# Patient Record
Sex: Male | Born: 1955 | Race: Black or African American | Hispanic: No | Marital: Single | Smoking: Light tobacco smoker
Health system: Southern US, Community
[De-identification: ages and names within clinical notes are randomized; demographics above are authoritative.]

## PROBLEM LIST (undated history)

## (undated) DIAGNOSIS — I10 Essential (primary) hypertension: Secondary | ICD-10-CM

## (undated) DIAGNOSIS — N189 Chronic kidney disease, unspecified: Secondary | ICD-10-CM

## (undated) DIAGNOSIS — E7800 Pure hypercholesterolemia, unspecified: Secondary | ICD-10-CM

## (undated) DIAGNOSIS — G931 Anoxic brain damage, not elsewhere classified: Secondary | ICD-10-CM

## (undated) DIAGNOSIS — N179 Acute kidney failure, unspecified: Secondary | ICD-10-CM

## (undated) DIAGNOSIS — I219 Acute myocardial infarction, unspecified: Secondary | ICD-10-CM

## (undated) DIAGNOSIS — I739 Peripheral vascular disease, unspecified: Secondary | ICD-10-CM

## (undated) DIAGNOSIS — J151 Pneumonia due to Pseudomonas: Secondary | ICD-10-CM

## (undated) DIAGNOSIS — E119 Type 2 diabetes mellitus without complications: Secondary | ICD-10-CM

## (undated) DIAGNOSIS — J9621 Acute and chronic respiratory failure with hypoxia: Secondary | ICD-10-CM

## (undated) DIAGNOSIS — I2510 Atherosclerotic heart disease of native coronary artery without angina pectoris: Secondary | ICD-10-CM

## (undated) HISTORY — PX: CORONARY ANGIOPLASTY: SHX604

## (undated) HISTORY — PX: NO PAST SURGERIES: SHX2092

## (undated) HISTORY — PX: TONSILLECTOMY: SUR1361

## (undated) HISTORY — DX: Peripheral vascular disease, unspecified: I73.9

## (undated) HISTORY — PX: CORONARY ANGIOPLASTY WITH STENT PLACEMENT: SHX49

---

## 2001-12-11 ENCOUNTER — Emergency Department (HOSPITAL_COMMUNITY): Admission: EM | Admit: 2001-12-11 | Discharge: 2001-12-11 | Payer: Self-pay | Admitting: *Deleted

## 2004-05-14 ENCOUNTER — Emergency Department (HOSPITAL_COMMUNITY): Admission: EM | Admit: 2004-05-14 | Discharge: 2004-05-14 | Payer: Self-pay | Admitting: Emergency Medicine

## 2004-05-19 ENCOUNTER — Ambulatory Visit: Payer: Self-pay | Admitting: Internal Medicine

## 2011-11-28 ENCOUNTER — Emergency Department (HOSPITAL_COMMUNITY)
Admission: EM | Admit: 2011-11-28 | Discharge: 2011-11-28 | Disposition: A | Discharge status: Home / Self Care | Payer: Self-pay | Attending: Emergency Medicine | Admitting: Emergency Medicine

## 2011-11-28 ENCOUNTER — Encounter (HOSPITAL_COMMUNITY): Payer: Self-pay | Admitting: *Deleted

## 2011-11-28 ENCOUNTER — Emergency Department (HOSPITAL_COMMUNITY): Payer: Self-pay

## 2011-11-28 DIAGNOSIS — S6290XA Unspecified fracture of unspecified wrist and hand, initial encounter for closed fracture: Secondary | ICD-10-CM

## 2011-11-28 DIAGNOSIS — S62309A Unspecified fracture of unspecified metacarpal bone, initial encounter for closed fracture: Secondary | ICD-10-CM | POA: Insufficient documentation

## 2011-11-28 DIAGNOSIS — Y9289 Other specified places as the place of occurrence of the external cause: Secondary | ICD-10-CM | POA: Insufficient documentation

## 2011-11-28 DIAGNOSIS — S139XXA Sprain of joints and ligaments of unspecified parts of neck, initial encounter: Secondary | ICD-10-CM | POA: Insufficient documentation

## 2011-11-28 DIAGNOSIS — M542 Cervicalgia: Secondary | ICD-10-CM | POA: Insufficient documentation

## 2011-11-28 DIAGNOSIS — X503XXA Overexertion from repetitive movements, initial encounter: Secondary | ICD-10-CM | POA: Insufficient documentation

## 2011-11-28 DIAGNOSIS — S161XXA Strain of muscle, fascia and tendon at neck level, initial encounter: Secondary | ICD-10-CM

## 2011-11-28 MED ORDER — NAPROXEN 500 MG PO TABS: 500.0000 mg | ORAL_TABLET | Freq: Two times a day (BID) | ORAL | Status: DC

## 2011-11-28 MED ORDER — HYDROCODONE-ACETAMINOPHEN 5-325 MG PO TABS: 1.0000 | ORAL_TABLET | Freq: Four times a day (QID) | ORAL | Status: AC | PRN

## 2011-11-28 NOTE — Progress Notes (Signed)
Orthopedic Tech Progress Note Patient Details:  Joel Hebert 08/13/1955 [id number]  Type of Splint: Volar;Other (comment) (foam arm sling) Splint Location: right hand/arm Splint Interventions: Application    Nikki Dom 11/28/2011, 6:29 PM

## 2011-11-28 NOTE — ED Provider Notes (Signed)
History   This chart was scribed for Shelda Jakes, MD by Brooks Sailors. The patient was seen in room STRE5/STRE5. Patient's care was started at 1421.   CSN: [id number]  Arrival date & time 11/28/11  1421   First MD Initiated Contact with Patient 11/28/11 1451      Chief Complaint  Patient presents with  . Joint Swelling    (Consider location/radiation/quality/duration/timing/severity/associated sxs/prior treatment) HPI Joel Hebert is a 56 y.o. male who presents to the Emergency Department complaining of constant Bilateral hand pain onset two months ago and persistent since with some swelling in the right hand. Pt says that he does a lot of heavy lifting at work and recently fell causing some neck pain. Denies history of arthritis. Pt says he was pulling on some metal when he slipped and fell and hurt his neck. Says it has been stiff ever since.     History reviewed. No pertinent past medical history.  History reviewed. No pertinent past surgical history.  No family history on file.  History  Substance Use Topics  . Smoking status: Current Everyday Smoker  . Smokeless tobacco: Not on file  . Alcohol Use: Yes     occ      Review of Systems  Constitutional: Negative for fever and chills.  HENT: Positive for neck pain. Negative for congestion and sore throat.   Respiratory: Negative for shortness of breath.   Cardiovascular: Negative for chest pain.  Gastrointestinal: Negative for nausea, vomiting, abdominal pain and diarrhea.  Genitourinary: Negative for dysuria and hematuria.  Musculoskeletal: Positive for joint swelling. Negative for myalgias and back pain.  Skin: Negative for rash.  Neurological: Negative for headaches.  All other systems reviewed and are negative.    Allergies  Review of patient's allergies indicates no known allergies.  Home Medications   Current Outpatient Rx  Name Route Sig Dispense Refill  . HYDROCODONE-ACETAMINOPHEN  5-325 MG PO TABS Oral Take 1-2 tablets by mouth every 6 (six) hours as needed for pain. 10 tablet 0  . NAPROXEN 500 MG PO TABS Oral Take 1 tablet (500 mg total) by mouth 2 (two) times daily. 14 tablet 0    BP 132/65  Pulse 84  Temp(Src) 98.3 F (36.8 C) (Oral)  Resp 18  SpO2 99%  Physical Exam  Nursing note and vitals reviewed. Constitutional: He is oriented to person, place, and time. He appears well-developed and well-nourished.  HENT:  Head: Normocephalic and atraumatic.  Eyes: Conjunctivae and EOM are normal. Pupils are equal, round, and reactive to light.  Neck: Normal range of motion. Neck supple.  Cardiovascular: Normal rate and regular rhythm.   Pulmonary/Chest: Effort normal and breath sounds normal.  Abdominal: Soft. Bowel sounds are normal.  Musculoskeletal: Normal range of motion.       Tenderness at bilateral MP joints, Cap refil 2 seconds or less both hands, Swelling palm of right hand, Radial pulse left and right is 2+.   Neurological: He is alert and oriented to person, place, and time.  Skin: Skin is warm and dry.  Psychiatric: He has a normal mood and affect.    ED Course  Procedures (including critical care time) Pt seen at 1513   Labs Reviewed - No data to display Dg Cervical Spine With Flex & Extend  11/28/2011  *RADIOLOGY REPORT*  Clinical Data: Neck pain.  CERVICAL SPINE COMPLETE WITH FLEXION AND EXTENSION VIEWS  Comparison: None.  Findings: Anterior spurring at C2-3, C4-5 and C5-6.  Disc space narrowing at C5-6.  Normal alignment.  No instability with flexion or extension.  No neural foraminal narrowing.  No fracture. Prevertebral soft tissues are normal.  IMPRESSION: Spondylosis.  No acute findings.  No instability.  Original Report Authenticated By: Cyndie Chime, M.D.   Dg Hand Complete Left  11/28/2011  *RADIOLOGY REPORT*  Clinical Data: Bilateral hand pain, fell March 2013  LEFT HAND - COMPLETE 3+ VIEW  Comparison: None  Findings: Osseous  mineralization grossly normal. Degenerative changes at IP joint left thumb joint space narrowing and spur formation. Remaining joint spaces preserved. No acute fracture, dislocation or bone destruction.  IMPRESSION: Degenerative changes IP joint left thumb. No definite acute bony abnormalities.  Original Report Authenticated By: Lollie Marrow, M.D.   Dg Hand Complete Right  11/28/2011  *RADIOLOGY REPORT*  Clinical Data: Fall.  Bilateral hand pain.  RIGHT HAND - COMPLETE 3+ VIEW  Comparison: None.  Findings: There are fractures noted at the base of the right fourth proximal phalanx and likely within the distal right third metacarpal.  Radiopaque density within the soft tissues at the tip of the right ring finger, age indeterminate.  Mild degenerative changes in the DIP joints and first carpal metacarpal joint.  IMPRESSION: Nondisplaced fractures within the distal right third metacarpal and base of the right fourth proximal phalanx.  Age indeterminate metallic foreign body in the tip of the right ring finger.  Original Report Authenticated By: Cyndie Chime, M.D.     1. Hand fracture   2. Cervical strain       MDM  Finding of fractures to the right hand without a history of trauma.  They have been splinted up with an ulnar gutter type splint and hand surgery referral provided.  Also workup of the cervical strain.  Her neck pain.  That occurred several weeks ago on the job shows no evidence of any bony injury or flexion and extension.  No obvious ligamental injury, our symptoms persist, patient may require MRI orthopedics consult with this.  Also, patient needs to talk to his workplace about possible workman compensation claim.      I personally performed the services described in this documentation, which was scribed in my presence. The recorded information has been reviewed and considered.     Shelda Jakes, MD 11/28/11 [phone]

## 2011-11-28 NOTE — ED Notes (Signed)
Pt has been having swelling to hands over the last 2 months and reports they throb.  Right hand is worse than left

## 2011-11-28 NOTE — Discharge Instructions (Signed)
Cast or Splint Care Casts and splints support injured limbs and keep bones from moving while they heal.  HOME CARE  Keep the cast or splint uncovered during the drying period.   A plaster cast can take 24 to 48 hours to dry.   A fiberglass cast will dry in less than 1 hour.   Do not rest the cast on anything harder than a pillow for 24 hours.   Do not put weight on your injured limb. Do not put pressure on the cast. Wait for your doctor's approval.   Keep the cast or splint dry.   Cover the cast or splint with a plastic bag during baths or wet weather.   If you have a cast over your chest and belly (trunk), take sponge baths until the cast is taken off.   Keep your cast or splint clean. Wash a dirty cast with a damp cloth.   Do not put any objects under your cast or splint. Do not scratch the skin under the cast with an object.   Do not take out the padding from inside your cast.   Exercise your joints near the cast as told by your doctor.   Raise (elevate) your injured limb on 1 or 2 pillows for the first 1 to 3 days.  GET HELP RIGHT AWAY IF:  Your cast or splint cracks.   Your cast or splint is too tight or too loose.   You itch badly under the cast.   Your cast gets wet or has a soft spot.   You have a bad smell coming from the cast.   You get an object stuck under the cast.   Your skin around the cast becomes red or raw.   You have new or more pain after the cast is put on.   You have fluid leaking through the cast.   You cannot move your fingers or toes.   Your fingers or toes turn colors or are cool, painful, or puffy (swollen).   You have tingling or lose feeling (numbness) around the injured area.   You have pain or pressure under the cast.   You have trouble breathing or have shortness of breath.   You have chest pain.  MAKE SURE YOU:  Understand these instructions.   Will watch your condition.   Will get help right away if you are not doing  well or get worse.  Document Released: 10/26/2010 Document Revised: 06/15/2011 Document Reviewed: 10/26/2010 [facility] Patient Information 2012 [place], [place].Cervical Sprain A cervical sprain is when the ligaments in the neck stretch or tear. The ligaments are the tissues that hold the neck bones in place. HOME CARE   Put ice on the injured area.   Put ice in a plastic bag.   Place a towel between your skin and the bag.   Leave the ice on for 15 to 20 minutes, 3 to 4 times a day.   Only take medicine as told by your doctor.   Keep all doctor visits as told.   Keep all physical therapy visits as told.   If your doctor gives you a neck collar, wear it as told.   Do not drive while wearing a neck collar.   Adjust your work station so that you have good posture while you work.   Avoid positions and activities that make your problems worse.   Warm up and stretch before being active.  GET HELP RIGHT AWAY IF:   You  are bleeding or your stomach is upset.   You have an allergic reaction to your medicine.   Your problems (symptoms) get worse.   You develop new problems.   You lose feeling (numbness) or you cannot move (paralysis) any part of your body.   You have tingling or weakness in any part of your body.   Your pain is not controlled with medicine.   You cannot take less pain medicine over time as planned.   Your activity level does not improve as expected.  MAKE SURE YOU:   Understand these instructions.   Will watch your condition.   Will get help right away if you are not doing well or get worse.  Document Released: 12/13/2007 Document Revised: 06/15/2011 Document Reviewed: 03/30/2011 [facility] Patient Information 2012 [place], [place].Cervical Sprain A cervical sprain is when the ligaments in the neck stretch or tear. The ligaments are the tissues that hold the neck bones in place. HOME CARE  Put ice on the injured area.  Put ice in a plastic bag.  Place a  towel between your skin and the bag.  Leave the ice on for 15 to 20 minutes, 3 to 4 times a day.  Only take medicine as told by your doctor.  Keep all doctor visits as told.  Keep all physical therapy visits as told.  If your doctor gives you a neck collar, wear it as told.  Do not drive while wearing a neck collar.  Adjust your work station so that you have good posture while you work.  Avoid positions and activities that make your problems worse.  Warm up and stretch before being active.  GET HELP RIGHT AWAY IF:  You are bleeding or your stomach is upset.  You have an allergic reaction to your medicine.  Your problems (symptoms) get worse.  You develop new problems.  You lose feeling (numbness) or you cannot move (paralysis) any part of your body.  You have tingling or weakness in any part of your body.  Your pain is not controlled with medicine.  You cannot take less pain medicine over time as planned.  Your activity level does not improve as expected.  MAKE SURE YOU:  Understand these instructions.  Will watch your condition.  Will get help right away if you are not doing well or get worse.  Document Released: 12/13/2007 Document Revised: 06/15/2011 Document Reviewed: 03/30/2011 [facility] Patient Information 2012 [place], [place].   Keep splint in place, and dry.  Use sling as needed.  For comfort.  Call orthopedic hand surgery for followup.  Take Naprosyn as needed and supplement with hydrocodone for more severe pain.  No use of your right hand until seen by hand surgery were given.  Note for that.  For the cervical strain.  Recommend followup with orthopedics Dr. were places that injury occurred at work.  Today's x-rays without any evidence of fracture or obvious ligamental injury

## 2012-03-05 ENCOUNTER — Emergency Department (HOSPITAL_COMMUNITY)
Admission: EM | Admit: 2012-03-05 | Discharge: 2012-03-05 | Disposition: A | Discharge status: Home / Self Care | Payer: Self-pay | Attending: Emergency Medicine | Admitting: Emergency Medicine

## 2012-03-05 ENCOUNTER — Encounter (HOSPITAL_COMMUNITY): Payer: Self-pay | Admitting: Emergency Medicine

## 2012-03-05 DIAGNOSIS — S1096XA Insect bite of unspecified part of neck, initial encounter: Secondary | ICD-10-CM | POA: Insufficient documentation

## 2012-03-05 DIAGNOSIS — L0390 Cellulitis, unspecified: Secondary | ICD-10-CM

## 2012-03-05 DIAGNOSIS — W57XXXA Bitten or stung by nonvenomous insect and other nonvenomous arthropods, initial encounter: Secondary | ICD-10-CM | POA: Insufficient documentation

## 2012-03-05 DIAGNOSIS — F17200 Nicotine dependence, unspecified, uncomplicated: Secondary | ICD-10-CM | POA: Insufficient documentation

## 2012-03-05 MED ORDER — CLINDAMYCIN HCL 300 MG PO CAPS: 300.0000 mg | ORAL_CAPSULE | Freq: Three times a day (TID) | ORAL | Status: AC

## 2012-03-05 MED ORDER — ACETAMINOPHEN 325 MG PO TABS
650.0000 mg | ORAL_TABLET | Freq: Once | ORAL | Status: AC
  Administered 2012-03-05: 650 mg via ORAL
  Filled 2012-03-05: qty 2

## 2012-03-05 MED ORDER — CLINDAMYCIN HCL 150 MG PO CAPS
300.0000 mg | ORAL_CAPSULE | Freq: Once | ORAL | Status: AC
  Administered 2012-03-05: 300 mg via ORAL
  Filled 2012-03-05: qty 2

## 2012-03-05 NOTE — ED Provider Notes (Signed)
History   This chart was scribed for Gerhard Munch, MD by Melba Coon. The patient was seen in room TR05C/TR05C and the patient's care was started at 2:31PM.    CSN: [id number]  Arrival date & time 03/05/12  1333   First MD Initiated Contact with Patient 03/05/12 1350      Chief Complaint  Patient presents with  . Insect Bite  . Nose Problem    The history is provided by the patient. No language interpreter was used.   Joel Hebert is a 56 y.o. male who presents to the Emergency Department complaining of persistent, moderate nose swelling and erythema with an onset 2 days ago. Pt states that the present symptoms may be from a spider bite. However, pt did not see what bit him. No pain meds have been taken. Swallowing, vision and behavior nml compared to baseline. HA present. No fever, neck pain, sore throat, rash, back pain, CP, SOB, abd pain, n/v/d, dysuria, or extremity pain, edema, weakness, numbness, or tingling. No known allergies. No other pertinent medical symptoms.   History reviewed. No pertinent past medical history.  History reviewed. No pertinent past surgical history.  History reviewed. No pertinent family history.  History  Substance Use Topics  . Smoking status: Current Everyday Smoker  . Smokeless tobacco: Not on file  . Alcohol Use: Yes     occ      Review of Systems 10 Systems reviewed and all are negative for acute change except as noted in the HPI.   Allergies  Review of patient's allergies indicates no known allergies.  Home Medications  No current outpatient prescriptions on file.  BP 157/77  Pulse 97  Temp 98.8 F (37.1 C) (Oral)  Resp 18  SpO2 96%  Physical Exam  Nursing note and vitals reviewed. Constitutional: He is oriented to person, place, and time. He appears well-developed and well-nourished.  HENT:  Head: Normocephalic and atraumatic.       Pain starting at the bridge of nose; indurated 2 cm area at distal portion  of nose, scabbed over previously open wound, punctate at the bottom.  Eyes: EOM are normal.  Neck: Normal range of motion. Neck supple.  Cardiovascular: Normal rate, normal heart sounds and intact distal pulses.   Pulmonary/Chest: Effort normal and breath sounds normal.  Abdominal: Bowel sounds are normal. He exhibits no distension. There is no tenderness.  Musculoskeletal: Normal range of motion. He exhibits no edema and no tenderness.  Neurological: He is alert and oriented to person, place, and time. He has normal strength. No cranial nerve deficit or sensory deficit.  Skin: Skin is warm and dry. No rash noted.  Psychiatric: He has a normal mood and affect.    ED Course  Procedures (including critical care time)  DIAGNOSTIC STUDIES: Oxygen Saturation is 96% on room air, adequate by my interpretation.    COORDINATION OF CARE:  2:35PM - tylenol and abx will be Rx for the pt. Pt advised to f/u with ED if symptoms worsen. Pt ready for d/c.   Labs Reviewed - No data to display No results found.   No diagnosis found.    MDM  I personally performed the services described in this documentation, which was scribed in my presence. The recorded information has been reviewed and considered.  The patient presents with 2 days of pain and swelling of the distal nose.  Absent fevers, chills, tachycardia, evidence of distress, and with the patient's description of increasing induration, there  suspicion of early cellulitis.  Patient was treated with oral antibiotics, given return precautions and discharged in stable condition.   Gerhard Munch, MD 03/05/12 1438

## 2012-03-05 NOTE — ED Notes (Signed)
Pt sts redness pain and swelling to nose x 4 days; pt sts thinks may have been bitten by something

## 2012-12-02 ENCOUNTER — Ambulatory Visit (HOSPITAL_COMMUNITY)
Admission: RE | Admit: 2012-12-02 | Discharge: 2012-12-02 | Disposition: A | Discharge status: Home / Self Care | Payer: No Typology Code available for payment source | Source: Ambulatory Visit | Attending: Family Medicine | Admitting: Family Medicine

## 2012-12-02 ENCOUNTER — Other Ambulatory Visit (HOSPITAL_COMMUNITY): Payer: Self-pay | Admitting: Family Medicine

## 2012-12-02 DIAGNOSIS — M25511 Pain in right shoulder: Secondary | ICD-10-CM

## 2014-08-13 DIAGNOSIS — R079 Chest pain, unspecified: Secondary | ICD-10-CM

## 2014-08-13 DIAGNOSIS — F17210 Nicotine dependence, cigarettes, uncomplicated: Secondary | ICD-10-CM

## 2016-01-20 ENCOUNTER — Encounter (INDEPENDENT_AMBULATORY_CARE_PROVIDER_SITE_OTHER): Payer: Self-pay

## 2016-01-20 ENCOUNTER — Encounter (INDEPENDENT_AMBULATORY_CARE_PROVIDER_SITE_OTHER): Payer: Self-pay | Admitting: Cardiovascular Disease

## 2016-01-20 ENCOUNTER — Ambulatory Visit (INDEPENDENT_AMBULATORY_CARE_PROVIDER_SITE_OTHER): Payer: No Typology Code available for payment source | Admitting: Cardiovascular Disease

## 2016-01-20 VITALS — BP 165/96 | HR 89 | Ht 68.0 in | Wt 211.0 lb

## 2016-01-20 DIAGNOSIS — I10 Essential (primary) hypertension: Secondary | ICD-10-CM

## 2016-01-20 DIAGNOSIS — R739 Hyperglycemia, unspecified: Secondary | ICD-10-CM | POA: Insufficient documentation

## 2016-01-20 DIAGNOSIS — Z720 Tobacco use: Secondary | ICD-10-CM

## 2016-01-20 DIAGNOSIS — R079 Chest pain, unspecified: Secondary | ICD-10-CM | POA: Insufficient documentation

## 2016-01-20 HISTORY — DX: Essential (primary) hypertension: I10

## 2016-01-20 HISTORY — DX: Chest pain, unspecified: R07.9

## 2016-01-20 MED ORDER — AMLODIPINE BESYLATE 10 MG PO TABS
5.0000 mg | ORAL_TABLET | Freq: Every day | ORAL | Status: AC
Start: 2016-01-20 — End: 2016-04-19

## 2016-01-20 NOTE — Progress Notes (Signed)
[facility]   Cardiology    Patient Name:  Dustin Leon  Date: 01/20/2016  Patient Number:  [id number]  DOB:  [birth year]  Gender: male    Chief Complaint   Patient presents with   . Hypertension     Started at [facility] 3 days ago.   . Follow-up     Chest pain started 3 days ago,  described as a midsternal pain that lasted for 3 hours, pain level 8/10.       Assessment and Plan  Was in ER with CP and elevated BP ,high BG 223   Was put on Norvasc 5 qd   dced to follow up as outpt   CP was pressure like ,in MS area ,non-radiating ,resolved a few seconds after the 3rd dose of S/L NTG  BP is still high on Norvasc 5 qd  Increase Norvasc 10 qd   Check A1c and Lipids   Ischemic w/u   Complete smoking cessation  Encounter Diagnoses   Name Primary?   . Chest pain, unspecified type Yes   . HTN (hypertension), benign    . Hyperglycemia    . Tobacco abuse         Orders Placed This Encounter   Procedures   . NM Myocardial Perfusion Spect (Stress And Rest)     Standing Status: Future      Number of Occurrences:       Standing Expiration Date: 01/20/2017     Order Specific Question:  Clinical info for radiologist     Answer:  cp   . Hepatic function panel (LFT)     Standing Status: Future      Number of Occurrences:       Standing Expiration Date: 01/20/2017   . Lipid panel     Fasting     Standing Status: Future      Number of Occurrences:       Standing Expiration Date: 01/20/2017     Order Specific Question:  Has the patient fasted?     Answer:  Yes   . Hemoglobin A1C   . Echocardiogram Adult Complete W Clr/ Dopp Waveform     Order Specific Question:  Which group should read the exam?     Answer:  N/A       History of Present Illness   Was in ER with CP and elevated BP ,high BG 223   Was put on Norvasc 5 qd   dced to follow up as outpt   CP was pressure like ,in MS area ,non-radiating ,resolved a few seconds after the 3rd dose of S/L NTG  BP is still high on Norvasc 5 qd  No Known Allergies    Past Medical History   Diagnosis  Date   . Chest pain 01/20/2016   . HTN (hypertension), benign 01/20/2016       Past Surgical History   Procedure Laterality Date   . No past surgeries         Family History   Problem Relation Age of Onset   . Hypertension Mother    . Diabetes Mother    . Kidney disease Mother    . CABG Father 57   . CABG Brother 55     x 3             Medications:   Current Outpatient Prescriptions   Medication Sig Dispense Refill   . amLODIPine (NORVASC) 10 MG tablet Take 0.5 tablets (5 mg total)  by mouth daily. 90 tablet 3     No current facility-administered medications for this visit.       Physical Exam:     Filed Vitals:    01/20/16 1436   BP: 165/96   Pulse: 89   Height: 1.727 m (5\' 8" )   Weight: 95.709 kg (211 lb)   SpO2: 97%   Body surface area is 2.14 meters squared.  Body mass index is 32.09 kg/(m^2).    Constitutional: well developed, nourished, no distress and alert and oriented x 3   HENT: atraumatic, nose normal, normocephalic, left exterior ear normal, right external ear normal and oropharynx clear and moist   Eyes: conjunctiva normal, EOM normal and PERRL   Neck: ROM normal, supple and trachea normal   Vascular WNL, no edema, normal pulses , no cellulitis   Cardiovascular: heart sounds normal, intact distal pulses, normal rate, regular rhythm and no murmur   Pulmonary/Chest Wall: breath sounds normal, effort normal and no rales   Abdominal: appearance normal, bowel sounds normal, soft and non-tender   Musculoskeletal: normal ROM, normal tone and strength   Neurological: awake, alert and oriented x 3 and gait normal, cranial nerves intact, motor and sensory normal   Skin: dry, intact and warm     LABS:  Lipid Panel No results found for: CHOL, TRIG, HDL  CBC No results found for: WBC, HGB, HCT, MCV, PLT   BMPNo results found for: GLUCOSE, SODIUM, POTASSIUM, CHLORIDE, CO2, BUN, CREATININE  INR No results found for: INR, PROTIME    REVIEW OF SYSTEMS: All other systems reviewed and negative except as stated  above.    Electronically signed by: Miachel Roux, MD 01/20/2016 3:11 PM

## 2016-01-27 ENCOUNTER — Encounter (INDEPENDENT_AMBULATORY_CARE_PROVIDER_SITE_OTHER): Payer: Self-pay | Admitting: Cardiovascular Disease

## 2016-01-27 DIAGNOSIS — I214 Non-ST elevation (NSTEMI) myocardial infarction: Secondary | ICD-10-CM

## 2016-01-27 DIAGNOSIS — R079 Chest pain, unspecified: Secondary | ICD-10-CM

## 2016-01-28 DIAGNOSIS — R079 Chest pain, unspecified: Secondary | ICD-10-CM

## 2016-01-28 DIAGNOSIS — R0789 Other chest pain: Secondary | ICD-10-CM

## 2016-01-28 DIAGNOSIS — I214 Non-ST elevation (NSTEMI) myocardial infarction: Secondary | ICD-10-CM

## 2016-01-31 ENCOUNTER — Telehealth (INDEPENDENT_AMBULATORY_CARE_PROVIDER_SITE_OTHER): Payer: Self-pay

## 2016-01-31 NOTE — Telephone Encounter (Signed)
Left VM for patient to call back regarding Nuclear MPI preparation.  ??? Validity of phone number...  [phone] (H)  01/31/2016 @ 1110 mai

## 2016-02-02 ENCOUNTER — Ambulatory Visit (INDEPENDENT_AMBULATORY_CARE_PROVIDER_SITE_OTHER): Payer: No Typology Code available for payment source

## 2016-02-02 ENCOUNTER — Encounter (INDEPENDENT_AMBULATORY_CARE_PROVIDER_SITE_OTHER): Payer: Self-pay

## 2016-02-02 NOTE — Progress Notes (Signed)
Patient arrived for Nuclear MPI test.  He stated to me that he had just had a stent placed.  I verified it was performed at [place], and [place], RN pulled the information.  We showed Dr. Aneta Mins, and explained that since the patient's office visit on 01/20/16, patient went to [facility], and had Cath performed by Dr. Billie Ruddy.  The patient was now here in the office for the previously scheduled Nuclear, and we wanted to verify the medical need at this point. Dr. Aneta Mins confirmed that the exam did not need to be performed.  Patient requested a return to work form, and Dr. Aneta Mins will write one, at the next office visit, this week, overbooked as necessary.

## 2016-02-07 ENCOUNTER — Telehealth (INDEPENDENT_AMBULATORY_CARE_PROVIDER_SITE_OTHER): Payer: Self-pay | Admitting: Cardiovascular Disease

## 2016-02-07 ENCOUNTER — Ambulatory Visit (INDEPENDENT_AMBULATORY_CARE_PROVIDER_SITE_OTHER): Payer: No Typology Code available for payment source | Admitting: Cardiovascular Disease

## 2016-02-07 ENCOUNTER — Encounter (INDEPENDENT_AMBULATORY_CARE_PROVIDER_SITE_OTHER): Payer: Self-pay | Admitting: Cardiovascular Disease

## 2016-02-07 ENCOUNTER — Encounter (INDEPENDENT_AMBULATORY_CARE_PROVIDER_SITE_OTHER): Payer: Self-pay

## 2016-02-07 VITALS — BP 158/81 | HR 76 | Ht 68.0 in | Wt 207.4 lb

## 2016-02-07 DIAGNOSIS — I2510 Atherosclerotic heart disease of native coronary artery without angina pectoris: Secondary | ICD-10-CM

## 2016-02-07 DIAGNOSIS — Z955 Presence of coronary angioplasty implant and graft: Secondary | ICD-10-CM

## 2016-02-07 DIAGNOSIS — E7800 Pure hypercholesterolemia, unspecified: Secondary | ICD-10-CM

## 2016-02-07 DIAGNOSIS — Z720 Tobacco use: Secondary | ICD-10-CM

## 2016-02-07 DIAGNOSIS — I10 Essential (primary) hypertension: Secondary | ICD-10-CM

## 2016-02-07 DIAGNOSIS — I214 Non-ST elevation (NSTEMI) myocardial infarction: Secondary | ICD-10-CM | POA: Insufficient documentation

## 2016-02-07 DIAGNOSIS — E119 Type 2 diabetes mellitus without complications: Secondary | ICD-10-CM | POA: Insufficient documentation

## 2016-02-07 DIAGNOSIS — R739 Hyperglycemia, unspecified: Secondary | ICD-10-CM

## 2016-02-07 DIAGNOSIS — Z95820 Peripheral vascular angioplasty status with implants and grafts: Secondary | ICD-10-CM

## 2016-02-07 DIAGNOSIS — Z959 Presence of cardiac and vascular implant and graft, unspecified: Secondary | ICD-10-CM

## 2016-02-07 HISTORY — DX: Presence of coronary angioplasty implant and graft: Z95.5

## 2016-02-07 HISTORY — DX: Peripheral vascular angioplasty status with implants and grafts: Z95.820

## 2016-02-07 HISTORY — DX: Non-ST elevation (NSTEMI) myocardial infarction: I21.4

## 2016-02-07 HISTORY — DX: Atherosclerotic heart disease of native coronary artery without angina pectoris: I25.10

## 2016-02-07 MED ORDER — LISINOPRIL 10 MG PO TABS
10.0000 mg | ORAL_TABLET | Freq: Every day | ORAL | Status: DC
Start: 2016-02-07 — End: 2016-04-06

## 2016-02-07 NOTE — Progress Notes (Signed)
[facility]   Cardiology    Patient Name:  Dustin Leon  Date: 02/07/2016  Patient Number:  [id number]  DOB:  [birth year]  Gender: male    Chief Complaint   Patient presents with   . Hypertension     increased Norvac at last visit.    . Coronary Artery Disease     Post NSTEMI and stent 1 week ago, DES to the LAD.        Assessment and Plan  Had recurrent CP with ACS ,had DES to OM ,EF 55%  No recurrent CP  Still smokes some cigar  Is compliant with meds  Cardiac rehab  Complete smoking cessation  Increase Lisinoprl 5 to 10 qd   Encounter Diagnoses   Name Primary?   . HTN (hypertension), benign Yes   . Tobacco abuse  Smoking cessation was discussed with patient and provided information    . Coronary artery disease involving native coronary artery of native heart without angina pectoris  CAD Therapeutic goals discussed with patient and provided information      . S/P angioplasty with stent    . NSTEMI (non-ST elevated myocardial infarction)    . Presence of stent in LAD coronary artery    . Presence of drug coated stent in left circumflex coronary artery    . Hyperglycemia         Orders Placed This Encounter   Procedures   . Referral to Cardiac Rehabilitation     Referral Priority:  Routine     Referral Type:  Rehabilitation     Referral Reason:  Specialty Services Required     Requested Specialty:  Cardiology     Number of Visits Requested:  1   . ECG 12 lead (Today)       History of Present Illness   Had recurrent CP with ACS ,had DES to OM ,EF 55%  No recurrent CP  Still smokes some cigar  Is compliant with meds    No Known Allergies    Past Medical History   Diagnosis Date   . Chest pain 01/20/2016   . HTN (hypertension), benign 01/20/2016   . Coronary artery disease involving native coronary artery of native heart without angina pectoris 02/07/2016   . S/P angioplasty with stent 02/07/2016   . NSTEMI (non-ST elevated myocardial infarction) 02/07/2016   . Presence of stent in LAD coronary artery 02/07/2016        Past Surgical History   Procedure Laterality Date   . No past surgeries     . Coronary angioplasty         Family History   Problem Relation Age of Onset   . Hypertension Mother    . Diabetes Mother    . Kidney disease Mother    . CABG Father 37   . CABG Brother 55     x 3       No images are attached to the encounter.    Medications:   Current Outpatient Prescriptions   Medication Sig Dispense Refill   . amLODIPine (NORVASC) 10 MG tablet Take 0.5 tablets (5 mg total) by mouth daily. (Patient taking differently: Take 10 mg by mouth daily.    ) 90 tablet 3   . aspirin (GOODSENSE ASPIRIN) 81 MG chewable tablet Chew 81 mg by mouth daily.         Marland Kitchen atorvastatin (LIPITOR) 40 MG tablet Take 40 mg by mouth daily.         Marland Kitchen  glipiZIDE (GLUCOTROL) 10 MG tablet Take 10 mg by mouth 2 (two) times daily before meals.         Marland Kitchen lisinopril (PRINIVIL,ZESTRIL) 10 MG tablet Take 1 tablet (10 mg total) by mouth daily. 90 tablet 3   . metFORMIN (GLUCOPHAGE) 850 MG tablet Take 850 mg by mouth 2 (two) times daily with meals.         . metoprolol (LOPRESSOR) 25 MG tablet Take 25 mg by mouth 2 (two) times daily.         . nitroglycerin (NITROSTAT) 0.4 MG SL tablet Place 0.4 mg under the tongue every 5 (five) minutes as needed.         . ticagrelor (BRILINTA) 90 MG Tab Take 90 mg by mouth every 12 (twelve) hours.           No current facility-administered medications for this visit.       Physical Exam:     Filed Vitals:    02/07/16 0850   BP: 158/81   Pulse: 76   Height: 1.727 m (5\' 8" )   Weight: 94.076 kg (207 lb 6.4 oz)   Body surface area is 2.12 meters squared.  Body mass index is 31.54 kg/(m^2).    Constitutional: well developed, nourished, no distress and alert and oriented x 3   HENT: atraumatic, nose normal, normocephalic, left exterior ear normal, right external ear normal and oropharynx clear and moist   Eyes: conjunctiva normal, EOM normal and PERRL   Neck: ROM normal, supple and trachea normal   Vascular WNL, no edema,  normal pulses , no cellulitis   Cardiovascular: heart sounds normal, intact distal pulses, normal rate, regular rhythm and no murmur   Pulmonary/Chest Wall: breath sounds normal, effort normal and no rales   Abdominal: appearance normal, bowel sounds normal, soft and non-tender   Musculoskeletal: normal ROM, normal tone and strength   Neurological: awake, alert and oriented x 3 and gait normal, cranial nerves intact, motor and sensory normal   Skin: dry, intact and warm     LABS:  Lipid Panel No results found for: CHOL, TRIG, HDL  CBC No results found for: WBC, HGB, HCT, MCV, PLT   BMPNo results found for: GLUCOSE, SODIUM, POTASSIUM, CHLORIDE, CO2, BUN, CREATININE  INR No results found for: INR, PROTIME    REVIEW OF SYSTEMS: All other systems reviewed and negative except as stated above.    Electronically signed by: Miachel Roux, MD 02/07/2016 9:36 AM

## 2016-02-07 NOTE — Telephone Encounter (Signed)
Dr. Aneta Mins would like patient enrolled in cardiac rehab at [facility]

## 2016-02-09 ENCOUNTER — Telehealth (INDEPENDENT_AMBULATORY_CARE_PROVIDER_SITE_OTHER): Payer: Self-pay | Admitting: Cardiovascular Disease

## 2016-02-09 ENCOUNTER — Encounter (INDEPENDENT_AMBULATORY_CARE_PROVIDER_SITE_OTHER): Payer: Self-pay

## 2016-02-09 NOTE — Telephone Encounter (Signed)
Order for Cardiac Rehab scanned into Media tab listed as Physician Order. Was unable to label as Cardiac Rehab Order so please be aware that this has been submitted

## 2016-02-10 NOTE — Telephone Encounter (Signed)
Order faxed and scanned     [name]

## 2016-02-17 ENCOUNTER — Encounter (INDEPENDENT_AMBULATORY_CARE_PROVIDER_SITE_OTHER): Payer: Self-pay

## 2016-02-23 ENCOUNTER — Ambulatory Visit (INDEPENDENT_AMBULATORY_CARE_PROVIDER_SITE_OTHER): Payer: No Typology Code available for payment source

## 2016-02-23 DIAGNOSIS — R739 Hyperglycemia, unspecified: Secondary | ICD-10-CM

## 2016-02-23 DIAGNOSIS — I10 Essential (primary) hypertension: Secondary | ICD-10-CM

## 2016-02-23 DIAGNOSIS — R079 Chest pain, unspecified: Secondary | ICD-10-CM

## 2016-03-08 ENCOUNTER — Encounter (INDEPENDENT_AMBULATORY_CARE_PROVIDER_SITE_OTHER): Payer: Self-pay | Admitting: Cardiovascular Disease

## 2016-03-08 ENCOUNTER — Ambulatory Visit (INDEPENDENT_AMBULATORY_CARE_PROVIDER_SITE_OTHER): Payer: No Typology Code available for payment source | Admitting: Cardiovascular Disease

## 2016-03-08 VITALS — BP 132/73 | HR 83 | Ht 68.0 in | Wt 208.0 lb

## 2016-03-08 DIAGNOSIS — I200 Unstable angina: Secondary | ICD-10-CM | POA: Insufficient documentation

## 2016-03-08 DIAGNOSIS — Z955 Presence of coronary angioplasty implant and graft: Secondary | ICD-10-CM

## 2016-03-08 DIAGNOSIS — E7800 Pure hypercholesterolemia, unspecified: Secondary | ICD-10-CM

## 2016-03-08 DIAGNOSIS — I2510 Atherosclerotic heart disease of native coronary artery without angina pectoris: Secondary | ICD-10-CM

## 2016-03-08 DIAGNOSIS — I25118 Atherosclerotic heart disease of native coronary artery with other forms of angina pectoris: Secondary | ICD-10-CM

## 2016-03-08 MED ORDER — TICAGRELOR 90 MG PO TABS
90.0000 mg | ORAL_TABLET | Freq: Two times a day (BID) | ORAL | Status: AC
Start: 2016-03-08 — End: ?

## 2016-03-08 NOTE — Progress Notes (Signed)
Maceo Pro MD [facility]  [place] [facility]  [address] [id number]  [place] [facility] - [facility]  [address] [place]  [address]  [place] [address]  Dept: [phone]  Dept Fax: [phone]  Loc Appt: [phone]  Loc: [phone]  Loc Fax: [phone]      Patient Name:  Dustin Leon  Date: 03/08/2016  Patient Number:  [id number]  DOB:  09-05-1955  Gender: male  Chief Complaint   Patient presents with   . Results     echo       Dear Pedro Earls, Lily Lovings, MD  I had the pleasure of seeing Dustin Leon  followup please see my enclosed note for this patient's management.    Thank you for the opportunity to participate in the care of your patients.  If you have any questions please do not hesitate to contact me.      Assessment:  1. Coronary artery disease of native artery of native heart with stable angina pectoris echo revealed normal LV systolic function   2. Pure hypercholesterolemia statin therapy goal LDL < 70    3. Presence of drug coated stent in left circumflex coronary artery continue brilinta and DAPT dual antiplatelet therapy    4. Coronary artery disease involving native coronary artery of native heart without angina pectoris    5.  angina pectoris stress test continue other meds        Plan:    Orders Placed This Encounter   Procedures   . Exercise Nuclear Stress Test       1. Reviewed Current/Prior ECG, Prior Cardiovasuclar workup reviewed with the patient., Educated regarding Vascular, Structural, and Rhythm Heart Disease States and how that relates to primary and secondary prevention of  heart disease or Counseled importance of weight loss to cardiovascular mortality and effective dieting strategies.    History of Present Illness   Chest Pain  Dustin Leon complains of chest pain. Onset was 1 day ago. Symptoms have improved since that time. The patient's pain is associated with activities. The patient describes the pain as dull, pressure and does not radiate. Patient rates pain as  a 1/10 in intensity. Associated symptoms are: chest pain and chest pressure/discomfort. Aggravating factors are: exercise. Alleviating factors are: rest. Patient's cardiac risk factors are: none. Patient's risk factors for DVT/PE: none. Previous cardiac testing: coronary angiography, date [facility], location stent to the lcx.      Allergies, Problem List, Past medical History, Social History  Family History,  Medications,  Reviewed  @DATE @    No Known Allergies    Past Medical History   Diagnosis Date   . Chest pain 01/20/2016   . HTN (hypertension), benign 01/20/2016   . Coronary artery disease involving native coronary artery of native heart without angina pectoris 02/07/2016   . S/P angioplasty with stent 02/07/2016   . NSTEMI (non-ST elevated myocardial infarction) 02/07/2016   . Presence of stent in LAD coronary artery 02/07/2016       Past Surgical History   Procedure Laterality Date   . No past surgeries     . Coronary angioplasty         Family History   Problem Relation Age of Onset   . Hypertension Mother    . Diabetes Mother    . Kidney disease Mother    . CABG Father 50   . CABG Brother 55     x 3       Medications:   Current Outpatient  Prescriptions   Medication Sig   . amLODIPine (NORVASC) 10 MG tablet Take 0.5 tablets (5 mg total) by mouth daily. (Patient taking differently: Take 10 mg by mouth daily.    )   . aspirin (GOODSENSE ASPIRIN) 81 MG chewable tablet Chew 81 mg by mouth daily.       Marland Kitchen atorvastatin (LIPITOR) 40 MG tablet Take 40 mg by mouth daily.       Marland Kitchen glipiZIDE (GLUCOTROL) 10 MG tablet Take 10 mg by mouth 2 (two) times daily before meals.       Marland Kitchen lisinopril (PRINIVIL,ZESTRIL) 10 MG tablet Take 1 tablet (10 mg total) by mouth daily.   . metFORMIN (GLUCOPHAGE) 850 MG tablet Take 850 mg by mouth 2 (two) times daily with meals.       . metoprolol (LOPRESSOR) 25 MG tablet Take 25 mg by mouth 2 (two) times daily.       . nitroglycerin (NITROSTAT) 0.4 MG SL tablet Place 0.4 mg under the tongue every 5  (five) minutes as needed.       . ticagrelor (BRILINTA) 90 MG Tab Take 1 tablet (90 mg total) by mouth every 12 (twelve) hours.       Physical Exam:   General Appearance:  Alert, well-appearing male in no acute distress.    Vital Signs Reviewed  Vital Signs: BP 132/73 mmHg  Pulse 83  Ht 1.727 m (5\' 8" )  Wt 94.348 kg (208 lb)  BMI 31.63 kg/m2  SpO2 98%   HEENT: Sclera anicteric, conjunctiva without pallor, moist mucous membranes, normal dentition. No arcus.   Neck:  Supple without jugular venous distention. Thyroid nonpalpable. Normal carotid upstroke without bruits.   Chest: clear to auscultation bilaterally without wheezes, rales, or rhonchi  Cardiovascular: regular rhythm, soft heart sounds, S1/S2 normal and no murmurs, gallops or rubs are appreciated  Abdomen: Soft, nontender, nondistended, with normoactive bowel sounds. No organomegaly.  No pulsatile masses, or bruits.   Extremities:  peripheral pulses full and equal, warm, no cyanosis or clubbing and no edema    ROS:   General: no acute distress No weight loss   Skin: No bruising   Respiratory: per HPI  Cardiovascular: per HPI  GI: No blood in the stools. No nausea or vomiting.  Neuro: no focal deficits  Musculoskeletal no arthralgias or joint swelling    Results for orders placed in visit on 01/20/16   Echocardiogram Adult Complete W Clr/ Dopp Waveform    Narrative [facility] - [place]  [address] [place]  [address]                     [1]logo  [place] [address]  [phone]    Location:               [facility] - [place] [place]                                       MRN:    [id number] Date of Service: 02/23/2016  Patient Name:           Dustin Leon, Dustin Leon  DOB:    01/17/1956 Height:          68.0in  Interpreting Physician: Miachel Roux, MD                                                   Gender: M        Weight:          207lb  Sonographer:            Bevelyn Ngo, RDCS                                                     Age:    81       BSA:             2.42m2  Indication:             Chest pain, unspecified typeHTN (hypertension), benignHyperglycemia BP:     158/81  Study Quality: Technically fair.    Referring Physician: MAFISHAHRYAR    _______________________________________________________________________________________________  Echocardiogram Report      Conclusions: 1. Normal left and right ventricular size and function.                 2. Estimated ejection fraction 65%.                 3. Mild concentric left ventricular hypertrophy.                 4. Trace aortic regurgitation.                 5. Trace mitral regurgitation.                 6. Trace tricuspid regurgitation.                 7. no significant change compared to the previous echo done in [place] on 01/28/2016                 8. Technically fair study  Findings:  Left Ventricle:  Normal left ventricular size. Normal left ventricular systolic function.65% No regional wall motion abnormalities noted. Mild concentric left ventricular hypertrophy. Normal left ventricular diastolic function.  Right Ventricle: Normal right ventricular size and function.  Left Atrium:     Normal left atrial size.  Right Atrium:    Normal right atrial size.  Mitral Valve:    Trace mitral regurgitation.  Tricuspid Valve: Trace tricuspid regurgitation. Pulmonary arterial systolic pressure is estimated to be with a right atrial pressure of .  Aortic Valve:    The aortic valve is trileaflet. Mild aortic valve calcification. Trace aortic regurgitation. No aortic stenosis.  Pulmonic Valve:  No significant pulmonic regurgitation.  Aorta:           Aortic root and proximal ascending aorta are normal in size.  Pericardium:     No pericardial effusion.  IVC:             The inferior vena cava is normal in size with normal respirophasic variation.    Electronically signed Miachel Roux, MD 02/23/16 5:15  PM    _______________________________________________________________________________________________  STUDY DATA  _______________________________________________________________________________________________    Location:  [facility] [place] [place]                                       MRN:    [id number]  Patient Name:           Dustin Leon, Dustin Leon                                                       DOB:    January 11, 1956 Date of Service: 02/23/2016  Interpreting Physician: Miachel Roux, MD                                                   Gender: M        Height:          68.0in  Sonographer:            Bevelyn Ngo, RDCS                                                    Age:    60       Weight:          207lb  Indication:             Chest pain, unspecified typeHTN (hypertension), benignHyperglycemia BP:     158/81    ------- Left Ventricle ------ ------------ Aorta            ----------- -------- Mitral Valve -------  LVIDd:     4.47(3.7-5.6)cm  Aortic Root:           2.41(2.3-3.7)cm  LVIDs:     2.9(2.0-3.5)cm  IVSDd:     1.2(0.6-1.1)cm   Prox. Ascending Aorta: 2.56(2.3-3.7)cm  LVPW:      1.2(0.6-1.1)cm   ------------ Aortic Valve     -----------  EF:        65(50-75)%       Peak:                  1.54(<=2.5)m/s  FS:        33%               Peak Grad:             9.48(<=16)mmHg  -------- Left Atrium --------                                           PHT:          61.73ms  LA:        3.18(1.9-4.0)cm                                            MVA by PHT:   3.57(4-6)cm2  AVA(vel):              2.47(2-5)cm2  -------- Right Heart --------  RVIDd:     3.14(0.7-4.1)cm  LVOT Diam:             2.11(1.8-2.4)cm                                LVOT PV:               1.09(0.7-1.1)m/s                                LVOT PG:               4.60mmHg          ------ Tricuspid Valve ------  ----- Diastolic Function ----                                            TR Peak Vel:  2.88m/s  Peak E:    0.86(0.6-1.3)m/s ------------ Pulmonic Valve   ----------- TR Peak Grad:  Peak A:    0.94(<=0.7)m/s                                             RAP:          5(<=5)mmHg  E/A Ratio: 0.91(0.75-1.5)                                             RVSP:         25(<=36)mmHg  DT:        212.31(<=200)ms                                           IVC:          1.2(<=2.1)cm    Report for Dustin Leon [id number] on 02/23/16    References    Visible links       No results found for this or any previous visit.    Electronically signed by: Willaim Sheng, MD 03/08/2016 5:00 PM

## 2016-03-16 ENCOUNTER — Telehealth (INDEPENDENT_AMBULATORY_CARE_PROVIDER_SITE_OTHER): Payer: Self-pay

## 2016-03-16 NOTE — Telephone Encounter (Signed)
Left VM for patient to call back regarding Nuclear MPI preparation.  03/16/2016 @ 1110 mai

## 2016-03-20 ENCOUNTER — Ambulatory Visit (INDEPENDENT_AMBULATORY_CARE_PROVIDER_SITE_OTHER): Payer: No Typology Code available for payment source

## 2016-03-20 ENCOUNTER — Encounter (INDEPENDENT_AMBULATORY_CARE_PROVIDER_SITE_OTHER): Payer: Self-pay

## 2016-03-20 DIAGNOSIS — I25118 Atherosclerotic heart disease of native coronary artery with other forms of angina pectoris: Secondary | ICD-10-CM

## 2016-03-20 MED ORDER — TECHNETIUM TC 99M TETROFOSMIN INJECTION
1.0000 | Freq: Once | Status: AC | PRN
Start: 2016-03-20 — End: 2016-03-20
  Administered 2016-03-20: 1 via INTRAVENOUS

## 2016-03-24 ENCOUNTER — Telehealth (INDEPENDENT_AMBULATORY_CARE_PROVIDER_SITE_OTHER): Payer: Self-pay | Admitting: Cardiovascular Disease

## 2016-03-24 ENCOUNTER — Ambulatory Visit (INDEPENDENT_AMBULATORY_CARE_PROVIDER_SITE_OTHER): Payer: No Typology Code available for payment source | Admitting: Cardiovascular Disease

## 2016-03-24 ENCOUNTER — Encounter (INDEPENDENT_AMBULATORY_CARE_PROVIDER_SITE_OTHER): Payer: Self-pay | Admitting: Cardiovascular Disease

## 2016-03-24 ENCOUNTER — Ambulatory Visit: Admit: 2016-03-24 | Payer: Self-pay | Admitting: Cardiovascular Disease

## 2016-03-24 VITALS — BP 170/91 | HR 93 | Ht 68.0 in | Wt 205.0 lb

## 2016-03-24 DIAGNOSIS — I200 Unstable angina: Secondary | ICD-10-CM

## 2016-03-24 DIAGNOSIS — I10 Essential (primary) hypertension: Secondary | ICD-10-CM

## 2016-03-24 DIAGNOSIS — E7800 Pure hypercholesterolemia, unspecified: Secondary | ICD-10-CM

## 2016-03-24 DIAGNOSIS — I2510 Atherosclerotic heart disease of native coronary artery without angina pectoris: Secondary | ICD-10-CM

## 2016-03-24 DIAGNOSIS — Z955 Presence of coronary angioplasty implant and graft: Secondary | ICD-10-CM

## 2016-03-24 SURGERY — LEFT HEART CATH POSS PCI
Anesthesia: Conscious Sedation | Laterality: Left

## 2016-03-24 MED ORDER — ISOSORBIDE MONONITRATE ER 30 MG PO TB24
30.0000 mg | ORAL_TABLET | Freq: Every day | ORAL | 8 refills | Status: AC
Start: 2016-03-24 — End: 2016-06-22

## 2016-03-24 NOTE — Telephone Encounter (Signed)
Patient needs a cath ASAP at [facility] preferably or [facility] which ever is available first with Dr Billie Ruddy    Lab orders given to pt.

## 2016-03-24 NOTE — Progress Notes (Signed)
Maceo Pro MD [facility]  [facility]  [facility] [place] [phone]  [place] [facility] - [facility]  [address] [place]  [address]  [place] [address]  Dept: [phone]  Dept Fax: [phone]  Loc Appt: [phone]  Loc: [phone]  Loc Fax: [phone]      Patient Name:  Despina Pole  Date: 03/24/2016  Patient Number:  [id number]  DOB:  05-12-1956  Gender: male  Chief Complaint   Patient presents with   . Patient Results     nuclear stress test results        Dear Pedro Earls, Lily Lovings, MD  I had the pleasure of seeing Kenji Mapel  followup please see my enclosed note for this patient's management.    Thank you for the opportunity to participate in the care of your patients.  If you have any questions please do not hesitate to contact me.      Assessment:  1. Coronary artery disease involving native coronary artery of native heart without angina pectoris added nitrates to the antianginal regimen   2. 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 Pure hypercholesterolemia continue statin therapy   4. HTN (hypertension), benign    5. Unstable angina pectoris cardiac catheterization       Plan:    Orders Placed This Encounter   Procedures   . Basic Metabolic Panel   . Prothrombin time/INR   . CBC and differential   . Cardiac Cath Case Request       1. Reviewed Current/Prior ECG, Prior Cardiovasuclar workup reviewed with the patient., Educated regarding Vascular, Structural, and Rhythm Heart Disease States and how that relates to primary and secondary prevention of  heart disease or Counseled importance of weight loss to cardiovascular mortality and effective dieting strategies.    History of Present Illness   Chest Wall Pain  Patient presents for evaluation of chest wall pain. Onset was 1 week ago. Symptoms have been worsening since that time. The patient describes the pain as dull in the anterior chest wall: on the left. Associated symptoms are:  chest pain. Aggravating factors are: exercise. Alleviating factors are: rest. Mechanism of injury: none known. Previous visits for this problem: none. Evaluation to date: none. Treatment to date: none.      Allergies, Problem List, Past medical History, Social History  Family History,  Medications,  Reviewed  @DATE @  No Known Allergies    Past Medical History:   Diagnosis Date   . Chest pain 01/20/2016   . Coronary artery disease involving native coronary artery of native heart without angina pectoris 02/07/2016   . HTN (hypertension), benign 01/20/2016   . NSTEMI (non-ST elevated myocardial infarction) 02/07/2016   . Presence of stent in LAD coronary artery 02/07/2016   . S/P angioplasty with stent 02/07/2016       Past Surgical History:   Procedure Laterality Date   . CORONARY ANGIOPLASTY     . NO PAST SURGERIES         Family History   Problem Relation Age of Onset   . Hypertension Mother    . Diabetes Mother    . Kidney disease Mother    . CABG Father 14   . CABG Brother 55     x 3       Medications:   Current Outpatient Prescriptions   Medication Sig   . amLODIPine (NORVASC) 10 MG tablet Take 0.5 tablets (5 mg total)  by mouth daily. (Patient taking differently: Take 10 mg by mouth daily.    )   . aspirin (GOODSENSE ASPIRIN) 81 MG chewable tablet Chew 81 mg by mouth daily.       Marland Kitchen atorvastatin (LIPITOR) 40 MG tablet Take 40 mg by mouth daily.       Marland Kitchen glipiZIDE (GLUCOTROL) 10 MG tablet Take 10 mg by mouth 2 (two) times daily before meals.       Marland Kitchen lisinopril (PRINIVIL,ZESTRIL) 10 MG tablet Take 1 tablet (10 mg total) by mouth daily.   . metFORMIN (GLUCOPHAGE) 850 MG tablet Take 850 mg by mouth 2 (two) times daily with meals.       . metoprolol (LOPRESSOR) 25 MG tablet Take 25 mg by mouth 2 (two) times daily.       . nitroglycerin (NITROSTAT) 0.4 MG SL tablet Place 0.4 mg under the tongue every 5 (five) minutes as needed.       . ticagrelor (BRILINTA) 90 MG Tab Take 1 tablet (90 mg total) by mouth every 12 (twelve)  hours.   . isosorbide mononitrate (IMDUR) 30 MG 24 hr tablet Take 1 tablet (30 mg total) by mouth daily.       Physical Exam:   General Appearance:  Alert, well-appearing male in no acute distress.    Vital Signs Reviewed  Vital Signs: BP (!) 170/91   Pulse 93   Ht 1.727 m (5\' 8" )   Wt 93 kg (205 lb)   BMI 31.17 kg/m    HEENT: Sclera anicteric, conjunctiva without pallor, moist mucous membranes, normal dentition. No arcus.   Neck:  Supple without jugular venous distention. Thyroid nonpalpable. Normal carotid upstroke without bruits.   Chest: clear to auscultation bilaterally without wheezes, rales, or rhonchi  Cardiovascular: regular rhythm, soft heart sounds, S1/S2 normal and no murmurs, gallops or rubs are appreciated  Abdomen: Soft, nontender, nondistended, with normoactive bowel sounds. No organomegaly.  No pulsatile masses, or bruits.   Extremities:  peripheral pulses full and equal, warm, no cyanosis or clubbing and no edema         ROS:   General: no acute distress No weight loss   Skin: No bruising   Respiratory: per HPI  Cardiovascular: per HPI  GI: No blood in the stools. No nausea or vomiting.  Neuro: no focal deficits  Musculoskeletal no arthralgias or joint swelling    Results for orders placed in visit on 01/20/16   Echocardiogram Adult Complete W Clr/ Dopp Waveform    Narrative [facility] [place]  [address] [place]  [address]                     [1]logo  [place] [address]  [phone]    Location:               [facility]                                       MRN:    [id number] Date of Service: 02/23/2016  Patient Name:           Estill, Llerena  DOB:    December 13, 1955 Height:          68.0in  Interpreting Physician: Miachel Roux, MD                                                   Gender: M        Weight:          207lb  Sonographer:            Bevelyn Ngo, RDCS                                                     Age:    46       BSA:             2.9m2  Indication:             Chest pain, unspecified typeHTN (hypertension), benignHyperglycemia BP:     158/81  Study Quality: Technically fair.    Referring Physician: MAFISHAHRYAR    _______________________________________________________________________________________________  Echocardiogram Report      Conclusions: 1. Normal left and right ventricular size and function.                 2. Estimated ejection fraction 65%.                 3. Mild concentric left ventricular hypertrophy.                 4. Trace aortic regurgitation.                 5. Trace mitral regurgitation.                 6. Trace tricuspid regurgitation.                 7. no significant change compared to the previous echo done in [place] on 01/28/2016                 8. Technically fair study  Findings:  Left Ventricle:  Normal left ventricular size. Normal left ventricular systolic function.65% No regional wall motion abnormalities noted. Mild concentric left ventricular hypertrophy. Normal left ventricular diastolic function.  Right Ventricle: Normal right ventricular size and function.  Left Atrium:     Normal left atrial size.  Right Atrium:    Normal right atrial size.  Mitral Valve:    Trace mitral regurgitation.  Tricuspid Valve: Trace tricuspid regurgitation. Pulmonary arterial systolic pressure is estimated to be with a right atrial pressure of .  Aortic Valve:    The aortic valve is trileaflet. Mild aortic valve calcification. Trace aortic regurgitation. No aortic stenosis.  Pulmonic Valve:  No significant pulmonic regurgitation.  Aorta:           Aortic root and proximal ascending aorta are normal in size.  Pericardium:     No pericardial effusion.  IVC:             The inferior vena cava is normal in size with normal respirophasic variation.    Electronically signed Miachel Roux, MD 02/23/16 5:15  PM    _______________________________________________________________________________________________  STUDY DATA  _______________________________________________________________________________________________    Location:  [facility]                                       MRN:    [id number]  Patient Name:           Jeray, Shugart                                                       DOB:    [birth year] Date of Service: 02/23/2016  Interpreting Physician: Miachel Roux, MD                                                   Gender: M        Height:          68.0in  Sonographer:            Bevelyn Ngo, RDCS                                                    Age:    63       Weight:          207lb  Indication:             Chest pain, unspecified typeHTN (hypertension), benignHyperglycemia BP:     158/81    ------- Left Ventricle ------ ------------ Aorta            ----------- -------- Mitral Valve -------  LVIDd:     4.47(3.7-5.6)cm  Aortic Root:           2.41(2.3-3.7)cm  LVIDs:     2.9(2.0-3.5)cm  IVSDd:     1.2(0.6-1.1)cm   Prox. Ascending Aorta: 2.56(2.3-3.7)cm  LVPW:      1.2(0.6-1.1)cm   ------------ Aortic Valve     -----------  EF:        65(50-75)%       Peak:                  1.54(<=2.5)m/s  FS:        33%               Peak Grad:             9.48(<=16)mmHg  -------- Left Atrium --------                                           PHT:          61.9ms  LA:        3.18(1.9-4.0)cm                                            MVA by PHT:   3.57(4-6)cm2  AVA(vel):              2.47(2-5)cm2  -------- Right Heart --------  RVIDd:     3.14(0.7-4.1)cm  LVOT Diam:             2.11(1.8-2.4)cm                                LVOT PV:               1.09(0.7-1.1)m/s                                LVOT PG:               4.35mmHg          ------ Tricuspid Valve ------  ----- Diastolic Function ----                                            TR Peak Vel:  2.46m/s  Peak E:    0.86(0.6-1.3)m/s ------------ Pulmonic Valve   ----------- TR Peak Grad:  Peak A:    0.94(<=0.7)m/s                                             RAP:          5(<=5)mmHg  E/A Ratio: 0.91(0.75-1.5)                                             RVSP:         25(<=36)mmHg  DT:        212.31(<=200)ms                                           IVC:          1.2(<=2.1)cm    Report for Treyvon Blahut [id number] on 02/23/16    References    Visible links       Results for orders placed in visit on 03/20/16   Exercise Nuclear Stress Test    Narrative [facility] [place]  Exercise Tc-50m Myoview One Day Perfusion Study    NAME:  Terion Hedman DOB:  1956/06/10 STUDY DATE: 03/20/2016   MRN:  [id number]   SEX: male  INTERPRETATION DATE: 03/20/2016   HEIGHT: 172.7 cm (5\' 8" ) WEIGHT: 95.3 kg (210 lb)        REFERRING PHYSICIAN:  Willaim Sheng, MD     INDICATION:    1. Coronary artery disease of native artery of native heart with stable   angina pectoris         EXERCISE ONE DAY PERFUSION STUDY  The patient was injected with 10.6 mCi of Tc-34m Myoview (IV Location   (Rest): Lt/AC) and SPECT imaging was begun approximately 30 minutes post   injection. The camera obtained images with a 180-degree orbit around the   patient's heart. After the rest images, the patient was then  prepped for a   Bruce protocol exercise stress test. One minute prior to the termination   of exercise, the patient was injected intravenously with 37.7 mCi of   Tc-55m Myoview (IV Location (Stress): Lt/AC). Gated SPECT imaging was   started at least 30 minutes post injection. The patient was placed in the   supine position and the images were obtained with a 180-degree orbit   around the patient's heart while gating, when feasible, to the patient's   ECG. The study was then processed and displayed for evaluation.    EXERCISE STRESS  Blood  Pressure: Rest = 160/72 Maximum =    Heart Rate:    Rest = 89 Maximum =   ( % PMHR)    Exercise Time:    METS achieved: 7.  Test stopped due to: Target heart rate attained    Patient Symptoms: chest pains  Blood pressure response: Hypertensive  Arrhythmias: None    Resting ECG: Normal sinus rhythm, no significant ST changes.  Stress ECG: Abnormal ECG response with more than 1 mm ST depression in the   inferior leads suggestive of ischemia    INTERPRETATION  1. Study quality was fair.    2. On the myocardial SPECT images there is a small sized, mild intensity,   partially reversible defect, inferior wall .  3. [facility] quantitative analysis confirms this visual   interpretation.   4. Gated wall motion analysis demonstrates normal left ventricular   function with no wall motion abnormalities.  5.The calculated LVEF at rest is 57%.  6.The calculated LVEF post-stress is 56%.    CONCLUSIONS  1. This is a abnormal stress Myoview study demonstrating small sized, mild   intensity, reversible defect, involving the inferior wall suggestive of   ischemia.  2. Left ventricular ejection fraction is calculated to be 57% with normal   left ventricular function with no wall motion abnormalities at rest and   stress.  Comparison of resting and post-stress gated LV images reveals no   significant change, which is normal.  3. There is no previous study for comparison.    INTERPRETED BY: Miachel Roux, MD, FACC    Tc-21m Myoview Activity: 10.6 mCi at 1206 (IV Location (Rest): Lt/AC)   Tc-44m Myoview Activity: 37.7 mCi at 1335 (IV Location (Stress): Lt/AC)       Electronically signed by: Willaim Sheng, MD 03/24/2016 5:27 PM

## 2016-03-31 DIAGNOSIS — I2510 Atherosclerotic heart disease of native coronary artery without angina pectoris: Secondary | ICD-10-CM

## 2016-04-06 ENCOUNTER — Encounter (INDEPENDENT_AMBULATORY_CARE_PROVIDER_SITE_OTHER): Payer: Self-pay

## 2016-04-06 ENCOUNTER — Encounter (INDEPENDENT_AMBULATORY_CARE_PROVIDER_SITE_OTHER): Payer: Self-pay | Admitting: Cardiovascular Disease

## 2016-04-06 ENCOUNTER — Ambulatory Visit (INDEPENDENT_AMBULATORY_CARE_PROVIDER_SITE_OTHER): Payer: No Typology Code available for payment source | Admitting: Cardiovascular Disease

## 2016-04-06 ENCOUNTER — Telehealth (INDEPENDENT_AMBULATORY_CARE_PROVIDER_SITE_OTHER): Payer: Self-pay | Admitting: Cardiovascular Disease

## 2016-04-06 VITALS — BP 165/83 | HR 91 | Ht 68.0 in | Wt 199.0 lb

## 2016-04-06 DIAGNOSIS — Z955 Presence of coronary angioplasty implant and graft: Secondary | ICD-10-CM

## 2016-04-06 DIAGNOSIS — I10 Essential (primary) hypertension: Secondary | ICD-10-CM

## 2016-04-06 DIAGNOSIS — I2510 Atherosclerotic heart disease of native coronary artery without angina pectoris: Secondary | ICD-10-CM

## 2016-04-06 DIAGNOSIS — E7800 Pure hypercholesterolemia, unspecified: Secondary | ICD-10-CM

## 2016-04-06 MED ORDER — VALSARTAN 160 MG PO TABS
160.0000 mg | ORAL_TABLET | Freq: Every day | ORAL | 3 refills | Status: DC
Start: 2016-04-06 — End: 2016-05-09

## 2016-04-06 MED ORDER — METOPROLOL TARTRATE 50 MG PO TABS
50.0000 mg | ORAL_TABLET | Freq: Two times a day (BID) | ORAL | 3 refills | Status: DC
Start: 2016-04-06 — End: 2016-05-09

## 2016-04-06 NOTE — Progress Notes (Signed)
Maceo Pro MD [facility]  [place] [facility]  [place] [phone]  [place] [facility] - [facility]  [address] [place]  [address]  [place] [address]  Dept: [phone]  Dept Fax: [phone]  Loc Appt: [phone]  Loc: [phone]  Loc Fax: [phone]      Patient Name:  Despina Pole  Date: 04/06/2016  Patient Number:  [id number]  DOB:  Nov 05, 1955  Gender: male  Chief Complaint   Patient presents with   . Coronary Artery Disease     s/p cardiac cath with pci/stnet of the OM2       Dear Pedro Earls, Lily Lovings, MD  I had the pleasure of seeing Cloys Vera  followup please see my enclosed note for this patient's management.    Thank you for the opportunity to participate in the care of your patients.  If you have any questions please do not hesitate to contact me.      Assessment:  1. Coronary artery disease involving native coronary artery of native heart without angina pectoris - Increased beta blocker therapy to Metoprolol Tartrate 50 mg BID.    2. Presence of drug coated stent in left circumflex coronary artery - continue DAPT and ordered cardiac rehab.    3. Pure hypercholesterolemia - recent LDL 77, continue current statin therapy.    4. HTN (hypertension) - discontinued Lisinopril and ordered Valsartan 160 mg QD for better blood pressure control. Continue CCB therapy as well.        Plan:    Orders Placed This Encounter   Procedures   . Referral to Cardiac Rehabilitation   . ECG 12 lead (Today)       1. Reviewed Current/Prior ECG, Prior Cardiovasuclar workup reviewed with the patient., Educated regarding Vascular, Structural, and Rhythm Heart Disease States and how that relates to primary and secondary prevention of  heart disease or Counseled importance of weight loss to cardiovascular mortality and effective dieting strategies.    History of Present Illness   Atherosclerotic Disease  Patient is a 60 y.o. male who presents for follow-up of atherosclerotic coronary artery disease. Recent  history: taking medications as instructed, no medication side effects noted, no TIA's, no chest pain on exertion, no dyspnea on exertion and no swelling of ankles. Patient's symptoms have been stable. Medication side effects include: none.    The following portions of the patient's history were reviewed and updated as appropriate: allergies, current medications, past family history, past medical history, past social history, past surgical history and problem list.      Allergies, Problem List, Past medical History, Social History  Family History,  Medications,  Reviewed  @DATE @  No Known Allergies    Past Medical History:   Diagnosis Date   . Chest pain 01/20/2016   . Coronary artery disease involving native coronary artery of native heart without angina pectoris 02/07/2016   . HTN (hypertension), benign 01/20/2016   . NSTEMI (non-ST elevated myocardial infarction) 02/07/2016   . Presence of stent in LAD coronary artery 02/07/2016   . S/P angioplasty with stent 02/07/2016       Past Surgical History:   Procedure Laterality Date   . CORONARY ANGIOPLASTY     . NO PAST SURGERIES         Family History   Problem Relation Age of Onset   . Hypertension Mother    . Diabetes Mother    . Kidney disease Mother    . CABG Father 79   .  CABG Brother 55     x 3       Medications:   Current Outpatient Prescriptions   Medication Sig   . amLODIPine (NORVASC) 10 MG tablet Take 0.5 tablets (5 mg total) by mouth daily. (Patient taking differently: Take 10 mg by mouth daily.    )   . aspirin (GOODSENSE ASPIRIN) 81 MG chewable tablet Chew 81 mg by mouth daily.       Marland Kitchen atorvastatin (LIPITOR) 40 MG tablet Take 40 mg by mouth daily.       Marland Kitchen glipiZIDE (GLUCOTROL) 10 MG tablet Take 10 mg by mouth 2 (two) times daily before meals.       . isosorbide mononitrate (IMDUR) 30 MG 24 hr tablet Take 1 tablet (30 mg total) by mouth daily.   . metFORMIN (GLUCOPHAGE) 850 MG tablet Take 850 mg by mouth 2 (two) times daily with meals.       . metoprolol  (LOPRESSOR) 50 MG tablet Take 1 tablet (50 mg total) by mouth 2 (two) times daily.   . nitroglycerin (NITROSTAT) 0.4 MG SL tablet Place 0.4 mg under the tongue every 5 (five) minutes as needed.       . ticagrelor (BRILINTA) 90 MG Tab Take 1 tablet (90 mg total) by mouth every 12 (twelve) hours.   . valsartan (DIOVAN) 160 MG tablet Take 1 tablet (160 mg total) by mouth daily.       Physical Exam:   General Appearance:  Alert, well-appearing male in no acute distress.    Vital Signs Reviewed  Vital Signs: BP 165/83   Pulse 91   Ht 1.727 m (5\' 8" )   Wt 90.3 kg (199 lb)   BMI 30.26 kg/m    HEENT: Sclera anicteric, conjunctiva without pallor, moist mucous membranes, normal dentition. No arcus.   Neck:  Supple without jugular venous distention. Thyroid nonpalpable. Normal carotid upstroke without bruits.   Chest: clear to auscultation bilaterally without wheezes, rales, or rhonchi  Cardiovascular: regular rhythm, soft heart sounds, S1/S2 normal and no murmurs, gallops or rubs are appreciated  Abdomen: Soft, nontender, nondistended, with normoactive bowel sounds. No organomegaly.  No pulsatile masses, or bruits.   Extremities:  peripheral pulses full and equal, warm, no cyanosis or clubbing and no edema    ROS:   General: no acute distress No weight loss   Skin: No bruising   Respiratory: per HPI  Cardiovascular: per HPI  GI: No blood in the stools. No nausea or vomiting.  Neuro: no focal deficits  Musculoskeletal no arthralgias or joint swelling    Results for orders placed in visit on 01/20/16   Echocardiogram Adult Complete W Clr/ Dopp Waveform    Narrative [facility] [place]  [address] [place]  [address]                     [1]logo  [place] [address]  [phone]    Location:               [facility] - [facility]                                       MRN:    [id number] Date of Service: 02/23/2016  Patient Name:           Simuel, Stebner  DOB:    Apr 19, 1956 Height:          68.0in  Interpreting Physician: Miachel Roux, MD                                                   Gender: M        Weight:          207lb  Sonographer:            Bevelyn Ngo, RDCS                                                    Age:    41       BSA:             2.50m2  Indication:             Chest pain, unspecified typeHTN (hypertension), benignHyperglycemia BP:     158/81  Study Quality: Technically fair.    Referring Physician: MAFISHAHRYAR    _______________________________________________________________________________________________  Echocardiogram Report      Conclusions: 1. Normal left and right ventricular size and function.                 2. Estimated ejection fraction 65%.                 3. Mild concentric left ventricular hypertrophy.                 4. Trace aortic regurgitation.                 5. Trace mitral regurgitation.                 6. Trace tricuspid regurgitation.                 7. no significant change compared to the previous echo done in [place] on 01/28/2016                 8. Technically fair study  Findings:  Left Ventricle:  Normal left ventricular size. Normal left ventricular systolic function.65% No regional wall motion abnormalities noted. Mild concentric left ventricular hypertrophy. Normal left ventricular diastolic function.  Right Ventricle: Normal right ventricular size and function.  Left Atrium:     Normal left atrial size.  Right Atrium:    Normal right atrial size.  Mitral Valve:    Trace mitral regurgitation.  Tricuspid Valve: Trace tricuspid regurgitation. Pulmonary arterial systolic pressure is estimated to be with a right atrial pressure of .  Aortic Valve:    The aortic valve is trileaflet. Mild aortic valve calcification. Trace aortic regurgitation. No aortic stenosis.  Pulmonic Valve:  No significant pulmonic regurgitation.  Aorta:           Aortic root and proximal ascending aorta are normal in  size.  Pericardium:     No pericardial effusion.  IVC:             The inferior vena cava is normal in size with normal respirophasic variation.    Electronically signed Miachel Roux, MD 02/23/16 5:15 PM    _______________________________________________________________________________________________  STUDY DATA  _______________________________________________________________________________________________    Location:  [facility]                                       MRN:    [id number]  Patient Name:           Keivon, Garden                                                       DOB:    04/07/1956 Date of Service: 02/23/2016  Interpreting Physician: Miachel Roux, MD                                                   Gender: M        Height:          68.0in  Sonographer:            Bevelyn Ngo, RDCS                                                    Age:    25       Weight:          207lb  Indication:             Chest pain, unspecified typeHTN (hypertension), benignHyperglycemia BP:     158/81    ------- Left Ventricle ------ ------------ Aorta            ----------- -------- Mitral Valve -------  LVIDd:     4.47(3.7-5.6)cm  Aortic Root:           2.41(2.3-3.7)cm  LVIDs:     2.9(2.0-3.5)cm  IVSDd:     1.2(0.6-1.1)cm   Prox. Ascending Aorta: 2.56(2.3-3.7)cm  LVPW:      1.2(0.6-1.1)cm   ------------ Aortic Valve     -----------  EF:        65(50-75)%       Peak:                  1.54(<=2.5)m/s  FS:        33%               Peak Grad:             9.48(<=16)mmHg  -------- Left Atrium --------                                           PHT:          61.15ms  LA:        3.18(1.9-4.0)cm                                            MVA by PHT:   3.57(4-6)cm2  AVA(vel):              2.47(2-5)cm2  -------- Right Heart --------  RVIDd:     3.14(0.7-4.1)cm  LVOT Diam:             2.11(1.8-2.4)cm                                 LVOT PV:               1.09(0.7-1.1)m/s                                LVOT PG:               4.25mmHg          ------ Tricuspid Valve ------  ----- Diastolic Function ----                                           TR Peak Vel:  2.34m/s  Peak E:    0.86(0.6-1.3)m/s ------------ Pulmonic Valve   ----------- TR Peak Grad:  Peak A:    0.94(<=0.7)m/s                                             RAP:          5(<=5)mmHg  E/A Ratio: 0.91(0.75-1.5)                                             RVSP:         25(<=36)mmHg  DT:        212.31(<=200)ms                                           IVC:          1.2(<=2.1)cm    Report for Zyshonne Malecha [id number] on 02/23/16    References    Visible links       Results for orders placed in visit on 03/20/16   Exercise Nuclear Stress Test    Narrative [place] [facility] [place]  Exercise Tc-82m Myoview One Day Perfusion Study    NAME:  Chevez Sambrano DOB:  23-Feb-1956 STUDY DATE: 03/20/2016   MRN:  [id number]   SEX: male  INTERPRETATION DATE: 03/20/2016   HEIGHT: 172.7 cm (5\' 8" ) WEIGHT: 95.3 kg (210 lb)        REFERRING PHYSICIAN:  Willaim Sheng, MD     INDICATION:    1. Coronary artery disease of native artery of native heart with stable   angina pectoris         EXERCISE ONE DAY PERFUSION STUDY  The patient was injected with 10.6 mCi of Tc-71m Myoview (IV Location   (Rest): Lt/AC) and SPECT imaging was begun approximately 30 minutes post   injection. The camera obtained images with a 180-degree orbit around the   patient's heart. After the rest images, the patient was then  prepped for a   Bruce protocol exercise stress test. One minute prior to the termination   of exercise, the patient was injected intravenously with 37.7 mCi of   Tc-49m Myoview (IV Location (Stress): Lt/AC). Gated SPECT imaging was   started at least 30 minutes post injection. The patient was placed in the   supine position and the images were  obtained with a 180-degree orbit   around the patient's heart while gating, when feasible, to the patient's   ECG. The study was then processed and displayed for evaluation.    EXERCISE STRESS  Blood Pressure: Rest = 160/72 Maximum =    Heart Rate:    Rest = 89 Maximum =   ( % PMHR)    Exercise Time:    METS achieved: 7.  Test stopped due to: Target heart rate attained    Patient Symptoms: chest pains  Blood pressure response: Hypertensive  Arrhythmias: None    Resting ECG: Normal sinus rhythm, no significant ST changes.  Stress ECG: Abnormal ECG response with more than 1 mm ST depression in the   inferior leads suggestive of ischemia    INTERPRETATION  1. Study quality was fair.    2. On the myocardial SPECT images there is a small sized, mild intensity,   partially reversible defect, inferior wall .  3. [facility] quantitative analysis confirms this visual   interpretation.   4. Gated wall motion analysis demonstrates normal left ventricular   function with no wall motion abnormalities.  5.The calculated LVEF at rest is 57%.  6.The calculated LVEF post-stress is 56%.    CONCLUSIONS  1. This is a abnormal stress Myoview study demonstrating small sized, mild   intensity, reversible defect, involving the inferior wall suggestive of   ischemia.  2. Left ventricular ejection fraction is calculated to be 57% with normal   left ventricular function with no wall motion abnormalities at rest and   stress.  Comparison of resting and post-stress gated LV images reveals no   significant change, which is normal.  3. There is no previous study for comparison.    INTERPRETED BY: Miachel Roux, MD, FACC    Tc-33m Myoview Activity: 10.6 mCi at 1206 (IV Location (Rest): Lt/AC)   Tc-8m Myoview Activity: 37.7 mCi at 1335 (IV Location (Stress): Lt/AC)       Electronically signed by: Willaim Sheng, MD 04/06/2016 10:13 AM

## 2016-04-06 NOTE — Telephone Encounter (Signed)
Per Dr. Billie Ruddy, patient needs to be enrolled in cardiac rehab at [facility].

## 2016-04-06 NOTE — Telephone Encounter (Signed)
I refaxed a new order with Dr Aris Everts signature, I received confirmation       Dustin Leon

## 2016-05-05 ENCOUNTER — Encounter (INDEPENDENT_AMBULATORY_CARE_PROVIDER_SITE_OTHER): Payer: Self-pay

## 2016-05-09 ENCOUNTER — Encounter (INDEPENDENT_AMBULATORY_CARE_PROVIDER_SITE_OTHER): Payer: Self-pay

## 2016-05-09 ENCOUNTER — Encounter (INDEPENDENT_AMBULATORY_CARE_PROVIDER_SITE_OTHER): Payer: Self-pay | Admitting: Cardiovascular Disease

## 2016-05-09 ENCOUNTER — Ambulatory Visit (INDEPENDENT_AMBULATORY_CARE_PROVIDER_SITE_OTHER): Payer: No Typology Code available for payment source | Admitting: Cardiovascular Disease

## 2016-05-09 VITALS — BP 145/76 | HR 76 | Ht 68.0 in | Wt 203.0 lb

## 2016-05-09 DIAGNOSIS — R079 Chest pain, unspecified: Secondary | ICD-10-CM

## 2016-05-09 DIAGNOSIS — R002 Palpitations: Secondary | ICD-10-CM

## 2016-05-09 DIAGNOSIS — I2510 Atherosclerotic heart disease of native coronary artery without angina pectoris: Secondary | ICD-10-CM

## 2016-05-09 DIAGNOSIS — I214 Non-ST elevation (NSTEMI) myocardial infarction: Secondary | ICD-10-CM

## 2016-05-09 DIAGNOSIS — Z955 Presence of coronary angioplasty implant and graft: Secondary | ICD-10-CM

## 2016-05-09 DIAGNOSIS — I10 Essential (primary) hypertension: Secondary | ICD-10-CM

## 2016-05-09 DIAGNOSIS — E7800 Pure hypercholesterolemia, unspecified: Secondary | ICD-10-CM

## 2016-05-09 MED ORDER — RANOLAZINE ER 500 MG PO TB12
500.0000 mg | ORAL_TABLET | Freq: Two times a day (BID) | ORAL | 3 refills | Status: AC
Start: 2016-05-09 — End: ?

## 2016-05-09 MED ORDER — VALSARTAN 320 MG PO TABS
320.0000 mg | ORAL_TABLET | Freq: Every day | ORAL | 3 refills | Status: AC
Start: 2016-05-09 — End: 2016-08-07

## 2016-05-09 MED ORDER — METOPROLOL TARTRATE 100 MG PO TABS
100.0000 mg | ORAL_TABLET | Freq: Two times a day (BID) | ORAL | 3 refills | Status: AC
Start: 2016-05-09 — End: 2016-08-07

## 2016-05-09 NOTE — Progress Notes (Signed)
Maceo Pro MD [facility]  [place] [facility]  [place] [phone]  [facility] - [facility]  [address] [place]  [address]  [place] [address]  Dept: [phone]  Dept Fax: [phone]  Loc Appt: [phone]  Loc: [phone]  Loc Fax: [phone]      Patient Name:  Dustin Leon  Date: 05/09/2016  Patient Number:  [id number]  DOB:  [birth year]  Gender: male  Chief Complaint   Patient presents with   . Coronary Artery Disease     1 mo f/u post cardiac cath, increased metoprolol to 50 mg BID   . Hypertension     Palermo'd lisinopril last visit and started on valsartan 160 mg QD       Dear Pedro Earls, Lily Lovings, MD  I had the pleasure of seeing Dustin Leon  followup please see my enclosed note for this patient's management.    Thank you for the opportunity to participate in the care of your patients.  If you have any questions please do not hesitate to contact me.      Assessment:  1. Coronary artery disease involving native coronary artery of native heart without angina pectoris  Continue Brilinta and Aspirin    2. Presence of drug coated stent in left circumflex coronary artery    3. HTN (hypertension), benign blood pressure is uncontrolled increased   Hypertension ROS: taking medications as instructed, no medication side effects noted, no TIA's, no swelling of ankles and palpitations described as thumping.   Assessment:    Hypertension borderline controlled.     Plan:   Orders and follow up as documented in patient record.  Reviewed diet, exercise and weight control.  Copy of written low fat low cholesterol diet provided and reviewed.  Cardiovascular risk and specific lipid/LDL goals reviewed.  Reviewed medications and side effects in detail.  The following changes are to be made: increase valsartan to  320 mg and Metoprolol to 100 mg bid .     4. Pure hypercholesterolemia    5. Chest pain, unspecified type started Ranexa for his anginal sxs   6.  7. NSTEMI (non-ST elevated myocardial  infarction)   Palpitations patient feels occasional pounding in the chest I will order a 24 holter NSVTS/SVTS        Plan:    Orders Placed This Encounter   Procedures   . Holter 24 hr   . ECG 12 lead (Today)       1. Reviewed Current/Prior ECG, Prior Cardiovasuclar workup reviewed with the patient., Educated regarding Vascular, Structural, and Rhythm Heart Disease States and how that relates to primary and secondary prevention of  heart disease or Counseled importance of weight loss to cardiovascular mortality and effective dieting strategies.    History of Present Illness   Coronary Artery Disease  Patient presents for routine coronary artery disease follow-up. Current symptoms: chest pressure/discomfort. Aggravating factors: none. Alleviating factors: none. Cardiac risk factors include advanced age (older than 60 for men, 36 for women), dyslipidemia, hypertension, male gender and obesity (BMI >= 30 kg/m2).        Allergies, Problem List, Past medical History, Social History  Family History,  Medications,  Reviewed  @DATE @  No Known Allergies    Past Medical History:   Diagnosis Date   . Chest pain 01/20/2016   . Coronary artery disease involving native coronary artery of native heart without angina pectoris 02/07/2016   . HTN (hypertension), benign 01/20/2016   . NSTEMI (non-ST elevated  myocardial infarction) 02/07/2016   . Presence of stent in LAD coronary artery 02/07/2016   . S/P angioplasty with stent 02/07/2016       Past Surgical History:   Procedure Laterality Date   . CORONARY ANGIOPLASTY     . NO PAST SURGERIES         Family History   Problem Relation Age of Onset   . Hypertension Mother    . Diabetes Mother    . Kidney disease Mother    . CABG Father 45   . CABG Brother 55     x 3       Medications:   Current Outpatient Prescriptions   Medication Sig   . amLODIPine (NORVASC) 5 MG tablet Take 5 mg by Mouth Once a Day.   Marland Kitchen aspirin (GOODSENSE ASPIRIN) 81 MG chewable tablet Chew 81 mg by mouth daily.       Marland Kitchen  atorvastatin (LIPITOR) 40 MG tablet Take 40 mg by mouth daily.       Marland Kitchen glipiZIDE (GLUCOTROL) 10 MG tablet Take 10 mg by mouth 2 (two) times daily before meals.       . isosorbide mononitrate (IMDUR) 30 MG 24 hr tablet Take 1 tablet (30 mg total) by mouth daily.   . metFORMIN (GLUCOPHAGE) 850 MG tablet Take 850 mg by mouth 2 (two) times daily with meals.       . metoprolol tartrate (LOPRESSOR) 100 MG tablet Take 1 tablet (100 mg total) by mouth 2 (two) times daily.   . nitroglycerin (NITROSTAT) 0.4 MG SL tablet Place 0.4 mg under the tongue every 5 (five) minutes as needed.       . ranolazine (RANEXA) 500 MG 12 hr tablet Take 1 tablet (500 mg total) by mouth 2 (two) times daily.   . ticagrelor (BRILINTA) 90 MG Tab Take 1 tablet (90 mg total) by mouth every 12 (twelve) hours.   . valsartan (DIOVAN) 320 MG tablet Take 1 tablet (320 mg total) by mouth daily.       Physical Exam:   General Appearance:  Alert, well-appearing male in no acute distress.    Vital Signs Reviewed  Vital Signs: BP 145/76   Pulse 76   Ht 1.727 m (5\' 8" )   Wt 92.1 kg (203 lb)   BMI 30.87 kg/m    HEENT: Sclera anicteric, conjunctiva without pallor, moist mucous membranes, normal dentition. No arcus.   Neck:  Supple without jugular venous distention. Thyroid nonpalpable. Normal carotid upstroke without bruits.   Chest: clear to auscultation bilaterally without wheezes, rales, or rhonchi  Cardiovascular: regular rhythm, soft heart sounds, S1/S2 normal and no murmurs, gallops or rubs are appreciated  Abdomen: Soft, nontender, nondistended, with normoactive bowel sounds. No organomegaly.  No pulsatile masses, or bruits.   Extremities:  peripheral pulses full and equal, warm, no cyanosis or clubbing and no edema    ROS:   General: no acute distress No weight loss   Skin: No bruising   Respiratory: per HPI  Cardiovascular: per HPI  GI: No blood in the stools. No nausea or vomiting.  Neuro: no focal deficits  Musculoskeletal no arthralgias or joint  swelling    Results for orders placed in visit on 01/20/16   Echocardiogram Adult Complete W Clr/ Dopp Waveform    Narrative [facility] [place]  [address]  [phone]                     [  1]logo  [place] [address]  [phone]    Location:                Cardiology - [facility]                                       MRN:    [id number] Date of Service: 02/23/2016  Patient Name:           Dustin Leon, Dustin Leon                                                       DOB:    1955/09/13 Height:          68.0in  Interpreting Physician: Miachel Roux, MD                                                   Gender: Judie Petit        Weight:          207lb  Sonographer:            Bevelyn Ngo, RDCS                                                    Age:    32       BSA:             2.13m2  Indication:             Chest pain, unspecified typeHTN (hypertension), benignHyperglycemia BP:     158/81  Study Quality: Technically fair.    Referring Physician: MAFISHAHRYAR    _______________________________________________________________________________________________  Echocardiogram Report      Conclusions: 1. Normal left and right ventricular size and function.                 2. Estimated ejection fraction 65%.                 3. Mild concentric left ventricular hypertrophy.                 4. Trace aortic regurgitation.                 5. Trace mitral regurgitation.                 6. Trace tricuspid regurgitation.                 7. no significant change compared to the previous echo done in [place] on 01/28/2016                 8. Technically fair study  Findings:  Left Ventricle:  Normal left ventricular size. Normal left ventricular systolic function.65% No regional wall motion abnormalities noted. Mild concentric left ventricular hypertrophy. Normal left ventricular diastolic function.  Right Ventricle: Normal right ventricular size and function.  Left Atrium:     Normal left atrial size.  Right Atrium:     Normal  right atrial size.  Mitral Valve:    Trace mitral regurgitation.  Tricuspid Valve: Trace tricuspid regurgitation. Pulmonary arterial systolic pressure is estimated to be with a right atrial pressure of .  Aortic Valve:    The aortic valve is trileaflet. Mild aortic valve calcification. Trace aortic regurgitation. No aortic stenosis.  Pulmonic Valve:  No significant pulmonic regurgitation.  Aorta:           Aortic root and proximal ascending aorta are normal in size.  Pericardium:     No pericardial effusion.  IVC:             The inferior vena cava is normal in size with normal respirophasic variation.    Electronically signed Miachel Roux, MD 02/23/16 5:15 PM    _______________________________________________________________________________________________  STUDY DATA  _______________________________________________________________________________________________    Location:               [facility] [place]                                       MRN:    [id number]  Patient Name:           Dustin Leon                                                       DOB:    Mar 18, 1956 Date of Service: 02/23/2016  Interpreting Physician: Miachel Roux, MD                                                   Gender: M        Height:          68.0in  Sonographer:            Bevelyn Ngo, RDCS                                                    Age:    23       Weight:          207lb  Indication:             Chest pain, unspecified typeHTN (hypertension), benignHyperglycemia BP:     158/81    ------- Left Ventricle ------ ------------ Aorta            ----------- -------- Mitral Valve -------  LVIDd:     4.47(3.7-5.6)cm  Aortic Root:           2.41(2.3-3.7)cm  LVIDs:     2.9(2.0-3.5)cm  IVSDd:     1.2(0.6-1.1)cm   Prox. Ascending Aorta: 2.56(2.3-3.7)cm  LVPW:      1.2(0.6-1.1)cm   ------------ Aortic Valve     -----------  EF:        65(50-75)%       Peak:                   1.54(<=2.5)m/s  FS:  33%               Peak Grad:             9.48(<=16)mmHg  -------- Left Atrium --------                                           PHT:          61.67ms  LA:        3.18(1.9-4.0)cm                                            MVA by PHT:   3.57(4-6)cm2                                AVA(vel):              2.47(2-5)cm2  -------- Right Heart --------  RVIDd:     3.14(0.7-4.1)cm  LVOT Diam:             2.11(1.8-2.4)cm                                LVOT PV:               1.09(0.7-1.1)m/s                                LVOT PG:               4.71mmHg          ------ Tricuspid Valve ------  ----- Diastolic Function ----                                           TR Peak Vel:  2.63m/s  Peak E:    0.86(0.6-1.3)m/s ------------ Pulmonic Valve   ----------- TR Peak Grad:  Peak A:    0.94(<=0.7)m/s                                             RAP:          5(<=5)mmHg  E/A Ratio: 0.91(0.75-1.5)                                             RVSP:         25(<=36)mmHg  DT:        212.31(<=200)ms                                           IVC:          1.2(<=2.1)cm    Report for Dustin Leon [id number] on 02/23/16    References    Visible  links       Results for orders placed in visit on 03/20/16   Exercise Nuclear Stress Test    Narrative [facility] [place]  Exercise Tc-52m Myoview One Day Perfusion Study    NAME:  Dustin Leon DOB:  [birth year] STUDY DATE: 03/20/2016   MRN:  [id number]   SEX: male  INTERPRETATION DATE: 03/20/2016   HEIGHT: 172.7 cm (5\' 8" ) WEIGHT: 95.3 kg (210 lb)        REFERRING PHYSICIAN:  Willaim Sheng, MD     INDICATION:    1. Coronary artery disease of native artery of native heart with stable   angina pectoris         EXERCISE ONE DAY PERFUSION STUDY  The patient was injected with 10.6 mCi of Tc-22m Myoview (IV Location   (Rest): Lt/AC) and SPECT imaging was begun approximately 30 minutes post    injection. The camera obtained images with a 180-degree orbit around the   patient's heart. After the rest images, the patient was then prepped for a   Bruce protocol exercise stress test. One minute prior to the termination   of exercise, the patient was injected intravenously with 37.7 mCi of   Tc-19m Myoview (IV Location (Stress): Lt/AC). Gated SPECT imaging was   started at least 30 minutes post injection. The patient was placed in the   supine position and the images were obtained with a 180-degree orbit   around the patient's heart while gating, when feasible, to the patient's   ECG. The study was then processed and displayed for evaluation.    EXERCISE STRESS  Blood Pressure: Rest = 160/72 Maximum =    Heart Rate:    Rest = 89 Maximum =   ( % PMHR)    Exercise Time:    METS achieved: 7.  Test stopped due to: Target heart rate attained    Patient Symptoms: chest pains  Blood pressure response: Hypertensive  Arrhythmias: None    Resting ECG: Normal sinus rhythm, no significant ST changes.  Stress ECG: Abnormal ECG response with more than 1 mm ST depression in the   inferior leads suggestive of ischemia    INTERPRETATION  1. Study quality was fair.    2. On the myocardial SPECT images there is a small sized, mild intensity,   partially reversible defect, inferior wall .  3. [facility] quantitative analysis confirms this visual   interpretation.   4. Gated wall motion analysis demonstrates normal left ventricular   function with no wall motion abnormalities.  5.The calculated LVEF at rest is 57%.  6.The calculated LVEF post-stress is 56%.    CONCLUSIONS  1. This is a abnormal stress Myoview study demonstrating small sized, mild   intensity, reversible defect, involving the inferior wall suggestive of   ischemia.  2. Left ventricular ejection fraction is calculated to be 57% with normal   left ventricular function with no wall motion abnormalities at rest and   stress.  Comparison of resting and  post-stress gated LV images reveals no   significant change, which is normal.  3. There is no previous study for comparison.    INTERPRETED BY: Miachel Roux, MD, FACC    Tc-34m Myoview Activity: 10.6 mCi at 1206 (IV Location (Rest): Lt/AC)   Tc-18m Myoview Activity: 37.7 mCi at 1335 (IV Location (Stress): Lt/AC)       Electronically signed by: Willaim Sheng, MD 05/09/2016 4:51 PM

## 2016-06-09 ENCOUNTER — Ambulatory Visit (INDEPENDENT_AMBULATORY_CARE_PROVIDER_SITE_OTHER): Payer: No Typology Code available for payment source | Admitting: Cardiovascular Disease

## 2016-06-14 ENCOUNTER — Ambulatory Visit (INDEPENDENT_AMBULATORY_CARE_PROVIDER_SITE_OTHER): Payer: No Typology Code available for payment source | Admitting: Cardiovascular Disease

## 2016-06-14 DIAGNOSIS — R079 Chest pain, unspecified: Secondary | ICD-10-CM

## 2016-06-14 DIAGNOSIS — R002 Palpitations: Secondary | ICD-10-CM

## 2016-06-21 NOTE — Procedures (Signed)
[facility]  24 hour Holter         Dustin Leon       [id number]    male       [birth year]      Ordering Physician: Maceo Pro, MD, [facility]    Primary Cardiologist: Maceo Pro, MD, [facility]    Indication:   1. Palpitations        Data     Test date: 06/14/2016  Recording time: 24 hours 0 mins  Quality: good         Heart Rate data     Total beats: 103591   Min HR: 55 at 16:05 D1   Avg HR: 69 BPM   Max HR: 89 at 08:00 D2     Patient Triggers: 3    Diary Entries     : 7  AF Summary    AF Burden: 0  AF Events: 0   Longest: 0  Fastest: 0   Slowest: 0 BPM Bradycardia    Bradycardia(<50 BPM): 0  Pauses(>2000 ms: 0   Longest pause : 0 s    Ventricular Ectopy     Total VE beats: 0 (<0.1%)   Isolated: 0 events (<0.1%)   couplet: 0 beats (<0.1%)   Triplet: 0 bpm (<0.1%)   IVR: 0 events (<0.1%)   Bigeminy: 0 0 m 0 s (longest)   Trigeminy: 0 m 0 s (longest)   VT :0     Supraventricular Ectopy      Total SVE beats: 2 (<0.1%)    Isolated: 2 (<0.1%)    Pair 0 (<0.1%)    Run(>=3): 0 (<0.1%)      Symptoms reported: chest pain, pressure      Intepretation     Baseline rhythm: Sinus rhythm    Symptom correlation with arrhythmia: good        Impression     Ravi, Tuccillo was in normal sinus.    The average heart rate, excluding ectopy, was 69 BPM with a minimum of 55 BPM at 16:05 D1 and a maximum of 89 BPM at 08:00 D2.  Heart beats, including entropy, totaled 103591 beats.  There were no VENTRICULAR ectopics found.    SUPRAVENTRICULAR ECTOPICS totaled 2, with 2 single and 0 paired beats.    Patient diary was submitted.    Maceo Pro, MD, [facility]

## 2016-06-22 ENCOUNTER — Telehealth (INDEPENDENT_AMBULATORY_CARE_PROVIDER_SITE_OTHER): Payer: Self-pay

## 2016-06-22 NOTE — Telephone Encounter (Signed)
INC Coordinator reached out to the patient to see if we could be of any assistance in managing their health care needs. The patient did not answer and a voicemail message was left to call back.     Hydee Fleece, LPN   [employer]

## 2016-06-27 ENCOUNTER — Telehealth (INDEPENDENT_AMBULATORY_CARE_PROVIDER_SITE_OTHER): Payer: Self-pay

## 2016-06-27 NOTE — Telephone Encounter (Signed)
INC Coordinator reached out to the patient to see if we could be of any assistance in managing their health care needs. The patient did not answer and a voicemail message was left to call back.     Nikalas Bramel, LPN   [facility]

## 2016-06-29 ENCOUNTER — Telehealth (INDEPENDENT_AMBULATORY_CARE_PROVIDER_SITE_OTHER): Payer: Self-pay

## 2016-06-29 NOTE — Telephone Encounter (Signed)
INC Coordinator reached out to the patient to see if we could be of any assistance in managing their health care needs. The patient did not answer and a voicemail message was left to call back.     Ivah Girardot, LPN   [facility]

## 2017-07-08 ENCOUNTER — Emergency Department (HOSPITAL_COMMUNITY): Payer: No Typology Code available for payment source

## 2017-07-08 ENCOUNTER — Encounter (HOSPITAL_COMMUNITY): Payer: Self-pay | Admitting: Emergency Medicine

## 2017-07-08 ENCOUNTER — Other Ambulatory Visit: Payer: Self-pay

## 2017-07-08 ENCOUNTER — Emergency Department (HOSPITAL_COMMUNITY)
Admission: EM | Admit: 2017-07-08 | Discharge: 2017-07-08 | Disposition: A | Discharge status: Home / Self Care | Payer: No Typology Code available for payment source | Attending: Emergency Medicine | Admitting: Emergency Medicine

## 2017-07-08 DIAGNOSIS — I10 Essential (primary) hypertension: Secondary | ICD-10-CM | POA: Insufficient documentation

## 2017-07-08 DIAGNOSIS — F17210 Nicotine dependence, cigarettes, uncomplicated: Secondary | ICD-10-CM | POA: Insufficient documentation

## 2017-07-08 DIAGNOSIS — M25572 Pain in left ankle and joints of left foot: Secondary | ICD-10-CM | POA: Insufficient documentation

## 2017-07-08 HISTORY — DX: Essential (primary) hypertension: I10

## 2017-07-08 HISTORY — DX: Acute myocardial infarction, unspecified: I21.9

## 2017-07-08 MED ORDER — DICLOFENAC SODIUM 50 MG PO TBEC: 50.0000 mg | DELAYED_RELEASE_TABLET | Freq: Two times a day (BID) | ORAL | 0 refills | Status: DC

## 2017-07-08 NOTE — ED Triage Notes (Signed)
Patient c/o left ankle pain that radiates into left shin. Denies any known injury. Per patient started "a little over a month ago." Per patient using ice and heat with no relief. Intermittent swelling per patient.

## 2017-07-08 NOTE — Discharge Instructions (Signed)
X-ray showed no abnormal findings.  Ankle brace, ice, elevate, medication for pain and inflammation.  Follow-up with orthopedic doctor if not getting better.  Phone number given.

## 2017-07-08 NOTE — ED Notes (Signed)
ED Provider at bedside. 

## 2017-07-10 NOTE — ED Provider Notes (Signed)
[facility] Provider Note   CSN: [id number] Arrival date & time: 07/08/17  [id number]     History   Chief Complaint Chief Complaint  Patient presents with  . Ankle Pain    HPI Joel Hebert is a 62 y.o. male.  Complains of pain on anterior aspect of left ankle for several weeks.  No trauma.  No fever, sweats, chills.  He has tried ice and heat with minimal relief.  There is some intermittent swelling in the joint.  Severity of symptoms is mild.  Nothing makes symptoms better or worse.      Past Medical History:  Diagnosis Date  . Hypertension   . MI (myocardial infarction) (HCC)     There are no active problems to display for this patient.   Past Surgical History:  Procedure Laterality Date  . CARDIAC SURGERY         Home Medications    Prior to Admission medications   Medication Sig Start Date End Date Taking? Authorizing Provider  diclofenac (VOLTAREN) 50 MG EC tablet Take 1 tablet (50 mg total) by mouth 2 (two) times daily. 07/08/17   Donnetta Hutchingook, Eryka Dolinger, MD    Family History Family History  Problem Relation Age of Onset  . Heart failure Brother     Social History Social History   Tobacco Use  . Smoking status: Current Every Day Smoker    Packs/day: 0.50    Years: 30.00    Pack years: 15.00    Types: Cigarettes  . Smokeless tobacco: Never Used  Substance Use Topics  . Alcohol use: Yes    Comment: occ  . Drug use: No     Allergies   Patient has no known allergies.   Review of Systems Review of Systems  All other systems reviewed and are negative.    Physical Exam Updated Vital Signs BP (!) 163/77   Pulse 80   Temp 97.6 F (36.4 C) (Oral)   Resp 16   Ht 5\' 7"  (1.702 m)   Wt 90.7 kg (200 lb)   SpO2 98%   BMI 31.32 kg/m   Physical Exam  Constitutional: He is oriented to person, place, and time. He appears well-developed and well-nourished.  HENT:  Head: Normocephalic and atraumatic.  Eyes: Conjunctivae  are normal.  Neck: Neck supple.  Musculoskeletal:  Left ankle: No swelling of the joint.  Minimal tenderness anteriorly.  Minimal pain with range of motion.  Neurological: He is alert and oriented to person, place, and time.  Skin: Skin is warm and dry.  Psychiatric: He has a normal mood and affect. His behavior is normal.  Nursing note and vitals reviewed.    ED Treatments / Results  Labs (all labs ordered are listed, but only abnormal results are displayed) Labs Reviewed - No data to display  EKG  EKG Interpretation None       Radiology No results found.  Procedures Procedures (including critical care time)  Medications Ordered in ED Medications - No data to display   Initial Impression / Assessment and Plan / ED Course  I have reviewed the triage vital signs and the nursing notes.  Pertinent labs & imaging results that were available during my care of the patient were reviewed by me and considered in my medical decision making (see chart for details).     Patient complaints of pain in the left anterior ankle.  No evidence of septic joint.  Plain films were negative.  Will  Rx Voltaren 50 mg bid  Final Clinical Impressions(s) / ED Diagnoses   Final diagnoses:  Left ankle pain, unspecified chronicity    ED Discharge Orders        Ordered    diclofenac (VOLTAREN) 50 MG EC tablet  2 times daily     07/08/17 1147       Donnetta Hutching, MD 07/10/17 1406

## 2017-07-26 ENCOUNTER — Encounter (HOSPITAL_COMMUNITY): Payer: Self-pay | Admitting: Physical Therapy

## 2017-07-26 ENCOUNTER — Ambulatory Visit (HOSPITAL_COMMUNITY): Payer: No Typology Code available for payment source | Attending: *Deleted | Admitting: Physical Therapy

## 2017-07-26 ENCOUNTER — Telehealth (HOSPITAL_COMMUNITY): Payer: Self-pay | Admitting: Physical Therapy

## 2017-07-26 DIAGNOSIS — X58XXXA Exposure to other specified factors, initial encounter: Secondary | ICD-10-CM | POA: Insufficient documentation

## 2017-07-26 DIAGNOSIS — M79671 Pain in right foot: Secondary | ICD-10-CM

## 2017-07-26 DIAGNOSIS — S91201S Unspecified open wound of right great toe with damage to nail, sequela: Secondary | ICD-10-CM

## 2017-07-26 DIAGNOSIS — M79672 Pain in left foot: Secondary | ICD-10-CM

## 2017-07-26 NOTE — Therapy (Signed)
[facility] [address] [place], [place], [address] Phone: [phone]   Fax:  [phone]  Wound Care Evaluation  Patient Details  Name: Joel Hebert MRN: [id number] Date of Birth: December 11, 1955 Referring Provider: Kizzie Furnish    Encounter Date: 07/26/2017  PT End of Session - 07/26/17 1648    Visit Number  1    Number of Visits  6    Date for PT Re-Evaluation  08/25/17    Authorization Type  none    Authorization - Visit Number  1    Authorization - Number of Visits  6    PT Start Time  0945    PT Stop Time  1030    PT Time Calculation (min)  45 min    Activity Tolerance  Patient tolerated treatment well    Behavior During Therapy  [facility] for tasks assessed/performed       Past Medical History:  Diagnosis Date  . Hypertension   . MI (myocardial infarction) [facility])     Past Surgical History:  Procedure Laterality Date  . CARDIAC SURGERY      There were no vitals filed for this visit.    [facility] PT Assessment - 07/26/17 0001      Assessment   Medical Diagnosis  B foot ulcers     Referring Provider  Rochelle Muse     Onset Date/Surgical Date  07/12/17    Next MD Visit  unknown    Prior Therapy  none      Precautions   Precautions  None      Restrictions   Weight Bearing Restrictions  No      Balance Screen   Has the patient fallen in the past 6 months  No    Has the patient had a decrease in activity level because of a fear of falling?   Yes    Is the patient reluctant to leave their home because of a fear of falling?   No      Home Environment   Living Environment  Private residence      Prior Function   Level of Independence  Independent    Vocation  Retired      Wound Therapy - 07/26/17 1012    Subjective  Joel Hebert states that if he walks very long his legs hurt and he has to stop for a while before he goes on.  He states that when he is up standing he is okay but when he lays down to go to bed he  has significant pain in both of his feet.  He states that he had blisters come up on his toes about two weeks ago so he began soaking them in bleach water which just made matters worse.      Patient and Family Stated Goals  decreased pain and wounds to heal.     Date of Onset  07/12/17    Prior Treatments  self care of soaking in bleach water.     Pain Assessment  No/denies pain    Pain Score  5  will go as high as a 10/10     Pain Location  Foot    Pain Orientation  Right;Left    Pain Descriptors / Indicators  Aching;Burning    Evaluation and Treatment Procedures Explained to Patient/Family  Yes    Evaluation and Treatment Procedures  agreed to    Wound Properties Date First Assessed: 07/26/17 Time First Assessed:  0950 Wound Type: Other (Comment) Location: Toe (Comment  which one) Location Orientation: Left Wound Description (Comments): blood blister on great toe and 5th toe on Lt foot no treatment as the area is not open  Present on Admission: Yes   Wound Properties Date First Assessed: 07/26/17 Time First Assessed: 0959 Wound Type: Other (Comment) Location: Toe (Comment  which one) Location Orientation: Right Wound Description (Comments): superior aspect of toe black 1.2 x 1 cm  Present on Admission: Yes   Dressing Type  None    Dressing Changed  New    Dressing Status  None    Site / Wound Assessment  Dry;Black    % Wound base Red or Granulating  0%    Wound Length (cm)  1.2 cm    Wound Width (cm)  1 cm    Wound Surface Area (cm^2)  1.2 cm^2    Drainage Amount  None    Treatment  Cleansed;Debridement (Selective)    Selective Debridement - Location  Rt great toe wound     Selective Debridement - Tools Used  Forceps    Selective Debridement - Tissue Removed  devitalized tissue     Wound Therapy - Clinical Statement  Mr. Tukes has decreased pedal pulse bilaterally, both LE are cold to the touch, he had sx of claudication all symptoms are pointing to decreased arterial flow.  Therapist  explained that the blisters on his left foot are not open and due to the fact that he most likely has decreased arterial flow we do not want to open them.  HIs blister on the RT side is open at this time so we want to make sure that this area stays clean and infection free but we do not want to be aggressive with debridement.  Pt was given medihoney and medipore tape and instructed to cleanse with soap and water daily, place a small amount of medihoney on the wound followed by gauze and medipore tape.  Pt advised to attempt to keep feet lower than the heart at all times.  Department of Health and Human Services was called I was unable to speak to anyone but I did leave a message that I felt that Mr. Galyean needed to be referred to a cardiovascular surgeon for possible B LE stents.  He has two cardiac stents at this time that were placed last year.      Wound Therapy - Functional Problem List  pain with walking     Factors Delaying/Impairing Wound Healing  Altered sensation;Diabetes Mellitus;Vascular compromise    Hydrotherapy Plan  Debridement;Dressing change;Patient/family education    Wound Therapy - Frequency  Other (comment) 1x a week     Wound Therapy - Current Recommendations  Surgery consult    Wound Therapy - Follow Up Recommendations  Other (comment)    Wound Plan  Pt will be seen one time a week to ensure a healing environment is obtained.  Pt states he has a cardiology appointment later this month but he does not know who it is with and this appointment is  a follow up for his cardiac stents.  Therapist researched MD appointment is with Dr. Darl Householder.  Cardiologist offfice was called and therapist vocalized her concern bout blood flow to pt B LE.  Nurse stateed she would make note for MD to check.  Therapist educated pt that she felt that he had blockages in his LE arteries and the chances of the pain or the wounds healing fully until this  is dealt with is minimal.  We will continue to see him one  time a week to debride any loose necrotic tissue off of his right toe and monitor the area for any infection.      Dressing   medihoney 2x2 and medipore tape.              Objective measurements completed on examination: See above findings.            PT Education - 07/26/17 1647    Education provided  Yes    Education Details  keep feet down as much as possible     Person(s) Educated  Patient    Methods  Explanation    Comprehension  Verbalized understanding       PT Short Term Goals - 07/26/17 1650      PT SHORT TERM GOAL #1   Title  Pt to have seen a cardiologist re ABI bilaterally     Time  2    Period  Weeks    Status  New    Target Date  08/09/17      PT SHORT TERM GOAL #2   Title  Wound  on Rt toe to only have 50% black escar    Time  3    Period  Weeks    Status  New    Target Date  08/16/17        PT Long Term Goals - 07/26/17 1652      PT LONG TERM GOAL #1   Title  Pt wound on Rt great toe to be healed     Time  6    Period  Weeks    Status  New    Target Date  09/06/17      PT LONG TERM GOAL #2   Title  Blisters on Lt great toe and little toe to be healed     Time  6    Period  Weeks    Status  New           Plan - 07/26/17 1649    Clinical Impression Statement  see above    Clinical Presentation  Stable    Clinical Decision Making  Low    Rehab Potential  Fair    PT Frequency  1x / week    PT Duration  6 weeks    PT Next Visit Plan  debride only superfical areas do not dig into wound until he has seen cardiovascular surgeon. Pt was given medihoney for self care he should bring it with him.    Consulted and Agree with Plan of Care  Patient       Patient will benefit from skilled therapeutic intervention in order to improve the following deficits and impairments:  Pain, Decreased skin integrity  Visit Diagnosis: Pain in left foot  Pain in right foot  Open wound of right great toe with damage to nail,  sequela    Problem List There are no active problems to display for this patient.   Virgina OrganCynthia Yarieliz Wasser, PT CLT [phone] 07/26/2017, 4:55 PM  [facility] [address] [place], [place], [address] Phone: [phone]   Fax:  [phone]  Name: Bronson CurbMelvin Robert J Rackers MRN: [id number] Date of Birth: 09/12/1955

## 2017-07-26 NOTE — Telephone Encounter (Signed)
Called Dr. Sharene SkeansKoneswaren's office [phone]) spoke to Olegario MessierKathy RN gave informaton with pt's approval as follows: [facility] Dr. Ilsa IhaKahalid Auousy - Phone [phone]. NF 07/26/17

## 2017-07-27 ENCOUNTER — Other Ambulatory Visit: Payer: Self-pay

## 2017-07-27 DIAGNOSIS — I739 Peripheral vascular disease, unspecified: Secondary | ICD-10-CM

## 2017-08-02 ENCOUNTER — Ambulatory Visit (HOSPITAL_COMMUNITY): Payer: No Typology Code available for payment source

## 2017-08-02 ENCOUNTER — Encounter (HOSPITAL_COMMUNITY): Payer: Self-pay

## 2017-08-02 DIAGNOSIS — S91201S Unspecified open wound of right great toe with damage to nail, sequela: Secondary | ICD-10-CM

## 2017-08-02 DIAGNOSIS — M79671 Pain in right foot: Secondary | ICD-10-CM

## 2017-08-02 DIAGNOSIS — M79672 Pain in left foot: Secondary | ICD-10-CM

## 2017-08-02 NOTE — Therapy (Signed)
[facility] [address] [place], [place], [address] Phone: [phone]   Fax:  [phone]  Physical Therapy Treatment  Patient Details  Name: Joel Hebert MRN: [id number] Date of Birth: 07/29/1955 Referring Provider: Kizzie Hebert    Encounter Date: 08/02/2017  PT End of Session - 08/02/17 1344    Visit Number  2    Number of Visits  6    Date for PT Re-Evaluation  08/25/17    Authorization Type  none    Authorization - Visit Number  2    Authorization - Number of Visits  6    PT Start Time  1302    PT Stop Time  1325    PT Time Calculation (min)  23 min    Activity Tolerance  Patient limited by pain    Behavior During Therapy  [facility] for tasks assessed/performed       Past Medical History:  Diagnosis Date  . Hypertension   . MI (myocardial infarction) [facility])     Past Surgical History:  Procedure Laterality Date  . CARDIAC SURGERY      There were no vitals filed for this visit.  Subjective Assessment - 08/02/17 1331    Subjective  Pt arrived with stating pain scale 9/10 Bil feet especially when laying flat on bed at home    Currently in Pain?  Yes    Pain Score  9     Pain Location  Toe (Comment which one) Rt and Lt great toe and Lt pinky toe    Pain Orientation  Right;Left                    Wound Therapy - 08/02/17 1331    Subjective  Pt arrived with stating pain scale 9/10 Bil feet especially when laying flat on bed at home    Patient and Family Stated Goals  decreased pain and wounds to heal.     Date of Onset  07/12/17    Prior Treatments  self care of soaking in bleach water.     Pain Assessment  0-10    Evaluation and Treatment Procedures Explained to Patient/Family  Yes    Evaluation and Treatment Procedures  agreed to    Wound Properties Date First Assessed: 07/26/17 Time First Assessed: 0950 Wound Type: Other (Comment) Location: Toe (Comment  which one) Location Orientation: Left Wound  Description (Comments): blood blister on great toe and 5th toe on Lt foot no treatment as the area is not open  Present on Admission: Yes   Dressing Type  -- medihoney and medipore tape    Dressing Changed  Changed    Dressing Status  Clean;Dry;Intact    Site / Wound Assessment  Black;Dry    % Wound base Red or Granulating  0%    % Wound base Black/Eschar  100%    Drainage Amount  Scant    Drainage Description  -- Noted puss from great toe nail    Treatment  Cleansed    Wound Properties Date First Assessed: 07/26/17 Time First Assessed: 0959 Wound Type: Other (Comment) Location: Toe (Comment  which one) Location Orientation: Right Wound Description (Comments): superior aspect of toe black 1.2 x 1 cm  Present on Admission: Yes   Dressing Type  -- medihoney and medipore tape    Dressing Changed  Changed    Dressing Status  None    Site / Wound Assessment  Dry;Black    %  Wound base Black/Eschar  100%    Drainage Amount  None    Treatment  Cleansed    Selective Debridement - Location  Rt great toe wound     Selective Debridement - Tools Used  Forceps    Selective Debridement - Tissue Removed  devitalized tissue     Wound Therapy - Clinical Statement  Pt arrived with dressings intact, limited by pain, minimal debridement complete this session.  Pt reports MD apt tomorrow to discuss possible need for stents for blood supply.  Noted puss from toe nails.  Continued wiht medihoney with medipore tape to address eschar.      Wound Therapy - Functional Problem List  pain with walking     Factors Delaying/Impairing Wound Healing  Altered sensation;Diabetes Mellitus;Vascular compromise    Hydrotherapy Plan  Debridement;Dressing change;Patient/family education    Wound Therapy - Frequency  -- 1x/week    Wound Therapy - Current Recommendations  Surgery consult    Wound Plan  Pt will be seen one time a week to ensure a healing environment is obtained.  Pt states he has a cardiology appointment later this  month but he does not know who it is with and this appointment is  a follow up for his cardiac stents.  Therapist researched MD appointment is with Dr. Darl Hebert.  Cardiologist offfice was called and therapist vocalized her concern bout blood flow to pt B LE.  Nurse stateed she would make note for MD to check.  Therapist educated pt that she felt that he had blockages in his LE arteries and the chances of the pain or the wounds healing fully until this is dealt with is minimal.  We will continue to see him one time a week to debride any loose necrotic tissue off of his right toe and monitor the area for any infection.      Dressing   medihoney 2x2 and medipore tape.                  PT Short Term Goals - 07/26/17 1650      PT SHORT TERM GOAL #1   Title  Pt to have seen a cardiologist re ABI bilaterally     Time  2    Period  Weeks    Status  New    Target Date  08/09/17      PT SHORT TERM GOAL #2   Title  Wound  on Rt toe to only have 50% black escar    Time  3    Period  Weeks    Status  New    Target Date  08/16/17        PT Long Term Goals - 07/26/17 1652      PT LONG TERM GOAL #1   Title  Pt wound on Rt great toe to be healed     Time  6    Period  Weeks    Status  New    Target Date  09/06/17      PT LONG TERM GOAL #2   Title  Blisters on Lt great toe and little toe to be healed     Time  6    Period  Weeks    Status  New              Patient will benefit from skilled therapeutic intervention in order to improve the following deficits and impairments:     Visit Diagnosis: Pain in left foot  Pain in right foot  Open wound of right great toe with damage to nail, sequela     Problem List There are no active problems to display for this patient.  [address] LPTA; CBIS [phone]  Joel Hebert 08/02/2017, 1:45 PM  [facility] [address] [place], [place], [address] Phone:  [phone]   Fax:  [phone]  Name: Joel Hebert MRN: [id number] Date of Birth: Jun 29, 1956

## 2017-08-03 ENCOUNTER — Encounter: Payer: Self-pay | Admitting: Cardiovascular Disease

## 2017-08-03 ENCOUNTER — Other Ambulatory Visit: Payer: Self-pay

## 2017-08-03 ENCOUNTER — Ambulatory Visit (INDEPENDENT_AMBULATORY_CARE_PROVIDER_SITE_OTHER): Payer: Self-pay | Admitting: Cardiovascular Disease

## 2017-08-03 VITALS — BP 182/84 | HR 84 | Ht 67.0 in | Wt 214.2 lb

## 2017-08-03 DIAGNOSIS — Z716 Tobacco abuse counseling: Secondary | ICD-10-CM

## 2017-08-03 DIAGNOSIS — I252 Old myocardial infarction: Secondary | ICD-10-CM

## 2017-08-03 DIAGNOSIS — I739 Peripheral vascular disease, unspecified: Secondary | ICD-10-CM

## 2017-08-03 DIAGNOSIS — I25118 Atherosclerotic heart disease of native coronary artery with other forms of angina pectoris: Secondary | ICD-10-CM

## 2017-08-03 DIAGNOSIS — Z955 Presence of coronary angioplasty implant and graft: Secondary | ICD-10-CM

## 2017-08-03 DIAGNOSIS — I10 Essential (primary) hypertension: Secondary | ICD-10-CM

## 2017-08-03 DIAGNOSIS — E782 Mixed hyperlipidemia: Secondary | ICD-10-CM

## 2017-08-03 DIAGNOSIS — E118 Type 2 diabetes mellitus with unspecified complications: Secondary | ICD-10-CM

## 2017-08-03 NOTE — Patient Instructions (Signed)
Your physician recommends that you schedule a follow-up appointment in:  3 months with Dr.Koneswaran    STOP Brilinta   STOP Ranexa    Get cholesterol checked in 2 months    No tests ordered today      Thank you for choosing [facility] !

## 2017-08-03 NOTE — Progress Notes (Signed)
CARDIOLOGY CONSULT NOTE  Patient ID: Joel Hebert MRN: [id number] DOB/AGE: 10-20-1955 62 y.o.  Admit date: (Not on file) Primary Physician: Vertis Kelch, NP.  Referring Physician: Vertis Kelch, NP.   Reason for Consultation: Coronary artery disease  HPI: Joel Hebert is a 62 y.o. male who is being seen today for the evaluation of coronary artery disease at the request of Vertis Kelch, NP.   I reviewed extensive documentation, labs, and studies from his PCP.  He was previously receiving cardiac care in [place], [place]. He is from [place], [place] and moved to [place] in May 2018.  He tells me he sustained myocardial infarctions in both 2016 and 2017.  He did not undergo percutaneous coronary intervention in 2016.  He underwent PCI of the ramus on 01/27/16 with a Xience Alpine 2.25 mm x 18 mm stent.  He also underwent stenting of the left circumflex with a Xience Alpine 2.5 mm x 33 mm stent.  I personally reviewed an ECG performed on 11/06/16 which demonstrated sinus rhythm with no ischemic ST segment or T wave abnormalities, nor any arrhythmias.  It appears he has a history of hypertension and type 2 diabetes mellitus.  I reviewed labs performed in December 2018: Total cholesterol 250, HDL 42, triglycerides 211, LDL 170, TSH 1.85.  Echocardiogram performed on 02/23/16 demonstrated normal left ventricular size, systolic function, and regional wall motion, LVEF 65%.  There is mild LVH.  There were no significant changes from her previous echocardiogram performed in July 2017.   He underwent a nuclear stress test on 03/20/16 which demonstrated a small, mildly intense, partially reversible inferior wall defect, LVEF 57%.  He wore a Holter monitor in December 2017 which showed normal sinus rhythm with rare PACs.  He currently denies chest pain, palpitations, orthopnea, exertional dyspnea, lightheadedness, dizziness, and syncope.  His primary  complaint relates to left leg pain.  He has had pain and swelling of both legs and feet, more so on the left.  He has significant pain in his left leg when elevating it and he is unable to sleep.  Symptoms began in the past 2-3 weeks.  He has been smoking 1/2 pack to a pack of cigarettes daily for the past 43 years.  He thinks he began taking Crestor in December 2018 and only recently began taking lisinopril-hydrochlorothiazide in the past 1 or 2 weeks.   No Known Allergies  Current Outpatient Medications  Medication Sig Dispense Refill  . aspirin EC 81 MG tablet Take 81 mg by mouth daily.    . diclofenac (VOLTAREN) 50 MG EC tablet Take 1 tablet (50 mg total) by mouth 2 (two) times daily. 20 tablet 0  . gabapentin (NEURONTIN) 300 MG capsule Take 300 mg by mouth 2 (two) times daily.    Marland Kitchen lisinopril-hydrochlorothiazide (PRINZIDE,ZESTORETIC) 20-25 MG tablet Take 1 tablet by mouth 2 (two) times daily.  2  . metFORMIN (GLUCOPHAGE) 850 MG tablet Take 850 mg by mouth 2 (two) times daily with a meal.    . metoprolol tartrate (LOPRESSOR) 100 MG tablet Take 100 mg by mouth 2 (two) times daily.    . nitroGLYCERIN (NITROSTAT) 0.4 MG SL tablet Place 0.4 mg under the tongue every 5 (five) minutes as needed for chest pain.    . rosuvastatin (CRESTOR) 20 MG tablet Take 20 mg by mouth daily.    . ranolazine (RANEXA) 500 MG 12 hr tablet Take 500 mg by mouth 2 (two)  times daily.    . ticagrelor (BRILINTA) 90 MG TABS tablet Take 90 mg by mouth 2 (two) times daily.     No current facility-administered medications for this visit.     Past Medical History:  Diagnosis Date  . Hypertension   . MI (myocardial infarction) [facility](HCC)     Past Surgical History:  Procedure Laterality Date  . CARDIAC SURGERY      Social History   Socioeconomic History  . Marital status: Widowed    Spouse name: Not on file  . Number of children: Not on file  . Years of education: Not on file  . Highest education level: Not on  file  Social Needs  . Financial resource strain: Not on file  . Food insecurity - worry: Not on file  . Food insecurity - inability: Not on file  . Transportation needs - medical: Not on file  . Transportation needs - non-medical: Not on file  Occupational History  . Not on file  Tobacco Use  . Smoking status: Current Every Day Smoker    Packs/day: 0.50    Years: 30.00    Pack years: 15.00    Types: Cigarettes    Start date: 08/12/1974  . Smokeless tobacco: Never Used  Substance and Sexual Activity  . Alcohol use: Yes    Comment: occ  . Drug use: No  . Sexual activity: Not on file  Other Topics Concern  . Not on file  Social History Narrative  . Not on file     No family history of premature CAD in 1st degree relatives.  Current Meds  Medication Sig  . aspirin EC 81 MG tablet Take 81 mg by mouth daily.  . diclofenac (VOLTAREN) 50 MG EC tablet Take 1 tablet (50 mg total) by mouth 2 (two) times daily.  Marland Kitchen. gabapentin (NEURONTIN) 300 MG capsule Take 300 mg by mouth 2 (two) times daily.  Marland Kitchen. lisinopril-hydrochlorothiazide (PRINZIDE,ZESTORETIC) 20-25 MG tablet Take 1 tablet by mouth 2 (two) times daily.  . metFORMIN (GLUCOPHAGE) 850 MG tablet Take 850 mg by mouth 2 (two) times daily with a meal.  . metoprolol tartrate (LOPRESSOR) 100 MG tablet Take 100 mg by mouth 2 (two) times daily.  . nitroGLYCERIN (NITROSTAT) 0.4 MG SL tablet Place 0.4 mg under the tongue every 5 (five) minutes as needed for chest pain.  . rosuvastatin (CRESTOR) 20 MG tablet Take 20 mg by mouth daily.  . [DISCONTINUED] lisinopril (PRINIVIL,ZESTRIL) 20 MG tablet Take 20 mg by mouth daily.      Review of systems complete and found to be negative unless listed above in HPI    Physical exam Blood pressure (!) 182/84, pulse 84, height 5\' 7"  (1.702 m), weight 214 lb 3.2 oz (97.2 kg), SpO2 99 %. General: NAD Neck: No JVD, no thyromegaly or thyroid nodule.  Lungs: Clear to auscultation bilaterally with normal  respiratory effort. CV: Nondisplaced PMI. Regular rate and rhythm, normal S1/S2, no S3/S4, no murmur. No carotid bruit.    Bilateral 1+ pitting lower extremity edema, left>right. Feet and tibial regions are cool to palpation. I am unable to palpate DP/PT pulses b/l. There is hair loss. There are bandaged wounds on both feet. Abdomen: Soft, nontender, no distention.  Skin: No rashes. Bandaged wounds on feet b/l. Neurologic: Alert and oriented x 3.  Psych: Normal affect. Extremities: No clubbing. HEENT: Normal.   ECG: Most recent ECG reviewed.   Labs: No results found for: K, BUN, CREATININE, ALT, TSH, HGB  Lipids: No results found for: LDLCALC, LDLDIRECT, CHOL, TRIG, HDL      ASSESSMENT AND PLAN:  1.  Coronary artery disease with history of non-STEMI and LAD stent: Symptomatically stable.  I will try to obtain cardiac catheterization repor.  He has stents to the ramus and left circumflex as detailed above.  He is currently on aspirin, lisinopril, metoprolol tartrate, and Crestor.  I will repeat lipids in 2 months.  2.  Hypertension: Blood pressure is markedly elevated.  However, he only recently started lisinopril-hydrochlorothiazide.  I will monitor to see if further antihypertensive titration is indicated.  3.  Hyperlipidemia: LDL markedly elevated as noted above.  He started Crestor in December 2018.  I will repeat lipids in 2 months.  4.  Type 2 diabetes mellitus: Currently on metformin 850 mg twice daily.  5.  Peripheral arterial disease of bilateral lower extremities with rest pain: He is currently on aspirin and statin.  He is scheduled to see vascular surgery with ABIs on February 20.  He needs an earlier evaluation.  I spoke to him at length about immediate tobacco cessation.  I will speak with my colleagues in vascular surgery to see if he can be evaluated sooner.  I told him should symptoms increase in intensity, to immediately go to the ED at [facility].   He will likely require percutaneous intervention if not bypass surgery.  I told him if he continues to smoke, he risks amputation.  6.  Tobacco abuse:  I spoke to him at length about immediate tobacco cessation (5 minutes).   Disposition: Follow up in 3 months   Signed: Prentice Docker, M.D., F.A.C.C.  08/03/2017, 9:35 AM

## 2017-08-05 ENCOUNTER — Encounter (HOSPITAL_COMMUNITY): Payer: Self-pay

## 2017-08-05 ENCOUNTER — Emergency Department (HOSPITAL_COMMUNITY)
Admission: EM | Admit: 2017-08-05 | Discharge: 2017-08-05 | Disposition: A | Discharge status: Home / Self Care | Payer: No Typology Code available for payment source | Attending: Emergency Medicine | Admitting: Emergency Medicine

## 2017-08-05 ENCOUNTER — Ambulatory Visit (HOSPITAL_COMMUNITY)
Admission: RE | Admit: 2017-08-05 | Discharge: 2017-08-05 | Disposition: A | Discharge status: Home / Self Care | Payer: No Typology Code available for payment source | Source: Ambulatory Visit | Attending: Emergency Medicine | Admitting: Emergency Medicine

## 2017-08-05 ENCOUNTER — Ambulatory Visit (HOSPITAL_COMMUNITY)
Admission: RE | Admit: 2017-08-05 | Discharge: 2017-08-05 | Disposition: A | Discharge status: Home / Self Care | Payer: Self-pay | Source: Ambulatory Visit | Attending: Emergency Medicine | Admitting: Emergency Medicine

## 2017-08-05 ENCOUNTER — Emergency Department (HOSPITAL_COMMUNITY): Payer: No Typology Code available for payment source

## 2017-08-05 ENCOUNTER — Other Ambulatory Visit: Payer: Self-pay

## 2017-08-05 ENCOUNTER — Other Ambulatory Visit (HOSPITAL_COMMUNITY): Payer: Self-pay | Admitting: Cardiology

## 2017-08-05 DIAGNOSIS — I252 Old myocardial infarction: Secondary | ICD-10-CM | POA: Insufficient documentation

## 2017-08-05 DIAGNOSIS — Z7982 Long term (current) use of aspirin: Secondary | ICD-10-CM | POA: Insufficient documentation

## 2017-08-05 DIAGNOSIS — M79609 Pain in unspecified limb: Secondary | ICD-10-CM

## 2017-08-05 DIAGNOSIS — M79605 Pain in left leg: Secondary | ICD-10-CM | POA: Insufficient documentation

## 2017-08-05 DIAGNOSIS — M79604 Pain in right leg: Secondary | ICD-10-CM | POA: Insufficient documentation

## 2017-08-05 DIAGNOSIS — Z79899 Other long term (current) drug therapy: Secondary | ICD-10-CM | POA: Insufficient documentation

## 2017-08-05 DIAGNOSIS — F17210 Nicotine dependence, cigarettes, uncomplicated: Secondary | ICD-10-CM | POA: Insufficient documentation

## 2017-08-05 DIAGNOSIS — M79672 Pain in left foot: Secondary | ICD-10-CM | POA: Insufficient documentation

## 2017-08-05 DIAGNOSIS — Z8679 Personal history of other diseases of the circulatory system: Secondary | ICD-10-CM

## 2017-08-05 DIAGNOSIS — Z7984 Long term (current) use of oral hypoglycemic drugs: Secondary | ICD-10-CM | POA: Insufficient documentation

## 2017-08-05 DIAGNOSIS — M7989 Other specified soft tissue disorders: Secondary | ICD-10-CM | POA: Insufficient documentation

## 2017-08-05 DIAGNOSIS — I10 Essential (primary) hypertension: Secondary | ICD-10-CM | POA: Insufficient documentation

## 2017-08-05 LAB — BASIC METABOLIC PANEL
Anion gap: 10 (ref 5–15)
BUN: 16 mg/dL (ref 6–20)
CHLORIDE: 103 mmol/L (ref 101–111)
CO2: 23 mmol/L (ref 22–32)
Calcium: 9.4 mg/dL (ref 8.9–10.3)
Creatinine, Ser: 1.2 mg/dL (ref 0.61–1.24)
GFR calc Af Amer: >60 mL/min (ref >60)
GFR calc non Af Amer: >60 mL/min (ref >60)
GLUCOSE: 145 mg/dL — AB (ref 65–99)
POTASSIUM: 4.2 mmol/L (ref 3.5–5.1)
Sodium: 136 mmol/L (ref 135–145)

## 2017-08-05 LAB — CBC WITH DIFFERENTIAL/PLATELET
Basophils Absolute: 0.1 10*3/uL (ref 0.0–0.1)
Basophils Relative: 1 %
EOS PCT: 6 %
Eosinophils Absolute: 0.4 10*3/uL (ref 0.0–0.7)
HCT: 36.3 % — ABNORMAL LOW (ref 39.0–52.0)
Hemoglobin: 11.8 g/dL — ABNORMAL LOW (ref 13.0–17.0)
LYMPHS ABS: 2.8 10*3/uL (ref 0.7–4.0)
LYMPHS PCT: 38 %
MCH: 27.7 pg (ref 26.0–34.0)
MCHC: 32.5 g/dL (ref 30.0–36.0)
MCV: 85.2 fL (ref 78.0–100.0)
MONO ABS: 0.4 10*3/uL (ref 0.1–1.0)
MONOS PCT: 6 %
Neutro Abs: 3.7 10*3/uL (ref 1.7–7.7)
Neutrophils Relative %: 49 %
PLATELETS: 222 10*3/uL (ref 150–400)
RBC: 4.26 MIL/uL (ref 4.22–5.81)
RDW: 13.3 % (ref 11.5–15.5)
WBC: 7.5 10*3/uL (ref 4.0–10.5)

## 2017-08-05 LAB — CBG MONITORING, ED: Glucose-Capillary: 146 mg/dL — ABNORMAL HIGH (ref 65–99)

## 2017-08-05 MED ORDER — HYDROCODONE-ACETAMINOPHEN 5-325 MG PO TABS: 1.0000 | ORAL_TABLET | Freq: Four times a day (QID) | ORAL | 0 refills | Status: DC | PRN

## 2017-08-05 NOTE — ED Triage Notes (Signed)
Pt states that he is having L foot pain around his ankle up to his shin, pt seen at [facility] and told he may have a blood clot, states he has appt with vascular on Friday but cant wait

## 2017-08-05 NOTE — Discharge Instructions (Signed)
Hydrocodone is prescribed as needed for pain.  Continue your other medications as previously prescribed.  Follow-up with vascular lab as to be scheduled for studies of your legs.

## 2017-08-05 NOTE — Progress Notes (Signed)
VASCULAR LAB PRELIMINARY  PRELIMINARY  PRELIMINARY  PRELIMINARY  Bilateral lower extremity venous duplex completed.    Preliminary report:  There is no DVT or SVT noted in the bilateral lower extremities.   Joel Hebert, RVT 08/05/2017, 9:07 AM

## 2017-08-05 NOTE — Progress Notes (Signed)
VASCULAR LAB PRELIMINARY  ARTERIAL  ABI completed:Right ABI indicates moderate reduction in arterial flow.  Left ABI not ascertained secondary to calcified vessels.     RIGHT    LEFT    PRESSURE WAVEFORM  PRESSURE WAVEFORM  BRACHIAL 194 T BRACHIAL 198 T  DP   DP    AT 66 DM AT 71 M  PT 133 M PT >254 M  PER   PER    GREAT TOE Bandages NA GREAT TOE Bandages NA    RIGHT LEFT  ABI 0.67 Non compressible/0.36     Joel Hebert, RVT 08/05/2017, 9:11 AM

## 2017-08-05 NOTE — ED Provider Notes (Signed)
[facility] Provider Note   CSN: [id number] Arrival date & time: 08/05/17  0000     History   Chief Complaint Chief Complaint  Patient presents with  . Foot Pain    HPI Bronson CurbMelvin Robert J Dickison is a 62 y.o. male.  Patient is a 62 year old male with past medical history of hypertension, coronary artery disease with prior MI and stents.  He presents with complaints of left foot pain.  He reports ongoing bilateral lower extremity discomfort for several months, worse over the past 2 weeks.  He was seen by his cardiologist who is made arrangements for him to undergo arterial studies of his legs.  This is scheduled for this Friday.  He presents tonight complaining of worsening pain in his left foot.  He states he is having difficulty sleeping secondary to the pain.  He was told that if it became worse, [facility].   The history is provided by the patient.  Foot Pain  This is a new problem. The problem occurs constantly. The problem has been gradually worsening. Pertinent negatives include no shortness of breath. Nothing aggravates the symptoms. Nothing relieves the symptoms. He has tried nothing for the symptoms.    Past Medical History:  Diagnosis Date  . Hypertension   . MI (myocardial infarction) (HCC)     There are no active problems to display for this patient.   Past Surgical History:  Procedure Laterality Date  . CARDIAC SURGERY         Home Medications    Prior to Admission medications   Medication Sig Start Date End Date Taking? Authorizing Provider  aspirin EC 81 MG tablet Take 81 mg by mouth daily.    [provider]  diclofenac (VOLTAREN) 50 MG EC tablet Take 1 tablet (50 mg total) by mouth 2 (two) times daily. 07/08/17   Donnetta Hutchingook, Brian, MD  gabapentin (NEURONTIN) 300 MG capsule Take 300 mg by mouth 2 (two) times daily.    [provider]  lisinopril-hydrochlorothiazide  (PRINZIDE,ZESTORETIC) 20-25 MG tablet Take 1 tablet by mouth 2 (two) times daily. 07/24/17   [provider]  metFORMIN (GLUCOPHAGE) 850 MG tablet Take 850 mg by mouth 2 (two) times daily with a meal.    [provider]  metoprolol tartrate (LOPRESSOR) 100 MG tablet Take 100 mg by mouth 2 (two) times daily.    [provider]  nitroGLYCERIN (NITROSTAT) 0.4 MG SL tablet Place 0.4 mg under the tongue every 5 (five) minutes as needed for chest pain.    [provider]  rosuvastatin (CRESTOR) 20 MG tablet Take 20 mg by mouth daily.    [provider]    Family History Family History  Problem Relation Age of Onset  . Heart failure Brother     Social History Social History   Tobacco Use  . Smoking status: Current Every Day Smoker    Packs/day: 0.50    Years: 30.00    Pack years: 15.00    Types: Cigarettes    Start date: 08/12/1974  . Smokeless tobacco: Never Used  Substance Use Topics  . Alcohol use: Yes    Comment: occ  . Drug use: No     Allergies   Patient has no known allergies.   Review of Systems Review of Systems  Respiratory: Negative for shortness of breath.   All other systems reviewed and are negative.    Physical Exam Updated Vital Signs  BP (!) 191/89   Pulse 76   Temp (!) 97.4 F (36.3 C)   Resp 18   SpO2 100%   Physical Exam  Constitutional: He is oriented to person, place, and time. He appears well-developed and well-nourished. No distress.  HENT:  Head: Normocephalic and atraumatic.  Mouth/Throat: Oropharynx is clear and moist.  Neck: Normal range of motion. Neck supple.  Cardiovascular: Normal rate and regular rhythm. Exam reveals no friction rub.  No murmur heard. Pulmonary/Chest: Effort normal and breath sounds normal. No respiratory distress. He has no wheezes. He has no rales.  Abdominal: Soft. Bowel sounds are normal. He exhibits no distension. There is no tenderness.  Musculoskeletal: Normal range  of motion. He exhibits edema.  The bilateral lower extremities are noted to have 1+ edema.  There is discoloration and drainage from the left fifth toe.  He also has areas of swelling, inflammation, and dark tissue adjacent to the nail of both great toes.  The feet do not feel cool to the touch.  I am unable to palpate DP pulses, however these are dopplerable.  Neurological: He is alert and oriented to person, place, and time. Coordination normal.  Skin: Skin is warm and dry. He is not diaphoretic.  Nursing note and vitals reviewed.    ED Treatments / Results  Labs (all labs ordered are listed, but only abnormal results are displayed) Labs Reviewed  CBG MONITORING, ED - Abnormal; Notable for the following components:      Result Value   Glucose-Capillary 146 (*)    All other components within normal limits  BASIC METABOLIC PANEL  CBC WITH DIFFERENTIAL/PLATELET    EKG  EKG Interpretation None       Radiology No results found.  Procedures Procedures (including critical care time)  Medications Ordered in ED Medications - No data to display   Initial Impression / Assessment and Plan / ED Course  I have reviewed the triage vital signs and the nursing notes.  Pertinent labs & imaging results that were available during my care of the patient were reviewed by me and considered in my medical decision making (see chart for details).  Patient presents with bilateral leg pain, left greater than right.  He has a history of peripheral vascular disease and is scheduled for an upcoming vascular ultrasound with TBI this Friday.  He presents tonight with increased pain in his left foot.  Pulses are dopplerable in the ED.  The foot does not feel cool or appear dusky.  His x-rays also do not show osteomyelitis on the various sores noted to his toes.  He will be treated with Augmentin, pain medication, and will have arrangements made for arterial and venous studies of his lower extremities.   As vascular lab is not open this time of day, he will have these studies performed as an outpatient.  Final Clinical Impressions(s) / ED Diagnoses   Final diagnoses:  None    ED Discharge Orders    None       Geoffery Lyons, MD 08/05/17 [phone]

## 2017-08-08 ENCOUNTER — Ambulatory Visit (HOSPITAL_COMMUNITY): Payer: No Typology Code available for payment source | Admitting: Physical Therapy

## 2017-08-08 DIAGNOSIS — M79671 Pain in right foot: Secondary | ICD-10-CM

## 2017-08-08 DIAGNOSIS — M79672 Pain in left foot: Secondary | ICD-10-CM

## 2017-08-08 DIAGNOSIS — S91201S Unspecified open wound of right great toe with damage to nail, sequela: Secondary | ICD-10-CM

## 2017-08-08 NOTE — Therapy (Signed)
[place] [facility] [address] [place], [place], [address] Phone: [phone]   Fax:  [phone]  Wound Care Therapy  Patient Details  Name: Joel Hebert MRN: [id number] Date of Birth: Dec 24, 1955 Referring Provider: Kizzie Furnish    Encounter Date: 08/08/2017  PT End of Session - 08/08/17 1432    Visit Number  3    Number of Visits  6    Date for PT Re-Evaluation  08/25/17    Authorization Type  none    Authorization - Visit Number  3    Authorization - Number of Visits  6    PT Start Time  1345    PT Stop Time  1400    PT Time Calculation (min)  15 min    Activity Tolerance  Patient limited by pain    Behavior During Therapy  [facility] for tasks assessed/performed       Past Medical History:  Diagnosis Date  . Hypertension   . MI (myocardial infarction) [facility])     Past Surgical History:  Procedure Laterality Date  . CARDIAC SURGERY      There were no vitals filed for this visit.              Wound Therapy - 08/08/17 1419    Subjective  PT states he hurt so bad over the weekend, he went to the ED and they completed studies, including ABI's of Bil LE's.  Has appt with vascular surgeon this Friday.    Patient and Family Stated Goals  decreased pain and wounds to heal.     Pain Assessment  0-10    Pain Score  0-No pain    Pain Location  Toe (Comment which one) Both great toes and Lt small toe    Pain Orientation  Right;Left    Evaluation and Treatment Procedures Explained to Patient/Family  Yes    Evaluation and Treatment Procedures  agreed to    Wound Properties Date First Assessed: 07/26/17 Time First Assessed: 0950 Wound Type: Other (Comment) Location: Toe (Comment  which one) Location Orientation: Left Wound Description (Comments): blood blister on great toe and 5th toe on Lt foot no treatment as the area is not open  Present on Admission: Yes   Dressing Type  Gauze (Comment);Tape dressing medihoney gel, 2X2 and  medipore tape    Dressing Changed  Changed    Dressing Status  Clean;Dry;Intact    Site / Wound Assessment  Black;Dry    % Wound base Red or Granulating  0%    % Wound base Black/Eschar  100%    Drainage Amount  None    Treatment  Cleansed    Wound Properties Date First Assessed: 07/26/17 Time First Assessed: 0959 Wound Type: Other (Comment) Location: Toe (Comment  which one) Location Orientation: Right Wound Description (Comments): superior aspect of toe black 1.2 x 1 cm  Present on Admission: Yes   Dressing Type  Gauze (Comment);Tape dressing medihoney gel, 2X2, medipore tape     Dressing Changed  Changed    Dressing Status  None    Site / Wound Assessment  Dry;Black    % Wound base Red or Granulating  0%    % Wound base Black/Eschar  100%    Wound Length (cm)  1.2 cm    Wound Width (cm)  1 cm    Wound Surface Area (cm^2)  1.2 cm^2    Drainage Amount  None    Treatment  Cleansed    Wound Therapy - Clinical Statement  wounds remain 100% necrotic without change due to poor arterial blood flow.  Pt had ABI's completed wtih resultant severe calcifications on Lt (0.36) and mild to moderate on Rt (0.67).  Wounds remain very tender and difficult to debride. Instructed pateint to keep bandaged  with hopes he will receive treatment and we can begin debridement.     Wound Plan  Continue to provide dressings/woundcare as appropriate.  Await further instructions; results from Vascular appt.                  PT Short Term Goals - 07/26/17 1650      PT SHORT TERM GOAL #1   Title  Pt to have seen a cardiologist re ABI bilaterally     Time  2    Period  Weeks    Status  New    Target Date  08/09/17      PT SHORT TERM GOAL #2   Title  Wound  on Rt toe to only have 50% black escar    Time  3    Period  Weeks    Status  New    Target Date  08/16/17        PT Long Term Goals - 07/26/17 1652      PT LONG TERM GOAL #1   Title  Pt wound on Rt great toe to be healed     Time  6     Period  Weeks    Status  New    Target Date  09/06/17      PT LONG TERM GOAL #2   Title  Blisters on Lt great toe and little toe to be healed     Time  6    Period  Weeks    Status  New              Patient will benefit from skilled therapeutic intervention in order to improve the following deficits and impairments:     Visit Diagnosis: Pain in left foot  Pain in right foot  Open wound of right great toe with damage to nail, sequela     Problem List There are no active problems to display for this patient.  Lurena Nidamy B Conlan Miceli, PTA/CLT [phone]  Lurena NidaFrazier, Mickie Badders B 08/08/2017, 2:34 PM  [facility] [address] [place], [place], [address] Phone: [phone]   Fax:  [phone]  Name: Joel Hebert MRN: [id number] Date of Birth: 12/21/1955

## 2017-08-10 ENCOUNTER — Ambulatory Visit (HOSPITAL_COMMUNITY)
Admission: RE | Admit: 2017-08-10 | Discharge: 2017-08-10 | Disposition: A | Discharge status: Home / Self Care | Payer: Self-pay | Source: Ambulatory Visit | Attending: Vascular Surgery | Admitting: Vascular Surgery

## 2017-08-10 ENCOUNTER — Other Ambulatory Visit: Payer: Self-pay | Admitting: *Deleted

## 2017-08-10 ENCOUNTER — Encounter: Payer: Self-pay | Admitting: *Deleted

## 2017-08-10 ENCOUNTER — Encounter: Payer: Self-pay | Admitting: Vascular Surgery

## 2017-08-10 ENCOUNTER — Encounter (HOSPITAL_COMMUNITY): Payer: Self-pay

## 2017-08-10 ENCOUNTER — Ambulatory Visit (INDEPENDENT_AMBULATORY_CARE_PROVIDER_SITE_OTHER): Payer: Medicaid Other | Admitting: Vascular Surgery

## 2017-08-10 VITALS — BP 170/85 | HR 81 | Temp 98.1°F | Resp 18 | Ht 67.0 in | Wt 215.3 lb

## 2017-08-10 DIAGNOSIS — I739 Peripheral vascular disease, unspecified: Secondary | ICD-10-CM

## 2017-08-10 MED ORDER — SULFAMETHOXAZOLE-TRIMETHOPRIM 400-80 MG PO TABS: 1.0000 | ORAL_TABLET | Freq: Two times a day (BID) | ORAL | 0 refills | Status: DC

## 2017-08-10 NOTE — H&P (View-Only) (Signed)
 Patient ID: Joel Hebert, male   DOB: 01/09/1956, 61 y.o.   MRN: [id number]  Reason for Consult: New Patient (Initial Visit) (eval claudication, decreased pedal pulse/blisters on toes)   Referred by Muse, Rochelle D., PA-C  Subjective:     HPI:  Joel Hebert is a 61 y.o. male with a history of diabetes as well as heart disease recently noted a blister on his right great toe after wearing steel toed boots.  He now has similar ulceration on his left great and baby toes.  He has been taking gabapentin and this has not helped.  He also has associated swelling of his legs but denies any fevers or chills.  He does have a history of coronary stenting most recently in November for which she is taking Brilinta.  He has never had issues like this before.  He has not been started on antibiotics.  He does walk without limitation he says was having some leg pain prior to developing these ulcers.  Past Medical History:  Diagnosis Date  . Diabetes mellitus without complication (HCC)   . Hypertension   . MI (myocardial infarction) (HCC)   . Peripheral vascular disease (HCC)    Family History  Problem Relation Age of Onset  . Heart failure Brother   . Heart disease Brother    Past Surgical History:  Procedure Laterality Date  . CARDIAC SURGERY      Short Social History:  Social History   Tobacco Use  . Smoking status: Current Every Day Smoker    Packs/day: 0.50    Years: 30.00    Pack years: 15.00    Types: Cigarettes    Start date: 08/12/1974  . Smokeless tobacco: Never Used  Substance Use Topics  . Alcohol use: Yes    Comment: occ    No Known Allergies  Current Outpatient Medications  Medication Sig Dispense Refill  . aspirin EC 81 MG tablet Take 81 mg by mouth daily.    . diclofenac (VOLTAREN) 50 MG EC tablet Take 1 tablet (50 mg total) by mouth 2 (two) times daily. 20 tablet 0  . gabapentin (NEURONTIN) 300 MG capsule Take 300 mg by mouth 2 (two) times daily.      . HYDROcodone-acetaminophen (NORCO) 5-325 MG tablet Take 1-2 tablets by mouth every 6 (six) hours as needed. 20 tablet 0  . lisinopril-hydrochlorothiazide (PRINZIDE,ZESTORETIC) 20-25 MG tablet Take 1 tablet by mouth 2 (two) times daily.  2  . metFORMIN (GLUCOPHAGE) 850 MG tablet Take 850 mg by mouth 2 (two) times daily with a meal.    . metoprolol tartrate (LOPRESSOR) 100 MG tablet Take 100 mg by mouth 2 (two) times daily.    . nitroGLYCERIN (NITROSTAT) 0.4 MG SL tablet Place 0.4 mg under the tongue every 5 (five) minutes as needed for chest pain.    . rosuvastatin (CRESTOR) 20 MG tablet Take 20 mg by mouth daily.    . sulfamethoxazole-trimethoprim (BACTRIM,SEPTRA) 400-80 MG tablet Take 1 tablet by mouth 2 (two) times daily. 20 tablet 0   No current facility-administered medications for this visit.     Review of Systems  Constitutional:  Constitutional negative. Respiratory: Respiratory negative.  Cardiovascular: Positive for leg swelling.  GI: Gastrointestinal negative.  Musculoskeletal: Positive for leg pain.  Skin: Positive for wound.  Neurological: Positive for numbness.  Hematologic: Hematologic/lymphatic negative.  Psychiatric: Psychiatric negative.        Objective:  Objective   Vitals:   08/10/17 1052 08/10/17   1055  BP: (!) 173/85 (!) 170/85  Pulse: 81   Resp: 18   Temp: 98.1 F (36.7 C)   TempSrc: Oral   SpO2: 99%   Weight: 215 lb 4.8 oz (97.7 kg)   Height: 5' 7" (1.702 m)    Body mass index is 33.72 kg/m.  Physical Exam  Constitutional: He is oriented to person, place, and time. He appears well-developed.  HENT:  Head: Normocephalic.  Eyes: Pupils are equal, round, and reactive to light.  Neck: Normal range of motion.  Cardiovascular: Normal rate.  Pulses:      Femoral pulses are 2+ on the right side, and 2+ on the left side. Musculoskeletal: Normal range of motion. He exhibits no edema.  Neurological: He is alert and oriented to person, place, and  time.  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 His behavior is normal. Judgment and thought content normal.    Data:      Assessment/Plan:     61-year-old male with history of diabetes current smoking heart disease on Brilinta.  He now has bilateral lower extremity wounds does have palpable femoral pulses bilaterally but nothing below the.  His ABIs are significantly depressed as well.  He will need angiogram probably starting from the right side artery to the left side first given that he has 2 toes affected on the left.  I have also sent antibiotics to his pharmacy today.  This procedure is being scheduled for early next week and he may also need to be admitted for antibiotics and possible toe amputations if his feet are worse at that time.  May also need to stay to have the other leg treated later in the week.  He demonstrates good understanding we will get him scheduled today.     Albirda Shiel Christopher Jamilynn Whitacre MD [facility]  

## 2017-08-10 NOTE — Progress Notes (Signed)
 Patient ID: Joel Hebert, male   DOB: 12/31/1955, 62 y.o.   MRN: [id number]  Reason for Consult: New Patient (Initial Visit) (eval claudication, decreased pedal pulse/blisters on toes)   Referred by Muse, Rochelle D., PA-C  Subjective:     HPI:  Joel Hebert is a 62 y.o. male with a history of diabetes as well as heart disease recently noted a blister on his right great toe after wearing steel toed boots.  He now has similar ulceration on his left great and baby toes.  He has been taking gabapentin and this has not helped.  He also has associated swelling of his legs but denies any fevers or chills.  He does have a history of coronary stenting most recently in November for which she is taking Brilinta.  He has never had issues like this before.  He has not been started on antibiotics.  He does walk without limitation he says was having some leg pain prior to developing these ulcers.  Past Medical History:  Diagnosis Date  . Diabetes mellitus without complication (HCC)   . Hypertension   . MI (myocardial infarction) (HCC)   . Peripheral vascular disease (HCC)    Family History  Problem Relation Age of Onset  . Heart failure Brother   . Heart disease Brother    Past Surgical History:  Procedure Laterality Date  . CARDIAC SURGERY      Short Social History:  Social History   Tobacco Use  . Smoking status: Current Every Day Smoker    Packs/day: 0.50    Years: 30.00    Pack years: 15.00    Types: Cigarettes    Start date: 08/12/1974  . Smokeless tobacco: Never Used  Substance Use Topics  . Alcohol use: Yes    Comment: occ    No Known Allergies  Current Outpatient Medications  Medication Sig Dispense Refill  . aspirin EC 81 MG tablet Take 81 mg by mouth daily.    . diclofenac (VOLTAREN) 50 MG EC tablet Take 1 tablet (50 mg total) by mouth 2 (two) times daily. 20 tablet 0  . gabapentin (NEURONTIN) 300 MG capsule Take 300 mg by mouth 2 (two) times daily.      . HYDROcodone-acetaminophen (NORCO) 5-325 MG tablet Take 1-2 tablets by mouth every 6 (six) hours as needed. 20 tablet 0  . lisinopril-hydrochlorothiazide (PRINZIDE,ZESTORETIC) 20-25 MG tablet Take 1 tablet by mouth 2 (two) times daily.  2  . metFORMIN (GLUCOPHAGE) 850 MG tablet Take 850 mg by mouth 2 (two) times daily with a meal.    . metoprolol tartrate (LOPRESSOR) 100 MG tablet Take 100 mg by mouth 2 (two) times daily.    . nitroGLYCERIN (NITROSTAT) 0.4 MG SL tablet Place 0.4 mg under the tongue every 5 (five) minutes as needed for chest pain.    . rosuvastatin (CRESTOR) 20 MG tablet Take 20 mg by mouth daily.    . sulfamethoxazole-trimethoprim (BACTRIM,SEPTRA) 400-80 MG tablet Take 1 tablet by mouth 2 (two) times daily. 20 tablet 0   No current facility-administered medications for this visit.     Review of Systems  Constitutional:  Constitutional negative. Respiratory: Respiratory negative.  Cardiovascular: Positive for leg swelling.  GI: Gastrointestinal negative.  Musculoskeletal: Positive for leg pain.  Skin: Positive for wound.  Neurological: Positive for numbness.  Hematologic: Hematologic/lymphatic negative.  Psychiatric: Psychiatric negative.        Objective:  Objective   Vitals:   08/10/17 1052 08/10/17   1055  BP: (!) 173/85 (!) 170/85  Pulse: 81   Resp: 18   Temp: 98.1 F (36.7 C)   TempSrc: Oral   SpO2: 99%   Weight: 215 lb 4.8 oz (97.7 kg)   Height: 5' 7" (1.702 m)    Body mass index is 33.72 kg/m.  Physical Exam  Constitutional: He is oriented to person, place, and time. He appears well-developed.  HENT:  Head: Normocephalic.  Eyes: Pupils are equal, round, and reactive to light.  Neck: Normal range of motion.  Cardiovascular: Normal rate.  Pulses:      Femoral pulses are 2+ on the right side, and 2+ on the left side. Musculoskeletal: Normal range of motion. He exhibits no edema.  Neurological: He is alert and oriented to person, place, and  time.  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 His behavior is normal. Judgment and thought content normal.    Data:      Assessment/Plan:     62-year-old male with history of diabetes current smoking heart disease on Brilinta.  He now has bilateral lower extremity wounds does have palpable femoral pulses bilaterally but nothing below the.  His ABIs are significantly depressed as well.  He will need angiogram probably starting from the right side artery to the left side first given that he has 2 toes affected on the left.  I have also sent antibiotics to his pharmacy today.  This procedure is being scheduled for early next week and he may also need to be admitted for antibiotics and possible toe amputations if his feet are worse at that time.  May also need to stay to have the other leg treated later in the week.  He demonstrates good understanding we will get him scheduled today.     Atharva Mirsky Christopher Karlina Suares MD [facility]  

## 2017-08-10 NOTE — H&P (View-Only) (Signed)
Patient ID: Joel Hebert, male   DOB: 05/28/1956, 62 y.o.   MRN: [id number]  Reason for Consult: New Patient (Initial Visit) (eval claudication, decreased pedal pulse/blisters on toes)   Referred by Kizzie FurnishMuse, Rochelle D., PA-C  Subjective:     HPI:  Joel Hebert is a 62 y.o. male with a history of diabetes as well as heart disease recently noted a blister on his right great toe after wearing steel toed boots.  He now has similar ulceration on his left great and baby toes.  He has been taking gabapentin and this has not helped.  He also has associated swelling of his legs but denies any fevers or chills.  He does have a history of coronary stenting most recently in November for which she is taking Brilinta.  He has never had issues like this before.  He has not been started on antibiotics.  He does walk without limitation he says was having some leg pain prior to developing these ulcers.  Past Medical History:  Diagnosis Date  . Diabetes mellitus without complication (HCC)   . Hypertension   . MI (myocardial infarction) (HCC)   . Peripheral vascular disease (HCC)    Family History  Problem Relation Age of Onset  . Heart failure Brother   . Heart disease Brother    Past Surgical History:  Procedure Laterality Date  . CARDIAC SURGERY      Short Social History:  Social History   Tobacco Use  . Smoking status: Current Every Day Smoker    Packs/day: 0.50    Years: 30.00    Pack years: 15.00    Types: Cigarettes    Start date: 08/12/1974  . Smokeless tobacco: Never Used  Substance Use Topics  . Alcohol use: Yes    Comment: occ    No Known Allergies  Current Outpatient Medications  Medication Sig Dispense Refill  . aspirin EC 81 MG tablet Take 81 mg by mouth daily.    . diclofenac (VOLTAREN) 50 MG EC tablet Take 1 tablet (50 mg total) by mouth 2 (two) times daily. 20 tablet 0  . gabapentin (NEURONTIN) 300 MG capsule Take 300 mg by mouth 2 (two) times daily.      Marland Kitchen. HYDROcodone-acetaminophen (NORCO) 5-325 MG tablet Take 1-2 tablets by mouth every 6 (six) hours as needed. 20 tablet 0  . lisinopril-hydrochlorothiazide (PRINZIDE,ZESTORETIC) 20-25 MG tablet Take 1 tablet by mouth 2 (two) times daily.  2  . metFORMIN (GLUCOPHAGE) 850 MG tablet Take 850 mg by mouth 2 (two) times daily with a meal.    . metoprolol tartrate (LOPRESSOR) 100 MG tablet Take 100 mg by mouth 2 (two) times daily.    . nitroGLYCERIN (NITROSTAT) 0.4 MG SL tablet Place 0.4 mg under the tongue every 5 (five) minutes as needed for chest pain.    . rosuvastatin (CRESTOR) 20 MG tablet Take 20 mg by mouth daily.    Marland Kitchen. sulfamethoxazole-trimethoprim (BACTRIM,SEPTRA) 400-80 MG tablet Take 1 tablet by mouth 2 (two) times daily. 20 tablet 0   No current facility-administered medications for this visit.     Review of Systems  Constitutional:  Constitutional negative. Respiratory: Respiratory negative.  Cardiovascular: Positive for leg swelling.  GI: Gastrointestinal negative.  Musculoskeletal: Positive for leg pain.  Skin: Positive for wound.  Neurological: Positive for numbness.  Hematologic: Hematologic/lymphatic negative.  Psychiatric: Psychiatric negative.        Objective:  Objective   Vitals:   08/10/17 1052 08/10/17  1055  BP: (!) 173/85 (!) 170/85  Pulse: 81   Resp: 18   Temp: 98.1 F (36.7 C)   TempSrc: Oral   SpO2: 99%   Weight: 215 lb 4.8 oz (97.7 kg)   Height: 5\' 7"  (1.702 m)    Body mass index is 33.72 kg/m.  Physical Exam  Constitutional: He is oriented to person, place, and time. He appears well-developed.  HENT:  Head: Normocephalic.  Eyes: Pupils are equal, round, and reactive to light.  Neck: Normal range of motion.  Cardiovascular: Normal rate.  Pulses:      Femoral pulses are 2+ on the right side, and 2+ on the left side. Musculoskeletal: Normal range of motion. He exhibits no edema.  Neurological: He is alert and oriented to person, place, and  time.  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 His behavior is normal. Judgment and thought content normal.    Data:      Assessment/Plan:     62 year old male with history of diabetes current smoking heart disease on Brilinta.  He now has bilateral lower extremity wounds does have palpable femoral pulses bilaterally but nothing below the.  His ABIs are significantly depressed as well.  He will need angiogram probably starting from the right side artery to the left side first given that he has 2 toes affected on the left.  I have also sent antibiotics to his pharmacy today.  This procedure is being scheduled for early next week and he may also need to be admitted for antibiotics and possible toe amputations if his feet are worse at that time.  May also need to stay to have the other leg treated later in the week.  He demonstrates good understanding we will get him scheduled today.     Maeola Harman MD [facility]

## 2017-08-14 ENCOUNTER — Other Ambulatory Visit: Payer: Self-pay

## 2017-08-14 ENCOUNTER — Ambulatory Visit (HOSPITAL_COMMUNITY)
Admission: RE | Disposition: A | Discharge status: Home / Self Care | Payer: Self-pay | Source: Ambulatory Visit | Attending: Surgery

## 2017-08-14 ENCOUNTER — Encounter (HOSPITAL_COMMUNITY): Payer: Self-pay | Admitting: General Practice

## 2017-08-14 ENCOUNTER — Ambulatory Visit (HOSPITAL_COMMUNITY)
Admission: RE | Admit: 2017-08-14 | Discharge: 2017-08-15 | Disposition: A | Discharge status: Home / Self Care | Payer: Medicaid Other | Source: Ambulatory Visit | Attending: Surgery | Admitting: Surgery

## 2017-08-14 DIAGNOSIS — I739 Peripheral vascular disease, unspecified: Secondary | ICD-10-CM | POA: Diagnosis present

## 2017-08-14 DIAGNOSIS — F17210 Nicotine dependence, cigarettes, uncomplicated: Secondary | ICD-10-CM | POA: Diagnosis not present

## 2017-08-14 DIAGNOSIS — L97519 Non-pressure chronic ulcer of other part of right foot with unspecified severity: Secondary | ICD-10-CM | POA: Insufficient documentation

## 2017-08-14 DIAGNOSIS — I10 Essential (primary) hypertension: Secondary | ICD-10-CM | POA: Insufficient documentation

## 2017-08-14 DIAGNOSIS — E1151 Type 2 diabetes mellitus with diabetic peripheral angiopathy without gangrene: Secondary | ICD-10-CM | POA: Diagnosis not present

## 2017-08-14 DIAGNOSIS — I7092 Chronic total occlusion of artery of the extremities: Secondary | ICD-10-CM | POA: Diagnosis not present

## 2017-08-14 DIAGNOSIS — I252 Old myocardial infarction: Secondary | ICD-10-CM | POA: Insufficient documentation

## 2017-08-14 DIAGNOSIS — E11621 Type 2 diabetes mellitus with foot ulcer: Secondary | ICD-10-CM | POA: Diagnosis not present

## 2017-08-14 DIAGNOSIS — I70235 Atherosclerosis of native arteries of right leg with ulceration of other part of foot: Secondary | ICD-10-CM | POA: Insufficient documentation

## 2017-08-14 DIAGNOSIS — Z7984 Long term (current) use of oral hypoglycemic drugs: Secondary | ICD-10-CM | POA: Insufficient documentation

## 2017-08-14 DIAGNOSIS — I70245 Atherosclerosis of native arteries of left leg with ulceration of other part of foot: Secondary | ICD-10-CM | POA: Insufficient documentation

## 2017-08-14 DIAGNOSIS — Z7902 Long term (current) use of antithrombotics/antiplatelets: Secondary | ICD-10-CM | POA: Insufficient documentation

## 2017-08-14 DIAGNOSIS — Z7982 Long term (current) use of aspirin: Secondary | ICD-10-CM | POA: Diagnosis not present

## 2017-08-14 DIAGNOSIS — L97529 Non-pressure chronic ulcer of other part of left foot with unspecified severity: Secondary | ICD-10-CM | POA: Insufficient documentation

## 2017-08-14 HISTORY — DX: Peripheral vascular disease, unspecified: I73.9

## 2017-08-14 HISTORY — DX: Atherosclerotic heart disease of native coronary artery without angina pectoris: I25.10

## 2017-08-14 HISTORY — PX: PERIPHERAL VASCULAR BALLOON ANGIOPLASTY: CATH118281

## 2017-08-14 HISTORY — PX: PERIPHERAL VASCULAR ATHERECTOMY: CATH118256

## 2017-08-14 HISTORY — PX: PERIPHERAL VASCULAR INTERVENTION: CATH118257

## 2017-08-14 HISTORY — DX: Pure hypercholesterolemia, unspecified: E78.00

## 2017-08-14 HISTORY — DX: Type 2 diabetes mellitus without complications: E11.9

## 2017-08-14 HISTORY — PX: ABDOMINAL AORTOGRAM W/LOWER EXTREMITY: CATH118223

## 2017-08-14 LAB — POCT I-STAT, CHEM 8
BUN: 28 mg/dL — AB (ref 6–20)
CHLORIDE: 104 mmol/L (ref 101–111)
CREATININE: 1.5 mg/dL — AB (ref 0.61–1.24)
Calcium, Ion: 1.25 mmol/L (ref 1.15–1.40)
Glucose, Bld: 133 mg/dL — ABNORMAL HIGH (ref 65–99)
HEMATOCRIT: 36 % — AB (ref 39.0–52.0)
HEMOGLOBIN: 12.2 g/dL — AB (ref 13.0–17.0)
POTASSIUM: 4.7 mmol/L (ref 3.5–5.1)
Sodium: 139 mmol/L (ref 135–145)
TCO2: 25 mmol/L (ref 22–32)

## 2017-08-14 LAB — GLUCOSE, CAPILLARY
GLUCOSE-CAPILLARY: 267 mg/dL — AB (ref 65–99)
Glucose-Capillary: 133 mg/dL — ABNORMAL HIGH (ref 65–99)
Glucose-Capillary: 194 mg/dL — ABNORMAL HIGH (ref 65–99)

## 2017-08-14 LAB — POCT ACTIVATED CLOTTING TIME
ACTIVATED CLOTTING TIME: 263 s
Activated Clotting Time: 246 seconds

## 2017-08-14 SURGERY — ABDOMINAL AORTOGRAM W/LOWER EXTREMITY
Anesthesia: LOCAL

## 2017-08-14 MED ORDER — HYDRALAZINE HCL 20 MG/ML IJ SOLN: 5.0000 mg | INTRAMUSCULAR | Status: DC | PRN

## 2017-08-14 MED ORDER — CLOPIDOGREL BISULFATE 300 MG PO TABS
ORAL_TABLET | ORAL | Status: AC
  Filled 2017-08-14: qty 1

## 2017-08-14 MED ORDER — HEPARIN SODIUM (PORCINE) 1000 UNIT/ML IJ SOLN
INTRAMUSCULAR | Status: AC
  Filled 2017-08-14: qty 1

## 2017-08-14 MED ORDER — ROSUVASTATIN CALCIUM 20 MG PO TABS
20.0000 mg | ORAL_TABLET | Freq: Every day | ORAL | Status: DC
  Administered 2017-08-14: 20 mg via ORAL
  Filled 2017-08-14: qty 1

## 2017-08-14 MED ORDER — FENTANYL CITRATE (PF) 100 MCG/2ML IJ SOLN
INTRAMUSCULAR | Status: AC
  Filled 2017-08-14: qty 2

## 2017-08-14 MED ORDER — HEPARIN (PORCINE) IN NACL 2-0.9 UNIT/ML-% IJ SOLN
INTRAMUSCULAR | Status: AC | PRN
  Administered 2017-08-14: 1000 mL via INTRA_ARTERIAL

## 2017-08-14 MED ORDER — LIDOCAINE HCL (PF) 1 % IJ SOLN
INTRAMUSCULAR | Status: DC | PRN
  Administered 2017-08-14: 15 mL

## 2017-08-14 MED ORDER — FENTANYL CITRATE (PF) 100 MCG/2ML IJ SOLN
INTRAMUSCULAR | Status: DC | PRN
  Administered 2017-08-14 (×2): 25 ug via INTRAVENOUS
  Administered 2017-08-14: 50 ug via INTRAVENOUS
  Administered 2017-08-14: 25 ug via INTRAVENOUS
  Administered 2017-08-14: 50 ug via INTRAVENOUS
  Administered 2017-08-14: 25 ug via INTRAVENOUS

## 2017-08-14 MED ORDER — ASPIRIN EC 81 MG PO TBEC: 81.0000 mg | DELAYED_RELEASE_TABLET | Freq: Every day | ORAL | Status: DC

## 2017-08-14 MED ORDER — LABETALOL HCL 5 MG/ML IV SOLN
10.0000 mg | INTRAVENOUS | Status: DC | PRN
  Administered 2017-08-14 (×3): 10 mg via INTRAVENOUS
  Filled 2017-08-14: qty 4

## 2017-08-14 MED ORDER — OXYCODONE HCL 5 MG PO TABS
5.0000 mg | ORAL_TABLET | ORAL | Status: DC | PRN
  Administered 2017-08-14 – 2017-08-15 (×2): 10 mg via ORAL
  Filled 2017-08-14 (×2): qty 2

## 2017-08-14 MED ORDER — MIDAZOLAM HCL 2 MG/2ML IJ SOLN
INTRAMUSCULAR | Status: AC
  Filled 2017-08-14: qty 2

## 2017-08-14 MED ORDER — CLOPIDOGREL BISULFATE 300 MG PO TABS
300.0000 mg | ORAL_TABLET | Freq: Once | ORAL | Status: AC
  Administered 2017-08-14: 300 mg via ORAL

## 2017-08-14 MED ORDER — ACETAMINOPHEN 325 MG PO TABS: 650.0000 mg | ORAL_TABLET | ORAL | Status: DC | PRN

## 2017-08-14 MED ORDER — SODIUM CHLORIDE 0.9 % IV SOLN: 250.0000 mL | INTRAVENOUS | Status: DC | PRN

## 2017-08-14 MED ORDER — HYDROCHLOROTHIAZIDE 25 MG PO TABS
25.0000 mg | ORAL_TABLET | Freq: Two times a day (BID) | ORAL | Status: DC
  Administered 2017-08-14 – 2017-08-15 (×2): 25 mg via ORAL
  Filled 2017-08-14 (×2): qty 1

## 2017-08-14 MED ORDER — LABETALOL HCL 5 MG/ML IV SOLN
INTRAVENOUS | Status: AC
  Filled 2017-08-14: qty 4

## 2017-08-14 MED ORDER — MORPHINE SULFATE (PF) 4 MG/ML IV SOLN: 2.0000 mg | Freq: Once | INTRAVENOUS | Status: DC

## 2017-08-14 MED ORDER — MORPHINE SULFATE (PF) 4 MG/ML IV SOLN
INTRAVENOUS | Status: AC
  Administered 2017-08-14: 2 mg
  Filled 2017-08-14: qty 1

## 2017-08-14 MED ORDER — SODIUM CHLORIDE 0.9 % IV SOLN
INTRAVENOUS | Status: DC
  Administered 2017-08-14: 09:00:00 via INTRAVENOUS

## 2017-08-14 MED ORDER — MORPHINE SULFATE (PF) 10 MG/ML IV SOLN: 2.0000 mg | INTRAVENOUS | Status: DC | PRN

## 2017-08-14 MED ORDER — LISINOPRIL-HYDROCHLOROTHIAZIDE 20-25 MG PO TABS: 1.0000 | ORAL_TABLET | Freq: Two times a day (BID) | ORAL | Status: DC

## 2017-08-14 MED ORDER — NITROGLYCERIN 0.4 MG SL SUBL: 0.4000 mg | SUBLINGUAL_TABLET | SUBLINGUAL | Status: DC | PRN

## 2017-08-14 MED ORDER — METOPROLOL TARTRATE 100 MG PO TABS
100.0000 mg | ORAL_TABLET | Freq: Two times a day (BID) | ORAL | Status: DC
  Administered 2017-08-14 – 2017-08-15 (×2): 100 mg via ORAL
  Filled 2017-08-14: qty 1
  Filled 2017-08-14: qty 4
  Filled 2017-08-14: qty 1
  Filled 2017-08-14: qty 4

## 2017-08-14 MED ORDER — HEPARIN SODIUM (PORCINE) 1000 UNIT/ML IJ SOLN
INTRAMUSCULAR | Status: DC | PRN
  Administered 2017-08-14: 2000 [IU] via INTRAVENOUS
  Administered 2017-08-14: 10000 [IU] via INTRAVENOUS

## 2017-08-14 MED ORDER — ASPIRIN EC 81 MG PO TBEC
81.0000 mg | DELAYED_RELEASE_TABLET | Freq: Every day | ORAL | Status: DC
  Administered 2017-08-14 – 2017-08-15 (×2): 81 mg via ORAL
  Filled 2017-08-14 (×2): qty 1

## 2017-08-14 MED ORDER — SODIUM CHLORIDE 0.9% FLUSH: 3.0000 mL | INTRAVENOUS | Status: DC | PRN

## 2017-08-14 MED ORDER — SODIUM CHLORIDE 0.9% FLUSH: 3.0000 mL | Freq: Two times a day (BID) | INTRAVENOUS | Status: DC

## 2017-08-14 MED ORDER — SULFAMETHOXAZOLE-TRIMETHOPRIM 400-80 MG PO TABS
1.0000 | ORAL_TABLET | Freq: Two times a day (BID) | ORAL | Status: DC
  Administered 2017-08-14 – 2017-08-15 (×2): 1 via ORAL
  Filled 2017-08-14 (×2): qty 1

## 2017-08-14 MED ORDER — LISINOPRIL 10 MG PO TABS
20.0000 mg | ORAL_TABLET | Freq: Two times a day (BID) | ORAL | Status: DC
  Administered 2017-08-14 – 2017-08-15 (×2): 20 mg via ORAL
  Filled 2017-08-14 (×2): qty 2

## 2017-08-14 MED ORDER — MIDAZOLAM HCL 2 MG/2ML IJ SOLN
INTRAMUSCULAR | Status: DC | PRN
  Administered 2017-08-14: 2 mg via INTRAVENOUS
  Administered 2017-08-14 (×3): 1 mg via INTRAVENOUS
  Administered 2017-08-14: 2 mg via INTRAVENOUS
  Administered 2017-08-14: 1 mg via INTRAVENOUS

## 2017-08-14 MED ORDER — GABAPENTIN 300 MG PO CAPS
300.0000 mg | ORAL_CAPSULE | Freq: Two times a day (BID) | ORAL | Status: DC
  Administered 2017-08-14 – 2017-08-15 (×2): 300 mg via ORAL
  Filled 2017-08-14 (×2): qty 1

## 2017-08-14 MED ORDER — INSULIN ASPART 100 UNIT/ML ~~LOC~~ SOLN
0.0000 [IU] | Freq: Three times a day (TID) | SUBCUTANEOUS | Status: DC
  Administered 2017-08-14: 19:00:00 8 [IU] via SUBCUTANEOUS
  Administered 2017-08-15: 07:00:00 2 [IU] via SUBCUTANEOUS

## 2017-08-14 MED ORDER — IODIXANOL 320 MG/ML IV SOLN
INTRAVENOUS | Status: DC | PRN
  Administered 2017-08-14: 140 mL via INTRA_ARTERIAL

## 2017-08-14 MED ORDER — ONDANSETRON HCL 4 MG/2ML IJ SOLN: 4.0000 mg | Freq: Four times a day (QID) | INTRAMUSCULAR | Status: DC | PRN

## 2017-08-14 MED ORDER — MORPHINE SULFATE (PF) 4 MG/ML IV SOLN
2.0000 mg | INTRAVENOUS | Status: DC | PRN
  Administered 2017-08-14: 2 mg via INTRAVENOUS
  Filled 2017-08-14: qty 1

## 2017-08-14 MED ORDER — SODIUM CHLORIDE 0.9 % IV SOLN: INTRAVENOUS | Status: AC

## 2017-08-14 MED ORDER — HEPARIN (PORCINE) IN NACL 2-0.9 UNIT/ML-% IJ SOLN
INTRAMUSCULAR | Status: AC
  Filled 2017-08-14: qty 500

## 2017-08-14 MED ORDER — CLOPIDOGREL BISULFATE 75 MG PO TABS
75.0000 mg | ORAL_TABLET | Freq: Every day | ORAL | Status: DC
  Administered 2017-08-15: 09:00:00 75 mg via ORAL
  Filled 2017-08-14: qty 1

## 2017-08-14 MED ORDER — LIDOCAINE HCL 1 % IJ SOLN
INTRAMUSCULAR | Status: AC
  Filled 2017-08-14: qty 20

## 2017-08-14 SURGICAL SUPPLY — 43 items
BALLN LUTONIX 5X150X130 (BALLOONS) ×3
BALLN STERLING OTW 3X100X150 (BALLOONS) ×3
BALLN STERLING OTW 5X150X150 (BALLOONS) ×3
BALLN STERLING OTW 6X60X135 (BALLOONS) ×3
BALLOON LUTONIX 5X150X130 (BALLOONS) IMPLANT
BALLOON STERLING OTW 3X100X150 (BALLOONS) IMPLANT
BALLOON STERLING OTW 5X150X150 (BALLOONS) IMPLANT
BALLOON STERLING OTW 6X60X135 (BALLOONS) IMPLANT
BUR JETSTREAM XC 2.1/3.0 (BURR) IMPLANT
BURR JETSTREAM XC 2.1/3.0 (BURR) ×3
CATH ANGIO 5F BER2 100CM (CATHETERS) ×1 IMPLANT
CATH OMNI FLUSH 5F 65CM (CATHETERS) ×1 IMPLANT
CATH QUICKCROSS SUPP .035X90CM (MICROCATHETER) ×1 IMPLANT
CATH SOFT-VU 4F 65 STRAIGHT (CATHETERS) IMPLANT
CATH SOFT-VU STRAIGHT 4F 65CM (CATHETERS) ×3
COVER PRB 48X5XTLSCP FOLD TPE (BAG) IMPLANT
COVER PROBE 5X48 (BAG) ×3
DEVICE CLOSURE MYNXGRIP 6/7F (Vascular Products) ×1 IMPLANT
DEVICE EMBOSHIELD NAV6 4.0-7.0 (WIRE) ×1 IMPLANT
DEVICE TORQUE H2O (MISCELLANEOUS) ×1 IMPLANT
DRAPE ZERO GRAVITY STERILE (DRAPES) ×1 IMPLANT
FILTER CO2 0.2 MICRON (VASCULAR PRODUCTS) ×1 IMPLANT
GUIDEWIRE ANGLED .035X150CM (WIRE) ×1 IMPLANT
KIT ENCORE 26 ADVANTAGE (KITS) ×1 IMPLANT
KIT MICROINTRODUCER STIFF 5F (SHEATH) ×1 IMPLANT
KIT PV (KITS) ×3 IMPLANT
LUBRICANT VIPERSLIDE CORONARY (MISCELLANEOUS) ×1 IMPLANT
RESERVOIR CO2 (VASCULAR PRODUCTS) ×1 IMPLANT
SET FLUSH CO2 (MISCELLANEOUS) ×1 IMPLANT
SHEATH PINNACLE 5F 10CM (SHEATH) ×1 IMPLANT
SHEATH PINNACLE 7F 10CM (SHEATH) ×1 IMPLANT
SHEATH PINNACLE MP 7F 45CM (SHEATH) ×1 IMPLANT
SHIELD RADPAD SCOOP 12X17 (MISCELLANEOUS) ×1 IMPLANT
STENT INNOVA 6X150X130 (Permanent Stent) ×1 IMPLANT
SYR MEDRAD MARK V 150ML (SYRINGE) ×3 IMPLANT
TAPE VIPERTRACK RADIOPAQ (MISCELLANEOUS) IMPLANT
TAPE VIPERTRACK RADIOPAQUE (MISCELLANEOUS) ×3
TRANSDUCER W/STOPCOCK (MISCELLANEOUS) ×3 IMPLANT
TRAY PV CATH (CUSTOM PROCEDURE TRAY) ×3 IMPLANT
WIRE BAREWIRE WORK .014X315CM (WIRE) ×1 IMPLANT
WIRE BENTSON .035X145CM (WIRE) ×1 IMPLANT
WIRE G V18X300CM (WIRE) ×2 IMPLANT
WIRE TORQFLEX AUST .018X40CM (WIRE) ×1 IMPLANT

## 2017-08-14 NOTE — Op Note (Signed)
Patient name: Joel Hebert MRN: [id number] DOB: 02/15/1956 Sex: male  08/14/2017 Pre-operative Diagnosis: bilateral leg ulcers Post-operative diagnosis:  Same Surgeon:  Durene CalWells Quint Chestnut Procedure Performed:  1.  Ultrasound-guided access, right femoral artery  2.  Abdominal aortogram with CO2  3.  Left lower extremity runoff  4.  Atherectomy with drug-coated balloon angioplasty, left superficial femoral artery  5.  Stent, left superficial femoral artery  6.  Angioplasty, left popliteal artery  7.  Conscious sedation (157 minutes)  8 failed angioplasty,.  Left posterior tibial artery  9.  Closure device (Mynx)   Indications: The patient suffers from diabetes and has bilateral lower extremity ulcers.  ABIs were documented to show poor circulation bilaterally.  He comes in today for further evaluation and possible intervention  Procedure:  The patient was identified in the holding area and taken to room 8.  The patient was then placed supine on the table and prepped and draped in the usual sterile fashion.  A time out was called.  Conscious sedation was administered with the use of IV fentanyl and Versed under continuous physician and nurse monitoring.  Heart rate, blood pressure, and oxygen saturations were continuously monitored.  Ultrasound was used to evaluate the right common femoral artery.  It was patent .  A digital ultrasound image was acquired.  A micropuncture needle was used to access the right common femoral artery under ultrasound guidance.  An 018 wire was advanced without resistance and a micropuncture sheath was placed.  The 018 wire was removed and a benson wire was placed.  The micropuncture sheath was exchanged for a 5 french sheath.  An omniflush catheter was advanced over the wire to the level of L-1.  An abdominal angiogram was obtained with CO2.  Next, using the omniflush catheter and a benson wire, the aortic bifurcation was crossed and the catheter was placed into  theleft external iliac artery and left runoff was obtained.    Findings:   Aortogram: No significant renal artery stenosis.  The infrarenal abdominal aorta is widely patent.  Bilateral common and external iliac arteries are widely patent.  Right Lower Extremity: Not evaluated due to contrast utilization for left leg intervention  Left Lower Extremity: The left common femoral and profunda femoral artery are widely patent.  There is a approximately a 10 cm occlusion of the left superficial femoral artery with reconstitution of the above-knee popliteal artery which is diffusely diseased with several areas of 80% stenosis.  There is occlusion of all 3 tibial vessels.  The peroneal artery reconstitutes 3 cm beyond its origin.  The posterior tibial reconstitutes and crosses the ankle.  Intervention: After the above images were acquired the decision was made to proceed with intervention.  A 7 French 45 cm sheath was advanced into the left external iliac artery.  The patient was fully heparinized.  Using a 035 Glidewire and a quick cross catheter of the occlusion was successfully crossed.  Crossing was confirmed by contrast injection through the quick cross catheter in the popliteal artery.  Next, a 014 bear wire was inserted followed by placement of a large NAV 6 filter.  I then used the jetstream device to perform atherectomy of a total occlusion for a length of approximately 14 cm.  The device was passed down and back with blades down.  Next this was treated with a 5 x 150 Sterling balloon taking the balloon to nominal pressure for 2 minutes.  This was followed by a  5 x 150 drug-coated Lutoni balloon which was inflated to nominal pressure forx 2 minutes.  Completion imaging was then performed which showed suboptimal result with continued occlusion.  I therefore elected to stent the treated area.  A 6 x 150 INNOVA stent was deployed and then molded with a 5 x 150 balloon.  There was residual stenosis in the  proximal portion of the stent which was further dilated using a 6 x 60 Sterling balloon.  Follow-up imaging revealed in-line flow through the superficial femoral and popliteal artery.  Next the filter was retrieved.  It was inspected and found to be intact with no debris.  I then used a V-18 wire supported by a 3 x 100 Sterling balloon.  I tried to perform some intimal recanalization of the occluded peroneal artery.  I was able to get a wire into the submental space but could not directed back into the artery.  After multiple failed attempts, I elected to abort this procedure as the patient was beginning to become very agitated.  I used is 3 x 100 balloon to perform balloon angioplasty of the popliteal artery.  The balloon was taken to nominal pressure for 2 minutes.  Completion imaging revealed successful angioplasty of the popliteal artery with residual stenosis less than 15%.  At this point catheters and wires were removed.  A long 7 French sheath was exchanged out for a short 7 [place] sheath and a device was used to occludeMynx the arteriotomy site.  The patient tolerated the procedure well.  There were no immediate complications.  A total of 140 cc of contrast were utilized. 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  This was treated with atherectomy and drug-coated balloon angioplasty with suboptimal result, requiring stenting using a 6 x 150 self-expanding stent  #2 occlusion of all 3 tibial vessels.  An attempt at recanalization was performed but unsuccessful.  #3 successful balloon angioplasty of the left popliteal artery.  Preintervention stenosis was 80%, post was less than 15%.  A 3 mm balloon was utilized  #4 if the patient is unable to heal his wound, a repeat attempt at crossing the tibial occlusion could be performed to the groin or possibly from pedal access.    Juleen China, M.D. [facility]  [place] Office: [phone] Pager:  [phone]

## 2017-08-14 NOTE — Interval H&P Note (Signed)
History and Physical Interval Note:  08/14/2017 11:48 AM  Joel Hebert  has presented today for surgery, with the diagnosis of pvd  The various methods of treatment have been discussed with the patient and family. After consideration of risks, benefits and other options for treatment, the patient has consented to  Procedure(s): ABDOMINAL AORTOGRAM W/LOWER EXTREMITY (N/A) as a surgical intervention .  The patient's history has been reviewed, patient examined, no change in status, stable for surgery.  I have reviewed the patient's chart and labs.  Questions were answered to the patient's satisfaction.     [name]

## 2017-08-14 NOTE — Plan of Care (Signed)
  Education: Understanding of CV disease, CV risk reduction, and recovery process will improve 08/14/2017 2256 - Progressing by Seleta RhymesAmo Kuffour, Alinda MoneyMelvin, RN   Activity: Ability to return to baseline activity level will improve 08/14/2017 2256 - Progressing by Cecile SheererAmo Kuffour, Chevy, RN   Cardiovascular: Vascular access site(s) Level 0-1 will be maintained 08/14/2017 2256 - Progressing by Cecile SheererAmo Kuffour, Bradon, RN   Health Behavior/Discharge Planning: Ability to safely manage health-related needs after discharge will improve 08/14/2017 2256 - Progressing by Cecile SheererAmo Kuffour, Jabarri, RN

## 2017-08-15 ENCOUNTER — Telehealth (HOSPITAL_COMMUNITY): Payer: Self-pay | Admitting: Physical Therapy

## 2017-08-15 ENCOUNTER — Ambulatory Visit (HOSPITAL_COMMUNITY): Payer: Medicaid Other | Attending: *Deleted | Admitting: Physical Therapy

## 2017-08-15 ENCOUNTER — Encounter (HOSPITAL_COMMUNITY): Payer: Self-pay | Admitting: Surgery

## 2017-08-15 DIAGNOSIS — S91201S Unspecified open wound of right great toe with damage to nail, sequela: Secondary | ICD-10-CM | POA: Insufficient documentation

## 2017-08-15 DIAGNOSIS — M79672 Pain in left foot: Secondary | ICD-10-CM | POA: Insufficient documentation

## 2017-08-15 DIAGNOSIS — S91102D Unspecified open wound of left great toe without damage to nail, subsequent encounter: Secondary | ICD-10-CM | POA: Insufficient documentation

## 2017-08-15 DIAGNOSIS — M79671 Pain in right foot: Secondary | ICD-10-CM | POA: Insufficient documentation

## 2017-08-15 DIAGNOSIS — E1151 Type 2 diabetes mellitus with diabetic peripheral angiopathy without gangrene: Secondary | ICD-10-CM | POA: Diagnosis not present

## 2017-08-15 DIAGNOSIS — S81802D Unspecified open wound, left lower leg, subsequent encounter: Secondary | ICD-10-CM | POA: Insufficient documentation

## 2017-08-15 LAB — CBC
HCT: 30.9 % — ABNORMAL LOW (ref 39.0–52.0)
HEMOGLOBIN: 10.1 g/dL — AB (ref 13.0–17.0)
MCH: 27.7 pg (ref 26.0–34.0)
MCHC: 32.7 g/dL (ref 30.0–36.0)
MCV: 84.9 fL (ref 78.0–100.0)
Platelets: 190 10*3/uL (ref 150–400)
RBC: 3.64 MIL/uL — AB (ref 4.22–5.81)
RDW: 13.3 % (ref 11.5–15.5)
WBC: 8.7 10*3/uL (ref 4.0–10.5)

## 2017-08-15 LAB — GLUCOSE, CAPILLARY
GLUCOSE-CAPILLARY: 147 mg/dL — AB (ref 65–99)
GLUCOSE-CAPILLARY: 93 mg/dL (ref 65–99)

## 2017-08-15 LAB — BASIC METABOLIC PANEL
ANION GAP: 9 (ref 5–15)
BUN: 22 mg/dL — ABNORMAL HIGH (ref 6–20)
CHLORIDE: 107 mmol/L (ref 101–111)
CO2: 20 mmol/L — ABNORMAL LOW (ref 22–32)
Calcium: 8.7 mg/dL — ABNORMAL LOW (ref 8.9–10.3)
Creatinine, Ser: 1.31 mg/dL — ABNORMAL HIGH (ref 0.61–1.24)
GFR calc non Af Amer: 56 mL/min — ABNORMAL LOW (ref >60)
Glucose, Bld: 202 mg/dL — ABNORMAL HIGH (ref 65–99)
Potassium: 4.5 mmol/L (ref 3.5–5.1)
SODIUM: 136 mmol/L (ref 135–145)

## 2017-08-15 MED ORDER — CLOPIDOGREL BISULFATE 75 MG PO TABS: 75.0000 mg | ORAL_TABLET | Freq: Every day | ORAL | 11 refills | Status: AC

## 2017-08-15 NOTE — Discharge Instructions (Signed)
° °  [facility]  Discharge Instructions  Lower Extremity Angiogram; Angioplasty/Stenting  Please refer to the following instructions for your post-procedure care. Your surgeon or physician assistant will discuss any changes with you.  Activity  Avoid lifting more than 8 pounds (1 gallons of milk) for 72 hours (3 days) after your procedure. You may walk as much as you can tolerate. It's OK to drive after 72 hours.  Bathing/Showering  You may shower the day after your procedure. If you have a bandage, you may remove it at 24- 48 hours. Clean your incision site with mild soap and water. Pat the area dry with a clean towel.  Diet  Resume your pre-procedure diet. There are no special food restrictions following this procedure. All patients with peripheral vascular disease should follow a low fat/low cholesterol diet. In order to heal from your surgery, it is CRITICAL to get adequate nutrition. Your body requires vitamins, minerals, and protein. Vegetables are the best source of vitamins and minerals. Vegetables also provide the perfect balance of protein. Processed food has little nutritional value, so try to avoid this.  Medications  Resume taking all of your medications unless your doctor tells you not to. If your incision is causing pain, you may take over-the-counter pain relievers such as acetaminophen (Tylenol).  Do not restart your Metformin until Saturday, 08/18/17.   Foot Wounds:   Keep dry and protected.  Inspect feet everyday to look for new or non healing wounds.    Follow Up  Follow up will be arranged at the time of your procedure. You may have an office visit scheduled or may be scheduled for surgery. Ask your surgeon if you have any questions.  Please call us immediately for any of the following conditions: Severe or worsening pain your legs or feet at rest or with walking. Increased pain, redness, drainage at your groin puncture  site. Fever of 101 degrees or higher. If you have any mild or slow bleeding from your puncture site: lie down, apply firm constant pressure over the area with a piece of gauze or a clean wash cloth for 30 minutes- no peeking!, call 911 right away if you are still bleeding after 30 minutes, or if the bleeding is heavy and unmanageable.  Reduce your risk factors of vascular disease:  Stop smoking. If you would like help call [place] at 1-800-QUIT-NOW ((862)265-64261-217-056-8145) or [place] at (432)497-8861463 775 8474. Manage your cholesterol Maintain a desired weight Control your diabetes Keep your blood pressure down  If you have any questions, please call the office at [phone]

## 2017-08-15 NOTE — Progress Notes (Signed)
Orthopedic Tech Progress Note Patient Details:  Bronson CurbMelvin Robert J Fitz 06/20/1956 [id number]  Ortho Devices Type of Ortho Device: Crutches Ortho Device/Splint Interventions: Application   Post Interventions Patient Tolerated: Well Instructions Provided: Care of device   Nikki DomCrawford, Tamela Elsayed 08/15/2017, 10:50 AM Viewed order from doctor's order list

## 2017-08-15 NOTE — Telephone Encounter (Signed)
Pt did not show for appt.  Called and [facility] for surgery completed yesterday.  States MD wants him to continue his woundcare and will resume next week.  Pt received stent in Lt femoral artery and balloon of Lt popliteal artery, however still has complete occlusal of all 3 tibial vessels.  Below is summary of vascular surgeons report, Dr. 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  This was treated with atherectomy and drug-coated balloon angioplasty with suboptimal result, requiring stenting using a 6 x 150 self-expanding stent             #2 occlusion of all 3 tibial vessels.  An attempt at recanalization was performed but unsuccessful.             #3 successful balloon angioplasty of the left popliteal artery.  Preintervention stenosis was 80%, post was less than 15%.  A 3 mm balloon was utilized             #4 if the patient is unable to heal his wound, a repeat attempt at crossing the tibial occlusion could be performed to the groin or possibly from pedal access.  Lurena NidaAmy B Frazier, PTA/CLT [phone]

## 2017-08-15 NOTE — Care Management Note (Addendum)
Case Management Note  Patient Details  Name: Joel Hebert MRN: [id number] Date of Birth: 09/09/1955  Subjective/Objective:  From home pta indep,  S/p pv intervention,will be on plavix,he has no PCP, no insurance.  N[facility]:30 am .  Ortho brought up crutches for patient to go home with. NCM will assist patient with medications thru Match Program.   Gave patient a Match Letter.               Action/Plan: DC home today.   Expected Discharge Date:                  Expected Discharge Plan:  Home/Self Care  In-House Referral:     Discharge planning Services  CM Consult, Medication Assistance, Follow-up appt scheduled[facility]  Post Acute Care Choice:  Durable Medical Equipment Choice offered to:     DME Arranged:  3-N-1, Crutches DME Agency:     HH Arranged:    HH Agency:     Status of Service:  Completed, signed off  If discussed at [employer] Length of Stay Meetings, dates discussed:    Additional Comments:  Joel Havenaylor, Joel Hebert [place], RN 08/15/2017, 10:44 AM

## 2017-08-15 NOTE — Discharge Summary (Signed)
Discharge Summary    Joel Hebert Mar 13, 1956 62 y.o. male  [id number]  Admission Date: 08/14/2017  Discharge Date: 08/15/17  Physician: Nada Libman, MD  Admission Diagnosis: PAD (peripheral artery disease) (HCC) [I73.9]   HPI:   This is a 62 y.o. male with a history of diabetes as well as heart disease recently noted a blister on his right great toe after wearing steel toed boots.  He now has similar ulceration on his left great and baby toes.  He has been taking gabapentin and this has not helped.  He also has associated swelling of his legs but denies any fevers or chills.  He does have a history of coronary stenting most recently in November for which she is taking Brilinta.  He has never had issues like this before.  He has not been started on antibiotics.  He does walk without limitation he says was having some leg pain prior to developing these ulcers.  Hospital Course:  The patient was admitted to the hospital and taken to the operating room on 08/14/2017 and underwent: Procedure Performed:             1.  Ultrasound-guided access, right femoral artery             2.  Abdominal aortogram with CO2             3.  Left lower extremity runoff             4.  Atherectomy with drug-coated balloon angioplasty, left superficial femoral artery             5.  Stent, left superficial femoral artery             6.  Angioplasty, left popliteal artery             7.  Conscious sedation (157 minutes)             8 failed angioplasty,.  Left posterior tibial artery             9.  Closure device (Mynx)    Findings:              Aortogram: No significant renal artery stenosis.  The infrarenal abdominal aorta is widely patent.  Bilateral common and external iliac arteries are widely patent.             Right Lower Extremity: Not evaluated due to contrast utilization for left leg intervention             Left Lower Extremity: The left common femoral and profunda femoral artery  are widely patent.  There is a approximately a 10 cm occlusion of the left superficial femoral artery with reconstitution of the above-knee popliteal artery which is diffusely diseased with several areas of 80% stenosis.  There is occlusion of all 3 tibial vessels.  The peroneal artery reconstitutes 3 cm beyond its origin.  The posterior tibial reconstitutes and crosses the ankle.   The pt tolerated the procedure well and was transported to the PACU in good condition.   By POD 1, he was doing well.  His right groin is soft without hematoma.  +right PT/peroneal doppler signal; brisk left PT and monophasic let AT/peroneal.  His creatinine is 1.3, which is down from yesterday at 1.5.  He is instructed to not restart his Metformin until Saturday, August 18, 2017.  He expressed understanding.   Left great toe 08/15/17  Left 5th toe 08/15/17  Right great toe 08/15/17  He was given an Rx for Plavix.    He will f/u with Dr. Randie Heinzain in one week.  Dr. Myra GianottiBrabham will se this up.  The remainder of the hospital course consisted of increasing mobilization and increasing intake of solids without difficulty.  CBC    Component Value Date/Time   WBC 8.7 08/15/2017 0343   RBC 3.64 (L) 08/15/2017 0343   HGB 10.1 (L) 08/15/2017 0343   HCT 30.9 (L) 08/15/2017 0343   PLT 190 08/15/2017 0343   MCV 84.9 08/15/2017 0343   MCH 27.7 08/15/2017 0343   MCHC 32.7 08/15/2017 0343   RDW 13.3 08/15/2017 0343   LYMPHSABS 2.8 08/05/2017 0308   MONOABS 0.4 08/05/2017 0308   EOSABS 0.4 08/05/2017 0308   BASOSABS 0.1 08/05/2017 0308    BMET    Component Value Date/Time   NA 136 08/15/2017 0343   K 4.5 08/15/2017 0343   CL 107 08/15/2017 0343   CO2 20 (L) 08/15/2017 0343   GLUCOSE 202 (H) 08/15/2017 0343   BUN 22 (H) 08/15/2017 0343   CREATININE 1.31 (H) 08/15/2017 0343   CALCIUM 8.7 (L) 08/15/2017 0343   GFRNONAA 56 (L) 08/15/2017 0343   GFRAA >60 08/15/2017 0343      Discharge Instructions    Nursing  communication   Complete by:  As directed    Please discuss with pt not to take Metformin until Saturday and he has a prescription for Pl[facility] to [facility].  Thanks      Discharge Diagnosis:  PAD (peripheral artery disease) (HCC) [I73.9]  Secondary Diagnosis: Patient Active Problem List   Diagnosis Date Noted  . PAD (peripheral artery disease) (HCC) 08/14/2017   Past Medical History:  Diagnosis Date  . Coronary artery disease   . High cholesterol   . Hypertension   . MI (myocardial infarction) (HCC) 08/13/2014; 01/2016- 03/2016 X 3  . PAD (peripheral artery disease) (HCC)   . Peripheral vascular disease (HCC)   . Type II diabetes mellitus (HCC)      Allergies as of 08/15/2017   No Known Allergies     Medication List    TAKE these medications   aspirin EC 81 MG tablet Take 81 mg by mouth daily.   clopidogrel 75 MG tablet Commonly known as:  PLAVIX Take 1 tablet (75 mg total) by mouth daily with breakfast.   diclofenac 50 MG EC tablet Commonly known as:  VOLTAREN Take 1 tablet (50 mg total) by mouth 2 (two) times daily.   gabapentin 300 MG capsule Commonly known as:  NEURONTIN Take 300 mg by mouth 2 (two) times daily.   HYDROcodone-acetaminophen 5-325 MG tablet Commonly known as:  NORCO Take 1-2 tablets by mouth every 6 (six) hours as needed.   lisinopril-hydrochlorothiazide 20-25 MG tablet Commonly known as:  PRINZIDE,ZESTORETIC Take 1 tablet by mouth 2 (two) times daily.   metFORMIN 850 MG tablet Commonly known as:  GLUCOPHAGE Take 850 mg by mouth 2 (two) times daily with a meal.   metoprolol tartrate 100 MG tablet Commonly known as:  LOPRESSOR Take 100 mg by mouth 2 (two) times daily.   nitroGLYCERIN 0.4 MG SL tablet Commonly known as:  NITROSTAT Place 0.4 mg under the tongue every 5 (five) minutes as needed for chest pain.   rosuvastatin 20 MG tablet Commonly known as:  CRESTOR Take 20 mg by mouth daily.     sulfamethoxazole-trimethoprim 400-80 MG tablet Commonly known as:  BACTRIM,SEPTRA Take 1 tablet by mouth 2 (two) times daily.            Durable Medical Equipment  (From admission, onward)        Start     Ordered   08/15/17 0819  For home use only DME Crutches  Once     08/15/17 0818      Prescriptions given: Plavix #30 [id number] Refills  Instructions: Discharge Instructions  Lower Extremity Angiogram; Angioplasty/Stenting  Please refer to the following instructions for your post-procedure care. Your surgeon or physician assistant will discuss any changes with you.  Activity  Avoid lifting more than 8 pounds (1 gallons of milk) for 72 hours (3 days) after your procedure. You may walk as much as you can tolerate. It's OK to drive after 72 hours.  Bathing/Showering  You may shower the day after your procedure. If you have a bandage, you may remove it at 24- 48 hours. Clean your incision site with mild soap and water. Pat the area dry with a clean towel.  Diet  Resume your pre-procedure diet. There are no special food restrictions following this procedure. All patients with peripheral vascular disease should follow a low fat/low cholesterol diet. In order to heal from your surgery, it is CRITICAL to get adequate nutrition. Your body requires vitamins, minerals, and protein. Vegetables are the best source of vitamins and minerals. Vegetables also provide the perfect balance of protein. Processed food has little nutritional value, so try to avoid this.  Medications  Resume taking all of your medications unless your doctor tells you not to. If your incision is causing pain, you may take over-the-counter pain relievers such as acetaminophen (Tylenol).   Do not restart your Metformin until Saturday, 08/18/17.   Foot Wounds:   Keep dry and protected.  Inspect feet everyday to look for new or non healing wounds.    Follow Up  Follow up will be arranged at the time of your  procedure. You may have an office visit scheduled or may be scheduled for surgery. Ask your surgeon if you have any questions.  Please call us immediately for any of the following conditions: .Severe or worsening pain your legs or feet at rest or with walking. .Increased pain, redness, drainage at your groin puncture site. .Fever of 101 degrees or higher. .If you have any mild or slow bleeding from your puncture site: lie down, apply firm constant pressure over the area with a piece of gauze or a clean wash cloth for 30 minutes- no peeking!, call 911 right away if you are still bleeding after 30 minutes, or if the bleeding is heavy and unmanageable.  Reduce your risk factors of vascular disease:  Stop smoking. If you would like help call [facility] at 1-800-QUIT-NOW (660-182-4117) or [place] at [phone]. Manage your cholesterol Maintain a desired weight Control your diabetes Keep your blood pressure down  If you have any questions, please call the office at [phone]  Disposition: home with crutches  Patient's condition: is Good  Follow up: 1. Dr. Randie Heinz in 1 week   Doreatha Massed, PA-C [facility] [phone] 08/15/2017  8:20 AM

## 2017-08-15 NOTE — Progress Notes (Signed)
Progress Note    08/15/2017 8:03 AM 1 Day Post-Op  Subjective:  Says his foot is feeling a little better today  Afebrile HR 70's-90's NSR 130's-170's systolic 98% RA  Vitals:   08/15/17 0400 08/15/17 0700  BP: (!) 136/56 (!) 150/61  Pulse: 80 78  Resp: 13 20  Temp:  98.7 F (37.1 C)  SpO2: 97% 100%    Physical Exam: Cardiac:  regular Lungs:  Non laboared Incisions:  Right groin is soft without hematoma Extremities:   +right PT/peroneal doppler signal; brisk left PT and monophasic let AT/peroneal  Left great toe 08/15/17  Left 5th toe 08/15/17  Right great toe 08/15/17   CBC    Component Value Date/Time   WBC 8.7 08/15/2017 0343   RBC 3.64 (L) 08/15/2017 0343   HGB 10.1 (L) 08/15/2017 0343   HCT 30.9 (L) 08/15/2017 0343   PLT 190 08/15/2017 0343   MCV 84.9 08/15/2017 0343   MCH 27.7 08/15/2017 0343   MCHC 32.7 08/15/2017 0343   RDW 13.3 08/15/2017 0343   LYMPHSABS 2.8 08/05/2017 0308   MONOABS 0.4 08/05/2017 0308   EOSABS 0.4 08/05/2017 0308   BASOSABS 0.1 08/05/2017 0308    BMET    Component Value Date/Time   NA 136 08/15/2017 0343   K 4.5 08/15/2017 0343   CL 107 08/15/2017 0343   CO2 20 (L) 08/15/2017 0343   GLUCOSE 202 (H) 08/15/2017 0343   BUN 22 (H) 08/15/2017 0343   CREATININE 1.31 (H) 08/15/2017 0343   CALCIUM 8.7 (L) 08/15/2017 0343   GFRNONAA 56 (L) 08/15/2017 0343   GFRAA >60 08/15/2017 0343    INR No results found for: INR   Intake/Output Summary (Last 24 hours) at 08/15/2017 0803 Last data filed at 08/15/2017 0700 Gross per 24 hour  Intake 413.33 ml  Output 725 ml  Net -311.67 ml     Assessment:  62 y.o. male is s/p:  Procedure Performed:             1.  Ultrasound-guided access, right femoral artery             2.  Abdominal aortogram with CO2             3.  Left lower extremity runoff             4.  Atherectomy with drug-coated balloon angioplasty, left superficial femoral artery             5.  Stent, left superficial  femoral artery             6.  Angioplasty, left popliteal artery             7.  Conscious sedation (157 minutes)             8 failed angioplasty,.  Left posterior tibial artery             9.  Closure device (Mynx)  1 Day Post-Op  Plan: -pt right groin is soft without hematoma - +right PT/peroneal doppler signal; brisk left PT and monophasic let AT/peroneal -discussed with pt to not restart his Metformin until Saturday 08/18/17 -discussed with pt about protecting his feet and inspecting them everyday given he is diabetic  -Rx for Plavix sent to [facility] [place] electronically -follow up for pt will set up by our office.  Will f/u with Dr. Randie Heinzain next week     Doreatha MassedSamantha Kyre Jeffries, PA-C [facility] [phone] 08/15/2017 8:03  AM    

## 2017-08-16 ENCOUNTER — Other Ambulatory Visit: Payer: Self-pay | Admitting: *Deleted

## 2017-08-16 MED FILL — Lidocaine HCl Local Inj 1%: INTRAMUSCULAR | Qty: 20 | Status: AC

## 2017-08-20 ENCOUNTER — Other Ambulatory Visit: Payer: Self-pay

## 2017-08-20 ENCOUNTER — Ambulatory Visit (HOSPITAL_COMMUNITY)
Admission: RE | Disposition: A | Discharge status: Home / Self Care | Payer: Self-pay | Source: Ambulatory Visit | Attending: Vascular Surgery

## 2017-08-20 ENCOUNTER — Ambulatory Visit (HOSPITAL_COMMUNITY)
Admission: RE | Admit: 2017-08-20 | Discharge: 2017-08-21 | Disposition: A | Discharge status: Home / Self Care | Payer: Medicaid Other | Source: Ambulatory Visit | Attending: Vascular Surgery | Admitting: Vascular Surgery

## 2017-08-20 DIAGNOSIS — I2510 Atherosclerotic heart disease of native coronary artery without angina pectoris: Secondary | ICD-10-CM | POA: Diagnosis not present

## 2017-08-20 DIAGNOSIS — Z7982 Long term (current) use of aspirin: Secondary | ICD-10-CM | POA: Diagnosis not present

## 2017-08-20 DIAGNOSIS — I96 Gangrene, not elsewhere classified: Secondary | ICD-10-CM

## 2017-08-20 DIAGNOSIS — Z7984 Long term (current) use of oral hypoglycemic drugs: Secondary | ICD-10-CM | POA: Insufficient documentation

## 2017-08-20 DIAGNOSIS — F17210 Nicotine dependence, cigarettes, uncomplicated: Secondary | ICD-10-CM | POA: Insufficient documentation

## 2017-08-20 DIAGNOSIS — I10 Essential (primary) hypertension: Secondary | ICD-10-CM | POA: Insufficient documentation

## 2017-08-20 DIAGNOSIS — I70261 Atherosclerosis of native arteries of extremities with gangrene, right leg: Secondary | ICD-10-CM | POA: Insufficient documentation

## 2017-08-20 DIAGNOSIS — Z955 Presence of coronary angioplasty implant and graft: Secondary | ICD-10-CM | POA: Insufficient documentation

## 2017-08-20 DIAGNOSIS — I252 Old myocardial infarction: Secondary | ICD-10-CM | POA: Insufficient documentation

## 2017-08-20 DIAGNOSIS — Z8249 Family history of ischemic heart disease and other diseases of the circulatory system: Secondary | ICD-10-CM | POA: Diagnosis not present

## 2017-08-20 DIAGNOSIS — E11621 Type 2 diabetes mellitus with foot ulcer: Secondary | ICD-10-CM | POA: Diagnosis not present

## 2017-08-20 DIAGNOSIS — E1152 Type 2 diabetes mellitus with diabetic peripheral angiopathy with gangrene: Secondary | ICD-10-CM | POA: Insufficient documentation

## 2017-08-20 DIAGNOSIS — I739 Peripheral vascular disease, unspecified: Secondary | ICD-10-CM | POA: Diagnosis present

## 2017-08-20 DIAGNOSIS — L97519 Non-pressure chronic ulcer of other part of right foot with unspecified severity: Secondary | ICD-10-CM | POA: Insufficient documentation

## 2017-08-20 HISTORY — PX: PERIPHERAL VASCULAR BALLOON ANGIOPLASTY: CATH118281

## 2017-08-20 HISTORY — PX: LOWER EXTREMITY ANGIOGRAPHY: CATH118251

## 2017-08-20 LAB — POCT I-STAT, CHEM 8
BUN: 19 mg/dL (ref 6–20)
CHLORIDE: 102 mmol/L (ref 101–111)
Calcium, Ion: 1.21 mmol/L (ref 1.15–1.40)
Creatinine, Ser: 1.3 mg/dL — ABNORMAL HIGH (ref 0.61–1.24)
GLUCOSE: 114 mg/dL — AB (ref 65–99)
HCT: 31 % — ABNORMAL LOW (ref 39.0–52.0)
Hemoglobin: 10.5 g/dL — ABNORMAL LOW (ref 13.0–17.0)
POTASSIUM: 4 mmol/L (ref 3.5–5.1)
SODIUM: 137 mmol/L (ref 135–145)
TCO2: 26 mmol/L (ref 22–32)

## 2017-08-20 LAB — POCT ACTIVATED CLOTTING TIME
ACTIVATED CLOTTING TIME: 230 s
ACTIVATED CLOTTING TIME: 252 s
ACTIVATED CLOTTING TIME: 191 s
ACTIVATED CLOTTING TIME: 213 s

## 2017-08-20 LAB — GLUCOSE, CAPILLARY: Glucose-Capillary: 122 mg/dL — ABNORMAL HIGH (ref 65–99)

## 2017-08-20 SURGERY — LOWER EXTREMITY ANGIOGRAPHY
Anesthesia: LOCAL

## 2017-08-20 MED ORDER — SODIUM CHLORIDE 0.9 % IV SOLN: INTRAVENOUS | Status: AC

## 2017-08-20 MED ORDER — ROSUVASTATIN CALCIUM 10 MG PO TABS
20.0000 mg | ORAL_TABLET | Freq: Every evening | ORAL | Status: DC
  Administered 2017-08-20: 20 mg via ORAL
  Filled 2017-08-20: qty 2

## 2017-08-20 MED ORDER — DICLOFENAC SODIUM 50 MG PO TBEC
50.0000 mg | DELAYED_RELEASE_TABLET | Freq: Two times a day (BID) | ORAL | Status: DC
  Administered 2017-08-20 – 2017-08-21 (×2): 50 mg via ORAL
  Filled 2017-08-20 (×2): qty 1

## 2017-08-20 MED ORDER — HYDROCHLOROTHIAZIDE 25 MG PO TABS
25.0000 mg | ORAL_TABLET | Freq: Two times a day (BID) | ORAL | Status: DC
  Administered 2017-08-20 – 2017-08-21 (×2): 25 mg via ORAL
  Filled 2017-08-20 (×2): qty 1

## 2017-08-20 MED ORDER — SULFAMETHOXAZOLE-TRIMETHOPRIM 400-80 MG PO TABS
1.0000 | ORAL_TABLET | Freq: Two times a day (BID) | ORAL | Status: DC
  Administered 2017-08-20 – 2017-08-21 (×2): 1 via ORAL
  Filled 2017-08-20 (×2): qty 1

## 2017-08-20 MED ORDER — OXYCODONE HCL 5 MG PO TABS
ORAL_TABLET | ORAL | Status: AC
  Filled 2017-08-20: qty 2

## 2017-08-20 MED ORDER — LIDOCAINE HCL 1 % IJ SOLN
INTRAMUSCULAR | Status: AC
  Filled 2017-08-20: qty 20

## 2017-08-20 MED ORDER — ACETAMINOPHEN 500 MG PO TABS: 1000.0000 mg | ORAL_TABLET | Freq: Four times a day (QID) | ORAL | Status: DC | PRN

## 2017-08-20 MED ORDER — SODIUM CHLORIDE 0.9 % IV SOLN
INTRAVENOUS | Status: DC
  Administered 2017-08-20: 14:00:00 via INTRAVENOUS

## 2017-08-20 MED ORDER — LISINOPRIL-HYDROCHLOROTHIAZIDE 20-25 MG PO TABS: 1.0000 | ORAL_TABLET | Freq: Two times a day (BID) | ORAL | Status: DC

## 2017-08-20 MED ORDER — NITROGLYCERIN 0.4 MG SL SUBL: 0.4000 mg | SUBLINGUAL_TABLET | SUBLINGUAL | Status: DC | PRN

## 2017-08-20 MED ORDER — HEPARIN SODIUM (PORCINE) 1000 UNIT/ML IJ SOLN
INTRAMUSCULAR | Status: DC | PRN
  Administered 2017-08-20: 10000 [IU] via INTRAVENOUS

## 2017-08-20 MED ORDER — MIDAZOLAM HCL 2 MG/2ML IJ SOLN
INTRAMUSCULAR | Status: AC
  Filled 2017-08-20: qty 2

## 2017-08-20 MED ORDER — MORPHINE SULFATE (PF) 10 MG/ML IV SOLN
2.0000 mg | INTRAVENOUS | Status: DC | PRN
  Administered 2017-08-20 (×2): 2 mg via INTRAVENOUS

## 2017-08-20 MED ORDER — SODIUM CHLORIDE 0.9% FLUSH: 3.0000 mL | INTRAVENOUS | Status: DC | PRN

## 2017-08-20 MED ORDER — LABETALOL HCL 5 MG/ML IV SOLN
INTRAVENOUS | Status: AC
  Filled 2017-08-20: qty 4

## 2017-08-20 MED ORDER — MIDAZOLAM HCL 2 MG/2ML IJ SOLN
INTRAMUSCULAR | Status: DC | PRN
  Administered 2017-08-20 (×2): 2 mg via INTRAVENOUS

## 2017-08-20 MED ORDER — HEPARIN (PORCINE) IN NACL 2-0.9 UNIT/ML-% IJ SOLN
INTRAMUSCULAR | Status: AC
  Filled 2017-08-20: qty 500

## 2017-08-20 MED ORDER — LISINOPRIL 10 MG PO TABS
20.0000 mg | ORAL_TABLET | Freq: Two times a day (BID) | ORAL | Status: DC
  Administered 2017-08-20 – 2017-08-21 (×2): 20 mg via ORAL
  Filled 2017-08-20 (×2): qty 2

## 2017-08-20 MED ORDER — LIDOCAINE HCL (PF) 1 % IJ SOLN
INTRAMUSCULAR | Status: DC | PRN
  Administered 2017-08-20: 6 mL
  Administered 2017-08-20: 15 mL

## 2017-08-20 MED ORDER — MORPHINE SULFATE (PF) 4 MG/ML IV SOLN
INTRAVENOUS | Status: AC
  Filled 2017-08-20: qty 1

## 2017-08-20 MED ORDER — SODIUM CHLORIDE 0.9 % IV SOLN: 250.0000 mL | INTRAVENOUS | Status: DC | PRN

## 2017-08-20 MED ORDER — MORPHINE SULFATE (PF) 2 MG/ML IV SOLN
2.0000 mg | INTRAVENOUS | Status: DC | PRN
  Administered 2017-08-20 – 2017-08-21 (×2): 2 mg via INTRAVENOUS
  Filled 2017-08-20 (×2): qty 1

## 2017-08-20 MED ORDER — METOPROLOL TARTRATE 100 MG PO TABS
100.0000 mg | ORAL_TABLET | Freq: Two times a day (BID) | ORAL | Status: DC
  Administered 2017-08-20 – 2017-08-21 (×2): 100 mg via ORAL
  Filled 2017-08-20 (×2): qty 1

## 2017-08-20 MED ORDER — GABAPENTIN 300 MG PO CAPS
300.0000 mg | ORAL_CAPSULE | Freq: Two times a day (BID) | ORAL | Status: DC
  Administered 2017-08-20 – 2017-08-21 (×2): 300 mg via ORAL
  Filled 2017-08-20 (×2): qty 1

## 2017-08-20 MED ORDER — HYDRALAZINE HCL 20 MG/ML IJ SOLN: 5.0000 mg | INTRAMUSCULAR | Status: DC | PRN

## 2017-08-20 MED ORDER — ASPIRIN EC 81 MG PO TBEC
81.0000 mg | DELAYED_RELEASE_TABLET | Freq: Every day | ORAL | Status: DC
  Administered 2017-08-21: 81 mg via ORAL
  Filled 2017-08-20: qty 1

## 2017-08-20 MED ORDER — FENTANYL CITRATE (PF) 100 MCG/2ML IJ SOLN
INTRAMUSCULAR | Status: AC
  Filled 2017-08-20: qty 2

## 2017-08-20 MED ORDER — CLOPIDOGREL BISULFATE 75 MG PO TABS
75.0000 mg | ORAL_TABLET | Freq: Every day | ORAL | Status: DC
  Administered 2017-08-21: 75 mg via ORAL
  Filled 2017-08-20: qty 1

## 2017-08-20 MED ORDER — OXYCODONE HCL 5 MG PO TABS
5.0000 mg | ORAL_TABLET | ORAL | Status: DC | PRN
  Administered 2017-08-20 – 2017-08-21 (×3): 10 mg via ORAL
  Filled 2017-08-20 (×3): qty 2

## 2017-08-20 MED ORDER — HEPARIN SODIUM (PORCINE) 1000 UNIT/ML IJ SOLN
INTRAMUSCULAR | Status: AC
  Filled 2017-08-20: qty 1

## 2017-08-20 MED ORDER — METFORMIN HCL 850 MG PO TABS
850.0000 mg | ORAL_TABLET | Freq: Two times a day (BID) | ORAL | Status: DC
  Administered 2017-08-21: 850 mg via ORAL
  Filled 2017-08-20 (×2): qty 1

## 2017-08-20 MED ORDER — LABETALOL HCL 5 MG/ML IV SOLN
10.0000 mg | INTRAVENOUS | Status: DC | PRN
  Administered 2017-08-20: 10 mg via INTRAVENOUS

## 2017-08-20 MED ORDER — IODIXANOL 320 MG/ML IV SOLN
INTRAVENOUS | Status: DC | PRN
  Administered 2017-08-20: 90 mL via INTRAVENOUS

## 2017-08-20 MED ORDER — SODIUM CHLORIDE 0.9% FLUSH: 3.0000 mL | Freq: Two times a day (BID) | INTRAVENOUS | Status: DC

## 2017-08-20 MED ORDER — FENTANYL CITRATE (PF) 100 MCG/2ML IJ SOLN
INTRAMUSCULAR | Status: DC | PRN
  Administered 2017-08-20 (×3): 50 ug via INTRAVENOUS

## 2017-08-20 SURGICAL SUPPLY — 25 items
BAG SNAP BAND KOVER 36X36 (MISCELLANEOUS) ×1 IMPLANT
BALLN STERLING OTW 3X60X150 (BALLOONS) ×3
BALLOON STERLING OTW 3X60X150 (BALLOONS) IMPLANT
CATH ANGIO 5F PIGTAIL 65CM (CATHETERS) IMPLANT
CATH CROSS OVER TEMPO 5F (CATHETERS) ×1 IMPLANT
CATH CXI SUPP ANG 2.6FR 150CM (MICROCATHETER) ×1 IMPLANT
CATH SOFT-VU 4F 65 STRAIGHT (CATHETERS) IMPLANT
CATH SOFT-VU STRAIGHT 4F 65CM (CATHETERS) ×3
CATH STRAIGHT 5FR 65CM (CATHETERS) ×1 IMPLANT
COVER DOME SNAP 22 D (MISCELLANEOUS) ×1 IMPLANT
DEVICE CONTINUOUS FLUSH (MISCELLANEOUS) ×1 IMPLANT
DEVICE TORQUE .025-.038 (MISCELLANEOUS) ×1 IMPLANT
GUIDEWIRE ANGLED .035X150CM (WIRE) ×1 IMPLANT
KIT ENCORE 26 ADVANTAGE (KITS) ×1 IMPLANT
KIT PV (KITS) ×3 IMPLANT
SHEATH PINNACLE 5F 10CM (SHEATH) ×1 IMPLANT
SHEATH PINNACLE ST 7F 45CM (SHEATH) ×1 IMPLANT
SHIELD RADPAD SCOOP 12X17 (MISCELLANEOUS) ×1 IMPLANT
SYR MEDRAD MARK V 150ML (SYRINGE) ×3 IMPLANT
TRANSDUCER W/STOPCOCK (MISCELLANEOUS) ×3 IMPLANT
TRAY PV CATH (CUSTOM PROCEDURE TRAY) ×3 IMPLANT
WIRE AMPLATZ SS-J .035X180CM (WIRE) ×1 IMPLANT
WIRE BENTSON .035X145CM (WIRE) ×1 IMPLANT
WIRE G V18X300CM (WIRE) ×1 IMPLANT
WIRE ROSEN-J .035X260CM (WIRE) ×1 IMPLANT

## 2017-08-20 NOTE — Progress Notes (Signed)
Site area: Left groin a 7 french sheath was removed  Site Prior to Removal:  Level 0  Pressure Applied For 35 MINUTES    Bedrest Beginning at 1915p  Manual:   Yes.    Patient Status During Pull:  stable  Post Pull Groin Site:  Level 0  Post Pull Instructions Given:  Yes.    Post Pull Pulses Present:  Yes.    Dressing Applied:  Yes.    Comments:  BP med and pain med effective

## 2017-08-20 NOTE — Op Note (Signed)
Procedure: Right lower extremity arteriogram and fourth order catheterization right tibioperoneal trunk, angioplasty right tibial peroneal trunk (3 x 60 balloon)  Preoperative diagnosis: Gangrene right first toe  Postoperative diagnosis: Same  Anesthesia: Local with IV sedation  Operative findings: Subtotal occlusion right tibioperoneal trunk  Two-vessel runoff peroneal posterior tibial artery  Operative details: After obtaining informed consent, the patient was taken the PV lab.  The patient was placed in supine position Angio table.  Both groins were prepped and draped in usual sterile fashion.  Local anesthesia was then treated of the left common femoral artery.  Ultrasound was used to identify the left common femoral artery and femoral bifurcation.  The left common femoral artery was cannulated under ultrasound guidance and Bentson wire threaded up in the abdominal aorta under fluoroscopic guidance.  Next a 5 French sheath was placed over the guidewire in the left common femoral artery.  This was thoroughly flushed with heparinized saline.  A 5 French crossover catheter was then used to selectively catheterize the right common iliac artery.  An 035 angled Glidewire was then advanced down into the distal right external iliac artery.  I attempted to initially put a 5 French straight catheter over this but the wire kept going up into the aorta and I could not get this to cross.  Therefore the crossover catheter was pulled back up in the operative field and an 035 Glidewire was advanced back down deep into the right superficial femoral artery and a 4 French straight catheter advanced over this into the mid superficial femoral artery and then the Glidewire exchanged for an [employer] wire.  With the [facility] wire in the right superficial femoral artery I was able to pull the 4 [place] straight catheter back the 5 French sheath was removed and a 7 [place] destination sheath advanced up and over the aortic  bifurcation all the way into the origin of the right superficial femoral artery.  Contrast angiogram was then performed of the entire right lower extremity.  The right common femoral profunda femoris and superficial femoral arteries are all widely patent.  The right popliteal artery is patent except for a 70% narrowing just past the knee joint.  The tibioperoneal trunk has a subtotal occlusion.  The posterior tibial artery is occluded over the first 3-4 cm and fills with collaterals from the popliteal artery as the dominant runoff vessel to the right foot.  The peroneal artery does fill but poorly.  At this point it was decided to intervene on the tibial peroneal trunk lesion.  The patient was given 10,000 and some intravenous heparin.  He was given additional 5000 units of heparin when the ACT was low initially.  ACT was confirmed to be above 250.  CXI catheter and a V 18 wire was then advanced down into the distal popliteal artery.  With some manipulation I was able to advance the V 18 wire into the tibial peroneal trunk and across the stenosis into the distal peroneal artery.  At this point a 3 x 60 angioplasty balloon was brought up in the operative field and advanced and centered on the lesion.  This was then inflated to nominal pressure for 1 minute.  Completion angiogram showed still some residual 20% stenosis of the tibioperoneal trunk and also a 50-60% stenosis of the popliteal artery.  However I did not feel that re-angioplasty was going to improve the result and possibly be at risk of dissection.  The popliteal artery stenosis also was in the  general vicinity of a large cluster of collaterals that were filling the posterior tibial artery so I did not angioplasty this in fear of disruption of this collateral system which is the primary filling of his posterior tibial artery which is the dominant vessel runoff vessel to his right foot.  At this point since he has in-line flow to the peroneal artery we will  see if the toe heals up.  If not he may be a candidate for further manipulation of his peroneal artery and tibioperoneal trunk or possibly a superficial femoral or popliteal to posterior tibial artery bypass.  At this point the guidewire was removed and the sheath pulled down in the left hemipelvis to be pulled in the holding area.  The sheath was thoroughly flushed with heparin I saline.  The patient tolerated the procedure well and there were no complications.  Operative management: The patient now has in-line flow through the peroneal artery to the right leg.  Hopefully this will be enough for wound healing of his right first toe.  He will follow-up with Dr. Randie Heinzain in 2-3 weeks to recheck wounds in both feet.  He will be maintained on Plavix and aspirin.  Fabienne Brunsharles Kristan Brummitt, MD [facility] [place] Office: [phone] Pager: [phone]

## 2017-08-20 NOTE — Progress Notes (Signed)
Received pt from cath lab. VSS. No c/o pain. L groin site level 0. Pt updated on plan of care. Call bell within reach. Will continue to monitor.  Margarito LinerStephanie M Raley Novicki, RN

## 2017-08-20 NOTE — Progress Notes (Signed)
Pt c/o 9/10 pain in [name].  Dr Randie Heinzain notified. He is ready for pt and will assess him in cath lab

## 2017-08-21 ENCOUNTER — Telehealth: Payer: Self-pay | Admitting: Vascular Surgery

## 2017-08-21 ENCOUNTER — Encounter (HOSPITAL_COMMUNITY): Payer: Self-pay | Admitting: Vascular Surgery

## 2017-08-21 DIAGNOSIS — E1152 Type 2 diabetes mellitus with diabetic peripheral angiopathy with gangrene: Secondary | ICD-10-CM | POA: Diagnosis not present

## 2017-08-21 LAB — CBC
HCT: 26.5 % — ABNORMAL LOW (ref 39.0–52.0)
Hemoglobin: 9.2 g/dL — ABNORMAL LOW (ref 13.0–17.0)
MCH: 29.4 pg (ref 26.0–34.0)
MCHC: 34.7 g/dL (ref 30.0–36.0)
MCV: 84.7 fL (ref 78.0–100.0)
Platelets: 228 10*3/uL (ref 150–400)
RBC: 3.13 MIL/uL — ABNORMAL LOW (ref 4.22–5.81)
RDW: 13 % (ref 11.5–15.5)
WBC: 7.3 10*3/uL (ref 4.0–10.5)

## 2017-08-21 LAB — BASIC METABOLIC PANEL
ANION GAP: 9 (ref 5–15)
BUN: 17 mg/dL (ref 6–20)
CALCIUM: 8.6 mg/dL — AB (ref 8.9–10.3)
CO2: 23 mmol/L (ref 22–32)
Chloride: 104 mmol/L (ref 101–111)
Creatinine, Ser: 1.35 mg/dL — ABNORMAL HIGH (ref 0.61–1.24)
GFR calc Af Amer: >60 mL/min (ref >60)
GFR calc non Af Amer: 54 mL/min — ABNORMAL LOW (ref >60)
GLUCOSE: 122 mg/dL — AB (ref 65–99)
Potassium: 4.5 mmol/L (ref 3.5–5.1)
Sodium: 136 mmol/L (ref 135–145)

## 2017-08-21 MED ORDER — OXYCODONE HCL 5 MG PO TABS: 5.0000 mg | ORAL_TABLET | Freq: Four times a day (QID) | ORAL | 0 refills | Status: DC | PRN

## 2017-08-21 MED FILL — Morphine Sulfate Inj 10 MG/ML: INTRAMUSCULAR | Qty: 0.5 | Status: AC

## 2017-08-21 MED FILL — Lidocaine HCl Local Inj 1%: INTRAMUSCULAR | Qty: 20 | Status: AC

## 2017-08-21 MED FILL — Heparin Sodium (Porcine) 2 Unit/ML in Sodium Chloride 0.9%: INTRAMUSCULAR | Qty: 1000 | Status: AC

## 2017-08-21 NOTE — Progress Notes (Signed)
BP: 115/59. Lisinopril 20mg  given. Pt states" Dr. Just started me on this med and my BP raises thoughout the day. Will continue to monitor.

## 2017-08-21 NOTE — Interval H&P Note (Signed)
History and Physical Interval Note:  08/21/2017 8:55 AM  Joel Hebert  has presented today for surgery, with the diagnosis of non healing ulcer  The various methods of treatment have been discussed with the patient and family. After consideration of risks, benefits and other options for treatment, the patient has consented to  Procedure(s): LOWER EXTREMITY ANGIOGRAPHY-rt leg (N/A) PERIPHERAL VASCULAR BALLOON ANGIOPLASTY as a surgical intervention .  The patient's history has been reviewed, patient examined, no change in status, stable for surgery.  I have reviewed the patient's chart and labs.  Questions were answered to the patient's satisfaction.     [name]

## 2017-08-21 NOTE — Discharge Summary (Signed)
Physician Discharge Summary   Patient ID: Joel Hebert [id number] 62 y.o. [birth year]  Admit date: 08/20/2017  Discharge date and time: 08/21/17   Admitting Physician: Sherren Kerns, MD   Discharge Physician: same  Admission Diagnoses: PAD (peripheral artery disease) [facility]) [I73.9]  Discharge Diagnoses: same  Admission Condition: fair  Discharged Condition: fair  Indication for Admission: gangrene R first toe  Hospital Course: Joel Hebert is a 62 year old male came in as an outpatient for right lower extremity arteriogram with balloon angioplasty of right tibioperoneal trunk due to gangrene of right first toe.  He is also status post left SFA stent by Dr. Myra Gianotti last week.  He tolerated procedure well and was admitted to the hospital overnight to monitor circulatory status as well as tissue changes of right foot.  [name] states right foot feels much better compared to preoperatively.  Morning drawn labs do not demonstrate any significant changes in renal function after contrast exposure.  He is in relating well however still complaining of aching pain in his left foot.  He will be prescribed #30 Norco 5/325 mg for continued postoperative pain control.  He will follow-up with Dr. Randie Heinz in office in about 2 weeks where we will reevaluate circulation and discuss further intervention and possibly toe amputations.  Discharge instructions were reviewed with the patient and he voices his understanding.  He will be discharged this afternoon in stable condition.  Consults: None and vascular surgery  Treatments: surgery: Right lower extremity arteriogram with balloon angioplasty right tibial peroneal trunk by Dr. Darrick Penna on 08/20/17.    Discharge Exam: See progress note 08/21/17 Vitals:   08/21/17 0259 08/21/17 0835  BP: 124/63 (!) 115/59  Pulse: 76   Resp: 12   Temp: 99.4 F (37.4 C)   SpO2: 98%      Disposition: 01-Home or Self Care  Patient Instructions:  Allergies as  of 08/21/2017   No Known Allergies     Medication List    TAKE these medications   acetaminophen 500 MG tablet Commonly known as:  TYLENOL Take 1,000 mg by mouth every 6 (six) hours as needed for mild pain.   aspirin EC 81 MG tablet Take 81 mg by mouth daily.   clopidogrel 75 MG tablet Commonly known as:  PLAVIX Take 1 tablet (75 mg total) by mouth daily with breakfast.   diclofenac 50 MG EC tablet Commonly known as:  VOLTAREN Take 1 tablet (50 mg total) by mouth 2 (two) times daily.   gabapentin 300 MG capsule Commonly known as:  NEURONTIN Take 300 mg by mouth 2 (two) times daily.   HYDROcodone-acetaminophen 5-325 MG tablet Commonly known as:  NORCO Take 1-2 tablets by mouth every 6 (six) hours as needed.   lisinopril-hydrochlorothiazide 20-25 MG tablet Commonly known as:  PRINZIDE,ZESTORETIC Take 1 tablet by mouth 2 (two) times daily.   metFORMIN 850 MG tablet Commonly known as:  GLUCOPHAGE Take 850 mg by mouth 2 (two) times daily with a meal.   metoprolol tartrate 100 MG tablet Commonly known as:  LOPRESSOR Take 100 mg by mouth 2 (two) times daily.   nitroGLYCERIN 0.4 MG SL tablet Commonly known as:  NITROSTAT Place 0.4 mg under the tongue every 5 (five) minutes as needed for chest pain.   oxyCODONE 5 MG immediate release tablet Commonly known as:  Oxy IR/ROXICODONE Take 1 tablet (5 mg total) by mouth every 6 (six) hours as needed for moderate pain.   rosuvastatin 20 MG tablet  Commonly known as:  CRESTOR Take 20 mg by mouth every evening.   sulfamethoxazole-trimethoprim 400-80 MG tablet Commonly known as:  BACTRIM,SEPTRA Take 1 tablet by mouth 2 (two) times daily.      Activity: activity as tolerated Diet: regular diet and diabetic diet Wound Care: none needed and as directed  Follow-up with Dr. Randie Heinzain in 2 weeks.  SignedEmilie Rutter: Joel Hebert 08/21/2017 12:08 PM

## 2017-08-21 NOTE — Telephone Encounter (Signed)
-----   Message from Sharee PimpleMarilyn K McChesney, RN sent at 08/20/2017  5:59 PM EST ----- Regarding: 2-3 weeks with Dr. Randie Heinzain   ----- Message ----- From: Sherren KernsFields, Charles E, MD Sent: 08/20/2017   3:59 PM To: [facility] Charge Pool  Right leg angio PTA of right tibioperoneal trunk 4 order cath  Needs follow up with Dr Randie Heinzain 2-3 weeks to check feet  Fabienne Brunsharles Fields

## 2017-08-21 NOTE — Progress Notes (Signed)
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  If he continues to have ongoing pain med needs may need to consider toe or left amputation left side  Joel Hebert 08/21/2017 10:37 AM --  Laboratory Lab Results: Recent Labs    08/20/17 1345 08/21/17 0323  WBC  --  7.3  HGB 10.5* 9.2*  HCT 31.0* 26.5*  PLT  --  228   BMET Recent Labs    08/20/17 1345 08/21/17 0323  NA 137 136  K 4.0 4.5  CL 102 104  CO2  --  23  GLUCOSE 114* 122*  BUN 19 17  CREATININE 1.30* 1.35*  CALCIUM  --  8.6*    COAG No results found for: INR, PROTIME No results found for: PTT

## 2017-08-21 NOTE — Telephone Encounter (Signed)
Sched appt 09/14/17 at 12:00. Lm on hm# to inform pt of appt.

## 2017-08-22 ENCOUNTER — Other Ambulatory Visit: Payer: Self-pay

## 2017-08-22 ENCOUNTER — Encounter (HOSPITAL_COMMUNITY): Payer: Self-pay | Admitting: Physical Therapy

## 2017-08-22 ENCOUNTER — Ambulatory Visit (HOSPITAL_COMMUNITY): Payer: Medicaid Other | Admitting: Physical Therapy

## 2017-08-22 DIAGNOSIS — S91201S Unspecified open wound of right great toe with damage to nail, sequela: Secondary | ICD-10-CM | POA: Diagnosis present

## 2017-08-22 DIAGNOSIS — M79671 Pain in right foot: Secondary | ICD-10-CM | POA: Diagnosis present

## 2017-08-22 DIAGNOSIS — M79672 Pain in left foot: Secondary | ICD-10-CM

## 2017-08-22 DIAGNOSIS — S91102D Unspecified open wound of left great toe without damage to nail, subsequent encounter: Secondary | ICD-10-CM

## 2017-08-22 DIAGNOSIS — S81802D Unspecified open wound, left lower leg, subsequent encounter: Secondary | ICD-10-CM | POA: Diagnosis present

## 2017-08-22 NOTE — Therapy (Signed)
[facility] [address] [place], [place], [address] Phone: [phone]   Fax:  [phone]  Wound Care Therapy  Patient Details  Name: Joel CurbMelvin Robert J Jia MRN: [id number] Date of Birth: 07/11/1955 Referring Provider: Kizzie Furnishochelle Muse    Encounter Date: 08/22/2017  PT End of Session - 08/22/17 1644    Visit Number  4    Number of Visits  12    Date for PT Re-Evaluation  09/21/17    Authorization Type  none    Authorization - Visit Number  4    Authorization - Number of Visits  12    PT Start Time  1345    PT Stop Time  1430    PT Time Calculation (min)  45 min    Activity Tolerance  Patient limited by pain    Behavior During Therapy  [facility]       Past Medical History:  Diagnosis Date  . Coronary artery disease   . High cholesterol   . Hypertension   . MI (myocardial infarction) (HCC) 08/13/2014; 01/2016- 03/2016 X 3  . PAD (peripheral artery disease) (HCC)   . Peripheral vascular disease (HCC)   . Type II diabetes mellitus (HCC)     Past Surgical History:  Procedure Laterality Date  . ABDOMINAL AORTOGRAM W/LOWER EXTREMITY  08/14/2017  . ABDOMINAL AORTOGRAM W/LOWER EXTREMITY N/A 08/14/2017   Procedure: ABDOMINAL AORTOGRAM W/LOWER EXTREMITY;  Surgeon: Nada LibmanBrabham, Vance W, MD;  [facility];  Service: Cardiovascular;  Laterality: N/A;  . CORONARY ANGIOPLASTY WITH STENT PLACEMENT  01/2016; 03/2016   "1 + 1" (08/14/2017)  . LOWER EXTREMITY ANGIOGRAPHY N/A 08/20/2017   Procedure: LOWER EXTREMITY ANGIOGRAPHY-rt leg;  Surgeon: Sherren KernsFields, Charles E, MD;  [facility];  Service: Cardiovascular;  Laterality: N/A;  . PERIPHERAL VASCULAR ATHERECTOMY  08/14/2017   Procedure: PERIPHERAL VASCULAR ATHERECTOMY;  Surgeon: Nada LibmanBrabham, Vance W, MD;  [facility]: [facility];  Service: Cardiovascular;;  SFA   . PERIPHERAL VASCULAR BALLOON ANGIOPLASTY  08/14/2017   Procedure: PERIPHERAL VASCULAR BALLOON  ANGIOPLASTY;  Surgeon: Nada LibmanBrabham, Vance W, MD;  [facility];  Service: Cardiovascular;;  peroneal  . PERIPHERAL VASCULAR BALLOON ANGIOPLASTY  08/20/2017   Procedure: PERIPHERAL VASCULAR BALLOON ANGIOPLASTY;  Surgeon: Sherren KernsFields, Charles E, MD;  [facility];  Service: Cardiovascular;;  . PERIPHERAL VASCULAR INTERVENTION  08/14/2017   Procedure: PERIPHERAL VASCULAR INTERVENTION;  Surgeon: Nada LibmanBrabham, Vance W, MD;  [facility];  Service: Cardiovascular;;  SFA stent  . TONSILLECTOMY      There were no vitals filed for this visit.              Wound Therapy - 08/22/17 1638    Subjective  PT states he hurt so bad over the weekend, he went to the ED and they completed studies, including ABI's of Bil LE's.  Has appt with vascular surgeon this Friday.    Patient and Family Stated Goals  decreased pain and wounds to heal.     Date of Onset  07/12/17    Prior Treatments  self care of soaking in bleach water.     Pain Assessment  0-10    Pain Score  9     Pain Location  Foot    Pain Orientation  Right;Left    Pain Descriptors / Indicators  Aching;Burning    Pain Onset  On-going    Patients Stated Pain Goal  0    Pain Intervention(s)  Emotional support    Evaluation and Treatment Procedures Explained to Patient/Family  Yes    Evaluation and Treatment Procedures  agreed to    Wound Properties Date First Assessed: 07/26/17 Time First Assessed: 0959 Wound Type: Other (Comment) Location: Toe (Comment  which one) Location Orientation: Right Wound Description (Comments): superior aspect of toe black 1.2 x 1 cm  Present on Admission: Yes   Dressing Type  Gauze (Comment)    Dressing Status  Clean;Dry;Intact    Dressing Change Frequency  PRN    Site / Wound Assessment  Dry;Black    % Wound base Red or Granulating  0%    % Wound base Black/Eschar  100%    Wound Length (cm)  1.2 cm    Wound Width (cm)  1 cm    Wound Surface Area (cm^2)  1.2 cm^2     Drainage Amount  None    Treatment  Cleansed;Debridement (Selective)    Wound Properties Date First Assessed: 08/22/17 Wound Type: Other (Comment) Location: Leg Location Orientation: Left Wound Description (Comments): two wounds on Lt shin superior 1.5x 1.2 ; inferior .25 x 1  Present on Admission: Yes   Dressing Type  None    % Wound base Red or Granulating  0%    % Wound base Yellow/Fibrinous Exudate  100%    Peri-wound Assessment  Edema    Wound Length (cm)  -- see above     Drainage Amount  Scant    Non-staged Wound Description  Full thickness    Treatment  Cleansed;Debridement (Selective)    Wound Properties Date First Assessed: 08/22/17 Time First Assessed: 1631 Wound Type: Other (Comment) Location: Foot Location Orientation: Left Wound Description (Comments): two dorsal foot wounds  Present on Admission: Yes   Dressing Type  None    Site / Wound Assessment  Brown;Yellow    % Wound base Red or Granulating  0%    % Wound base Yellow/Fibrinous Exudate  100%    Wound Length (cm)  -- .5 cm diameter B     Drainage Amount  None    Treatment  Cleansed;Debridement (Selective)    Wound Properties Date First Assessed: 08/22/17 Time First Assessed: 1454 Wound Type: Other (Comment) Location: Toe (Comment  which one) Location Orientation: Left Wound Description (Comments): Along nail bed 2x 1  Present on Admission: Yes   Dressing Type  None    Dressing Change Frequency  PRN    Site / Wound Assessment  Painful;Black    % Wound base Red or Granulating  0%    % Wound base Black/Eschar  100%    Wound Length (cm)  2 cm    Wound Width (cm)  1 cm    Wound Surface Area (cm^2)  2 cm^2    Drainage Amount  None    Treatment  Cleansed    Wound Properties Date First Assessed: 08/22/17 Wound Type: Other (Comment) Location: Toe (Comment  which one) Location Orientation: Left Wound Description (Comments): little toe    Dressing Type  None    Dressing Changed  New    % Wound base Red or Granulating  0%     % Wound base Black/Eschar  100%    Wound Length (cm)  1 cm    Wound Width (cm)  0.7 cm    Wound Surface Area (cm^2)  0.7 cm^2    Drainage Amount  None    Treatment  Cleansed  Selective Debridement - Location  Rt great toe wound     Selective Debridement - Tools Used  Forceps    Selective Debridement - Tissue Removed  devitalized tissue     Wound Therapy - Clinical Statement  PT has had surgery  to increase blood flow.  Three stents in the Lt and a balloonplasty in the right. Pt continues to have black eschar on both toes.  He also has two wounds located on his left shin and two wounds on his LT forefoot as well as a wound located on is left little toe.  All areas cleansed followed by debridement if needed and dressing with medihoney, 2x2 and tape.     Wound Therapy - Functional Problem List  pain with walking     Factors Delaying/Impairing Wound Healing  Altered sensation;Diabetes Mellitus;Vascular compromise    Hydrotherapy Plan  Debridement;Dressing change;Patient/family education    Wound Therapy - Frequency  -- 1x/week    Wound Therapy - Current Recommendations  Surgery consult    Wound Therapy - Follow Up Recommendations  Other (comment)    Wound Plan  Continue to provide dressings/woundcare as appropriate.  Await further instructions; results from Vascular appt.      Dressing   medihoney 2x2 and medipore tape.                 PT Short Term Goals - 08/22/17 1647      PT SHORT TERM GOAL #1   Title  Pt to have seen a cardiologist re ABI bilaterally     Time  2    Period  Weeks    Status  Achieved      PT SHORT TERM GOAL #2   Title  Wound  on Rt toe to only have 50% black escar    Time  3    Period  Weeks    Status  On-going        PT Long Term Goals - 08/22/17 1647      PT LONG TERM GOAL #1   Title  Pt wound on Rt great toe to be healed     Time  12    Period  Weeks    Status  On-going    Target Date  10/10/17      PT LONG TERM GOAL #2   Title  Blisters  on Lt great toe and little toe to be healed     Time  12    Period  Weeks    Status  On-going      PT LONG TERM GOAL #3   Title  Wounds on Lt shin and foot to be healed     Time  8    Period  Weeks    Status  New      PT LONG TERM GOAL #4   Title  wound on Lt great toe to be healed     Time  8    Period  Weeks    Status  New            Plan - 08/22/17 1645    Clinical Impression Statement  see above     Rehab Potential  Fair    PT Frequency  1x / week    PT Duration  12 weeks 8 more weeks     PT Treatment/Interventions  ADLs/Self Care Home Management debridement as needed dressing change     PT Next Visit Plan  Continue to be careful with debridement  but blood flow to feet should be improved since procedures.  Debride necrotic tissue but if area is not open leave alone.     Consulted and Agree with Plan of Care  Patient       Patient will benefit from skilled therapeutic intervention in order to improve the following deficits and impairments:  Pain, Decreased skin integrity  Visit Diagnosis: Pain in left foot  Pain in right foot  Open wound of right great toe with damage to nail, sequela  Open wound of left great toe, subsequent encounter  Wound of left leg, subsequent encounter     Problem List Patient Active Problem List   Diagnosis Date Noted  . PAD (peripheral artery disease) (HCC) 08/14/2017   Virgina Organ, PT CLT [phone] 08/22/2017, 4:50 PM  [facility] [address] [place], [place], [address] Phone: [phone]   Fax:  [phone]  Name: Syrus Nakama MRN: [id number] Date of Birth: 1955/11/08

## 2017-08-29 ENCOUNTER — Encounter: Payer: No Typology Code available for payment source | Admitting: Vascular Surgery

## 2017-08-29 ENCOUNTER — Ambulatory Visit (HOSPITAL_COMMUNITY): Payer: Medicaid Other | Admitting: Physical Therapy

## 2017-08-29 ENCOUNTER — Encounter (HOSPITAL_COMMUNITY): Payer: No Typology Code available for payment source

## 2017-08-29 DIAGNOSIS — S81802D Unspecified open wound, left lower leg, subsequent encounter: Secondary | ICD-10-CM

## 2017-08-29 DIAGNOSIS — M79671 Pain in right foot: Secondary | ICD-10-CM

## 2017-08-29 DIAGNOSIS — M79672 Pain in left foot: Secondary | ICD-10-CM | POA: Diagnosis not present

## 2017-08-29 DIAGNOSIS — S91102D Unspecified open wound of left great toe without damage to nail, subsequent encounter: Secondary | ICD-10-CM

## 2017-08-29 DIAGNOSIS — S91201S Unspecified open wound of right great toe with damage to nail, sequela: Secondary | ICD-10-CM

## 2017-08-29 NOTE — Therapy (Signed)
[place] [address] [place], [place], [address] Phone: [phone]   Fax:  [phone]  Wound Care Therapy  Patient Details  Name: Joel Hebert MRN: [id number] Date of Birth: 1955-10-28 Referring Provider: Kizzie Furnish    Encounter Date: 08/29/2017  PT End of Session - 08/29/17 1701    Visit Number  5    Number of Visits  12    Date for PT Re-Evaluation  09/21/17    Authorization Type  none    Authorization - Visit Number  5    Authorization - Number of Visits  12    PT Start Time  1435    PT Stop Time  1505    PT Time Calculation (min)  30 min    Activity Tolerance  Patient limited by pain    Behavior During Therapy  [facility] for tasks assessed/performed       Past Medical History:  Diagnosis Date  . Coronary artery disease   . High cholesterol   . Hypertension   . MI (myocardial infarction) (HCC) 08/13/2014; 01/2016- 03/2016 X 3  . PAD (peripheral artery disease) (HCC)   . Peripheral vascular disease (HCC)   . Type II diabetes mellitus (HCC)     Past Surgical History:  Procedure Laterality Date  . ABDOMINAL AORTOGRAM W/LOWER EXTREMITY  08/14/2017  . ABDOMINAL AORTOGRAM W/LOWER EXTREMITY N/A 08/14/2017   Procedure: ABDOMINAL AORTOGRAM W/LOWER EXTREMITY;  Surgeon: Nada Libman, MD;  [facility];  Service: Cardiovascular;  Laterality: N/A;  . CORONARY ANGIOPLASTY WITH STENT PLACEMENT  01/2016; 03/2016   "1 + 1" (08/14/2017)  . LOWER EXTREMITY ANGIOGRAPHY N/A 08/20/2017   Procedure: LOWER EXTREMITY ANGIOGRAPHY-rt leg;  Surgeon: Sherren Kerns, MD;  Location: [facility] INVASIVE CV LAB;  Service: Cardiovascular;  Laterality: N/A;  . PERIPHERAL VASCULAR ATHERECTOMY  08/14/2017   Procedure: PERIPHERAL VASCULAR ATHERECTOMY;  Surgeon: Nada Libman, MD;  [facility];  Service: Cardiovascular;;  SFA   . PERIPHERAL VASCULAR BALLOON ANGIOPLASTY  08/14/2017   Procedure: PERIPHERAL VASCULAR BALLOON  ANGIOPLASTY;  Surgeon: Nada Libman, MD;  [facility];  Service: Cardiovascular;;  peroneal  . PERIPHERAL VASCULAR BALLOON ANGIOPLASTY  08/20/2017   Procedure: PERIPHERAL VASCULAR BALLOON ANGIOPLASTY;  Surgeon: Sherren Kerns, MD;  [facility];  Service: Cardiovascular;;  . PERIPHERAL VASCULAR INTERVENTION  08/14/2017   Procedure: PERIPHERAL VASCULAR INTERVENTION;  Surgeon: Nada Libman, MD;  [facility];  Service: Cardiovascular;;  SFA stent  . TONSILLECTOMY      There were no vitals filed for this visit.              Wound Therapy - 08/29/17 1522    Subjective  Pt states the pain continues to be unbearable. Lt>Rt.  Pain is 9 with elevation, 5/10 in dependent position.  Lt foot is swollen and is concerned it is worsening.  Pt plans to return to MD tomorrow.     Patient and Family Stated Goals  decreased pain and wounds to heal.     Date of Onset  07/12/17    Prior Treatments  self care of soaking in bleach water.     Pain Assessment  0-10    Pain Score  9     Pain Type  Other (Comment)    Pain Location  Foot    Pain Orientation  Left;Right    Pain Descriptors / Indicators  Aching;Pins and needles;Shooting    Pain Onset  On-going    Patients Stated Pain Goal  0    Pain Intervention(s)  Emotional support;Other (Comment) hang foot down    Evaluation and Treatment Procedures Explained to Patient/Family  Yes    Evaluation and Treatment Procedures  agreed to    Wound Properties Date First Assessed: 07/26/17 Time First Assessed: 0959 Wound Type: Other (Comment) Location: Toe (Comment  which one) Location Orientation: Right Wound Description (Comments): superior aspect of toe black 1.2 x 1 cm  Present on Admission: Yes   Dressing Type  Gauze (Comment)    Dressing Changed  Changed    Dressing Status  Clean;Dry;Intact    Dressing Change Frequency  PRN    Site / Wound Assessment  Dry;Black    % Wound base Red or Granulating   0%    % Wound base Black/Eschar  100%    Wound Length (cm)  1.5 cm was 1.2    Wound Width (cm)  2 cm was 1 cm    Wound Depth (cm)  -- unknown, too painful to debride    Wound Surface Area (cm^2)  3 cm^2    Drainage Amount  None    Treatment  Cleansed    Wound Properties Date First Assessed: 08/22/17 Time First Assessed: 1631 Wound Type: Other (Comment) Location: Foot Location Orientation: Left Wound Description (Comments): two dorsal foot wounds  Present on Admission: Yes   Dressing Type  None    Dressing Changed  New    Dressing Status  None    Dressing Change Frequency  PRN    Site / Wound Assessment  Brown;Yellow    % Wound base Red or Granulating  10%    % Wound base Yellow/Fibrinous Exudate  90%    Peri-wound Assessment  Edema    Wound Length (cm)  -- superior wound 0.5cm2 (was 0.5cm2)    Wound Width (cm)  -- inferior wound 0.3cm2    Drainage Amount  None    Treatment  Cleansed;Debridement (Selective)    Wound Properties Date First Assessed: 08/22/17 Time First Assessed: 1454 Wound Type: Other (Comment) Location: Toe (Comment  which one) Location Orientation: Left Wound Description (Comments): Along nail bed 2x 1  Present on Admission: Yes   Dressing Type  Gauze (Comment);Tape dressing    Dressing Changed  Changed    Dressing Status  Clean;Dry;Intact    Dressing Change Frequency  PRN    Site / Wound Assessment  Painful;Black    % Wound base Red or Granulating  0%    % Wound base Black/Eschar  100%    Wound Length (cm)  2.5 cm was 2 cm    Wound Width (cm)  1.5 cm was 1 cm    Wound Depth (cm)  -- unknown, too painful to debride    Wound Surface Area (cm^2)  3.75 cm^2    Drainage Amount  None    Treatment  Cleansed    Wound Properties Date First Assessed: 08/22/17 Wound Type: Other (Comment) Location: Leg Location Orientation: Left Wound Description (Comments): two wounds on Lt shin superior 1.5x 1.2 ; inferior .25 x 1  Present on Admission: Yes   Dressing Type  None     Dressing Changed  New    Dressing Status  None    Dressing Change Frequency  PRN    Site / Wound Assessment  Dry;Brown;Black;Painful    % Wound base Red or Granulating  45%    %  Wound base Yellow/Fibrinous Exudate  55%    Peri-wound Assessment  Edema    Wound Length (cm)  -- superior wound 2 X 1.5 X 0 (was 1.5 X 1.2 X 0)    Wound Width (cm)  -- inferior wound:  0.8 X 1.5 X 0 (was 0.25 X 1 X 0)    Drainage Amount  Scant    Drainage Description  Serous    Non-staged Wound Description  --    Treatment  Cleansed;Debridement (Selective)    Wound Properties Date First Assessed: 08/22/17 Wound Type: Other (Comment) Location: Toe (Comment  which one) Location Orientation: Left Wound Description (Comments): little toe    Dressing Type  None    Dressing Changed  New    Dressing Status  None    Dressing Change Frequency  Every 5 days    Site / Wound Assessment  Dry;Painful;Pale    % Wound base Red or Granulating  0%    % Wound base Black/Eschar  100%    Wound Length (cm)  2 cm was 1 cm    Wound Width (cm)  1.5 cm was 0.7 cm    Wound Depth (cm)  -- unknown, too painful to debride    Wound Surface Area (cm^2)  3 cm^2    Drainage Amount  None    Treatment  Cleansed    Selective Debridement - Location  shin and dorsal aspect of Lt LE only.    Selective Debridement - Tools Used  Forceps    Selective Debridement - Tissue Removed  devitalized tissue     Wound Therapy - Clinical Statement  Pt would not allow debridement on toes as too painful.  All wounds remeasured with noted increase in size of all wounds.  This is concerning.  Encouarged pt to return to MD.  We are unable to provide skilled treatment as limited debridement allowed due to pain.  Pt unalble to elevate LE's long due to increasing pain.  Instructed pt to leave all dressings in place (stated he had been removing some and cleaning and applying neosporin).  pt verbalized understanding.  Dressed all wounds with medihoney gel and gauze.       Wound Therapy - Functional Problem List  pain with walking     Factors Delaying/Impairing Wound Healing  Altered sensation;Diabetes Mellitus;Vascular compromise    Hydrotherapy Plan  Debridement;Dressing change;Patient/family education    Wound Therapy - Frequency  -- 1x/week    Wound Therapy - Current Recommendations  Surgery consult    Wound Therapy - Follow Up Recommendations  Other (comment)    Wound Plan  Encouraged to return to MD.  All wounds are bigger and remain painful.  Tips of toes are blanched and cold.     Dressing   medihoney 2x2 and medipore tape.                 PT Short Term Goals - 08/22/17 1647      PT SHORT TERM GOAL #1   Title  Pt to have seen a cardiologist re ABI bilaterally     Time  2    Period  Weeks    Status  Achieved      PT SHORT TERM GOAL #2   Title  Wound  on Rt toe to only have 50% black escar    Time  3    Period  Weeks    Status  On-going        PT Long Term Goals -  08/22/17 1647      PT LONG TERM GOAL #1   Title  Pt wound on Rt great toe to be healed     Time  12    Period  Weeks    Status  On-going    Target Date  10/10/17      PT LONG TERM GOAL #2   Title  Blisters on Lt great toe and little toe to be healed     Time  12    Period  Weeks    Status  On-going      PT LONG TERM GOAL #3   Title  Wounds on Lt shin and foot to be healed     Time  8    Period  Weeks    Status  New      PT LONG TERM GOAL #4   Title  wound on Lt great toe to be healed     Time  8    Period  Weeks    Status  New              Patient will benefit from skilled therapeutic intervention in order to improve the following deficits and impairments:     Visit Diagnosis: Pain in left foot  Pain in right foot  Open wound of right great toe with damage to nail, sequela  Open wound of left great toe, subsequent encounter  Wound of left leg, subsequent encounter     Problem List Patient Active Problem List   Diagnosis Date  Noted  . PAD (peripheral artery disease) (HCC) 08/14/2017   Lurena NidaAmy B Denisha Hoel, PTA/CLT [phone]  Lurena NidaFrazier, Keziah Avis B 08/29/2017, 5:07 PM  [facility] [address] [place], [place], [address] Phone: [phone]   Fax:  [phone]  Name: Bronson CurbMelvin Robert J Cheeks MRN: [id number] Date of Birth: 10/12/1955

## 2017-09-03 ENCOUNTER — Other Ambulatory Visit: Payer: Self-pay

## 2017-09-03 ENCOUNTER — Encounter: Payer: Self-pay | Admitting: *Deleted

## 2017-09-03 ENCOUNTER — Ambulatory Visit (INDEPENDENT_AMBULATORY_CARE_PROVIDER_SITE_OTHER): Payer: Self-pay | Admitting: Family

## 2017-09-03 ENCOUNTER — Encounter: Payer: Self-pay | Admitting: Family

## 2017-09-03 ENCOUNTER — Other Ambulatory Visit: Payer: Self-pay | Admitting: *Deleted

## 2017-09-03 VITALS — BP 150/76 | HR 94 | Temp 97.6°F | Resp 16 | Wt 208.1 lb

## 2017-09-03 DIAGNOSIS — F17200 Nicotine dependence, unspecified, uncomplicated: Secondary | ICD-10-CM

## 2017-09-03 DIAGNOSIS — I739 Peripheral vascular disease, unspecified: Secondary | ICD-10-CM

## 2017-09-03 DIAGNOSIS — I999 Unspecified disorder of circulatory system: Secondary | ICD-10-CM

## 2017-09-03 DIAGNOSIS — M79672 Pain in left foot: Secondary | ICD-10-CM

## 2017-09-03 MED ORDER — OXYCODONE-ACETAMINOPHEN 5-325 MG PO TABS: 1.0000 | ORAL_TABLET | Freq: Four times a day (QID) | ORAL | 0 refills | Status: DC | PRN

## 2017-09-03 NOTE — Progress Notes (Signed)
Postoperative Visit   History of Present Illness  Joel Hebert is a 62 y.o. male who is s/p angioplasty right tibial peroneal trunk (3 x 60 balloon) on 08-20-17 by Dr. Darrick Penna for  gangrene of right first toe; subtotal occlusion right tibioperoneal trunk was found on angiogram.  He is also s/p atherectomy with drug-coated balloon angioplasty of left superficial femoral artery, stent of left superficial femoral artery, angioplasty of left popliteal artery, and failed angioplasty of left posterior tibial artery on 08-14-17 by Dr. Myra Gianotti.   The patient suffers from diabetes and has bilateral lower extremity ulcers.  ABIs were documented to show poor circulation bilaterally.  He returns today with c/o severe pain at his right 5th toe and lateral aspect left foot; lesser pain in left great toe, lesser pain from left shin to foot. He states this has been going on for about a couple months prior to the left revascularization surgery.  He states he has little or no problem with his right lower extremity, and that his right great toe ulcer is improving.  He has dry and wet gangrene in his left great toe, and dry gangrene in his left 5th toe.  The more proximal ulcers on the dorsum of his left foot and left shin are healing.    He states he used up his prescription for oxycodone 5 mg in 5 days, 30 tablets prescribed on 08-21-17 by M. Eveland PA-C, sig: take 1-2 tabs po q6h prn pain.    He is scheduled to follow up on 09-14-17 with Dr. Randie Heinz for wound check.   He is seen in wound care center at [facility]; last visit was on 08-29-17; at that visit all wounds were bigger and remained painful.  Tips of toes were blanched and cold; medihoney 2x2 and medipore tape dressing used. Pain in right foot, open wound of right great toe with damage to nail, sequela, open wound of left great toe, subsequent encounter. Pt would not allow debridement on toes as too painful.  All wounds remeasured with noted increase in  size of all wounds.  This is concerning.  Encouarged pt to return to MD.  We are unable to provide skilled treatment as limited debridement allowed due to pain.  Pt unalble to elevate LE's long due to increasing pain.  Instructed pt to leave all dressings in place (stated he had been removing some and cleaning and applying neosporin).  pt verbalized understanding.  Dressed all wounds with medihoney gel and gauze.  Three glucose readings from BMP in the last 3 weeks are 114, 122, and 202, no A1C result available.    He currently smokes 6-7 cigarettes/day, decreased from 1 ppd, started at age 56.   He takes Plavix and ASA, no anticoagulant.  He also takes a statin.   Left foot xray on 08-05-17: COMPARISON:  07/23/2017 FINDINGS: Degenerative changes in the first metatarsal-phalangeal joint. No evidence of acute fracture or dislocation. No focal bone lesion or bone destruction. Mild dorsal soft tissue swelling. No radiopaque soft tissue foreign bodies. Vascular calcifications. Probable bone cyst in the calcaneus. IMPRESSION: No acute bony abnormalities. Degenerative changes in the first metatarsal-phalangeal joint. Dorsal soft tissue swelling. Vascular calcifications.     The patient is able to complete their activities of daily living, but the pain in his left foot prevents him from sleeping much.    For VQI Use Only  PRE-ADM LIVING: Home  AMB STATUS: Ambulatory   Past Medical History:  Diagnosis  Date  . Coronary artery disease   . High cholesterol   . Hypertension   . MI (myocardial infarction) (HCC) 08/13/2014; 01/2016- 03/2016 X 3  . PAD (peripheral artery disease) (HCC)   . Peripheral vascular disease (HCC)   . Type II diabetes mellitus (HCC)     Past Surgical History:  Procedure Laterality Date  . ABDOMINAL AORTOGRAM W/LOWER EXTREMITY  08/14/2017  . ABDOMINAL AORTOGRAM W/LOWER EXTREMITY N/A 08/14/2017   Procedure: ABDOMINAL AORTOGRAM W/LOWER EXTREMITY;  Surgeon: Nada LibmanBrabham,  Vance W, MD;  Location: [facility];  Service: Cardiovascular;  Laterality: N/A;  . CORONARY ANGIOPLASTY WITH STENT PLACEMENT  01/2016; 03/2016   "1 + 1" (08/14/2017)  . LOWER EXTREMITY ANGIOGRAPHY N/A 08/20/2017   Procedure: LOWER EXTREMITY ANGIOGRAPHY-rt leg;  Surgeon: Sherren KernsFields, Charles E, MD;  [facility];  Service: Cardiovascular;  Laterality: N/A;  . PERIPHERAL VASCULAR ATHERECTOMY  08/14/2017   Procedure: PERIPHERAL VASCULAR ATHERECTOMY;  Surgeon: Nada LibmanBrabham, Vance W, MD;  Location: [facility];  Service: Cardiovascular;;  SFA   . PERIPHERAL VASCULAR BALLOON ANGIOPLASTY  08/14/2017   Procedure: PERIPHERAL VASCULAR BALLOON ANGIOPLASTY;  Surgeon: Nada LibmanBrabham, Vance W, MD;  Location: [facility];  Service: Cardiovascular;;  peroneal  . PERIPHERAL VASCULAR BALLOON ANGIOPLASTY  08/20/2017   Procedure: PERIPHERAL VASCULAR BALLOON ANGIOPLASTY;  Surgeon: Sherren KernsFields, Charles E, MD;  Location: [facility];  Service: Cardiovascular;;  . PERIPHERAL VASCULAR INTERVENTION  08/14/2017   Procedure: PERIPHERAL VASCULAR INTERVENTION;  Surgeon: Nada LibmanBrabham, Vance W, MD;  Location: [facility];  Service: Cardiovascular;;  SFA stent  . TONSILLECTOMY      Social History   Socioeconomic History  . Marital status: Married    Spouse name: Not on file  . Number of children: Not on file  . Years of education: Not on file  . Highest education level: Not on file  Social Needs  . Financial resource strain: Not on file  . Food insecurity - worry: Not on file  . Food insecurity - inability: Not on file  . Transportation needs - medical: Not on file  . Transportation needs - non-medical: Not on file  Occupational History  . Not on file  Tobacco Use  . Smoking status: Current Some Day Smoker    Packs/day: 0.33    Years: 43.00    Pack years: 14.19    Types: Cigarettes    Start date: 08/12/1974  . Smokeless tobacco: Never Used  Substance and Sexual Activity  . Alcohol use: Yes     Comment: 08/14/2017 "nothing since ~ 2012"  . Drug use: No  . Sexual activity: Not Currently  Other Topics Concern  . Not on file  Social History Narrative  . Not on file    No Known Allergies  Current Outpatient Medications on File Prior to Visit  Medication Sig Dispense Refill  . acetaminophen (TYLENOL) 500 MG tablet Take 1,000 mg by mouth every 6 (six) hours as needed for mild pain.    Marland Kitchen. aspirin EC 81 MG tablet Take 81 mg by mouth daily.    . clopidogrel (PLAVIX) 75 MG tablet Take 1 tablet (75 mg total) by mouth daily with breakfast. 30 tablet 11  . diclofenac (VOLTAREN) 50 MG EC tablet Take 1 tablet (50 mg total) by mouth 2 (two) times daily. 20 tablet 0  . gabapentin (NEURONTIN) 300 MG capsule Take 300 mg by mouth 2 (two) times daily.    Marland Kitchen. lisinopril-hydrochlorothiazide (PRINZIDE,ZESTORETIC) 20-25 MG tablet  Take 1 tablet by mouth 2 (two) times daily.  2  . metFORMIN (GLUCOPHAGE) 850 MG tablet Take 850 mg by mouth 2 (two) times daily with a meal.    . metoprolol tartrate (LOPRESSOR) 100 MG tablet Take 100 mg by mouth 2 (two) times daily.    . nitroGLYCERIN (NITROSTAT) 0.4 MG SL tablet Place 0.4 mg under the tongue every 5 (five) minutes as needed for chest pain.    . rosuvastatin (CRESTOR) 20 MG tablet Take 20 mg by mouth every evening.     . sulfamethoxazole-trimethoprim (BACTRIM,SEPTRA) 400-80 MG tablet Take 1 tablet by mouth 2 (two) times daily. 20 tablet 0  . HYDROcodone-acetaminophen (NORCO) 5-325 MG tablet Take 1-2 tablets by mouth every 6 (six) hours as needed. (Patient not taking: Reported on 09/03/2017) 20 tablet 0  . oxyCODONE (OXY IR/ROXICODONE) 5 MG immediate release tablet Take 1 tablet (5 mg total) by mouth every 6 (six) hours as needed for moderate pain. (Patient not taking: Reported on 09/03/2017) 30 tablet 0   No current facility-administered medications on file prior to visit.      Physical Examination  Vitals:   09/03/17 1129 09/03/17 1132  BP: (!) 167/75 (!)  150/76  Pulse: 94   Resp: 16   Temp: 97.6 F (36.4 C)   TempSrc: Oral   SpO2: 99%   Weight: 208 lb 1.6 oz (94.4 kg)    Body mass index is 31.64 kg/m.  PHYSICAL EXAMINATION: General: The patient appears his stated age.   HEENT:  No gross abnormalities Pulmonary: Respirations are non-labored Abdomen: Soft and non-tender Musculoskeletal: There are no major deformities.   Neurologic: No focal weakness or paresthesias are detected, Skin: Healing dry ulcer right great toe.  Left shin: two healing ulcers. Dorsal aspect left foot: healing ulcer. Left great toe tip and medial aspect with dry gangrene, scant amount of thick white exudate at base of gangrene lesion. Left 5th toe with dry gangrene at the base of toenail, no drainage. See photos below.  Psychiatric: The patient has normal affect. Cardiovascular: There is a regular rate and rhythm without significant murmur appreciated.   Vascular: Vessel Right Left  Radial Palpable Palpable  Aorta Not palpable N/A  Femoral 2+Palpable 2+Palpable  Popliteal Not palpable Not palpable  PT not Palpable, + brisk Doppler signal not Palpable, +brisk Doppler signal  DP not Palpable, + brisk Doppler signal not Palpable, + brisk Doppler signal    Left foot   Left foot    Left foot   Medical Decision Making  Leone Putman is a 62 y.o. male who presents s/p angioplasty right tibial peroneal trunk (3 x 60 balloon) on 08-20-17 by Dr. Darrick Penna for  gangrene of right first toe; subtotal occlusion right tibioperoneal trunk was found on angiogram.  He is also s/p atherectomy with drug-coated balloon angioplasty of left superficial femoral artery, stent of left superficial femoral artery, angioplasty of left popliteal artery, and failed angioplasty of left posterior tibial artery on 08-14-17 by Dr. Myra Gianotti.    He returns with c/o severe ischemic pain in his left foot that keeps him awake at night.  He has gangrene in his left great toe and left  5th toe.  Right great toe ulcer is healing.   Dr. Myra Gianotti spoke with and examined pt.  Will schedule aortogram, possible intervention left posterior tibial artery, on 09-05-17 by Dr. Randie Heinz.  Oxycodone/APAP 5-325 MG, 1 tab, po every 6 hours prn pain, disp #5, 0 refills, for ischemic  pain left foot.   The patient was counseled re smoking cessation and given several free resources re smoking cessation.   Charisse March, RN, MSN, FNP-C [facility] [place] Office: [phone]  09/03/2017, 11:33 AM  Clinic MD: Myra Gianotti

## 2017-09-03 NOTE — Patient Instructions (Signed)
Steps to Quit Smoking Smoking tobacco can be bad for your health. It can also affect almost every organ in your body. Smoking puts you and people around you at risk for many serious long-lasting (chronic) diseases. Quitting smoking is hard, but it is one of the best things that you can do for your health. It is never too late to quit. What are the benefits of quitting smoking? When you quit smoking, you lower your risk for getting serious diseases and conditions. They can include:  Lung cancer or lung disease.  Heart disease.  Stroke.  Heart attack.  Not being able to have children (infertility).  Weak bones (osteoporosis) and broken bones (fractures).  If you have coughing, wheezing, and shortness of breath, those symptoms may get better when you quit. You may also get sick less often. If you are pregnant, quitting smoking can help to lower your chances of having a baby of low birth weight. What can I do to help me quit smoking? Talk with your doctor about what can help you quit smoking. Some things you can do (strategies) include:  Quitting smoking totally, instead of slowly cutting back how much you smoke over a period of time.  Going to in-person counseling. You are more likely to quit if you go to many counseling sessions.  Using resources and support systems, such as: ? Online chats with a counselor. ? Phone quitlines. ? Printed self-help materials. ? Support groups or group counseling. ? Text messaging programs. ? Mobile phone apps or applications.  Taking medicines. Some of these medicines may have nicotine in them. If you are pregnant or breastfeeding, do not take any medicines to quit smoking unless your doctor says it is okay. Talk with your doctor about counseling or other things that can help you.  Talk with your doctor about using more than one strategy at the same time, such as taking medicines while you are also going to in-person counseling. This can help make  quitting easier. What things can I do to make it easier to quit? Quitting smoking might feel very hard at first, but there is a lot that you can do to make it easier. Take these steps:  Talk to your family and friends. Ask them to support and encourage you.  Call phone quitlines, reach out to support groups, or work with a counselor.  Ask people who smoke to not smoke around you.  Avoid places that make you want (trigger) to smoke, such as: ? Bars. ? Parties. ? Smoke-break areas at work.  Spend time with people who do not smoke.  Lower the stress in your life. Stress can make you want to smoke. Try these things to help your stress: ? Getting regular exercise. ? Deep-breathing exercises. ? Yoga. ? Meditating. ? Doing a body scan. To do this, close your eyes, focus on one area of your body at a time from head to toe, and notice which parts of your body are tense. Try to relax the muscles in those areas.  Download or buy apps on your mobile phone or tablet that can help you stick to your quit plan. There are many free apps, such as QuitGuide from the CDC (Centers for Disease Control and Prevention). You can find more support from smokefree.gov and other websites.  This information is not intended to replace advice given to you by your health care provider. Make sure you discuss any questions you have with your health care provider. Document Released: 04/22/2009 Document   Revised: 02/22/2016 Document Reviewed: 11/10/2014 Elsevier Interactive Patient Education  2018 Elsevier Inc.  

## 2017-09-05 ENCOUNTER — Ambulatory Visit (HOSPITAL_COMMUNITY)
Admission: RE | Disposition: A | Discharge status: Home / Self Care | Payer: Self-pay | Source: Ambulatory Visit | Attending: Vascular Surgery

## 2017-09-05 ENCOUNTER — Ambulatory Visit: Payer: No Typology Code available for payment source | Admitting: Family Medicine

## 2017-09-05 ENCOUNTER — Ambulatory Visit (HOSPITAL_COMMUNITY): Payer: Medicaid Other | Admitting: Physical Therapy

## 2017-09-05 ENCOUNTER — Telehealth (HOSPITAL_COMMUNITY): Payer: Self-pay | Admitting: Physical Therapy

## 2017-09-05 ENCOUNTER — Observation Stay (HOSPITAL_COMMUNITY)
Admission: RE | Admit: 2017-09-05 | Discharge: 2017-09-06 | Disposition: A | Discharge status: Home / Self Care | Payer: Medicaid Other | Source: Ambulatory Visit | Attending: Vascular Surgery | Admitting: Vascular Surgery

## 2017-09-05 DIAGNOSIS — I998 Other disorder of circulatory system: Secondary | ICD-10-CM

## 2017-09-05 DIAGNOSIS — I70245 Atherosclerosis of native arteries of left leg with ulceration of other part of foot: Secondary | ICD-10-CM | POA: Diagnosis not present

## 2017-09-05 DIAGNOSIS — Z7902 Long term (current) use of antithrombotics/antiplatelets: Secondary | ICD-10-CM | POA: Diagnosis not present

## 2017-09-05 DIAGNOSIS — I739 Peripheral vascular disease, unspecified: Secondary | ICD-10-CM | POA: Diagnosis present

## 2017-09-05 DIAGNOSIS — E1151 Type 2 diabetes mellitus with diabetic peripheral angiopathy without gangrene: Principal | ICD-10-CM | POA: Insufficient documentation

## 2017-09-05 DIAGNOSIS — E11621 Type 2 diabetes mellitus with foot ulcer: Secondary | ICD-10-CM | POA: Diagnosis not present

## 2017-09-05 DIAGNOSIS — I252 Old myocardial infarction: Secondary | ICD-10-CM | POA: Insufficient documentation

## 2017-09-05 DIAGNOSIS — Z7982 Long term (current) use of aspirin: Secondary | ICD-10-CM | POA: Diagnosis not present

## 2017-09-05 DIAGNOSIS — I10 Essential (primary) hypertension: Secondary | ICD-10-CM | POA: Diagnosis not present

## 2017-09-05 DIAGNOSIS — L97529 Non-pressure chronic ulcer of other part of left foot with unspecified severity: Secondary | ICD-10-CM | POA: Insufficient documentation

## 2017-09-05 DIAGNOSIS — Z7984 Long term (current) use of oral hypoglycemic drugs: Secondary | ICD-10-CM | POA: Diagnosis not present

## 2017-09-05 HISTORY — PX: PERIPHERAL VASCULAR ATHERECTOMY: CATH118256

## 2017-09-05 HISTORY — PX: ABDOMINAL AORTOGRAM W/LOWER EXTREMITY: CATH118223

## 2017-09-05 LAB — POCT I-STAT, CHEM 8
BUN: 27 mg/dL — AB (ref 6–20)
CHLORIDE: 106 mmol/L (ref 101–111)
Calcium, Ion: 1.22 mmol/L (ref 1.15–1.40)
Creatinine, Ser: 1.8 mg/dL — ABNORMAL HIGH (ref 0.61–1.24)
Glucose, Bld: 96 mg/dL (ref 65–99)
HEMATOCRIT: 29 % — AB (ref 39.0–52.0)
Hemoglobin: 9.9 g/dL — ABNORMAL LOW (ref 13.0–17.0)
Potassium: 5.2 mmol/L — ABNORMAL HIGH (ref 3.5–5.1)
SODIUM: 138 mmol/L (ref 135–145)
TCO2: 27 mmol/L (ref 22–32)

## 2017-09-05 LAB — CBC
HCT: 28 % — ABNORMAL LOW (ref 39.0–52.0)
HEMOGLOBIN: 8.9 g/dL — AB (ref 13.0–17.0)
MCH: 27.4 pg (ref 26.0–34.0)
MCHC: 31.8 g/dL (ref 30.0–36.0)
MCV: 86.2 fL (ref 78.0–100.0)
PLATELETS: 291 10*3/uL (ref 150–400)
RBC: 3.25 MIL/uL — AB (ref 4.22–5.81)
RDW: 13.5 % (ref 11.5–15.5)
WBC: 6.7 10*3/uL (ref 4.0–10.5)

## 2017-09-05 LAB — CREATININE, SERUM
CREATININE: 1.5 mg/dL — AB (ref 0.61–1.24)
GFR calc non Af Amer: 48 mL/min — ABNORMAL LOW (ref >60)
GFR, EST AFRICAN AMERICAN: 55 mL/min — AB (ref >60)

## 2017-09-05 LAB — GLUCOSE, CAPILLARY: Glucose-Capillary: 89 mg/dL (ref 65–99)

## 2017-09-05 SURGERY — ABDOMINAL AORTOGRAM W/LOWER EXTREMITY
Anesthesia: LOCAL

## 2017-09-05 MED ORDER — NITROGLYCERIN 0.4 MG SL SUBL: 0.4000 mg | SUBLINGUAL_TABLET | SUBLINGUAL | Status: DC | PRN

## 2017-09-05 MED ORDER — HYDRALAZINE HCL 20 MG/ML IJ SOLN: 5.0000 mg | INTRAMUSCULAR | Status: DC | PRN

## 2017-09-05 MED ORDER — HYDROMORPHONE HCL 1 MG/ML IJ SOLN
INTRAMUSCULAR | Status: AC
  Filled 2017-09-05: qty 0.5

## 2017-09-05 MED ORDER — OXYCODONE HCL 5 MG PO TABS
5.0000 mg | ORAL_TABLET | ORAL | Status: DC | PRN
  Administered 2017-09-05: 10 mg via ORAL
  Administered 2017-09-06: 5 mg via ORAL
  Filled 2017-09-05: qty 1

## 2017-09-05 MED ORDER — SODIUM CHLORIDE 0.9 % IV SOLN
INTRAVENOUS | Status: DC
  Administered 2017-09-05: 12:00:00 via INTRAVENOUS

## 2017-09-05 MED ORDER — MIDAZOLAM HCL 2 MG/2ML IJ SOLN
INTRAMUSCULAR | Status: DC | PRN
  Administered 2017-09-05: 1 mg via INTRAVENOUS

## 2017-09-05 MED ORDER — LIDOCAINE HCL 1 % IJ SOLN
INTRAMUSCULAR | Status: AC
  Filled 2017-09-05: qty 20

## 2017-09-05 MED ORDER — VERAPAMIL HCL 2.5 MG/ML IV SOLN
INTRAVENOUS | Status: AC
  Filled 2017-09-05: qty 2

## 2017-09-05 MED ORDER — LABETALOL HCL 5 MG/ML IV SOLN: 10.0000 mg | INTRAVENOUS | Status: DC | PRN

## 2017-09-05 MED ORDER — FENTANYL CITRATE (PF) 100 MCG/2ML IJ SOLN
INTRAMUSCULAR | Status: AC
  Filled 2017-09-05: qty 2

## 2017-09-05 MED ORDER — LIDOCAINE HCL (PF) 1 % IJ SOLN
INTRAMUSCULAR | Status: DC | PRN
  Administered 2017-09-05: 10 mL
  Administered 2017-09-05: 15 mL

## 2017-09-05 MED ORDER — HEPARIN (PORCINE) IN NACL 2-0.9 UNIT/ML-% IJ SOLN
INTRAMUSCULAR | Status: AC | PRN
  Administered 2017-09-05: 1000 mL via INTRA_ARTERIAL

## 2017-09-05 MED ORDER — ONDANSETRON HCL 4 MG/2ML IJ SOLN: 4.0000 mg | Freq: Four times a day (QID) | INTRAMUSCULAR | Status: DC | PRN

## 2017-09-05 MED ORDER — GABAPENTIN 300 MG PO CAPS
300.0000 mg | ORAL_CAPSULE | Freq: Two times a day (BID) | ORAL | Status: DC
  Administered 2017-09-05 – 2017-09-06 (×2): 300 mg via ORAL
  Filled 2017-09-05 (×2): qty 1

## 2017-09-05 MED ORDER — CLOPIDOGREL BISULFATE 75 MG PO TABS
75.0000 mg | ORAL_TABLET | Freq: Every day | ORAL | Status: DC
  Administered 2017-09-06: 75 mg via ORAL
  Filled 2017-09-05: qty 1

## 2017-09-05 MED ORDER — METOPROLOL TARTRATE 100 MG PO TABS
100.0000 mg | ORAL_TABLET | Freq: Two times a day (BID) | ORAL | Status: DC
  Administered 2017-09-05 – 2017-09-06 (×2): 100 mg via ORAL
  Filled 2017-09-05 (×2): qty 1

## 2017-09-05 MED ORDER — SODIUM CHLORIDE 0.9% FLUSH: 3.0000 mL | INTRAVENOUS | Status: DC | PRN

## 2017-09-05 MED ORDER — VIPERSLIDE LUBRICANT OPTIME
TOPICAL | Status: DC | PRN
  Administered 2017-09-05: 14:00:00 via SURGICAL_CAVITY

## 2017-09-05 MED ORDER — FENTANYL CITRATE (PF) 100 MCG/2ML IJ SOLN
INTRAMUSCULAR | Status: DC | PRN
  Administered 2017-09-05 (×4): 50 ug via INTRAVENOUS

## 2017-09-05 MED ORDER — ACETAMINOPHEN 325 MG PO TABS: 650.0000 mg | ORAL_TABLET | ORAL | Status: DC | PRN

## 2017-09-05 MED ORDER — HEPARIN (PORCINE) IN NACL 2-0.9 UNIT/ML-% IJ SOLN
INTRAMUSCULAR | Status: AC
  Filled 2017-09-05: qty 1000

## 2017-09-05 MED ORDER — HEPARIN SODIUM (PORCINE) 1000 UNIT/ML IJ SOLN
INTRAMUSCULAR | Status: AC
  Filled 2017-09-05: qty 1

## 2017-09-05 MED ORDER — SODIUM CHLORIDE 0.9 % IV SOLN: INTRAVENOUS | Status: AC

## 2017-09-05 MED ORDER — HYDROMORPHONE HCL 1 MG/ML IJ SOLN
0.5000 mg | INTRAMUSCULAR | Status: DC | PRN
  Administered 2017-09-05: 0.5 mg via INTRAVENOUS
  Administered 2017-09-05 – 2017-09-06 (×4): 1 mg via INTRAVENOUS
  Filled 2017-09-05 (×4): qty 1

## 2017-09-05 MED ORDER — HEPARIN SODIUM (PORCINE) 5000 UNIT/ML IJ SOLN
5000.0000 [IU] | Freq: Three times a day (TID) | INTRAMUSCULAR | Status: DC
  Administered 2017-09-05 – 2017-09-06 (×2): 5000 [IU] via SUBCUTANEOUS
  Filled 2017-09-05 (×2): qty 1

## 2017-09-05 MED ORDER — ROSUVASTATIN CALCIUM 20 MG PO TABS
20.0000 mg | ORAL_TABLET | Freq: Every evening | ORAL | Status: DC
  Administered 2017-09-05: 20 mg via ORAL
  Filled 2017-09-05: qty 1

## 2017-09-05 MED ORDER — SODIUM CHLORIDE 0.9 % IV SOLN: 250.0000 mL | INTRAVENOUS | Status: DC | PRN

## 2017-09-05 MED ORDER — IODIXANOL 320 MG/ML IV SOLN
INTRAVENOUS | Status: DC | PRN
  Administered 2017-09-05: 30 mL via INTRAVENOUS

## 2017-09-05 MED ORDER — OXYCODONE HCL 5 MG PO TABS
ORAL_TABLET | ORAL | Status: AC
Start: 2017-09-05 — End: ?
  Filled 2017-09-05: qty 2

## 2017-09-05 MED ORDER — NITROGLYCERIN IN D5W 200-5 MCG/ML-% IV SOLN
INTRAVENOUS | Status: AC
  Filled 2017-09-05: qty 250

## 2017-09-05 MED ORDER — SULFAMETHOXAZOLE-TRIMETHOPRIM 400-80 MG PO TABS
1.0000 | ORAL_TABLET | Freq: Two times a day (BID) | ORAL | Status: DC
  Administered 2017-09-05 – 2017-09-06 (×2): 1 via ORAL
  Filled 2017-09-05 (×2): qty 1

## 2017-09-05 MED ORDER — SODIUM CHLORIDE 0.9% FLUSH
3.0000 mL | Freq: Two times a day (BID) | INTRAVENOUS | Status: DC
  Administered 2017-09-05 – 2017-09-06 (×2): 3 mL via INTRAVENOUS

## 2017-09-05 MED ORDER — ASPIRIN EC 81 MG PO TBEC
81.0000 mg | DELAYED_RELEASE_TABLET | Freq: Every day | ORAL | Status: DC
  Administered 2017-09-05 – 2017-09-06 (×2): 81 mg via ORAL
  Filled 2017-09-05 (×2): qty 1

## 2017-09-05 MED ORDER — MIDAZOLAM HCL 2 MG/2ML IJ SOLN
INTRAMUSCULAR | Status: AC
  Filled 2017-09-05: qty 2

## 2017-09-05 SURGICAL SUPPLY — 28 items
BALLN COYOTE OTW 3X80X150 (BALLOONS) ×3
BALLOON COYOTE OTW 3X80X150 (BALLOONS) IMPLANT
CATH CXI 2.6F 65 ST (CATHETERS) ×3
CATH OMNI FLUSH 5F 65CM (CATHETERS) ×1 IMPLANT
CATH QUICKCROSS .035X135CM (MICROCATHETER) ×1 IMPLANT
CATH SPRT STRG 65X2.6FR BRD (CATHETERS) IMPLANT
COVER PRB 48X5XTLSCP FOLD TPE (BAG) IMPLANT
COVER PROBE 5X48 (BAG) ×3
DEVICE CLOSURE MYNXGRIP 6/7F (Vascular Products) ×1 IMPLANT
DEVICE ONE SNARE 10MM (MISCELLANEOUS) ×1 IMPLANT
DIAMONDBACK CLASSIC OAS 1.5MM (CATHETERS) ×3
GLIDEWIRE ADV .035X260CM (WIRE) ×1 IMPLANT
GUIDEWIRE STR TIP .014X300X8 (WIRE) ×1 IMPLANT
HEMOSTASIS PAD V PLUS (HEMOSTASIS) ×1 IMPLANT
KIT ENCORE 26 ADVANTAGE (KITS) ×1 IMPLANT
KIT MICROPUNCTURE NIT STIFF (SHEATH) ×1 IMPLANT
KIT PV (KITS) ×3 IMPLANT
LUBRICANT VIPERSLIDE CORONARY (MISCELLANEOUS) ×1 IMPLANT
SHEATH HIGHFLEX ANSEL 6FRX55 (SHEATH) ×1 IMPLANT
SHEATH MICROPUNCTURE PEDAL 4FR (SHEATH) ×1 IMPLANT
SHEATH PINNACLE 5F 10CM (SHEATH) ×1 IMPLANT
SHEATH PINNACLE 6F 10CM (SHEATH) ×1 IMPLANT
SYSTEM DIMNDBCK CLSC OAS 1.5MM (CATHETERS) IMPLANT
TRANSDUCER W/STOPCOCK (MISCELLANEOUS) ×3 IMPLANT
TRAY PV CATH (CUSTOM PROCEDURE TRAY) ×3 IMPLANT
WIRE BENTSON .035X145CM (WIRE) ×1 IMPLANT
WIRE G V18X300CM (WIRE) ×1 IMPLANT
WIRE VIPER WIRECTO 0.014 (WIRE) ×1 IMPLANT

## 2017-09-05 NOTE — Telephone Encounter (Signed)
Called pt re missed appointment.   No answer.  Pt reminded of next appointment.  Virgina Organynthia Marica Trentham, PT CLT [phone]

## 2017-09-05 NOTE — Op Note (Signed)
Patient name: Avigdor Dollar MRN: [id number] DOB: 12-03-1955 Sex: male  09/05/2017 Pre-operative Diagnosis: critical left lower extremity ischemia Post-operative diagnosis:  Same Surgeon:  Erlene Quan C. Donzetta Matters, MD Procedure Performed: 1.  US guided cannulation of right common femoral artery and left posterior tibial artery 2.  Left lower extremity angiogram 3.  csi 1.5 classic atherectomy of left tibioperoneal trunk and posterior tibial artery 4.  Balloon angioplasty of left tibioperoneal trunk and posterior tibial artery with 71m balloon 5.  Percutaneous closure of right common femoral artery with mynx 6.  Moderate sedation for 63 minutes with fentanyl and versed   Indications: 62year old male who is undergone left SFA stenting and right tibioperoneal trunk balloon angioplasty.  He has persistent ongoing pain and wounds in his left foot is indicated for intervention on his tibial arteries.  Findings: The popliteal artery is patent and the tibioperoneal trunk occludes on the left.  He reconstitutes posterior tibial artery as well as the distal peroneal artery and also there is evidence of a dorsalis pedis.  After intervention there is 0% residual stenosis of the tibioperoneal trunk and runoff is via the posterior tibial artery to the level of the the arch of the foot.   Procedure:  The patient was identified in the holding area and taken to room 8.  The patient was then placed supine on the table and prepped and draped in the usual sterile fashion.  A time out was called.  Ultrasound was used to evaluate the right common femoral artery.  It was patent .  A digital ultrasound image was acquired.  A micropuncture needle was used to access the right common femoral artery under ultrasound guidance.  An 018 wire was advanced without resistance and a micropuncture sheath was placed.  The 018 wire was removed and a benson wire was placed.  The micropuncture sheath was exchanged for a 5 french  sheath.  I used an Omni catheter to go up and over the bifurcation with a Glidewire and then used a quick cross catheter to get down the SFA.  We then exchanged over the Glidewire advantage for a long 6 FPakistansheath.  We performed left lower extremity angiogram via quick cross catheter from just below the knee demonstrated the above findings.  Patient had been given 10,000 units of heparin.  We attempted to cross from above with a V 18 wire were unable to engage the posterior tibial artery as there appeared to be flush occlusion of the tibioperoneal trunk.  We then used ultrasound guidance to cannulate the posterior tibial artery with a micropuncture needle at the level of the ankle.  A wire passed easily followed by micropuncture sheath.  We then able to cross up the posterior tibial artery and tibioperoneal trunk with a V 18 wire.  We exchanged with CXI catheter for a 014 Viper wire.  We then went from above and were able to snare the viper wire and get through and through access.  We then used the CSI device to perform atherectomy of the occluded segments leaving that we were true luminal.  We then performed a 3 mm balloon angioplasty of the occluded segment.  Completion angiogram demonstrated no residual stenosis or dissection.  We then advanced our balloon distally in the posterior tibial artery and inflated this to 4 atm and withdrew our wire completely out of the body and pulled our posterior tibial micropuncture sheath.  We then advanced a V 14 wire through our balloon  to maintain access.  Completion angiogram demonstrated no residual stenosis and runoff was all the way to the foot via the posterior tibial artery.  With this we removed our balloon and our wire.  We then exchanged for a short 6 French sheath in the right common femoral artery.  We then deployed a mynx device.  The posterior tibial access site was hemostatic.  Pressure was held for a few minutes at the right common femoral artery site and  this was also hemostatic.  Patient tolerated procedure well without immediate complication.  Contrast: 30 cc.   Brandon C. Donzetta Matters, MD [facility] Office: [phone] Pager: [phone]

## 2017-09-05 NOTE — H&P (Signed)
   History and Physical Update  The patient was interviewed and re-examined.  The patient's previous History and Physical has been reviewed and is unchanged from recent office visit. Plan for left lower extremity angiogram with intervention.  Georgeana Oertel C. Randie Heinzain, MD [facility] [place] Office: [phone] Pager: [phone]  09/05/2017, 12:54 PM

## 2017-09-06 ENCOUNTER — Encounter (HOSPITAL_COMMUNITY): Payer: Self-pay

## 2017-09-06 ENCOUNTER — Other Ambulatory Visit: Payer: Self-pay

## 2017-09-06 DIAGNOSIS — E1151 Type 2 diabetes mellitus with diabetic peripheral angiopathy without gangrene: Secondary | ICD-10-CM | POA: Diagnosis not present

## 2017-09-06 LAB — CBC
HCT: 26.7 % — ABNORMAL LOW (ref 39.0–52.0)
Hemoglobin: 8.6 g/dL — ABNORMAL LOW (ref 13.0–17.0)
MCH: 27.8 pg (ref 26.0–34.0)
MCHC: 32.2 g/dL (ref 30.0–36.0)
MCV: 86.4 fL (ref 78.0–100.0)
PLATELETS: 275 10*3/uL (ref 150–400)
RBC: 3.09 MIL/uL — ABNORMAL LOW (ref 4.22–5.81)
RDW: 13.6 % (ref 11.5–15.5)
WBC: 6.8 10*3/uL (ref 4.0–10.5)

## 2017-09-06 LAB — BASIC METABOLIC PANEL
Anion gap: 7 (ref 5–15)
BUN: 19 mg/dL (ref 6–20)
CALCIUM: 8.9 mg/dL (ref 8.9–10.3)
CO2: 22 mmol/L (ref 22–32)
CREATININE: 1.46 mg/dL — AB (ref 0.61–1.24)
Chloride: 107 mmol/L (ref 101–111)
GFR calc Af Amer: 57 mL/min — ABNORMAL LOW (ref >60)
GFR calc non Af Amer: 49 mL/min — ABNORMAL LOW (ref >60)
GLUCOSE: 125 mg/dL — AB (ref 65–99)
Potassium: 5.6 mmol/L — ABNORMAL HIGH (ref 3.5–5.1)
Sodium: 136 mmol/L (ref 135–145)

## 2017-09-06 MED ORDER — NEPRO/CARBSTEADY PO LIQD
237.0000 mL | Freq: Two times a day (BID) | ORAL | Status: DC
  Administered 2017-09-06: 237 mL via ORAL

## 2017-09-06 MED FILL — Lidocaine HCl Local Inj 1%: INTRAMUSCULAR | Qty: 20 | Status: AC

## 2017-09-06 MED FILL — Heparin Sodium (Porcine) 2 Unit/ML in Sodium Chloride 0.9%: INTRAMUSCULAR | Qty: 1000 | Status: AC

## 2017-09-06 MED FILL — Heparin Sodium (Porcine) Inj 1000 Unit/ML: INTRAMUSCULAR | Qty: 10 | Status: AC

## 2017-09-06 NOTE — Progress Notes (Signed)
  Progress Note    09/06/2017 10:51 AM 1 Day Post-Op  Subjective:  Feet are feeling better  Vitals:   09/06/17 0529 09/06/17 0807  BP: 128/64 139/66  Pulse: 81   Resp: 15   Temp: 99.6 F (37.6 C)   SpO2: 95%     Physical Exam: aaox3 Right femoral site is soft PT bilaterally are biphasic       CBC    Component Value Date/Time   WBC 6.8 09/06/2017 0356   RBC 3.09 (L) 09/06/2017 0356   HGB 8.6 (L) 09/06/2017 0356   HCT 26.7 (L) 09/06/2017 0356   PLT 275 09/06/2017 0356   MCV 86.4 09/06/2017 0356   MCH 27.8 09/06/2017 0356   MCHC 32.2 09/06/2017 0356   RDW 13.6 09/06/2017 0356   LYMPHSABS 2.8 08/05/2017 0308   MONOABS 0.4 08/05/2017 0308   EOSABS 0.4 08/05/2017 0308   BASOSABS 0.1 08/05/2017 0308    BMET    Component Value Date/Time   NA 136 09/06/2017 0356   K 5.6 (H) 09/06/2017 0356   CL 107 09/06/2017 0356   CO2 22 09/06/2017 0356   GLUCOSE 125 (H) 09/06/2017 0356   BUN 19 09/06/2017 0356   CREATININE 1.46 (H) 09/06/2017 0356   CALCIUM 8.9 09/06/2017 0356   GFRNONAA 49 (L) 09/06/2017 0356   GFRAA 57 (L) 09/06/2017 0356    INR No results found for: INR   Intake/Output Summary (Last 24 hours) at 09/06/2017 1051 Last data filed at 09/06/2017 0800 Gross per 24 hour  Intake 709 ml  Output 850 ml  Net -141 ml     Assessment:  62 y.o. male is s/p revascularization of left tp trunk and pt  Plan: Ok for dc F/u in 2-3 weeks for wound checks   [place] C. Randie Heinzain, MD [facility] [place] Office: [phone] Pager: [phone]  09/06/2017 10:51 AM

## 2017-09-06 NOTE — Discharge Summary (Signed)
Physician Discharge Summary   Patient ID: Joel Hebert [id number] 62 y.o. [birth year]  Admit date: 09/05/2017  Discharge date and time: 09/06/2017 11:28 AM   Admitting Physician: Maeola Harman, MD   Discharge Physician: same  Admission Diagnoses: PAD (peripheral artery disease) [facility]) [I73.9]  Discharge Diagnoses: same  Admission Condition: fair  Discharged Condition: fair  Indication for Admission: critical left lower extremity ischemia  Hospital Course: Mr. Rubi is a 62 year old male who came in as an outpatient for Atherectomy left tibioperoneal trunk and posterior tibial artery with balloon angioplasty left tibioperoneal trunk and posterior tibial artery by Dr. Randie Heinz on 09/05/17 due to critical left lower extremity ischemia.  He tolerated this procedure well and was admitted to the hospital overnight.  POD #1 right groin catheterization site is unremarkable and soft.  Morning drawn labs do not demonstrate any significant electrolyte abnormalities.  Dry gangrene of bilateral feet is stable.  He will continue his aspirin, statin, and Plavix regimen.  He will follow-up in about 2-3 weeks with Dr. Randie Heinz for wound care.  Discharge instructions were reviewed with the patient and he voices his understanding.  He will be discharged this morning in stable condition.  Consults: None  Treatments: surgery: Atherectomy left tibioperoneal trunk and posterior tibial artery with balloon angioplasty left tibioperoneal trunk and posterior tibial artery by Dr. Randie Heinz on 09/05/17  Discharge Exam: Vitals:   09/06/17 0529 09/06/17 0807  BP: 128/64 139/66  Pulse: 81   Resp: 15   Temp: 99.6 F (37.6 C)   SpO2: 95%    See progress note 09/06/17   Disposition: 01-Home or Self Care  Patient Instructions:  Allergies as of 09/06/2017   No Known Allergies     Medication List    TAKE these medications   acetaminophen 500 MG tablet Commonly known as:  TYLENOL Take 1,000 mg by mouth  every 6 (six) hours as needed for mild pain.   aspirin EC 81 MG tablet Take 81 mg by mouth daily.   clopidogrel 75 MG tablet Commonly known as:  PLAVIX Take 1 tablet (75 mg total) by mouth daily with breakfast.   diclofenac 50 MG EC tablet Commonly known as:  VOLTAREN Take 1 tablet (50 mg total) by mouth 2 (two) times daily.   gabapentin 300 MG capsule Commonly known as:  NEURONTIN Take 300 mg by mouth 2 (two) times daily.   HYDROcodone-acetaminophen 5-325 MG tablet Commonly known as:  NORCO Take 1-2 tablets by mouth every 6 (six) hours as needed.   lisinopril-hydrochlorothiazide 20-25 MG tablet Commonly known as:  PRINZIDE,ZESTORETIC Take 1 tablet by mouth 2 (two) times daily.   metFORMIN 850 MG tablet Commonly known as:  GLUCOPHAGE Take 850 mg by mouth 2 (two) times daily with a meal.   metoprolol tartrate 100 MG tablet Commonly known as:  LOPRESSOR Take 100 mg by mouth 2 (two) times daily.   nitroGLYCERIN 0.4 MG SL tablet Commonly known as:  NITROSTAT Place 0.4 mg under the tongue every 5 (five) minutes as needed for chest pain.   oxyCODONE 5 MG immediate release tablet Commonly known as:  Oxy IR/ROXICODONE Take 1 tablet (5 mg total) by mouth every 6 (six) hours as needed for moderate pain.   oxyCODONE-acetaminophen 5-325 MG tablet Commonly known as:  PERCOCET/ROXICET Take 1 tablet by mouth every 6 (six) hours as needed for severe pain.   rosuvastatin 20 MG tablet Commonly known as:  CRESTOR Take 20 mg by mouth every evening.  sulfamethoxazole-trimethoprim 400-80 MG tablet Commonly known as:  BACTRIM,SEPTRA Take 1 tablet by mouth 2 (two) times daily.      Activity: activity as tolerated Diet: diabetic diet Wound Care: none needed  Follow-up with Dr. Randie Heinzain in 3 weeks.  SignedEmilie Rutter: Aithan Farrelly 09/06/2017 12:40 PM

## 2017-09-12 ENCOUNTER — Ambulatory Visit (HOSPITAL_COMMUNITY): Payer: Medicaid Other | Attending: *Deleted | Admitting: Physical Therapy

## 2017-09-12 ENCOUNTER — Telehealth (HOSPITAL_COMMUNITY): Payer: Self-pay | Admitting: Physical Therapy

## 2017-09-12 DIAGNOSIS — S81802D Unspecified open wound, left lower leg, subsequent encounter: Secondary | ICD-10-CM | POA: Insufficient documentation

## 2017-09-12 DIAGNOSIS — S91201S Unspecified open wound of right great toe with damage to nail, sequela: Secondary | ICD-10-CM | POA: Insufficient documentation

## 2017-09-12 DIAGNOSIS — M79671 Pain in right foot: Secondary | ICD-10-CM | POA: Insufficient documentation

## 2017-09-12 DIAGNOSIS — M79672 Pain in left foot: Secondary | ICD-10-CM | POA: Insufficient documentation

## 2017-09-12 DIAGNOSIS — S91102D Unspecified open wound of left great toe without damage to nail, subsequent encounter: Secondary | ICD-10-CM | POA: Insufficient documentation

## 2017-09-12 NOTE — Telephone Encounter (Signed)
Called patient about his missed appointment.  Pt has forgotten.  He had another balloon angioplasty on his left leg on 09/05/2017.  He requested to keep his appointment next week.   Virgina Organynthia Russell, PT CLT [phone]

## 2017-09-14 ENCOUNTER — Ambulatory Visit (INDEPENDENT_AMBULATORY_CARE_PROVIDER_SITE_OTHER): Payer: Medicaid Other | Admitting: Vascular Surgery

## 2017-09-14 ENCOUNTER — Other Ambulatory Visit: Payer: Self-pay

## 2017-09-14 ENCOUNTER — Encounter: Payer: Self-pay | Admitting: Vascular Surgery

## 2017-09-14 VITALS — BP 179/79 | HR 82 | Temp 97.5°F | Resp 16 | Ht 68.0 in | Wt 204.0 lb

## 2017-09-14 DIAGNOSIS — I739 Peripheral vascular disease, unspecified: Secondary | ICD-10-CM | POA: Diagnosis not present

## 2017-09-14 MED ORDER — OXYCODONE-ACETAMINOPHEN 5-325 MG PO TABS: 1.0000 | ORAL_TABLET | ORAL | 0 refills | Status: DC | PRN

## 2017-09-14 NOTE — Progress Notes (Signed)
Patient ID: Joel Hebert, male   DOB: 11/13/1955, 62 y.o.   MRN: [id number]  Reason for Consult: PAD (2-3 wk f/u to check feet, S/p R LE angio. )   Referred by No ref. provider found  Subjective:     HPI:  Joel Hebert is a 62 y.o. male follows up for wound check after having undergone left sided SFA stenting followed by TP trunk balloon angioplasty and atherectomy.  He also had TP trunk balloon angioplasty and on the right side.  His wounds today are continuing to give him pain particularly on the left small toe.  Otherwise there is been no progression.  He does have swelling states that he cannot elevate his legs given the amount of pain that he is having. He remains on aspirin and Plavix.  Past Medical History:  Diagnosis Date  . Coronary artery disease   . High cholesterol   . Hypertension   . MI (myocardial infarction) (HCC) 08/13/2014; 01/2016- 03/2016 X 3  . PAD (peripheral artery disease) (HCC)   . Peripheral vascular disease (HCC)   . Type II diabetes mellitus (HCC)    Family History  Problem Relation Age of Onset  . Heart failure Brother   . Heart disease Brother    Past Surgical History:  Procedure Laterality Date  . ABDOMINAL AORTOGRAM W/LOWER EXTREMITY  08/14/2017  . ABDOMINAL AORTOGRAM W/LOWER EXTREMITY N/A 08/14/2017   Procedure: ABDOMINAL AORTOGRAM W/LOWER EXTREMITY;  Surgeon: Nada LibmanBrabham, Vance W, MD;  [facility];  Service: Cardiovascular;  Laterality: N/A;  . ABDOMINAL AORTOGRAM W/LOWER EXTREMITY N/A 09/05/2017   Procedure: ABDOMINAL AORTOGRAM W/LOWER EXTREMITY;  Surgeon: Maeola Harmanain, Leighana Neyman Christopher, MD;  [facility];  Service: Cardiovascular;  Laterality: N/A;  . CORONARY ANGIOPLASTY WITH STENT PLACEMENT  01/2016; 03/2016   "1 + 1" (08/14/2017)  . LOWER EXTREMITY ANGIOGRAPHY N/A 08/20/2017   Procedure: LOWER EXTREMITY ANGIOGRAPHY-rt leg;  Surgeon: Sherren KernsFields, Charles E, MD;  [facility];  Service:  Cardiovascular;  Laterality: N/A;  . PERIPHERAL VASCULAR ATHERECTOMY  08/14/2017   Procedure: PERIPHERAL VASCULAR ATHERECTOMY;  Surgeon: Nada LibmanBrabham, Vance W, MD;  Location: [facility];  Service: Cardiovascular;;  SFA   . PERIPHERAL VASCULAR ATHERECTOMY Left 09/05/2017   Procedure: PERIPHERAL VASCULAR ATHERECTOMY;  Surgeon: Maeola Harmanain, Delayne Sanzo Christopher, MD;  [facility];  Service: Cardiovascular;  Laterality: Left;  Tibioperoneal and posterior tibial  . PERIPHERAL VASCULAR BALLOON ANGIOPLASTY  08/14/2017   Procedure: PERIPHERAL VASCULAR BALLOON ANGIOPLASTY;  Surgeon: Nada LibmanBrabham, Vance W, MD;  [facility];  Service: Cardiovascular;;  peroneal  . PERIPHERAL VASCULAR BALLOON ANGIOPLASTY  08/20/2017   Procedure: PERIPHERAL VASCULAR BALLOON ANGIOPLASTY;  Surgeon: Sherren KernsFields, Charles E, MD;  [facility];  Service: Cardiovascular;;  . PERIPHERAL VASCULAR INTERVENTION  08/14/2017   Procedure: PERIPHERAL VASCULAR INTERVENTION;  Surgeon: Nada LibmanBrabham, Vance W, MD;  [facility]: [facility];  Service: Cardiovascular;;  SFA stent  . TONSILLECTOMY      Short Social History:  Social History   Tobacco Use  . Smoking status: Current Some Day Smoker    Packs/day: 0.33    Years: 43.00    Pack years: 14.19    Types: Cigarettes    Start date: 08/12/1974  . Smokeless tobacco: Never Used  . Tobacco comment: 2-3 cigarettes occasionally  Substance Use Topics  . Alcohol use: Yes    Comment: 08/14/2017 "nothing since ~ 2012"    No  Known Allergies  Current Outpatient Medications  Medication Sig Dispense Refill  . acetaminophen (TYLENOL) 500 MG tablet Take 1,000 mg by mouth every 6 (six) hours as needed for mild pain.    Marland Kitchen aspirin EC 81 MG tablet Take 81 mg by mouth daily.    . clopidogrel (PLAVIX) 75 MG tablet Take 1 tablet (75 mg total) by mouth daily with breakfast. 30 tablet 11  . diclofenac (VOLTAREN) 50 MG EC tablet Take 1 tablet (50 mg total) by mouth 2 (two) times  daily. 20 tablet 0  . gabapentin (NEURONTIN) 300 MG capsule Take 300 mg by mouth 2 (two) times daily.    Marland Kitchen lisinopril-hydrochlorothiazide (PRINZIDE,ZESTORETIC) 20-25 MG tablet Take 1 tablet by mouth 2 (two) times daily.  2  . metFORMIN (GLUCOPHAGE) 850 MG tablet Take 850 mg by mouth 2 (two) times daily with a meal.    . metoprolol tartrate (LOPRESSOR) 100 MG tablet Take 100 mg by mouth 2 (two) times daily.    . nitroGLYCERIN (NITROSTAT) 0.4 MG SL tablet Place 0.4 mg under the tongue every 5 (five) minutes as needed for chest pain.    . rosuvastatin (CRESTOR) 20 MG tablet Take 20 mg by mouth every evening.     . sulfamethoxazole-trimethoprim (BACTRIM,SEPTRA) 400-80 MG tablet Take 1 tablet by mouth 2 (two) times daily. 20 tablet 0   No current facility-administered medications for this visit.     Review of Systems  Constitutional:  Constitutional negative. HENT: HENT negative.  Eyes: Eyes negative.  Cardiovascular: Cardiovascular negative.  GI: Gastrointestinal negative.  Skin: Positive for wound.  Neurological: Neurological negative. Hematologic: Hematologic/lymphatic negative.  Psychiatric: Psychiatric negative.        Objective:  Objective   Vitals:   09/14/17 1222  Weight: 204 lb (92.5 kg)  Height: 5\' 8"  (1.727 m)   Body mass index is 31.02 kg/m.  Physical Exam  Constitutional: He is oriented to person, place, and time. He appears well-developed.  HENT:  Head: Normocephalic.  Eyes: Pupils are equal, round, and reactive to light.  Neck: Normal range of motion.  Cardiovascular: Normal rate.  Pulmonary/Chest: Effort normal.  Abdominal: Soft.  Musculoskeletal: He exhibits edema.  Neurological: He is alert and oriented to person, place, and time.  Psychiatric: He has a normal mood and affect. His behavior is normal. Judgment and thought content normal.             Assessment/Plan:     62 year old male status post revascularization of both lower extremities now  on aspirin Plavix.  He is having significant pain particularly over the left small toe.  I have given him 40 Percocet and told him this is the last prescription we will give him short of having other surgery.  We will see him in 2-3 weeks to track wound progression and get ABIs at that time.  Should he have persistent pain he may need amputation of his left small toe but I am concerned about healing.  He demonstrates good understanding in the presence of his family.     Maeola Harman MD [facility]

## 2017-09-18 ENCOUNTER — Other Ambulatory Visit: Payer: Self-pay

## 2017-09-18 DIAGNOSIS — I739 Peripheral vascular disease, unspecified: Secondary | ICD-10-CM

## 2017-09-19 ENCOUNTER — Encounter (HOSPITAL_COMMUNITY): Payer: Self-pay | Admitting: Physical Therapy

## 2017-09-19 ENCOUNTER — Ambulatory Visit (HOSPITAL_COMMUNITY): Payer: Medicaid Other | Admitting: Physical Therapy

## 2017-09-19 ENCOUNTER — Other Ambulatory Visit: Payer: Self-pay

## 2017-09-19 DIAGNOSIS — S91201S Unspecified open wound of right great toe with damage to nail, sequela: Secondary | ICD-10-CM | POA: Diagnosis present

## 2017-09-19 DIAGNOSIS — S91102D Unspecified open wound of left great toe without damage to nail, subsequent encounter: Secondary | ICD-10-CM

## 2017-09-19 DIAGNOSIS — S81802D Unspecified open wound, left lower leg, subsequent encounter: Secondary | ICD-10-CM

## 2017-09-19 DIAGNOSIS — M79671 Pain in right foot: Secondary | ICD-10-CM

## 2017-09-19 DIAGNOSIS — M79672 Pain in left foot: Secondary | ICD-10-CM | POA: Diagnosis present

## 2017-09-19 NOTE — Therapy (Addendum)
[facility] [address] [place], [place], [address] Phone: [phone]   Fax:  [phone]  Wound Care Therapy  Patient Details  Name: Joel Hebert MRN: [id number] Date of Birth: [birth year] Referring Provider: Glorianne Manchester    Encounter Date: 09/19/2017  PT End of Session - 09/19/17 1243    Visit Number  6    Number of Visits  18    Date for PT Re-Evaluation  10/31/17 reassess weekly on Wed.     Authorization Type  none    Authorization - Visit Number  5    Authorization - Number of Visits  18    PT Start Time  1115    PT Stop Time  1215    PT Time Calculation (min)  60 min    Activity Tolerance  Patient limited by pain    Behavior During Therapy  [facility] for tasks assessed/performed       Past Medical History:  Diagnosis Date  . Coronary artery disease   . High cholesterol   . Hypertension   . MI (myocardial infarction) (HCC) 08/13/2014; 01/2016- 03/2016 X 3  . PAD (peripheral artery disease) (HCC)   . Peripheral vascular disease (HCC)   . Type II diabetes mellitus (HCC)     Past Surgical History:  Procedure Laterality Date  . ABDOMINAL AORTOGRAM W/LOWER EXTREMITY  08/14/2017  . ABDOMINAL AORTOGRAM W/LOWER EXTREMITY N/A 08/14/2017   Procedure: ABDOMINAL AORTOGRAM W/LOWER EXTREMITY;  Surgeon: Nada Libman, MD;  [facility];  Service: Cardiovascular;  Laterality: N/A;  . ABDOMINAL AORTOGRAM W/LOWER EXTREMITY N/A 09/05/2017   Procedure: ABDOMINAL AORTOGRAM W/LOWER EXTREMITY;  Surgeon: Maeola Harman, MD;  Location: [facility] INVAS[facility] CV LAB;  Service: Cardiovascular;  Laterality: N/A;  . CORONARY ANGIOPLASTY WITH STENT PLACEMENT  01/2016; 03/2016   "1 + 1" (08/14/2017)  . LOWER EXTREMITY ANGIOGRAPHY N/A 08/20/2017   Procedure: LOWER EXTREMITY ANGIOGRAPHY-rt leg;  Surgeon: Sherren Kerns, MD;  Location: [facility] INVASIVE CV LAB;  Service: Cardiovascular;  Laterality: N/A;  . PERIPHERAL VASCULAR ATHERECTOMY   08/14/2017   Procedure: PERIPHERAL VASCULAR ATHERECTOMY;  Surgeon: Nada Libman, MD;  [facility];  Service: Cardiovascular;;  SFA   . PERIPHERAL VASCULAR ATHERECTOMY Left 09/05/2017   Procedure: PERIPHERAL VASCULAR ATHERECTOMY;  Surgeon: Maeola Harman, MD;  Location: [facility] INVASIVE CV LAB;  Service: Cardiovascular;  Laterality: Left;  Tibioperoneal and posterior tibial  . PERIPHERAL VASCULAR BALLOON ANGIOPLASTY  08/14/2017   Procedure: PERIPHERAL VASCULAR BALLOON ANGIOPLASTY;  Surgeon: Nada Libman, MD;  [facility]: [facility];  Service: Cardiovascular;;  peroneal  . PERIPHERAL VASCULAR BALLOON ANGIOPLASTY  08/20/2017   Procedure: PERIPHERAL VASCULAR BALLOON ANGIOPLASTY;  Surgeon: Sherren Kerns, MD;  [facility];  Service: Cardiovascular;;  . PERIPHERAL VASCULAR INTERVENTION  08/14/2017   Procedure: PERIPHERAL VASCULAR INTERVENTION;  Surgeon: Nada Libman, MD;  [facility]: [facility];  Service: Cardiovascular;;  SFA stent  . TONSILLECTOMY      There were no vitals filed for this visit.     [facility] PT Assessment - 09/19/17 0001      Assessment   Medical Diagnosis  B foot ulcers     Referring Provider  Rocelle Muse     Onset Date/Surgical Date  07/12/17    Next MD Visit  unknown    Prior Therapy  none      Precautions   Precautions  None  Restrictions   Weight Bearing Restrictions  No      Home Environment   Living Environment  Private residence      Prior Function   Level of Independence  Independent    Vocation  Retired              Wound Therapy - 09/19/17 1228    Subjective  Pt states that he has been in three times for surgery on his legs trying to get the blood flow to improve.      Patient and Family Stated Goals  decreased pain and wounds to heal.     Date of Onset  07/12/17    Prior Treatments  self care of soaking in bleach water.     Pain Assessment  0-10    Pain Score  9     Pain Type   Chronic pain    Pain Orientation  Left    Pain Descriptors / Indicators  Burning;Shooting    Pain Onset  On-going    Patients Stated Pain Goal  0    Pain Intervention(s)  Emotional support    Multiple Pain Sites  Yes    Evaluation and Treatment Procedures Explained to Patient/Family  Yes    Evaluation and Treatment Procedures  agreed to    Wound Properties Date First Assessed: 08/22/17 Wound Type: Other (Comment) Location: Leg Location Orientation: Left Wound Description (Comments): two wounds on Lt shin superior 1.5x 1.2 ; inferior .25 x 1  Present on Admission: Yes   Dressing Type  -- OTA    Dressing Changed  New    Dressing Status  None    Dressing Change Frequency  PRN    Site / Wound Assessment  Dry;Pink;Brown    % Wound base Red or Granulating  45%    % Wound base Yellow/Fibrinous Exudate  55%    Peri-wound Assessment  Edema    Wound Length (cm)  -- superior 1.2 x .7x .1; inferior .5x.5x.3    Drainage Amount  None    Drainage Description  Serous    Non-staged Wound Description  Full thickness    Treatment  Cleansed;Debridement (Selective);Other (Comment) dressing     Wound Properties Date First Assessed: 08/22/17 Time First Assessed: 1631 Wound Type: Other (Comment) Location: Foot Location Orientation: Left Wound Description (Comments): two dorsal foot wounds  Present on Admission: Yes   Dressing Type  None    Dressing Changed  New    Dressing Status  None    Dressing Change Frequency  PRN    Site / Wound Assessment  Dry;Brown    % Wound base Red or Granulating  0% was 10    % Wound base Yellow/Fibrinous Exudate  100% was 90    Peri-wound Assessment  Edema    Wound Length (cm)  -- both forefoot wound are .8 cm diameter with unkonwn depth    Drainage Amount  None    Treatment  Cleansed;Debridement (Selective)    Wound Properties Date First Assessed: 08/22/17 Time First Assessed: 1454 Wound Type: Other (Comment) Location: Toe (Comment  which one) Location Orientation: Left  Wound Description (Comments): Along nail bed 2x 1  Present on Admission: Yes   Dressing Type  None    Dressing Status  None OTA    Dressing Change Frequency  PRN    Site / Wound Assessment  Black;Painful;Red    % Wound base Red or Granulating  0%    % Wound base Black/Eschar  100%  Wound Length (cm)  -- from IP jt encompassing entire [place] to ant, lateral,posteri    Drainage Amount  None    Treatment  Cleansed;Debridement (Selective);Other (Comment)    Wound Properties Date First Assessed: 08/22/17 Wound Type: Other (Comment) Location: Toe (Comment  which one) Location Orientation: Left Wound Description (Comments): little toe    Dressing Type  None    Dressing Changed  New    Dressing Status  None    Dressing Change Frequency  PRN    Site / Wound Assessment  Dry;Black;Painful;Pink    % Wound base Red or Granulating  0%    % Wound base Black/Eschar  100%    Wound Length (cm)  3 cm was 2    Wound Width (cm)  2.7 cm was 1.5    Wound Surface Area (cm^2)  8.1 cm^2    Drainage Amount  None    Treatment  Cleansed;Debridement (Selective)    Wound Properties Date First Assessed: 07/26/17 Time First Assessed: [id number] Wound Type: Other (Comment) Location: Toe (Comment  which one) Location Orientation: Right Wound Description (Comments): superior aspect of toe black 1.2 x 1 cm  Present on Admission: Yes   Dressing Type  None    Dressing Status  None    Dressing Change Frequency  PRN    Site / Wound Assessment  Dry;Black    % Wound base Red or Granulating  0%    % Wound base Black/Eschar  100%    Wound Length (cm)  2 cm was1.5    Wound Width (cm)  2.5 cm was 2.0    Wound Surface Area (cm^2)  5 cm^2    Drainage Amount  None    Treatment  Cleansed;Other (Comment)    Selective Debridement - Location  shin, forefoot and toes on LT LE    Selective Debridement - Tools Used  Forceps;Scalpel;Scissors    Selective Debridement - Tissue Removed  devitalized tissue     Wound Therapy - Clinical Statement  Pt has not been seen in this clinic since mid February.  PT had a Lt balloon angioplasty to his Lt superficial femoral artery with stent placement on 08/14/2017; on 2/12 he had a right tibioperoneal angioplasty and on 09/06/2017 he had a Lt LE balloon angioplasty with stenting.   PT Rt toe wound has no opening at this time but tip continues to be black.  His Lt leg, foot and toe wounds all have openings therefore tissue that was able to be debrided has been debrided.  Pt bilateral  LE has increased warmth and noted pink areas indicative of improved circulation. Pt will continue to benefit from skilled physical therapy now that blood flow has been returned to create a healing environment.   Pt treatment to increase to 2x a week now that blood flow is improved via stenting for 6 weeks.     Wound Therapy - Functional Problem List  pain with walking     Factors Delaying/Impairing Wound Healing  Altered sensation;Diabetes Mellitus;Vascular compromise    Hydrotherapy Plan  Debridement;Dressing change;Patient/family education    Wound Therapy - Frequency  2X / week for 6 more weeks.     Wound Therapy - Current Recommendations  Surgery consult    Wound Therapy - Follow Up Recommendations  Other (comment)    Wound Plan  Encouraged to complete gentle retro massaging to decrease edema.      Dressing   medihoney 2x2 and kling  PT Short Term Goals - 08/22/17 1647      PT SHORT TERM GOAL #1   Title  Pt to have seen a cardiologist re ABI bilaterally     Time  2    Period  Weeks    Status  Achieved      PT SHORT TERM GOAL #2   Title  Wound  on Rt toe to only have 50% black escar    Time  3    Period  Weeks    Status  On-going        PT Long Term Goals - 08/22/17 1647      PT LONG TERM GOAL #1   Title  Pt wound on Rt great toe to be healed     Time  12    Period  Weeks    Status  On-going    Target Date  10/10/17      PT LONG TERM GOAL #2   Title  Blisters on Lt great toe  and little toe to be healed     Time  12    Period  Weeks    Status  On-going      PT LONG TERM GOAL #3   Title  Wounds on Lt shin and foot to be healed     Time  8    Period  Weeks    Status  New      PT LONG TERM GOAL #4   Title  wound on Lt great toe to be healed     Time  8    Period  Weeks    Status  New            Plan - 09/19/17 1244    Clinical Impression Statement  see above     Rehab Potential  Good    PT Frequency  2x / week    PT Duration  6 weeks 8 more weeks     PT Treatment/Interventions  ADLs/Self Care Home Management debridement as needed dressing change     PT Next Visit Plan  Pt has now been stented on both LE and will need debridement to remove necrotic tissue to create an environment that is conductive to healing.  Recommend to increase to twice a week for the next six weeks for wound care.      Consulted and Agree with Plan of Care  Patient       Patient will benefit from skilled therapeutic intervention in order to improve the following deficits and impairments:  Pain, Decreased skin integrity  Visit Diagnosis: No diagnosis found.     Problem List Patient Active Problem List   Diagnosis Date Noted  . PAD (peripheral artery disease) [facility]) 08/14/2017    Virgina Organ, PT CLT [phone] 09/19/2017, 12:47 PM  [place] [facility] [address] [place], [place], [address] Phone: [phone]   Fax:  [phone]  Name: Joel Hebert MRN: [id number] Date of Birth: [birth year]

## 2017-09-24 ENCOUNTER — Ambulatory Visit (HOSPITAL_COMMUNITY): Payer: Medicaid Other | Admitting: Physical Therapy

## 2017-09-24 DIAGNOSIS — M79672 Pain in left foot: Secondary | ICD-10-CM | POA: Diagnosis not present

## 2017-09-24 DIAGNOSIS — M79671 Pain in right foot: Secondary | ICD-10-CM

## 2017-09-24 DIAGNOSIS — S91102D Unspecified open wound of left great toe without damage to nail, subsequent encounter: Secondary | ICD-10-CM

## 2017-09-24 DIAGNOSIS — S91201S Unspecified open wound of right great toe with damage to nail, sequela: Secondary | ICD-10-CM

## 2017-09-24 NOTE — Therapy (Addendum)
[facility] [address] [place], [place], [address] Phone: [phone]   Fax:  [phone]  Wound Care Therapy  Patient Details  Name: Joel Hebert MRN: [id number] Date of Birth: 04/28/1956 Referring Provider: Glorianne Manchesterocelle Muse    Encounter Date: 09/24/2017  PT End of Session - 09/24/17 1433    Visit Number  7    Number of Visits  18    Date for PT Re-Evaluation  10/31/17 reassess weekly on Wed.     Authorization Type  none    Authorization - Visit Number  5    Authorization - Number of Visits  18    PT Start Time  1120    PT Stop Time  1210    PT Time Calculation (min)  50 min    Activity Tolerance  Patient limited by pain    Behavior During Therapy  [facility] assessed/performed       Past Medical History:  Diagnosis Date  . Coronary artery disease   . High cholesterol   . Hypertension   . MI (myocardial infarction) (HCC) 08/13/2014; 01/2016- 03/2016 X 3  . PAD (peripheral artery disease) (HCC)   . Peripheral vascular disease (HCC)   . Type II diabetes mellitus (HCC)     Past Surgical History:  Procedure Laterality Date  . ABDOMINAL AORTOGRAM W/LOWER EXTREMITY  08/14/2017  . ABDOMINAL AORTOGRAM W/LOWER EXTREMITY N/A 08/14/2017   Procedure: ABDOMINAL AORTOGRAM W/LOWER EXTREMITY;  Surgeon: Nada LibmanBrabham, Vance W, MD;  [facility];  Service: Cardiovascular;  Laterality: N/A;  . ABDOMINAL AORTOGRAM W/LOWER EXTREMITY N/A 09/05/2017   Procedure: ABDOMINAL AORTOGRAM W/LOWER EXTREMITY;  Surgeon: Maeola Harmanain, Brandon Christopher, MD;  [facility];  Service: Cardiovascular;  Laterality: N/A;  . CORONARY ANGIOPLASTY WITH STENT PLACEMENT  01/2016; 03/2016   "1 + 1" (08/14/2017)  . LOWER EXTREMITY ANGIOGRAPHY N/A 08/20/2017   Procedure: LOWER EXTREMITY ANGIOGRAPHY-rt leg;  Surgeon: Sherren KernsFields, Charles E, MD;  [facility];  Service: Cardiovascular;  Laterality: N/A;  . PERIPHERAL VASCULAR ATHERECTOMY   08/14/2017   Procedure: PERIPHERAL VASCULAR ATHERECTOMY;  Surgeon: Nada LibmanBrabham, Vance W, MD;  [facility]: [facility];  Service: Cardiovascular;;  SFA   . PERIPHERAL VASCULAR ATHERECTOMY Left 09/05/2017   Procedure: PERIPHERAL VASCULAR ATHERECTOMY;  Surgeon: Maeola Harmanain, Brandon Christopher, MD;  [facility];  Service: Cardiovascular;  Laterality: Left;  Tibioperoneal and posterior tibial  . PERIPHERAL VASCULAR BALLOON ANGIOPLASTY  08/14/2017   Procedure: PERIPHERAL VASCULAR BALLOON ANGIOPLASTY;  Surgeon: Nada LibmanBrabham, Vance W, MD;  [facility]: [facility];  Service: Cardiovascular;;  peroneal  . PERIPHERAL VASCULAR BALLOON ANGIOPLASTY  08/20/2017   Procedure: PERIPHERAL VASCULAR BALLOON ANGIOPLASTY;  Surgeon: Sherren KernsFields, Charles E, MD;  [facility];  Service: Cardiovascular;;  . PERIPHERAL VASCULAR INTERVENTION  08/14/2017   Procedure: PERIPHERAL VASCULAR INTERVENTION;  Surgeon: Nada LibmanBrabham, Vance W, MD;  [facility];  Service: Cardiovascular;;  SFA stent  . TONSILLECTOMY      There were no vitals filed for this visit.              Wound Therapy - 09/24/17 1428    Subjective  Pt states he kept the bandages on his big toes but took the one off his little toe.  States it hurts the worst    Patient and Family Stated Goals  decreased pain and wounds to heal.     Date of Onset  07/12/17    Prior Treatments  PT had a Lt balloon angioplasty to his Lt superficial femoral artery with stent placement on 08/14/2017; on 2/12 he had a right tibioperoneal angioplasty and on 09/06/2017 he had a Lt LE balloon angioplasty with stenting.    Pain Assessment  0-10    Pain Score  9     Pain Type  Chronic pain    Pain Orientation  Left small toe    Pain Descriptors / Indicators  Burning    Pain Onset  On-going    Patients Stated Pain Goal  0    Pain Intervention(s)  Other (Comment) lowering extremity    Multiple Pain Sites  Yes    Evaluation and Treatment Procedures  Explained to Patient/Family  Yes    Evaluation and Treatment Procedures  agreed to    Wound Properties Date First Assessed: 08/22/17 Wound Type: Other (Comment) Location: Leg Location Orientation: Left Wound Description (Comments): two wounds on Lt shin superior 1.5x 1.2 ; inferior .25 x 1  Present on Admission: Yes   Dressing Type  None OTA    Dressing Changed  Other (Comment)    Dressing Status  None    Dressing Change Frequency  PRN    Site / Wound Assessment  Dry;Pink;Brown    % Wound base Red or Granulating  75%    % Wound base Yellow/Fibrinous Exudate  25%    Peri-wound Assessment  Edema    Drainage Amount  None    Treatment  Cleansed;Debridement (Selective)    Wound Properties Date First Assessed: 08/22/17 Time First Assessed: 1631 Wound Type: Other (Comment) Location: Foot Location Orientation: Left Wound Description (Comments): two dorsal foot wounds  Present on Admission: Yes   Dressing Type  None    Dressing Status  None    Dressing Change Frequency  PRN    Site / Wound Assessment  Dry;Brown    % Wound base Red or Granulating  0% was 10    % Wound base Yellow/Fibrinous Exudate  100% was 90    Peri-wound Assessment  Edema    Drainage Amount  None    Treatment  Cleansed    Wound Properties Date First Assessed: 08/22/17 Time First Assessed: 1454 Wound Type: Other (Comment) Location: Toe (Comment  which one) Location Orientation: Left Wound Description (Comments): Along nail bed 2x 1  Present on Admission: Yes   Dressing Type  Gauze (Comment)    Dressing Changed  Changed    Dressing Status  None OTA    Dressing Change Frequency  PRN    Site / Wound Assessment  Black;Painful;Red    % Wound base Red or Granulating  5%    % Wound base Black/Eschar  95%    Drainage Amount  None    Treatment  Cleansed;Debridement (Selective)    Wound Properties Date First Assessed: 08/22/17 Wound Type: Other (Comment) Location: Toe (Comment  which one) Location Orientation: Left Wound Description  (Comments): little toe    Dressing Type  None    Dressing Changed  New    Dressing Status  None    Dressing Change Frequency  PRN    Site / Wound Assessment  Dry;Black;Painful;Pink    % Wound base Red or Granulating  0%    % Wound base Black/Eschar  100%    Drainage Amount  None    Treatment  Cleansed;Debridement (Selective)    Wound Properties Date First Assessed: 07/26/17 Time First Assessed: 9604 Wound Type: Other (Comment)  Location: Toe (Comment  which one) Location Orientation: Right Wound Description (Comments): superior aspect of toe black 1.2 x 1 cm  Present on Admission: Yes   Dressing Type  None;Gauze (Comment)    Dressing Changed  Changed    Dressing Status  None    Dressing Change Frequency  PRN    Site / Wound Assessment  Dry;Black    % Wound base Red or Granulating  5%    % Wound base Black/Eschar  95%    Drainage Amount  None    Treatment  Cleansed;Debridement (Selective)    Selective Debridement - Location  toes on LT and Rt LE    Selective Debridement - Tools Used  Forceps;Scissors    Selective Debridement - Tissue Removed  devitalized tissue     Wound Therapy - Clinical Statement  Noted improvement in circulation to bilateral feet.  No dressing on Lt dorsal foot or small toe, however dressings still intact on bilateral great toes.  All wounds dry wtih perimeter dry and calloused skin. Able to debride majority of dry skin awary from borders of bilateral great toes and Lt small toe wounds.  Pt remains hypersensitive to direct debridement of wounds, however able to remove some tissue from the borders.  All wounds remain dry, so added medihoney colloid to wounds to attempt to loosen hardened tissue.      Wound Therapy - Functional Problem List  pain with walking     Factors Delaying/Impairing Wound Healing  Altered sensation;Diabetes Mellitus;Vascular compromise    Hydrotherapy Plan  Debridement;Dressing change;Patient/family education    Wound Therapy - Frequency  2X / week  for 6 more weeks.     Wound Therapy - Current Recommendations  Surgery consult    Wound Therapy - Follow Up Recommendations  Other (comment)    Wound Plan  Encouraged to keep bandages in place and not disturb or change them unless needed.  continue with woundcare.     Dressing   medihoney colloid 2x2 medipore tape                PT Short Term Goals - 08/22/17 1647      PT SHORT TERM GOAL #1   Title  Pt to have seen a cardiologist re ABI bilaterally     Time  2    Period  Weeks    Status  Achieved      PT SHORT TERM GOAL #2   Title  Wound  on Rt toe to only have 50% black escar    Time  3    Period  Weeks    Status  On-going        PT Long Term Goals - 08/22/17 1647      PT LONG TERM GOAL #1   Title  Pt wound on Rt great toe to be healed     Time  12    Period  Weeks    Status  On-going    Target Date  10/10/17      PT LONG TERM GOAL #2   Title  Blisters on Lt great toe and little toe to be healed     Time  12    Period  Weeks    Status  On-going      PT LONG TERM GOAL #3   Title  Wounds on Lt shin and foot to be healed     Time  8    Period  Weeks    Status  New      PT LONG TERM GOAL #4   Title  wound on Lt great toe to be healed     Time  8    Period  Weeks    Status  New              Patient will benefit from skilled therapeutic intervention in order to improve the following deficits and impairments:     Visit Diagnosis: Pain in left foot  Pain in right foot  Open wound of right great toe with damage to nail, sequela  Open wound of left great toe, subsequent encounter     Problem List Patient Active Problem List   Diagnosis Date Noted  . PAD (peripheral artery disease) (HCC) 08/14/2017   Lurena Nida, PTA/CLT [phone]  Lurena Nida 09/24/2017, 4:59 PM  [place] [address] [place], [place], [address] Phone: [phone]   Fax:  [phone]  Name: Joel Hebert MRN: [id number] Date of Birth: 16-Apr-1956

## 2017-09-26 ENCOUNTER — Ambulatory Visit (HOSPITAL_COMMUNITY): Payer: Medicaid Other | Admitting: Physical Therapy

## 2017-09-26 DIAGNOSIS — M79672 Pain in left foot: Secondary | ICD-10-CM

## 2017-09-26 DIAGNOSIS — M79671 Pain in right foot: Secondary | ICD-10-CM

## 2017-09-26 DIAGNOSIS — S91102D Unspecified open wound of left great toe without damage to nail, subsequent encounter: Secondary | ICD-10-CM

## 2017-09-26 DIAGNOSIS — S91201S Unspecified open wound of right great toe with damage to nail, sequela: Secondary | ICD-10-CM

## 2017-09-26 NOTE — Therapy (Signed)
[facility] [address] [place], [place], [address] Phone: [phone]   Fax:  [phone]  Wound Care Therapy  Patient Details  Name: Raman Featherston MRN: [id number] Date of Birth: 08-01-1955 Referring Provider: Glorianne Manchester    Encounter Date: 09/26/2017  PT End of Session - 09/26/17 1223    Visit Number  8    Number of Visits  18    Date for PT Re-Evaluation  10/31/17 reassess weekly on Wed.     Authorization Type  cert 1/32-4/40    Authorization - Visit Number  5    Authorization - Number of Visits  18    PT Start Time  1118    PT Stop Time  1202    PT Time Calculation (min)  44 min    Activity Tolerance  Patient limited by pain    Behavior During Therapy  [facility] for tasks assessed/performed       Past Medical History:  Diagnosis Date  . Coronary artery disease   . High cholesterol   . Hypertension   . MI (myocardial infarction) (HCC) 08/13/2014; 01/2016- 03/2016 X 3  . PAD (peripheral artery disease) (HCC)   . Peripheral vascular disease (HCC)   . Type II diabetes mellitus (HCC)     Past Surgical History:  Procedure Laterality Date  . ABDOMINAL AORTOGRAM W/LOWER EXTREMITY  08/14/2017  . ABDOMINAL AORTOGRAM W/LOWER EXTREMITY N/A 08/14/2017   Procedure: ABDOMINAL AORTOGRAM W/LOWER EXTREMITY;  Surgeon: Nada Libman, MD;  [facility];  Service: Cardiovascular;  Laterality: N/A;  . ABDOMINAL AORTOGRAM W/LOWER EXTREMITY N/A 09/05/2017   Procedure: ABDOMINAL AORTOGRAM W/LOWER EXTREMITY;  Surgeon: Maeola Harman, MD;  Location: [facility] INVASIVE CV LAB;  Service: Cardiovascular;  Laterality: N/A;  . CORONARY ANGIOPLASTY WITH STENT PLACEMENT  01/2016; 03/2016   "1 + 1" (08/14/2017)  . LOWER EXTREMITY ANGIOGRAPHY N/A 08/20/2017   Procedure: LOWER EXTREMITY ANGIOGRAPHY-rt leg;  Surgeon: Sherren Kerns, MD;  Location: [facility] INV[facility] CV LAB;  Service: Cardiovascular;  Laterality: N/A;  . PERIPHERAL VASCULAR  ATHERECTOMY  08/14/2017   Procedure: PERIPHERAL VASCULAR ATHERECTOMY;  Surgeon: Nada Libman, MD;  [facility];  Service: Cardiovascular;;  SFA   . PERIPHERAL VASCULAR ATHERECTOMY Left 09/05/2017   Procedure: PERIPHERAL VASCULAR ATHERECTOMY;  Surgeon: Maeola Harman, MD;  [facility] INV[facility] CV LAB;  Service: Cardiovascular;  Laterality: Left;  Tibioperoneal and posterior tibial  . PERIPHERAL VASCULAR BALLOON ANGIOPLASTY  08/14/2017   Procedure: PERIPHERAL VASCULAR BALLOON ANGIOPLASTY;  Surgeon: Nada Libman, MD;  [facility];  Service: Cardiovascular;;  peroneal  . PERIPHERAL VASCULAR BALLOON ANGIOPLASTY  08/20/2017   Procedure: PERIPHERAL VASCULAR BALLOON ANGIOPLASTY;  Surgeon: Sherren Kerns, MD;  [facility]: [facility];  Service: Cardiovascular;;  . PERIPHERAL VASCULAR INTERVENTION  08/14/2017   Procedure: PERIPHERAL VASCULAR INTERVENTION;  Surgeon: Nada Libman, MD;  [facility]: [facility];  Service: Cardiovascular;;  SFA stent  . TONSILLECTOMY      There were no vitals filed for this visit.              Wound Therapy - 09/26/17 1204    Subjective  Pt states he returns to vascular MD on Friday.  States he's going to ask him about the swelling in his legs and if he can surgically debride the areas.  Debridment remains painful for pateint, however reports overall improvement in pain since last visit  Patient and Family Stated Goals  decreased pain and wounds to heal.     Date of Onset  07/12/17    Prior Treatments  PT had a Lt balloon angioplasty to his Lt superficial femoral artery with stent placement on 08/14/2017; on 2/12 he had a right tibioperoneal angioplasty and on 09/06/2017 he had a Lt LE balloon angioplasty with stenting.    Pain Assessment  0-10    Pain Score  6     Pain Type  Chronic pain    Pain Orientation  Left    Pain Descriptors / Indicators  Burning    Evaluation and Treatment Procedures  Explained to Patient/Family  Yes    Evaluation and Treatment Procedures  agreed to    Wound Properties Date First Assessed: 08/22/17 Wound Type: Other (Comment) Location: Leg Location Orientation: Left Wound Description (Comments): two wounds on Lt shin superior 1.5x 1.2 ; inferior .25 x 1  Present on Admission: Yes   % Wound base Red or Granulating  100%    Wound Properties Date First Assessed: 08/22/17 Time First Assessed: 1631 Wound Type: Other (Comment) Location: Foot Location Orientation: Left Wound Description (Comments): two dorsal foot wounds  Present on Admission: Yes   % Wound base Red or Granulating  100%    Wound Properties Date First Assessed: 08/22/17 Time First Assessed: 1454 Wound Type: Other (Comment) Location: Toe (Comment  which one) Location Orientation: Left Wound Description (Comments): Along nail bed 2x 1  Present on Admission: Yes   Dressing Type  Gauze (Comment) medihoney colloid    Dressing Changed  Changed    Dressing Status  Intact;Old drainage    Dressing Change Frequency  PRN    Site / Wound Assessment  Black;Painful;Red    % Wound base Red or Granulating  5%    % Wound base Black/Eschar  95%    Wound Length (cm)  2.6 cm was 1.5 cm on 2/20    Wound Width (cm)  3.3 cm was 2.5 cm on 2/20    Wound Depth (cm)  -- unknown    Wound Surface Area (cm^2)  8.58 cm^2    Drainage Amount  Scant    Drainage Description  Serosanguineous    Treatment  Cleansed;Debridement (Selective)    Wound Properties Date First Assessed: 08/22/17 Wound Type: Other (Comment) Location: Toe (Comment  which one) Location Orientation: Left Wound Description (Comments): little toe    Dressing Type  Gauze (Comment) medihoney colloid    Dressing Changed  Changed    Dressing Status  Intact    Dressing Change Frequency  PRN    Site / Wound Assessment  Dry;Black;Painful;Pink    % Wound base Red or Granulating  0%    % Wound base Black/Eschar  100%    Wound Length (cm)  2.6 cm was 1.5 cm on 2/20     Wound Width (cm)  3 cm was 2 cm on 2/20    Wound Depth (cm)  -- unknown    Wound Surface Area (cm^2)  7.8 cm^2    Drainage Amount  Scant    Drainage Description  Serosanguineous    Treatment  Cleansed;Debridement (Selective)    Wound Properties Date First Assessed: 07/26/17 Time First Assessed: [id number] Wound Type: Other (Comment) Location: Toe (Comment  which one) Location Orientation: Right Wound Description (Comments): superior aspect of toe black 1.2 x 1 cm  Present on Admission: Yes   Dressing Type  Gauze (Comment)    Dressing Changed  Changed    Dressing Status  Intact    Dressing Change Frequency  PRN    Site / Wound Assessment  Dry;Black    % Wound base Red or Granulating  0%    % Wound base Black/Eschar  100%    Wound Length (cm)  1.4 cm was 1.5 on 2/20    Wound Width (cm)  2.2 cm was 2 cm on 2/20    Wound Depth (cm)  -- unknown    Wound Surface Area (cm^2)  3.08 cm^2    Drainage Amount  None    Treatment  Cleansed;Debridement (Selective)    Selective Debridement - Location  toes on LT and Rt LE    Selective Debridement - Tools Used  Forceps;Scissors    Selective Debridement - Tissue Removed  devitalized tissue     Wound Therapy - Clinical Statement  Overall with decreased pain and noted improvement with gait today.  Lt LE with warmth noted in toes, good perfusion.  Rt foot remains cold to touch.. Both LE's with edema/induration present from knee level down to toes.  Remeasured wounds today with little change in Rt LE and increased  size in Lt LE noted, however due to removal of tissue perimeter of wounds.  Dressings remained intact.  Cleansed areas well and debrided, mostly around wounds as patient could not tolerate much into wounds.  Addition of medihoney colloid helped to further loosen hardened eschar.  Continued with these dressings.  Returns to vascular surgeon on Friday      Wound Therapy - Functional Problem List  pain with walking     Factors Delaying/Impairing Wound Healing   Altered sensation;Diabetes Mellitus;Vascular compromise    Hydrotherapy Plan  Debridement;Dressing change;Patient/family education    Wound Therapy - Frequency  2X / week    Wound Therapy - Current Recommendations  Surgery consult    Wound Therapy - Follow Up Recommendations  Other (comment)    Wound Plan  Encouraged to keep bandages in place and not disturb or change them unless needed.  continue with woundcare.     Dressing   medihoney colloid 2x2 medipore tape                PT Short Term Goals - 08/22/17 1647      PT SHORT TERM GOAL #1   Title  Pt to have seen a cardiologist re ABI bilaterally     Time  2    Period  Weeks    Status  Achieved      PT SHORT TERM GOAL #2   Title  Wound  on Rt toe to only have 50% black escar    Time  3    Period  Weeks    Status  On-going        PT Long Term Goals - 08/22/17 1647      PT LONG TERM GOAL #1   Title  Pt wound on Rt great toe to be healed     Time  12    Period  Weeks    Status  On-going    Target Date  10/10/17      PT LONG TERM GOAL #2   Title  Blisters on Lt great toe and little toe to be healed     Time  12    Period  Weeks    Status  On-going      PT LONG TERM GOAL #3   Title  Wounds on Lt shin and  foot to be healed     Time  8    Period  Weeks    Status  New      PT LONG TERM GOAL #4   Title  wound on Lt great toe to be healed     Time  8    Period  Weeks    Status  New              Patient will benefit from skilled therapeutic intervention in order to improve the following deficits and impairments:     Visit Diagnosis: Pain in left foot  Pain in right foot  Open wound of right great toe with damage to nail, sequela  Open wound of left great toe, subsequent encounter     Problem List Patient Active Problem List   Diagnosis Date Noted  . PAD (peripheral artery disease) (HCC) 08/14/2017   Lurena Nida, PTA/CLT [phone]  Lurena Nida 09/26/2017, 12:23 PM  [facility] [address] [place], [place], [address] Phone: [phone]   Fax:  [phone]  Name: Krystopher Kuenzel MRN: [id number] Date of Birth: 12-20-1955

## 2017-09-28 ENCOUNTER — Encounter: Payer: Self-pay | Admitting: Family

## 2017-09-28 ENCOUNTER — Ambulatory Visit (INDEPENDENT_AMBULATORY_CARE_PROVIDER_SITE_OTHER): Payer: Self-pay | Admitting: Family

## 2017-09-28 ENCOUNTER — Ambulatory Visit (HOSPITAL_COMMUNITY)
Admission: RE | Admit: 2017-09-28 | Discharge: 2017-09-28 | Disposition: A | Discharge status: Home / Self Care | Payer: Medicaid Other | Source: Ambulatory Visit | Attending: Family | Admitting: Family

## 2017-09-28 ENCOUNTER — Other Ambulatory Visit: Payer: Self-pay

## 2017-09-28 VITALS — BP 178/85 | HR 82 | Temp 97.3°F | Resp 18 | Ht 68.0 in | Wt 204.0 lb

## 2017-09-28 DIAGNOSIS — F17200 Nicotine dependence, unspecified, uncomplicated: Secondary | ICD-10-CM

## 2017-09-28 DIAGNOSIS — I739 Peripheral vascular disease, unspecified: Secondary | ICD-10-CM | POA: Diagnosis present

## 2017-09-28 DIAGNOSIS — R609 Edema, unspecified: Secondary | ICD-10-CM

## 2017-09-28 MED ORDER — GABAPENTIN 300 MG PO CAPS: 300.0000 mg | ORAL_CAPSULE | Freq: Two times a day (BID) | ORAL | 0 refills | Status: DC

## 2017-09-28 NOTE — Patient Instructions (Signed)
Steps to Quit Smoking Smoking tobacco can be bad for your health. It can also affect almost every organ in your body. Smoking puts you and people around you at risk for many serious long-lasting (chronic) diseases. Quitting smoking is hard, but it is one of the best things that you can do for your health. It is never too late to quit. What are the benefits of quitting smoking? When you quit smoking, you lower your risk for getting serious diseases and conditions. They can include:  Lung cancer or lung disease.  Heart disease.  Stroke.  Heart attack.  Not being able to have children (infertility).  Weak bones (osteoporosis) and broken bones (fractures).  If you have coughing, wheezing, and shortness of breath, those symptoms may get better when you quit. You may also get sick less often. If you are pregnant, quitting smoking can help to lower your chances of having a baby of low birth weight. What can I do to help me quit smoking? Talk with your doctor about what can help you quit smoking. Some things you can do (strategies) include:  Quitting smoking totally, instead of slowly cutting back how much you smoke over a period of time.  Going to in-person counseling. You are more likely to quit if you go to many counseling sessions.  Using resources and support systems, such as: ? Online chats with a counselor. ? Phone quitlines. ? Printed self-help materials. ? Support groups or group counseling. ? Text messaging programs. ? Mobile phone apps or applications.  Taking medicines. Some of these medicines may have nicotine in them. If you are pregnant or breastfeeding, do not take any medicines to quit smoking unless your doctor says it is okay. Talk with your doctor about counseling or other things that can help you.  Talk with your doctor about using more than one strategy at the same time, such as taking medicines while you are also going to in-person counseling. This can help make  quitting easier. What things can I do to make it easier to quit? Quitting smoking might feel very hard at first, but there is a lot that you can do to make it easier. Take these steps:  Talk to your family and friends. Ask them to support and encourage you.  Call phone quitlines, reach out to support groups, or work with a counselor.  Ask people who smoke to not smoke around you.  Avoid places that make you want (trigger) to smoke, such as: ? Bars. ? Parties. ? Smoke-break areas at work.  Spend time with people who do not smoke.  Lower the stress in your life. Stress can make you want to smoke. Try these things to help your stress: ? Getting regular exercise. ? Deep-breathing exercises. ? Yoga. ? Meditating. ? Doing a body scan. To do this, close your eyes, focus on one area of your body at a time from head to toe, and notice which parts of your body are tense. Try to relax the muscles in those areas.  Download or buy apps on your mobile phone or tablet that can help you stick to your quit plan. There are many free apps, such as QuitGuide from the CDC (Centers for Disease Control and Prevention). You can find more support from smokefree.gov and other websites.  This information is not intended to replace advice given to you by your health care provider. Make sure you discuss any questions you have with your health care provider. Document Released: 04/22/2009 Document   Revised: 02/22/2016 Document Reviewed: 11/10/2014 Elsevier Interactive Patient Education  2018 Elsevier Inc.  

## 2017-09-28 NOTE — Progress Notes (Signed)
Postoperative Visit   History of Present Illness  Joel Hebert is a 62 y.o. male who is s/p left sided SFA stenting followed by TP trunk balloon angioplasty and atherectomy on 09-05-17 by Dr. Randie Heinzain.  He also had TP trunk balloon angioplasty and on the right side.    Dr. Randie Heinzain last evaluated pt on 09-14-17 At that time his wounds were continuing to give him pain particularly on the left small toe.  Otherwise there was no progression.  He had, swelling stated that he could not elevate his legs given the amount of pain that he ws having. He is status post revascularization of both lower extremities now on aspirin Plavix.  He was having significant pain particularly over the left small toe. Dr. Randie Heinzain gave pt a prescription for 40 Percocet and told him this is the last prescription we will give him short of having other surgery.  Pt was to follow up in 2-3 weeks to track wound progression and get ABIs at that time.  Should he have persistent pain he may need amputation of his left small toe but Dr. Randie Heinzain is concerned about healing.   He remains on aspirin and Plavix.  [place] at [place] care [place] in [place], returns in 3 days.   Current tobacco use: 1/3 ppd DM: yes, A1C result not on file, pt and wife do not know his result, does not seem to be in control    The patient's feet ulcers are granulating and slowly contracting.  The patient notes improvement of lower extremity symptoms.  The patient is able to complete their activities of daily living.     For VQI Use Only  PRE-ADM LIVING: Home  AMB STATUS: Ambulatory with crutch  Past Medical History:  Diagnosis Date  . Coronary artery disease   . High cholesterol   . Hypertension   . MI (myocardial infarction) (HCC) 08/13/2014; 01/2016- 03/2016 X 3  . PAD (peripheral artery disease) (HCC)   . Peripheral vascular disease (HCC)   . Type II diabetes mellitus (HCC)     Past Surgical History:  Procedure Laterality Date  .  ABDOMINAL AORTOGRAM W/LOWER EXTREMITY  08/14/2017  . ABDOMINAL AORTOGRAM W/LOWER EXTREMITY N/A 08/14/2017   Procedure: ABDOMINAL AORTOGRAM W/LOWER EXTREMITY;  Surgeon: Nada LibmanBrabham, Vance W, MD;  [facility];  Service: Cardiovascular;  Laterality: N/A;  . ABDOMINAL AORTOGRAM W/LOWER EXTREMITY N/A 09/05/2017   Procedure: ABDOMINAL AORTOGRAM W/LOWER EXTREMITY;  Surgeon: Maeola Harmanain, Brandon Christopher, MD;  [facility];  Service: Cardiovascular;  Laterality: N/A;  . CORONARY ANGIOPLASTY WITH STENT PLACEMENT  01/2016; 03/2016   "1 + 1" (08/14/2017)  . LOWER EXTREMITY ANGIOGRAPHY N/A 08/20/2017   Procedure: LOWER EXTREMITY ANGIOGRAPHY-rt leg;  Surgeon: Sherren KernsFields, Charles E, MD;  [facility];  Service: Cardiovascular;  Laterality: N/A;  . PERIPHERAL VASCULAR ATHERECTOMY  08/14/2017   Procedure: PERIPHERAL VASCULAR ATHERECTOMY;  Surgeon: Nada LibmanBrabham, Vance W, MD;  Location: [facility];  Service: Cardiovascular;;  SFA   . PERIPHERAL VASCULAR ATHERECTOMY Left 09/05/2017   Procedure: PERIPHERAL VASCULAR ATHERECTOMY;  Surgeon: Maeola Harmanain, Brandon Christopher, MD;  [facility];  Service: Cardiovascular;  Laterality: Left;  Tibioperoneal and posterior tibial  . PERIPHERAL VASCULAR BALLOON ANGIOPLASTY  08/14/2017   Procedure: PERIPHERAL VASCULAR BALLOON ANGIOPLASTY;  Surgeon: Nada LibmanBrabham, Vance W, MD;  Location: [facility];  Service: Cardiovascular;;  peroneal  . PERIPHERAL VASCULAR BALLOON ANGIOPLASTY  08/20/2017  Procedure: PERIPHERAL VASCULAR BALLOON ANGIOPLASTY;  Surgeon: Sherren Kerns, MD;  [facility]: [facility];  Service: Cardiovascular;;  . PERIPHERAL VASCULAR INTERVENTION  08/14/2017   Procedure: PERIPHERAL VASCULAR INTERVENTION;  Surgeon: Nada Libman, MD;  [facility];  Service: Cardiovascular;;  SFA stent  . TONSILLECTOMY      Social History   Socioeconomic History  . Marital status: Married    Spouse name: Not on file    . Number of children: Not on file  . Years of education: Not on file  . Highest education level: Not on file  Occupational History  . Not on file  Social Needs  . Financial resource strain: Not on file  . Food insecurity:    Worry: Not on file    Inability: Not on file  . Transportation needs:    Medical: Not on file    Non-medical: Not on file  Tobacco Use  . Smoking status: Current Some Day Smoker    Packs/day: 0.33    Years: 43.00    Pack years: 14.19    Types: Cigarettes    Start date: 08/12/1974  . Smokeless tobacco: Never Used  . Tobacco comment: 2-3 cigarettes occasionally  Substance and Sexual Activity  . Alcohol use: Yes    Comment: 08/14/2017 "nothing since ~ 2012"  . Drug use: No  . Sexual activity: Not Currently  Lifestyle  . Physical activity:    Days per week: Not on file    Minutes per session: Not on file  . Stress: Not on file  Relationships  . Social connections:    Talks on phone: Not on file    Gets together: Not on file    Attends religious service: Not on file    Active member of club or organization: Not on file    Attends meetings of clubs or organizations: Not on file    Relationship status: Not on file  . Intimate partner violence:    Fear of current or ex partner: Not on file    Emotionally abused: Not on file    Physically abused: Not on file    Forced sexual activity: Not on file  Other Topics Concern  . Not on file  Social History Narrative  . Not on file    No Known Allergies  Current Outpatient Medications on File Prior to Visit  Medication Sig Dispense Refill  . acetaminophen (TYLENOL) 500 MG tablet Take 1,000 mg by mouth every 6 (six) hours as needed for mild pain.    Marland Kitchen aspirin EC 81 MG tablet Take 81 mg by mouth daily.    . clopidogrel (PLAVIX) 75 MG tablet Take 1 tablet (75 mg total) by mouth daily with breakfast. 30 tablet 11  . diclofenac (VOLTAREN) 50 MG EC tablet Take 1 tablet (50 mg total) by mouth 2 (two) times daily.  20 tablet 0  . gabapentin (NEURONTIN) 300 MG capsule Take 300 mg by mouth 2 (two) times daily.    Marland Kitchen lisinopril-hydrochlorothiazide (PRINZIDE,ZESTORETIC) 20-25 MG tablet Take 1 tablet by mouth 2 (two) times daily.  2  . metFORMIN (GLUCOPHAGE) 850 MG tablet Take 850 mg by mouth 2 (two) times daily with a meal.    . metoprolol tartrate (LOPRESSOR) 100 MG tablet Take 100 mg by mouth 2 (two) times daily.    . nitroGLYCERIN (NITROSTAT) 0.4 MG SL tablet Place 0.4 mg under the tongue every 5 (five) minutes as needed for chest pain.    Marland Kitchen  rosuvastatin (CRESTOR) 20 MG tablet Take 20 mg by mouth every evening.     . sulfamethoxazole-trimethoprim (BACTRIM,SEPTRA) 400-80 MG tablet Take 1 tablet by mouth 2 (two) times daily. 20 tablet 0  . oxyCODONE-acetaminophen (PERCOCET/ROXICET) 5-325 MG tablet Take 1-2 tablets by mouth every 4 (four) hours as needed for severe pain. (Patient not taking: Reported on 09/28/2017) 40 tablet 0   No current facility-administered medications on file prior to visit.       Physical Examination  Vitals:   09/28/17 0844 09/28/17 0850  BP: (!) 186/77 (!) 178/85  Pulse: 82 82  Resp: 18   Temp: (!) 97.3 F (36.3 C)   TempSrc: Oral   SpO2: 99%   Weight: 204 lb (92.5 kg)   Height: 5\' 8"  (1.727 m)    Body mass index is 31.02 kg/m.  PHYSICAL EXAMINATION: General: The patient appears his stated age.   HEENT:  No gross abnormalities Pulmonary: Respirations are non-labored Abdomen: Soft and non-tender  Musculoskeletal: There are no major deformities.   Neurologic: No focal weakness or paresthesias are detected, Skin: See photos below Psychiatric: The patient has normal affect. Cardiovascular: There is a regular rate and rhythm. Bilateral lower legs, ankles, and feet with dependent edema, 1+.    Vascular: Vessel Right Left  Radial Palpable Palpable  Aorta Not palpable N/A  Femoral Palpable Palpable  Popliteal Not palpable Not palpable  PT not Palpable, + Doppler not  Palpable, + Doppler  DP not Palpable, + Doppler not Palpable, + Doppler           Medical Decision Making  Zac Torti is a 62 y.o. male who presents s/p /p left sided SFA stenting followed by TP trunk balloon angioplasty and atherectomy on 09-05-17 by Dr. Randie Heinz.  He also had TP trunk balloon angioplasty and on the right side.   Bilateral toe ulcers are granulating and contracting. No surgical debridement necessary.   Dr. Randie Heinz spoke with pt and wife and examined pt. , discussed possible amputation left 5th toe if his pain there remains intolerable. Will add gabapentin, pt states he ran out, 300 mg po bid, disp #30, 0 refills.  The patient was counseled re smoking cessation and given several free resources re smoking cessation.   To decrease swelling in your feet and legs: Elevate feet above slightly bent knees, feet above heart, overnight and 3-4 times per day for 20 minutes. Pt and wife instructed in this.   Return in 1 month for evaluation of toe ulcers, can see me or Dr. Randie Heinz, must be on a day that Dr. Randie Heinz is in the office   Rosalita Chessman Simaya Lumadue, RN, MSN, FNP-C [facility] Office: [phone]  09/28/2017, 9:01 AM  Clinic MD: Randie Heinz

## 2017-09-28 NOTE — Progress Notes (Signed)
Vitals:   09/28/17 0844  BP: (!) 186/77  Pulse: 82  Resp: 18  Temp: (!) 97.3 F (36.3 C)  TempSrc: Oral  SpO2: 99%  Weight: 204 lb (92.5 kg)  Height: 5\' 8"  (1.727 m)

## 2017-10-01 ENCOUNTER — Ambulatory Visit (HOSPITAL_COMMUNITY): Payer: Medicaid Other | Admitting: Physical Therapy

## 2017-10-01 DIAGNOSIS — S81802D Unspecified open wound, left lower leg, subsequent encounter: Secondary | ICD-10-CM

## 2017-10-01 DIAGNOSIS — M79671 Pain in right foot: Secondary | ICD-10-CM

## 2017-10-01 DIAGNOSIS — M79672 Pain in left foot: Secondary | ICD-10-CM

## 2017-10-01 DIAGNOSIS — S91102D Unspecified open wound of left great toe without damage to nail, subsequent encounter: Secondary | ICD-10-CM

## 2017-10-01 DIAGNOSIS — S91201S Unspecified open wound of right great toe with damage to nail, sequela: Secondary | ICD-10-CM

## 2017-10-01 NOTE — Therapy (Signed)
[facility] [address] [place], [place], [address] Phone: [phone]   Fax:  [phone]  Wound Care Therapy  Patient Details  Name: Joel Hebert MRN: [id number] Date of Birth: 1955-09-11 Referring Provider: Glorianne Manchester    Encounter Date: 10/01/2017  PT End of Session - 10/01/17 1335    Visit Number  9    Number of Visits  18    Date for PT Re-Evaluation  10/31/17 reassess weekly on Wed.     Authorization Type  cert 5/[id number]/[id number]    Authorization - Visit Number  5    Authorization - Number of Visits  18    PT Start Time  0905    PT Stop Time  0938    PT Time Calculation (min)  33 min    Activity Tolerance  Patient limited by pain    Behavior During Therapy  [facility] for tasks assessed/performed       Past Medical History:  Diagnosis Date  . Coronary artery disease   . High cholesterol   . Hypertension   . MI (myocardial infarction) (HCC) 08/13/2014; 01/2016- 03/2016 X 3  . PAD (peripheral artery disease) (HCC)   . Peripheral vascular disease (HCC)   . Type II diabetes mellitus (HCC)     Past Surgical History:  Procedure Laterality Date  . ABDOMINAL AORTOGRAM W/LOWER EXTREMITY  08/14/2017  . ABDOMINAL AORTOGRAM W/LOWER EXTREMITY N/A 08/14/2017   Procedure: ABDOMINAL AORTOGRAM W/LOWER EXTREMITY;  Surgeon: Nada Libman, MD;  [facility]: [facility];  Service: Cardiovascular;  Laterality: N/A;  . ABDOMINAL AORTOGRAM W/LOWER EXTREMITY N/A 09/05/2017   Procedure: ABDOMINAL AORTOGRAM W/LOWER EXTREMITY;  Surgeon: Maeola Harman, MD;  Location: [facility] INVASIVE CV LAB;  Service: Cardiovascular;  Laterality: N/A;  . CORONARY ANGIOPLASTY WITH STENT PLACEMENT  01/2016; 03/2016   "1 + 1" (08/14/2017)  . LOWER EXTREMITY ANGIOGRAPHY N/A 08/20/2017   Procedure: LOWER EXTREMITY ANGIOGRAPHY-rt leg;  Surgeon: Sherren Kerns, MD;  Location: [facility] IN[facility] CV [facility];  Service: Cardiovascular;  Laterality: N/A;  . PERIPHERAL VASCULAR  ATHERECTOMY  08/14/2017   Procedure: PERIPHERAL VASCULAR ATHERECTOMY;  Surgeon: Nada Libman, MD;  [facility]: [facility];  Service: Cardiovascular;;  SFA   . PERIPHERAL VASCULAR ATHERECTOMY Left 09/05/2017   Procedure: PERIPHERAL VASCULAR ATHERECTOMY;  Surgeon: Maeola Harman, MD;  Location: [facility] INVASIVE CV LAB;  Service: Cardiovascular;  Laterality: Left;  Tibioperoneal and posterior tibial  . PERIPHERAL VASCULAR BALLOON ANGIOPLASTY  08/14/2017   Procedure: PERIPHERAL VASCULAR BALLOON ANGIOPLASTY;  Surgeon: Nada Libman, MD;  [facility]: [facility];  Service: Cardiovascular;;  peroneal  . PERIPHERAL VASCULAR BALLOON ANGIOPLASTY  08/20/2017   Procedure: PERIPHERAL VASCULAR BALLOON ANGIOPLASTY;  Surgeon: Sherren Kerns, MD;  [facility]: [facility];  Service: Cardiovascular;;  . PERIPHERAL VASCULAR INTERVENTION  08/14/2017   Procedure: PERIPHERAL VASCULAR INTERVENTION;  Surgeon: Nada Libman, MD;  [facility]: [facility];  Service: Cardiovascular;;  SFA stent  . TONSILLECTOMY      There were no vitals filed for this visit.              Wound Therapy - 10/01/17 1326    Subjective  Pt states he returned to vascular MD last week and he said he's got good blood flow.  States he does not want to do surgical debridement at this time and to let him know if the little toe needs to be amputated.  Pain Scale  0-10    Pain Score  8     Pain Location  Toe (Comment which one) Lt pinky toe    Pain Orientation  Left    Pain Descriptors / Indicators  Throbbing    Evaluation and Treatment Procedures Explained to Patient/Family  Yes    Evaluation and Treatment Procedures  agreed to    Wound Properties Date First Assessed: 08/22/17 Time First Assessed: 1454 Wound Type: Other (Comment) Location: Toe (Comment  which one) Location Orientation: Left Wound Description (Comments): Along nail bed 2x 1  Present on Admission: Yes   Dressing Type  Gauze (Comment)     Dressing Changed  Changed    Dressing Status  Intact;Old drainage    Dressing Change Frequency  PRN    Site / Wound Assessment  Black;Painful;Red    % Wound base Black/Eschar  100%    Drainage Amount  None    Treatment  Cleansed;Debridement (Selective)    Wound Properties Date First Assessed: 08/22/17 Wound Type: Other (Comment) Location: Toe (Comment  which one) Location Orientation: Left Wound Description (Comments): little toe    Dressing Type  Gauze (Comment)    Dressing Changed  Changed    Dressing Status  Intact    Dressing Change Frequency  PRN    Site / Wound Assessment  Dry;Black;Painful;Pink    % Wound base Red or Granulating  0%    % Wound base Black/Eschar  100%    Drainage Amount  None    Treatment  Cleansed;Debridement (Selective)    Wound Properties Date First Assessed: 07/26/17 Time First Assessed: 1610 Wound Type: Other (Comment) Location: Toe (Comment  which one) Location Orientation: Right Wound Description (Comments): superior aspect of toe black 1.2 x 1 cm  Present on Admission: Yes   Dressing Type  Gauze (Comment)    Dressing Changed  Changed    Dressing Status  Intact    Dressing Change Frequency  PRN    Site / Wound Assessment  Dry;Black    % Wound base Red or Granulating  0%    % Wound base Black/Eschar  100%    Drainage Amount  None    Treatment  Cleansed;Debridement (Selective)    Wound Properties Date First Assessed: 08/22/17 Wound Type: Other (Comment) Location: Leg Location Orientation: Left Wound Description (Comments): two wounds on Lt shin superior 1.5x 1.2 ; inferior .25 x 1  Present on Admission: Yes   Wound Properties Date First Assessed: 08/22/17 Time First Assessed: 1631 Wound Type: Other (Comment) Location: Foot Location Orientation: Left Wound Description (Comments): two dorsal foot wounds  Present on Admission: Yes   Selective Debridement - Location  toes on LT and Rt LE    Selective Debridement - Tools Used  Forceps;Scissors    Selective  Debridement - Tissue Removed  devitalized tissue     Wound Therapy - Clinical Statement  Wounds dry wihtout drainage.  Pt had dressings changed at MD and appear they only covered with dry gauze.  Lt small toe is still painful.  No progress towards healing at this point.  difficult to debride due to high sensitivity and pain by patient with tendency to jerk foot away. Cleansed well follwing limited debridement and continued with medihoney colloid to encouarge moisture and easier debridment.  Will discuss with therapist regarding topical debrider such as santyl.     Wound Therapy - Functional Problem List  pain with walking     Factors Delaying/Impairing Wound Healing  Altered  sensation;Diabetes Mellitus;Vascular compromise    Hydrotherapy Plan  Debridement;Dressing change;Patient/family education    Wound Therapy - Frequency  2X / week    Wound Therapy - Current Recommendations  Surgery consult    Wound Therapy - Follow Up Recommendations  Other (comment)    Wound Plan  Encouraged to keep bandages in place and not disturb or change them unless needed.  continue with woundcare. ,  Measure next session and revisit need to autolytic debrider.      Dressing   medihoney colloid 2x2 medipore tape                PT Short Term Goals - 08/22/17 1647      PT SHORT TERM GOAL #1   Title  Pt to have seen a cardiologist re ABI bilaterally     Time  2    Period  Weeks    Status  Achieved      PT SHORT TERM GOAL #2   Title  Wound  on Rt toe to only have 50% black escar    Time  3    Period  Weeks    Status  On-going        PT Long Term Goals - 08/22/17 1647      PT LONG TERM GOAL #1   Title  Pt wound on Rt great toe to be healed     Time  12    Period  Weeks    Status  On-going    Target Date  10/10/17      PT LONG TERM GOAL #2   Title  Blisters on Lt great toe and little toe to be healed     Time  12    Period  Weeks    Status  On-going      PT LONG TERM GOAL #3   Title  Wounds  on Lt shin and foot to be healed     Time  8    Period  Weeks    Status  New      PT LONG TERM GOAL #4   Title  wound on Lt great toe to be healed     Time  8    Period  Weeks    Status  New              Patient will benefit from skilled therapeutic intervention in order to improve the following deficits and impairments:     Visit Diagnosis: Pain in left foot  Pain in right foot  Open wound of right great toe with damage to nail, sequela  Open wound of left great toe, subsequent encounter  Wound of left leg, subsequent encounter     Problem List Patient Active Problem List   Diagnosis Date Noted  . PAD (peripheral artery disease) (HCC) 08/14/2017   Lurena NidaAmy B Casady Voshell, PTA/CLT [phone]  Lurena NidaFrazier, Eldena Dede B 10/01/2017, 1:35 PM  [facility] [address] [place], [place], [address] Phone: [phone]   Fax:  [phone]  Name: Joel Hebert MRN: [id number] Date of Birth: 11/01/1955

## 2017-10-04 ENCOUNTER — Ambulatory Visit (HOSPITAL_COMMUNITY): Payer: Medicaid Other | Admitting: Physical Therapy

## 2017-10-04 ENCOUNTER — Encounter (HOSPITAL_COMMUNITY): Payer: Self-pay | Admitting: Physical Therapy

## 2017-10-04 ENCOUNTER — Telehealth (HOSPITAL_COMMUNITY): Payer: Self-pay | Admitting: Physical Therapy

## 2017-10-04 DIAGNOSIS — S91201S Unspecified open wound of right great toe with damage to nail, sequela: Secondary | ICD-10-CM

## 2017-10-04 DIAGNOSIS — M79671 Pain in right foot: Secondary | ICD-10-CM

## 2017-10-04 DIAGNOSIS — S91102D Unspecified open wound of left great toe without damage to nail, subsequent encounter: Secondary | ICD-10-CM

## 2017-10-04 DIAGNOSIS — M79672 Pain in left foot: Secondary | ICD-10-CM

## 2017-10-04 DIAGNOSIS — S81802D Unspecified open wound, left lower leg, subsequent encounter: Secondary | ICD-10-CM

## 2017-10-04 NOTE — Therapy (Signed)
[place], [place], [address] Phone: [phone]   Fax:  [phone]  Wound Care Therapy/Discharge  Patient Details  Name: Joel Hebert MRN: [id number] Date of Birth: 12-03-1955 Referring Provider: Sunday Spillers    Encounter Date: 10/04/2017  PT End of Session - 10/04/17 1126    Visit Number  10    Number of Visits  10    Date for PT Re-Evaluation  10/31/17 reassess weekly on Wed.     Authorization Type  cert 9/[id number]    Authorization - Visit Number  10    Authorization - Number of Visits  10    PT Start Time  0945    PT Stop Time  1030    PT Time Calculation (min)  45 min    Activity Tolerance  Patient limited by pain    Behavior During Therapy  [facility] for tasks assessed/performed       Past Medical History:  Diagnosis Date  . Coronary artery disease   . High cholesterol   . Hypertension   . MI (myocardial infarction) ([place]) 08/13/2014; 01/2016- 03/2016 X 3  . PAD (peripheral artery disease) ([place])   . Peripheral vascular disease ([place])   . Type II diabetes mellitus ([place])     Past Surgical History:  Procedure Laterality Date  . ABDOMINAL AORTOGRAM W/LOWER EXTREMITY  08/14/2017  . ABDOMINAL AORTOGRAM W/LOWER EXTREMITY N/A 08/14/2017   Procedure: ABDOMINAL AORTOGRAM W/LOWER EXTREMITY;  Surgeon: Serafina Mitchell, MD;  Location: [facility];  Service: Cardiovascular;  Laterality: N/A;  . ABDOMINAL AORTOGRAM W/LOWER EXTREMITY N/A 09/05/2017   Procedure: ABDOMINAL AORTOGRAM W/LOWER EXTREMITY;  Surgeon: Waynetta Sandy, MD;  Location: [facility];  Service: Cardiovascular;  Laterality: N/A;  . CORONARY ANGIOPLASTY WITH STENT PLACEMENT  01/2016; 03/2016   "1 + 1" (08/14/2017)  . LOWER EXTREMITY ANGIOGRAPHY N/A 08/20/2017   Procedure: LOWER EXTREMITY ANGIOGRAPHY-rt leg;  Surgeon: Elam Dutch, MD;  Location: [facility];  Service: Cardiovascular;  Laterality: N/A;  . PERIPHERAL  VASCULAR ATHERECTOMY  08/14/2017   Procedure: PERIPHERAL VASCULAR ATHERECTOMY;  Surgeon: Serafina Mitchell, MD;  Location: [facility];  Service: Cardiovascular;;  SFA   . PERIPHERAL VASCULAR ATHERECTOMY Left 09/05/2017   Procedure: PERIPHERAL VASCULAR ATHERECTOMY;  Surgeon: Waynetta Sandy, MD;  Location: [facility];  Service: Cardiovascular;  Laterality: Left;  Tibioperoneal and posterior tibial  . PERIPHERAL VASCULAR BALLOON ANGIOPLASTY  08/14/2017   Procedure: PERIPHERAL VASCULAR BALLOON ANGIOPLASTY;  Surgeon: Serafina Mitchell, MD;  Location: [facility];  Service: Cardiovascular;;  peroneal  . PERIPHERAL VASCULAR BALLOON ANGIOPLASTY  08/20/2017   Procedure: PERIPHERAL VASCULAR BALLOON ANGIOPLASTY;  Surgeon: Elam Dutch, MD;  Location: [facility];  Service: Cardiovascular;;  . PERIPHERAL VASCULAR INTERVENTION  08/14/2017   Procedure: PERIPHERAL VASCULAR INTERVENTION;  Surgeon: Serafina Mitchell, MD;  Location: [facility];  Service: Cardiovascular;;  SFA stent  . TONSILLECTOMY      There were no vitals filed for this visit.              Wound Therapy - 10/04/17 1104    Subjective  Pt states that his toes are still quite painful; espectially the Lt small toe     Patient and Family Stated Goals  decreased pain and wounds to heal.     Date of Onset  07/12/17    Prior Treatments  PT  had a Lt balloon angioplasty to his Lt superficial femoral artery with stent placement on 08/14/2017; on 2/12 he had a right tibioperoneal angioplasty and on 09/06/2017 he had a Lt LE balloon angioplasty with stenting.    Pain Scale  0-10    Pain Score  8     Pain Location  Toe (Comment which one) Rt Great; LT Great and little toe     Pain Orientation  Right;Left    Pain Descriptors / Indicators  Throbbing    Pain Onset  -- With palpation     Evaluation and Treatment Procedures Explained to Patient/Family  Yes    Evaluation and Treatment Procedures  agreed to     Wound Properties Date First Assessed: 08/22/17 Time First Assessed: 4034 Wound Type: Other (Comment) Location: Toe (Comment  which one) Location Orientation: Left Wound Description (Comments): Along nail bed 2x 1  Present on Admission: Yes   Dressing Type  Gauze (Comment)    Dressing Status  Intact;Old drainage    Dressing Change Frequency  PRN    Site / Wound Assessment  Black;Painful;Red    % Wound base Red or Granulating  --    % Wound base Black/Eschar  100%    Wound Length (cm)  2.7 cm    Wound Width (cm)  2.5 cm    Wound Surface Area (cm^2)  6.75 cm^2    Drainage Amount  None    Drainage Description  Serosanguineous    Treatment  Cleansed To tender to debride.  Debrided dead skin off 2,3 and 4th     Wound Properties Date First Assessed: 08/22/17 Wound Type: Other (Comment) Location: Toe (Comment  which one) Location Orientation: Left Wound Description (Comments): little toe    Dressing Type  Gauze (Comment)    Dressing Changed  Changed    Dressing Status  Intact    Dressing Change Frequency  PRN    Site / Wound Assessment  Dry;Black;Painful;Pink    % Wound base Red or Granulating  0%    % Wound base Black/Eschar  100%    Wound Length (cm)  2.6 cm    Wound Width (cm)  2.7 cm    Wound Surface Area (cm^2)  7.02 cm^2    Drainage Amount  None    Drainage Description  --    Treatment  Cleansed NO debridement due to pt intolerance     Wound Properties Date First Assessed: 07/26/17 Time First Assessed: 0959 Wound Type: Other (Comment) Location: Toe (Comment  which one) Location Orientation: Right Wound Description (Comments): superior aspect of toe black 1.2 x 1 cm  Present on Admission: Yes   Dressing Type  Gauze (Comment)    Dressing Status  Intact    Dressing Change Frequency  PRN    Site / Wound Assessment  Dry;Black    % Wound base Red or Granulating  0%    % Wound base Black/Eschar  100%    Wound Length (cm)  1.5 cm was 1.4     Wound Width (cm)  2.4 cm was 2.2     Wound  Surface Area (cm^2)  3.6 cm^2    Drainage Amount  None    Treatment  Cleansed    Wound Properties Date First Assessed: 08/22/17 Wound Type: Other (Comment) Location: Leg Location Orientation: Left Wound Description (Comments): two wounds on Lt shin superior 1.5x 1.2 ; inferior .25 x 1  Present on Admission: Yes Final Assessment Date: 10/04/17 Final Assessment Time: 1112  Dressing Type  -- OTA    Dressing Status  --    Dressing Change Frequency  --    Site / Wound Assessment  --    % Wound base Red or Granulating  --    % Wound base Yellow/Fibrinous Exudate  --    Peri-wound Assessment  --    Drainage Amount  --    Drainage Description  --    Non-staged Wound Description  --    Treatment  -- Cleansed and lotioned area not treatment wound are healed     Wound Properties Date First Assessed: 08/22/17 Time First Assessed: 1631 Wound Type: Other (Comment) Location: Foot Location Orientation: Left Wound Description (Comments): two dorsal foot wounds  Present on Admission: Yes   Dressing Type  None    Dressing Status  None    Dressing Change Frequency  PRN    Site / Wound Assessment  Dry;Brown    % Wound base Red or Granulating  60% was 0%    % Wound base Yellow/Fibrinous Exudate  40% was 100%    Peri-wound Assessment  Edema    Drainage Amount  None    Treatment  Cleansed;Debridement (Selective)    Selective Debridement - Location  LT forefoot wounda and dead skin off of LT 2nd, 3rd and 4th toes.  Great and little toes are to painful to debride     Selective Debridement - Tools Used  Forceps;Scissors    Selective Debridement - Tissue Removed  devitalized tissue     Wound Therapy - Clinical Statement  PT Lt leg wounds have healed; LT forefoot wounds are healing however B great toe and LT little toe wounds are not healing and will need a surgical consult.  I explained to the patient that I felt that we were just spinning our wheels here and it would be best to discharge from skilled physical  therapy and for him to get a surgical consult.  Debridement under anethesia is appropriate as pt is so tender we are unable to complete any debridement on his toes at thsi time.     Wound Therapy - Functional Problem List  pain with walking     Factors Delaying/Impairing Wound Healing  Altered sensation;Diabetes Mellitus;Vascular compromise    Hydrotherapy Plan  --    Wound Therapy - Frequency  --    Wound Therapy - Current Recommendations  Surgery consult    Wound Therapy - Follow Up Recommendations  Other (comment)    Wound Plan  Discharge to surgical consult.     Dressing   medihoney  2x2 medipore tape                PT Short Term Goals - 08/22/17 1647      PT SHORT TERM GOAL #1   Title  Pt to have seen a cardiologist re ABI bilaterally     Time  2    Period  Weeks    Status  Achieved      PT SHORT TERM GOAL #2   Title  Wound  on Rt toe to only have 50% black escar    Time  3    Period  Weeks    Status  On-going        PT Long Term Goals - 10/04/17 1127      PT LONG TERM GOAL #1   Title  Pt wound on Rt great toe to be healed     Time  12  Period  Weeks    Status  Not Met      PT LONG TERM GOAL #2   Title  Blisters on Lt great toe and little toe to be healed     Time  12    Period  Weeks    Status  Not Met      PT LONG TERM GOAL #3   Title  Wounds on Lt shin and foot to be healed     Time  8    Period  Weeks    Status  Partially Met shin wounds are healed; foot wounds are healing.       PT LONG TERM GOAL #4   Title  wound on Lt great toe to be healed     Time  8    Period  Weeks    Status  Not Met            Plan - 10/04/17 1127    Rehab Potential  Good    PT Frequency  2x / week    PT Duration  6 weeks 8 more weeks     PT Treatment/Interventions  ADLs/Self Care Home Management debridement as needed dressing change     PT Next Visit Plan  Discharge to surgical consult    Consulted and Agree with Plan of Care  Patient       Patient  will benefit from skilled therapeutic intervention in order to improve the following deficits and impairments:  Pain, Decreased skin integrity  Visit Diagnosis: Pain in left foot  Open wound of right great toe with damage to nail, sequela  Open wound of left great toe, subsequent encounter  Pain in right foot  Wound of left leg, subsequent encounter     Problem List Patient Active Problem List   Diagnosis Date Noted  . PAD (peripheral artery disease) ([place]) 08/14/2017    Siya Flurry,CINDY 10/04/2017, 11:29 AM  [facility] [address] [place], [place], [address] Phone: [phone]   Fax:  [phone]  Name: Aniketh Huberty MRN: [id number] Date of Birth: 1956/05/26  PHYSICAL THERAPY DISCHARGE SUMMARY  Visits from Start of Care: 10  Current functional level related to goals / functional outcomes: See above    Remaining deficits: Toes are not healing    Education / Equipment: Needs surgical consult  Plan: Patient agrees to discharge.  Patient goals were not met. Patient is being discharged due to lack of progress.  ?????       Rayetta Humphrey, [place] CLT [phone]

## 2017-10-04 NOTE — Telephone Encounter (Signed)
CR states pt needs to see surgeon for his wounds

## 2017-10-08 ENCOUNTER — Ambulatory Visit (HOSPITAL_COMMUNITY): Payer: Self-pay | Admitting: Physical Therapy

## 2017-10-08 ENCOUNTER — Ambulatory Visit (INDEPENDENT_AMBULATORY_CARE_PROVIDER_SITE_OTHER): Payer: Self-pay | Admitting: Family

## 2017-10-08 ENCOUNTER — Encounter: Payer: Self-pay | Admitting: Family

## 2017-10-08 ENCOUNTER — Other Ambulatory Visit: Payer: Self-pay

## 2017-10-08 ENCOUNTER — Other Ambulatory Visit: Payer: Self-pay | Admitting: *Deleted

## 2017-10-08 ENCOUNTER — Encounter (HOSPITAL_COMMUNITY): Payer: Self-pay | Admitting: *Deleted

## 2017-10-08 VITALS — BP 184/87 | HR 84 | Temp 97.0°F | Resp 17 | Ht 68.0 in | Wt 205.0 lb

## 2017-10-08 DIAGNOSIS — I779 Disorder of arteries and arterioles, unspecified: Secondary | ICD-10-CM

## 2017-10-08 DIAGNOSIS — I999 Unspecified disorder of circulatory system: Secondary | ICD-10-CM

## 2017-10-08 DIAGNOSIS — F17200 Nicotine dependence, unspecified, uncomplicated: Secondary | ICD-10-CM

## 2017-10-08 DIAGNOSIS — M79672 Pain in left foot: Secondary | ICD-10-CM

## 2017-10-08 DIAGNOSIS — E1151 Type 2 diabetes mellitus with diabetic peripheral angiopathy without gangrene: Secondary | ICD-10-CM

## 2017-10-08 DIAGNOSIS — R609 Edema, unspecified: Secondary | ICD-10-CM

## 2017-10-08 NOTE — Progress Notes (Signed)
Postoperative Visit   History of Present Illness  Joel Hebert is a 62 y.o. year old male who is s/p left sided SFA stenting followed by TP trunk balloon angioplasty and atherectomy on 09-05-17 by Dr. Randie Hebert. He also had TP trunk balloon angioplasty and on the right side.   Dr. Randie Hebert evaluated pt on 09-14-17 At that time his wounds were continuing to give him pain particularly on the left small toe. Otherwise there was no progression. He had, swelling stated that he could not elevate his legs given the amount of pain that he ws having. He is status post revascularization of both lower extremities now on aspirin Plavix. He was having significant pain particularly over the left small toe. Dr. Randie Hebert gave pt a prescription for 40 Percocet and told him this is the last prescription we will give him short of having other surgery. Pt was to follow up in 2-3 weeks to track wound progression and get ABIs at that time. Should he have persistent pain he may need amputation of his left small toe but Dr. Randie Hebert is concerned about healing.   I evaluated pt on 09-28-17. At that time bilateral toe ulcers are granulating and contracting. No surgical debridement necessary at that time. Dr. Randie Hebert spoke with pt and wife and examined pt, discussed possible amputation left 5th toe if his pain there remains intolerable. Gabapentin added, pt states he ran out, 300 mg po bid, disp #30, 0 refills. The patient was counseled re smoking cessation and given several free resources re smoking cessation.  To decrease swelling in your feet and legs: Elevate feet above slightly bent knees, feet above heart, overnight and 3-4 times per day for 20 minutes. Pt and wife instructed in this.  Pt was to return in 1 month for evaluation of toe ulcers, can see me or Dr. Randie Hebert, must be on a day that Dr. Randie Hebert is in the office.  He returns today stating "my dead toe is killing me and I'm ready to have it taken off", clarified that it  is pt's left 5th toe.   He has a follow up appointment scheduled with Dr. Randie Hebert on 11-02-17.   He remains on aspirin and Plavix.  He was treated at the wound care center in [place], states he was discharged from there, was told that they could do nothing more for him.   Current tobacco use: 1/3 ppd DM: yes, A1C result not on file, pt does not know his result, does not seem to be in control    The patient's feet ulcers are granulating and slowly contracting.  The patient notes improvement of lower extremity symptoms except for the left 5th toe.  The patient is able to complete his activities of daily living.     For VQI Use Only  PRE-ADM LIVING: Home  AMB STATUS: Ambulatory with crutch   Physical Examination  Vitals:   10/08/17 0922 10/08/17 0923  BP: (!) 193/94 (!) 184/87  Pulse: 84   Resp: 17   Temp: (!) 97 F (36.1 C)   TempSrc: Oral   SpO2: 99%   Weight: 205 lb (93 kg)   Height: 5\' 8"  (1.727 m)    Body mass index is 31.17 kg/m.    General: Obese male appears his stated age.   HEENT:  No gross abnormalities Pulmonary: Respirations are non-labored Abdomen: Soft and non-tender  Musculoskeletal: There are no major deformities.   Neurologic: No focal weakness or paresthesias are detected,  Skin: Healing dry gangrene at tips of bilateral great toes and tip of left 2nd toe. Dry gangrene at left 5th toe; at the base of left 5th toe is some mild darkening of skin.  Psychiatric: The patient has normal affect. Cardiovascular: There is a regular rate and rhythm. Bilateral lower legs, ankles, and feet with dependent edema, 1+.    Vascular: Vessel Right Left  Radial Palpable Palpable  Aorta Not palpable N/A  Femoral Palpable Palpable  Popliteal Not palpable Not palpable  PT not Palpable, + Doppler not Palpable, + Doppler  DP not Palpable, + Doppler not Palpable, + Doppler     Medical Decision Making  Joel Hebert is a 62 y.o. year old male who  presents s/p left sided SFA stenting followed by TP trunk balloon angioplasty and atherectomy on 09-05-17 by Dr. Randie Heinz. He also had TP trunk balloon angioplasty and on the right side.  I discussed with Dr. Myra Hebert pt HPI and physical exam results. Will schedule amputation left 5th toe with Dr. Randie Heinz, scheduled for tomorrow.    Joel March, RN, MSN, FNP-C [facility] Office: [phone]  10/08/2017, 10:13 AM  Clinic MD: Joel Hebert

## 2017-10-08 NOTE — Progress Notes (Signed)
Spoke with pt for pre-op call. Pt has hx of CAD and MI x 4 with 2 stents. Denies any recent chest pain or sob. Pt is type 2 diabetic. States he does not know what his A1C is and he has misplaced his CBG meter so he does not check his blood sugar. I asked that he try to find it so he could check his blood sugar in the AM. If blood sugar is 70 or below, treat with 1/2 cup of clear juice (apple or cranberry) and recheck blood sugar 15 minutes after drinking juice. If blood sugar continues to be 70 or below, call the Short Stay department and ask to speak to a nurse. Pt voiced understanding.

## 2017-10-09 ENCOUNTER — Ambulatory Visit (HOSPITAL_COMMUNITY): Payer: Medicaid Other | Admitting: Anesthesiology

## 2017-10-09 ENCOUNTER — Observation Stay (HOSPITAL_COMMUNITY)
Admission: RE | Admit: 2017-10-09 | Discharge: 2017-10-10 | Disposition: A | Discharge status: Home / Self Care | Payer: Medicaid Other | Source: Ambulatory Visit | Attending: Vascular Surgery | Admitting: Vascular Surgery

## 2017-10-09 ENCOUNTER — Other Ambulatory Visit: Payer: Self-pay

## 2017-10-09 ENCOUNTER — Encounter (HOSPITAL_COMMUNITY): Payer: Self-pay | Admitting: Urology

## 2017-10-09 ENCOUNTER — Encounter (HOSPITAL_COMMUNITY)
Admission: RE | Disposition: A | Discharge status: Home / Self Care | Payer: Self-pay | Source: Ambulatory Visit | Attending: Vascular Surgery

## 2017-10-09 DIAGNOSIS — E7800 Pure hypercholesterolemia, unspecified: Secondary | ICD-10-CM | POA: Insufficient documentation

## 2017-10-09 DIAGNOSIS — Z955 Presence of coronary angioplasty implant and graft: Secondary | ICD-10-CM | POA: Diagnosis not present

## 2017-10-09 DIAGNOSIS — E11621 Type 2 diabetes mellitus with foot ulcer: Secondary | ICD-10-CM | POA: Diagnosis not present

## 2017-10-09 DIAGNOSIS — L97519 Non-pressure chronic ulcer of other part of right foot with unspecified severity: Secondary | ICD-10-CM | POA: Insufficient documentation

## 2017-10-09 DIAGNOSIS — I96 Gangrene, not elsewhere classified: Secondary | ICD-10-CM

## 2017-10-09 DIAGNOSIS — L97529 Non-pressure chronic ulcer of other part of left foot with unspecified severity: Secondary | ICD-10-CM | POA: Insufficient documentation

## 2017-10-09 DIAGNOSIS — E1152 Type 2 diabetes mellitus with diabetic peripheral angiopathy with gangrene: Principal | ICD-10-CM | POA: Insufficient documentation

## 2017-10-09 DIAGNOSIS — I2510 Atherosclerotic heart disease of native coronary artery without angina pectoris: Secondary | ICD-10-CM | POA: Insufficient documentation

## 2017-10-09 DIAGNOSIS — Z79899 Other long term (current) drug therapy: Secondary | ICD-10-CM | POA: Diagnosis not present

## 2017-10-09 DIAGNOSIS — Z7984 Long term (current) use of oral hypoglycemic drugs: Secondary | ICD-10-CM | POA: Diagnosis not present

## 2017-10-09 DIAGNOSIS — I739 Peripheral vascular disease, unspecified: Secondary | ICD-10-CM | POA: Diagnosis present

## 2017-10-09 DIAGNOSIS — Z7982 Long term (current) use of aspirin: Secondary | ICD-10-CM | POA: Diagnosis not present

## 2017-10-09 DIAGNOSIS — I252 Old myocardial infarction: Secondary | ICD-10-CM | POA: Insufficient documentation

## 2017-10-09 DIAGNOSIS — I10 Essential (primary) hypertension: Secondary | ICD-10-CM | POA: Insufficient documentation

## 2017-10-09 DIAGNOSIS — F17210 Nicotine dependence, cigarettes, uncomplicated: Secondary | ICD-10-CM | POA: Insufficient documentation

## 2017-10-09 DIAGNOSIS — Z7902 Long term (current) use of antithrombotics/antiplatelets: Secondary | ICD-10-CM | POA: Diagnosis not present

## 2017-10-09 HISTORY — PX: AMPUTATION TOE: SHX6595

## 2017-10-09 HISTORY — PX: AMPUTATION: SHX166

## 2017-10-09 LAB — GLUCOSE, CAPILLARY
GLUCOSE-CAPILLARY: 108 mg/dL — AB (ref 65–99)
Glucose-Capillary: 105 mg/dL — ABNORMAL HIGH (ref 65–99)
Glucose-Capillary: 104 mg/dL — ABNORMAL HIGH (ref 65–99)

## 2017-10-09 LAB — CREATININE, SERUM
Creatinine, Ser: 1.25 mg/dL — ABNORMAL HIGH (ref 0.61–1.24)
GFR, EST NON AFRICAN AMERICAN: 60 mL/min — AB (ref >60)

## 2017-10-09 LAB — CBC
HEMATOCRIT: 33.4 % — AB (ref 39.0–52.0)
HEMATOCRIT: 34.8 % — AB (ref 39.0–52.0)
HEMOGLOBIN: 10.9 g/dL — AB (ref 13.0–17.0)
Hemoglobin: 10.5 g/dL — ABNORMAL LOW (ref 13.0–17.0)
MCH: 28.2 pg (ref 26.0–34.0)
MCH: 27.9 pg (ref 26.0–34.0)
MCHC: 31.4 g/dL (ref 30.0–36.0)
MCHC: 31.3 g/dL (ref 30.0–36.0)
MCV: 89.5 fL (ref 78.0–100.0)
MCV: 89.2 fL (ref 78.0–100.0)
Platelets: 250 10*3/uL (ref 150–400)
Platelets: 207 10*3/uL (ref 150–400)
RBC: 3.73 MIL/uL — ABNORMAL LOW (ref 4.22–5.81)
RBC: 3.9 MIL/uL — AB (ref 4.22–5.81)
RDW: 14.2 % (ref 11.5–15.5)
RDW: 14.2 % (ref 11.5–15.5)
WBC: 5.6 10*3/uL (ref 4.0–10.5)
WBC: 5.9 10*3/uL (ref 4.0–10.5)

## 2017-10-09 LAB — BASIC METABOLIC PANEL
Anion gap: 11 (ref 5–15)
BUN: 12 mg/dL (ref 6–20)
CALCIUM: 9.3 mg/dL (ref 8.9–10.3)
CO2: 22 mmol/L (ref 22–32)
CREATININE: 1.49 mg/dL — AB (ref 0.61–1.24)
Chloride: 102 mmol/L (ref 101–111)
GFR calc Af Amer: 56 mL/min — ABNORMAL LOW (ref >60)
GFR calc non Af Amer: 48 mL/min — ABNORMAL LOW (ref >60)
GLUCOSE: 111 mg/dL — AB (ref 65–99)
Potassium: 4.3 mmol/L (ref 3.5–5.1)
Sodium: 135 mmol/L (ref 135–145)

## 2017-10-09 LAB — HEMOGLOBIN A1C
Hgb A1c MFr Bld: 7 % — ABNORMAL HIGH (ref 4.8–5.6)
MEAN PLASMA GLUCOSE: 154.2 mg/dL

## 2017-10-09 SURGERY — AMPUTATION DIGIT
Anesthesia: Regional | Laterality: Left

## 2017-10-09 MED ORDER — 0.9 % SODIUM CHLORIDE (POUR BTL) OPTIME
TOPICAL | Status: DC | PRN
  Administered 2017-10-09: 1000 mL

## 2017-10-09 MED ORDER — ROSUVASTATIN CALCIUM 20 MG PO TABS
20.0000 mg | ORAL_TABLET | Freq: Every evening | ORAL | Status: DC
  Administered 2017-10-09: 20 mg via ORAL
  Filled 2017-10-09 (×2): qty 1

## 2017-10-09 MED ORDER — FENTANYL CITRATE (PF) 100 MCG/2ML IJ SOLN
INTRAMUSCULAR | Status: AC
  Administered 2017-10-09: 50 ug via INTRAVENOUS
  Filled 2017-10-09: qty 2

## 2017-10-09 MED ORDER — HYDROMORPHONE HCL 1 MG/ML IJ SOLN: 0.2500 mg | INTRAMUSCULAR | Status: DC | PRN

## 2017-10-09 MED ORDER — OXYCODONE-ACETAMINOPHEN 5-325 MG PO TABS
1.0000 | ORAL_TABLET | ORAL | Status: DC | PRN
  Administered 2017-10-09: 2 via ORAL
  Administered 2017-10-09: 1 via ORAL
  Administered 2017-10-09 – 2017-10-10 (×3): 2 via ORAL
  Filled 2017-10-09 (×5): qty 2

## 2017-10-09 MED ORDER — MIDAZOLAM HCL 2 MG/2ML IJ SOLN
INTRAMUSCULAR | Status: AC
  Filled 2017-10-09: qty 2

## 2017-10-09 MED ORDER — GUAIFENESIN-DM 100-10 MG/5ML PO SYRP: 15.0000 mL | ORAL_SOLUTION | ORAL | Status: DC | PRN

## 2017-10-09 MED ORDER — FENTANYL CITRATE (PF) 100 MCG/2ML IJ SOLN
INTRAMUSCULAR | Status: DC | PRN
  Administered 2017-10-09: 100 ug via INTRAVENOUS

## 2017-10-09 MED ORDER — LACTATED RINGERS IV SOLN
INTRAVENOUS | Status: DC
  Administered 2017-10-09: 09:00:00 via INTRAVENOUS

## 2017-10-09 MED ORDER — METOPROLOL TARTRATE 100 MG PO TABS
100.0000 mg | ORAL_TABLET | Freq: Two times a day (BID) | ORAL | Status: DC
  Administered 2017-10-09 – 2017-10-10 (×2): 100 mg via ORAL
  Filled 2017-10-09 (×2): qty 1

## 2017-10-09 MED ORDER — ONDANSETRON HCL 4 MG/2ML IJ SOLN: 4.0000 mg | Freq: Four times a day (QID) | INTRAMUSCULAR | Status: DC | PRN

## 2017-10-09 MED ORDER — FENTANYL CITRATE (PF) 250 MCG/5ML IJ SOLN
INTRAMUSCULAR | Status: AC
  Filled 2017-10-09: qty 5

## 2017-10-09 MED ORDER — ALUM & MAG HYDROXIDE-SIMETH 200-200-20 MG/5ML PO SUSP: 15.0000 mL | ORAL | Status: DC | PRN

## 2017-10-09 MED ORDER — PANTOPRAZOLE SODIUM 40 MG PO TBEC
40.0000 mg | DELAYED_RELEASE_TABLET | Freq: Every day | ORAL | Status: DC
  Administered 2017-10-09 – 2017-10-10 (×2): 40 mg via ORAL
  Filled 2017-10-09 (×2): qty 1

## 2017-10-09 MED ORDER — LISINOPRIL 20 MG PO TABS
20.0000 mg | ORAL_TABLET | Freq: Every day | ORAL | Status: DC
  Administered 2017-10-09 – 2017-10-10 (×2): 20 mg via ORAL
  Filled 2017-10-09 (×2): qty 1

## 2017-10-09 MED ORDER — PROMETHAZINE HCL 25 MG/ML IJ SOLN: 6.2500 mg | INTRAMUSCULAR | Status: DC | PRN

## 2017-10-09 MED ORDER — HEPARIN SODIUM (PORCINE) 5000 UNIT/ML IJ SOLN
5000.0000 [IU] | Freq: Three times a day (TID) | INTRAMUSCULAR | Status: DC
  Administered 2017-10-10: 5000 [IU] via SUBCUTANEOUS
  Filled 2017-10-09: qty 1

## 2017-10-09 MED ORDER — ROPIVACAINE HCL 5 MG/ML IJ SOLN
INTRAMUSCULAR | Status: DC | PRN
  Administered 2017-10-09: 40 mL via PERINEURAL

## 2017-10-09 MED ORDER — PHENOL 1.4 % MT LIQD: 1.0000 | OROMUCOSAL | Status: DC | PRN

## 2017-10-09 MED ORDER — LIDOCAINE-EPINEPHRINE (PF) 1 %-1:200000 IJ SOLN
INTRAMUSCULAR | Status: DC | PRN
  Administered 2017-10-09: 7 mL

## 2017-10-09 MED ORDER — MIDAZOLAM HCL 5 MG/5ML IJ SOLN
INTRAMUSCULAR | Status: DC | PRN
  Administered 2017-10-09: 2 mg via INTRAVENOUS

## 2017-10-09 MED ORDER — HYDROCHLOROTHIAZIDE 25 MG PO TABS
25.0000 mg | ORAL_TABLET | Freq: Every day | ORAL | Status: DC
  Administered 2017-10-09 – 2017-10-10 (×2): 25 mg via ORAL
  Filled 2017-10-09 (×2): qty 1

## 2017-10-09 MED ORDER — MIDAZOLAM HCL 2 MG/2ML IJ SOLN
1.0000 mg | Freq: Once | INTRAMUSCULAR | Status: AC
Start: 2017-10-09 — End: 2017-10-09
  Administered 2017-10-09: 1 mg via INTRAVENOUS

## 2017-10-09 MED ORDER — NITROGLYCERIN 0.4 MG SL SUBL: 0.4000 mg | SUBLINGUAL_TABLET | SUBLINGUAL | Status: DC | PRN

## 2017-10-09 MED ORDER — ASPIRIN EC 81 MG PO TBEC
81.0000 mg | DELAYED_RELEASE_TABLET | Freq: Two times a day (BID) | ORAL | Status: DC
  Administered 2017-10-09 – 2017-10-10 (×2): 81 mg via ORAL
  Filled 2017-10-09 (×2): qty 1

## 2017-10-09 MED ORDER — FENTANYL CITRATE (PF) 100 MCG/2ML IJ SOLN
50.0000 ug | Freq: Once | INTRAMUSCULAR | Status: AC
  Administered 2017-10-09: 50 ug via INTRAVENOUS

## 2017-10-09 MED ORDER — CLOPIDOGREL BISULFATE 75 MG PO TABS
75.0000 mg | ORAL_TABLET | Freq: Every day | ORAL | Status: DC
  Administered 2017-10-10: 75 mg via ORAL
  Filled 2017-10-09: qty 1

## 2017-10-09 MED ORDER — HYDRALAZINE HCL 20 MG/ML IJ SOLN: 5.0000 mg | INTRAMUSCULAR | Status: DC | PRN

## 2017-10-09 MED ORDER — LABETALOL HCL 5 MG/ML IV SOLN
10.0000 mg | INTRAVENOUS | Status: DC | PRN
  Filled 2017-10-09: qty 4

## 2017-10-09 MED ORDER — MEPERIDINE HCL 50 MG/ML IJ SOLN: 6.2500 mg | INTRAMUSCULAR | Status: DC | PRN

## 2017-10-09 MED ORDER — HYDROMORPHONE HCL 1 MG/ML IJ SOLN
0.5000 mg | INTRAMUSCULAR | Status: DC | PRN
  Administered 2017-10-10: 1 mg via INTRAVENOUS
  Filled 2017-10-09 (×2): qty 1

## 2017-10-09 MED ORDER — LISINOPRIL-HYDROCHLOROTHIAZIDE 20-25 MG PO TABS: 1.0000 | ORAL_TABLET | Freq: Every day | ORAL | Status: DC

## 2017-10-09 MED ORDER — METOPROLOL TARTRATE 100 MG PO TABS
100.0000 mg | ORAL_TABLET | Freq: Once | ORAL | Status: AC
  Administered 2017-10-09: 100 mg via ORAL
  Filled 2017-10-09: qty 1

## 2017-10-09 MED ORDER — MIDAZOLAM HCL 2 MG/2ML IJ SOLN: 0.5000 mg | Freq: Once | INTRAMUSCULAR | Status: DC | PRN

## 2017-10-09 MED ORDER — LIDOCAINE-EPINEPHRINE (PF) 1 %-1:200000 IJ SOLN
INTRAMUSCULAR | Status: AC
  Filled 2017-10-09: qty 30

## 2017-10-09 MED ORDER — METFORMIN HCL 850 MG PO TABS
850.0000 mg | ORAL_TABLET | Freq: Two times a day (BID) | ORAL | Status: DC
  Administered 2017-10-09 – 2017-10-10 (×2): 850 mg via ORAL
  Filled 2017-10-09 (×3): qty 1

## 2017-10-09 MED ORDER — METOPROLOL TARTRATE 50 MG PO TABS
ORAL_TABLET | ORAL | Status: AC
  Administered 2017-10-09: 100 mg via ORAL
  Filled 2017-10-09: qty 2

## 2017-10-09 MED ORDER — LIDOCAINE HCL (PF) 1 % IJ SOLN
INTRAMUSCULAR | Status: AC
  Filled 2017-10-09: qty 30

## 2017-10-09 MED ORDER — MIDAZOLAM HCL 2 MG/2ML IJ SOLN
INTRAMUSCULAR | Status: AC
  Administered 2017-10-09: 1 mg via INTRAVENOUS
  Filled 2017-10-09: qty 2

## 2017-10-09 MED ORDER — SODIUM CHLORIDE 0.9 % IV SOLN: INTRAVENOUS | Status: DC

## 2017-10-09 MED ORDER — METOPROLOL TARTRATE 5 MG/5ML IV SOLN: 2.0000 mg | INTRAVENOUS | Status: DC | PRN

## 2017-10-09 MED ORDER — SODIUM CHLORIDE 0.9 % IV SOLN
INTRAVENOUS | Status: DC
  Administered 2017-10-09 – 2017-10-10 (×2): via INTRAVENOUS

## 2017-10-09 MED ORDER — GABAPENTIN 300 MG PO CAPS
300.0000 mg | ORAL_CAPSULE | Freq: Two times a day (BID) | ORAL | Status: DC
  Administered 2017-10-09 – 2017-10-10 (×2): 300 mg via ORAL
  Filled 2017-10-09 (×3): qty 1

## 2017-10-09 MED ORDER — CEFAZOLIN SODIUM-DEXTROSE 2-4 GM/100ML-% IV SOLN
2.0000 g | INTRAVENOUS | Status: DC
  Filled 2017-10-09: qty 100

## 2017-10-09 MED ORDER — POTASSIUM CHLORIDE CRYS ER 20 MEQ PO TBCR: 20.0000 meq | EXTENDED_RELEASE_TABLET | Freq: Once | ORAL | Status: DC

## 2017-10-09 MED ORDER — ONDANSETRON HCL 4 MG/2ML IJ SOLN
INTRAMUSCULAR | Status: DC | PRN
  Administered 2017-10-09: 4 mg via INTRAVENOUS

## 2017-10-09 SURGICAL SUPPLY — 31 items
BANDAGE ACE 4X5 VEL STRL LF (GAUZE/BANDAGES/DRESSINGS) ×2 IMPLANT
BANDAGE ELASTIC 4 VELCRO ST LF (GAUZE/BANDAGES/DRESSINGS) ×1 IMPLANT
BLADE AVERAGE 25X9 (BLADE) IMPLANT
BLADE SAW SGTL 81X20 HD (BLADE) IMPLANT
BNDG GAUZE ELAST 4 BULKY (GAUZE/BANDAGES/DRESSINGS) ×2 IMPLANT
CANISTER SUCT 3000ML PPV (MISCELLANEOUS) ×2 IMPLANT
COVER SURGICAL LIGHT HANDLE (MISCELLANEOUS) ×2 IMPLANT
DRAPE EXTREMITY T 121X128X90 (DRAPE) ×2 IMPLANT
DRAPE HALF SHEET 40X57 (DRAPES) ×2 IMPLANT
DRESSING ADAPTIC 1/2  N-ADH (PACKING) ×1 IMPLANT
ELECT REM PT RETURN 9FT ADLT (ELECTROSURGICAL) ×2
ELECTRODE REM PT RTRN 9FT ADLT (ELECTROSURGICAL) ×1 IMPLANT
GAUZE SPONGE 4X4 12PLY STRL (GAUZE/BANDAGES/DRESSINGS) ×2 IMPLANT
GAUZE SPONGE 4X4 12PLY STRL LF (GAUZE/BANDAGES/DRESSINGS) ×1 IMPLANT
GLOVE BIO SURGEON STRL SZ7.5 (GLOVE) ×2 IMPLANT
GOWN STRL REUS W/ TWL LRG LVL3 (GOWN DISPOSABLE) ×2 IMPLANT
GOWN STRL REUS W/ TWL XL LVL3 (GOWN DISPOSABLE) ×1 IMPLANT
GOWN STRL REUS W/TWL LRG LVL3 (GOWN DISPOSABLE) ×4
GOWN STRL REUS W/TWL XL LVL3 (GOWN DISPOSABLE) ×2
KIT BASIN OR (CUSTOM PROCEDURE TRAY) ×2 IMPLANT
KIT TURNOVER KIT B (KITS) ×2 IMPLANT
NDL HYPO 25GX1X1/2 BEV (NEEDLE) IMPLANT
NEEDLE HYPO 25GX1X1/2 BEV (NEEDLE) IMPLANT
NS IRRIG 1000ML POUR BTL (IV SOLUTION) ×2 IMPLANT
PACK GENERAL/GYN (CUSTOM PROCEDURE TRAY) ×2 IMPLANT
PAD ARMBOARD 7.5X6 YLW CONV (MISCELLANEOUS) ×4 IMPLANT
SUT ETHILON 3 0 PS 1 (SUTURE) ×3 IMPLANT
SYR CONTROL 10ML LL (SYRINGE) IMPLANT
TOWEL GREEN STERILE (TOWEL DISPOSABLE) ×4 IMPLANT
UNDERPAD 30X30 (UNDERPADS AND DIAPERS) ×2 IMPLANT
WATER STERILE IRR 1000ML POUR (IV SOLUTION) ×2 IMPLANT

## 2017-10-09 NOTE — Anesthesia Preprocedure Evaluation (Addendum)
Anesthesia Evaluation  Patient identified by MRN, date of birth, ID band Patient awake    Reviewed: Allergy & Precautions, NPO status , Patient's Chart, lab work & pertinent test results, reviewed documented beta blocker date and time   Airway Mallampati: II  TM Distance: >3 FB Neck ROM: Full    Dental  (+) Dental Advisory Given   Pulmonary Current Smoker,    breath sounds clear to auscultation       Cardiovascular hypertension, Pt. on medications and Pt. on home beta blockers + CAD, + Past MI, + Cardiac Stents and + Peripheral Vascular Disease   Rhythm:Regular Rate:Normal  '17 Echo: normal left ventricular size, systolic function, and regional wall motion, LVEF 65%.  There is mild LVH. '17 stress test: small, mildly intense, partially reversible inferior wall defect, LVEF 57%.    Neuro/Psych negative neurological ROS     GI/Hepatic negative GI ROS, Neg liver ROS,   Endo/Other  diabetes, Oral Hypoglycemic Agents  Renal/GU negative Renal ROS     Musculoskeletal   Abdominal (+) + obese,   Peds  Hematology  (+) Blood dyscrasia (Hb 10.5, plavix), anemia ,   Anesthesia Other Findings   Reproductive/Obstetrics                            Anesthesia Physical Anesthesia Plan  ASA: III  Anesthesia Plan: Regional   Post-op Pain Management:    Induction:   PONV Risk Score and Plan: 0 and Ondansetron  Airway Management Planned: Natural Airway and Simple Face Mask  Additional Equipment:   Intra-op Plan:   Post-operative Plan:   Informed Consent: I have reviewed the patients History and Physical, chart, labs and discussed the procedure including the risks, benefits and alternatives for the proposed anesthesia with the patient or authorized representative who has indicated his/her understanding and acceptance.   Dental advisory given  Plan Discussed with: CRNA and Surgeon  Anesthesia  Plan Comments: (Plan routine monitors, Ankle block with sedation)        Anesthesia Quick Evaluation

## 2017-10-09 NOTE — H&P (Signed)
   History and Physical Update  The patient was interviewed and re-examined.  The patient's previous History and Physical has been reviewed and is unchanged from office visit yesterday. Plan for left 5th toe amputation.  Harsh Trulock C. Randie Heinzain, MD [facility] Office: [phone] Pager: [phone]   10/09/2017, 9:25 AM

## 2017-10-09 NOTE — Anesthesia Procedure Notes (Signed)
Anesthesia Regional Block: Ankle block   Pre-Anesthetic Checklist: ,, timeout performed, Correct Patient, Correct Site, Correct Laterality, Correct Procedure, Correct Position, site marked, Risks and benefits discussed, Surgical consent,  Pre-op evaluation,  At surgeon's request  Laterality: Left and Lower  Prep: chloraprep       Needles:  Injection technique: Single-shot  Needle Type: Quincke     Needle Length: 4cm  Needle Gauge: 25     Additional Needles:   (perineural infiltration)  Narrative:  Start time: 10/09/2017 10:35 AM End time: 10/09/2017 10:47 AM Injection made incrementally with aspirations every 5 mL.  Performed by: Personally  Anesthesiologist: Jairo BenJackson, Kevante Lunt, MD  Additional Notes: Pt identified in Holding room.  Monitors applied. Working IV access confirmed. Sterile prep, drape.  #25ga infiltration around deep and sup peroneal, saph, sural, and post tib nerves.  40cc 0.5% Ropivacaine injected incrementally after negative test dose.  Patient asymptomatic, VSS, no heme aspirated, tolerated well.  Sandford Craze Griselda Tosh, MD

## 2017-10-09 NOTE — Anesthesia Procedure Notes (Signed)
Procedure Name: MAC Date/Time: 10/09/2017 11:00 AM Performed by: Izora Gala, CRNA Pre-anesthesia Checklist: Patient identified, Emergency Drugs available, Suction available and Patient being monitored Oxygen Delivery Method: Nasal cannula Placement Confirmation: positive ETCO2

## 2017-10-09 NOTE — Anesthesia Postprocedure Evaluation (Signed)
Anesthesia Post Note  Patient: Bronson CurbMelvin Robert J Lehrke  Procedure(s) Performed: AMPUTATION DIGIT LEFT FIFTH TOE (Left )     Patient location during evaluation: PACU Anesthesia Type: Regional Level of consciousness: awake and alert, oriented and patient cooperative Pain management: pain level controlled Vital Signs Assessment: post-procedure vital signs reviewed and stable Respiratory status: spontaneous breathing, nonlabored ventilation and respiratory function stable Cardiovascular status: blood pressure returned to baseline and stable Postop Assessment: no apparent nausea or vomiting Anesthetic complications: no    Last Vitals:  Vitals:   10/09/17 1225 10/09/17 1315  BP: 130/65 128/65  Pulse: 70 70  Resp: 18 16  Temp: 36.5 C 36.5 C  SpO2: 98% 100%    Last Pain:  Vitals:   10/09/17 1315  TempSrc: Oral  PainSc:                  Jesper Stirewalt,E. Anesia Blackwell

## 2017-10-09 NOTE — Transfer of Care (Signed)
Immediate Anesthesia Transfer of Care Note  Patient: Joel Hebert  Procedure(s) Performed: AMPUTATION DIGIT LEFT FIFTH TOE (Left )  Patient Location: PACU  Anesthesia Type:MAC  Level of Consciousness: awake, alert  and patient cooperative  Airway & Oxygen Therapy: Patient Spontanous Breathing and Patient connected to nasal cannula oxygen  Post-op Assessment: Report given to RN, Post -op Vital signs reviewed and stable, Patient moving all extremities and Patient moving all extremities X 4  Post vital signs: Reviewed and stable  Last Vitals:  Vitals Value Taken Time  BP 130/83 10/09/2017 11:41 AM  Temp 36.6 C 10/09/2017 11:42 AM  Pulse 62 10/09/2017 11:45 AM  Resp 10 10/09/2017 11:45 AM  SpO2 100 % 10/09/2017 11:45 AM  Vitals shown include unvalidated device data.  Last Pain:  Vitals:   10/09/17 1142  TempSrc:   PainSc: 0-No pain      Patients Stated Pain Goal: 4 (76/73/41 9379)  Complications: No apparent anesthesia complications

## 2017-10-09 NOTE — Op Note (Signed)
    Patient name: Bassam Dresch MRN: [id number] DOB: 1955/08/23 Sex: male  10/09/2017 Pre-operative Diagnosis: Gangrene of left fifth toe Post-operative diagnosis:  Same Surgeon:  Eda Paschal. Donzetta Matters, MD Procedure Performed: Amputation left fifth toe  Indications: 62 year old male who is undergone endovascular revascularization of his bilateral lower extremities including a retrograde posterior tibial access on the left.  He now has significant pain and stable dry gangrene of the left fifth toe.  He also has dry ulcers on his bilateral great toes but these are not causing him any discomfort.  He is indicated for left fifth toe amputation.  Findings: There is adequate bleeding in the wound bed and the tissue approximated well completion.   Procedure:  The patient was identified in the holding area and taken to the operating room where he was placed supine on the operative table MAC anesthesia induced.  An ankle block and been placed in preoperative holding.  He was sterilely prepped and draped in the left lower extremity given antibiotics and timeout called.  We began with circumferential incision around the left small toe.  I dissected down to the bone with cautery and then transected the bone at the MTP joint.  I used a rongeur to debride the bone back this was all healthy tissue.  There is adequate bleeding within the tissue that was controlled with cautery.  I irrigated the wound closed the deep layer with an interrupted Vicryl stitch and then placed interrupted nylon stitches to the level of the skin.  All counts were correct at completion he tolerated procedure well without immediate comp occasion.  Next  EBL 10 cc.   Jiah Bari C. Donzetta Matters, MD [facility] Office: [phone] Pager: [phone]

## 2017-10-10 ENCOUNTER — Encounter (HOSPITAL_COMMUNITY): Payer: Self-pay | Admitting: Vascular Surgery

## 2017-10-10 DIAGNOSIS — E1152 Type 2 diabetes mellitus with diabetic peripheral angiopathy with gangrene: Secondary | ICD-10-CM | POA: Diagnosis not present

## 2017-10-10 LAB — BASIC METABOLIC PANEL
ANION GAP: 10 (ref 5–15)
BUN: 13 mg/dL (ref 6–20)
CALCIUM: 8.6 mg/dL — AB (ref 8.9–10.3)
CO2: 26 mmol/L (ref 22–32)
Chloride: 100 mmol/L — ABNORMAL LOW (ref 101–111)
Creatinine, Ser: 1.27 mg/dL — ABNORMAL HIGH (ref 0.61–1.24)
GFR, EST NON AFRICAN AMERICAN: 58 mL/min — AB (ref >60)
Glucose, Bld: 120 mg/dL — ABNORMAL HIGH (ref 65–99)
Potassium: 4.8 mmol/L (ref 3.5–5.1)
SODIUM: 136 mmol/L (ref 135–145)

## 2017-10-10 LAB — CBC
HCT: 29.6 % — ABNORMAL LOW (ref 39.0–52.0)
Hemoglobin: 9.5 g/dL — ABNORMAL LOW (ref 13.0–17.0)
MCH: 28.5 pg (ref 26.0–34.0)
MCHC: 32.1 g/dL (ref 30.0–36.0)
MCV: 88.9 fL (ref 78.0–100.0)
PLATELETS: 243 10*3/uL (ref 150–400)
RBC: 3.33 MIL/uL — ABNORMAL LOW (ref 4.22–5.81)
RDW: 14.4 % (ref 11.5–15.5)
WBC: 5.7 10*3/uL (ref 4.0–10.5)

## 2017-10-10 LAB — HIV ANTIBODY (ROUTINE TESTING W REFLEX): HIV SCREEN 4TH GENERATION: NONREACTIVE

## 2017-10-10 MED ORDER — OXYCODONE-ACETAMINOPHEN 5-325 MG PO TABS: 1.0000 | ORAL_TABLET | Freq: Four times a day (QID) | ORAL | 0 refills | Status: DC | PRN

## 2017-10-10 NOTE — Discharge Summary (Signed)
Discharge Summary    Joel Hebert 1956/06/23 62 y.o. male  [id number]  Admission Date: 10/09/2017  Discharge Date: 10/10/17  Physician: Juventino Slovak*  Admission Diagnosis: left fifth toe gangrene   HPI:   This is a 62 y.o. male who is s/pleft sided SFA stenting followed by TP trunk balloon angioplasty and atherectomyon 09-05-17 by Dr. Randie Heinz. He also had TP trunk balloon angioplasty and on the right side.  Dr. Randie Heinz evaluated pt on 09-14-17 At that timehiswounds werecontinuing to give him pain particularly on the left small toe. Otherwise therewasno progression. Hehad,swelling statedthat he could notelevate his legs given the amount of pain that he ws having.Heisstatus post revascularization of both lower extremities now on aspirin Plavix. Hewas having significant pain particularly over the left small toe.Dr. Gayla Medicus pt a prescription for40 Percocet and told him this is the last prescription we will give him short of having other surgery. Pt was to follow upin 2-3 weeks to track wound progression and get ABIs at that time. Should he have persistent pain he may need amputation of his left small toe butDr. Randie Heinz isconcerned about healing.   I evaluated pt on 09-28-17. At that time bilateral toe ulcers are granulating and contracting. No surgical debridement necessary at that time.Dr. Randie Heinz spoke withpt and wifeand examined pt, discussed possible amputation left 5th toe if his pain there remains intolerable. Gabapentin added, pt states he ran out, 300 mg po bid, disp #30, 0 refills. The patient was counseled re smoking cessation and given several free resources re smoking cessation. To decrease swelling in your feet and legs: Elevate feet above slightly bent knees, feet above heart, overnight and 3-4 times per day for 20 minutes.Pt and wife instructed in this. Pt was to return in 1 monthfor evaluation of toe ulcers, can see me or Dr. Randie Heinz,  must be on a day that Dr. Randie Heinz is in the office.  He returns today stating "my dead toe is killing me and I'm ready to have it taken off", clarified that it is pt's left 5th toe.   He has a follow up appointment scheduled with Dr. Randie Heinz on 11-02-17.   He remains on aspirin and Plavix.  He was treated at the wound care center in [place], states he was discharged from there, was told that they could do nothing more for him.   Hospital Course:  The patient was admitted to the hospital and taken to the operating room on 10/09/2017 and underwent: Amputation of the left 5th toe     Findings: There is adequate bleeding in the wound bed and the tissue approximated well completion.  The pt tolerated the procedure well and was transported to the PACU in good condition.   By POD 1, he was doing well.  He was evaluated by PT and they recommended no PT follow up.  He was discharged home with plans to return in a few weeks for suture removal.   The remainder of the hospital course consisted of increasing mobilization and increasing intake of solids without difficulty.  CBC    Component Value Date/Time   WBC 5.7 10/10/2017 0412   RBC 3.33 (L) 10/10/2017 0412   HGB 9.5 (L) 10/10/2017 0412   HCT 29.6 (L) 10/10/2017 0412   PLT 243 10/10/2017 0412   MCV 88.9 10/10/2017 0412   MCH 28.5 10/10/2017 0412   MCHC 32.1 10/10/2017 0412   RDW 14.4 10/10/2017 0412   LYMPHSABS 2.8 08/05/2017 0308   MONOABS 0.4  08/05/2017 0308   EOSABS 0.4 08/05/2017 0308   BASOSABS 0.1 08/05/2017 0308    BMET    Component Value Date/Time   NA 136 10/10/2017 0412   K 4.8 10/10/2017 0412   CL 100 (L) 10/10/2017 0412   CO2 26 10/10/2017 0412   GLUCOSE 120 (H) 10/10/2017 0412   BUN 13 10/10/2017 0412   CREATININE 1.27 (H) 10/10/2017 0412   CALCIUM 8.6 (L) 10/10/2017 0412   GFRNONAA 58 (L) 10/10/2017 0412   GFRAA >60 10/10/2017 0412      Discharge Instructions    Call MD for:  redness, tenderness, or signs  of infection (pain, swelling, bleeding, redness, odor or green/yellow discharge around incision site)   Complete by:  As directed    Call MD for:  severe or increased pain, loss or decreased feeling  in affected limb(s)   Complete by:  As directed    Call MD for:  temperature >100.5   Complete by:  As directed    Discharge instructions   Complete by:  As directed    Heel weight bearing only on affected foot as to keep pressure off of the amputation site to help it heal.   Discharge wound care:   Complete by:  As directed    Dry dressing to amputation site and then apply ace wrap  Daily.   Driving Restrictions   Complete by:  As directed    No driving for 2 weeks and while taking pain medication.   Lifting restrictions   Complete by:  As directed    No heavy lifting for 3 weeks   Resume previous diet   Complete by:  As directed       Discharge Diagnosis:  left fifth toe gangrene  Secondary Diagnosis: Patient Active Problem List   Diagnosis Date Noted  . PAD (peripheral artery disease) (HCC) 08/14/2017   Past Medical History:  Diagnosis Date  . Coronary artery disease   . High cholesterol   . Hypertension   . MI (myocardial infarction) (HCC) 08/13/2014; 01/2016- 03/2016 X 3  . PAD (peripheral artery disease) (HCC)   . Peripheral vascular disease (HCC)   . Type II diabetes mellitus (HCC)      Allergies as of 10/10/2017   No Known Allergies     Medication List    STOP taking these medications   sulfamethoxazole-trimethoprim 400-80 MG tablet Commonly known as:  BACTRIM,SEPTRA     TAKE these medications   acetaminophen 500 MG tablet Commonly known as:  TYLENOL Take 500 mg by mouth every 6 (six) hours as needed (FOR PAIN).   aspirin EC 81 MG tablet Take 81 mg by mouth 2 (two) times daily.   clopidogrel 75 MG tablet Commonly known as:  PLAVIX Take 1 tablet (75 mg total) by mouth daily with breakfast.   diclofenac 50 MG EC tablet Commonly known as:  VOLTAREN Take  1 tablet (50 mg total) by mouth 2 (two) times daily.   gabapentin 300 MG capsule Commonly known as:  NEURONTIN Take 1 capsule (300 mg total) by mouth 2 (two) times daily.   lisinopril-hydrochlorothiazide 20-25 MG tablet Commonly known as:  PRINZIDE,ZESTORETIC Take 1 tablet by mouth daily.   metFORMIN 850 MG tablet Commonly known as:  GLUCOPHAGE Take 850 mg by mouth 2 (two) times daily with a meal.   metoprolol tartrate 100 MG tablet Commonly known as:  LOPRESSOR Take 100 mg by mouth 2 (two) times daily.   nitroGLYCERIN 0.4 MG SL tablet  Commonly known as:  NITROSTAT Place 0.4 mg under the tongue every 5 (five) minutes as needed for chest pain.   oxyCODONE-acetaminophen 5-325 MG tablet Commonly known as:  PERCOCET/ROXICET Take 1 tablet by mouth every 6 (six) hours as needed for severe pain. What changed:    how much to take  when to take this   rosuvastatin 20 MG tablet Commonly known as:  CRESTOR Take 20 mg by mouth every evening.            Discharge Care Instructions  (From admission, onward)        Start     Ordered   10/10/17 0000  Discharge wound care:    Comments:  Dry dressing to amputation site and then apply ace wrap  Daily.   10/10/17 0843      Prescriptions given: Roxicet #30 No Refill  Instructions: 1.  No driving for 2 weeks and while taking pain medication 2.  No heavy lifting x 3 weeks 3.  Change dressing daily with dry gauze and ace wrap 4.  Heal weight bearing only  Disposition: home  Patient's condition: is Good  Follow up: 1. Dr. Randie Heinz in 3 weeks 2. [facility] - make appt as soon as possible.   Doreatha Massed, PA-C [facility] [phone] 10/10/2017  8:43 AM

## 2017-10-10 NOTE — Evaluation (Signed)
Physical Therapy Evaluation Patient Details Name: Joel Hebert MRN: [id number] DOB: May 16, 1956 Today's Date: 10/10/2017   History of Present Illness  62yo male with ongoing painful wounds on his feet who is s/p revascularization of B LEs. He received amputation of his L 5th toe on 10/09/17. PMH L SFA stenting, TP trunk balloon angioplasty, artherectomy, HTN, MI, DM, PVD, hx multiple peripheral vascular surgical interventions   Clinical Impression   Patient received up in chair, very pleasant and willing to participate with PT this morning, he reports he brought his personal crutches from home as "I figured I'd need them". He is able to perform functional transfers and gait approximately 149f with S and shows good ability to maintain weight on heel today; practiced stair navigation with patient initially requiring Min guard but improving to close S for ascent and descent of stairs with crutches while maintaining correct weight bearing status. He was left up in the chair with all needs met, all questions and concerns addressed this morning and states his priority is for his foot to heal correctly. He will continue to benefit from skilled PT services in the acute setting, but does not appear to be in need of skilled PT services after discharge from this facility.     Follow Up Recommendations No PT follow up    Equipment Recommendations  None recommended by PT(patient has all necessary DME )    Recommendations for Other Services       Precautions / Restrictions Precautions Precautions: None Restrictions Weight Bearing Restrictions: Yes [name] Weight Bearing: Weight bearing as tolerated Other Position/Activity Restrictions: WBAT on L heel only       Mobility  Bed Mobility               General bed mobility comments: DNT, received up in chair   Transfers Overall transfer level: Needs assistance Equipment used: Crutches Transfers: Sit to/from Stand Sit to Stand: Supervision         General transfer comment: S for safety, occasional cues to maintain WBAT L heel   Ambulation/Gait Ambulation/Gait assistance: Supervision Ambulation Distance (Feet): 140 Feet Assistive device: Crutches Gait Pattern/deviations: Step-to pattern;Decreased stance time - left;Decreased step length - right     General Gait Details: able to maintain steady gait speed with WBAT on L heel, only occasional cues required to maintain weight on heel today, no balance or safety deficits noted   Stairs Stairs: Yes Stairs assistance: Min guard Stair Management: No rails;With crutches Number of Stairs: 14 General stair comments: initial cues for safe ascent/descent with crutches and to maintain WBAT on heel, patient able to perform safely with ongoing practice   Wheelchair Mobility    Modified Rankin (Stroke Patients Only)       Balance Overall balance assessment: No apparent balance deficits (not formally assessed)                                           Pertinent Vitals/Pain Pain Assessment: No/denies pain    Home Living Family/patient expects to be discharged to:: Private residence Living Arrangements: Spouse/significant other Available Help at Discharge: Family Type of Home: House Home Access: Stairs to enter Entrance Stairs-Rails: None Entrance Stairs-Number of Steps: 2 Home Layout: One level Home Equipment: Cane - single point;Walker - 2 wheels;Crutches Additional Comments: reports he has assistive devices but does not use them  Prior Function Level of Independence: Independent               Hand Dominance        Extremity/Trunk Assessment        Lower Extremity Assessment Lower Extremity Assessment: Overall WFL for tasks assessed    Cervical / Trunk Assessment Cervical / Trunk Assessment: Normal  Communication   Communication: No difficulties  Cognition Arousal/Alertness: Awake/alert Behavior During Therapy: WFL for tasks  assessed/performed Overall Cognitive Status: Within Functional Limits for tasks assessed                                        General Comments      Exercises     Assessment/Plan    PT Assessment Patient needs continued PT services  PT Problem List Decreased mobility;Pain       PT Treatment Interventions DME instruction;Therapeutic activities;Gait training;Therapeutic exercise;Patient/family education;Stair training;Balance training;Functional mobility training;Neuromuscular re-education    PT Goals (Current goals can be found in the Care Plan section)  Acute Rehab PT Goals Patient Stated Goal: to go home, make sure his foot heals well  PT Goal Formulation: With patient Time For Goal Achievement: 10/24/17 Potential to Achieve Goals: Good    Frequency Min 3X/week   Barriers to discharge        Co-evaluation               AM-PAC PT "6 Clicks" Daily Activity  Outcome Measure Difficulty turning over in bed (including adjusting bedclothes, sheets and blankets)?: None Difficulty moving from lying on back to sitting on the side of the bed? : None Difficulty sitting down on and standing up from a chair with arms (e.g., wheelchair, bedside commode, etc,.)?: None Help needed moving to and from a bed to chair (including a wheelchair)?: None Help needed walking in hospital room?: A Little Help needed climbing 3-5 steps with a railing? : A Little 6 Click Score: 22    End of Session Equipment Utilized During Treatment: Gait belt Activity Tolerance: Patient tolerated treatment well Patient left: in chair;with call bell/phone within reach   PT Visit Diagnosis: Difficulty in walking, not elsewhere classified (R26.2);Pain Pain - Right/Left: Left Pain - part of body: [name]    Time: 2409-7353 PT Time Calculation (min) (ACUTE ONLY): 17 min   Charges:   PT Evaluation $PT Eval Low Complexity: 1 Low     PT G Codes:        Deniece Ree PT, DPT, CBIS  Supplemental Physical Therapist [place]   Pager [phone]

## 2017-10-10 NOTE — Progress Notes (Signed)
Patient discharged home with instructions and prescription

## 2017-10-10 NOTE — Progress Notes (Signed)
  Progress Note    10/10/2017 10:54 AM 1 Day Post-Op  Subjective: Having pain left toe amputation site  Vitals:   10/10/17 0205 10/10/17 0629  BP: 117/60 (!) 153/80  Pulse: 73 74  Resp: 18 19  Temp: 98.5 F (36.9 C) 98.3 F (36.8 C)  SpO2: 99% 100%    Physical Exam: Awake alert and oriented Bilateral feet are warm Stable gangrenous changes bilateral great toes   CBC    Component Value Date/Time   WBC 5.7 10/10/2017 0412   RBC 3.33 (L) 10/10/2017 0412   HGB 9.5 (L) 10/10/2017 0412   HCT 29.6 (L) 10/10/2017 0412   PLT 243 10/10/2017 0412   MCV 88.9 10/10/2017 0412   MCH 28.5 10/10/2017 0412   MCHC 32.1 10/10/2017 0412   RDW 14.4 10/10/2017 0412   LYMPHSABS 2.8 08/05/2017 0308   MONOABS 0.4 08/05/2017 0308   EOSABS 0.4 08/05/2017 0308   BASOSABS 0.1 08/05/2017 0308    BMET    Component Value Date/Time   NA 136 10/10/2017 0412   K 4.8 10/10/2017 0412   CL 100 (L) 10/10/2017 0412   CO2 26 10/10/2017 0412   GLUCOSE 120 (H) 10/10/2017 0412   BUN 13 10/10/2017 0412   CREATININE 1.27 (H) 10/10/2017 0412   CALCIUM 8.6 (L) 10/10/2017 0412   GFRNONAA 58 (L) 10/10/2017 0412   GFRAA >60 10/10/2017 0412    INR No results found for: INR   Intake/Output Summary (Last 24 hours) at 10/10/2017 1054 Last data filed at 10/10/2017 0941 Gross per 24 hour  Intake 2012.5 ml  Output 1710 ml  Net 302.5 ml     Assessment:  62 y.o. male is s/p left fifth toe amputation for persistent pain  Plan: Physical therapy to evaluate, he is heel weightbearing only Okay to discharge today on aspirin Plavix Follow-up in a few weeks for suture removal We will continue to watch his bilateral great toes demarcate   Cassadee Vanzandt C. Randie Heinzain, MD [facility] [place] Office: [phone] Pager: [phone]  10/10/2017 10:54 AM

## 2017-10-11 ENCOUNTER — Ambulatory Visit (HOSPITAL_COMMUNITY): Payer: Self-pay | Admitting: Physical Therapy

## 2017-10-15 ENCOUNTER — Ambulatory Visit (HOSPITAL_COMMUNITY): Payer: Self-pay | Admitting: Physical Therapy

## 2017-10-18 ENCOUNTER — Ambulatory Visit (HOSPITAL_COMMUNITY): Payer: Self-pay | Admitting: Physical Therapy

## 2017-10-22 ENCOUNTER — Ambulatory Visit (HOSPITAL_COMMUNITY): Payer: Self-pay

## 2017-10-25 ENCOUNTER — Ambulatory Visit (HOSPITAL_COMMUNITY): Payer: Self-pay | Admitting: Physical Therapy

## 2017-10-29 ENCOUNTER — Ambulatory Visit (HOSPITAL_COMMUNITY): Payer: Self-pay | Admitting: Physical Therapy

## 2017-11-01 ENCOUNTER — Ambulatory Visit (HOSPITAL_COMMUNITY): Payer: Self-pay | Admitting: Physical Therapy

## 2017-11-02 ENCOUNTER — Other Ambulatory Visit: Payer: Self-pay

## 2017-11-02 ENCOUNTER — Other Ambulatory Visit: Payer: Self-pay | Admitting: *Deleted

## 2017-11-02 ENCOUNTER — Encounter: Payer: Self-pay | Admitting: Vascular Surgery

## 2017-11-02 ENCOUNTER — Ambulatory Visit (INDEPENDENT_AMBULATORY_CARE_PROVIDER_SITE_OTHER): Payer: Self-pay | Admitting: Vascular Surgery

## 2017-11-02 ENCOUNTER — Encounter: Payer: Self-pay | Admitting: *Deleted

## 2017-11-02 VITALS — BP 173/82 | HR 91 | Temp 98.1°F | Resp 16 | Ht 68.0 in | Wt 200.0 lb

## 2017-11-02 DIAGNOSIS — I779 Disorder of arteries and arterioles, unspecified: Secondary | ICD-10-CM

## 2017-11-02 MED ORDER — OXYCODONE-ACETAMINOPHEN 5-325 MG PO TABS: 1.0000 | ORAL_TABLET | ORAL | 0 refills | Status: DC | PRN

## 2017-11-02 NOTE — Progress Notes (Signed)
POST OPERATIVE OFFICE NOTE    CC:  F/u for surgery  HPI:  This is a 62 y.o. 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 Randie Heinz.  He presents today for follow up.  He states that he has been soaking his foot in epsom salts and it has taken a lot of the soreness away.  He has been putting dry dressing on this.  He is on Plavix daily.    No Known Allergies  Current Outpatient Medications  Medication Sig Dispense Refill  . acetaminophen (TYLENOL) 500 MG tablet Take 500 mg by mouth every 6 (six) hours as needed (FOR PAIN).     Marland Kitchen aspirin EC 81 MG tablet Take 81 mg by mouth 2 (two) times daily.     . clopidogrel (PLAVIX) 75 MG tablet Take 1 tablet (75 mg total) by mouth daily with breakfast. 30 tablet 11  . diclofenac (VOLTAREN) 50 MG EC tablet Take 1 tablet (50 mg total) by mouth 2 (two) times daily. 20 tablet 0  . gabapentin (NEURONTIN) 300 MG capsule Take 1 capsule (300 mg total) by mouth 2 (two) times daily. 30 capsule 0  . lisinopril-hydrochlorothiazide (PRINZIDE,ZESTORETIC) 20-25 MG tablet Take 1 tablet by mouth daily.   2  . metFORMIN (GLUCOPHAGE) 850 MG tablet Take 850 mg by mouth 2 (two) times daily with a meal.    . metoprolol tartrate (LOPRESSOR) 100 MG tablet Take 100 mg by mouth 2 (two) times daily.    . nitroGLYCERIN (NITROSTAT) 0.4 MG SL tablet Place 0.4 mg under the tongue every 5 (five) minutes as needed for chest pain.    . rosuvastatin (CRESTOR) 20 MG tablet Take 20 mg by mouth every evening.     Marland Kitchen oxyCODONE-acetaminophen (PERCOCET/ROXICET) 5-325 MG tablet Take 1 tablet by mouth every 6 (six) hours as needed for severe pain. (Patient not taking: Reported on 11/02/2017) 30 tablet 0   No current facility-administered medications for this visit.      ROS:  See HPI  Physical Exam:  Vitals:   11/02/17 1544 11/02/17 1548  BP: (!) 187/87 (!) 173/82  Pulse: 87 91  Resp: 16   Temp: 98.1 F (36.7 C)   SpO2: 100%     Incision:    Left 5th toe amputation site  11/02/17  Extremities:  +doppler signal bilateral PT; dry gangrene of bilateral great toes (improved)   Assessment/Plan:  This is a 62 y.o. male who is s/p:  amputation left 5th toe on 10/09/17 by Dr. Randie Heinz  -pt with toe amp site with fibrinous tissue present but no evidence of infection. He did not tolerate debridement by Dr. Randie Heinz.  He feels that he should be taken to the operating room on Tuesday the 30th for debridement to clean up the wound.  Will order a post op shoe at that time.  Pt is on Plavix and he will continue this.  Pt understands and agrees to proceed.  -gangrene of bilateral great toes looks good and is improved -he has + doppler signals bilateral PT -will place wet to dry dressing today.  He may continue his foot soaks.  -prescription for Percocet sent electronically to pharmacy by Dr. Johny Shears, PA-C [facility] [phone]  Clinic MD:  Pt seen and examined with Dr. Randie Heinz    I have interviewed examined sutures were removed in office today.  I attempted to debride the wound the bedside but unfortunately he cannot tolerate.  Patient  with PA and agree with assessment and plan above.  He will need to go to the operating room to have this further debrided in hopes of salvaging this small toe amputation on the left.  He does have good arterial flow by Doppler today in both feet and the great toes bilaterally have stable dry gangrenous changes that we will continue to watch.  We will get him a postoperative shoe after the operation and he can discharge that day he will need to bring a ride with him.  Brandon C. Randie Heinzain, MD [facility] [place] Office: [phone] Pager: [phone]

## 2017-11-05 ENCOUNTER — Encounter (HOSPITAL_COMMUNITY): Payer: Self-pay | Admitting: *Deleted

## 2017-11-05 ENCOUNTER — Other Ambulatory Visit: Payer: Self-pay

## 2017-11-05 NOTE — Progress Notes (Signed)
Joel Hebert denies chesty pain or shortness of breath. Patient does not check CBGs, "I dont know where my machine is."  I instructed patient to eat a snack prior to bedtime and to not take Metformin in am.

## 2017-11-06 ENCOUNTER — Encounter (HOSPITAL_COMMUNITY): Payer: Self-pay | Admitting: *Deleted

## 2017-11-06 ENCOUNTER — Inpatient Hospital Stay (HOSPITAL_COMMUNITY)
Admission: RE | Admit: 2017-11-06 | Discharge: 2017-11-09 | DRG: 505 | Disposition: A | Discharge status: Home Health | Payer: Medicaid Other | Source: Ambulatory Visit | Attending: Vascular Surgery | Admitting: Vascular Surgery

## 2017-11-06 ENCOUNTER — Encounter (HOSPITAL_COMMUNITY)
Admission: RE | Disposition: A | Discharge status: Home Health | Payer: Self-pay | Source: Ambulatory Visit | Attending: Vascular Surgery

## 2017-11-06 ENCOUNTER — Ambulatory Visit (HOSPITAL_COMMUNITY): Payer: Medicaid Other

## 2017-11-06 DIAGNOSIS — Z7984 Long term (current) use of oral hypoglycemic drugs: Secondary | ICD-10-CM

## 2017-11-06 DIAGNOSIS — F17200 Nicotine dependence, unspecified, uncomplicated: Secondary | ICD-10-CM | POA: Diagnosis not present

## 2017-11-06 DIAGNOSIS — Z7902 Long term (current) use of antithrombotics/antiplatelets: Secondary | ICD-10-CM

## 2017-11-06 DIAGNOSIS — E1151 Type 2 diabetes mellitus with diabetic peripheral angiopathy without gangrene: Secondary | ICD-10-CM | POA: Diagnosis present

## 2017-11-06 DIAGNOSIS — Z791 Long term (current) use of non-steroidal anti-inflammatories (NSAID): Secondary | ICD-10-CM | POA: Diagnosis not present

## 2017-11-06 DIAGNOSIS — I10 Essential (primary) hypertension: Secondary | ICD-10-CM | POA: Diagnosis present

## 2017-11-06 DIAGNOSIS — I2510 Atherosclerotic heart disease of native coronary artery without angina pectoris: Secondary | ICD-10-CM | POA: Diagnosis present

## 2017-11-06 DIAGNOSIS — E7800 Pure hypercholesterolemia, unspecified: Secondary | ICD-10-CM | POA: Diagnosis not present

## 2017-11-06 DIAGNOSIS — Z7982 Long term (current) use of aspirin: Secondary | ICD-10-CM

## 2017-11-06 DIAGNOSIS — T8781 Dehiscence of amputation stump: Principal | ICD-10-CM | POA: Diagnosis present

## 2017-11-06 DIAGNOSIS — Y838 Other surgical procedures as the cause of abnormal reaction of the patient, or of later complication, without mention of misadventure at the time of the procedure: Secondary | ICD-10-CM | POA: Diagnosis not present

## 2017-11-06 DIAGNOSIS — I739 Peripheral vascular disease, unspecified: Secondary | ICD-10-CM | POA: Diagnosis not present

## 2017-11-06 DIAGNOSIS — T8789 Other complications of amputation stump: Secondary | ICD-10-CM

## 2017-11-06 DIAGNOSIS — I252 Old myocardial infarction: Secondary | ICD-10-CM | POA: Diagnosis not present

## 2017-11-06 HISTORY — PX: WOUND DEBRIDEMENT: SHX247

## 2017-11-06 LAB — GLUCOSE, CAPILLARY
GLUCOSE-CAPILLARY: 135 mg/dL — AB (ref 65–99)
Glucose-Capillary: 130 mg/dL — ABNORMAL HIGH (ref 65–99)
Glucose-Capillary: 117 mg/dL — ABNORMAL HIGH (ref 65–99)
Glucose-Capillary: 164 mg/dL — ABNORMAL HIGH (ref 65–99)
Glucose-Capillary: 156 mg/dL — ABNORMAL HIGH (ref 65–99)

## 2017-11-06 LAB — COMPREHENSIVE METABOLIC PANEL
ALK PHOS: 74 U/L (ref 38–126)
ALT: 13 U/L — ABNORMAL LOW (ref 17–63)
ANION GAP: 7 (ref 5–15)
AST: 15 U/L (ref 15–41)
Albumin: 3.8 g/dL (ref 3.5–5.0)
BILIRUBIN TOTAL: 0.3 mg/dL (ref 0.3–1.2)
BUN: 12 mg/dL (ref 6–20)
CALCIUM: 9.4 mg/dL (ref 8.9–10.3)
CO2: 26 mmol/L (ref 22–32)
Chloride: 105 mmol/L (ref 101–111)
Creatinine, Ser: 1.02 mg/dL (ref 0.61–1.24)
Glucose, Bld: 132 mg/dL — ABNORMAL HIGH (ref 65–99)
Potassium: 4.1 mmol/L (ref 3.5–5.1)
SODIUM: 138 mmol/L (ref 135–145)
TOTAL PROTEIN: 7 g/dL (ref 6.5–8.1)

## 2017-11-06 LAB — SURGICAL PCR SCREEN
MRSA, PCR: NEGATIVE
Staphylococcus aureus: NEGATIVE

## 2017-11-06 LAB — CBC
HEMATOCRIT: 33.9 % — AB (ref 39.0–52.0)
HEMOGLOBIN: 10.9 g/dL — AB (ref 13.0–17.0)
MCH: 28.2 pg (ref 26.0–34.0)
MCHC: 32.2 g/dL (ref 30.0–36.0)
MCV: 87.6 fL (ref 78.0–100.0)
Platelets: 292 10*3/uL (ref 150–400)
RBC: 3.87 MIL/uL — ABNORMAL LOW (ref 4.22–5.81)
RDW: 14.6 % (ref 11.5–15.5)
WBC: 5.1 10*3/uL (ref 4.0–10.5)

## 2017-11-06 SURGERY — DEBRIDEMENT, WOUND
Anesthesia: Monitor Anesthesia Care | Site: Foot | Laterality: Left

## 2017-11-06 MED ORDER — 0.9 % SODIUM CHLORIDE (POUR BTL) OPTIME
TOPICAL | Status: DC | PRN
  Administered 2017-11-06: 1000 mL

## 2017-11-06 MED ORDER — CEFAZOLIN SODIUM-DEXTROSE 2-4 GM/100ML-% IV SOLN
2.0000 g | INTRAVENOUS | Status: AC
  Administered 2017-11-06: 2 g via INTRAVENOUS
  Filled 2017-11-06: qty 100

## 2017-11-06 MED ORDER — LABETALOL HCL 5 MG/ML IV SOLN
10.0000 mg | INTRAVENOUS | Status: DC | PRN
  Administered 2017-11-06: 10 mg via INTRAVENOUS
  Filled 2017-11-06 (×2): qty 4

## 2017-11-06 MED ORDER — DICLOFENAC SODIUM 50 MG PO TBEC: 50.0000 mg | DELAYED_RELEASE_TABLET | Freq: Two times a day (BID) | ORAL | Status: DC

## 2017-11-06 MED ORDER — HYDROCHLOROTHIAZIDE 25 MG PO TABS
25.0000 mg | ORAL_TABLET | Freq: Every day | ORAL | Status: DC
  Administered 2017-11-07 – 2017-11-09 (×3): 25 mg via ORAL
  Filled 2017-11-06 (×3): qty 1

## 2017-11-06 MED ORDER — LIDOCAINE HCL (PF) 1 % IJ SOLN
INTRAMUSCULAR | Status: DC | PRN
  Administered 2017-11-06: 14 mL

## 2017-11-06 MED ORDER — HYDROMORPHONE HCL 1 MG/ML IJ SOLN: 0.5000 mg | INTRAMUSCULAR | Status: DC | PRN

## 2017-11-06 MED ORDER — ONDANSETRON HCL 4 MG/2ML IJ SOLN
INTRAMUSCULAR | Status: DC | PRN
  Administered 2017-11-06: 4 mg via INTRAVENOUS

## 2017-11-06 MED ORDER — FENTANYL CITRATE (PF) 250 MCG/5ML IJ SOLN
INTRAMUSCULAR | Status: DC | PRN
  Administered 2017-11-06 (×3): 50 ug via INTRAVENOUS

## 2017-11-06 MED ORDER — MIDAZOLAM HCL 2 MG/2ML IJ SOLN
INTRAMUSCULAR | Status: AC
  Filled 2017-11-06: qty 2

## 2017-11-06 MED ORDER — ASPIRIN EC 81 MG PO TBEC
81.0000 mg | DELAYED_RELEASE_TABLET | Freq: Two times a day (BID) | ORAL | Status: DC
  Administered 2017-11-06 – 2017-11-09 (×6): 81 mg via ORAL
  Filled 2017-11-06 (×6): qty 1

## 2017-11-06 MED ORDER — HEPARIN SODIUM (PORCINE) 5000 UNIT/ML IJ SOLN
5000.0000 [IU] | Freq: Three times a day (TID) | INTRAMUSCULAR | Status: DC
  Administered 2017-11-06 – 2017-11-09 (×10): 5000 [IU] via SUBCUTANEOUS
  Filled 2017-11-06 (×12): qty 1

## 2017-11-06 MED ORDER — METOPROLOL TARTRATE 100 MG PO TABS
100.0000 mg | ORAL_TABLET | Freq: Two times a day (BID) | ORAL | Status: DC
  Administered 2017-11-06 – 2017-11-09 (×6): 100 mg via ORAL
  Filled 2017-11-06 (×6): qty 1

## 2017-11-06 MED ORDER — POTASSIUM CHLORIDE CRYS ER 20 MEQ PO TBCR
20.0000 meq | EXTENDED_RELEASE_TABLET | Freq: Once | ORAL | Status: AC
  Administered 2017-11-06: 20 meq via ORAL
  Filled 2017-11-06: qty 1

## 2017-11-06 MED ORDER — METOPROLOL TARTRATE 5 MG/5ML IV SOLN
2.0000 mg | INTRAVENOUS | Status: DC | PRN
  Filled 2017-11-06: qty 5

## 2017-11-06 MED ORDER — ONDANSETRON HCL 4 MG/2ML IJ SOLN
INTRAMUSCULAR | Status: AC
  Filled 2017-11-06: qty 4

## 2017-11-06 MED ORDER — PHENYLEPHRINE 40 MCG/ML (10ML) SYRINGE FOR IV PUSH (FOR BLOOD PRESSURE SUPPORT)
PREFILLED_SYRINGE | INTRAVENOUS | Status: AC
  Filled 2017-11-06: qty 10

## 2017-11-06 MED ORDER — LISINOPRIL-HYDROCHLOROTHIAZIDE 20-25 MG PO TABS: 1.0000 | ORAL_TABLET | Freq: Every day | ORAL | Status: DC

## 2017-11-06 MED ORDER — PANTOPRAZOLE SODIUM 40 MG PO TBEC
40.0000 mg | DELAYED_RELEASE_TABLET | Freq: Every day | ORAL | Status: DC
  Administered 2017-11-06 – 2017-11-09 (×4): 40 mg via ORAL
  Filled 2017-11-06 (×4): qty 1

## 2017-11-06 MED ORDER — PROPOFOL 500 MG/50ML IV EMUL
INTRAVENOUS | Status: DC | PRN
  Administered 2017-11-06: 200 ug/kg/min via INTRAVENOUS

## 2017-11-06 MED ORDER — LISINOPRIL 20 MG PO TABS
20.0000 mg | ORAL_TABLET | Freq: Every day | ORAL | Status: DC
  Administered 2017-11-07 – 2017-11-09 (×3): 20 mg via ORAL
  Filled 2017-11-06 (×3): qty 1

## 2017-11-06 MED ORDER — DEXAMETHASONE SODIUM PHOSPHATE 10 MG/ML IJ SOLN
INTRAMUSCULAR | Status: AC
  Filled 2017-11-06: qty 2

## 2017-11-06 MED ORDER — ALUM & MAG HYDROXIDE-SIMETH 200-200-20 MG/5ML PO SUSP: 15.0000 mL | ORAL | Status: DC | PRN

## 2017-11-06 MED ORDER — HYDROMORPHONE HCL 2 MG/ML IJ SOLN
0.2500 mg | INTRAMUSCULAR | Status: DC | PRN
  Administered 2017-11-06 (×4): 0.5 mg via INTRAVENOUS

## 2017-11-06 MED ORDER — ROSUVASTATIN CALCIUM 10 MG PO TABS
20.0000 mg | ORAL_TABLET | Freq: Every evening | ORAL | Status: DC
  Administered 2017-11-06 – 2017-11-08 (×3): 20 mg via ORAL
  Filled 2017-11-06 (×3): qty 1

## 2017-11-06 MED ORDER — OXYCODONE-ACETAMINOPHEN 5-325 MG PO TABS
1.0000 | ORAL_TABLET | ORAL | Status: DC | PRN
  Administered 2017-11-06 – 2017-11-07 (×5): 1 via ORAL
  Filled 2017-11-06 (×4): qty 1

## 2017-11-06 MED ORDER — LIDOCAINE 2% (20 MG/ML) 5 ML SYRINGE
INTRAMUSCULAR | Status: AC
  Filled 2017-11-06: qty 5

## 2017-11-06 MED ORDER — OXYCODONE-ACETAMINOPHEN 5-325 MG PO TABS: 1.0000 | ORAL_TABLET | Freq: Four times a day (QID) | ORAL | Status: DC | PRN

## 2017-11-06 MED ORDER — GUAIFENESIN-DM 100-10 MG/5ML PO SYRP
15.0000 mL | ORAL_SOLUTION | ORAL | Status: DC | PRN
  Filled 2017-11-06: qty 15

## 2017-11-06 MED ORDER — ROCURONIUM BROMIDE 10 MG/ML (PF) SYRINGE
PREFILLED_SYRINGE | INTRAVENOUS | Status: AC
  Filled 2017-11-06: qty 5

## 2017-11-06 MED ORDER — METOPROLOL TARTRATE 50 MG PO TABS
ORAL_TABLET | ORAL | Status: AC
  Filled 2017-11-06: qty 2

## 2017-11-06 MED ORDER — LIDOCAINE HCL (PF) 1 % IJ SOLN
INTRAMUSCULAR | Status: AC
  Filled 2017-11-06: qty 30

## 2017-11-06 MED ORDER — SUCCINYLCHOLINE CHLORIDE 200 MG/10ML IV SOSY
PREFILLED_SYRINGE | INTRAVENOUS | Status: AC
  Filled 2017-11-06: qty 10

## 2017-11-06 MED ORDER — OXYCODONE-ACETAMINOPHEN 5-325 MG PO TABS
ORAL_TABLET | ORAL | Status: AC
  Filled 2017-11-06: qty 1

## 2017-11-06 MED ORDER — CHLORHEXIDINE GLUCONATE 4 % EX LIQD: 60.0000 mL | Freq: Once | CUTANEOUS | Status: DC

## 2017-11-06 MED ORDER — PROPOFOL 10 MG/ML IV BOLUS
INTRAVENOUS | Status: DC | PRN
  Administered 2017-11-06: 40 mg via INTRAVENOUS

## 2017-11-06 MED ORDER — MUPIROCIN 2 % EX OINT
1.0000 "application " | TOPICAL_OINTMENT | Freq: Once | CUTANEOUS | Status: AC
  Administered 2017-11-06: 1 via TOPICAL
  Filled 2017-11-06: qty 22

## 2017-11-06 MED ORDER — NITROGLYCERIN 0.4 MG SL SUBL: 0.4000 mg | SUBLINGUAL_TABLET | SUBLINGUAL | Status: DC | PRN

## 2017-11-06 MED ORDER — METFORMIN HCL 850 MG PO TABS
850.0000 mg | ORAL_TABLET | Freq: Two times a day (BID) | ORAL | Status: DC
  Administered 2017-11-06 – 2017-11-09 (×6): 850 mg via ORAL
  Filled 2017-11-06 (×7): qty 1

## 2017-11-06 MED ORDER — MIDAZOLAM HCL 2 MG/2ML IJ SOLN
INTRAMUSCULAR | Status: DC | PRN
  Administered 2017-11-06: 2 mg via INTRAVENOUS

## 2017-11-06 MED ORDER — FENTANYL CITRATE (PF) 250 MCG/5ML IJ SOLN
INTRAMUSCULAR | Status: AC
  Filled 2017-11-06: qty 5

## 2017-11-06 MED ORDER — CLOPIDOGREL BISULFATE 75 MG PO TABS
75.0000 mg | ORAL_TABLET | Freq: Every day | ORAL | Status: DC
  Administered 2017-11-07 – 2017-11-09 (×3): 75 mg via ORAL
  Filled 2017-11-06 (×3): qty 1

## 2017-11-06 MED ORDER — PHENOL 1.4 % MT LIQD: 1.0000 | OROMUCOSAL | Status: DC | PRN

## 2017-11-06 MED ORDER — METOPROLOL TARTRATE 25 MG PO TABS
100.0000 mg | ORAL_TABLET | Freq: Once | ORAL | Status: AC
  Administered 2017-11-06: 100 mg via ORAL

## 2017-11-06 MED ORDER — SODIUM CHLORIDE 0.9 % IV SOLN: INTRAVENOUS | Status: DC

## 2017-11-06 MED ORDER — ONDANSETRON HCL 4 MG/2ML IJ SOLN: 4.0000 mg | Freq: Four times a day (QID) | INTRAMUSCULAR | Status: DC | PRN

## 2017-11-06 MED ORDER — GABAPENTIN 300 MG PO CAPS
300.0000 mg | ORAL_CAPSULE | Freq: Two times a day (BID) | ORAL | Status: DC
  Administered 2017-11-06 – 2017-11-09 (×7): 300 mg via ORAL
  Filled 2017-11-06 (×8): qty 1

## 2017-11-06 MED ORDER — GLYCOPYRROLATE 0.2 MG/ML IV SOSY
PREFILLED_SYRINGE | INTRAVENOUS | Status: DC | PRN
  Administered 2017-11-06: .2 mg via INTRAVENOUS

## 2017-11-06 MED ORDER — HYDROMORPHONE HCL 2 MG/ML IJ SOLN
INTRAMUSCULAR | Status: AC
  Filled 2017-11-06: qty 1

## 2017-11-06 MED ORDER — LACTATED RINGERS IV SOLN
INTRAVENOUS | Status: DC
  Administered 2017-11-06: 10:00:00 via INTRAVENOUS

## 2017-11-06 MED ORDER — HYDRALAZINE HCL 20 MG/ML IJ SOLN
5.0000 mg | INTRAMUSCULAR | Status: DC | PRN
Start: 2017-11-06 — End: 2017-11-09
  Administered 2017-11-06: 5 mg via INTRAVENOUS
  Filled 2017-11-06: qty 1

## 2017-11-06 SURGICAL SUPPLY — 35 items
BANDAGE ACE 4X5 VEL STRL LF (GAUZE/BANDAGES/DRESSINGS) IMPLANT
BANDAGE ACE 6X5 VEL STRL LF (GAUZE/BANDAGES/DRESSINGS) IMPLANT
BANDAGE ELASTIC 3 VELCRO ST LF (GAUZE/BANDAGES/DRESSINGS) ×1 IMPLANT
BNDG GAUZE ELAST 4 BULKY (GAUZE/BANDAGES/DRESSINGS) ×1 IMPLANT
CANISTER SUCT 3000ML PPV (MISCELLANEOUS) ×2 IMPLANT
COVER SURGICAL LIGHT HANDLE (MISCELLANEOUS) ×2 IMPLANT
DRAPE EXTREMITY T 121X128X90 (DRAPE) ×1 IMPLANT
DRAPE HALF SHEET 40X57 (DRAPES) ×1 IMPLANT
DRAPE INCISE IOBAN 66X45 STRL (DRAPES) IMPLANT
DRAPE ORTHO SPLIT 77X108 STRL (DRAPES)
DRAPE SURG ORHT 6 SPLT 77X108 (DRAPES) IMPLANT
DRSG ADAPTIC 3X8 NADH LF (GAUZE/BANDAGES/DRESSINGS) IMPLANT
ELECT REM PT RETURN 9FT ADLT (ELECTROSURGICAL) ×2
ELECTRODE REM PT RTRN 9FT ADLT (ELECTROSURGICAL) ×1 IMPLANT
GAUZE SPONGE 4X4 12PLY STRL (GAUZE/BANDAGES/DRESSINGS) ×1 IMPLANT
GLOVE BIO SURGEON STRL SZ7.5 (GLOVE) ×2 IMPLANT
GOWN STRL REUS W/ TWL LRG LVL3 (GOWN DISPOSABLE) ×2 IMPLANT
GOWN STRL REUS W/ TWL XL LVL3 (GOWN DISPOSABLE) ×1 IMPLANT
GOWN STRL REUS W/TWL LRG LVL3 (GOWN DISPOSABLE) ×4
GOWN STRL REUS W/TWL XL LVL3 (GOWN DISPOSABLE) ×2
KIT BASIN OR (CUSTOM PROCEDURE TRAY) ×2 IMPLANT
KIT TURNOVER KIT B (KITS) ×2 IMPLANT
NS IRRIG 1000ML POUR BTL (IV SOLUTION) ×2 IMPLANT
PACK GENERAL/GYN (CUSTOM PROCEDURE TRAY) ×1 IMPLANT
PAD ARMBOARD 7.5X6 YLW CONV (MISCELLANEOUS) ×4 IMPLANT
SPONGE GAUZE 4X4 12PLY STER LF (GAUZE/BANDAGES/DRESSINGS) ×2 IMPLANT
SUT ETHILON 3 0 PS 1 (SUTURE) ×1 IMPLANT
SUT VIC AB 2-0 CT1 27 (SUTURE)
SUT VIC AB 2-0 CT1 TAPERPNT 27 (SUTURE) IMPLANT
SUT VIC AB 3-0 SH 27 (SUTURE)
SUT VIC AB 3-0 SH 27X BRD (SUTURE) IMPLANT
SUT VICRYL 4-0 PS2 18IN ABS (SUTURE) IMPLANT
TOWEL GREEN STERILE (TOWEL DISPOSABLE) ×4 IMPLANT
TOWEL GREEN STERILE FF (TOWEL DISPOSABLE) ×2 IMPLANT
WATER STERILE IRR 1000ML POUR (IV SOLUTION) ×1 IMPLANT

## 2017-11-06 NOTE — Transfer of Care (Signed)
Immediate Anesthesia Transfer of Care Note  Patient: Joel Hebert  Procedure(s) Performed: DEBRIDEMENT WOUND TOE BED OF FIFTH TOE LEFT FOOT (Left Foot)  Patient Location: PACU  Anesthesia Type:MAC  Level of Consciousness: awake, alert  and oriented  Airway & Oxygen Therapy: Patient Spontanous Breathing  Post-op Assessment: Report given to RN and Post -op Vital signs reviewed and stable  Post vital signs: Reviewed and stable  Last Vitals:  Vitals Value Taken Time  BP 126/70 11/06/2017 10:30 AM  Temp    Pulse 74 11/06/2017 10:30 AM  Resp 12 11/06/2017 10:30 AM  SpO2 100 % 11/06/2017 10:30 AM  Vitals shown include unvalidated device data.  Last Pain:  Vitals:   11/06/17 0927  TempSrc: Oral  PainSc:       Patients Stated Pain Goal: 2 (41/28/78 6767)  Complications: No apparent anesthesia complications

## 2017-11-06 NOTE — Progress Notes (Signed)
Orthopedic Tech Progress Note Patient Details:  Joel Hebert 1956-07-02 [id number] Patient already has shoe. Patient ID: Joel Hebert, male   DOB: 12/21/1955, 62 y.o.   MRN: [id number]   Joel Hebert 11/06/2017, 4:05 PM

## 2017-11-06 NOTE — Anesthesia Preprocedure Evaluation (Signed)
Anesthesia Evaluation  Patient identified by MRN, date of birth, ID band  Reviewed: Allergy & Precautions, NPO status , Patient's Chart, lab work & pertinent test results  Airway Mallampati: II  TM Distance: >3 FB     Dental   Pulmonary Current Smoker,    breath sounds clear to auscultation       Cardiovascular hypertension, + CAD, + Past MI and + Peripheral Vascular Disease   Rhythm:Regular Rate:Normal     Neuro/Psych    GI/Hepatic negative GI ROS, Neg liver ROS,   Endo/Other  diabetes  Renal/GU negative Renal ROS     Musculoskeletal   Abdominal   Peds  Hematology   Anesthesia Other Findings   Reproductive/Obstetrics                             Anesthesia Physical Anesthesia Plan  ASA: III  Anesthesia Plan: MAC   Post-op Pain Management:    Induction: Intravenous  PONV Risk Score and Plan: Treatment may vary due to age or medical condition  Airway Management Planned: Simple Face Mask and Nasal Cannula  Additional Equipment:   Intra-op Plan:   Post-operative Plan:   Informed Consent: I have reviewed the patients History and Physical, chart, labs and discussed the procedure including the risks, benefits and alternatives for the proposed anesthesia with the patient or authorized representative who has indicated his/her understanding and acceptance.   Dental advisory given  Plan Discussed with: CRNA and Anesthesiologist  Anesthesia Plan Comments:         Anesthesia Quick Evaluation

## 2017-11-06 NOTE — Op Note (Signed)
    Patient name: Oziah Vitanza MRN: [id number] DOB: July 30, 1955 Sex: male  11/06/2017 Pre-operative Diagnosis: Left fifth toe amputation site wound Post-operative diagnosis:  Same Surgeon:  Erlene Quan C. Donzetta Matters, MD Procedure Performed: Excisional debridement left fifth toe amputation site to 3 x 2 x 2 cm   Indications:62 year old male has undergone bilateral lower extremity endovascular interventions.  He is ultimately had a left fifth toe amputation for ongoing pain.  This wound has failed to heal primarily and is now indicated for debridement.  Findings: All tissue was debrided sharply and excised back to healthy bleeding tissue.  I completion the wound bed is free of disease and there is bleeding in the wound bed controlled cautery.   Procedure:  The patient was identified in the holding area and taken to the operating room where he was placed supine on the operating table and MAC anesthesia was induced.  He was sterilely prepped and draped in the left foot usual fashion he was given antibiotics and timeout was called.  We began with anesthetizing the left fifth toe area with 1% lidocaine.  We then used a combination of cautery and 15 blade to excise the necrotic tissue to a size of 3 x 2 x 2 cm total.  Bone was removed with rondure.  In total we excised skin soft tissue and bone.  I completion we had all healthy tissue with a wound to large to close.  I elected to pack this wound open.  He tolerated procedure well without immediate comp occasion.    EBL 10 cc.   Joye Wesenberg C. Donzetta Matters, MD [facility] [place] Office: [phone] Pager: [phone]

## 2017-11-06 NOTE — Anesthesia Postprocedure Evaluation (Signed)
Anesthesia Post Note  Patient: Joel Hebert  Procedure(s) Performed: DEBRIDEMENT WOUND TOE BED OF FIFTH TOE LEFT FOOT (Left Foot)     Patient location during evaluation: PACU Anesthesia Type: MAC Level of consciousness: awake Pain management: pain level controlled Vital Signs Assessment: post-procedure vital signs reviewed and stable Respiratory status: spontaneous breathing Cardiovascular status: stable Anesthetic complications: no    Last Vitals:  Vitals:   11/06/17 1200 11/06/17 1229  BP: (!) 176/91 (!) 168/85  Pulse: 67 67  Resp: 16 19  Temp: (!) 36.1 C 36.6 C  SpO2: 99% 96%    Last Pain:  Vitals:   11/06/17 1229  TempSrc: Oral  PainSc:                  [place]

## 2017-11-06 NOTE — H&P (Signed)
   History and Physical Update  The patient was interviewed and re-examined.  The patient's previous History and Physical has been reviewed and is unchanged from recent office visit. Plan to debride left 5ht toe amp site.   Madyson Lukach C. Randie Heinz, MD [facility] [place] Office: [phone] Pager: [phone]  11/06/2017, 9:34 AM

## 2017-11-06 NOTE — Anesthesia Procedure Notes (Signed)
Procedure Name: MAC Date/Time: 11/06/2017 9:57 AM Performed by: Imagene Riches, CRNA Pre-anesthesia Checklist: Patient identified, Emergency Drugs available, Suction available and Patient being monitored Patient Re-evaluated:Patient Re-evaluated prior to induction Oxygen Delivery Method: Simple face mask Preoxygenation: Pre-oxygenation with 100% oxygen

## 2017-11-07 ENCOUNTER — Encounter (HOSPITAL_COMMUNITY): Payer: Self-pay | Admitting: Vascular Surgery

## 2017-11-07 LAB — GLUCOSE, CAPILLARY
GLUCOSE-CAPILLARY: 144 mg/dL — AB (ref 65–99)
GLUCOSE-CAPILLARY: 178 mg/dL — AB (ref 65–99)
Glucose-Capillary: 152 mg/dL — ABNORMAL HIGH (ref 65–99)
Glucose-Capillary: 130 mg/dL — ABNORMAL HIGH (ref 65–99)

## 2017-11-07 LAB — BASIC METABOLIC PANEL
Anion gap: 6 (ref 5–15)
BUN: 17 mg/dL (ref 6–20)
CALCIUM: 8.9 mg/dL (ref 8.9–10.3)
CO2: 26 mmol/L (ref 22–32)
Chloride: 106 mmol/L (ref 101–111)
Creatinine, Ser: 1.11 mg/dL (ref 0.61–1.24)
GFR calc Af Amer: >60 mL/min (ref >60)
GLUCOSE: 145 mg/dL — AB (ref 65–99)
Potassium: 4.9 mmol/L (ref 3.5–5.1)
Sodium: 138 mmol/L (ref 135–145)

## 2017-11-07 LAB — CBC
HEMATOCRIT: 29.4 % — AB (ref 39.0–52.0)
Hemoglobin: 9.3 g/dL — ABNORMAL LOW (ref 13.0–17.0)
MCH: 27.4 pg (ref 26.0–34.0)
MCHC: 31.6 g/dL (ref 30.0–36.0)
MCV: 86.5 fL (ref 78.0–100.0)
PLATELETS: 245 10*3/uL (ref 150–400)
RBC: 3.4 MIL/uL — ABNORMAL LOW (ref 4.22–5.81)
RDW: 14.2 % (ref 11.5–15.5)
WBC: 7.3 10*3/uL (ref 4.0–10.5)

## 2017-11-07 MED ORDER — OXYCODONE-ACETAMINOPHEN 5-325 MG PO TABS: 1.0000 | ORAL_TABLET | ORAL | 0 refills | Status: DC | PRN

## 2017-11-07 MED ORDER — OXYCODONE-ACETAMINOPHEN 5-325 MG PO TABS
1.0000 | ORAL_TABLET | ORAL | Status: DC | PRN
  Administered 2017-11-07 – 2017-11-09 (×10): 2 via ORAL
  Filled 2017-11-07 (×11): qty 2

## 2017-11-07 MED ORDER — MORPHINE SULFATE (PF) 4 MG/ML IV SOLN
2.0000 mg | Freq: Once | INTRAVENOUS | Status: AC
  Administered 2017-11-07: 2 mg via INTRAMUSCULAR
  Filled 2017-11-07: qty 1

## 2017-11-07 NOTE — Discharge Summary (Signed)
Discharge Summary    Joel Hebert December 12, 1955 62 y.o. male  [id number]  Admission Date: 11/06/2017  Discharge Date: 11/09/17  Physician: Juventino Slovak*  Admission Diagnosis: NON HEALING WOUND TOE BED FIFTH TOE LEFT FOOT   HPI:   This is a 62 y.o. 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 Randie Heinz.  He presents today for follow up.  He states that he has been soaking his foot in epsom salts and it has taken a lot of the soreness away.  He has been putting dry dressing on this.  He is on Plavix daily.     Hospital Course:  The patient was admitted to the hospital and taken to the operating room on 11/06/2017 and underwent: Excisional debridement left fifth toe amputation site to 3 x 2 x 2 cm     Findings: All tissue was debrided sharply and excised back to healthy bleeding tissue.  I completion the wound bed is free of disease and there is bleeding in the wound bed controlled cautery.  The pt tolerated the procedure well and was transported to the PACU in good condition.   On POD 1, his dressing was changed and there was was mild bloody ooze from the wound bed and the tissue was clean with no evidence of infection.  WOC was consulted for wound vac placement.  A flat bottom shoe was also ordered for the pt.    Case management was ordered for the pt so that he could go home with a wound vac.  Given the pt does not have insurance and lives in [place], he does not qualify for the charity vac.  Social work was consulted for SNF placement.    Placement was approved on 11/09/17 and he is discharged.   The remainder of the hospital course consisted of increasing mobilization and increasing intake of solids without difficulty.  CBC    Component Value Date/Time   WBC 7.3 11/07/2017 0551   RBC 3.40 (L) 11/07/2017 0551   HGB 9.3 (L) 11/07/2017 0551   HCT 29.4 (L) 11/07/2017 0551   PLT 245 11/07/2017 0551   MCV 86.5 11/07/2017 0551   MCH 27.4 11/07/2017 0551   MCHC 31.6 11/07/2017 0551   RDW 14.2 11/07/2017 0551   LYMPHSABS 2.8 08/05/2017 0308   MONOABS 0.4 08/05/2017 0308   EOSABS 0.4 08/05/2017 0308   BASOSABS 0.1 08/05/2017 0308    BMET    Component Value Date/Time   NA 138 11/07/2017 0551   K 4.9 11/07/2017 0551   CL 106 11/07/2017 0551   CO2 26 11/07/2017 0551   GLUCOSE 145 (H) 11/07/2017 0551   BUN 17 11/07/2017 0551   CREATININE 1.11 11/07/2017 0551   CALCIUM 8.9 11/07/2017 0551   GFRNONAA >60 11/07/2017 0551   GFRAA >60 11/07/2017 0551        Discharge Diagnosis:  NON HEALING WOUND TOE BED FIFTH TOE LEFT FOOT  Secondary Diagnosis: Patient Active Problem List   Diagnosis Date Noted  . PAD (peripheral artery disease) (HCC) 08/14/2017   Past Medical History:  Diagnosis Date  . Coronary artery disease   . High cholesterol   . Hypertension   . MI (myocardial infarction) (HCC) 08/13/2014; 01/2016- 03/2016 X 3  . PAD (peripheral artery disease) (HCC)   . Peripheral vascular disease (HCC)   . Type II diabetes mellitus (HCC)      Allergies as of 11/07/2017   No Known Allergies  Medication List    TAKE these medications   acetaminophen 500 MG tablet Commonly known as:  TYLENOL Take 500 mg by mouth every 6 (six) hours as needed for mild pain or headache.   aspirin EC 81 MG tablet Take 81 mg by mouth 2 (two) times daily.   clopidogrel 75 MG tablet Commonly known as:  PLAVIX Take 1 tablet (75 mg total) by mouth daily with breakfast.   diclofenac 50 MG EC tablet Commonly known as:  VOLTAREN Take 1 tablet (50 mg total) by mouth 2 (two) times daily.   gabapentin 300 MG capsule Commonly known as:  NEURONTIN Take 1 capsule (300 mg total) by mouth 2 (two) times daily.   lisinopril-hydrochlorothiazide 20-25 MG tablet Commonly known as:  PRINZIDE,ZESTORETIC Take 1 tablet by mouth daily.   metFORMIN 850 MG tablet Commonly known as:  GLUCOPHAGE Take 850 mg by mouth 2 (two) times daily with a meal.   metoprolol  tartrate 100 MG tablet Commonly known as:  LOPRESSOR Take 100 mg by mouth 2 (two) times daily.   nitroGLYCERIN 0.4 MG SL tablet Commonly known as:  NITROSTAT Place 0.4 mg under the tongue every 5 (five) minutes as needed for chest pain.   oxyCODONE-acetaminophen 5-325 MG tablet Commonly known as:  PERCOCET/ROXICET Take 1 tablet by mouth every 4 (four) hours as needed for severe pain. What changed:  Another medication with the same name was removed. Continue taking this medication, and follow the directions you see here.   rosuvastatin 20 MG tablet Commonly known as:  CRESTOR Take 20 mg by mouth every evening.       Prescriptions given: Roxicet #30 No Refill  Instructions: [facility]  Discharge instructions   Please refer to the following instruction for your post-procedure care. Your surgeon or physician assistant will discuss any changes with you.  Activity  You can slowly return to normal activities during the month after your surgery. Avoid strenuous activity and heavy lifting until your doctor tells you it's OK.  Do not drive until your doctor give the OK and you are no longer taking prescription pain medications. It is also normal to have difficulty with sleep habits, eating and bowel movement after surgery. These will go away with time. Heel weight bearing with post op shoe.   Bathing/Showering  You may shower after you go home. Do not soak in a bathtub, hot tub, or swim until the incision heals completely.  Incision Care  You will have a wound vac that will be changed on Monday, Wednesday and Friday starting on 11/09/17.    Diet  Resume your normal diet. There are no special food restrictions following this procedure. A low fat/ low cholesterol diet is recommended for all patients with vascular disease. In order to heal from your surgery, it is CRITICAL to get adequate nutrition. Your body requires vitamins, minerals, and protein.  Vegetables are the best source of vitamins and minerals. Vegetables also provide the perfect balance of protein. Processed food has little nutritional value, so try to avoid this.  Medications  Resume taking all your medications unless your doctor or nurse practitioner tells you not to. If your incision is causing pain, you may take over-the-counter pain relievers such as acetaminophen (Tylenol). If you were prescribed a stronger pain medication, please aware these medication can cause nausea and constipation. Prevent nausea by taking the medication with a snack or meal. Avoid constipation by drinking plenty of fluids and eating foods with  high amount of fiber, such as fruits, vegetables, and grains. Take Colace 100 mg (an over-the-counter stool softener) twice a day as needed for constipation. Do not take Tylenol if you are taking prescription pain medications.  Follow Up  Our office will schedule a follow up appointment 3-4 weeks following discharge.  Please call us immediately for any of the following conditions  -Increase pain, redness, warmth, or drainage (pus) from your incision site(s) -Fever of 101 degree or higher  Reduce your risk of vascular disease  Stop smoking. If you would like help call [facility] at 1-800-QUIT-NOW ((906)680-2089) or [place] at [phone].  Manage your cholesterol Maintain a desired weight Control your diabetes weight Control your diabetes Keep your blood pressure down  If you have any questions, please call the office at [phone]  Disposition: SNF  Patient's condition: is Good  Follow up: 1. Dr. Randie Heinz in 3-4 weeks   Doreatha Massed, PA-C [facility] [phone] 11/07/2017  8:26 AM

## 2017-11-07 NOTE — Consult Note (Signed)
WOC Nurse wound consult note Reason for Consult: NPWT to left 5th toe surgical site Area measures 4 cm x 1.5 cm x 2.5 cm, beefy red.  Patient was medicated for pain by primary RN prior to placement of one piece of black foam.  "No leak" seal obtained.  Patient tolerated procedure very well. Home VAC to continue on a MWF schedule. Helmut Muster, RN, MSN, CWOCN, CNS-BC, pager [phone]

## 2017-11-07 NOTE — Progress Notes (Addendum)
  Progress Note    11/07/2017 7:51 AM 1 Day Post-Op  Subjective:  Says he is having pain  Tm 99 VSS 99% RA  Vitals:   11/07/17 0354 11/07/17 0732  BP: (!) 150/75 (!) 152/76  Pulse: 82 78  Resp: 16 19  Temp: 98.5 F (36.9 C) 99 F (37.2 C)  SpO2: 100% 99%    Physical Exam: General:  No distress Lungs:  Non labored Incisions:  Wound is clean with mild bloody ooze from wound bed  CBC    Component Value Date/Time   WBC 7.3 11/07/2017 0551   RBC 3.40 (L) 11/07/2017 0551   HGB 9.3 (L) 11/07/2017 0551   HCT 29.4 (L) 11/07/2017 0551   PLT 245 11/07/2017 0551   MCV 86.5 11/07/2017 0551   MCH 27.4 11/07/2017 0551   MCHC 31.6 11/07/2017 0551   RDW 14.2 11/07/2017 0551   LYMPHSABS 2.8 08/05/2017 0308   MONOABS 0.4 08/05/2017 0308   EOSABS 0.4 08/05/2017 0308   BASOSABS 0.1 08/05/2017 0308    BMET    Component Value Date/Time   NA 138 11/07/2017 0551   K 4.9 11/07/2017 0551   CL 106 11/07/2017 0551   CO2 26 11/07/2017 0551   GLUCOSE 145 (H) 11/07/2017 0551   BUN 17 11/07/2017 0551   CREATININE 1.11 11/07/2017 0551   CALCIUM 8.9 11/07/2017 0551   GFRNONAA >60 11/07/2017 0551   GFRAA >60 11/07/2017 0551    INR No results found for: INR   Intake/Output Summary (Last 24 hours) at 11/07/2017 0751 Last data filed at 11/06/2017 1703 Gross per 24 hour  Intake 760 ml  Output 810 ml  Net -50 ml     Assessment:  62 y.o. male is s/p:  Excisional debridement left fifth toe amputation site to 3 x 2 x 2 cm   1 Day Post-Op  Plan: -pt wound is clean with mild bloody ooze from wound bed.  Pt tolerated wet to dry dressing but painful.  Will order WOC for wound vac placement.  Will need IM morphine given his IV has been removed.  -change percocet from one q4h to 1-2 q4h -case management for home vac.  Hopeful to discharge home today if vac approved.  Face to face and HH orders placed. -pt will wear flat bottom shoe -Dr. Randie Heinz discussed with pt that if this does not heal,  he may need transmetatarsal amputation.     Doreatha Massed, PA-C [facility] [phone] 11/07/2017 7:51 AM    I have interviewed and examined patient with PA and agree with assessment and plan above.   Jajuan Skoog C. Randie Heinz, MD [facility] [place] Office: [phone] Pager: [phone]

## 2017-11-07 NOTE — Care Management Note (Signed)
Case Management Note  Patient Details  Name: Joel Hebert MRN: [id number] Date of Birth: 02-Mar-1956  Subjective/Objective:   Excisional debridement left fifth toe amputation site to 3 x 2 x 2 cm                 Action/Plan:  NCM spoke to pt and states he does not have insurance. He received disability but has not applied for Medicaid. Sent referral to [facility]. Pt states he has crutches at home. Contacted [facility] for [facility] and PT. Faxed orders, WOC note, progress notes, and Medicaid and charity application to [facility] for wound vac. Explained [facility] will deliver wound vac to room prior to dc.     Expected Discharge Date:  11/07/17               Expected Discharge Plan:  Home w Home Health Services  In-House Referral:  NA  Discharge planning Services  CM Consult  Post Acute Care Choice:  Home Health Choice offered to:  Patient  DME Arranged:  Vac DME Agency:  [facility]  HH Arranged:  PT, RN HH Agency:  [facility]  Status of Service:  Completed, signed off  If discussed at Long Length of Stay Meetings, dates discussed:    Additional Comments:  Elliot Cousin, RN 11/07/2017, 4:43 PM

## 2017-11-07 NOTE — Discharge Instructions (Signed)
° °  [facility]  Discharge instructions   Please refer to the following instruction for your post-procedure care. Your surgeon or physician assistant will discuss any changes with you.  Activity  You can slowly return to normal activities during the month after your surgery. Avoid strenuous activity and heavy lifting until your doctor tells you it's OK.  Do not drive until your doctor give the OK and you are no longer taking prescription pain medications. It is also normal to have difficulty with sleep habits, eating and bowel movement after surgery. These will go away with time. Heel weight bearing with post op shoe.   Bathing/Showering  You may shower after you go home. Do not soak in a bathtub, hot tub, or swim until the incision heals completely.  Incision Care  Clean your incision with mild soap and water. Shower every day. Pat the area dry with a clean towel. You will have a wound vac that will be changed on Monday, Wednesday and Friday starting on 11/09/17 by a home health nurse.  Diet  Resume your normal diet. There are no special food restrictions following this procedure. A low fat/ low cholesterol diet is recommended for all patients with vascular disease. In order to heal from your surgery, it is CRITICAL to get adequate nutrition. Your body requires vitamins, minerals, and protein. Vegetables are the best source of vitamins and minerals. Vegetables also provide the perfect balance of protein. Processed food has little nutritional value, so try to avoid this.  Medications  Resume taking all your medications unless your doctor or nurse practitioner tells you not to. If your incision is causing pain, you may take over-the-counter pain relievers such as acetaminophen (Tylenol). If you were prescribed a stronger pain medication, please aware these medication can cause nausea and constipation. Prevent nausea by taking the medication with a snack or meal.  Avoid constipation by drinking plenty of fluids and eating foods with high amount of fiber, such as fruits, vegetables, and grains. Take Colace 100 mg (an over-the-counter stool softener) twice a day as needed for constipation. Do not take Tylenol if you are taking prescription pain medications.  Follow Up  Our office will schedule a follow up appointment 3-4 weeks following discharge.  Please call us immediately for any of the following conditions  -Increase pain, redness, warmth, or drainage (pus) from your incision site(s) -Fever of 101 degree or higher  Reduce your risk of vascular disease  Stop smoking. If you would like help call [facility] at 1-800-QUIT-NOW ((718)735-1739) or [place] at ([phone])[phone].  Manage your cholesterol Maintain a desired weight Control your diabetes weight Control your diabetes Keep your blood pressure down  If you have any questions, please call the office at [phone]

## 2017-11-08 DIAGNOSIS — Z7984 Long term (current) use of oral hypoglycemic drugs: Secondary | ICD-10-CM | POA: Diagnosis not present

## 2017-11-08 DIAGNOSIS — Z791 Long term (current) use of non-steroidal anti-inflammatories (NSAID): Secondary | ICD-10-CM | POA: Diagnosis not present

## 2017-11-08 DIAGNOSIS — I252 Old myocardial infarction: Secondary | ICD-10-CM | POA: Diagnosis not present

## 2017-11-08 DIAGNOSIS — Z7902 Long term (current) use of antithrombotics/antiplatelets: Secondary | ICD-10-CM | POA: Diagnosis not present

## 2017-11-08 DIAGNOSIS — T8781 Dehiscence of amputation stump: Secondary | ICD-10-CM | POA: Diagnosis present

## 2017-11-08 DIAGNOSIS — I739 Peripheral vascular disease, unspecified: Secondary | ICD-10-CM | POA: Diagnosis present

## 2017-11-08 DIAGNOSIS — E7800 Pure hypercholesterolemia, unspecified: Secondary | ICD-10-CM | POA: Diagnosis present

## 2017-11-08 DIAGNOSIS — I10 Essential (primary) hypertension: Secondary | ICD-10-CM | POA: Diagnosis present

## 2017-11-08 DIAGNOSIS — I2510 Atherosclerotic heart disease of native coronary artery without angina pectoris: Secondary | ICD-10-CM | POA: Diagnosis present

## 2017-11-08 DIAGNOSIS — Z7982 Long term (current) use of aspirin: Secondary | ICD-10-CM | POA: Diagnosis not present

## 2017-11-08 DIAGNOSIS — F17200 Nicotine dependence, unspecified, uncomplicated: Secondary | ICD-10-CM | POA: Diagnosis present

## 2017-11-08 DIAGNOSIS — Y838 Other surgical procedures as the cause of abnormal reaction of the patient, or of later complication, without mention of misadventure at the time of the procedure: Secondary | ICD-10-CM | POA: Diagnosis present

## 2017-11-08 DIAGNOSIS — E1151 Type 2 diabetes mellitus with diabetic peripheral angiopathy without gangrene: Secondary | ICD-10-CM | POA: Diagnosis present

## 2017-11-08 LAB — GLUCOSE, CAPILLARY
GLUCOSE-CAPILLARY: 117 mg/dL — AB (ref 65–99)
GLUCOSE-CAPILLARY: 126 mg/dL — AB (ref 65–99)
Glucose-Capillary: 118 mg/dL — ABNORMAL HIGH (ref 65–99)

## 2017-11-08 MED ORDER — HYDROMORPHONE HCL 2 MG/ML IJ SOLN
0.5000 mg | INTRAMUSCULAR | Status: DC | PRN
Start: 2017-11-08 — End: 2017-11-09
  Filled 2017-11-08: qty 1

## 2017-11-08 NOTE — Care Management (Signed)
Case manager has spoken with patient concerning discharge plan. Patient says he will not be able to go to his ex-wife's home in [place] as he thought, wants to go to his place in [place], [place]. Case manager contacted several HH agencies in [place]: [facility], [facility], [facility] , none of which provide charity care. Case Manager notified Samantha Rhyne,PA and Dr. Randie Heinz of difficulty arranging Home Health. Social worker is contacted to pursue SNF placement. Case manager has call out to [facility], should they be able to provide RN for [facility] dressing changes CM will notify SW and MD.

## 2017-11-08 NOTE — Progress Notes (Addendum)
  Progress Note    11/08/2017 7:38 AM 2 Days Post-Op  Subjective:  Says his pain is a little better  Afebrile   Vitals:   11/07/17 2327 11/08/17 0353  BP: 140/70 140/77  Pulse: 77 68  Resp: 18 20  Temp: 98.5 F (36.9 C) 98.5 F (36.9 C)  SpO2: 100% 100%    Physical Exam: General:  No distress Lungs:  Non labored   CBC    Component Value Date/Time   WBC 7.3 11/07/2017 0551   RBC 3.40 (L) 11/07/2017 0551   HGB 9.3 (L) 11/07/2017 0551   HCT 29.4 (L) 11/07/2017 0551   PLT 245 11/07/2017 0551   MCV 86.5 11/07/2017 0551   MCH 27.4 11/07/2017 0551   MCHC 31.6 11/07/2017 0551   RDW 14.2 11/07/2017 0551   LYMPHSABS 2.8 08/05/2017 0308   MONOABS 0.4 08/05/2017 0308   EOSABS 0.4 08/05/2017 0308   BASOSABS 0.1 08/05/2017 0308    BMET    Component Value Date/Time   NA 138 11/07/2017 0551   K 4.9 11/07/2017 0551   CL 106 11/07/2017 0551   CO2 26 11/07/2017 0551   GLUCOSE 145 (H) 11/07/2017 0551   BUN 17 11/07/2017 0551   CREATININE 1.11 11/07/2017 0551   CALCIUM 8.9 11/07/2017 0551   GFRNONAA >60 11/07/2017 0551   GFRAA >60 11/07/2017 0551    INR No results found for: INR   Intake/Output Summary (Last 24 hours) at 11/08/2017 0738 Last data filed at 11/07/2017 4098 Gross per 24 hour  Intake 240 ml  Output -  Net 240 ml     Assessment:  62 y.o. male is s/p:  Excisional debridement left fifth toe amputation site   2 Days Post-Op  Plan: -pt had wound vac placed yesterday and now waiting for supplies to be brought to room -pt's pain is a little better today -continue vac change M/W/F with [facility] -discharge home once HH and vac have been set up and supplied.   Doreatha Massed, PA-C [facility] [phone] 11/08/2017 7:38 AM  I have independently interviewed and examined patient and agree with PA assessment and plan above.   Nur Krasinski C. Randie Heinz, MD [facility] [place] Office: [phone] Pager: [phone]

## 2017-11-08 NOTE — Progress Notes (Signed)
CSW consulted with RNCM and CSW clinical supervisor. Plan for patient to return home with LOG for wound vac (pending cost of vac) and for nursing care for vac. CSW signing off at this time. Will re-consult if plan changes and patient does need SNF. RNCM to follow for home health.  Abigail Butts, LCSWA [phone]

## 2017-11-08 NOTE — Progress Notes (Signed)
Patient is transferred to unit 5N28 at this time. Alert and in stable condition. Transported via wheelchair with wound vac in place and all belongings at side.

## 2017-11-08 NOTE — Clinical Social Work Note (Signed)
Clinical Social Work Assessment  Patient Details  Name: Joel Hebert MRN: [id number] Date of Birth: 12/15/1955  Date of referral:  11/08/17               Reason for consult:  Facility Placement                Permission sought to share information with:  Facility Contact Representative Permission granted to share information::  Yes, Verbal Permission Granted  Name::        Agency::  SNFs  Relationship::     Contact Information:     Housing/Transportation Living arrangements for the past 2 months:  Single Family Home Source of Information:  Patient Patient Interpreter Needed:  None Criminal Activity/Legal Involvement Pertinent to Current Situation/Hospitalization:  No - Comment as needed Significant Relationships:  Parents, Other Family Members Lives with:  Parents(father) Do you feel safe going back to the place where you live?  Yes Need for family participation in patient care:  No (Coment)  Care giving concerns: Patient from home with his father in [place], [place]. Patient had toe amputation this admission and requires wound vac. Patient is uninsured. Charity care unavailable in [place] for patient[place] wound vac. CSW consulted for SNF placement.  Social Worker assessment / plan: CSW discussed patient situation with RN and with RNCM. RNCM has been following and no charity care available in [place] for patient's wound vac. [facility] wound vac not indicated for patient. Patient was previously with his wife in [place], but they split up and patient moved to [place]. Patient cannot return to his ex, so does not have a place in [place] he can stay to utilize [facility] home health charity care.  CSW met with patient at bedside. Patient alert and oriented. CSW discussed disposition planning. CSW inquired if patient has any other family or friends in [place] he can stay with while using the wound vac. Patient stated he has no one else he can stay with. CSW discussed the possibility of placing patient in SNF, though LOG  will have to be approved by CSW clinical supervisor. Lack of insurance will also limit patient's facility options. Patient is agreeable to SNF if it can be arranged.   Patient's Medicaid application has not been started, as patient was in observation status and had planned discharge to home. Patient stated he does not have a bank account and doe not own any property, besides his old car.  CSW consulting with clinical supervisor regarding patient's discharge plan. CSW to follow.  Employment status:  Unemployed Insurance information:  Self Pay (Medicaid Pending) PT Recommendations:  Not assessed at this time Information / Referral to community resources:  Skilled Nursing Facility  Patient/Family's Response to care: Patient appreciative of care.  Patient/Family's Understanding of and Emotional Response to Diagnosis, Current Treatment, and Prognosis: Patient agreeable to SNF if it can be arranged.  Emotional Assessment Appearance:  Appears stated age Attitude/Demeanor/Rapport:  Engaged Affect (typically observed):  Calm Orientation:  Oriented to Self, Oriented to Place, Oriented to  Time, Oriented to Situation Alcohol / Substance use:  Not Applicable Psych involvement (Current and /or in the community):  No (Comment)  Discharge Needs  Concerns to be addressed:  Discharge Planning Concerns, Care Coordination Readmission within the last 30 days:  Yes Current discharge risk:  Physical Impairment Barriers to Discharge:  No SNF bed, Inadequate or no insurance   Susan Porter, LCSW 11/08/2017, 2:31 PM  

## 2017-11-09 LAB — GLUCOSE, CAPILLARY
GLUCOSE-CAPILLARY: 126 mg/dL — AB (ref 65–99)
GLUCOSE-CAPILLARY: 179 mg/dL — AB (ref 65–99)
Glucose-Capillary: 106 mg/dL — ABNORMAL HIGH (ref 65–99)

## 2017-11-09 MED ORDER — MORPHINE SULFATE (PF) 2 MG/ML IV SOLN
2.0000 mg | Freq: Once | INTRAVENOUS | Status: AC
  Administered 2017-11-09: 2 mg via INTRAMUSCULAR
  Filled 2017-11-09: qty 1

## 2017-11-09 NOTE — Progress Notes (Signed)
Pt discharged to SNF via stretcher with transporters. All belongings sent with patient. All paperwork, including scripts, sent with transporters.

## 2017-11-09 NOTE — Social Work (Addendum)
CSW received call back from [facility], [address] of [place] and [address]  [facility] [place] would need to order [facility] wound vac and they do not have on site.  CSW then called [facility] [place] and [place] and they will check if they have wound vac on site. CSW will await to hear back.    11:52am: [facility] [place] and [address] confirmed that they have wound vac.  CSW will f/u with patient on SNF offers.  Keene Breath, LCSW Clinical Social Worker [phone]

## 2017-11-09 NOTE — Care Management (Signed)
Case manager provided Joel Hebert with Rental cost provided by [facility] for [facility]: $1120.67 total cost includes rental per day, canisters and dressings. [employer] is requesting payment upfront. This is not possible due to the process of securing a PO etc. Patient will need shortterm SNF. Wandra Mannan, AD has agreed to [facility] for placement. Social worker will be notified.      11/08/17 - 3:30pm Case manager was not able to secure charity wound vac, patient has few dollars over income limit. Case manager did speak with Kara Mead, Director of [facility]-[phone], she was agreeable to providing Home Health RN with LOG.

## 2017-11-09 NOTE — Progress Notes (Signed)
Reported attempted again at [facility], no one available to take report, will try again as able.

## 2017-11-09 NOTE — Progress Notes (Signed)
Report attempted at [facility], no one available to take report, will try again as able.

## 2017-11-09 NOTE — Social Work (Addendum)
Pt approved for LOG for SNF placement for Wound Vac Care per [facility]. CSW sent referral to SNF's to see if they will take LOG for placement.  CSW sent referral to : [facility], [address], [facility], [place], [place], [address] of [place] and [place]. [facility] declined as they do not have any beds.  CSW will f/u for placement.  Keene Breath, LCSW Clinical Social Worker [phone]

## 2017-11-09 NOTE — Clinical Social Work Placement (Signed)
   CLINICAL SOCIAL WORK PLACEMENT  NOTE  Date:  11/09/2017  Patient Details  Name: Joel Hebert MRN: [id number] Date of Birth: 07-24-1955  Clinical Social Work is seeking post-discharge placement for this patient at the Skilled  Nursing Facility level of care (*CSW will initial, date and re-position this form in  chart as items are completed):  Yes   Patient/family provided with [facility] Clinical Social Work Department's list of facilities offering this level of care within the geographic area requested by the patient (or if unable, by the patient's family).  Yes   Patient/family informed of their freedom to choose among providers that offer the needed level of care, that participate in Medicare, Medicaid or managed care program needed by the patient, have an available bed and are willing to accept the patient.  Yes   Patient/family informed of [place]'s ownership interest in [facility] and [facility], as well as of the fact that they are under no obligation to receive care at these facilities.  PASRR submitted to EDS on       PASRR number received on 11/09/17     Existing PASRR number confirmed on       FL2 transmitted to all facilities in geographic area requested by pt/family on 11/09/17     FL2 transmitted to all facilities within larger geographic area on       Patient informed that his/her managed care company has contracts with or will negotiate with certain facilities, including the following:        Yes   Patient/family informed of bed offers received.  Patient chooses bed at [facility]     Physician recommends and patient chooses bed at      Patient to be transferred to [facility] on 11/09/17.  Patient to be transferred to facility by [facility]     Patient family notified on 11/09/17 of transfer.  Name of family member notified:  pt responsible for self     PHYSICIAN        Additional Comment:    _______________________________________________ Tresa Moore, LCSW 11/09/2017, 12:47 PM

## 2017-11-09 NOTE — NC FL2 (Addendum)
[place] MEDICAID FL2 LEVEL OF CARE SCREENING TOOL     IDENTIFICATION  Patient Name: Joel Hebert Birthdate: 03-17-1956 Sex: male Admission Date (Current Location): 11/06/2017  [facility] and [place] Number:  Producer, television/film/video and Address:  [facility] [place], [address], [place], [place] [address]      Provider Number: [id number]  Attending Physician Name and Address:  Juventino Slovak*  Relative Name and Phone Number:  Thanh Mottern, spouse, [phone]    Current Level of Care: Hospital Recommended Level of Care: Skilled Nursing Facility Prior Approval Number:    Date Approved/Denied:   PASRR Number: [id number]  Discharge Plan: SNF    Current Diagnoses: Patient Active Problem List   Diagnosis Date Noted  . Peripheral artery disease (HCC) 11/08/2017  . PAD (peripheral artery disease) (HCC) 08/14/2017    Orientation RESPIRATION BLADDER Height & Weight     Self, Time, Situation, Place  Normal Continent Weight: 200 lb (90.7 kg) Height:   (172.7 cm)  BEHAVIORAL SYMPTOMS/MOOD NEUROLOGICAL BOWEL NUTRITION STATUS      Continent Diet(please see DC summary)  AMBULATORY STATUS COMMUNICATION OF NEEDS Skin   Supervision Verbally Surgical wounds, Wound Vac(closed incision L foot; negative pressure wound therapy)                       Personal Care Assistance Level of Assistance  Bathing, Feeding, Dressing Bathing Assistance: Limited assistance Feeding assistance: Independent Dressing Assistance: Limited assistance     Functional Limitations Info  Sight, Hearing, Speech Sight Info: Adequate Hearing Info: Adequate Speech Info: Adequate    SPECIAL CARE FACTORS FREQUENCY                       Contractures Contractures Info: Not present    Additional Factors Info  Code Status, Allergies Code Status Info: Full Allergies Info: No Known Allergies           Current Medications (11/09/2017):  This is the  current hospital active medication list Current Facility-Administered Medications  Medication Dose Route Frequency Provider Last Rate Last Dose  . alum & mag hydroxide-simeth (MAALOX/MYLANTA) 200-200-20 MG/5ML suspension 15-30 mL  15-30 mL Oral Q2H PRN Maeola Harman, MD      . aspirin EC tablet 81 mg  81 mg Oral BID Maeola Harman, MD   81 mg at 11/09/17 8469  . clopidogrel (PLAVIX) tablet 75 mg  75 mg Oral Q breakfast Maeola Harman, MD   75 mg at 11/09/17 6295  . gabapentin (NEURONTIN) capsule 300 mg  300 mg Oral BID Maeola Harman, MD   300 mg at 11/09/17 0836  . guaiFENesin-dextromethorphan (ROBITUSSIN DM) 100-10 MG/5ML syrup 15 mL  15 mL Oral Q4H PRN Maeola Harman, MD      . heparin injection 5,000 Units  5,000 Units Subcutaneous Q8H Maeola Harman, MD   5,000 Units at 11/09/17 [phone]  . hydrALAZINE (APRESOLINE) injection 5 mg  5 mg Intravenous Q20 Min PRN Maeola Harman, MD   5 mg at 11/06/17 1403  . lisinopril (PRINIVIL,ZESTRIL) tablet 20 mg  20 mg Oral Daily Maeola Harman, MD   20 mg at 11/09/17 3244   And  . hydrochlorothiazide (HYDRODIURIL) tablet 25 mg  25 mg Oral Daily Maeola Harman, MD   25 mg at 11/09/17 0102  . HYDROmorphone (DILAUDID) injection 0.5-1 mg  0.5-1 mg Intravenous Q2H PRN Hammons, Gerhard Munch,  RPH      . labetalol (NORMODYNE,TRANDATE) injection 10 mg  10 mg Intravenous Q10 min PRN Maeola Harman, MD   10 mg at 11/06/17 2312  . metFORMIN (GLUCOPHAGE) tablet 850 mg  850 mg Oral BID WC Maeola Harman, MD   850 mg at 11/09/17 0837  . metoprolol tartrate (LOPRESSOR) injection 2-5 mg  2-5 mg Intravenous Q2H PRN Maeola Harman, MD      . metoprolol tartrate (LOPRESSOR) tablet 100 mg  100 mg Oral BID Maeola Harman, MD   100 mg at 11/09/17 9604  . nitroGLYCERIN (NITROSTAT) SL tablet 0.4 mg  0.4 mg Sublingual Q5 min PRN Maeola Harman, MD      . ondansetron [facility]) injection 4 mg  4 mg Intravenous Q6H PRN Maeola Harman, MD      . oxyCODONE-acetaminophen (PERCOCET/ROXICET) 5-325 MG per tablet 1-2 tablet  1-2 tablet Oral Q4H PRN Dara Lords, PA-C   2 tablet at 11/09/17 0837  . pantoprazole (PROTONIX) EC tablet 40 mg  40 mg Oral Daily Maeola Harman, MD   40 mg at 11/09/17 5409  . phenol (CHLORASEPTIC) mouth spray 1 spray  1 spray Mouth/Throat PRN Maeola Harman, MD      . rosuvastatin (CRESTOR) tablet 20 mg  20 mg Oral QPM Maeola Harman, MD   20 mg at 11/08/17 1754     Discharge Medications: Please see discharge summary for a list of discharge medications.  Relevant Imaging Results:  Relevant Lab Results:   Additional Information SSN: [id number]  Tresa Moore, LCSW

## 2017-11-09 NOTE — Progress Notes (Signed)
Report called to [place], nurse at [facility].

## 2017-11-09 NOTE — Social Work (Signed)
CSW met with patient at bedside to discuss SNF placement and discuss offers. Pt accepted SNF bed offer form [facility]. CSW confirmed with SNF. SNf will complete admission paperwork at bedside.  CSW completed LOG letter for placement. CSW awaiting for dc summary and will get patient to SNF today.  CSW f/u for disposition.  Elissa Hefty, LCSW Clinical Social Worker [phone]

## 2017-11-09 NOTE — Progress Notes (Signed)
  Progress Note    11/09/2017 8:30 AM 3 Days Post-Op  Subjective: Wants to go home  Vitals:   11/08/17 2100 11/09/17 0444  BP: 127/65 (!) 156/77  Pulse: 79 69  Resp: 20 (!) 22  Temp: 99.5 F (37.5 C) 98.2 F (36.8 C)  SpO2: 96% 99%    Physical Exam: Awake alert and oriented Right foot wound VAC in place and foot appears well perfused  CBC    Component Value Date/Time   WBC 7.3 11/07/2017 0551   RBC 3.40 (L) 11/07/2017 0551   HGB 9.3 (L) 11/07/2017 0551   HCT 29.4 (L) 11/07/2017 0551   PLT 245 11/07/2017 0551   MCV 86.5 11/07/2017 0551   MCH 27.4 11/07/2017 0551   MCHC 31.6 11/07/2017 0551   RDW 14.2 11/07/2017 0551   LYMPHSABS 2.8 08/05/2017 0308   MONOABS 0.4 08/05/2017 0308   EOSABS 0.4 08/05/2017 0308   BASOSABS 0.1 08/05/2017 0308    BMET    Component Value Date/Time   NA 138 11/07/2017 0551   K 4.9 11/07/2017 0551   CL 106 11/07/2017 0551   CO2 26 11/07/2017 0551   GLUCOSE 145 (H) 11/07/2017 0551   BUN 17 11/07/2017 0551   CREATININE 1.11 11/07/2017 0551   CALCIUM 8.9 11/07/2017 0551   GFRNONAA >60 11/07/2017 0551   GFRAA >60 11/07/2017 0551    INR No results found for: INR   Intake/Output Summary (Last 24 hours) at 11/09/2017 0830 Last data filed at 11/09/2017 0500 Gross per 24 hour  Intake 360 ml  Output 1800 ml  Net -1440 ml     Assessment:  62 y.o. male is status post left fifth toe amputation now with wound VAC placement.  Plan: Pending placement with wound VAC Wound VAC to be changed today.  Annaston Upham C. Randie Heinz, MD [facility] Office: [phone] Pager: [phone]  11/09/2017 8:30 AM

## 2017-11-09 NOTE — Consult Note (Signed)
WOC Nurse wound consult note Reason for Consult: Left foot surgical site at 5th toe VAC change Area measures 4.2 cm x 2 cm x 2 cm. Granulation tissue present; no drainage in the VAC cannister.  Approximately 40% with a thin yellow slough coating the wound bed.  To the left lateral area of the wound opening there is a discolored area that is yellow/grey.  A piece of non-adherent gauze was cut and placed over this area for protection.  One piece of black foam was placed in the wound bed.  A section of the black foam was placed over a portion of the foot to pad and protect the tissues from the tubing of the trac pad.  Suction was NOT applied to this segment of foam, only that within the wound bed.  The entire apparatus was secured with a lightly wrapped ACE wrap to the foot.  The non-skid sock was applied to the foot.  The patient tolerated the procedure very well.  He received an IM injection for pain prior to the dressing change.  A "no leak" seal was obtained. Helmut Muster, RN, MSN, CWOCN, CNS-BC, pager [phone]

## 2017-11-09 NOTE — Social Work (Signed)
Clinical Social Worker facilitated patient discharge including contacting patient family and facility to confirm patient discharge plans.  Clinical information faxed to facility and family agreeable with plan.    CSW arranged ambulance transport via [facility] to SNF [facility].    RN to call [phone] to give report prior to discharge.  Clinical Social Worker will sign off for now as social work intervention is no longer needed. Please consult [place] again if new need arises.  Keene Breath, LCSW Clinical Social Worker [phone]

## 2017-11-12 ENCOUNTER — Ambulatory Visit: Payer: No Typology Code available for payment source | Admitting: Cardiovascular Disease

## 2017-11-13 ENCOUNTER — Telehealth: Payer: Self-pay | Admitting: Vascular Surgery

## 2017-11-13 NOTE — Telephone Encounter (Signed)
-----   Message from Sharee Pimple, RN sent at 11/13/2017  1:28 PM EDT ----- Regarding: FW: Post op apt Contact: [phone] Make appt for Dr. Randie Heinz this Friday at 8:30am for postop. I held the slot on his schedule  ----- Message ----- From: Bayard Hugger, LPN Sent: 0/03/8118  11:08 AM To: Sharee Pimple, RN, [facility] Clinical Pool Subject: Post op apt                                    Elease Hashimoto from this pts nursing home called to schedule post op apt.  A staff message has not been sent.  Thanks

## 2017-11-16 ENCOUNTER — Ambulatory Visit (INDEPENDENT_AMBULATORY_CARE_PROVIDER_SITE_OTHER): Payer: Self-pay | Admitting: Vascular Surgery

## 2017-11-16 ENCOUNTER — Encounter: Payer: Self-pay | Admitting: Vascular Surgery

## 2017-11-16 ENCOUNTER — Other Ambulatory Visit: Payer: Self-pay

## 2017-11-16 VITALS — BP 175/86 | HR 72 | Resp 20 | Ht 68.0 in | Wt 203.3 lb

## 2017-11-16 DIAGNOSIS — I779 Disorder of arteries and arterioles, unspecified: Secondary | ICD-10-CM

## 2017-11-16 DIAGNOSIS — T8189XD Other complications of procedures, not elsewhere classified, subsequent encounter: Secondary | ICD-10-CM

## 2017-11-16 NOTE — Progress Notes (Signed)
  Subjective:     Patient ID: Joel Hebert, male   DOB: 11-22-1955, 62 y.o.   MRN: [id number]  HPI 61 year old male follows up after bilateral lower extremity revascularization then had left fifth toe amputation for persistent pain and dry necrosis.  Initial wound had breakdown was subsequently debrided.  He now has a wound VAC in place and is at a nursing facility.  He has persistent pain for which he is taking Percocet and request more pain medicine today.  He is not having any fevers or chills.   Review of Systems Left foot pain    Objective:   Physical Exam Awake alert oriented Nonlabored respirations Strong posterior tibial signal on the left and can follow monophasic signals to the first web interspace and laterally on the foot. Dry gangrene tip of bilateral great toes is stable    Assessment/plan     62 year old male with nonhealing left fifth toe amputation site.  He is not a candidate for the hemostemix trial given chronic anemia.  I discussed with him his options being further debridement with possible placement of acell versus transmetatarsal amputation which appears to be the route he is heading.  Either way he remains at high risk for transtibial amputation in the future.  I have spoken with Joel Hebert regarding plans moving forward for a second opinion and he agrees to see the patient next week.  He is to start Trental 400 mg 3 times daily with meals as well as a nitroglycerin patch placed on the dorsum of the foot daily.  He will be nonweightbearing in the left lower extremity can use heel weightbearing for transfer at this time but if he is to proceed with TMA he will be fully nonweightbearing.  He understands all of this and we will get him into see Joel Hebert next week.  Follow-up here will be based on upcoming surgeries.Joel Junes C. Randie Heinz, MD [facility] [place] Office: [phone] Pager: [phone]

## 2017-11-19 ENCOUNTER — Telehealth: Payer: Self-pay | Admitting: *Deleted

## 2017-11-19 ENCOUNTER — Ambulatory Visit (INDEPENDENT_AMBULATORY_CARE_PROVIDER_SITE_OTHER): Payer: Self-pay | Admitting: Orthopedic Surgery

## 2017-11-19 ENCOUNTER — Encounter (INDEPENDENT_AMBULATORY_CARE_PROVIDER_SITE_OTHER): Payer: Self-pay | Admitting: Orthopedic Surgery

## 2017-11-19 VITALS — Ht 68.0 in | Wt 203.0 lb

## 2017-11-19 DIAGNOSIS — I739 Peripheral vascular disease, unspecified: Secondary | ICD-10-CM

## 2017-11-19 DIAGNOSIS — I70262 Atherosclerosis of native arteries of extremities with gangrene, left leg: Secondary | ICD-10-CM | POA: Insufficient documentation

## 2017-11-19 MED ORDER — PENTOXIFYLLINE ER 400 MG PO TBCR: 400.0000 mg | EXTENDED_RELEASE_TABLET | Freq: Three times a day (TID) | ORAL | 3 refills | Status: DC

## 2017-11-19 MED ORDER — NITROGLYCERIN 0.2 MG/HR TD PT24: 0.2000 mg | MEDICATED_PATCH | Freq: Every day | TRANSDERMAL | 12 refills | Status: DC

## 2017-11-19 NOTE — Progress Notes (Signed)
Office Visit Note   Patient: Joel Hebert           Date of Birth: 1955/11/26           MRN: [id number] Visit Date: 11/19/2017              Requested by: Maeola Harman, MD [address] [place], [place] [address] PCP: Patient, No Pcp Per  Chief Complaint  Patient presents with  . Left Foot - Open Wound    Hx left 5th toe amp 10/09/17      HPI: Patient is a 62 year old gentleman is seen for initial evaluation for ischemic ulcer left foot status post fifth toe amputation gangrenous ulcer great toe status post revascularization with Dr. Randie Heinz. Patient states he underwent stenting for both lower extremities.  Patient states he has not received any of the medications that were ordered from the hospital.  Assessment & Plan: Visit Diagnoses:  1. Peripheral artery disease (HCC)   2. Atherosclerosis of native artery of left lower extremity with gangrene [facility])     Plan: Patient is given a prescription for nitroglycerin patch as well as Trental follow-up in 2 weeks.  Examination patient does have venous swelling in the left lower extremity with pitting edema but no venous ulcers.  Follow-Up Instructions: Return in about 2 weeks (around 12/03/2017).   Ortho Exam  Patient is alert, oriented, no adenopathy, well-dressed, normal affect, normal respiratory effort. Patient does not have a palpable pulse but his Doppler shows a strong dorsalis pedis and posterior tibial biphasic pulse.  He has a dry gangrenous changes to left great toe and a open wound from the fifth toe amputation.  Imaging: No results found. No images are attached to the encounter.  Labs: Lab Results  Component Value Date   HGBA1C 7.0 (H) 10/09/2017     Lab Results  Component Value Date   ALBUMIN 3.8 11/06/2017    Body mass index is 30.87 kg/m.  Orders:  No orders of the defined types were placed in this encounter.  Meds ordered this encounter  Medications  . nitroGLYCERIN (NITRODUR - DOSED  IN MG/24 HR) 0.2 mg/hr patch    Sig: Place 1 patch (0.2 mg total) onto the skin daily.    Dispense:  30 patch    Refill:  12  . pentoxifylline (TRENTAL) 400 MG CR tablet    Sig: Take 1 tablet (400 mg total) by mouth 3 (three) times daily with meals.    Dispense:  90 tablet    Refill:  3     Procedures: No procedures performed  Clinical Data: No additional findings.  ROS:  All other systems negative, except as noted in the HPI. Review of Systems  Objective: Vital Signs: Ht  (1.727 m)   Wt 203 lb (92.1 kg)   BMI 30.87 kg/m   Specialty Comments:  No specialty comments available.  PMFS History: Patient Active Problem List   Diagnosis Date Noted  . Atherosclerosis of native artery of left lower extremity with gangrene (HCC) 11/19/2017  . Peripheral artery disease (HCC) 11/08/2017  . PAD (peripheral artery disease) (HCC) 08/14/2017   Past Medical History:  Diagnosis Date  . Coronary artery disease   . High cholesterol   . Hypertension   . MI (myocardial infarction) (HCC) 08/13/2014; 01/2016- 03/2016 X 3  . PAD (peripheral artery disease) (HCC)   . Peripheral vascular disease (HCC)   . Type II diabetes mellitus (HCC)     Family  History  Problem Relation Age of Onset  . Heart failure Brother   . Heart disease Brother   . Kidney disease Mother   . Heart disease Father     Past Surgical History:  Procedure Laterality Date  . ABDOMINAL AORTOGRAM W/LOWER EXTREMITY  08/14/2017  . ABDOMINAL AORTOGRAM W/LOWER EXTREMITY N/A 08/14/2017   Procedure: ABDOMINAL AORTOGRAM W/LOWER EXTREMITY;  Surgeon: Nada Libman, MD;  Location: [facility] INVASIVE CV LAB;  Service: Cardiovascular;  Laterality: N/A;  . ABDOMINAL AORTOGRAM W/LOWER EXTREMITY N/A 09/05/2017   Procedure: ABDOMINAL AORTOGRAM W/LOWER EXTREMITY;  Surgeon: Maeola Harman, MD;  Location: [facility] INVASIVE CV LAB;  Service: Cardiovascular;  Laterality: N/A;  . AMPUTATION Left 10/09/2017   Procedure: AMPUTATION DIGIT LEFT  FIFTH TOE;  Surgeon: Maeola Harman, MD;  Location: [facility] OR;  Service: Vascular;  Laterality: Left;  . AMPUTATION TOE Left 10/09/2017   left foot 5th toe  . CORONARY ANGIOPLASTY WITH STENT PLACEMENT  01/2016; 03/2016   "1 + 1" (08/14/2017)  . LOWER EXTREMITY ANGIOGRAPHY N/A 08/20/2017   Procedure: LOWER EXTREMITY ANGIOGRAPHY-rt leg;  Surgeon: Sherren Kerns, MD;  Location: [facility] INVASIVE CV LAB;  Service: Cardiovascular;  Laterality: N/A;  . PERIPHERAL VASCULAR ATHERECTOMY  08/14/2017   Procedure: PERIPHERAL VASCULAR ATHERECTOMY;  Surgeon: Nada Libman, MD;  Location: [facility] INVASIVE CV LAB;  Service: Cardiovascular;;  SFA   . PERIPHERAL VASCULAR ATHERECTOMY Left 09/05/2017   Procedure: PERIPHERAL VASCULAR ATHERECTOMY;  Surgeon: Maeola Harman, MD;  Location[facility] INVASIVE CV LAB;  Service: Cardiovascular;  Laterality: Left;  Tibioperoneal and posterior tibial  . PERIPHERAL VASCULAR BALLOON ANGIOPLASTY  08/14/2017   Procedure: PERIPHERAL VASCULAR BALLOON ANGIOPLASTY;  Surgeon: Nada Libman, MD;  Location: [facility] INVASIVE CV LAB;  Service: Cardiovascular;;  peroneal  . PERIPHERAL VASCULAR BALLOON ANGIOPLASTY  08/20/2017   Procedure: PERIPHERAL VASCULAR BALLOON ANGIOPLASTY;  Surgeon: Sherren Kerns, MD;  Location: [facility] INVASIVE CV LAB;  Service: Cardiovascular;;  . PERIPHERAL VASCULAR INTERVENTION  08/14/2017   Procedure: PERIPHERAL VASCULAR INTERVENTION;  Surgeon: Nada Libman, MD;  Location: [facility] INVASIVE CV LAB;  Service: Cardiovascular;;  SFA stent  . TONSILLECTOMY    . WOUND DEBRIDEMENT Left 11/06/2017   Procedure: DEBRIDEMENT WOUND TOE BED OF FIFTH TOE LEFT FOOT;  Surgeon: Maeola Harman, MD;  Location: [facility] OR;  Service: Vascular;  Laterality: Left;   Social History   Occupational History  . Not on file  Tobacco Use  . Smoking status: Current Some Day Smoker    Packs/day: 0.25    Years: 43.00    Pack years: 10.75    Types: Cigarettes    Start date: 08/12/1974  .  Smokeless tobacco: Never Used  . Tobacco comment: 2-3 cigarettes occasionally  Substance and Sexual Activity  . Alcohol use: Not Currently    Comment: 08/14/2017 "nothing since ~ 2012"  . Drug use: No  . Sexual activity: Not Currently

## 2017-11-19 NOTE — Telephone Encounter (Signed)
Called number for facility on Friday evening after msg left by Vance Gather. Was on hold message for over 5 minutes no menu to get any provider there.

## 2017-12-06 ENCOUNTER — Ambulatory Visit (INDEPENDENT_AMBULATORY_CARE_PROVIDER_SITE_OTHER): Payer: Self-pay | Admitting: Orthopedic Surgery

## 2017-12-06 ENCOUNTER — Encounter (INDEPENDENT_AMBULATORY_CARE_PROVIDER_SITE_OTHER): Payer: Self-pay | Admitting: Orthopedic Surgery

## 2017-12-06 VITALS — Ht 68.0 in | Wt 203.0 lb

## 2017-12-06 DIAGNOSIS — I70262 Atherosclerosis of native arteries of extremities with gangrene, left leg: Secondary | ICD-10-CM

## 2017-12-06 MED ORDER — OXYCODONE-ACETAMINOPHEN 10-325 MG PO TABS: 1.0000 | ORAL_TABLET | Freq: Three times a day (TID) | ORAL | 0 refills | Status: DC | PRN

## 2017-12-06 NOTE — Progress Notes (Signed)
Office Visit Note   Patient: Joel Hebert           Date of Birth: Jul 27, 1955           MRN: [id number] Visit Date: 12/06/2017              Requested by: No referring provider defined for this encounter. PCP: Patient, No Pcp Per  Chief Complaint  Patient presents with  . Left Foot - Routine Post Op    11/06/17 left foot  Deb of 5th toe.       HPI: Patient is a 62 year old gentleman who presents in follow-up for his left foot.  He is status post revascularization ankle-brachial indices shows good circulation of the ankle.  Patient has had progressive necrosis to the great toe and the little toe amputation site.  Assessment & Plan: Visit Diagnoses:  1. Atherosclerosis of native artery of left lower extremity with gangrene (HCC)     Plan: Discussed with patient with the progressive gangrenous changes to the forefoot is only option would be to proceed with a transmetatarsal amputation.  Discussed that he is at risk of the wound not healing need for additional surgery.  Discussed that with his diabetes it is critical that he stop smoking.  Discussed that without smoking cessation he has a very small chance of the incision healing and would require a higher level amputation.  Also recommend against using any type of nicotine products or vaping products.  Follow-Up Instructions: Return in about 2 weeks (around 12/20/2017).   Ortho Exam  Patient is alert, oriented, no adenopathy, well-dressed, normal affect, normal respiratory effort. Examination patient has increasing necrosis of the fifth toe amputation site.  There is exposed bone with necrotic tissue patient has dry gangrene of the great toe there is no ascending cellulitis.  Imaging: No results found. No images are attached to the encounter.  Labs: Lab Results  Component Value Date   HGBA1C 7.0 (H) 10/09/2017     Lab Results  Component Value Date   ALBUMIN 3.8 11/06/2017    Body mass index is 30.87  kg/m.  Orders:  No orders of the defined types were placed in this encounter.  Meds ordered this encounter  Medications  . oxyCODONE-acetaminophen (PERCOCET) 10-325 MG tablet    Sig: Take 1 tablet by mouth every 8 (eight) hours as needed for pain.    Dispense:  20 tablet    Refill:  0     Procedures: No procedures performed  Clinical Data: No additional findings.  ROS:  All other systems negative, except as noted in the HPI. Review of Systems  Objective: Vital Signs: Ht  (1.727 m)   Wt 203 lb (92.1 kg)   BMI 30.87 kg/m   Specialty Comments:  No specialty comments available.  PMFS History: Patient Active Problem List   Diagnosis Date Noted  . Atherosclerosis of native artery of left lower extremity with gangrene (HCC) 11/19/2017  . Peripheral artery disease (HCC) 11/08/2017  . PAD (peripheral artery disease) (HCC) 08/14/2017   Past Medical History:  Diagnosis Date  . Coronary artery disease   . High cholesterol   . Hypertension   . MI (myocardial infarction) (HCC) 08/13/2014; 01/2016- 03/2016 X 3  . PAD (peripheral artery disease) (HCC)   . Peripheral vascular disease (HCC)   . Type II diabetes mellitus (HCC)     Family History  Problem Relation Age of Onset  . Heart failure Brother   . Heart  disease Brother   . Kidney disease Mother   . Heart disease Father     Past Surgical History:  Procedure Laterality Date  . ABDOMINAL AORTOGRAM W/LOWER EXTREMITY  08/14/2017  . ABDOMINAL AORTOGRAM W/LOWER EXTREMITY N/A 08/14/2017   Procedure: ABDOMINAL AORTOGRAM W/LOWER EXTREMITY;  Surgeon: Nada Libman, MD;  Location: [facility] INVASIVE [facility] LAB;  Service: Cardiovascular;  Laterality: N/A;  . ABDOMINAL AORTOGRAM W/LOWER EXTREMITY N/A 09/05/2017   Procedure: ABDOMINAL AORTOGRAM W/LOWER EXTREMITY;  Surgeon: Maeola Harman, MD;  Location: [facility] INVASIVE CV LAB;  Service: Cardiovascular;  Laterality: N/A;  . AMPUTATION Left 10/09/2017   Procedure: AMPUTATION DIGIT  LEFT FIFTH TOE;  Surgeon: Maeola Harman, MD;  Location: [facility] OR;  Service: Vascular;  Laterality: Left;  . AMPUTATION TOE Left 10/09/2017   left foot 5th toe  . CORONARY ANGIOPLASTY WITH STENT PLACEMENT  01/2016; 03/2016   "1 + 1" (08/14/2017)  . LOWER EXTREMITY ANGIOGRAPHY N/A 08/20/2017   Procedure: LOWER EXTREMITY ANGIOGRAPHY-rt leg;  Surgeon: Sherren Kerns, MD;  Location: [facility] INVASIVE CV LAB;  Service: Cardiovascular;  Laterality: N/A;  . PERIPHERAL VASCULAR ATHERECTOMY  08/14/2017   Procedure: PERIPHERAL VASCULAR ATHERECTOMY;  Surgeon: Nada Libman, MD;  Location: [facility] INVASIVE CV LAB;  Service: Cardiovascular;;  SFA   . PERIPHERAL VASCULAR ATHERECTOMY Left 09/05/2017   Procedure: PERIPHERAL VASCULAR ATHERECTOMY;  Surgeon: Maeola Harman, MD;  Location: [facility] INVASIVE CV LAB;  Service: Cardiovascular;  Laterality: Left;  Tibioperoneal and posterior tibial  . PERIPHERAL VASCULAR BALLOON ANGIOPLASTY  08/14/2017   Procedure: PERIPHERAL VASCULAR BALLOON ANGIOPLASTY;  Surgeon: Nada Libman, MD;  Location: [facility] INVASIVE CV LAB;  Service: Cardiovascular;;  peroneal  . PERIPHERAL VASCULAR BALLOON ANGIOPLASTY  08/20/2017   Procedure: PERIPHERAL VASCULAR BALLOON ANGIOPLASTY;  Surgeon: Sherren Kerns, MD;  Location: [facility]AS[facility];  Service: Cardiovascular;;  . PERIPHERAL VASCULAR INTERVENTION  08/14/2017   Procedure: PERIPHERAL VASCULAR INTERVENTION;  Surgeon: Nada Libman, MD;  Location: [facility] INVASIVE CV LAB;  Service: Cardiovascular;;  SFA stent  . TONSILLECTOMY    . WOUND DEBRIDEMENT Left 11/06/2017   Procedure: DEBRIDEMENT WOUND TOE BED OF FIFTH TOE LEFT FOOT;  Surgeon: Maeola Harman, MD;  Location: [facility] OR;  Service: Vascular;  Laterality: Left;   Social History   Occupational History  . Not on file  Tobacco Use  . Smoking status: Current Some Day Smoker    Packs/day: 0.25    Years: 43.00    Pack years: 10.75    Types: Cigarettes    Start date: 08/12/1974   . Smokeless tobacco: Never Used  . Tobacco comment: 2-3 cigarettes occasionally  Substance and Sexual Activity  . Alcohol use: Not Currently    Comment: 08/14/2017 "nothing since ~ 2012"  . Drug use: No  . Sexual activity: Not Currently

## 2017-12-12 ENCOUNTER — Encounter (HOSPITAL_COMMUNITY): Payer: Self-pay | Admitting: *Deleted

## 2017-12-12 ENCOUNTER — Other Ambulatory Visit: Payer: Self-pay

## 2017-12-12 NOTE — Progress Notes (Signed)
Spoke with pt for pre-op call. Pt has hx of CAD with 2 stents. Denies any recent chest pain or sob. Pt is a type 2 diabetic. No longer can find his machine to check his blood sugar at home. Instructed pt not to take his Metformin the morning of surgery. He voiced understanding.

## 2017-12-13 ENCOUNTER — Other Ambulatory Visit (INDEPENDENT_AMBULATORY_CARE_PROVIDER_SITE_OTHER): Payer: Self-pay | Admitting: Orthopedic Surgery

## 2017-12-13 DIAGNOSIS — I96 Gangrene, not elsewhere classified: Secondary | ICD-10-CM

## 2017-12-13 NOTE — Anesthesia Preprocedure Evaluation (Addendum)
Anesthesia Evaluation  Patient identified by MRN, date of birth, ID band Patient awake    Reviewed: Allergy & Precautions, NPO status , Patient's Chart, lab work & pertinent test results  Airway Mallampati: II  TM Distance: >3 FB     Dental no notable dental hx. (+) Dental Advisory Given, Edentulous Upper, Poor Dentition   Pulmonary Current Smoker,    breath sounds clear to auscultation       Cardiovascular hypertension, Pt. on medications and Pt. on home beta blockers + CAD, + Past MI and + Peripheral Vascular Disease   Rhythm:Regular Rate:Normal     Neuro/Psych negative neurological ROS  negative psych ROS   GI/Hepatic negative GI ROS, Neg liver ROS,   Endo/Other  diabetes, Type 2, Oral Hypoglycemic Agents  Renal/GU negative Renal ROS     Musculoskeletal negative musculoskeletal ROS (+)   Abdominal   Peds  Hematology   Anesthesia Other Findings   Reproductive/Obstetrics                           Anesthesia Physical Anesthesia Plan  ASA: III  Anesthesia Plan: General   Post-op Pain Management:    Induction: Intravenous  PONV Risk Score and Plan: Treatment may vary due to age or medical condition  Airway Management Planned: LMA  Additional Equipment:   Intra-op Plan:   Post-operative Plan:   Informed Consent: I have reviewed the patients History and Physical, chart, labs and discussed the procedure including the risks, benefits and alternatives for the proposed anesthesia with the patient or authorized representative who has indicated his/her understanding and acceptance.     Plan Discussed with: CRNA, Anesthesiologist and Surgeon  Anesthesia Plan Comments:        Anesthesia Quick Evaluation

## 2017-12-14 ENCOUNTER — Inpatient Hospital Stay (HOSPITAL_COMMUNITY)
Admission: RE | Admit: 2017-12-14 | Discharge: 2017-12-18 | DRG: 241 | Disposition: A | Discharge status: Skilled Nursing Facility | Payer: Medicaid Other | Attending: Orthopedic Surgery | Admitting: Orthopedic Surgery

## 2017-12-14 ENCOUNTER — Telehealth (INDEPENDENT_AMBULATORY_CARE_PROVIDER_SITE_OTHER): Payer: Self-pay | Admitting: Orthopedic Surgery

## 2017-12-14 ENCOUNTER — Other Ambulatory Visit: Payer: Self-pay

## 2017-12-14 ENCOUNTER — Encounter (HOSPITAL_COMMUNITY): Payer: Self-pay | Admitting: Surgery

## 2017-12-14 ENCOUNTER — Inpatient Hospital Stay (HOSPITAL_COMMUNITY): Payer: Medicaid Other | Admitting: Anesthesiology

## 2017-12-14 ENCOUNTER — Encounter (HOSPITAL_COMMUNITY)
Admission: RE | Disposition: A | Discharge status: Skilled Nursing Facility | Payer: Self-pay | Source: Home / Self Care | Attending: Orthopedic Surgery

## 2017-12-14 DIAGNOSIS — Z7984 Long term (current) use of oral hypoglycemic drugs: Secondary | ICD-10-CM

## 2017-12-14 DIAGNOSIS — Z8249 Family history of ischemic heart disease and other diseases of the circulatory system: Secondary | ICD-10-CM

## 2017-12-14 DIAGNOSIS — I2510 Atherosclerotic heart disease of native coronary artery without angina pectoris: Secondary | ICD-10-CM | POA: Diagnosis present

## 2017-12-14 DIAGNOSIS — F17210 Nicotine dependence, cigarettes, uncomplicated: Secondary | ICD-10-CM | POA: Diagnosis present

## 2017-12-14 DIAGNOSIS — I96 Gangrene, not elsewhere classified: Secondary | ICD-10-CM

## 2017-12-14 DIAGNOSIS — Z89432 Acquired absence of left foot: Secondary | ICD-10-CM | POA: Diagnosis present

## 2017-12-14 DIAGNOSIS — I252 Old myocardial infarction: Secondary | ICD-10-CM

## 2017-12-14 DIAGNOSIS — I70262 Atherosclerosis of native arteries of extremities with gangrene, left leg: Secondary | ICD-10-CM | POA: Diagnosis present

## 2017-12-14 DIAGNOSIS — Z955 Presence of coronary angioplasty implant and graft: Secondary | ICD-10-CM

## 2017-12-14 DIAGNOSIS — E7800 Pure hypercholesterolemia, unspecified: Secondary | ICD-10-CM | POA: Diagnosis present

## 2017-12-14 DIAGNOSIS — I10 Essential (primary) hypertension: Secondary | ICD-10-CM | POA: Diagnosis present

## 2017-12-14 DIAGNOSIS — T8754 Necrosis of amputation stump, left lower extremity: Secondary | ICD-10-CM | POA: Diagnosis present

## 2017-12-14 DIAGNOSIS — Z7902 Long term (current) use of antithrombotics/antiplatelets: Secondary | ICD-10-CM

## 2017-12-14 DIAGNOSIS — Z7982 Long term (current) use of aspirin: Secondary | ICD-10-CM | POA: Diagnosis not present

## 2017-12-14 DIAGNOSIS — Z95820 Peripheral vascular angioplasty status with implants and grafts: Secondary | ICD-10-CM

## 2017-12-14 DIAGNOSIS — E1152 Type 2 diabetes mellitus with diabetic peripheral angiopathy with gangrene: Secondary | ICD-10-CM | POA: Diagnosis present

## 2017-12-14 HISTORY — PX: AMPUTATION: SHX166

## 2017-12-14 LAB — HEMOGLOBIN A1C
Hgb A1c MFr Bld: 7.2 % — ABNORMAL HIGH (ref 4.8–5.6)
MEAN PLASMA GLUCOSE: 159.94 mg/dL

## 2017-12-14 LAB — GLUCOSE, CAPILLARY
GLUCOSE-CAPILLARY: 119 mg/dL — AB (ref 65–99)
GLUCOSE-CAPILLARY: 120 mg/dL — AB (ref 65–99)
GLUCOSE-CAPILLARY: 154 mg/dL — AB (ref 65–99)
GLUCOSE-CAPILLARY: 171 mg/dL — AB (ref 65–99)

## 2017-12-14 LAB — BASIC METABOLIC PANEL
ANION GAP: 8 (ref 5–15)
BUN: 9 mg/dL (ref 6–20)
CO2: 27 mmol/L (ref 22–32)
Calcium: 9.5 mg/dL (ref 8.9–10.3)
Chloride: 106 mmol/L (ref 101–111)
Creatinine, Ser: 1.05 mg/dL (ref 0.61–1.24)
Glucose, Bld: 129 mg/dL — ABNORMAL HIGH (ref 65–99)
POTASSIUM: 4.2 mmol/L (ref 3.5–5.1)
SODIUM: 141 mmol/L (ref 135–145)

## 2017-12-14 LAB — CBC
HEMATOCRIT: 28.9 % — AB (ref 39.0–52.0)
HEMOGLOBIN: 9.1 g/dL — AB (ref 13.0–17.0)
MCH: 27.1 pg (ref 26.0–34.0)
MCHC: 31.5 g/dL (ref 30.0–36.0)
MCV: 86 fL (ref 78.0–100.0)
Platelets: 299 10*3/uL (ref 150–400)
RBC: 3.36 MIL/uL — ABNORMAL LOW (ref 4.22–5.81)
RDW: 12.8 % (ref 11.5–15.5)
WBC: 7.4 10*3/uL (ref 4.0–10.5)

## 2017-12-14 SURGERY — AMPUTATION, FOOT, PARTIAL
Anesthesia: General | Site: Foot | Laterality: Left

## 2017-12-14 MED ORDER — HYDROMORPHONE HCL 2 MG/ML IJ SOLN
0.5000 mg | INTRAMUSCULAR | Status: DC | PRN
  Administered 2017-12-14 – 2017-12-16 (×7): 1 mg via INTRAVENOUS
  Filled 2017-12-14 (×7): qty 1

## 2017-12-14 MED ORDER — ACETAMINOPHEN 325 MG PO TABS
325.0000 mg | ORAL_TABLET | Freq: Four times a day (QID) | ORAL | Status: DC | PRN
  Administered 2017-12-15 – 2017-12-18 (×4): 650 mg via ORAL
  Filled 2017-12-14 (×4): qty 2

## 2017-12-14 MED ORDER — FENTANYL CITRATE (PF) 250 MCG/5ML IJ SOLN
INTRAMUSCULAR | Status: AC
  Filled 2017-12-14: qty 5

## 2017-12-14 MED ORDER — METHOCARBAMOL 1000 MG/10ML IJ SOLN
500.0000 mg | Freq: Four times a day (QID) | INTRAVENOUS | Status: DC | PRN
  Filled 2017-12-14: qty 5

## 2017-12-14 MED ORDER — ACETAMINOPHEN 500 MG PO TABS
500.0000 mg | ORAL_TABLET | Freq: Four times a day (QID) | ORAL | Status: DC | PRN
  Administered 2017-12-15 – 2017-12-18 (×2): 500 mg via ORAL
  Filled 2017-12-14 (×2): qty 1

## 2017-12-14 MED ORDER — 0.9 % SODIUM CHLORIDE (POUR BTL) OPTIME
TOPICAL | Status: DC | PRN
  Administered 2017-12-14: 1000 mL

## 2017-12-14 MED ORDER — PROPOFOL 10 MG/ML IV BOLUS
INTRAVENOUS | Status: AC
  Filled 2017-12-14: qty 20

## 2017-12-14 MED ORDER — METOCLOPRAMIDE HCL 5 MG PO TABS: 5.0000 mg | ORAL_TABLET | Freq: Three times a day (TID) | ORAL | Status: DC | PRN

## 2017-12-14 MED ORDER — CEFAZOLIN SODIUM-DEXTROSE 2-4 GM/100ML-% IV SOLN
2.0000 g | INTRAVENOUS | Status: AC
  Administered 2017-12-14: 2 g via INTRAVENOUS
  Filled 2017-12-14: qty 100

## 2017-12-14 MED ORDER — METHOCARBAMOL 500 MG PO TABS
500.0000 mg | ORAL_TABLET | Freq: Four times a day (QID) | ORAL | Status: DC | PRN
  Administered 2017-12-14 – 2017-12-18 (×10): 500 mg via ORAL
  Filled 2017-12-14 (×10): qty 1

## 2017-12-14 MED ORDER — CLONIDINE HCL 0.2 MG PO TABS
0.2000 mg | ORAL_TABLET | Freq: Once | ORAL | Status: AC
  Administered 2017-12-14: 0.2 mg via ORAL
  Filled 2017-12-14: qty 1

## 2017-12-14 MED ORDER — MIDAZOLAM HCL 2 MG/2ML IJ SOLN
1.0000 mg | INTRAMUSCULAR | Status: DC | PRN
  Administered 2017-12-14: 2 mg via INTRAVENOUS

## 2017-12-14 MED ORDER — DOCUSATE SODIUM 100 MG PO CAPS
100.0000 mg | ORAL_CAPSULE | Freq: Two times a day (BID) | ORAL | Status: DC
  Administered 2017-12-14 – 2017-12-18 (×7): 100 mg via ORAL
  Filled 2017-12-14 (×8): qty 1

## 2017-12-14 MED ORDER — HYDROCHLOROTHIAZIDE 25 MG PO TABS
25.0000 mg | ORAL_TABLET | Freq: Every day | ORAL | Status: DC
  Administered 2017-12-14 – 2017-12-18 (×5): 25 mg via ORAL
  Filled 2017-12-14 (×4): qty 1

## 2017-12-14 MED ORDER — FENTANYL CITRATE (PF) 100 MCG/2ML IJ SOLN
50.0000 ug | INTRAMUSCULAR | Status: DC | PRN
  Administered 2017-12-14: 100 ug via INTRAVENOUS

## 2017-12-14 MED ORDER — PENTOXIFYLLINE ER 400 MG PO TBCR
400.0000 mg | EXTENDED_RELEASE_TABLET | Freq: Three times a day (TID) | ORAL | Status: DC
  Administered 2017-12-14 – 2017-12-18 (×12): 400 mg via ORAL
  Filled 2017-12-14 (×13): qty 1

## 2017-12-14 MED ORDER — NITROGLYCERIN 0.4 MG SL SUBL
0.4000 mg | SUBLINGUAL_TABLET | SUBLINGUAL | Status: DC | PRN
  Administered 2017-12-14: 0.4 mg via SUBLINGUAL

## 2017-12-14 MED ORDER — ONDANSETRON HCL 4 MG PO TABS: 4.0000 mg | ORAL_TABLET | Freq: Four times a day (QID) | ORAL | Status: DC | PRN

## 2017-12-14 MED ORDER — PHENYLEPHRINE HCL 10 MG/ML IJ SOLN
INTRAMUSCULAR | Status: DC | PRN
  Administered 2017-12-14: 80 ug via INTRAVENOUS

## 2017-12-14 MED ORDER — OXYCODONE HCL 5 MG PO TABS
10.0000 mg | ORAL_TABLET | ORAL | Status: DC | PRN
  Administered 2017-12-14: 10 mg via ORAL
  Administered 2017-12-15 – 2017-12-18 (×16): 15 mg via ORAL
  Filled 2017-12-14 (×16): qty 3

## 2017-12-14 MED ORDER — CLOPIDOGREL BISULFATE 75 MG PO TABS
75.0000 mg | ORAL_TABLET | Freq: Every day | ORAL | Status: DC
  Administered 2017-12-15 – 2017-12-18 (×4): 75 mg via ORAL
  Filled 2017-12-14 (×4): qty 1

## 2017-12-14 MED ORDER — ROPIVACAINE HCL 5 MG/ML IJ SOLN
INTRAMUSCULAR | Status: DC | PRN
  Administered 2017-12-14: 30 mL via PERINEURAL

## 2017-12-14 MED ORDER — MEPERIDINE HCL 50 MG/ML IJ SOLN: 6.2500 mg | INTRAMUSCULAR | Status: DC | PRN

## 2017-12-14 MED ORDER — METFORMIN HCL 850 MG PO TABS
850.0000 mg | ORAL_TABLET | Freq: Two times a day (BID) | ORAL | Status: DC
  Administered 2017-12-14 – 2017-12-18 (×8): 850 mg via ORAL
  Filled 2017-12-14 (×9): qty 1

## 2017-12-14 MED ORDER — PROPOFOL 10 MG/ML IV BOLUS
INTRAVENOUS | Status: DC | PRN
  Administered 2017-12-14: 150 mg via INTRAVENOUS

## 2017-12-14 MED ORDER — ACETAMINOPHEN 500 MG PO TABS
1000.0000 mg | ORAL_TABLET | Freq: Once | ORAL | Status: AC
  Administered 2017-12-14: 1000 mg via ORAL
  Filled 2017-12-14: qty 2

## 2017-12-14 MED ORDER — METOPROLOL TARTRATE 100 MG PO TABS
100.0000 mg | ORAL_TABLET | Freq: Two times a day (BID) | ORAL | Status: DC
  Administered 2017-12-14 – 2017-12-18 (×8): 100 mg via ORAL
  Filled 2017-12-14 (×8): qty 1

## 2017-12-14 MED ORDER — POLYETHYLENE GLYCOL 3350 17 G PO PACK
17.0000 g | PACK | Freq: Every day | ORAL | Status: DC | PRN
  Administered 2017-12-17: 17 g via ORAL
  Filled 2017-12-14: qty 1

## 2017-12-14 MED ORDER — SODIUM CHLORIDE 0.9 % IV SOLN
INTRAVENOUS | Status: DC
  Administered 2017-12-14: 16:00:00 via INTRAVENOUS

## 2017-12-14 MED ORDER — LIDOCAINE HCL (CARDIAC) PF 100 MG/5ML IV SOSY
PREFILLED_SYRINGE | INTRAVENOUS | Status: DC | PRN
  Administered 2017-12-14: 100 mg via INTRAVENOUS

## 2017-12-14 MED ORDER — LACTATED RINGERS IV SOLN: INTRAVENOUS | Status: DC

## 2017-12-14 MED ORDER — CHLORHEXIDINE GLUCONATE 4 % EX LIQD: 60.0000 mL | Freq: Once | CUTANEOUS | Status: DC

## 2017-12-14 MED ORDER — MIDAZOLAM HCL 2 MG/2ML IJ SOLN
INTRAMUSCULAR | Status: AC
  Administered 2017-12-14: 2 mg via INTRAVENOUS
  Filled 2017-12-14: qty 2

## 2017-12-14 MED ORDER — ONDANSETRON HCL 4 MG/2ML IJ SOLN: 4.0000 mg | Freq: Four times a day (QID) | INTRAMUSCULAR | Status: DC | PRN

## 2017-12-14 MED ORDER — GABAPENTIN 300 MG PO CAPS
300.0000 mg | ORAL_CAPSULE | Freq: Two times a day (BID) | ORAL | Status: DC
  Administered 2017-12-14 – 2017-12-18 (×8): 300 mg via ORAL
  Filled 2017-12-14 (×8): qty 1

## 2017-12-14 MED ORDER — INSULIN ASPART 100 UNIT/ML ~~LOC~~ SOLN
0.0000 [IU] | Freq: Three times a day (TID) | SUBCUTANEOUS | Status: DC
  Administered 2017-12-14: 3 [IU] via SUBCUTANEOUS
  Administered 2017-12-15 – 2017-12-17 (×5): 2 [IU] via SUBCUTANEOUS

## 2017-12-14 MED ORDER — ROSUVASTATIN CALCIUM 10 MG PO TABS
20.0000 mg | ORAL_TABLET | Freq: Every evening | ORAL | Status: DC
  Administered 2017-12-14 – 2017-12-17 (×4): 20 mg via ORAL
  Filled 2017-12-14 (×4): qty 2

## 2017-12-14 MED ORDER — LACTATED RINGERS IV SOLN
INTRAVENOUS | Status: DC | PRN
  Administered 2017-12-14 (×2): via INTRAVENOUS

## 2017-12-14 MED ORDER — HYDROMORPHONE HCL 2 MG/ML IJ SOLN
0.3000 mg | INTRAMUSCULAR | Status: DC | PRN
  Administered 2017-12-14 (×2): 0.5 mg via INTRAVENOUS

## 2017-12-14 MED ORDER — ACETAMINOPHEN 10 MG/ML IV SOLN: 1000.0000 mg | Freq: Once | INTRAVENOUS | Status: DC | PRN

## 2017-12-14 MED ORDER — OXYCODONE HCL 5 MG PO TABS
5.0000 mg | ORAL_TABLET | ORAL | Status: DC | PRN
  Administered 2017-12-14: 5 mg via ORAL
  Filled 2017-12-14 (×2): qty 2

## 2017-12-14 MED ORDER — ONDANSETRON HCL 4 MG/2ML IJ SOLN
INTRAMUSCULAR | Status: AC
  Filled 2017-12-14: qty 2

## 2017-12-14 MED ORDER — GABAPENTIN 300 MG PO CAPS
300.0000 mg | ORAL_CAPSULE | Freq: Once | ORAL | Status: AC
  Administered 2017-12-14: 300 mg via ORAL
  Filled 2017-12-14: qty 1

## 2017-12-14 MED ORDER — LISINOPRIL 20 MG PO TABS
20.0000 mg | ORAL_TABLET | Freq: Every day | ORAL | Status: DC
  Administered 2017-12-14 – 2017-12-18 (×5): 20 mg via ORAL
  Filled 2017-12-14 (×5): qty 1

## 2017-12-14 MED ORDER — PROMETHAZINE HCL 25 MG/ML IJ SOLN: 6.2500 mg | INTRAMUSCULAR | Status: DC | PRN

## 2017-12-14 MED ORDER — ONDANSETRON HCL 4 MG/2ML IJ SOLN
INTRAMUSCULAR | Status: DC | PRN
  Administered 2017-12-14: 4 mg via INTRAVENOUS

## 2017-12-14 MED ORDER — BISACODYL 10 MG RE SUPP
10.0000 mg | Freq: Every day | RECTAL | Status: DC | PRN
  Administered 2017-12-18: 10 mg via RECTAL
  Filled 2017-12-14: qty 1

## 2017-12-14 MED ORDER — FENTANYL CITRATE (PF) 100 MCG/2ML IJ SOLN
INTRAMUSCULAR | Status: DC | PRN
  Administered 2017-12-14 (×2): 25 ug via INTRAVENOUS
  Administered 2017-12-14: 50 ug via INTRAVENOUS

## 2017-12-14 MED ORDER — LISINOPRIL-HYDROCHLOROTHIAZIDE 20-25 MG PO TABS: 1.0000 | ORAL_TABLET | Freq: Every day | ORAL | Status: DC

## 2017-12-14 MED ORDER — HYDROCODONE-ACETAMINOPHEN 7.5-325 MG PO TABS: 1.0000 | ORAL_TABLET | Freq: Once | ORAL | Status: DC | PRN

## 2017-12-14 MED ORDER — CEFAZOLIN SODIUM-DEXTROSE 2-4 GM/100ML-% IV SOLN
2.0000 g | Freq: Four times a day (QID) | INTRAVENOUS | Status: AC
  Administered 2017-12-14 – 2017-12-15 (×3): 2 g via INTRAVENOUS
  Filled 2017-12-14 (×3): qty 100

## 2017-12-14 MED ORDER — FENTANYL CITRATE (PF) 100 MCG/2ML IJ SOLN
INTRAMUSCULAR | Status: AC
  Administered 2017-12-14: 100 ug via INTRAVENOUS
  Filled 2017-12-14: qty 2

## 2017-12-14 MED ORDER — HYDROMORPHONE HCL 2 MG/ML IJ SOLN
INTRAMUSCULAR | Status: AC
  Administered 2017-12-14: 0.5 mg via INTRAVENOUS
  Filled 2017-12-14: qty 1

## 2017-12-14 MED ORDER — MAGNESIUM CITRATE PO SOLN
1.0000 | Freq: Once | ORAL | Status: AC | PRN
  Administered 2017-12-18: 1 via ORAL
  Filled 2017-12-14: qty 296

## 2017-12-14 MED ORDER — METOCLOPRAMIDE HCL 5 MG/ML IJ SOLN: 5.0000 mg | Freq: Three times a day (TID) | INTRAMUSCULAR | Status: DC | PRN

## 2017-12-14 MED ORDER — NITROGLYCERIN 0.2 MG/HR TD PT24
0.2000 mg | MEDICATED_PATCH | Freq: Every day | TRANSDERMAL | Status: DC
  Administered 2017-12-14 – 2017-12-18 (×5): 0.2 mg via TRANSDERMAL
  Filled 2017-12-14 (×5): qty 1

## 2017-12-14 SURGICAL SUPPLY — 37 items
APL SKNCLS STERI-STRIP NONHPOA (GAUZE/BANDAGES/DRESSINGS) ×1
BENZOIN TINCTURE PRP APPL 2/3 (GAUZE/BANDAGES/DRESSINGS) ×2 IMPLANT
BLADE SAW SGTL HD 18.5X60.5X1. (BLADE) ×2 IMPLANT
BLADE SURG 21 STRL SS (BLADE) ×2 IMPLANT
BNDG COHESIVE 4X5 TAN STRL (GAUZE/BANDAGES/DRESSINGS) IMPLANT
BNDG GAUZE ELAST 4 BULKY (GAUZE/BANDAGES/DRESSINGS) IMPLANT
COVER SURGICAL LIGHT HANDLE (MISCELLANEOUS) ×2 IMPLANT
DRAPE INCISE IOBAN 66X45 STRL (DRAPES) ×2 IMPLANT
DRAPE U-SHAPE 47X51 STRL (DRAPES) ×2 IMPLANT
DRESSING PREVENA PLUS CUSTOM (GAUZE/BANDAGES/DRESSINGS) IMPLANT
DRSG ADAPTIC 3X8 NADH LF (GAUZE/BANDAGES/DRESSINGS) IMPLANT
DRSG PAD ABDOMINAL 8X10 ST (GAUZE/BANDAGES/DRESSINGS) IMPLANT
DRSG PREVENA PLUS CUSTOM (GAUZE/BANDAGES/DRESSINGS) ×2
DURAPREP 26ML APPLICATOR (WOUND CARE) ×2 IMPLANT
ELECT REM PT RETURN 9FT ADLT (ELECTROSURGICAL) ×2
ELECTRODE REM PT RTRN 9FT ADLT (ELECTROSURGICAL) ×1 IMPLANT
GAUZE SPONGE 4X4 12PLY STRL (GAUZE/BANDAGES/DRESSINGS) IMPLANT
GLOVE BIOGEL PI IND STRL 9 (GLOVE) ×1 IMPLANT
GLOVE BIOGEL PI INDICATOR 9 (GLOVE) ×2
GLOVE SURG ORTHO 9.0 STRL STRW (GLOVE) ×3 IMPLANT
GOWN STRL REUS W/ TWL XL LVL3 (GOWN DISPOSABLE) ×3 IMPLANT
GOWN STRL REUS W/TWL XL LVL3 (GOWN DISPOSABLE) ×6
KIT BASIN OR (CUSTOM PROCEDURE TRAY) ×2 IMPLANT
KIT DRSG PREVENA PLUS 7DAY 125 (MISCELLANEOUS) ×1 IMPLANT
KIT PREVENA INCISION MGT 13 (CANNISTER) ×1 IMPLANT
KIT TURNOVER KIT B (KITS) ×2 IMPLANT
NS IRRIG 1000ML POUR BTL (IV SOLUTION) ×2 IMPLANT
PACK ORTHO EXTREMITY (CUSTOM PROCEDURE TRAY) ×2 IMPLANT
PAD ARMBOARD 7.5X6 YLW CONV (MISCELLANEOUS) ×2 IMPLANT
SPONGE LAP 18X18 X RAY DECT (DISPOSABLE) IMPLANT
SUT ETHILON 2 0 PSLX (SUTURE) ×4 IMPLANT
SUT VIC AB 2-0 CTB1 (SUTURE) IMPLANT
TOWEL OR 17X24 6PK STRL BLUE (TOWEL DISPOSABLE) ×2 IMPLANT
TOWEL OR 17X26 10 PK STRL BLUE (TOWEL DISPOSABLE) ×2 IMPLANT
TUBE CONNECTING 12X1/4 (SUCTIONS) ×2 IMPLANT
WATER STERILE IRR 1000ML POUR (IV SOLUTION) ×1 IMPLANT
YANKAUER SUCT BULB TIP NO VENT (SUCTIONS) ×2 IMPLANT

## 2017-12-14 NOTE — H&P (Signed)
Joel Hebert is an 62 y.o. male.   Chief Complaint: Necrosis gangrene left forefoot HPI: Patient is a 62 year old gentleman who presents in follow-up for his left foot.  He is status post revascularization ankle-brachial indices shows good circulation of the ankle.  Patient has had progressive necrosis to the great toe and the little toe amputation site.    Past Medical History:  Diagnosis Date  . Coronary artery disease   . High cholesterol   . Hypertension   . MI (myocardial infarction) ([place]) 08/13/2014; 01/2016- 03/2016 X 3  . PAD (peripheral artery disease) ([place])   . Peripheral vascular disease ([place])   . Type II diabetes mellitus ([place])     Past Surgical History:  Procedure Laterality Date  . ABDOMINAL AORTOGRAM W/LOWER EXTREMITY  08/14/2017  . ABDOMINAL AORTOGRAM W/LOWER EXTREMITY N/A 08/14/2017   Procedure: ABDOMINAL AORTOGRAM W/LOWER EXTREMITY;  Surgeon: Serafina Mitchell, MD;  Location: [place] CV LAB;  Service: Cardiovascular;  Laterality: N/A;  . ABDOMINAL AORTOGRAM W/LOWER EXTREMITY N/A 09/05/2017   Procedure: ABDOMINAL AORTOGRAM W/LOWER EXTREMITY;  Surgeon: Waynetta Sandy, MD;  Location: [place] CV LAB;  Service: Cardiovascular;  Laterality: N/A;  . AMPUTATION Left 10/09/2017   Procedure: AMPUTATION DIGIT LEFT FIFTH TOE;  Surgeon: Waynetta Sandy, MD;  Location: [place];  Service: Vascular;  Laterality: Left;  . AMPUTATION TOE Left 10/09/2017   left foot 5th toe  . CORONARY ANGIOPLASTY WITH STENT PLACEMENT  01/2016; 03/2016   "1 + 1" (08/14/2017)  . LOWER EXTREMITY ANGIOGRAPHY N/A 08/20/2017   Procedure: LOWER EXTREMITY ANGIOGRAPHY-rt leg;  Surgeon: Elam Dutch, MD;  Location: [place] CV LAB;  Service: Cardiovascular;  Laterality: N/A;  . PERIPHERAL VASCULAR ATHERECTOMY  08/14/2017   Procedure: PERIPHERAL VASCULAR ATHERECTOMY;  Surgeon: Serafina Mitchell, MD;  Location: [place] CV LAB;  Service: Cardiovascular;;  SFA   . PERIPHERAL VASCULAR  ATHERECTOMY Left 09/05/2017   Procedure: PERIPHERAL VASCULAR ATHERECTOMY;  Surgeon: Waynetta Sandy, MD;  Location: [place] CV LAB;  Service: Cardiovascular;  Laterality: Left;  Tibioperoneal and posterior tibial  . PERIPHERAL VASCULAR BALLOON ANGIOPLASTY  08/14/2017   Procedure: PERIPHERAL VASCULAR BALLOON ANGIOPLASTY;  Surgeon: Serafina Mitchell, MD;  Location: [place] CV LAB;  Service: Cardiovascular;;  peroneal  . PERIPHERAL VASCULAR BALLOON ANGIOPLASTY  08/20/2017   Procedure: PERIPHERAL VASCULAR BALLOON ANGIOPLASTY;  Surgeon: Elam Dutch, MD;  Location: [place] CV LAB;  Service: Cardiovascular;;  . PERIPHERAL VASCULAR INTERVENTION  08/14/2017   Procedure: PERIPHERAL VASCULAR INTERVENTION;  Surgeon: Serafina Mitchell, MD;  Location: [place] CV LAB;  Service: Cardiovascular;;  SFA stent  . TONSILLECTOMY    . WOUND DEBRIDEMENT Left 11/06/2017   Procedure: DEBRIDEMENT WOUND TOE BED OF FIFTH TOE LEFT FOOT;  Surgeon: Waynetta Sandy, MD;  Location: [facility] OR;  Service: Vascular;  Laterality: Left;    Family History  Problem Relation Age of Onset  . Heart failure Brother   . Heart disease Brother   . Kidney disease Mother   . Heart disease Father    Social History:  reports that he has been smoking cigarettes.  He started smoking about 43 years ago. He has a 10.75 pack-year smoking history. He has never used smokeless tobacco. He reports that he drank alcohol. He reports that he does not use drugs.  Allergies: No Known Allergies  Medications Prior to Admission  Medication Sig Dispense Refill  . acetaminophen (TYLENOL) 500 MG tablet Take 500 mg by  mouth every 6 (six) hours as needed for mild pain or headache.     Marland Kitchen aspirin EC 81 MG tablet Take 81 mg by mouth 2 (two) times daily.     . clopidogrel (PLAVIX) 75 MG tablet Take 1 tablet (75 mg total) by mouth daily with breakfast. 30 tablet 11  . diclofenac (VOLTAREN) 50 MG EC tablet Take 1 tablet (50 mg total) by  mouth 2 (two) times daily. 20 tablet 0  . gabapentin (NEURONTIN) 300 MG capsule Take 1 capsule (300 mg total) by mouth 2 (two) times daily. 30 capsule 0  . lisinopril-hydrochlorothiazide (PRINZIDE,ZESTORETIC) 20-25 MG tablet Take 1 tablet by mouth daily.   2  . metFORMIN (GLUCOPHAGE) 850 MG tablet Take 850 mg by mouth 2 (two) times daily with a meal.    . metoprolol tartrate (LOPRESSOR) 100 MG tablet Take 100 mg by mouth 2 (two) times daily.    . nitroGLYCERIN (NITRODUR - DOSED IN MG/24 HR) 0.2 mg/hr patch Place 1 patch (0.2 mg total) onto the skin daily. 30 patch 12  . pentoxifylline (TRENTAL) 400 MG CR tablet Take 1 tablet (400 mg total) by mouth 3 (three) times daily with meals. (Patient taking differently: Take 400 mg by mouth 2 (two) times daily. ) 90 tablet 3  . rosuvastatin (CRESTOR) 20 MG tablet Take 20 mg by mouth every evening.     . nitroGLYCERIN (NITROSTAT) 0.4 MG SL tablet Place 0.4 mg under the tongue every 5 (five) minutes as needed for chest pain.    Marland Kitchen oxyCODONE-acetaminophen (PERCOCET) 10-325 MG tablet Take 1 tablet by mouth every 8 (eight) hours as needed for pain. (Patient not taking: Reported on 12/11/2017) 20 tablet 0  . oxyCODONE-acetaminophen (PERCOCET/ROXICET) 5-325 MG tablet Take 1 tablet by mouth every 4 (four) hours as needed for severe pain. (Patient not taking: Reported on 12/11/2017) 30 tablet 0    Results for orders placed or performed during the hospital encounter of 12/14/17 (from the past 48 hour(s))  Glucose, capillary     Status: Abnormal   Collection Time: 12/14/17  9:28 AM  Result Value Ref Range   Glucose-Capillary 119 (H) 65 - 99 mg/dL  Basic metabolic panel     Status: Abnormal   Collection Time: 12/14/17  9:41 AM  Result Value Ref Range   Sodium 141 135 - 145 mmol/L   Potassium 4.2 3.5 - 5.1 mmol/L   Chloride 106 101 - 111 mmol/L   CO2 27 22 - 32 mmol/L   Glucose, Bld 129 (H) 65 - 99 mg/dL   BUN 9 6 - 20 mg/dL   Creatinine, Ser 1.05 0.61 - 1.24 mg/dL    Calcium 9.5 8.9 - 10.3 mg/dL   GFR calc non Af Amer >60 >60 mL/min   GFR calc Af Amer >60 >60 mL/min    Comment: (NOTE) The eGFR has been calculated using the CKD EPI equation. This calculation has not been validated in all clinical situations. eGFR's persistently <60 mL/min signify possible Chronic Kidney Disease.    Anion gap 8 5 - 15    Comment: Performed at [facility] [address], [place], [place] [address]  CBC     Status: Abnormal   Collection Time: 12/14/17  9:41 AM  Result Value Ref Range   WBC 7.4 4.0 - 10.5 K/uL   RBC 3.36 (L) 4.22 - 5.81 MIL/uL   Hemoglobin 9.1 (L) 13.0 - 17.0 g/dL   HCT 28.9 (L) 39.0 - 52.0 %  MCV 86.0 78.0 - 100.0 fL   MCH 27.1 26.0 - 34.0 pg   MCHC 31.5 30.0 - 36.0 g/dL   RDW 12.8 11.5 - 15.5 %   Platelets 299 150 - 400 K/uL    Comment: Performed at [facility], [place] [address], [place], [place] [address]  Hemoglobin A1c     Status: Abnormal   Collection Time: 12/14/17  9:41 AM  Result Value Ref Range   Hgb A1c MFr Bld 7.2 (H) 4.8 - 5.6 %    Comment: (NOTE) Pre diabetes:          5.7%-6.4% Diabetes:              >6.4% Glycemic control for   <7.0% adults with diabetes    Mean Plasma Glucose 159.94 mg/dL    Comment: Performed at [facility] [address], [place], [place] [address]   No results found.  Review of Systems  All other systems reviewed and are negative.   Blood pressure (!) 186/87, pulse 84, temperature 98.7 F (37.1 C), temperature source Oral, resp. rate 12, height '5\' 8"'$  (1.727 m), weight 200 lb (90.7 kg), SpO2 100 %. Physical Exam  Patient is alert, oriented, no adenopathy, well-dressed, normal affect, normal respiratory effort. Examination patient has increasing necrosis of the fifth toe amputation site.  There is exposed bone with necrotic tissue patient has dry gangrene of the great toe there is no ascending cellulitis.   Assessment/Plan 1. Atherosclerosis of native artery of left lower  extremity with gangrene ([place])     Plan: Discussed with patient with the progressive gangrenous changes to the forefoot is only option would be to proceed with a transmetatarsal amputation.  Discussed that he is at risk of the wound not healing need for additional surgery.  Discussed that with his diabetes it is critical that he stop smoking.  Discussed that without smoking cessation he has a very small chance of the incision healing and would require a higher level amputation.  Also recommend against using any type of nicotine products or vaping products.     Newt Minion, MD 12/14/2017, 11:45 AM

## 2017-12-14 NOTE — Progress Notes (Signed)
Orthopedic Tech Progress Note Patient Details:  Bronson CurbMelvin Robert J Braunschweig 07/13/1955 [id number]  Ortho Devices Type of Ortho Device: Postop shoe/boot Ortho Device/Splint Interventions: Application   Post Interventions Patient Tolerated: Well Instructions Provided: Care of device   Saul FordyceJennifer C Whitt Auletta 12/14/2017, 6:35 PM

## 2017-12-14 NOTE — Anesthesia Procedure Notes (Signed)
Procedure Name: LMA Insertion Date/Time: 12/14/2017 12:08 PM Performed by: Rogelia BogaMueller, Sheila Gervasi P, CRNA Pre-anesthesia Checklist: Patient identified, Emergency Drugs available, Suction available, Patient being monitored and Timeout performed Patient Re-evaluated:Patient Re-evaluated prior to induction Oxygen Delivery Method: Circle system utilized Preoxygenation: Pre-oxygenation with 100% oxygen Induction Type: IV induction LMA: LMA inserted LMA Size: 4.5 Number of attempts: 1 Placement Confirmation: positive ETCO2,  CO2 detector and breath sounds checked- equal and bilateral Tube secured with: Tape Dental Injury: Teeth and Oropharynx as per pre-operative assessment

## 2017-12-14 NOTE — Anesthesia Procedure Notes (Signed)
Anesthesia Regional Block: Popliteal block   Pre-Anesthetic Checklist: ,, timeout performed, Correct Patient, Correct Site, Correct Laterality, Correct Procedure, Correct Position, site marked, Risks and benefits discussed, pre-op evaluation,  At surgeon's request and post-op pain management  Laterality: Left  Prep: Maximum Sterile Barrier Precautions used, chloraprep       Needles:  Injection technique: Single-shot  Needle Type: Echogenic Stimulator Needle     Needle Length: 9cm  Needle Gauge: 21     Additional Needles:   Procedures:,,,, ultrasound used (permanent image in chart),,,,  Narrative:  Start time: 12/14/2017 10:10 AM End time: 12/14/2017 10:22 AM Injection made incrementally with aspirations every 5 mL. Anesthesiologist: Trevor IhaHouser, Stephen A, MD

## 2017-12-14 NOTE — Op Note (Signed)
     Date of Surgery: 12/14/2017  INDICATIONS: [name] is a 62 y.o.-year-old male who is status post revascularization to the left lower extremity.  He has had amputation of the little toe.  With progressive abscess and ulceration with dry gangrene of the big toe.Marland Kitchen.  PREOPERATIVE DIAGNOSIS: Gangrene left forefoot status post revascularization.  POSTOPERATIVE DIAGNOSIS: Same.  PROCEDURE: Transmetatarsal amputation Application of Prevena wound VAC  SURGEON: Lajoyce Cornersuda, M.D.  ANESTHESIA:  general  IV FLUIDS AND URINE: See anesthesia.  ESTIMATED BLOOD LOSS: Minimal mL.  COMPLICATIONS: None.  DESCRIPTION OF PROCEDURE: The patient was brought to the operating room and underwent a general anesthetic. After adequate levels of anesthesia were obtained patient's lower extremity was prepped using DuraPrep draped into a sterile field. A timeout was called.  A fishmouth incision was made just proximal to the ulcerative nonviable tissue. This was carried sharply down to bone. A oscillating saw was used to perform a transmetatarsal amputation with a gentle cascade of the metatarsals and beveled plantarly. Electrocautery was used for hemostasis. The wound was irrigated with normal saline. The incision was closed using 2-0 nylon. A Prevena wound VAC was applied. This had a good suction fit. Patient was taken to the PACU in stable condition.   DISCHARGE PLANNING:  Antibiotic duration: 24 hours  Weightbearing:: Nonweightbearing left lower extremity  Pain medication: high-dose narcotics ordered  Dressing care/ Wound VAC:vac for 1 week  Discharge to: home in 24 hours  Follow-up: In the office 1 week post operative.  Joel BakerMarcus Arbell Wycoff, MD [facility] 12:25 PM

## 2017-12-14 NOTE — Anesthesia Postprocedure Evaluation (Signed)
Anesthesia Post Note  Patient: Joel Hebert  Procedure(s) Performed: LEFT TRANSMETATARSAL AMPUTATION (Left Foot)     Patient location during evaluation: PACU Anesthesia Type: General Level of consciousness: awake and alert Pain management: pain level controlled Vital Signs Assessment: post-procedure vital signs reviewed and stable Respiratory status: spontaneous breathing, nonlabored ventilation, respiratory function stable and patient connected to nasal cannula oxygen Cardiovascular status: blood pressure returned to baseline and stable Postop Assessment: no apparent nausea or vomiting Anesthetic complications: no    Last Vitals:  Vitals:   12/14/17 1250 12/14/17 1305  BP: 129/70 127/70  Pulse: 70 66  Resp: (!) 7 13  Temp: (!) 36.3 C   SpO2: 100% 100%    Last Pain:  Vitals:   12/14/17 1250  TempSrc:   PainSc: 0-No pain                 Trevor IhaStephen A Houser

## 2017-12-14 NOTE — Transfer of Care (Signed)
Immediate Anesthesia Transfer of Care Note  Patient: Joel Hebert  Procedure(s) Performed: LEFT TRANSMETATARSAL AMPUTATION (Left Foot)  Patient Location: PACU  Anesthesia Type:General and GA combined with regional for post-op pain  Level of Consciousness: awake, alert , oriented and patient cooperative  Airway & Oxygen Therapy: Patient Spontanous Breathing and Patient connected to nasal cannula oxygen  Post-op Assessment: Report given to RN, Post -op Vital signs reviewed and stable and Patient moving all extremities X 4  Post vital signs: Reviewed and stable  Last Vitals:  Vitals Value Taken Time  BP 129/70 12/14/2017 12:50 PM  Temp    Pulse 67 12/14/2017 12:52 PM  Resp 10 12/14/2017 12:52 PM  SpO2 100 % 12/14/2017 12:52 PM  Vitals shown include unvalidated device data.  Last Pain:  Vitals:   12/14/17 0955  TempSrc:   PainSc: 10-Worst pain ever      Patients Stated Pain Goal: 4 (12/14/17 0955)  Complications: No apparent anesthesia complications

## 2017-12-14 NOTE — Telephone Encounter (Signed)
Patient had surgery today and wife is wanting a work note since she was with him during surgery. She asked the hospital but they said she would need to get it from here. Please advise Stanton KidneyDebra # [phone]

## 2017-12-15 ENCOUNTER — Encounter (HOSPITAL_COMMUNITY): Payer: Self-pay | Admitting: Orthopedic Surgery

## 2017-12-15 LAB — GLUCOSE, CAPILLARY
GLUCOSE-CAPILLARY: 90 mg/dL (ref 65–99)
GLUCOSE-CAPILLARY: 102 mg/dL — AB (ref 65–99)
Glucose-Capillary: 154 mg/dL — ABNORMAL HIGH (ref 65–99)
Glucose-Capillary: 143 mg/dL — ABNORMAL HIGH (ref 65–99)

## 2017-12-15 NOTE — Progress Notes (Signed)
Patient ID: Joel Hebert, male   DOB: 08/28/1955, 62 y.o.   MRN: [id number] Postoperative day 1 left transmetatarsal amputation.  The wound VAC is functioning well minimal drainage.  Anticipate discharge to skilled nursing.  Nonweightbearing left foot.  Patient was encouraged to keep his foot elevated and not dependent.

## 2017-12-15 NOTE — Progress Notes (Signed)
Pt again educated on elevating L foot but continued to refuse.

## 2017-12-15 NOTE — Progress Notes (Signed)
Pt sitting at the edge of the bed, encouraged multiple times to keep his foot elevated but Pt refused and stated that it is not going to work, it is painful to elevate it. Premedicated prior to having him lay on his back and elevate left foot but Pt said he could not keep the position and went back to sitting, refused SCD on RLE Pt educated.

## 2017-12-15 NOTE — Plan of Care (Signed)

## 2017-12-15 NOTE — Progress Notes (Signed)
PT Cancellation Note  Patient Details Name: Joel Hebert MRN: [id number] DOB: 12/08/1955   Cancelled Treatment:    Reason Eval/Treat Not Completed: Pain limiting ability to participate   Patient sitting at EOB alternating hanging his foot down to the floor and pulling it up on the bed. He reports understanding the need to elevate his foot however he cannot tolerate the pain. Noted pt has had multiple pain medicines and spoke with RN. The only medicine that is due is an IV pain medicine and pt has no IV access at this time. She is requesting IV team. Patient refuses to attempt PT at this time due to severe pain.   Will attempt to see later today if can coordinate with his pain medicine.    Scherrie NovemberLynn P Reighn Kaplan, PT 12/15/2017, 11:19 AM

## 2017-12-16 LAB — GLUCOSE, CAPILLARY
GLUCOSE-CAPILLARY: 135 mg/dL — AB (ref 65–99)
GLUCOSE-CAPILLARY: 115 mg/dL — AB (ref 65–99)
Glucose-Capillary: 113 mg/dL — ABNORMAL HIGH (ref 65–99)
Glucose-Capillary: 124 mg/dL — ABNORMAL HIGH (ref 65–99)

## 2017-12-16 NOTE — Progress Notes (Signed)
At 16:57 temp=98.6, Pt agreed to continue doing IS, SCD on RLE is on, still refused to elevate foot.

## 2017-12-16 NOTE — Evaluation (Signed)
Physical Therapy Evaluation Patient Details Name: Joel Hebert MRN: [id number] DOB: 03/25/1956 Today's Date: 12/16/2017   History of Present Illness  Pt is a 62 y.o.-year-old male who is status post revascularization to the left lower extremity.  He has had amputation of [name] 10/09/17 with progressive abscess and ulceration with dry gangrene of the big toe.  He underwent [name] 12/14/17.     Clinical Impression  Pt admitted with above diagnosis. Pt currently with functional limitations due to the deficits listed below (see PT Problem List). PTA pt lived at home alone, modified independent with mobility using crutches. On eval, he demonstrated modified independence with bed mobility. Min guard assist required for squat pivot transfer bed to recliner. Unable to progress ambulation due to pain. Pt encouraged to keep [name] elevated. He prefers his foot on the floor, reporting elevating the foot increases his pain.  Pt will benefit from skilled PT to increase their independence and safety with mobility to allow discharge to the venue listed below.       Follow Up Recommendations SNF    Equipment Recommendations  Other (comment)(defer to next venue)    Recommendations for Other Services       Precautions / Restrictions Precautions Precautions: Fall;Other (comment) Precaution Comments: wound vac Restrictions [name] Weight Bearing: Non weight bearing      Mobility  Bed Mobility Overal bed mobility: Modified Independent                Transfers Overall transfer level: Needs assistance Equipment used: None Transfers: Squat Pivot Transfers     Squat pivot transfers: Min guard     General transfer comment: pivot transfer bed to recliner, min guard for safety  Ambulation/Gait             General Gait Details: unable due to pain  Stairs            Wheelchair Mobility    Modified Rankin (Stroke Patients Only)       Balance                                              Pertinent Vitals/Pain Pain Assessment: 0-10 Pain Score: 8  Pain Location: L foot Pain Descriptors / Indicators: Grimacing;Guarding;Constant Pain Intervention(s): Limited activity within patient's tolerance;Monitored during session;Premedicated before session    Home Living Family/patient expects to be discharged to:: Private residence Living Arrangements: Alone   Type of Home: House Home Access: Stairs to enter Entrance Stairs-Rails: Doctor, general practice of Steps: 4 Home Layout: Able to live on main level with bedroom/bathroom;Two level Home Equipment: Cane - single point;Walker - 2 wheels;Crutches;Shower seat      Prior Function Level of Independence: Independent with assistive device(s)         Comments: crutches for ambulation     Hand Dominance        Extremity/Trunk Assessment   Upper Extremity Assessment Upper Extremity Assessment: Overall WFL for tasks assessed    Lower Extremity Assessment Lower Extremity Assessment: [name] deficits/detail [name]: Unable to fully assess due to pain    Cervical / Trunk Assessment Cervical / Trunk Assessment: Normal  Communication   Communication: No difficulties  Cognition Arousal/Alertness: Awake/alert Behavior During Therapy: Restless Overall Cognitive Status: Within Functional Limits for tasks assessed  General Comments: Pt perseverating on pain. Refusing elevation of L foot due to complaint that it increases his pain.       General Comments      Exercises     Assessment/Plan    PT Assessment Patient needs continued PT services  PT Problem List Decreased mobility;Decreased safety awareness;Decreased knowledge of precautions;Decreased activity tolerance;Pain;Decreased skin integrity;Decreased balance;Decreased knowledge of use of DME       PT Treatment Interventions DME instruction;Therapeutic  activities;Gait training;Therapeutic exercise;Patient/family education;Balance training;Functional mobility training    PT Goals (Current goals can be found in the Care Plan section)  Acute Rehab PT Goals Patient Stated Goal: decrease pain PT Goal Formulation: With patient Time For Goal Achievement: 12/30/17 Potential to Achieve Goals: Good    Frequency Min 3X/week   Barriers to discharge Decreased caregiver support      Co-evaluation               AM-PAC PT "6 Clicks" Daily Activity  Outcome Measure Difficulty turning over in bed (including adjusting bedclothes, sheets and blankets)?: None Difficulty moving from lying on back to sitting on the side of the bed? : None Difficulty sitting down on and standing up from a chair with arms (e.g., wheelchair, bedside commode, etc,.)?: A Little Help needed moving to and from a bed to chair (including a wheelchair)?: A Little Help needed walking in hospital room?: A Lot Help needed climbing 3-5 steps with a railing? : Total 6 Click Score: 17    End of Session   Activity Tolerance: Patient limited by pain Patient left: in chair;with call bell/phone within reach Nurse Communication: Mobility status PT Visit Diagnosis: Other abnormalities of gait and mobility (R26.89);Pain;Difficulty in walking, not elsewhere classified (R26.2) Pain - Right/Left: Left Pain - part of body: [name]    Time: 0850-0903 PT Time Calculation (min) (ACUTE ONLY): 13 min   Charges:   PT Evaluation $PT Eval Moderate Complexity: 1 Mod     PT G Codes:        Aida RaiderWendy Bartlomiej Jenkinson, PT  Office # [phone] Pager [phone]   Ilda FoilGarrow, Joannie Medine Rene 12/16/2017, 9:20 AM

## 2017-12-16 NOTE — Plan of Care (Signed)
  Problem: Nutrition: Goal: Adequate nutrition will be maintained Outcome: Progressing   Problem: Elimination: Goal: Will not experience complications related to bowel motility Outcome: Progressing   Problem: Pain Managment: Goal: General experience of comfort will improve Outcome: Progressing   Problem: Safety: Goal: Ability to remain free from injury will improve Outcome: Progressing   

## 2017-12-16 NOTE — Progress Notes (Signed)
Pt educated on foot elevation but continued to refuse stated elevating his leg does not help with his pain, offered ice packs to help with pain but he refused. Pt agreed to SCD on RLE, educated and demonstrated understanding in doing IS. Pt's temp at 101.3 administered PRN med, rechecked after an hr temp at 99.3, adjusted room temp, encouraged IS. Will continue to monitor.

## 2017-12-17 ENCOUNTER — Other Ambulatory Visit (INDEPENDENT_AMBULATORY_CARE_PROVIDER_SITE_OTHER): Payer: Self-pay

## 2017-12-17 LAB — GLUCOSE, CAPILLARY
Glucose-Capillary: 121 mg/dL — ABNORMAL HIGH (ref 65–99)
Glucose-Capillary: 102 mg/dL — ABNORMAL HIGH (ref 65–99)
Glucose-Capillary: 150 mg/dL — ABNORMAL HIGH (ref 65–99)
Glucose-Capillary: 120 mg/dL — ABNORMAL HIGH (ref 65–99)

## 2017-12-17 NOTE — Social Work (Addendum)
Pt is uninsured and will need LOG SNF placement.  CSW f/u  for approval for LOG placement and for SNF bed offers.  CSW will continue to follow for placement.  Keene BreathPatricia Christabel Camire, LCSW Clinical Social Worker [phone]

## 2017-12-17 NOTE — NC FL2 (Signed)
[place] MEDICAID FL2 LEVEL OF CARE SCREENING TOOL     IDENTIFICATION  Patient Name: Joel Hebert Birthdate: 12/26/1955 Sex: male Admission Date (Current Location): 12/14/2017  [facility] [place]   Facility and Address:  [facility], [address], [place], [place] [address]      Provider Number: [id number]  Attending Physician Name and Address:  Nadara Mustarduda, Marcus V, MD  Relative Name and Phone Number:  Ayesha RumpfDebra Delahoz, ex spouse, [phone]    Current Level of Care: Hospital Recommended Level of Care: Skilled Nursing Facility Prior Approval Number:    Date Approved/Denied:   PASRR Number: [id number]  Discharge Plan: SNF    Current Diagnoses: Patient Active Problem List   Diagnosis Date Noted  . History of transmetatarsal amputation of left foot (HCC) 12/14/2017  . Gangrene of left foot (HCC)   . Atherosclerosis of native artery of left lower extremity with gangrene (HCC) 11/19/2017  . Peripheral artery disease (HCC) 11/08/2017  . PAD (peripheral artery disease) (HCC) 08/14/2017    Orientation RESPIRATION BLADDER Height & Weight     Self, Time, Situation, Place  Normal Continent Weight: 200 lb (90.7 kg) Height:  5\' 8"  (172.7 cm)  BEHAVIORAL SYMPTOMS/MOOD NEUROLOGICAL BOWEL NUTRITION STATUS      Continent Diet(See Dc Summary)  AMBULATORY STATUS COMMUNICATION OF NEEDS Skin   Limited Assist Verbally Wound Vac, Surgical wounds                       Personal Care Assistance Level of Assistance  Dressing, Feeding, Bathing Bathing Assistance: Limited assistance Feeding assistance: Independent Dressing Assistance: Limited assistance     Functional Limitations Info  Sight, Hearing, Speech Sight Info: Adequate Hearing Info: Adequate Speech Info: Adequate    SPECIAL CARE FACTORS FREQUENCY  PT (By licensed PT), OT (By licensed OT)     PT Frequency: 3x week OT Frequency: 3x week            Contractures       Additional Factors Info  Code Status, Allergies, Insulin Sliding Scale Code Status Info: Full Allergies Info: No Known Allergies   Insulin Sliding Scale Info: Insulin Daily       Current Medications (12/17/2017):  This is the current hospital active medication list Current Facility-Administered Medications  Medication Dose Route Frequency Provider Last Rate Last Dose  . 0.9 %  sodium chloride infusion   Intravenous Continuous Nadara Mustarduda, Marcus V, MD 10 mL/hr at 12/14/17 1628    . acetaminophen (TYLENOL) tablet 325-650 mg  325-650 mg Oral Q6H PRN Nadara Mustarduda, Marcus V, MD   650 mg at 12/16/17 1438  . acetaminophen (TYLENOL) tablet 500 mg  500 mg Oral Q6H PRN Nadara Mustarduda, Marcus V, MD   500 mg at 12/15/17 0934  . bisacodyl (DULCOLAX) suppository 10 mg  10 mg Rectal Daily PRN Nadara Mustarduda, Marcus V, MD      . clopidogrel (PLAVIX) tablet 75 mg  75 mg Oral Q breakfast Nadara Mustarduda, Marcus V, MD   75 mg at 12/17/17 [id number]  . docusate sodium (COLACE) capsule 100 mg  100 mg Oral BID Nadara Mustarduda, Marcus V, MD   100 mg at 12/17/17 [id number]  . gabapentin (NEURONTIN) capsule 300 mg  300 mg Oral BID Nadara Mustarduda, Marcus V, MD   300 mg at 12/17/17 [id number]  . lisinopril (PRINIVIL,ZESTRIL) tablet 20 mg  20 mg Oral Daily Nadara Mustarduda, Marcus V, MD   20 mg at 12/17/17 [id number]   And  .  hydrochlorothiazide (HYDRODIURIL) tablet 25 mg  25 mg Oral Daily Nadara Mustard, MD   25 mg at 12/17/17 2542  . HYDROmorphone (DILAUDID) injection 0.5-1 mg  0.5-1 mg Intravenous Q4H PRN Nadara Mustard, MD   1 mg at 12/16/17 0216  . insulin aspart (novoLOG) injection 0-15 Units  0-15 Units Subcutaneous TID WC Nadara Mustard, MD   2 Units at 12/17/17 [phone]  . magnesium citrate solution 1 Bottle  1 Bottle Oral Once PRN Nadara Mustard, MD      . metFORMIN (GLUCOPHAGE) tablet 850 mg  850 mg Oral BID WC Nadara Mustard, MD   850 mg at 12/17/17 3762  . methocarbamol (ROBAXIN) tablet 500 mg  500 mg Oral Q6H PRN Nadara Mustard, MD   500 mg at 12/17/17 8315   Or  . methocarbamol (ROBAXIN) 500 mg in  dextrose 5 % 50 mL IVPB  500 mg Intravenous Q6H PRN Nadara Mustard, MD      . metoCLOPramide (REGLAN) tablet 5-10 mg  5-10 mg Oral Q8H PRN Nadara Mustard, MD       Or  . metoCLOPramide (REGLAN) injection 5-10 mg  5-10 mg Intravenous Q8H PRN Nadara Mustard, MD      . metoprolol tartrate (LOPRESSOR) tablet 100 mg  100 mg Oral BID Nadara Mustard, MD   100 mg at 12/17/17 0834  . nitroGLYCERIN (NITRODUR - Dosed in mg/24 hr) patch 0.2 mg  0.2 mg Transdermal Daily Nadara Mustard, MD   0.2 mg at 12/17/17 0835  . nitroGLYCERIN (NITROSTAT) SL tablet 0.4 mg  0.4 mg Sublingual Q5 min PRN Nadara Mustard, MD   0.4 mg at 12/14/17 1900  . ondansetron (ZOFRAN) tablet 4 mg  4 mg Oral Q6H PRN Nadara Mustard, MD       Or  . ondansetron [facility]) injection 4 mg  4 mg Intravenous Q6H PRN Nadara Mustard, MD      . oxyCODONE (Oxy IR/ROXICODONE) immediate release tablet 10-15 mg  10-15 mg Oral Q4H PRN Nadara Mustard, MD   15 mg at 12/17/17 1226  . oxyCODONE (Oxy IR/ROXICODONE) immediate release tablet 5-10 mg  5-10 mg Oral Q4H PRN Nadara Mustard, MD   5 mg at 12/14/17 1643  . pentoxifylline (TRENTAL) CR tablet 400 mg  400 mg Oral TID WC Nadara Mustard, MD   400 mg at 12/17/17 1226  . polyethylene glycol (MIRALAX / GLYCOLAX) packet 17 g  17 g Oral Daily PRN Nadara Mustard, MD   17 g at 12/17/17 1761  . rosuvastatin (CRESTOR) tablet 20 mg  20 mg Oral QPM Nadara Mustard, MD   20 mg at 12/16/17 1702     Discharge Medications: Please see discharge summary for a list of discharge medications.  Relevant Imaging Results:  Relevant Lab Results:   Additional Information SS#: [id number] [address]  Tresa Moore, LCSW

## 2017-12-17 NOTE — Clinical Social Work Note (Signed)
Clinical Social Work Assessment  Patient Details  Name: Joel Hebert MRN: [id number] Date of Birth: 1956-04-28  Date of referral:  12/17/17               Reason for consult:  Facility Placement                Permission sought to share information with:  Facility Art therapist granted to share information::     Name::        Agency::  SNF  Relationship::     Contact Information:     Housing/Transportation Living arrangements for the past 2 months:  Single Family Home Source of Information:  Patient Patient Interpreter Needed:  None Criminal Activity/Legal Involvement Pertinent to Current Situation/Hospitalization:  No - Comment as needed Significant Relationships:  Other Family Members, Spouse Lives with:  Self Do you feel safe going back to the place where you live?  No Need for family participation in patient care:  No (Coment)  Care giving concerns:  Pt uninsured and resides alone and PT recommending skilled nursing.  Social Worker assessment / plan:  CSW met with patient at bedside to discuss the plan for disposition. Pt confirmed that he ws modified independent prior to hospitalization. He is agreeable to SNF. Pt has been to SNF recently for initial impairment and was covered via LOG SNF bed. CSW explained SNF process and placement. Pt in agreement. CSW discussed barriers.  CSW will f/u with asst. clinical supervisor for LOG approval.  Employment status:  Disabled (Comment on whether or not currently receiving Disability) Insurance information:  Self Pay (Medicaid Pending) PT Recommendations:  [place] / Referral to community resources:  [place]  Patient/Family's Response to care:  Pt appreciative.  Patient/Family's Understanding of and Emotional Response to Diagnosis, Current Treatment, and Prognosis:  Pt has good understanding of impairment. Pt reports that he is "tired" of having to keep returning  to hospital for his impairment. CSW validated and discussed what he can do to improve his outcomes.  Pt is ready to get back to normalcy as well. CSW provided supportive listening skills.  Emotional Assessment Appearance:  Appears stated age Attitude/Demeanor/Rapport:  (Cooperative) Affect (typically observed):  Accepting, Appropriate Orientation:  Oriented to Self, Oriented to Situation, Oriented to Place, Oriented to  Time Alcohol / Substance use:  Not Applicable Psych involvement (Current and /or in the community):  No (Comment)  Discharge Needs  Concerns to be addressed:  Discharge Planning Concerns Readmission within the last 30 days:  No Current discharge risk:  Dependent with Mobility, Physical Impairment Barriers to Discharge:  No Barriers Identified   Normajean Baxter, LCSW 12/17/2017, 5:10 PM

## 2017-12-17 NOTE — Telephone Encounter (Signed)
I called and advised that letter is ready for pick up at the front desk.

## 2017-12-17 NOTE — Progress Notes (Signed)
Patient ID: Joel Hebert, male   DOB: 06/02/1956, 62 y.o.   MRN: [id number] Patient sitting with his leg dependent.  Again discussed the importance of elevation.  Will reconsult social work for discharge to skilled nursing.

## 2017-12-17 NOTE — Plan of Care (Signed)

## 2017-12-18 ENCOUNTER — Other Ambulatory Visit (INDEPENDENT_AMBULATORY_CARE_PROVIDER_SITE_OTHER): Payer: Self-pay | Admitting: Orthopedic Surgery

## 2017-12-18 LAB — GLUCOSE, CAPILLARY
Glucose-Capillary: 116 mg/dL — ABNORMAL HIGH (ref 65–99)
Glucose-Capillary: 103 mg/dL — ABNORMAL HIGH (ref 65–99)

## 2017-12-18 MED ORDER — INSULIN ASPART 100 UNIT/ML ~~LOC~~ SOLN: 0.0000 [IU] | Freq: Three times a day (TID) | SUBCUTANEOUS | 11 refills | Status: DC

## 2017-12-18 MED ORDER — METHOCARBAMOL 500 MG PO TABS: 500.0000 mg | ORAL_TABLET | Freq: Three times a day (TID) | ORAL | 0 refills | Status: DC | PRN

## 2017-12-18 MED ORDER — METHOCARBAMOL 500 MG PO TABS: 500.0000 mg | ORAL_TABLET | Freq: Three times a day (TID) | ORAL | 0 refills | Status: AC | PRN

## 2017-12-18 MED ORDER — OXYCODONE-ACETAMINOPHEN 10-325 MG PO TABS: 1.0000 | ORAL_TABLET | ORAL | 0 refills | Status: AC | PRN

## 2017-12-18 NOTE — Social Work (Signed)
Clinical Social Worker facilitated patient discharge including contacting patient family and facility to confirm patient discharge plans.  Clinical information faxed to facility and family agreeable with plan.    CSW arranged ambulance transport via [facility] to [facility].    RN to call [phone] to give report prior to discharge.  Clinical Social Worker will sign off for now as social work intervention is no longer needed. Please consult us again if new need arises.  Dayanara Sherrill, LCSW Clinical Social Worker [phone]    

## 2017-12-18 NOTE — Progress Notes (Signed)
Physical Therapy Treatment Patient Details Name: Joel Hebert MRN: [id number] DOB: 11/12/1955 Today's Date: 12/18/2017    History of Present Illness Pt is a 62 y.o.-year-old male who is status post revascularization to the left lower extremity.  He has had amputation of [name] 10/09/17 with progressive abscess and ulceration with dry gangrene of the big toe.  He underwent [name] 12/14/17.     PT Comments    Pt performed gait training this session and able to tolerate mobility progression to gait.  He was limited in distance due to pain.  Pt required cues for safety with the use of the RW.  Educated on LE elevation to improve edema management and he remains to decline elevation due to pain.  Pt remains to require short term SNF placement to improve strength and function before returning to private residence.  Plan for d/c to SNF this pm.     Follow Up Recommendations  SNF     Equipment Recommendations  Other (comment)(defer to next venue)    Recommendations for Other Services       Precautions / Restrictions Precautions Precautions: Fall;Other (comment) Precaution Comments: wound vac Restrictions Weight Bearing Restrictions: Yes [name] Weight Bearing: Non weight bearing    Mobility  Bed Mobility Overal bed mobility: Modified Independent                Transfers Overall transfer level: Needs assistance Equipment used: Rolling walker (2 wheeled) Transfers: Sit to/from Stand Sit to Stand: Min guard         General transfer comment: Cues for hand placement to and from seated surface.    Ambulation/Gait Ambulation/Gait assistance: Min guard Ambulation Distance (Feet): 24 Feet Assistive device: Rolling walker (2 wheeled) Gait Pattern/deviations: Step-to pattern;Trunk flexed(hop to pattern)     General Gait Details: Cues for sequencing and technique.  Pt with good upper body use.  Cues for safety with RW and position with RW.  Pt remains  limited in distance due to pain and endurance.     Stairs             Wheelchair Mobility    Modified Rankin (Stroke Patients Only)       Balance Overall balance assessment: Mild deficits observed, not formally tested                                          Cognition Arousal/Alertness: Awake/alert Behavior During Therapy: Restless Overall Cognitive Status: Within Functional Limits for tasks assessed                                 General Comments: Pt perseverating on pain. Refusing elevation of L foot due to complaint that it increases his pain.       Exercises General Exercises - Lower Extremity Quad Sets: AROM;Both;10 reps;Supine Long Arc Quad: AROM;Both;10 reps;Seated Hip Flexion/Marching: AROM;Both;10 reps;Seated    General Comments        Pertinent Vitals/Pain Pain Assessment: 0-10 Pain Score: 8  Pain Location: L foot Pain Descriptors / Indicators: Grimacing;Guarding;Constant Pain Intervention(s): Monitored during session;Repositioned;Ice applied    Home Living                      Prior Function  PT Goals (current goals can now be found in the care plan section) Acute Rehab PT Goals Patient Stated Goal: decrease pain Potential to Achieve Goals: Good Progress towards PT goals: Progressing toward goals    Frequency    Min 3X/week      PT Plan Current plan remains appropriate    Co-evaluation              AM-PAC PT "6 Clicks" Daily Activity  Outcome Measure  Difficulty turning over in bed (including adjusting bedclothes, sheets and blankets)?: None Difficulty moving from lying on back to sitting on the side of the bed? : None Difficulty sitting down on and standing up from a chair with arms (e.g., wheelchair, bedside commode, etc,.)?: A Little Help needed moving to and from a bed to chair (including a wheelchair)?: A Little Help needed walking in hospital room?: A Little Help  needed climbing 3-5 steps with a railing? : A Lot 6 Click Score: 19    End of Session Equipment Utilized During Treatment: Gait belt Activity Tolerance: Patient limited by pain Patient left: in chair;with call bell/phone within reach Nurse Communication: Mobility status PT Visit Diagnosis: Other abnormalities of gait and mobility (R26.89);Pain;Difficulty in walking, not elsewhere classified (R26.2) Pain - Right/Left: Left Pain - part of body: [name]     Time: 1146-1209 PT Time Calculation (min) (ACUTE ONLY): 23 min  Charges:  $Gait Training: 8-22 mins $Therapeutic Exercise: 8-22 mins                    G Codes:       Joel Hebert, PTA pager [phone]\    Joel Hebert 12/18/2017, 12:18 PM

## 2017-12-18 NOTE — Clinical Social Work Placement (Signed)
   CLINICAL SOCIAL WORK PLACEMENT  NOTE  Date:  12/18/2017  Patient Details  Name: Joel Hebert MRN: [id number] Date of Birth: 06/21/1956  Clinical Social Work is seeking post-discharge placement for this patient at the Skilled  Nursing Facility level of care (*CSW will initial, date and re-position this form in  chart as items are completed):  Yes   Patient/family provided with [facility] Clinical Social Work Department's list of facilities offering this level of care within the geographic area requested by the patient (or if unable, by the patient's family).  Yes   Patient/family informed of their freedom to choose among providers that offer the needed level of care, that participate in Medicare, Medicaid or managed care program needed by the patient, have an available bed and are willing to accept the patient.  Yes   Patient/family informed of [facility]'s ownership interest in [facility] and [facility], as well as of the fact that they are under no obligation to receive care at these facilities.  PASRR submitted to EDS on       PASRR number received on       Existing PASRR number confirmed on 12/17/17     FL2 transmitted to all facilities in geographic area requested by pt/family on       FL2 transmitted to all facilities within larger geographic area on 12/17/17     Patient informed that his/her managed care company has contracts with or will negotiate with certain facilities, including the following:        Yes   Patient/family informed of bed offers received.  Patient chooses bed at ([facility])     Physician recommends and patient chooses bed at      Patient to be transferred to ([facility]) on 12/18/17.  Patient to be transferred to facility by [facility]     Patient family notified on 12/18/17 of transfer.  Name of family member notified:  pt responsible for self     PHYSICIAN       Additional Comment:     _______________________________________________ Tresa MoorePatricia V Luz Burcher, LCSW 12/18/2017, 11:48 AM

## 2017-12-18 NOTE — Progress Notes (Signed)
Patient ID: Joel Hebert, male   DOB: 09/16/1955, 62 y.o.   MRN: [id number] Awaiting placement at skilled nursing facility.  Patient is sitting in a chair with his legs dependent.  Again discussed the importance of elevating his legs for wound healing.  Patient has significant venous stasis swelling in both lower extremities.

## 2017-12-18 NOTE — Social Work (Addendum)
CSW called admission staff at [facility] to see if they can review clinicals and offer SNF bed.  CSW f/u for placement as patient uninsured.  CSW called financial counseling and left message to f/u with insurance.  CSW will continue to follow up.  10:30am: SNF [facility] has offered SNF bed for placement. CSW was approved for [facility] SN[facility] bed by Asst.Clin. Dir. Z.Brooks.  CSW will f/u for disposition.  Joel BreathPatricia Halcyon Heck, LCSW Clinical Social Worker [phone]

## 2017-12-18 NOTE — Progress Notes (Signed)
No BM for 5 days, MgCitrate was given. Pt had large BM x2. Report was given to [place] [facility]. Pt was discharged to SNF via [facility].

## 2017-12-18 NOTE — Discharge Summary (Signed)
Discharge Diagnoses:  Active Problems:   Gangrene of left foot (HCC)   History of transmetatarsal amputation of left foot (HCC)   Surgeries: Procedure(s): LEFT TRANSMETATARSAL AMPUTATION on 12/14/2017    Consultants:   Discharged Condition: Improved  Hospital Course: Joel Hebert is an 62 y.o. male who was admitted 12/14/2017 with a chief complaint of gangrene left foot, with a final diagnosis of Gangrene Left Foot.  Patient was brought to the operating room on 12/14/2017 and underwent Procedure(s): LEFT TRANSMETATARSAL AMPUTATION.    Patient was given perioperative antibiotics:  Anti-infectives (From admission, onward)   Start     Dose/Rate Route Frequency Ordered Stop   12/14/17 1800  ceFAZolin (ANCEF) IVPB 2g/100 mL premix     2 g 200 mL/hr over 30 Minutes Intravenous Every 6 hours 12/14/17 1607 12/15/17 0548   12/14/17 0930  ceFAZolin (ANCEF) IVPB 2g/100 mL premix     2 g 200 mL/hr over 30 Minutes Intravenous On call to O.R. 12/14/17 [id number] 12/14/17 1225    .  Patient was given sequential compression devices, early ambulation, and aspirin for DVT prophylaxis.  Recent vital signs:  Patient Vitals for the past 24 hrs:  BP Temp Temp src Pulse Resp SpO2  12/18/17 0550 135/64 98.5 F (36.9 C) Oral 76 18 99 %  12/17/17 2016 139/70 99.2 F (37.3 C) Oral 90 18 97 %  12/17/17 1420 139/63 (!) 100.4 F (38 C) Oral 88 16 97 %  .  Recent laboratory studies: No results found.  Discharge Medications:   Allergies as of 12/18/2017   No Known Allergies     Medication List    TAKE these medications   acetaminophen 500 MG tablet Commonly known as:  TYLENOL Take 500 mg by mouth every 6 (six) hours as needed for mild pain or headache.   aspirin EC 81 MG tablet Take 81 mg by mouth 2 (two) times daily.   clopidogrel 75 MG tablet Commonly known as:  PLAVIX Take 1 tablet (75 mg total) by mouth daily with breakfast.   diclofenac 50 MG EC tablet Commonly known as:   VOLTAREN Take 1 tablet (50 mg total) by mouth 2 (two) times daily.   gabapentin 300 MG capsule Commonly known as:  NEURONTIN Take 1 capsule (300 mg total) by mouth 2 (two) times daily.   insulin aspart 100 UNIT/ML injection Commonly known as:  novoLOG Inject 0-15 Units into the skin 3 (three) times daily with meals.   lisinopril-hydrochlorothiazide 20-25 MG tablet Commonly known as:  PRINZIDE,ZESTORETIC Take 1 tablet by mouth daily.   metFORMIN 850 MG tablet Commonly known as:  GLUCOPHAGE Take 850 mg by mouth 2 (two) times daily with a meal.   methocarbamol 500 MG tablet Commonly known as:  ROBAXIN Take 1 tablet (500 mg total) by mouth every 8 (eight) hours as needed for muscle spasms.   metoprolol tartrate 100 MG tablet Commonly known as:  LOPRESSOR Take 100 mg by mouth 2 (two) times daily.   nitroGLYCERIN 0.4 MG SL tablet Commonly known as:  NITROSTAT Place 0.4 mg under the tongue every 5 (five) minutes as needed for chest pain.   nitroGLYCERIN 0.2 mg/hr patch Commonly known as:  NITRODUR - Dosed in mg/24 hr Place 1 patch (0.2 mg total) onto the skin daily.   oxyCODONE-acetaminophen 10-325 MG tablet Commonly known as:  PERCOCET Take 1 tablet by mouth every 8 (eight) hours as needed for pain. What changed:  Another medication with the same name was  removed. Continue taking this medication, and follow the directions you see here.   pentoxifylline 400 MG CR tablet Commonly known as:  TRENTAL Take 1 tablet (400 mg total) by mouth 3 (three) times daily with meals. What changed:  when to take this   rosuvastatin 20 MG tablet Commonly known as:  CRESTOR Take 20 mg by mouth every evening.            Discharge Care Instructions  (From admission, onward)        Start     Ordered   12/18/17 0000  Touch down weight bearing    Question Answer Comment  Laterality left   Extremity Lower      12/18/17 1132      Diagnostic Studies: No results found.  Patient  benefited maximally from their hospital stay and there were no complications.     Disposition: Discharge disposition: 03-Skilled Nursing Facility      Discharge Instructions    Call MD / Call 911   Complete by:  As directed    If you experience chest pain or shortness of breath, CALL 911 and be transported to the hospital emergency room.  If you develope a fever above 101 F, pus (white drainage) or increased drainage or redness at the wound, or calf pain, call your surgeon's office.   Constipation Prevention   Complete by:  As directed    Drink plenty of fluids.  Prune juice may be helpful.  You may use a stool softener, such as Colace (over the counter) 100 mg twice a day.  Use MiraLax (over the counter) for constipation as needed.   Diet - low sodium heart healthy   Complete by:  As directed    Elevate operative extremity   Complete by:  As directed    Keep leg elevated at all times   Increase activity slowly as tolerated   Complete by:  As directed    Negative Pressure Wound Therapy - Incisional   Complete by:  As directed    Continue pravena vac for 1 week then remove vac dressing and start dry dressing changes   Touch down weight bearing   Complete by:  As directed    Laterality:  left   Extremity:  Lower     Follow-up Information    Nadara Mustard, MD In 1 week.   Specialty:  Orthopedic Surgery Contact information: [address] [place] [address] [phone]            Signed: Nadara Mustard 12/18/2017, 11:32 AM

## 2017-12-27 ENCOUNTER — Encounter (INDEPENDENT_AMBULATORY_CARE_PROVIDER_SITE_OTHER): Payer: Self-pay | Admitting: Orthopedic Surgery

## 2017-12-27 ENCOUNTER — Ambulatory Visit (INDEPENDENT_AMBULATORY_CARE_PROVIDER_SITE_OTHER): Payer: Self-pay | Admitting: Orthopedic Surgery

## 2017-12-27 VITALS — Ht 68.0 in | Wt 200.0 lb

## 2017-12-27 DIAGNOSIS — T8781 Dehiscence of amputation stump: Secondary | ICD-10-CM

## 2017-12-27 NOTE — Progress Notes (Signed)
Office Visit Note   Patient: Joel Hebert           Date of Birth: [birth year]           MRN: [id number] Visit Date: 12/27/2017              Requested by: No referring provider defined for this encounter. PCP: Patient, No Pcp Per  Chief Complaint  Patient presents with  . Left Foot - Routine Post Op    12/14/17 left foot transmet amputation       HPI: Patient is a 62 year old gentleman who presents 2 weeks status post left transmetatarsal amputation.  Patient has been using a nitroglycerin patch, Trental, with daily wound dressing changes.  Patient has had progressive wound dehiscence and dry gangrenous changes.  Assessment & Plan: Visit Diagnoses:  1. Dehiscence of amputation stump (HCC)     Plan: Discussed with patient with progressive ischemic changes to the transmetatarsal amputation and there are not any further foot salvage interventions optional.  I recommend proceeding with a transtibial amputation we will set this up for a week from Friday.  Anticipate hospitalization for 3 days with return to skilled nursing.  Follow-Up Instructions: Return in about 2 weeks (around 01/10/2018).   Ortho Exam  Patient is alert, oriented, no adenopathy, well-dressed, normal affect, normal respiratory effort. Patient has good hair growth to the mid tibia.  He has venous stasis swelling in both lower extremities.  Patient has had progressive wound dehiscence of the left transmetatarsal amputation with ischemic gangrenous changes of the dorsal flap with wound dehiscence.  There is no ascending cellulitis.  Imaging: No results found. No images are attached to the encounter.  Labs: Lab Results  Component Value Date   HGBA1C 7.2 (H) 12/14/2017   HGBA1C 7.0 (H) 10/09/2017     Lab Results  Component Value Date   ALBUMIN 3.8 11/06/2017    Body mass index is 30.41 kg/m.  Orders:  No orders of the defined types were placed in this encounter.  No orders of the defined types  were placed in this encounter.    Procedures: No procedures performed  Clinical Data: No additional findings.  ROS:  All other systems negative, except as noted in the HPI. Review of Systems  Objective: Vital Signs: Ht 5\' 8"  (1.727 m)   Wt 200 lb (90.7 kg)   BMI 30.41 kg/m   Specialty Comments:  No specialty comments available.  PMFS History: Patient Active Problem List   Diagnosis Date Noted  . Dehiscence of amputation stump (HCC) 12/27/2017  . History of transmetatarsal amputation of left foot (HCC) 12/14/2017  . Gangrene of left foot (HCC)   . Atherosclerosis of native artery of left lower extremity with gangrene (HCC) 11/19/2017  . Peripheral artery disease (HCC) 11/08/2017  . PAD (peripheral artery disease) (HCC) 08/14/2017   Past Medical History:  Diagnosis Date  . Coronary artery disease   . High cholesterol   . Hypertension   . MI (myocardial infarction) (HCC) 08/13/2014; 01/2016- 03/2016 X 3  . PAD (peripheral artery disease) (HCC)   . Peripheral vascular disease (HCC)   . Type II diabetes mellitus (HCC)     Family History  Problem Relation Age of Onset  . Heart failure Brother   . Heart disease Brother   . Kidney disease Mother   . Heart disease Father     Past Surgical History:  Procedure Laterality Date  . ABDOMINAL AORTOGRAM W/LOWER EXTREMITY  08/14/2017  .  ABDOMINAL AORTOGRAM W/LOWER EXTREMITY N/A 08/14/2017   Procedure: ABDOMINAL AORTOGRAM W/LOWER EXTREMITY;  Surgeon: Nada LibmanBrabham, Vance W, MD;  Location: [facility];  Service: Cardiovascular;  Laterality: N/A;  . ABDOMINAL AORTOGRAM W/LOWER EXTREMITY N/A 09/05/2017   Procedure: ABDOMINAL AORTOGRAM W/LOWER EXTREMITY;  Surgeon: Maeola Harmanain, Brandon Christopher, MD;  [facility];  Service: Cardiovascular;  Laterality: N/A;  . AMPUTATION Left 10/09/2017   Procedure: AMPUTATION DIGIT LEFT FIFTH TOE;  Surgeon: Maeola Harmanain, Brandon Christopher, MD;  [facility];  Service: Vascular;  Laterality: Left;   . AMPUTATION Left 12/14/2017   Procedure: LEFT TRANSMETATARSAL AMPUTATION;  Surgeon: Nadara Mustarduda, Zala Degrasse V, MD;  [facility];  Service: Orthopedics;  Laterality: Left;  . AMPUTATION TOE Left 10/09/2017   left foot 5th toe  . CORONARY ANGIOPLASTY WITH STENT PLACEMENT  01/2016; 03/2016   "1 + 1" (08/14/2017)  . LOWER EXTREMITY ANGIOGRAPHY N/A 08/20/2017   Procedure: LOWER EXTREMITY ANGIOGRAPHY-rt leg;  Surgeon: Sherren KernsFields, Charles E, MD;  [facility];  Service: Cardiovascular;  Laterality: N/A;  . PERIPHERAL VASCULAR ATHERECTOMY  08/14/2017   Procedure: PERIPHERAL VASCULAR ATHERECTOMY;  Surgeon: Nada LibmanBrabham, Vance W, MD;  Location: [facility];  Service: Cardiovascular;;  SFA   . PERIPHERAL VASCULAR ATHERECTOMY Left 09/05/2017   Procedure: PERIPHERAL VASCULAR ATHERECTOMY;  Surgeon: Maeola Harmanain, Brandon Christopher, MD;  [facility];  Service: Cardiovascular;  Laterality: Left;  Tibioperoneal and posterior tibial  . PERIPHERAL VASCULAR BALLOON ANGIOPLASTY  08/14/2017   Procedure: PERIPHERAL VASCULAR BALLOON ANGIOPLASTY;  Surgeon: Nada LibmanBrabham, Vance W, MD;  Location: [facility];  Service: Cardiovascular;;  peroneal  . PERIPHERAL VASCULAR BALLOON ANGIOPLASTY  08/20/2017   Procedure: PERIPHERAL VASCULAR BALLOON ANGIOPLASTY;  Surgeon: Sherren KernsFields, Charles E, MD;  Location: [facility];  Service: Cardiovascular;;  . PERIPHERAL VASCULAR INTERVENTION  08/14/2017   Procedure: PERIPHERAL VASCULAR INTERVENTION;  Surgeon: Nada LibmanBrabham, Vance W, MD;  Location: [facility];  Service: Cardiovascular;;  SFA stent  . TONSILLECTOMY    . WOUND DEBRIDEMENT Left 11/06/2017   Procedure: DEBRIDEMENT WOUND TOE BED OF FIFTH TOE LEFT FOOT;  Surgeon: Maeola Harmanain, Brandon Christopher, MD;  [facility];  Service: Vascular;  Laterality: Left;   Social History   Occupational History  . Not on file  Tobacco Use  . Smoking status: Current Some Day Smoker    Packs/day: 0.25    Years: 43.00    Pack years:  10.75    Types: Cigarettes    Start date: 08/12/1974  . Smokeless tobacco: Never Used  . Tobacco comment: 2-3 cigarettes occasionally  Substance and Sexual Activity  . Alcohol use: Not Currently    Comment: 08/14/2017 "nothing since ~ 2012"  . Drug use: No  . Sexual activity: Not Currently

## 2017-12-31 ENCOUNTER — Other Ambulatory Visit (INDEPENDENT_AMBULATORY_CARE_PROVIDER_SITE_OTHER): Payer: Self-pay | Admitting: Orthopedic Surgery

## 2017-12-31 DIAGNOSIS — T8781 Dehiscence of amputation stump: Secondary | ICD-10-CM

## 2018-01-03 ENCOUNTER — Inpatient Hospital Stay (HOSPITAL_COMMUNITY)
Admission: EM | Admit: 2018-01-03 | Discharge: 2018-01-23 | DRG: 853 | Disposition: A | Discharge status: Skilled Nursing Facility | Payer: Medicaid Other | Source: Skilled Nursing Facility | Attending: Internal Medicine | Admitting: Internal Medicine

## 2018-01-03 ENCOUNTER — Inpatient Hospital Stay (HOSPITAL_COMMUNITY): Payer: Medicaid Other

## 2018-01-03 ENCOUNTER — Emergency Department (HOSPITAL_COMMUNITY): Payer: Medicaid Other

## 2018-01-03 ENCOUNTER — Encounter (HOSPITAL_COMMUNITY): Payer: Self-pay | Admitting: Emergency Medicine

## 2018-01-03 ENCOUNTER — Other Ambulatory Visit (HOSPITAL_COMMUNITY): Payer: Self-pay

## 2018-01-03 DIAGNOSIS — A419 Sepsis, unspecified organism: Secondary | ICD-10-CM | POA: Diagnosis present

## 2018-01-03 DIAGNOSIS — Z9889 Other specified postprocedural states: Secondary | ICD-10-CM

## 2018-01-03 DIAGNOSIS — E1152 Type 2 diabetes mellitus with diabetic peripheral angiopathy with gangrene: Secondary | ICD-10-CM | POA: Diagnosis present

## 2018-01-03 DIAGNOSIS — N186 End stage renal disease: Secondary | ICD-10-CM | POA: Diagnosis present

## 2018-01-03 DIAGNOSIS — Z7982 Long term (current) use of aspirin: Secondary | ICD-10-CM

## 2018-01-03 DIAGNOSIS — E669 Obesity, unspecified: Secondary | ICD-10-CM | POA: Diagnosis present

## 2018-01-03 DIAGNOSIS — Z899 Acquired absence of limb, unspecified: Secondary | ICD-10-CM | POA: Diagnosis not present

## 2018-01-03 DIAGNOSIS — E7800 Pure hypercholesterolemia, unspecified: Secondary | ICD-10-CM | POA: Diagnosis present

## 2018-01-03 DIAGNOSIS — I2510 Atherosclerotic heart disease of native coronary artery without angina pectoris: Secondary | ICD-10-CM | POA: Diagnosis present

## 2018-01-03 DIAGNOSIS — D649 Anemia, unspecified: Secondary | ICD-10-CM | POA: Diagnosis not present

## 2018-01-03 DIAGNOSIS — I120 Hypertensive chronic kidney disease with stage 5 chronic kidney disease or end stage renal disease: Secondary | ICD-10-CM | POA: Diagnosis present

## 2018-01-03 DIAGNOSIS — Z794 Long term (current) use of insulin: Secondary | ICD-10-CM

## 2018-01-03 DIAGNOSIS — R278 Other lack of coordination: Secondary | ICD-10-CM | POA: Diagnosis not present

## 2018-01-03 DIAGNOSIS — E785 Hyperlipidemia, unspecified: Secondary | ICD-10-CM | POA: Diagnosis present

## 2018-01-03 DIAGNOSIS — E1122 Type 2 diabetes mellitus with diabetic chronic kidney disease: Secondary | ICD-10-CM | POA: Diagnosis present

## 2018-01-03 DIAGNOSIS — E440 Moderate protein-calorie malnutrition: Secondary | ICD-10-CM | POA: Diagnosis present

## 2018-01-03 DIAGNOSIS — Z95820 Peripheral vascular angioplasty status with implants and grafts: Secondary | ICD-10-CM

## 2018-01-03 DIAGNOSIS — E538 Deficiency of other specified B group vitamins: Secondary | ICD-10-CM | POA: Diagnosis present

## 2018-01-03 DIAGNOSIS — Z841 Family history of disorders of kidney and ureter: Secondary | ICD-10-CM

## 2018-01-03 DIAGNOSIS — F17210 Nicotine dependence, cigarettes, uncomplicated: Secondary | ICD-10-CM | POA: Diagnosis present

## 2018-01-03 DIAGNOSIS — E876 Hypokalemia: Secondary | ICD-10-CM | POA: Diagnosis not present

## 2018-01-03 DIAGNOSIS — N189 Chronic kidney disease, unspecified: Secondary | ICD-10-CM

## 2018-01-03 DIAGNOSIS — N390 Urinary tract infection, site not specified: Secondary | ICD-10-CM | POA: Diagnosis present

## 2018-01-03 DIAGNOSIS — T39395A Adverse effect of other nonsteroidal anti-inflammatory drugs [NSAID], initial encounter: Secondary | ICD-10-CM | POA: Diagnosis present

## 2018-01-03 DIAGNOSIS — T8130XA Disruption of wound, unspecified, initial encounter: Secondary | ICD-10-CM | POA: Diagnosis present

## 2018-01-03 DIAGNOSIS — G9340 Encephalopathy, unspecified: Secondary | ICD-10-CM | POA: Diagnosis not present

## 2018-01-03 DIAGNOSIS — I129 Hypertensive chronic kidney disease with stage 1 through stage 4 chronic kidney disease, or unspecified chronic kidney disease: Secondary | ICD-10-CM | POA: Diagnosis not present

## 2018-01-03 DIAGNOSIS — N170 Acute kidney failure with tubular necrosis: Secondary | ICD-10-CM | POA: Diagnosis present

## 2018-01-03 DIAGNOSIS — M7989 Other specified soft tissue disorders: Secondary | ICD-10-CM | POA: Diagnosis not present

## 2018-01-03 DIAGNOSIS — R011 Cardiac murmur, unspecified: Secondary | ICD-10-CM | POA: Diagnosis not present

## 2018-01-03 DIAGNOSIS — I252 Old myocardial infarction: Secondary | ICD-10-CM

## 2018-01-03 DIAGNOSIS — Z89511 Acquired absence of right leg below knee: Secondary | ICD-10-CM | POA: Diagnosis not present

## 2018-01-03 DIAGNOSIS — T40605A Adverse effect of unspecified narcotics, initial encounter: Secondary | ICD-10-CM | POA: Diagnosis present

## 2018-01-03 DIAGNOSIS — Z978 Presence of other specified devices: Secondary | ICD-10-CM | POA: Diagnosis not present

## 2018-01-03 DIAGNOSIS — G92 Toxic encephalopathy: Secondary | ICD-10-CM | POA: Diagnosis present

## 2018-01-03 DIAGNOSIS — E872 Acidosis: Secondary | ICD-10-CM | POA: Diagnosis present

## 2018-01-03 DIAGNOSIS — L97909 Non-pressure chronic ulcer of unspecified part of unspecified lower leg with unspecified severity: Secondary | ICD-10-CM | POA: Diagnosis present

## 2018-01-03 DIAGNOSIS — M86172 Other acute osteomyelitis, left ankle and foot: Secondary | ICD-10-CM | POA: Diagnosis present

## 2018-01-03 DIAGNOSIS — R6520 Severe sepsis without septic shock: Secondary | ICD-10-CM | POA: Diagnosis present

## 2018-01-03 DIAGNOSIS — Z79899 Other long term (current) drug therapy: Secondary | ICD-10-CM | POA: Diagnosis not present

## 2018-01-03 DIAGNOSIS — Z89512 Acquired absence of left leg below knee: Secondary | ICD-10-CM | POA: Diagnosis not present

## 2018-01-03 DIAGNOSIS — I96 Gangrene, not elsewhere classified: Secondary | ICD-10-CM | POA: Diagnosis not present

## 2018-01-03 DIAGNOSIS — R34 Anuria and oliguria: Secondary | ICD-10-CM | POA: Diagnosis present

## 2018-01-03 DIAGNOSIS — T8781 Dehiscence of amputation stump: Secondary | ICD-10-CM | POA: Diagnosis not present

## 2018-01-03 DIAGNOSIS — Z955 Presence of coronary angioplasty implant and graft: Secondary | ICD-10-CM

## 2018-01-03 DIAGNOSIS — N19 Unspecified kidney failure: Secondary | ICD-10-CM

## 2018-01-03 DIAGNOSIS — I10 Essential (primary) hypertension: Secondary | ICD-10-CM | POA: Diagnosis not present

## 2018-01-03 DIAGNOSIS — E875 Hyperkalemia: Secondary | ICD-10-CM

## 2018-01-03 DIAGNOSIS — N183 Chronic kidney disease, stage 3 (moderate): Secondary | ICD-10-CM | POA: Diagnosis not present

## 2018-01-03 DIAGNOSIS — R509 Fever, unspecified: Secondary | ICD-10-CM | POA: Diagnosis not present

## 2018-01-03 DIAGNOSIS — Z8249 Family history of ischemic heart disease and other diseases of the circulatory system: Secondary | ICD-10-CM

## 2018-01-03 DIAGNOSIS — D62 Acute posthemorrhagic anemia: Secondary | ICD-10-CM | POA: Diagnosis not present

## 2018-01-03 DIAGNOSIS — N179 Acute kidney failure, unspecified: Secondary | ICD-10-CM

## 2018-01-03 DIAGNOSIS — Z89411 Acquired absence of right great toe: Secondary | ICD-10-CM | POA: Diagnosis not present

## 2018-01-03 DIAGNOSIS — R410 Disorientation, unspecified: Secondary | ICD-10-CM | POA: Diagnosis not present

## 2018-01-03 DIAGNOSIS — Z6833 Body mass index (BMI) 33.0-33.9, adult: Secondary | ICD-10-CM

## 2018-01-03 DIAGNOSIS — Z8639 Personal history of other endocrine, nutritional and metabolic disease: Secondary | ICD-10-CM | POA: Diagnosis not present

## 2018-01-03 LAB — COMPREHENSIVE METABOLIC PANEL
ALBUMIN: 2.2 g/dL — AB (ref 3.5–5.0)
ALT: 25 U/L (ref 0–44)
AST: 39 U/L (ref 15–41)
Alkaline Phosphatase: 195 U/L — ABNORMAL HIGH (ref 38–126)
Anion gap: 14 (ref 5–15)
BILIRUBIN TOTAL: 0.3 mg/dL (ref 0.3–1.2)
BUN: 56 mg/dL — AB (ref 8–23)
CO2: 22 mmol/L (ref 22–32)
CREATININE: 6.29 mg/dL — AB (ref 0.61–1.24)
Calcium: 9.3 mg/dL (ref 8.9–10.3)
Chloride: 98 mmol/L (ref 98–111)
GFR calc Af Amer: 10 mL/min — ABNORMAL LOW (ref >60)
GFR calc non Af Amer: 8 mL/min — ABNORMAL LOW (ref >60)
GLUCOSE: 154 mg/dL — AB (ref 70–99)
Potassium: 5.2 mmol/L — ABNORMAL HIGH (ref 3.5–5.1)
Sodium: 134 mmol/L — ABNORMAL LOW (ref 135–145)
TOTAL PROTEIN: 6.6 g/dL (ref 6.5–8.1)

## 2018-01-03 LAB — I-STAT CG4 LACTIC ACID, ED
Lactic Acid, Venous: 3.24 mmol/L (ref 0.5–1.9)
Lactic Acid, Venous: 1.54 mmol/L (ref 0.5–1.9)

## 2018-01-03 LAB — BASIC METABOLIC PANEL
ANION GAP: 14 (ref 5–15)
BUN: 56 mg/dL — AB (ref 8–23)
CHLORIDE: 100 mmol/L (ref 98–111)
CO2: 23 mmol/L (ref 22–32)
Calcium: 8.6 mg/dL — ABNORMAL LOW (ref 8.9–10.3)
Creatinine, Ser: 6.3 mg/dL — ABNORMAL HIGH (ref 0.61–1.24)
GFR calc Af Amer: 10 mL/min — ABNORMAL LOW (ref >60)
GFR, EST NON AFRICAN AMERICAN: 8 mL/min — AB (ref >60)
GLUCOSE: 124 mg/dL — AB (ref 70–99)
POTASSIUM: 5.1 mmol/L (ref 3.5–5.1)
Sodium: 137 mmol/L (ref 135–145)

## 2018-01-03 LAB — CBC WITH DIFFERENTIAL/PLATELET
BASOS ABS: 0 10*3/uL (ref 0.0–0.1)
Basophils Relative: 0 %
EOS ABS: 0.4 10*3/uL (ref 0.0–0.7)
Eosinophils Relative: 1 %
HEMATOCRIT: 21.9 % — AB (ref 39.0–52.0)
HEMOGLOBIN: 6.8 g/dL — AB (ref 13.0–17.0)
Lymphocytes Relative: 5 %
Lymphs Abs: 1.8 10*3/uL (ref 0.7–4.0)
MCH: 26.9 pg (ref 26.0–34.0)
MCHC: 31.1 g/dL (ref 30.0–36.0)
MCV: 86.6 fL (ref 78.0–100.0)
MONOS PCT: 6 %
Monocytes Absolute: 2.2 10*3/uL — ABNORMAL HIGH (ref 0.1–1.0)
NEUTROS ABS: 31.8 10*3/uL — AB (ref 1.7–7.7)
NEUTROS PCT: 88 %
Platelets: 452 10*3/uL — ABNORMAL HIGH (ref 150–400)
RBC: 2.53 MIL/uL — AB (ref 4.22–5.81)
RDW: 14.2 % (ref 11.5–15.5)
WBC: 36.2 10*3/uL — AB (ref 4.0–10.5)

## 2018-01-03 LAB — GLUCOSE, CAPILLARY
GLUCOSE-CAPILLARY: 115 mg/dL — AB (ref 70–99)
Glucose-Capillary: 129 mg/dL — ABNORMAL HIGH (ref 70–99)
Glucose-Capillary: 132 mg/dL — ABNORMAL HIGH (ref 70–99)

## 2018-01-03 LAB — PREPARE RBC (CROSSMATCH)

## 2018-01-03 LAB — IRON AND TIBC
IRON: 20 ug/dL — AB (ref 45–182)
Saturation Ratios: 11 % — ABNORMAL LOW (ref 17.9–39.5)
TIBC: 185 ug/dL — AB (ref 250–450)
UIBC: 165 ug/dL

## 2018-01-03 LAB — FOLATE: FOLATE: 6 ng/mL (ref >5.9)

## 2018-01-03 LAB — VITAMIN B12: Vitamin B-12: 96 pg/mL — ABNORMAL LOW (ref 180–914)

## 2018-01-03 LAB — RETICULOCYTES
RBC.: 2.6 MIL/uL — ABNORMAL LOW (ref 4.22–5.81)
Retic Count, Absolute: 18.2 10*3/uL — ABNORMAL LOW (ref 19.0–186.0)
Retic Ct Pct: 0.7 % (ref 0.4–3.1)

## 2018-01-03 LAB — MRSA PCR SCREENING: MRSA by PCR: NEGATIVE

## 2018-01-03 LAB — FERRITIN: FERRITIN: 695 ng/mL — AB (ref 24–336)

## 2018-01-03 LAB — ABO/RH: ABO/RH(D): O POS

## 2018-01-03 MED ORDER — SODIUM CHLORIDE 0.9 % IV BOLUS
1000.0000 mL | Freq: Once | INTRAVENOUS | Status: AC
  Administered 2018-01-03: 1000 mL via INTRAVENOUS

## 2018-01-03 MED ORDER — ATORVASTATIN CALCIUM 20 MG PO TABS: 20.0000 mg | ORAL_TABLET | Freq: Every day | ORAL | Status: DC

## 2018-01-03 MED ORDER — SODIUM CHLORIDE 0.9% IV SOLUTION
Freq: Once | INTRAVENOUS | Status: AC
  Administered 2018-01-03: 16:00:00 via INTRAVENOUS

## 2018-01-03 MED ORDER — ONDANSETRON HCL 4 MG PO TABS: 4.0000 mg | ORAL_TABLET | Freq: Four times a day (QID) | ORAL | Status: DC | PRN

## 2018-01-03 MED ORDER — ONDANSETRON HCL 4 MG/2ML IJ SOLN
4.0000 mg | Freq: Four times a day (QID) | INTRAMUSCULAR | Status: DC | PRN
  Administered 2018-01-04: 4 mg via INTRAVENOUS

## 2018-01-03 MED ORDER — CLOPIDOGREL BISULFATE 75 MG PO TABS
75.0000 mg | ORAL_TABLET | Freq: Every day | ORAL | Status: DC
  Administered 2018-01-05 – 2018-01-23 (×18): 75 mg via ORAL
  Filled 2018-01-03 (×19): qty 1

## 2018-01-03 MED ORDER — OXYCODONE-ACETAMINOPHEN 5-325 MG PO TABS
1.0000 | ORAL_TABLET | ORAL | Status: DC | PRN
  Administered 2018-01-03: 1 via ORAL
  Filled 2018-01-03: qty 1

## 2018-01-03 MED ORDER — VANCOMYCIN HCL IN DEXTROSE 1-5 GM/200ML-% IV SOLN: 1000.0000 mg | Freq: Two times a day (BID) | INTRAVENOUS | Status: DC

## 2018-01-03 MED ORDER — GABAPENTIN 300 MG PO CAPS
300.0000 mg | ORAL_CAPSULE | Freq: Two times a day (BID) | ORAL | Status: DC
Start: 2018-01-03 — End: 2018-01-04
  Administered 2018-01-03 (×2): 300 mg via ORAL
  Filled 2018-01-03 (×2): qty 1

## 2018-01-03 MED ORDER — SODIUM CHLORIDE 0.9 % IV SOLN
2.0000 g | INTRAVENOUS | Status: DC
  Administered 2018-01-03: 2 g via INTRAVENOUS
  Filled 2018-01-03: qty 2

## 2018-01-03 MED ORDER — OXYCODONE HCL 5 MG PO TABS
5.0000 mg | ORAL_TABLET | ORAL | Status: DC | PRN
  Administered 2018-01-03: 5 mg via ORAL
  Filled 2018-01-03: qty 1

## 2018-01-03 MED ORDER — ASPIRIN EC 81 MG PO TBEC
81.0000 mg | DELAYED_RELEASE_TABLET | Freq: Two times a day (BID) | ORAL | Status: DC
  Administered 2018-01-03 – 2018-01-23 (×36): 81 mg via ORAL
  Filled 2018-01-03 (×37): qty 1

## 2018-01-03 MED ORDER — SODIUM CHLORIDE 0.9 % IV SOLN
INTRAVENOUS | Status: AC
  Administered 2018-01-03: 19:00:00 via INTRAVENOUS

## 2018-01-03 MED ORDER — ACETAMINOPHEN 650 MG RE SUPP: 650.0000 mg | Freq: Four times a day (QID) | RECTAL | Status: DC | PRN

## 2018-01-03 MED ORDER — SODIUM CHLORIDE 0.9 % IV SOLN
1.0000 g | INTRAVENOUS | Status: DC
  Administered 2018-01-04 – 2018-01-05 (×2): 1 g via INTRAVENOUS
  Filled 2018-01-03 (×2): qty 1

## 2018-01-03 MED ORDER — METHOCARBAMOL 500 MG PO TABS: 500.0000 mg | ORAL_TABLET | Freq: Three times a day (TID) | ORAL | Status: DC | PRN

## 2018-01-03 MED ORDER — NICOTINE 21 MG/24HR TD PT24
21.0000 mg | MEDICATED_PATCH | Freq: Once | TRANSDERMAL | Status: AC
  Administered 2018-01-03: 21 mg via TRANSDERMAL
  Filled 2018-01-03: qty 1

## 2018-01-03 MED ORDER — SENNOSIDES-DOCUSATE SODIUM 8.6-50 MG PO TABS
1.0000 | ORAL_TABLET | Freq: Every evening | ORAL | Status: DC | PRN
  Administered 2018-01-19 – 2018-01-22 (×3): 1 via ORAL
  Filled 2018-01-03 (×3): qty 1

## 2018-01-03 MED ORDER — METRONIDAZOLE IN NACL 5-0.79 MG/ML-% IV SOLN
500.0000 mg | Freq: Three times a day (TID) | INTRAVENOUS | Status: AC
  Administered 2018-01-03 – 2018-01-06 (×10): 500 mg via INTRAVENOUS
  Filled 2018-01-03 (×10): qty 100

## 2018-01-03 MED ORDER — ACETAMINOPHEN 325 MG PO TABS: 650.0000 mg | ORAL_TABLET | Freq: Four times a day (QID) | ORAL | Status: DC | PRN

## 2018-01-03 MED ORDER — VANCOMYCIN HCL 10 G IV SOLR
2000.0000 mg | Freq: Once | INTRAVENOUS | Status: DC
  Administered 2018-01-03: 2000 mg via INTRAVENOUS
  Filled 2018-01-03: qty 2000

## 2018-01-03 MED ORDER — ROSUVASTATIN CALCIUM 20 MG PO TABS
20.0000 mg | ORAL_TABLET | Freq: Every evening | ORAL | Status: DC
  Administered 2018-01-03 – 2018-01-23 (×18): 20 mg via ORAL
  Filled 2018-01-03 (×18): qty 1

## 2018-01-03 MED ORDER — OXYCODONE HCL 5 MG PO TABS
5.0000 mg | ORAL_TABLET | Freq: Once | ORAL | Status: AC
  Administered 2018-01-03: 5 mg via ORAL
  Filled 2018-01-03: qty 1

## 2018-01-03 MED ORDER — VITAMIN B-12 1000 MCG PO TABS
1000.0000 ug | ORAL_TABLET | Freq: Every day | ORAL | Status: DC
  Administered 2018-01-03 – 2018-01-23 (×19): 1000 ug via ORAL
  Filled 2018-01-03 (×22): qty 1

## 2018-01-03 MED ORDER — INSULIN ASPART 100 UNIT/ML ~~LOC~~ SOLN
0.0000 [IU] | Freq: Three times a day (TID) | SUBCUTANEOUS | Status: DC
  Administered 2018-01-03 – 2018-01-12 (×9): 1 [IU] via SUBCUTANEOUS
  Administered 2018-01-13: 3 [IU] via SUBCUTANEOUS
  Administered 2018-01-14: 1 [IU] via SUBCUTANEOUS
  Administered 2018-01-14: 3 [IU] via SUBCUTANEOUS
  Administered 2018-01-15 – 2018-01-21 (×5): 1 [IU] via SUBCUTANEOUS
  Administered 2018-01-22: 2 [IU] via SUBCUTANEOUS
  Administered 2018-01-23: 1 [IU] via SUBCUTANEOUS

## 2018-01-03 MED ORDER — INSULIN ASPART 100 UNIT/ML ~~LOC~~ SOLN: 0.0000 [IU] | Freq: Every day | SUBCUTANEOUS | Status: DC

## 2018-01-03 MED ORDER — OXYCODONE-ACETAMINOPHEN 10-325 MG PO TABS
1.0000 | ORAL_TABLET | ORAL | Status: DC | PRN
Start: 2018-01-03 — End: 2018-01-03

## 2018-01-03 MED ORDER — SODIUM CHLORIDE 0.9 % IV SOLN: 2.0000 g | Freq: Three times a day (TID) | INTRAVENOUS | Status: DC

## 2018-01-03 NOTE — ED Notes (Signed)
RN to bedside to obtain consent for blood transfusion. Pt states he "aint signing nothing right now." Patient unwilling to verbalize whether MD discussed need for transfusion with him. States he does not want to sign consent. States he would like some food.

## 2018-01-03 NOTE — Progress Notes (Addendum)
Pharmacy Antibiotic Note  Joel Hebert is a 62 y.o. male admitted on 01/03/2018 with sepsis.  Pharmacy has been consulted for vancomycin and cefepime dosing.  Lactic acid elevated at 3.24. Baseline SC1.05, but currently drastically elevated at 6.29  Plan: Vancomycin 2g IV x1 > will need to check a vancomycin random level in ~48h before redosing to determine clearnace Cefepime 2g IV q24h Follow c/s, clinical progression, renal function, level PRN, LOT      Temp (24hrs), Avg:99.1 F (37.3 C), Min:98.4 F (36.9 C), Max:99.8 F (37.7 C)  Recent Labs  Lab 01/03/18 1056 01/03/18 1101  WBC 36.2*  --   LATICACIDVEN  --  3.24*    Estimated Creatinine Clearance: 78.7 mL/min (by C-G formula based on SCr of 1.05 mg/dL).    No Known Allergies  Antimicrobials this admission: Vancomycin 6/27 >>  Cefepime 6/27 >>   Dose adjustments this admission: n/a  Microbiology results: 6/27 BCx:    Thank you for allowing pharmacy to be a part of this patient's care.  Azyiah Bo D. Lilyanne Mcquown, PharmD, BCPS Clinical Pharmacist [phone] Please check AMION for all [facility] numbers 01/03/2018 11:37 AM

## 2018-01-03 NOTE — ED Provider Notes (Signed)
[facility] EMERGENCY DEPARTMENT Provider Note   CSN: [id number] Arrival date & time: 01/03/18  1914     History   Chief Complaint Chief Complaint  Patient presents with  . Fever    HPI Joel Hebert is a 62 y.o. male with a h/o of PAD, HTN, CAD, HLD, and gangrene of the left foot, left trans-metatarsal amputation who presents to the emergency department by EMS from nursing home chief complaint of fever.  EMS reports that the patient had an oral temp of 100.2 at the nursing home and was given 1000 mg of Tylenol prior to arrival.  EMS also documented an SaO2 of 88% on room air and mild tachycardia in the 100s.  He is scheduled to have a left AKA tomorrow performed by Dr. Lajoyce Corners.  He reports the nursing home is his dressing daily.  He reports constant, gradually worsening pain and swelling to the left lower extremity.  He reports the swelling is now going up his left leg.  His last dose of pain medication was his home medication this morning.   Denies nausea, vomiting, diarrhea, chest pain, chills, rash, dysuria, or headache.   Last appointment with Dr. Lajoyce Corners was 12/27/2017 where he was noted to have progressive ischemic changes to the transmetatarsal amputation and ischemic gangrenous changes of the dorsal flap with wound dehiscence.     Past Medical History:  Diagnosis Date  . Coronary artery disease   . High cholesterol   . Hypertension   . MI (myocardial infarction) (HCC) 08/13/2014; 01/2016- 03/2016 X 3  . PAD (peripheral artery disease) (HCC)   . Peripheral vascular disease (HCC)   . Type II diabetes mellitus [facility])     Patient Active Problem List   Diagnosis Date Noted  . Sepsis (HCC) 01/03/2018  . Dehiscence of amputation stump (HCC) 12/27/2017  . History of transmetatarsal amputation of left foot (HCC) 12/14/2017  . Gangrene of left foot (HCC)   . Atherosclerosis of native artery of left lower extremity with gangrene (HCC) 11/19/2017  . Peripheral  artery disease (HCC) 11/08/2017  . PAD (peripheral artery disease) (HCC) 08/14/2017    Past Surgical History:  Procedure Laterality Date  . ABDOMINAL AORTOGRAM W/LOWER EXTREMITY  08/14/2017  . ABDOMINAL AORTOGRAM W/LOWER EXTREMITY N/A 08/14/2017   Procedure: ABDOMINAL AORTOGRAM W/LOWER EXTREMITY;  Surgeon: Nada Libman, MD;  Location: [facility] INVASIVE CV LAB;  Service: Cardiovascular;  Laterality: N/A;  . ABDOMINAL AORTOGRAM W/LOWER EXTREMITY N/A 09/05/2017   Procedure: ABDOMINAL AORTOGRAM W/LOWER EXTREMITY;  Surgeon: Maeola Harman, MD;  Location: [facility] INVASIVE CV LAB;  Service: Cardiovascular;  Laterality: N/A;  . AMPUTATION Left 10/09/2017   Procedure: AMPUTATION DIGIT LEFT FIFTH TOE;  Surgeon: Maeola Harman, MD;  Location: [facility] OR;  Service: Vascular;  Laterality: Left;  . AMPUTATION Left 12/14/2017   Procedure: LEFT TRANSMETATARSAL AMPUTATION;  Surgeon: Nadara Mustard, MD;  Location: [facility] OR;  Service: Orthopedics;  Laterality: Left;  . AMPUTATION TOE Left 10/09/2017   left foot 5th toe  . CORONARY ANGIOPLASTY WITH STENT PLACEMENT  01/2016; 03/2016   "1 + 1" (08/14/2017)  . LOWER EXTREMITY ANGIOGRAPHY N/A 08/20/2017   Procedure: LOWER EXTREMITY ANGIOGRAPHY-rt leg;  Surgeon: Sherren Kerns, MD;  Location: [facility] INVASIVE CV LAB;  Service: Cardiovascular;  Laterality: N/A;  . PERIPHERAL VASCULAR ATHERECTOMY  08/14/2017   Procedure: PERIPHERAL VASCULAR ATHERECTOMY;  Surgeon: Nada Libman, MD;  Location: [facility] INVASIVE CV LAB;  Service: Cardiovascular;;  SFA   .  PERIPHERAL VASCULAR ATHERECTOMY Left 09/05/2017   Procedure: PERIPHERAL VASCULAR ATHERECTOMY;  Surgeon: Maeola Harmanain, Brandon Christopher, MD;  [facility];  Service: Cardiovascular;  Laterality: Left;  Tibioperoneal and posterior tibial  . PERIPHERAL VASCULAR BALLOON ANGIOPLASTY  08/14/2017   Procedure: PERIPHERAL VASCULAR BALLOON ANGIOPLASTY;  Surgeon: Nada LibmanBrabham, Vance W, MD;  Location: [facility] INVASIVE CV LAB;  Service:  Cardiovascular;;  peroneal  . PERIPHERAL VASCULAR BALLOON ANGIOPLASTY  08/20/2017   Procedure: PERIPHERAL VASCULAR BALLOON ANGIOPLASTY;  Surgeon: Sherren KernsFields, Charles E, MD;  Location: [facility] INVASIVE CV LAB;  Service: Cardiovascular;;  . PERIPHERAL VASCULAR INTERVENTION  08/14/2017   Procedure: PERIPHERAL VASCULAR INTERVENTION;  Surgeon: Nada LibmanBrabham, Vance W, MD;  Location: [facility] INVASIVE CV LAB;  Service: Cardiovascular;;  SFA stent  . TONSILLECTOMY    . WOUND DEBRIDEMENT Left 11/06/2017   Procedure: DEBRIDEMENT WOUND TOE BED OF FIFTH TOE LEFT FOOT;  Surgeon: Maeola Harmanain, Brandon Christopher, MD;  [facility];  Service: Vascular;  Laterality: Left;        Home Medications    Prior to Admission medications   Medication Sig Start Date End Date Taking? Authorizing Provider  acetaminophen (TYLENOL) 500 MG tablet Take 500 mg by mouth every 6 (six) hours as needed for mild pain or headache.    Yes [provider]  aspirin EC 81 MG tablet Take 81 mg by mouth 2 (two) times daily.    Yes [provider]  atorvastatin (LIPITOR) 20 MG tablet Take 20 mg by mouth daily.   Yes [provider]  clopidogrel (PLAVIX) 75 MG tablet Take 1 tablet (75 mg total) by mouth daily with breakfast. 08/15/17  Yes Rhyne, Samantha J, PA-C  diclofenac (VOLTAREN) 50 MG EC tablet Take 1 tablet (50 mg total) by mouth 2 (two) times daily. 07/08/17  Yes Donnetta Hutchingook, Brian, MD  gabapentin (NEURONTIN) 300 MG capsule Take 1 capsule (300 mg total) by mouth 2 (two) times daily. 09/28/17  Yes Nickel, Carma LairSuzanne L, NP  insulin aspart (NOVOLOG) 100 UNIT/ML injection Inject 0-15 Units into the skin 3 (three) times daily with meals. 12/18/17  Yes Nadara Mustarduda, Marcus V, MD  lisinopril-hydrochlorothiazide (PRINZIDE,ZESTORETIC) 20-25 MG tablet Take 1 tablet by mouth daily.  07/24/17  Yes [provider]  metFORMIN (GLUCOPHAGE) 850 MG tablet Take 850 mg by mouth 2 (two) times daily with a meal.   Yes [provider]  methocarbamol  (ROBAXIN) 500 MG tablet Take 1 tablet (500 mg total) by mouth every 8 (eight) hours as needed for muscle spasms. 12/18/17  Yes Nadara Mustarduda, Marcus V, MD  metoprolol tartrate (LOPRESSOR) 100 MG tablet Take 100 mg by mouth 2 (two) times daily.   Yes [provider]  nitroGLYCERIN (NITRODUR - DOSED IN MG/24 HR) 0.2 mg/hr patch Place 1 patch (0.2 mg total) onto the skin daily. 11/19/17  Yes Nadara Mustarduda, Marcus V, MD  nitroGLYCERIN (NITROSTAT) 0.4 MG SL tablet Place 0.4 mg under the tongue every 5 (five) minutes as needed for chest pain.   Yes [provider]  oxyCODONE-acetaminophen (PERCOCET) 10-325 MG tablet Take 1 tablet by mouth every 4 (four) hours as needed for pain. 12/18/17  Yes Nadara Mustarduda, Marcus V, MD  pentoxifylline (TRENTAL) 400 MG CR tablet Take 1 tablet (400 mg total) by mouth 3 (three) times daily with meals. 11/19/17  Yes Nadara Mustarduda, Marcus V, MD  rosuvastatin (CRESTOR) 20 MG tablet Take 20 mg by mouth every evening.    Yes [provider]  methocarbamol (ROBAXIN) 500 MG tablet Take 1 tablet (500 mg  total) by mouth every 8 (eight) hours as needed for muscle spasms. Patient not taking: Reported on 01/03/2018 12/18/17   Nadara Mustard, MD  oxyCODONE-acetaminophen (PERCOCET) 10-325 MG tablet Take 1 tablet by mouth every 8 (eight) hours as needed for pain. Patient not taking: Reported on 01/03/2018 12/06/17   Nadara Mustard, MD    Family History Family History  Problem Relation Age of Onset  . Heart failure Brother   . Heart disease Brother   . Kidney disease Mother   . Heart disease Father     Social History Social History   Tobacco Use  . Smoking status: Current Some Day Smoker    Packs/day: 0.25    Years: 43.00    Pack years: 10.75    Types: Cigarettes    Start date: 08/12/1974  . Smokeless tobacco: Never Used  . Tobacco comment: 2-3 cigarettes occasionally  Substance Use Topics  . Alcohol use: Not Currently    Comment: 08/14/2017 "nothing since ~ 2012"  . Drug use: No      Allergies   Patient has no known allergies.   Review of Systems Review of Systems  Constitutional: Positive for fever. Negative for appetite change.  HENT: Negative for congestion and sore throat.   Eyes: Negative for visual disturbance.  Respiratory: Positive for shortness of breath.   Cardiovascular: Negative for chest pain.  Gastrointestinal: Negative for abdominal pain, diarrhea, nausea and vomiting.  Genitourinary: Negative for dysuria, frequency and urgency.  Musculoskeletal: Negative for back pain.  Skin: Positive for color change and wound. Negative for rash.  Allergic/Immunologic: Negative for immunocompromised state.  Neurological: Negative for weakness, numbness and headaches.  Psychiatric/Behavioral: Negative for confusion.   Physical Exam Updated Vital Signs BP (!) 147/51 (BP Location: Right Wrist) Comment: rn present  Pulse 93   Temp 98.2 F (36.8 C) (Oral)   Resp 16   Ht 5\' 8"  (1.727 m)   Wt 93.7 kg (206 lb 9.1 oz)   SpO2 90%   BMI 31.41 kg/m   Physical Exam  Constitutional: He appears well-developed.  HENT:  Head: Normocephalic.  Eyes: Conjunctivae are normal.  Neck: Neck supple.  Cardiovascular: Normal rate, regular rhythm, normal heart sounds and intact distal pulses. Exam reveals no gallop and no friction rub.  No murmur heard. Pulmonary/Chest: Effort normal and breath sounds normal. No stridor. No respiratory distress. He has no wheezes. He has no rales. He exhibits no tenderness.  Abdominal: Soft. Bowel sounds are normal. He exhibits no distension and no mass. There is no tenderness. There is no rebound and no guarding. No hernia.  Musculoskeletal: He exhibits tenderness. He exhibits no edema or deformity.  Neurological: He is alert.  Skin: Skin is warm and dry.  Gangrenous tissue  and wound dehiscence noted to the incision site of the left transmetatarsal amputation.  Edema and mild warmth extends up the left lower extremity to the knee.   Minimal erythema noted to the left lower leg.  Purulent drainage is also noted which is malodorous.,   Psychiatric: His behavior is normal.  Nursing note and vitals reviewed.  ED Treatments / Results  Labs (all labs ordered are listed, but only abnormal results are displayed) Labs Reviewed  CBC WITH DIFFERENTIAL/PLATELET - Abnormal; Notable for the following components:      Result Value   WBC 36.2 (*)    RBC 2.53 (*)    Hemoglobin 6.8 (*)    HCT 21.9 (*)    Platelets 452 (*)  Neutro Abs 31.8 (*)    Monocytes Absolute 2.2 (*)    All other components within normal limits  COMPREHENSIVE METABOLIC PANEL - Abnormal; Notable for the following components:   Sodium 134 (*)    Potassium 5.2 (*)    Glucose, Bld 154 (*)    BUN 56 (*)    Creatinine, Ser 6.29 (*)    Albumin 2.2 (*)    Alkaline Phosphatase 195 (*)    GFR calc non Af Amer 8 (*)    GFR calc Af Amer 10 (*)    All other components within normal limits  VITAMIN B12 - Abnormal; Notable for the following components:   Vitamin B-12 96 (*)    All other components within normal limits  IRON AND TIBC - Abnormal; Notable for the following components:   Iron 20 (*)    TIBC 185 (*)    Saturation Ratios 11 (*)    All other components within normal limits  FERRITIN - Abnormal; Notable for the following components:   Ferritin 695 (*)    All other components within normal limits  RETICULOCYTES - Abnormal; Notable for the following components:   RBC. 2.60 (*)    Retic Count, Absolute 18.2 (*)    All other components within normal limits  I-STAT CG4 LACTIC ACID, ED - Abnormal; Notable for the following components:   Lactic Acid, Venous 3.24 (*)    All other components within normal limits  CULTURE, BLOOD (ROUTINE X 2)  CULTURE, BLOOD (ROUTINE X 2)  FOLATE  URINALYSIS, ROUTINE W REFLEX MICROSCOPIC  I-STAT CG4 LACTIC ACID, ED  TYPE AND SCREEN  PREPARE RBC (CROSSMATCH)  ABO/RH    EKG None  Radiology Dg Chest 2  View  Result Date: 01/03/2018 CLINICAL DATA:  Shortness of breath. EXAM: CHEST - 2 VIEW COMPARISON:  No prior. FINDINGS: Mediastinum and hilar structures normal. Nodular opacity noted over the left upper lung. Repeat PA and lateral chest x-ray suggested. If nodular density remains chest CT can be obtained for further evaluation. Low lung volumes with mild basilar atelectasis. Cardiomegaly with normal pulmonary vascularity. No acute bony abnormality. IMPRESSION: 1. Questionable small nodular density left upper lung. Repeat PA lateral chest x-ray suggested. Nodular density remains chest CT can be obtained to further evaluate. 2.  Mild bibasilar subsegmental atelectasis. 3.  Cardiomegaly.  No pulmonary venous congestion. Electronically Signed   By: Maisie Fus  Register   On: 01/03/2018 11:41    Procedures .Critical Care Performed by: Barkley Boards, PA-C Authorized by: Barkley Boards, PA-C   Critical care provider statement:    Critical care time (minutes):  45   Critical care was necessary to treat or prevent imminent or life-threatening deterioration of the following conditions:  Sepsis, circulatory failure and renal failure   Critical care was time spent personally by me on the following activities:  Ordering and review of laboratory studies, obtaining history from patient or surrogate, examination of patient, evaluation of patient's response to treatment, re-evaluation of patient's condition, ordering and review of radiographic studies and review of old charts   (including critical care time)  Medications Ordered in ED Medications  nicotine (NICODERM CQ - dosed in mg/24 hours) patch 21 mg (21 mg Transdermal Patch Applied 01/03/18 1228)  ceFEPIme (MAXIPIME) 2 g in sodium chloride 0.9 % 100 mL IVPB (2 g Intravenous New Bag/Given 01/03/18 1416)  metroNIDAZOLE (FLAGYL) IVPB 500 mg (has no administration in time range)  aspirin EC tablet 81 mg (has no administration in  time range)  clopidogrel  (PLAVIX) tablet 75 mg (has no administration in time range)  gabapentin (NEURONTIN) capsule 300 mg (has no administration in time range)  methocarbamol (ROBAXIN) tablet 500 mg (has no administration in time range)  rosuvastatin (CRESTOR) tablet 20 mg (has no administration in time range)  acetaminophen (TYLENOL) tablet 650 mg (has no administration in time range)    Or  acetaminophen (TYLENOL) suppository 650 mg (has no administration in time range)  0.9 %  sodium chloride infusion (has no administration in time range)  senna-docusate (Senokot-S) tablet 1 tablet (has no administration in time range)  ondansetron (ZOFRAN) tablet 4 mg (has no administration in time range)    Or  ondansetron (ZOFRAN) injection 4 mg (has no administration in time range)  insulin aspart (novoLOG) injection 0-9 Units (has no administration in time range)  insulin aspart (novoLOG) injection 0-5 Units (has no administration in time range)  vitamin B-12 (CYANOCOBALAMIN) tablet 1,000 mcg (has no administration in time range)  oxyCODONE-acetaminophen (PERCOCET/ROXICET) 5-325 MG per tablet 1 tablet (has no administration in time range)    And  oxyCODONE (Oxy IR/ROXICODONE) immediate release tablet 5 mg (has no administration in time range)  oxyCODONE (Oxy IR/ROXICODONE) immediate release tablet 5 mg (5 mg Oral Given 01/03/18 1228)  sodium chloride 0.9 % bolus 1,000 mL (0 mLs Intravenous Stopped 01/03/18 1417)  0.9 %  sodium chloride infusion (Manually program via Guardrails IV Fluids) ( Intravenous Given 01/03/18 1544)  sodium chloride 0.9 % bolus 1,000 mL (1,000 mLs Intravenous New Bag/Given 01/03/18 1345)     Initial Impression / Assessment and Plan / ED Course  I have reviewed the triage vital signs and the nursing notes.  Pertinent labs & imaging results that were available during my care of the patient were reviewed by me and considered in my medical decision making (see chart for details).     62 year old male  with a h/o of PAD, HTN, CAD, HLD, and gangrene of the left foot, left trans-metatarsal amputation who presents to the emergency department by EMS from nursing home chief complaint of fever.  Febrile to 100.2 orally prior to arrival.  He was treated with the thousand milligrams of Tylenol at SNF.  Afebrile on arrival.  Labs are notable for leukocytosis of 36, lactate 3.24.  Code sepsis initiated with vancomycin and cefepime dosed by pharmacy.  Oxycodone given for pain control.   Hemoglobin 6.8.  Patient has no history of previous blood transfusions.  Type and screen ordered.  2 units of packed RBCs have been initiated.  Anemia panel is pending.  He denies hematuria, dysuria, melena, hemoptysis, hematemesis.  He does report that there has been some bleeding from the left lower extremity wound over the last few weeks that has been noted on dressing changes.  Suspect most likely source is new acute renal failure.  Creatinine is noted to be 6.29, most recently 1.05 on 12/14/2017.  Patient was started on [place]al on 11/19/17, which could serve as the nidus for the patient's acute renal failure.  EMS noted the patient to be satting at 88% on room air.  On exam, the patient had good waveform on the monitor with SaO2 of 92 to 94%.  Chest x-ray with pulmonary nodule, but no signs of vascular congestion or pneumonia.  Suspect source is the patient's left lower extremity.   The patient was seen and evaluated along with Dr. Clarice Pole, attending physician.  Spoke with Rayfield Citizen, with the family medicine practice who will  accept the patient for admission. The patient appears reasonably stabilized for admission considering the current resources, flow, and capabilities available in the ED at this time, and I doubt any other [facility] requiring further screening and/or treatment in the ED prior to admission.  Final Clinical Impressions(s) / ED Diagnoses   Final diagnoses:  Sepsis, due to unspecified organism (HCC)  Anemia,  unspecified type  Acute renal failure, unspecified acute renal failure type [facility])  Acute renal failure (ARF) [facility])    ED Discharge Orders    None       Barkley Boards, PA-C 01/03/18 1548    Arby Barrette, MD 01/10/18 1513

## 2018-01-03 NOTE — H&P (Addendum)
Date: 01/03/2018               Patient Name:  Joel Hebert MRN: [id number]  DOB: [birth year] Age / Sex: 62 y.o., male   PCP: Patient, No Pcp Per         Medical Service: Internal Medicine Teaching Service         Attending Physician: Dr. Oswaldo Done, Marquita Palms, *    First Contact: Dr. Midge Aver  Pager: [phone]  Second Contact: Dr. Antony Contras  Pager: [phone]       After Hours (After 5p/  First Contact Pager: [phone]  weekends / holidays): Second Contact Pager: [phone]   Chief Complaint: Fever  History of Present Illness: Patient is a 62 yo M with a pmhx significant for HTN, HLD, Type II DM, CAD, PAD, and recent transmetatarsal amputation of the left foot presenting today from his living facitliy with fever of 100.42F. He was recently admitted 12/14/2017 with left food gangrene and underwent transmetatarsal amputation by Dr. Lajoyce Corners the same day. He was discharged on 6/11 to [facility]. He followed up with Dr. Lajoyce Corners on 12/27/2017 two weeks post operatively and was noted to have progressive wound dehiscence and dry gangrenous changes. Dr. Lajoyce Corners was planning for left AKA that was scheduled for tomorrow 6/28. Patient reports he is unsure why his living facility sent him to the ER, says he might have been acting "slow". He is alert and oriented x 3 but slow to response to questions. He says his foot has been "giving me trouble". On ROS, patient denies fevers, chills, decreased UOP, or decreased po intake. He denies abdominal pain, N/V/D, and constipation. He was given 1,000 mg of tylenol prior to transportation.   On arrival to the ED, patient was afebrile T99.84F and hemodynamically stable BP 110/50, HR 95, RR 15, and oxygen 93% on RA. Lab were significant for CBC with leukocytosis wbc 36.2 and hemoglobin 6.8 from his baseline of 9-10. MCV was 86. B12 was checked and low at 96. Folate normal at 6.0, iron low at 20, TIBC low 185, and ferritin elevated 695.BMP was significant for new  acute renal failure, SCr of 6.29 from 1.05 two weeks ago, and potassium 5.2. Lactic acid was elevated at 3.24. Patient was given 1L NS bolus, IV vancomycin, IV cefepime, and transfused 2 units of pRBC.   Meds:  Current Meds  Medication Sig  . acetaminophen (TYLENOL) 500 MG tablet Take 500 mg by mouth every 6 (six) hours as needed for mild pain or headache.   Marland Kitchen aspirin EC 81 MG tablet Take 81 mg by mouth 2 (two) times daily.   Marland Kitchen atorvastatin (LIPITOR) 20 MG tablet Take 20 mg by mouth daily.  . clopidogrel (PLAVIX) 75 MG tablet Take 1 tablet (75 mg total) by mouth daily with breakfast.  . diclofenac (VOLTAREN) 50 MG EC tablet Take 1 tablet (50 mg total) by mouth 2 (two) times daily.  Marland Kitchen gabapentin (NEURONTIN) 300 MG capsule Take 1 capsule (300 mg total) by mouth 2 (two) times daily.  . insulin aspart (NOVOLOG) 100 UNIT/ML injection Inject 0-15 Units into the skin 3 (three) times daily with meals.  Marland Kitchen lisinopril-hydrochlorothiazide (PRINZIDE,ZESTORETIC) 20-25 MG tablet Take 1 tablet by mouth daily.   . metFORMIN (GLUCOPHAGE) 850 MG tablet Take 850 mg by mouth 2 (two) times daily with a meal.  . methocarbamol (ROBAXIN) 500 MG tablet Take 1 tablet (500 mg total) by mouth every 8 (eight) hours as needed for  muscle spasms.  . metoprolol tartrate (LOPRESSOR) 100 MG tablet Take 100 mg by mouth 2 (two) times daily.  . nitroGLYCERIN (NITRODUR - DOSED IN MG/24 HR) 0.2 mg/hr patch Place 1 patch (0.2 mg total) onto the skin daily.  . nitroGLYCERIN (NITROSTAT) 0.4 MG SL tablet Place 0.4 mg under the tongue every 5 (five) minutes as needed for chest pain.  Marland Kitchen oxyCODONE-acetaminophen (PERCOCET) 10-325 MG tablet Take 1 tablet by mouth every 4 (four) hours as needed for pain.  . pentoxifylline (TRENTAL) 400 MG CR tablet Take 1 tablet (400 mg total) by mouth 3 (three) times daily with meals.  . rosuvastatin (CRESTOR) 20 MG tablet Take 20 mg by mouth every evening.      Allergies: Allergies as of 01/03/2018  . (No  Known Allergies)   Past Medical History:  Diagnosis Date  . Coronary artery disease   . High cholesterol   . Hypertension   . MI (myocardial infarction) (HCC) 08/13/2014; 01/2016- 03/2016 X 3  . PAD (peripheral artery disease) (HCC)   . Peripheral vascular disease (HCC)   . Type II diabetes mellitus (HCC)     Family History:  Family History  Problem Relation Age of Onset  . Heart failure Brother   . Heart disease Brother   . Kidney disease Mother   . Heart disease Father    Social History: Smokes 1/2PPD x 40 years. Denies alcohol and illicit drug use.   Review of Systems: A complete ROS was negative except as per HPI.   Physical Exam: Blood pressure (!) 115/53, pulse 86, temperature 99.8 F (37.7 C), temperature source Rectal, resp. rate 18, SpO2 94 %. Constitutional: NAD, appears comfortable HEENT: Atraumatic, normocephalic. PERRL, anicteric sclera.  Neck: Supple, trachea midline.  Cardiovascular: RRR, no murmurs, rubs, or gallops.  Pulmonary/Chest: CTAB, no wheezes, rales, or rhonchi.  Abdominal: Soft, non tender, non distended. +BS.  Extremities: S/p left transmetatarsal amputation, with wound dehiscence and overlying necrotic / gangrenous tissue. Foul smelling odor with some mild purulent discharge Neurological: A&Ox3, CN II - XII grossly intact.  Skin: No rashes or erythema  Psychiatric: Normal mood, blunted affect      EKG: personally reviewed my interpretation is: NSR, peaked T waves in lateral leads  CXR: personally reviewed my interpretation is: Normal chest xray, ? Left upper lobe nodule   Assessment & Plan by Problem:  Patient is a 62 yo M with a pmhx of HTN, HLD, Type II DM, CAD, PAD, and recent transmetatarsal amputation of the left foot who was sent from SNF today with temperature of 100.31F. On exam patient has gangrenous changes with dehiscence of his transmetatarsal wound with purulent discharge. Labs with significant leukocytosis, acute renal failure,  and acute on chronic anemia.   Wet Gangrene of Left Transmetatarsal Stump: In the setting of type II DM and PVD s/p revascularization of both lower extremities (s/p left SFA stenting and right tibioperoneal trunk balloon angioplasty on 08/20/2017 by Dr. Darrick Penna; s/p atherectomy with drug-coated balloon angioplasty of left superficial femoral artery, stent of left superficial femoral artery, angioplasty of left popliteal artery, and failed angioplasty of left posterior tibial artery on 08-14-17 by Dr. Myra Gianotti). Patient originally underwent left toe amputation on 10/09/17 by Dr. Randie Heinz after progression of wet and dry gangrene despite revascularization. Unfortunately this was complicated wound breakdown and necrosis, and underwent transmetatarsal amputation per Dr. Lajoyce Corners on 12/14/17. Again, patient has had difficulty healing with complications due to his severe PVD. He is presenting today with wound dehiscence  and wet gangrene of his transmetatarsal amputation. S/p IV vanc and cefepime in ED.  -- Stop Vancomycin; Consider dapto - defer to ID  -- Continue IV cefepime -- Added IV metronidazole  -- ID consult for abx guidance given severe renal failure  -- Blood cx pending  -- Dr. Lajoyce Cornersuda consulted per EDP; appreciate recommendations  -- Follow fever & wbc curve  -- Trend lactic acid -- IVFs -- Percocet PRN for pain   Acute Renal Failure: Unclear etiology, patient denies recent medication changes. Possibly due to sepsis with underlying infection. Will touch base with SNF regarding medications, recent NSAID use, ect. He denies a decrease in UOP or difficulty urinating.  -- Renal U/S to rule out obstruction  -- UA pending  -- S/p 1L NS bolus in ED -- Continue fluid challenge, NS @ 150 cc x 12 hours -- Strict I&Os -- Monitor volume status, labs  -- Consider nephro consult tomorrow if no improvement with IVFs  -- Avoid nephrotoxins (stopped vancomycin) -- Holding lisinopril-HCTZ  Acute on Chronic Anemia:  Reportedly some bloody drainage from wound dehiscence?. Patient denies melena / hematochezia. Possibly exacerbated by his acute renal failure. Labs are significant for vitamin B12 deficiency. Iron studies are unreliable given his acute infection (consistent with acute phase response).  -- Start Vit B12 supplements -- Transfuse pRBC -- Follow up repeat CBC  HTN: BP is low / normal. Getting IVFs. -- Hold metop for now -- Holding lisinopril-HCTZ given AKI   Type II DM: Last A1c on 12/14/2017 was 7.2 -- Hold metformin given lactic acidosis and infection -- SSI-sensitive TID AC + QHS -- CBG checks QID   PVD: -- Continue ASA & Plavix  -- Holding pentoxifylline in the setting of renal failure   FEN: NS @ 150 cc/hr x 12 hours, replete lytes prn, HH / carb mod diet VTE ppx: SCDs Code Status: FULL    Dispo: Admit patient to Inpatient with expected length of stay greater than 2 midnights.  SignedReymundo Poll: Eddrick Dilone, MD 01/03/2018, 2:21 PM  Pager: [phone]

## 2018-01-03 NOTE — ED Triage Notes (Signed)
Pt here from Nursing home with c/o fever , pt is due to have a  Left  aka tomorrow, pt has had surgery on same leg to remove toes ,  Odor coming from that leg , pt had 1000mg  from EMS

## 2018-01-03 NOTE — ED Notes (Signed)
CRITICAL VALUE ALERT  Critical Value: I-stat Lactic Acid 3.24   Date & Time Notied:  01/03/18 11:05   Provider Notified: Pedro EarlsMia, PA  + [place] G, RN   Orders Received/Actions taken: n/a

## 2018-01-03 NOTE — Progress Notes (Signed)
Joel Hebert [id number] Admission Data: 01/03/2018 5:04 PM Attending Provider: Tyson AliasVincent, [name], *  YNW:[id number]:Patient, No Pcp Per Consults/ Treatment Team: Treatment Team:  Nadara Mustarduda, Marcus V, MD  Joel Hebert is a 62 y.o. male patient admitted from ED awake, alert  & orientated  X 3,  Full Code, VSS - Blood pressure (!) 147/51, pulse 93, temperature 98.2 F (36.8 C), temperature source Oral, resp. rate 16, height 5\' 8"  (1.727 m), weight 93.7 kg (206 lb 9.1 oz), SpO2 90 %., O2    no c/o shortness of breath, no c/o chest pain, no distress noted.  Allergies:  No Known Allergies   Past Medical History:  Diagnosis Date  . Coronary artery disease   . High cholesterol   . Hypertension   . MI (myocardial infarction) (HCC) 08/13/2014; 01/2016- 03/2016 X 3  . PAD (peripheral artery disease) (HCC)   . Peripheral vascular disease (HCC)   . Type II diabetes mellitus (HCC)     History:  obtained from ED rn & patient & brother. Tobacco/alcohol:smoker, patch on shoulder  Pt orientation to unit, room and routine. Information packet given to patient/family and safety video watched.  Admission INP armband ID verified with patient & brother and sister in law . SR up x 3, fall risk assessment complete with Patient and family verbalizing understanding of risks associated with falls. Pt verbalizes an understanding of how to use the call bell and to call for help before getting out of bed.  Skin, clean-dry- intact without evidence of bruising, or skin tears.   No evidence of skin break down noted on exam.     Will cont to monitor and assist as needed.  Eddie DibblesLauren M Brandin Stetzer, RN 01/03/2018 5:04 PM

## 2018-01-03 NOTE — ED Provider Notes (Signed)
Medical screening examination/treatment/procedure(s) were conducted as a shared visit with non-physician practitioner(s) and myself.  I personally evaluated the patient during the encounter.  None  Patient was referred to the emergency department from nursing home with fever.  Reported temperature was 100.2.  Patient has had chronic and ongoing foot from transmetatarsal amputation and poorly healing wound.  He was scheduled for AKA tomorrow.  Patient actually does not have extensive complaints.  He reports that it is painful and stops about it is mid lower leg.  He denies active nausea is requesting something to eat and drink.  On examination the patient is alert and nontoxic.  No respiratory distress at rest.  Lungs grossly clear.  Heart regular.  Patient has transmetatarsal amputation with large black eschar.  Diffuse erythema and swelling of the ankle and lower leg.  Foul odor.  Patient's diagnostic results show severe leukocytosis, acute renal failure and severe anemia.  Sepsis protocol initiated, PRBC ordered.  Etiology of acute renal failure unclear at this time.  Clinically, patient has better appearance and then diagnostic results suggest with clear mental status and no respiratory distress at rest..  Agree with initiating treatment for derangements as identified in the emergency department.  We will continue to monitor patient's vital signs and clinical appearance as is treatment initiated.  Plan for admission.   Arby BarrettePfeiffer, Betha Shadix, MD 01/03/18 1215

## 2018-01-04 ENCOUNTER — Other Ambulatory Visit: Payer: Self-pay

## 2018-01-04 ENCOUNTER — Inpatient Hospital Stay (HOSPITAL_COMMUNITY): Payer: Medicaid Other | Admitting: Anesthesiology

## 2018-01-04 ENCOUNTER — Encounter (HOSPITAL_COMMUNITY)
Admission: EM | Disposition: A | Discharge status: Skilled Nursing Facility | Payer: Self-pay | Source: Skilled Nursing Facility | Attending: Student in an Organized Health Care Education/Training Program

## 2018-01-04 ENCOUNTER — Encounter (HOSPITAL_COMMUNITY): Payer: Self-pay | Admitting: *Deleted

## 2018-01-04 ENCOUNTER — Inpatient Hospital Stay (HOSPITAL_COMMUNITY): Admission: RE | Admit: 2018-01-04 | Payer: Self-pay | Source: Ambulatory Visit | Admitting: Orthopedic Surgery

## 2018-01-04 DIAGNOSIS — R011 Cardiac murmur, unspecified: Secondary | ICD-10-CM

## 2018-01-04 DIAGNOSIS — I10 Essential (primary) hypertension: Secondary | ICD-10-CM

## 2018-01-04 DIAGNOSIS — T8781 Dehiscence of amputation stump: Secondary | ICD-10-CM

## 2018-01-04 DIAGNOSIS — N179 Acute kidney failure, unspecified: Secondary | ICD-10-CM

## 2018-01-04 DIAGNOSIS — Z89411 Acquired absence of right great toe: Secondary | ICD-10-CM

## 2018-01-04 DIAGNOSIS — A419 Sepsis, unspecified organism: Principal | ICD-10-CM

## 2018-01-04 HISTORY — PX: AMPUTATION: SHX166

## 2018-01-04 LAB — SURGICAL PCR SCREEN
MRSA, PCR: NEGATIVE
Staphylococcus aureus: POSITIVE — AB

## 2018-01-04 LAB — URINALYSIS, ROUTINE W REFLEX MICROSCOPIC
Bilirubin Urine: NEGATIVE
Glucose, UA: NEGATIVE mg/dL
Ketones, ur: NEGATIVE mg/dL
NITRITE: NEGATIVE
Protein, ur: 100 mg/dL — AB
SPECIFIC GRAVITY, URINE: 1.018 (ref 1.005–1.030)
pH: 5 (ref 5.0–8.0)

## 2018-01-04 LAB — CBC
HCT: 21 % — ABNORMAL LOW (ref 39.0–52.0)
HCT: 23.1 % — ABNORMAL LOW (ref 39.0–52.0)
HEMATOCRIT: 27.8 % — AB (ref 39.0–52.0)
HEMOGLOBIN: 8.5 g/dL — AB (ref 13.0–17.0)
HEMOGLOBIN: 6.6 g/dL — AB (ref 13.0–17.0)
Hemoglobin: 7.2 g/dL — ABNORMAL LOW (ref 13.0–17.0)
MCH: 26.8 pg (ref 26.0–34.0)
MCH: 27 pg (ref 26.0–34.0)
MCH: 27.3 pg (ref 26.0–34.0)
MCHC: 30.6 g/dL (ref 30.0–36.0)
MCHC: 31.2 g/dL (ref 30.0–36.0)
MCHC: 31.4 g/dL (ref 30.0–36.0)
MCV: 87.7 fL (ref 78.0–100.0)
MCV: 86.5 fL (ref 78.0–100.0)
MCV: 86.8 fL (ref 78.0–100.0)
PLATELETS: 353 10*3/uL (ref 150–400)
Platelets: 417 10*3/uL — ABNORMAL HIGH (ref 150–400)
Platelets: 351 10*3/uL (ref 150–400)
RBC: 3.17 MIL/uL — ABNORMAL LOW (ref 4.22–5.81)
RBC: 2.42 MIL/uL — AB (ref 4.22–5.81)
RBC: 2.67 MIL/uL — AB (ref 4.22–5.81)
RDW: 14.5 % (ref 11.5–15.5)
RDW: 14.6 % (ref 11.5–15.5)
RDW: 14.6 % (ref 11.5–15.5)
WBC: 27.9 10*3/uL — ABNORMAL HIGH (ref 4.0–10.5)
WBC: 32.9 10*3/uL — ABNORMAL HIGH (ref 4.0–10.5)
WBC: 34.9 10*3/uL — ABNORMAL HIGH (ref 4.0–10.5)

## 2018-01-04 LAB — BASIC METABOLIC PANEL
ANION GAP: 8 (ref 5–15)
Anion gap: 10 (ref 5–15)
Anion gap: 14 (ref 5–15)
BUN: 58 mg/dL — AB (ref 8–23)
BUN: 57 mg/dL — AB (ref 8–23)
BUN: 61 mg/dL — ABNORMAL HIGH (ref 8–23)
CALCIUM: 8.4 mg/dL — AB (ref 8.9–10.3)
CHLORIDE: 102 mmol/L (ref 98–111)
CHLORIDE: 103 mmol/L (ref 98–111)
CO2: 18 mmol/L — ABNORMAL LOW (ref 22–32)
CO2: 21 mmol/L — AB (ref 22–32)
CO2: 19 mmol/L — ABNORMAL LOW (ref 22–32)
CREATININE: 6.64 mg/dL — AB (ref 0.61–1.24)
Calcium: 8.6 mg/dL — ABNORMAL LOW (ref 8.9–10.3)
Calcium: 8.1 mg/dL — ABNORMAL LOW (ref 8.9–10.3)
Chloride: 108 mmol/L (ref 98–111)
Creatinine, Ser: 6.54 mg/dL — ABNORMAL HIGH (ref 0.61–1.24)
Creatinine, Ser: 7.23 mg/dL — ABNORMAL HIGH (ref 0.61–1.24)
GFR calc Af Amer: 9 mL/min — ABNORMAL LOW (ref >60)
GFR calc non Af Amer: 8 mL/min — ABNORMAL LOW (ref >60)
GFR calc non Af Amer: 8 mL/min — ABNORMAL LOW (ref >60)
GFR, EST AFRICAN AMERICAN: 8 mL/min — AB (ref >60)
GFR, EST AFRICAN AMERICAN: 9 mL/min — AB (ref >60)
GFR, EST NON AFRICAN AMERICAN: 7 mL/min — AB (ref >60)
GLUCOSE: 123 mg/dL — AB (ref 70–99)
Glucose, Bld: 124 mg/dL — ABNORMAL HIGH (ref 70–99)
Glucose, Bld: 126 mg/dL — ABNORMAL HIGH (ref 70–99)
Potassium: 4.9 mmol/L (ref 3.5–5.1)
Potassium: 5.5 mmol/L — ABNORMAL HIGH (ref 3.5–5.1)
Potassium: 5.9 mmol/L — ABNORMAL HIGH (ref 3.5–5.1)
SODIUM: 135 mmol/L (ref 135–145)
SODIUM: 134 mmol/L — AB (ref 135–145)
Sodium: 134 mmol/L — ABNORMAL LOW (ref 135–145)

## 2018-01-04 LAB — GLUCOSE, CAPILLARY
GLUCOSE-CAPILLARY: 120 mg/dL — AB (ref 70–99)
GLUCOSE-CAPILLARY: 109 mg/dL — AB (ref 70–99)
Glucose-Capillary: 122 mg/dL — ABNORMAL HIGH (ref 70–99)
Glucose-Capillary: 107 mg/dL — ABNORMAL HIGH (ref 70–99)
Glucose-Capillary: 117 mg/dL — ABNORMAL HIGH (ref 70–99)

## 2018-01-04 LAB — VANCOMYCIN, RANDOM: Vancomycin Rm: 22

## 2018-01-04 LAB — PREPARE RBC (CROSSMATCH)

## 2018-01-04 LAB — NA AND K (SODIUM & POTASSIUM), RAND UR
Potassium Urine: 35 mmol/L
SODIUM UR: 44 mmol/L

## 2018-01-04 SURGERY — AMPUTATION BELOW KNEE
Anesthesia: Monitor Anesthesia Care | Site: Leg Lower | Laterality: Left

## 2018-01-04 MED ORDER — MEPERIDINE HCL 50 MG/ML IJ SOLN: 6.2500 mg | INTRAMUSCULAR | Status: DC | PRN

## 2018-01-04 MED ORDER — 0.9 % SODIUM CHLORIDE (POUR BTL) OPTIME
TOPICAL | Status: DC | PRN
  Administered 2018-01-04: 1000 mL

## 2018-01-04 MED ORDER — BISACODYL 10 MG RE SUPP
10.0000 mg | Freq: Every day | RECTAL | Status: DC | PRN
  Administered 2018-01-23: 10 mg via RECTAL
  Filled 2018-01-04 (×2): qty 1

## 2018-01-04 MED ORDER — CEFAZOLIN SODIUM-DEXTROSE 2-4 GM/100ML-% IV SOLN
2.0000 g | INTRAVENOUS | Status: AC
  Administered 2018-01-04: 2 g via INTRAVENOUS
  Filled 2018-01-04: qty 100

## 2018-01-04 MED ORDER — INSULIN ASPART 100 UNIT/ML IV SOLN
10.0000 [IU] | Freq: Once | INTRAVENOUS | Status: AC
  Administered 2018-01-04: 10 [IU] via INTRAVENOUS

## 2018-01-04 MED ORDER — MIDAZOLAM HCL 2 MG/2ML IJ SOLN
INTRAMUSCULAR | Status: AC
  Filled 2018-01-04: qty 2

## 2018-01-04 MED ORDER — ONDANSETRON HCL 4 MG PO TABS: 4.0000 mg | ORAL_TABLET | Freq: Four times a day (QID) | ORAL | Status: DC | PRN

## 2018-01-04 MED ORDER — METHOCARBAMOL 500 MG PO TABS
500.0000 mg | ORAL_TABLET | Freq: Four times a day (QID) | ORAL | Status: DC | PRN
  Administered 2018-01-11 – 2018-01-16 (×3): 500 mg via ORAL
  Filled 2018-01-04 (×3): qty 1

## 2018-01-04 MED ORDER — SODIUM CHLORIDE 0.9% IV SOLUTION
Freq: Once | INTRAVENOUS | Status: AC
  Administered 2018-01-04: 23:00:00 via INTRAVENOUS

## 2018-01-04 MED ORDER — HYDROMORPHONE HCL 1 MG/ML IJ SOLN
0.5000 mg | INTRAMUSCULAR | Status: DC | PRN
  Administered 2018-01-04: 1 mg via INTRAVENOUS
  Filled 2018-01-04: qty 1

## 2018-01-04 MED ORDER — POLYETHYLENE GLYCOL 3350 17 G PO PACK
17.0000 g | PACK | Freq: Every day | ORAL | Status: DC | PRN
  Administered 2018-01-07 – 2018-01-22 (×7): 17 g via ORAL
  Filled 2018-01-04 (×8): qty 1

## 2018-01-04 MED ORDER — METHOCARBAMOL 1000 MG/10ML IJ SOLN
500.0000 mg | Freq: Four times a day (QID) | INTRAVENOUS | Status: DC | PRN
  Filled 2018-01-04: qty 5

## 2018-01-04 MED ORDER — EPHEDRINE SULFATE 50 MG/ML IJ SOLN
INTRAMUSCULAR | Status: DC | PRN
  Administered 2018-01-04: 10 mg via INTRAVENOUS

## 2018-01-04 MED ORDER — PROMETHAZINE HCL 25 MG/ML IJ SOLN: 6.2500 mg | INTRAMUSCULAR | Status: DC | PRN

## 2018-01-04 MED ORDER — LIDOCAINE HCL (CARDIAC) PF 100 MG/5ML IV SOSY
PREFILLED_SYRINGE | INTRAVENOUS | Status: DC | PRN
  Administered 2018-01-04: 30 mg via INTRATRACHEAL

## 2018-01-04 MED ORDER — HYDROMORPHONE HCL 1 MG/ML IJ SOLN: 0.2500 mg | INTRAMUSCULAR | Status: DC | PRN

## 2018-01-04 MED ORDER — FENTANYL CITRATE (PF) 250 MCG/5ML IJ SOLN
INTRAMUSCULAR | Status: DC | PRN
  Administered 2018-01-04: 50 ug via INTRAVENOUS

## 2018-01-04 MED ORDER — LIDOCAINE HCL (PF) 2 % IJ SOLN
INTRAMUSCULAR | Status: DC | PRN
  Administered 2018-01-04: 10 mL via INTRADERMAL

## 2018-01-04 MED ORDER — OXYCODONE HCL 5 MG PO TABS
10.0000 mg | ORAL_TABLET | ORAL | Status: DC | PRN
  Administered 2018-01-10 – 2018-01-11 (×5): 10 mg via ORAL
  Administered 2018-01-12: 15 mg via ORAL
  Administered 2018-01-12 (×2): 10 mg via ORAL
  Administered 2018-01-13 – 2018-01-17 (×14): 15 mg via ORAL
  Administered 2018-01-18 – 2018-01-20 (×4): 10 mg via ORAL
  Administered 2018-01-22: 15 mg via ORAL
  Filled 2018-01-04: qty 3
  Filled 2018-01-04: qty 2
  Filled 2018-01-04: qty 3
  Filled 2018-01-04 (×3): qty 2
  Filled 2018-01-04 (×4): qty 3
  Filled 2018-01-04: qty 2
  Filled 2018-01-04: qty 3
  Filled 2018-01-04 (×4): qty 2
  Filled 2018-01-04 (×6): qty 3
  Filled 2018-01-04: qty 2
  Filled 2018-01-04: qty 3
  Filled 2018-01-04: qty 2
  Filled 2018-01-04 (×2): qty 3

## 2018-01-04 MED ORDER — GLYCOPYRROLATE 0.2 MG/ML IJ SOLN
INTRAMUSCULAR | Status: DC | PRN
  Administered 2018-01-04: 0.2 mg via INTRAVENOUS

## 2018-01-04 MED ORDER — METOCLOPRAMIDE HCL 10 MG PO TABS: 5.0000 mg | ORAL_TABLET | Freq: Three times a day (TID) | ORAL | Status: DC | PRN

## 2018-01-04 MED ORDER — MAGNESIUM CITRATE PO SOLN: 1.0000 | Freq: Once | ORAL | Status: DC | PRN

## 2018-01-04 MED ORDER — MUPIROCIN 2 % EX OINT
1.0000 "application " | TOPICAL_OINTMENT | Freq: Two times a day (BID) | CUTANEOUS | Status: DC
  Administered 2018-01-04 – 2018-01-05 (×4): 1 via NASAL
  Filled 2018-01-04 (×2): qty 22

## 2018-01-04 MED ORDER — SODIUM CHLORIDE 0.9 % IV SOLN
INTRAVENOUS | Status: DC
  Administered 2018-01-04: 12:00:00 via INTRAVENOUS

## 2018-01-04 MED ORDER — ACETAMINOPHEN 325 MG PO TABS: 325.0000 mg | ORAL_TABLET | Freq: Four times a day (QID) | ORAL | Status: DC | PRN

## 2018-01-04 MED ORDER — SODIUM CHLORIDE 0.9 % IV SOLN
INTRAVENOUS | Status: DC
Start: 2018-01-04 — End: 2018-01-04
  Administered 2018-01-04 (×2): via INTRAVENOUS

## 2018-01-04 MED ORDER — FENTANYL CITRATE (PF) 100 MCG/2ML IJ SOLN
50.0000 ug | Freq: Once | INTRAMUSCULAR | Status: AC
  Administered 2018-01-04: 50 ug via INTRAVENOUS

## 2018-01-04 MED ORDER — GABAPENTIN 300 MG PO CAPS: 300.0000 mg | ORAL_CAPSULE | Freq: Three times a day (TID) | ORAL | Status: DC

## 2018-01-04 MED ORDER — ONDANSETRON HCL 4 MG/2ML IJ SOLN: 4.0000 mg | Freq: Four times a day (QID) | INTRAMUSCULAR | Status: DC | PRN

## 2018-01-04 MED ORDER — DOCUSATE SODIUM 100 MG PO CAPS
100.0000 mg | ORAL_CAPSULE | Freq: Two times a day (BID) | ORAL | Status: DC
  Administered 2018-01-04 – 2018-01-23 (×33): 100 mg via ORAL
  Filled 2018-01-04 (×35): qty 1

## 2018-01-04 MED ORDER — CHLORHEXIDINE GLUCONATE CLOTH 2 % EX PADS
6.0000 | MEDICATED_PAD | Freq: Every day | CUTANEOUS | Status: DC
  Administered 2018-01-04 – 2018-01-05 (×2): 6 via TOPICAL

## 2018-01-04 MED ORDER — STERILE WATER FOR INJECTION IV SOLN
INTRAVENOUS | Status: DC
  Administered 2018-01-04 – 2018-01-10 (×10): via INTRAVENOUS
  Filled 2018-01-04 (×16): qty 9.71

## 2018-01-04 MED ORDER — OXYCODONE HCL 5 MG PO TABS
5.0000 mg | ORAL_TABLET | ORAL | Status: DC | PRN
  Administered 2018-01-09 – 2018-01-23 (×7): 10 mg via ORAL
  Filled 2018-01-04 (×7): qty 2

## 2018-01-04 MED ORDER — PROPOFOL 10 MG/ML IV BOLUS
INTRAVENOUS | Status: DC | PRN
  Administered 2018-01-04: 50 mg via INTRAVENOUS

## 2018-01-04 MED ORDER — CHLORHEXIDINE GLUCONATE 4 % EX LIQD
60.0000 mL | Freq: Once | CUTANEOUS | Status: AC
  Administered 2018-01-04: 4 via TOPICAL
  Filled 2018-01-04: qty 60

## 2018-01-04 MED ORDER — FENTANYL CITRATE (PF) 100 MCG/2ML IJ SOLN
INTRAMUSCULAR | Status: AC
  Administered 2018-01-04: 50 ug via INTRAVENOUS
  Filled 2018-01-04: qty 2

## 2018-01-04 MED ORDER — METOCLOPRAMIDE HCL 5 MG/ML IJ SOLN: 5.0000 mg | Freq: Three times a day (TID) | INTRAMUSCULAR | Status: DC | PRN

## 2018-01-04 MED ORDER — METOPROLOL TARTRATE 100 MG PO TABS
100.0000 mg | ORAL_TABLET | Freq: Two times a day (BID) | ORAL | Status: DC
  Administered 2018-01-04 – 2018-01-23 (×33): 100 mg via ORAL
  Filled 2018-01-04 (×36): qty 1

## 2018-01-04 MED ORDER — PHENYLEPHRINE HCL 10 MG/ML IJ SOLN
INTRAMUSCULAR | Status: DC | PRN
  Administered 2018-01-04 (×2): 120 ug via INTRAVENOUS
  Administered 2018-01-04: 160 ug via INTRAVENOUS

## 2018-01-04 MED ORDER — PATIROMER SORBITEX CALCIUM 8.4 G PO PACK
8.4000 g | PACK | ORAL | Status: AC
  Administered 2018-01-04: 8.4 g via ORAL
  Filled 2018-01-04 (×2): qty 1

## 2018-01-04 MED ORDER — SODIUM CHLORIDE 0.9 % IV SOLN
INTRAVENOUS | Status: DC
  Administered 2018-01-06: 12:00:00 via INTRAVENOUS

## 2018-01-04 MED ORDER — GABAPENTIN 300 MG PO CAPS
300.0000 mg | ORAL_CAPSULE | Freq: Every day | ORAL | Status: DC
  Administered 2018-01-06 – 2018-01-23 (×16): 300 mg via ORAL
  Filled 2018-01-04 (×16): qty 1

## 2018-01-04 MED ORDER — ROPIVACAINE HCL 7.5 MG/ML IJ SOLN
INTRAMUSCULAR | Status: DC | PRN
  Administered 2018-01-04: 40 mL via PERINEURAL

## 2018-01-04 MED ORDER — DEXTROSE 50 % IV SOLN
1.0000 | Freq: Once | INTRAVENOUS | Status: AC
  Administered 2018-01-04: 50 mL via INTRAVENOUS
  Filled 2018-01-04: qty 50

## 2018-01-04 MED ORDER — PROPOFOL 500 MG/50ML IV EMUL
INTRAVENOUS | Status: DC | PRN
  Administered 2018-01-04: 25 ug/kg/min via INTRAVENOUS

## 2018-01-04 SURGICAL SUPPLY — 35 items
APL SKNCLS STERI-STRIP NONHPOA (GAUZE/BANDAGES/DRESSINGS) ×1
BENZOIN TINCTURE PRP APPL 2/3 (GAUZE/BANDAGES/DRESSINGS) ×5 IMPLANT
BLADE SAW RECIP 87.9 MT (BLADE) ×2 IMPLANT
BLADE SURG 21 STRL SS (BLADE) ×2 IMPLANT
BNDG COHESIVE 6X5 TAN STRL LF (GAUZE/BANDAGES/DRESSINGS) ×4 IMPLANT
BNDG GAUZE ELAST 4 BULKY (GAUZE/BANDAGES/DRESSINGS) ×4 IMPLANT
COVER SURGICAL LIGHT HANDLE (MISCELLANEOUS) ×2 IMPLANT
DRAPE INCISE IOBAN 66X45 STRL (DRAPES) ×1 IMPLANT
DRAPE U-SHAPE 47X51 STRL (DRAPES) ×2 IMPLANT
DRESSING PREVENA PLUS CUSTOM (GAUZE/BANDAGES/DRESSINGS) ×1 IMPLANT
DRSG PREVENA PLUS CUSTOM (GAUZE/BANDAGES/DRESSINGS) ×2
DURAPREP 26ML APPLICATOR (WOUND CARE) ×2 IMPLANT
ELECT REM PT RETURN 9FT ADLT (ELECTROSURGICAL) ×2
ELECTRODE REM PT RTRN 9FT ADLT (ELECTROSURGICAL) ×1 IMPLANT
GLOVE BIOGEL PI IND STRL 9 (GLOVE) ×1 IMPLANT
GLOVE BIOGEL PI INDICATOR 9 (GLOVE) ×1
GLOVE SURG ORTHO 9.0 STRL STRW (GLOVE) ×2 IMPLANT
GOWN STRL REUS W/ TWL XL LVL3 (GOWN DISPOSABLE) ×2 IMPLANT
GOWN STRL REUS W/TWL XL LVL3 (GOWN DISPOSABLE) ×4
KIT BASIN OR (CUSTOM PROCEDURE TRAY) ×2 IMPLANT
KIT TURNOVER KIT B (KITS) ×2 IMPLANT
MANIFOLD NEPTUNE II (INSTRUMENTS) ×2 IMPLANT
NS IRRIG 1000ML POUR BTL (IV SOLUTION) ×2 IMPLANT
PACK ORTHO EXTREMITY (CUSTOM PROCEDURE TRAY) ×2 IMPLANT
PAD ARMBOARD 7.5X6 YLW CONV (MISCELLANEOUS) ×2 IMPLANT
SPONGE LAP 18X18 X RAY DECT (DISPOSABLE) ×1 IMPLANT
STAPLER VISISTAT 35W (STAPLE) ×1 IMPLANT
STOCKINETTE IMPERVIOUS LG (DRAPES) ×2 IMPLANT
SUT SILK 2 0 (SUTURE) ×2
SUT SILK 2-0 18XBRD TIE 12 (SUTURE) ×1 IMPLANT
SUT VIC AB 1 CTX 27 (SUTURE) IMPLANT
TOWEL OR 17X26 10 PK STRL BLUE (TOWEL DISPOSABLE) ×2 IMPLANT
TUBE CONNECTING 12X1/4 (SUCTIONS) ×2 IMPLANT
WND VAC CANISTER 500ML (MISCELLANEOUS) ×1 IMPLANT
YANKAUER SUCT BULB TIP NO VENT (SUCTIONS) ×2 IMPLANT

## 2018-01-04 NOTE — Care Management Note (Signed)
Case Management Note  Patient Details  Name: Joel Hebert MRN: [id number] Date of Birth: 06/18/1956  Subjective/Objective:        Presents 2 weeks status post left transmetatarsal amputation. Pt with progressive wound dehiscence of the left transmetatarsal amputation with ischemic gangrenous changes.       02/03/2018 s/p LEFT BELOW KNEE AMPUTATION               Action/Plan:  PT evaluation pending... NCM following for disposition needs  Expected Discharge Date:                  Expected Discharge Plan:     In-House Referral:    CSW Discharge planning Services  CM Consult  Post Acute Care Choice:    Choice offered to:  Patient  DME Arranged:    DME Agency:     HH Arranged:    HH Agency:     Status of Service:  In process, will continue to follow  If discussed at Long Length of Stay Meetings, dates discussed:    Additional Comments:  Epifanio LeschesCole, Cayce Quezada Hudson, RN 01/04/2018, 4:35 PM

## 2018-01-04 NOTE — Progress Notes (Signed)
Subjective: The patient was resting in his bed today upon entering. He denied dyspnea, chest pain, fever or muscle pain. He did not recall speaking to any physicians earlier in the day and was not aware of today's potential plan for surgery as per the Encounters tab. We informed him that he may potentially be heading for surgery but we were unable to confirm this definitively for him. He denied additional questions.  Objective:  Vital signs in last 24 hours: Vitals:   01/03/18 2021 01/04/18 0442 01/04/18 0610 01/04/18 0625  BP: (!) 127/52  (!) 178/76 (!) 168/82  Pulse: 90  (!) 113   Resp: 16  17   Temp: 98.7 F (37.1 C) 99 F (37.2 C) 99.4 F (37.4 C)   TempSrc: Oral Oral Oral   SpO2: 95%  94%   Weight:   205 lb 0.4 oz (93 kg)   Height:       Physical Exam  Constitutional: He appears well-nourished. No distress.  Cardiovascular: Normal rate and regular rhythm.  Murmur heard. Pulmonary/Chest: Effort normal and breath sounds normal. No respiratory distress.  Abdominal: Soft. Bowel sounds are normal. He exhibits no distension.  Musculoskeletal: He exhibits edema, tenderness and deformity.  All of the left foot, see image and R. Great toe.  Neurological: He is alert.  Skin: Skin is warm. There is erythema.   Assessment/Plan:  Active Problems:   Sepsis [facility])  Assessment: Joel Hebert is a 62 yo M with a pmhx of HTN, HLD, Type II DM, CAD, PAD, and recent transmetatarsal amputation of the left foot who was sent from SNF today with temperature of 100.18F. On exam patient has gangrenous changes with dehiscence of his transmetatarsal wound with purulent discharge. Labs with significant leukocytosis, acute renal failure, and acute on chronic anemia. He was admitted for surgery by Dr. Lajoyce Corners with orthopedics and treated for his renal failure, anemia and sepsis.   Wet Gangrene of Left Transmetatarsal Stump:  To undergo left transtibial amputation per Dr. Audrie Lia note, likely today to  better gain source control of his sepsis. Patient remained afebrile overnight, but tachycardic and hypertensive. -- Continue IV cefepime -- Added IV metronidazole  -- Blood cx no growth x 24 hours  -- Dr. Lajoyce Corners consulted per EDP; appreciate recommendations  -- Follow fever & wbc curve  -- WBC remain elevated -- Trend lactic acid cleared  -- Percocet PRN for pain   Acute Renal Failure:  Unclear etiology, patient denies recent medication changes. Possibly due to sepsis with underlying infection. Will touch base with SNF regarding medications, recent NSAID use, ect. He denies a decrease in UOP or difficulty urinating.  -- Renal U/S to rule out obstruction was unremarkable -- UA ordered, not collected, ordered Foley placed to assess -- S/p ~4L NS -- Strict I&Os ordered but not recorded -- Monitor volume status, labs  -- nephrology consulted, will gladly see patient, we appreciate their assistance  -- Avoid nephrotoxins (stopped vancomycin) ordered vanc level random -- Holding lisinopril-HCTZ  Acute on Chronic Anemia:  Uncertain etiology. MCV WNL's, appears to be -- Start Vit-B12 supplements given -- Transfuse pRBC one unit -- Follow up repeat CBC with Hgb 8.4  HTN:  BP is markedly hypertensive. Will reassess following surgery -- Continue metop 100mg  BID -- Holding lisinopril-HCTZ given AKI   Type II DM:  Last A1c on 12/14/2017 was 7.2 -- Hold metformin given lactic acidosis and infection -- SSI-sensitive TID AC + QHS -- CBG checks QID   PVD: --  Continue ASA & Plavix  -- Holding pentoxifylline in the setting of renal failure   Diet: NPO for surgery Code: Full Fluids: 13750ml/hr normal saline GI PPX: Senokot VTE PPX: Will need to start heparin but will wait until after surgery Dispo: Anticipated discharge in approximately 3 day(s).   Lanelle BalHarbrecht, Joel Ridings, MD 01/04/2018, 10:08 AM Pager: Pager# [phone]

## 2018-01-04 NOTE — Op Note (Signed)
   Date of Surgery: 01/04/2018  INDICATIONS: [name] is a 62 y.o.-year-old male who has dry gangrene of left transmetatarsal amputation.  PREOPERATIVE DIAGNOSIS: gangrene left foot  POSTOPERATIVE DIAGNOSIS: Same.  PROCEDURE: Transtibial amputation Application of Prevena wound VAC  SURGEON: Lajoyce Cornersuda, M.D.  ANESTHESIA:  general  IV FLUIDS AND URINE: See anesthesia.  ESTIMATED BLOOD LOSS: 250 mL.  COMPLICATIONS: None.  DESCRIPTION OF PROCEDURE: The patient was brought to the operating room and underwent a general anesthetic. After adequate levels of anesthesia were obtained patient's lower extremity was prepped using DuraPrep draped into a sterile field. A timeout was called. The foot was draped out of the sterile field with impervious stockinette. A transverse incision was made 11 cm distal to the tibial tubercle. This curved proximally and a large posterior flap was created. The tibia was transected 1 cm proximal to the skin incision. The fibula was transected just proximal to the tibial incision. The tibia was beveled anteriorly. A large posterior flap was created. The sciatic nerve was pulled cut and allowed to retract. The vascular bundles were suture ligated with 2-0 silk. The deep and superficial fascial layers were closed using #1 Vicryl. The skin was closed using staples and 2-0 nylon. The wound was covered with a Prevena wound VAC. There was a good suction fit. A prosthetic shrinker was applied. Patient was extubated taken to the PACU in stable condition.   DISCHARGE PLANNING:  Antibiotic duration:24 hours post operative  Weightbearing: NWB left  Pain medication: ordered  Dressing care/ Wound JYN:[id number]:continue for 1 week  Discharge to: rehab  Follow-up: In the office 1 week post operative.  Aldean BakerMarcus Duda, MD [facility] 1:11 PM

## 2018-01-04 NOTE — Anesthesia Procedure Notes (Signed)
Anesthesia Regional Block: Popliteal block   Pre-Anesthetic Checklist: ,, timeout performed, Correct Patient, Correct Site, Correct Laterality, Correct Procedure, Correct Position, site marked, Risks and benefits discussed,  Surgical consent,  Pre-op evaluation,  At surgeon's request and post-op pain management  Laterality: Left  Prep: chloraprep       Needles:  Injection technique: Single-shot  Needle Type: Stimiplex     Needle Length: 10cm  Needle Gauge: 21     Additional Needles:   Procedures:,,,, ultrasound used (permanent image in chart),,,,  Motor weakness within 5 minutes.   Nerve Stimulator or Paresthesia:  Response: 0.5 mA,   Additional Responses:   Narrative:  Start time: 01/04/2018 12:12 PM End time: 01/04/2018 12:15 PM Injection made incrementally with aspirations every 5 mL.  Performed by: Personally  Anesthesiologist: Lewie LoronGermeroth, Nickalaus Crooke, MD  Additional Notes: Nerve located and needle positioned with direct ultrasound guidance. Good perineural spread. Patient tolerated well.

## 2018-01-04 NOTE — Consult Note (Addendum)
[facility] Nephrology Consultation Note  Requesting MD: Dr. Evette Doffing Reason for consult: AKI  HPI:  Joel Hebert is a 62 y.o. male. with history of hypertension, diabetes (a1c 7.2), coronary artery disease, peripheral artery disease, CKD stage III, gangrene of left foot, recent transmetatarsal amputation of the left foot who was sent from a skilled nursing facility for the temperature of 100.2.  Patient was found to have sepsis with gangrenous changes and dehiscence of his transmetatarsal wound with purulent discharge.  Patient was taken to the OR for amputation today by Dr. today.  Nephrology was consulted for the management of acute kidney injury.  Patient has baseline creatinine level around 1.3.  On admission his creatinine level was found to have 6.29, potassium 5.2.  Treated with IV fluid.  On cefepime and Flagyl.  Vancomycin was discontinued.  Today, the serum creatinine level increased to 6.6, potassium 5.5.    Patient came from the OR.  Still has some effect of sedation.  He denied headache, dizziness, nausea vomiting chest pain or shortness of breath.  No pain currently.  He has intraurethral Foley catheter.  The ultrasound of kidneys with normal echogenicity with no hydronephrosis.  On reviewing home medication, patient was on diclofenac twice a day, lisinopril and hydrochlorothiazide at skilled nursing facility.  Creatinine, Ser  Date/Time Value Ref Range Status  01/04/2018 06:49 AM 6.64 (H) 0.61 - 1.24 mg/dL Final  01/03/2018 11:48 PM 6.54 (H) 0.61 - 1.24 mg/dL Final  01/03/2018 05:08 PM 6.30 (H) 0.61 - 1.24 mg/dL Final  01/03/2018 10:56 AM 6.29 (H) 0.61 - 1.24 mg/dL Final  12/14/2017 09:41 AM 1.05 0.61 - 1.24 mg/dL Final  11/07/2017 05:51 AM 1.11 0.61 - 1.24 mg/dL Final  11/06/2017 09:14 AM 1.02 0.61 - 1.24 mg/dL Final  10/10/2017 04:12 AM 1.27 (H) 0.61 - 1.24 mg/dL Final  10/09/2017 02:17 PM 1.25 (H) 0.61 - 1.24 mg/dL Final  10/09/2017 08:51 AM 1.49  (H) 0.61 - 1.24 mg/dL Final  09/06/2017 03:56 AM 1.46 (H) 0.61 - 1.24 mg/dL Final  09/05/2017 06:18 PM 1.50 (H) 0.61 - 1.24 mg/dL Final  09/05/2017 11:42 AM 1.80 (H) 0.61 - 1.24 mg/dL Final  08/21/2017 03:23 AM 1.35 (H) 0.61 - 1.24 mg/dL Final  08/20/2017 01:45 PM 1.30 (H) 0.61 - 1.24 mg/dL Final  08/15/2017 03:43 AM 1.31 (H) 0.61 - 1.24 mg/dL Final  08/14/2017 09:37 AM 1.50 (H) 0.61 - 1.24 mg/dL Final  08/05/2017 03:08 AM 1.20 0.61 - 1.24 mg/dL Final     PMHx:   Past Medical History:  Diagnosis Date  . Coronary artery disease   . High cholesterol   . Hypertension   . MI (myocardial infarction) ([place]) 08/13/2014; 01/2016- 03/2016 X 3  . PAD (peripheral artery disease) ([place])   . Peripheral vascular disease ([place])   . Type II diabetes mellitus ([place])     Past Surgical History:  Procedure Laterality Date  . ABDOMINAL AORTOGRAM W/LOWER EXTREMITY  08/14/2017  . ABDOMINAL AORTOGRAM W/LOWER EXTREMITY N/A 08/14/2017   Procedure: ABDOMINAL AORTOGRAM W/LOWER EXTREMITY;  Surgeon: Serafina Mitchell, MD;  Location: [facility];  Service: Cardiovascular;  Laterality: N/A;  . ABDOMINAL AORTOGRAM W/LOWER EXTREMITY N/A 09/05/2017   Procedure: ABDOMINAL AORTOGRAM W/LOWER EXTREMITY;  Surgeon: Waynetta Sandy, MD;  Location: [facility];  Service: Cardiovascular;  Laterality: N/A;  . AMPUTATION Left 10/09/2017   Procedure: AMPUTATION DIGIT LEFT FIFTH TOE;  Surgeon: Waynetta Sandy, MD;  Location: [facility];  Service: Vascular;  Laterality: Left;  . AMPUTATION Left 12/14/2017   Procedure: LEFT TRANSMETATARSAL AMPUTATION;  Surgeon: Newt Minion, MD;  Location: [facility];  Service: Orthopedics;  Laterality: Left;  . AMPUTATION TOE Left 10/09/2017   left foot 5th toe  . CORONARY ANGIOPLASTY WITH STENT PLACEMENT  01/2016; 03/2016   "1 + 1" (08/14/2017)  . LOWER EXTREMITY ANGIOGRAPHY N/A 08/20/2017   Procedure: LOWER EXTREMITY ANGIOGRAPHY-rt leg;  Surgeon: Elam Dutch, MD;  Location: [facility];  Service: Cardiovascular;  Laterality: N/A;  . PERIPHERAL VASCULAR ATHERECTOMY  08/14/2017   Procedure: PERIPHERAL VASCULAR ATHERECTOMY;  Surgeon: Serafina Mitchell, MD;  Location: [facility];  Service: Cardiovascular;;  SFA   . PERIPHERAL VASCULAR ATHERECTOMY Left 09/05/2017   Procedure: PERIPHERAL VASCULAR ATHERECTOMY;  Surgeon: Waynetta Sandy, MD;  Location: [facility];  Service: Cardiovascular;  Laterality: Left;  Tibioperoneal and posterior tibial  . PERIPHERAL VASCULAR BALLOON ANGIOPLASTY  08/14/2017   Procedure: PERIPHERAL VASCULAR BALLOON ANGIOPLASTY;  Surgeon: Serafina Mitchell, MD;  Location: [facility];  Service: Cardiovascular;;  peroneal  . PERIPHERAL VASCULAR BALLOON ANGIOPLASTY  08/20/2017   Procedure: PERIPHERAL VASCULAR BALLOON ANGIOPLASTY;  Surgeon: Elam Dutch, MD;  Location: [facility];  Service: Cardiovascular;;  . PERIPHERAL VASCULAR INTERVENTION  08/14/2017   Procedure: PERIPHERAL VASCULAR INTERVENTION;  Surgeon: Serafina Mitchell, MD;  Location: [facility];  Service: Cardiovascular;;  SFA stent  . TONSILLECTOMY    . WOUND DEBRIDEMENT Left 11/06/2017   Procedure: DEBRIDEMENT WOUND TOE BED OF FIFTH TOE LEFT FOOT;  Surgeon: Waynetta Sandy, MD;  [facility];  Service: Vascular;  Laterality: Left;    Family Hx:  Family History  Problem Relation Age of Onset  . Heart failure Brother   . Heart disease Brother   . Kidney disease Mother   . Heart disease Father     Social History:  reports that he has been smoking cigarettes.  He started smoking about 43 years ago. He has a 10.75 pack-year smoking history. He has never used smokeless tobacco. He reports that he drank alcohol. He reports that he does not use drugs.  Allergies: No Known Allergies  Medications: Prior to Admission medications   Medication Sig Start Date End Date Taking? Authorizing Provider  acetaminophen (TYLENOL) 500 MG tablet  Take 500 mg by mouth every 6 (six) hours as needed for mild pain or headache.    Yes [provider]  aspirin EC 81 MG tablet Take 81 mg by mouth 2 (two) times daily.    Yes [provider]  atorvastatin (LIPITOR) 20 MG tablet Take 20 mg by mouth daily.   Yes [provider]  clopidogrel (PLAVIX) 75 MG tablet Take 1 tablet (75 mg total) by mouth daily with breakfast. 08/15/17  Yes Rhyne, Samantha J, PA-C  diclofenac (VOLTAREN) 50 MG EC tablet Take 1 tablet (50 mg total) by mouth 2 (two) times daily. 07/08/17  Yes Nat Christen, MD  gabapentin (NEURONTIN) 300 MG capsule Take 1 capsule (300 mg total) by mouth 2 (two) times daily. 09/28/17  Yes Nickel, Sharmon Leyden, NP  insulin aspart (NOVOLOG) 100 UNIT/ML injection Inject 0-15 Units into the skin 3 (three) times daily with meals. 12/18/17  Yes Newt Minion, MD  lisinopril-hydrochlorothiazide (PRINZIDE,ZESTORETIC) 20-25 MG tablet Take 1 tablet by mouth daily.  07/24/17  Yes [provider]  metFORMIN (GLUCOPHAGE) 850 MG tablet Take 850 mg by mouth 2 (two) times  daily with a meal.   Yes [provider]  methocarbamol (ROBAXIN) 500 MG tablet Take 1 tablet (500 mg total) by mouth every 8 (eight) hours as needed for muscle spasms. 12/18/17  Yes Newt Minion, MD  metoprolol tartrate (LOPRESSOR) 100 MG tablet Take 100 mg by mouth 2 (two) times daily.   Yes [provider]  nitroGLYCERIN (NITRODUR - DOSED IN MG/24 HR) 0.2 mg/hr patch Place 1 patch (0.2 mg total) onto the skin daily. 11/19/17  Yes Newt Minion, MD  nitroGLYCERIN (NITROSTAT) 0.4 MG SL tablet Place 0.4 mg under the tongue every 5 (five) minutes as needed for chest pain.   Yes [provider]  oxyCODONE-acetaminophen (PERCOCET) 10-325 MG tablet Take 1 tablet by mouth every 4 (four) hours as needed for pain. 12/18/17  Yes Newt Minion, MD  pentoxifylline (TRENTAL) 400 MG CR tablet Take 1 tablet (400 mg total) by mouth 3 (three) times daily  with meals. 11/19/17  Yes Newt Minion, MD  rosuvastatin (CRESTOR) 20 MG tablet Take 20 mg by mouth every evening.    Yes [provider]  methocarbamol (ROBAXIN) 500 MG tablet Take 1 tablet (500 mg total) by mouth every 8 (eight) hours as needed for muscle spasms. Patient not taking: Reported on 01/03/2018 12/18/17   Newt Minion, MD  oxyCODONE-acetaminophen (PERCOCET) 10-325 MG tablet Take 1 tablet by mouth every 8 (eight) hours as needed for pain. Patient not taking: Reported on 01/03/2018 12/06/17   Newt Minion, MD    I have reviewed the patient's current medications.  Labs:  Results for orders placed or performed during the hospital encounter of 01/03/18 (from the past 48 hour(s))  CBC with Differential     Status: Abnormal   Collection Time: 01/03/18 10:56 AM  Result Value Ref Range   WBC 36.2 (H) 4.0 - 10.5 K/uL   RBC 2.53 (L) 4.22 - 5.81 MIL/uL   Hemoglobin 6.8 (LL) 13.0 - 17.0 g/dL    Comment: REPEATED TO VERIFY CRITICAL RESULT CALLED TO, READ BACK BY AND VERIFIED WITH: Mali GROSE,RN AT 1139 01/03/18 BY ZBEECH.    HCT 21.9 (L) 39.0 - 52.0 %   MCV 86.6 78.0 - 100.0 fL   MCH 26.9 26.0 - 34.0 pg   MCHC 31.1 30.0 - 36.0 g/dL   RDW 14.2 11.5 - 15.5 %   Platelets 452 (H) 150 - 400 K/uL   Neutrophils Relative % 88 %   Lymphocytes Relative 5 %   Monocytes Relative 6 %   Eosinophils Relative 1 %   Basophils Relative 0 %   Neutro Abs 31.8 (H) 1.7 - 7.7 K/uL   Lymphs Abs 1.8 0.7 - 4.0 K/uL   Monocytes Absolute 2.2 (H) 0.1 - 1.0 K/uL   Eosinophils Absolute 0.4 0.0 - 0.7 K/uL   Basophils Absolute 0.0 0.0 - 0.1 K/uL    Comment: Performed at [facility] [address], [place], [place] [address]  Comprehensive metabolic panel     Status: Abnormal   Collection Time: 01/03/18 10:56 AM  Result Value Ref Range   Sodium 134 (L) 135 - 145 mmol/L   Potassium 5.2 (H) 3.5 - 5.1 mmol/L   Chloride 98 98 - 111 mmol/L    Comment: Please note change in reference range.    CO2 22 22 - 32 mmol/L   Glucose, Bld 154 (H) 70 - 99 mg/dL    Comment: Please note change in reference range.   BUN  56 (H) 8 - 23 mg/dL    Comment: Please note change in reference range.   Creatinine, Ser 6.29 (H) 0.61 - 1.24 mg/dL   Calcium 9.3 8.9 - 10.3 mg/dL   Total Protein 6.6 6.5 - 8.1 g/dL   Albumin 2.2 (L) 3.5 - 5.0 g/dL   AST 39 15 - 41 U/L   ALT 25 0 - 44 U/L    Comment: Please note change in reference range.   Alkaline Phosphatase 195 (H) 38 - 126 U/L   Total Bilirubin 0.3 0.3 - 1.2 mg/dL   GFR calc non Af Amer 8 (L) >60 mL/min   GFR calc Af Amer 10 (L) >60 mL/min    Comment: (NOTE) The eGFR has been calculated using the CKD EPI equation. This calculation has not been validated in all clinical situations. eGFR's persistently <60 mL/min signify possible Chronic Kidney Disease.    Anion gap 14 5 - 15    Comment: Performed at [facility] [address], [place], [place] [address]  I-Stat CG4 Lactic Acid, ED     Status: Abnormal   Collection Time: 01/03/18 11:01 AM  Result Value Ref Range   Lactic Acid, Venous 3.24 (HH) 0.5 - 1.9 mmol/L   Comment NOTIFIED PHYSICIAN   Blood Culture (routine x 2)     Status: None (Preliminary result)   Collection Time: 01/03/18 12:06 PM  Result Value Ref Range   Specimen Description BLOOD RIGHT ANTECUBITAL    Special Requests      BOTTLES DRAWN AEROBIC AND ANAEROBIC Blood Culture results may not be optimal due to an excessive volume of blood received in culture bottles   Culture      NO GROWTH < 24 HOURS Performed at [facility] [address] [place], [place] [address]    Report Status PENDING   Blood Culture (routine x 2)     Status: None (Preliminary result)   Collection Time: 01/03/18 12:07 PM  Result Value Ref Range   Specimen Description BLOOD RIGHT HAND    Special Requests      BOTTLES DRAWN AEROBIC ONLY Blood Culture results may not be optimal due to an excessive volume of blood received in culture bottles    Culture      NO GROWTH < 24 HOURS Performed at [facility] [address], [place], [place] [address]    Report Status PENDING   Vitamin B12     Status: Abnormal   Collection Time: 01/03/18 12:13 PM  Result Value Ref Range   Vitamin B-12 96 (L) 180 - 914 pg/mL    Comment: (NOTE) This assay is not validated for testing neonatal or myeloproliferative syndrome specimens for Vitamin B12 levels. Performed at [facility], [place] [address], [place], [place] [address]   Folate     Status: None   Collection Time: 01/03/18 12:13 PM  Result Value Ref Range   Folate 6.0 >5.9 ng/mL    Comment: Performed at [facility] [address] [place], [place] [address]  Iron and TIBC     Status: Abnormal   Collection Time: 01/03/18 12:13 PM  Result Value Ref Range   Iron 20 (L) 45 - 182 ug/dL   TIBC 185 (L) 250 - 450 ug/dL   Saturation Ratios 11 (L) 17.9 - 39.5 %   UIBC 165 ug/dL    Comment: Performed at [facility] [address], [place], [place] [address]  Ferritin  Status: Abnormal   Collection Time: 01/03/18 12:13 PM  Result Value Ref Range   Ferritin 695 (H) 24 - 336 ng/mL    Comment: Performed at [facility], [place] [address] [place], [place] [address]  Reticulocytes     Status: Abnormal   Collection Time: 01/03/18 12:13 PM  Result Value Ref Range   Retic Ct Pct 0.7 0.4 - 3.1 %   RBC. 2.60 (L) 4.22 - 5.81 MIL/uL   Retic Count, Absolute 18.2 (L) 19.0 - 186.0 K/uL    Comment: Performed at [facility] [address], [place], [place] [address]  Prepare RBC     Status: None   Collection Time: 01/03/18 12:13 PM  Result Value Ref Range   Order Confirmation      ORDER PROCESSED BY BLOOD BANK Performed at [facility], [place] [address], [place], [place] [address]   Type and screen [place]     Status: None (Preliminary result)   Collection Time: 01/03/18 12:22 PM  Result Value Ref Range   ABO/RH(D) O POS    Antibody  Screen NEG    Sample Expiration 01/06/2018    Unit Number [id number]    Blood Component Type RED CELLS,LR    Unit division 00    Status of Unit ISSUED,FINAL    Transfusion Status OK TO TRANSFUSE    Crossmatch Result      Compatible Performed at [facility], [place] [address], [place], [place] [address]    Unit Number [id number]    Blood Component Type RED CELLS,LR    Unit division 00    Status of Unit ALLOCATED    Transfusion Status OK TO TRANSFUSE    Crossmatch Result Compatible   ABO/Rh     Status: None   Collection Time: 01/03/18 12:22 PM  Result Value Ref Range   ABO/RH(D)      O POS Performed at [facility], [place] [address], [place], [place] [address]   I-Stat CG4 Lactic Acid, ED     Status: None   Collection Time: 01/03/18  2:50 PM  Result Value Ref Range   Lactic Acid, Venous 1.54 0.5 - 1.9 mmol/L  Glucose, capillary     Status: Abnormal   Collection Time: 01/03/18  5:06 PM  Result Value Ref Range   Glucose-Capillary 132 (H) 70 - 99 mg/dL  Basic metabolic panel     Status: Abnormal   Collection Time: 01/03/18  5:08 PM  Result Value Ref Range   Sodium 137 135 - 145 mmol/L   Potassium 5.1 3.5 - 5.1 mmol/L   Chloride 100 98 - 111 mmol/L    Comment: Please note change in reference range.   CO2 23 22 - 32 mmol/L   Glucose, Bld 124 (H) 70 - 99 mg/dL    Comment: Please note change in reference range.   BUN 56 (H) 8 - 23 mg/dL    Comment: Please note change in reference range.   Creatinine, Ser 6.30 (H) 0.61 - 1.24 mg/dL   Calcium 8.6 (L) 8.9 - 10.3 mg/dL   GFR calc non Af Amer 8 (L) >60 mL/min   GFR calc Af Amer 10 (L) >60 mL/min    Comment: (NOTE) The eGFR has been calculated using the CKD EPI equation. This calculation has not been validated in all clinical situations. eGFR's persistently <60 mL/min signify possible Chronic Kidney Disease.    Anion gap 14 5 - 15  Comment: Performed at [facility], [place] [address], [place], [place]  [address]  MRSA PCR Screening     Status: None   Collection Time: 01/03/18  5:19 PM  Result Value Ref Range   MRSA by PCR NEGATIVE NEGATIVE    Comment:        The GeneXpert MRSA Assay (FDA approved for NASAL specimens only), is one component of a comprehensive MRSA colonization surveillance program. It is not intended to diagnose MRSA infection nor to guide or monitor treatment for MRSA infections. Performed at [facility], [place] [address] [place], [place] [address]   Glucose, capillary     Status: Abnormal   Collection Time: 01/03/18  5:28 PM  Result Value Ref Range   Glucose-Capillary 129 (H) 70 - 99 mg/dL  Glucose, capillary     Status: Abnormal   Collection Time: 01/03/18  9:48 PM  Result Value Ref Range   Glucose-Capillary 115 (H) 70 - 99 mg/dL  Surgical pcr screen     Status: Abnormal   Collection Time: 01/03/18 11:11 PM  Result Value Ref Range   MRSA, PCR NEGATIVE NEGATIVE   Staphylococcus aureus POSITIVE (A) NEGATIVE    Comment: (NOTE) The Xpert SA Assay (FDA approved for NASAL specimens in patients 58 years of age and older), is one component of a comprehensive surveillance program. It is not intended to diagnose infection nor to guide or monitor treatment. Performed at [facility], [place] [address] [place], [place] [address]   Basic metabolic panel     Status: Abnormal   Collection Time: 01/03/18 11:48 PM  Result Value Ref Range   Sodium 134 (L) 135 - 145 mmol/L   Potassium 4.9 3.5 - 5.1 mmol/L   Chloride 103 98 - 111 mmol/L    Comment: Please note change in reference range.   CO2 21 (L) 22 - 32 mmol/L   Glucose, Bld 126 (H) 70 - 99 mg/dL    Comment: Please note change in reference range.   BUN 57 (H) 8 - 23 mg/dL    Comment: Please note change in reference range.   Creatinine, Ser 6.54 (H) 0.61 - 1.24 mg/dL   Calcium 8.4 (L) 8.9 - 10.3 mg/dL   GFR calc non Af Amer 8 (L) >60 mL/min   GFR calc Af Amer 9 (L) >60 mL/min    Comment: (NOTE) The  eGFR has been calculated using the CKD EPI equation. This calculation has not been validated in all clinical situations. eGFR's persistently <60 mL/min signify possible Chronic Kidney Disease.    Anion gap 10 5 - 15    Comment: Performed at [facility] [address], [place], [place] [address]  CBC     Status: Abnormal   Collection Time: 01/03/18 11:48 PM  Result Value Ref Range   WBC 27.9 (H) 4.0 - 10.5 K/uL    Comment: REPEATED TO VERIFY   RBC 2.67 (L) 4.22 - 5.81 MIL/uL   Hemoglobin 7.2 (L) 13.0 - 17.0 g/dL   HCT 23.1 (L) 39.0 - 52.0 %   MCV 86.5 78.0 - 100.0 fL   MCH 27.0 26.0 - 34.0 pg   MCHC 31.2 30.0 - 36.0 g/dL   RDW 14.5 11.5 - 15.5 %   Platelets 353 150 - 400 K/uL    Comment: Performed at [facility], [place] [address], [place], [place] [address]  CBC     Status: Abnormal   Collection Time: 01/04/18  6:49 AM  Result Value Ref Range   WBC 32.9 (H) 4.0 - 10.5 K/uL   RBC 3.17 (L) 4.22 - 5.81 MIL/uL   Hemoglobin 8.5 (L) 13.0 - 17.0 g/dL   HCT 27.8 (L) 39.0 - 52.0 %   MCV 87.7 78.0 - 100.0 fL   MCH 26.8 26.0 - 34.0 pg   MCHC 30.6 30.0 - 36.0 g/dL   RDW 14.6 11.5 - 15.5 %   Platelets 417 (H) 150 - 400 K/uL    Comment: Performed at [facility] [address], [place], [place] [address]  Basic metabolic panel     Status: Abnormal   Collection Time: 01/04/18  6:49 AM  Result Value Ref Range   Sodium 134 (L) 135 - 145 mmol/L   Potassium 5.5 (H) 3.5 - 5.1 mmol/L   Chloride 102 98 - 111 mmol/L    Comment: Please note change in reference range.   CO2 18 (L) 22 - 32 mmol/L   Glucose, Bld 123 (H) 70 - 99 mg/dL    Comment: Please note change in reference range.   BUN 58 (H) 8 - 23 mg/dL    Comment: Please note change in reference range.   Creatinine, Ser 6.64 (H) 0.61 - 1.24 mg/dL   Calcium 8.6 (L) 8.9 - 10.3 mg/dL   GFR calc non Af Amer 8 (L) >60 mL/min   GFR calc Af Amer 9 (L) >60 mL/min    Comment: (NOTE) The eGFR has been calculated using the CKD  EPI equation. This calculation has not been validated in all clinical situations. eGFR's persistently <60 mL/min signify possible Chronic Kidney Disease.    Anion gap 14 5 - 15    Comment: Performed at [facility] [address], [place], [place] [address]  Glucose, capillary     Status: Abnormal   Collection Time: 01/04/18  7:54 AM  Result Value Ref Range   Glucose-Capillary 122 (H) 70 - 99 mg/dL  Vancomycin, random     Status: None   Collection Time: 01/04/18  8:54 AM  Result Value Ref Range   Vancomycin Rm 22     Comment:        Random Vancomycin therapeutic range is dependent on dosage and time of specimen collection. A peak range is 20.0-40.0 ug/mL A trough range is 5.0-15.0 ug/mL        Performed at [facility] [address], [place], [place] [address]   Glucose, capillary     Status: Abnormal   Collection Time: 01/04/18 11:49 AM  Result Value Ref Range   Glucose-Capillary 107 (H) 70 - 99 mg/dL  Glucose, capillary     Status: Abnormal   Collection Time: 01/04/18  1:15 PM  Result Value Ref Range   Glucose-Capillary 117 (H) 70 - 99 mg/dL     ROS:  Pertinent items noted in HPI and remainder of comprehensive ROS otherwise negative.  Physical Exam: Vitals:   01/04/18 1500 01/04/18 1600  BP: (!) 109/48 (!) 105/54  Pulse:    Resp: 18 16  Temp: 98.6 F (37 C) 98.7 F (37.1 C)  SpO2: 93% 97%     General exam: Appears calm and comfortable  Respiratory system: Clear to auscultation. Respiratory effort normal. No wheezing or crackle Cardiovascular system: S1 & S2 heard, RRR.  No pedal edema. Gastrointestinal system: Abdomen is nondistended, soft and nontender. Normal bowel sounds heard. Central nervous system: Alert and oriented. No focal neurological deficits. Extremities: Symmetric  5 x 5 power. Skin: No rashes, lesions or ulcers Psychiatry: Judgement and insight appear normal. Mood & affect appropriate.   Assessment/Plan:  #Acute kidney injury  on CKD stage III likely ATN in the setting of sepsis/NSAIDs, lisinopril, HCTZ use.  Baseline serum creatinine level around 1-1.3.  Serum creatinine level of 6.64 today with mild hyperkalemia. Ultrasound of kidneys with normal echogenicity and no obstruction. Check urinalysis, urine electrolytes, bladder scan, strict ins and out. Monitor BMP, avoid nephrotoxins Vanc level 22 Given acidosis, I changed IV fluid with sodium bicarbonate.  We will reduce the dose of Neurontin because of low GFR.  #Hyperkalemia: Continue sod bicarb IVF.  Order a dose of Veltassa.  Monitor lab.  #Hypertension blood pressure on the lower side.  Currently on metoprolol.  Avoid hypotensive episode.  #Left foot gangrene status post amputation by Dr. Sharol Given today.  Thank you for the consult.  We will follow with you.  Angeleen Horney Tanna Furry 01/04/2018, 4:37 PM  [facility].

## 2018-01-04 NOTE — H&P (Signed)
Joel Hebert is an 62 y.o. male.   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  Patient has been using a nitroglycerin patch, Trental, with daily wound dressing changes.  Patient has had progressive wound dehiscence and dry gangrenous changes.    Past Medical History:  Diagnosis Date  . Coronary artery disease   . High cholesterol   . Hypertension   . MI (myocardial infarction) ([facility]) 08/13/2014; 01/2016- 03/2016 X 3  . PAD (peripheral artery disease) ([facility])   . Peripheral vascular disease ([facility])   . Type II diabetes mellitus ([facility])     Past Surgical History:  Procedure Laterality Date  . ABDOMINAL AORTOGRAM W/LOWER EXTREMITY  08/14/2017  . ABDOMINAL AORTOGRAM W/LOWER EXTREMITY N/A 08/14/2017   Procedure: ABDOMINAL AORTOGRAM W/LOWER EXTREMITY;  Surgeon: Serafina Mitchell, MD;  Location: [facility];  Service: Cardiovascular;  Laterality: N/A;  . ABDOMINAL AORTOGRAM W/LOWER EXTREMITY N/A 09/05/2017   Procedure: ABDOMINAL AORTOGRAM W/LOWER EXTREMITY;  Surgeon: Waynetta Sandy, MD;  Location: [facility];  Service: Cardiovascular;  Laterality: N/A;  . AMPUTATION Left 10/09/2017   Procedure: AMPUTATION DIGIT LEFT FIFTH TOE;  Surgeon: Waynetta Sandy, MD;  Location: [facility];  Service: Vascular;  Laterality: Left;  . AMPUTATION Left 12/14/2017   Procedure: LEFT TRANSMETATARSAL AMPUTATION;  Surgeon: Newt Minion, MD;  Location: [facility];  Service: Orthopedics;  Laterality: Left;  . AMPUTATION TOE Left 10/09/2017   left foot 5th toe  . CORONARY ANGIOPLASTY WITH STENT PLACEMENT  01/2016; 03/2016   "1 + 1" (08/14/2017)  . LOWER EXTREMITY ANGIOGRAPHY N/A 08/20/2017   Procedure: LOWER EXTREMITY ANGIOGRAPHY-rt leg;  Surgeon: Elam Dutch, MD;  Location: [facility];  Service: Cardiovascular;  Laterality: N/A;  . PERIPHERAL VASCULAR  ATHERECTOMY  08/14/2017   Procedure: PERIPHERAL VASCULAR ATHERECTOMY;  Surgeon: Serafina Mitchell, MD;  Location: [facility];  Service: Cardiovascular;;  SFA   . PERIPHERAL VASCULAR ATHERECTOMY Left 09/05/2017   Procedure: PERIPHERAL VASCULAR ATHERECTOMY;  Surgeon: Waynetta Sandy, MD;  Location: [facility];  Service: Cardiovascular;  Laterality: Left;  Tibioperoneal and posterior tibial  . PERIPHERAL VASCULAR BALLOON ANGIOPLASTY  08/14/2017   Procedure: PERIPHERAL VASCULAR BALLOON ANGIOPLASTY;  Surgeon: Serafina Mitchell, MD;  Location: [facility];  Service: Cardiovascular;;  peroneal  . PERIPHERAL VASCULAR BALLOON ANGIOPLASTY  08/20/2017   Procedure: PERIPHERAL VASCULAR BALLOON ANGIOPLASTY;  Surgeon: Elam Dutch, MD;  Location: [facility];  Service: Cardiovascular;;  . PERIPHERAL VASCULAR INTERVENTION  08/14/2017   Procedure: PERIPHERAL VASCULAR INTERVENTION;  Surgeon: Serafina Mitchell, MD;  Location: [facility];  Service: Cardiovascular;;  SFA stent  . TONSILLECTOMY    . WOUND DEBRIDEMENT Left 11/06/2017   Procedure: DEBRIDEMENT WOUND TOE BED OF FIFTH TOE LEFT FOOT;  Surgeon: Waynetta Sandy, MD;  Location: [facility];  Service: Vascular;  Laterality: Left;    Family History  Problem Relation Age of Onset  . Heart failure Brother   . Heart disease Brother   . Kidney disease Mother   . Heart disease Father    Social History:  reports that he has been smoking cigarettes.  He started smoking about 43 years ago. He has a 10.75 pack-year smoking history. He has never used smokeless tobacco. He reports that he drank alcohol. He reports that he does not use drugs.  Allergies:  No Known Allergies  Medications Prior to Admission  Medication Sig Dispense Refill  . acetaminophen (TYLENOL) 500 MG tablet Take 500 mg by mouth every 6 (six) hours as needed for mild pain or headache.     Marland Kitchen aspirin EC 81 MG tablet Take 81 mg by mouth 2 (two) times  daily.     Marland Kitchen atorvastatin (LIPITOR) 20 MG tablet Take 20 mg by mouth daily.    . clopidogrel (PLAVIX) 75 MG tablet Take 1 tablet (75 mg total) by mouth daily with breakfast. 30 tablet 11  . diclofenac (VOLTAREN) 50 MG EC tablet Take 1 tablet (50 mg total) by mouth 2 (two) times daily. 20 tablet 0  . gabapentin (NEURONTIN) 300 MG capsule Take 1 capsule (300 mg total) by mouth 2 (two) times daily. 30 capsule 0  . insulin aspart (NOVOLOG) 100 UNIT/ML injection Inject 0-15 Units into the skin 3 (three) times daily with meals. 10 mL 11  . lisinopril-hydrochlorothiazide (PRINZIDE,ZESTORETIC) 20-25 MG tablet Take 1 tablet by mouth daily.   2  . metFORMIN (GLUCOPHAGE) 850 MG tablet Take 850 mg by mouth 2 (two) times daily with a meal.    . methocarbamol (ROBAXIN) 500 MG tablet Take 1 tablet (500 mg total) by mouth every 8 (eight) hours as needed for muscle spasms. 30 tablet 0  . metoprolol tartrate (LOPRESSOR) 100 MG tablet Take 100 mg by mouth 2 (two) times daily.    . nitroGLYCERIN (NITRODUR - DOSED IN MG/24 HR) 0.2 mg/hr patch Place 1 patch (0.2 mg total) onto the skin daily. 30 patch 12  . nitroGLYCERIN (NITROSTAT) 0.4 MG SL tablet Place 0.4 mg under the tongue every 5 (five) minutes as needed for chest pain.    Marland Kitchen oxyCODONE-acetaminophen (PERCOCET) 10-325 MG tablet Take 1 tablet by mouth every 4 (four) hours as needed for pain. 30 tablet 0  . pentoxifylline (TRENTAL) 400 MG CR tablet Take 1 tablet (400 mg total) by mouth 3 (three) times daily with meals. 90 tablet 3  . rosuvastatin (CRESTOR) 20 MG tablet Take 20 mg by mouth every evening.     . methocarbamol (ROBAXIN) 500 MG tablet Take 1 tablet (500 mg total) by mouth every 8 (eight) hours as needed for muscle spasms. (Patient not taking: Reported on 01/03/2018) 30 tablet 0  . oxyCODONE-acetaminophen (PERCOCET) 10-325 MG tablet Take 1 tablet by mouth every 8 (eight) hours as needed for pain. (Patient not taking: Reported on 01/03/2018) 20 tablet 0     Results for orders placed or performed during the hospital encounter of 01/03/18 (from the past 48 hour(s))  CBC with Differential     Status: Abnormal   Collection Time: 01/03/18 10:56 AM  Result Value Ref Range   WBC 36.2 (H) 4.0 - 10.5 K/uL   RBC 2.53 (L) 4.22 - 5.81 MIL/uL   Hemoglobin 6.8 (LL) 13.0 - 17.0 g/dL    Comment: REPEATED TO VERIFY CRITICAL RESULT CALLED TO, READ BACK BY AND VERIFIED WITH: Mali GROSE,RN AT 1139 01/03/18 BY ZBEECH.    HCT 21.9 (L) 39.0 - 52.0 %   MCV 86.6 78.0 - 100.0 fL   MCH 26.9 26.0 - 34.0 pg   MCHC 31.1 30.0 - 36.0 g/dL   RDW 14.2 11.5 - 15.5 %   Platelets 452 (H) 150 - 400 K/uL   Neutrophils Relative % 88 %   Lymphocytes Relative 5 %   Monocytes Relative 6 %   Eosinophils Relative 1 %   Basophils Relative 0 %  Neutro Abs 31.8 (H) 1.7 - 7.7 K/uL   Lymphs Abs 1.8 0.7 - 4.0 K/uL   Monocytes Absolute 2.2 (H) 0.1 - 1.0 K/uL   Eosinophils Absolute 0.4 0.0 - 0.7 K/uL   Basophils Absolute 0.0 0.0 - 0.1 K/uL    Comment: Performed at [facility] [address] [place], [place] [address]  Comprehensive metabolic panel     Status: Abnormal   Collection Time: 01/03/18 10:56 AM  Result Value Ref Range   Sodium 134 (L) 135 - 145 mmol/L   Potassium 5.2 (H) 3.5 - 5.1 mmol/L   Chloride 98 98 - 111 mmol/L    Comment: Please note change in reference range.   CO2 22 22 - 32 mmol/L   Glucose, Bld 154 (H) 70 - 99 mg/dL    Comment: Please note change in reference range.   BUN 56 (H) 8 - 23 mg/dL    Comment: Please note change in reference range.   Creatinine, Ser 6.29 (H) 0.61 - 1.24 mg/dL   Calcium 9.3 8.9 - 10.3 mg/dL   Total Protein 6.6 6.5 - 8.1 g/dL   Albumin 2.2 (L) 3.5 - 5.0 g/dL   AST 39 15 - 41 U/L   ALT 25 0 - 44 U/L    Comment: Please note change in reference range.   Alkaline Phosphatase 195 (H) 38 - 126 U/L   Total Bilirubin 0.3 0.3 - 1.2 mg/dL   GFR calc non Af Amer 8 (L) >60 mL/min   GFR calc Af Amer 10 (L) >60 mL/min     Comment: (NOTE) The eGFR has been calculated using the CKD EPI equation. This calculation has not been validated in all clinical situations. eGFR's persistently <60 mL/min signify possible Chronic Kidney Disease.    Anion gap 14 5 - 15    Comment: Performed at [facility] [address], [place], [place] [address]  I-Stat CG4 Lactic Acid, ED     Status: Abnormal   Collection Time: 01/03/18 11:01 AM  Result Value Ref Range   Lactic Acid, Venous 3.24 (HH) 0.5 - 1.9 mmol/L   Comment NOTIFIED PHYSICIAN   Blood Culture (routine x 2)     Status: None (Preliminary result)   Collection Time: 01/03/18 12:06 PM  Result Value Ref Range   Specimen Description BLOOD RIGHT ANTECUBITAL    Special Requests      Blood Culture results may not be optimal due to an excessive volume of blood received in culture bottles   Culture PENDING    Report Status PENDING   Vitamin B12     Status: Abnormal   Collection Time: 01/03/18 12:13 PM  Result Value Ref Range   Vitamin B-12 96 (L) 180 - 914 pg/mL    Comment: (NOTE) This assay is not validated for testing neonatal or myeloproliferative syndrome specimens for Vitamin B12 levels. Performed at [facility], [place] [address], [place], [place] [address]   Folate     Status: None   Collection Time: 01/03/18 12:13 PM  Result Value Ref Range   Folate 6.0 >5.9 ng/mL    Comment: Performed at [facility] [address], [place], [place] [address]  Iron and TIBC     Status: Abnormal   Collection Time: 01/03/18 12:13 PM  Result Value Ref Range   Iron 20 (L) 45 - 182 ug/dL   TIBC 185 (L) 250 - 450 ug/dL   Saturation Ratios 11 (L) 17.9 -  39.5 %   UIBC 165 ug/dL    Comment: Performed at [facility], [place] [address], [place], [place] [address]  Ferritin     Status: Abnormal   Collection Time: 01/03/18 12:13 PM  Result Value Ref Range   Ferritin 695 (H) 24 - 336 ng/mL    Comment: Performed at [facility], [place] [address]  [place], [place] [address]  Reticulocytes     Status: Abnormal   Collection Time: 01/03/18 12:13 PM  Result Value Ref Range   Retic Ct Pct 0.7 0.4 - 3.1 %   RBC. 2.60 (L) 4.22 - 5.81 MIL/uL   Retic Count, Absolute 18.2 (L) 19.0 - 186.0 K/uL    Comment: Performed at [facility] [address] [place], [place] [address]  Prepare RBC     Status: None   Collection Time: 01/03/18 12:13 PM  Result Value Ref Range   Order Confirmation      ORDER PROCESSED BY BLOOD BANK Performed at [facility], [place] [address], [place], [place] [address]   Type and screen [place]     Status: None (Preliminary result)   Collection Time: 01/03/18 12:22 PM  Result Value Ref Range   ABO/RH(D) O POS    Antibody Screen NEG    Sample Expiration 01/06/2018    Unit Number [id number]    Blood Component Type RED CELLS,LR    Unit division 00    Status of Unit ISSUED    Transfusion Status OK TO TRANSFUSE    Crossmatch Result      Compatible Performed at [facility], [place] [address] [place], [place] [address]    Unit Number [id number]    Blood Component Type RED CELLS,LR    Unit division 00    Status of Unit ALLOCATED    Transfusion Status OK TO TRANSFUSE    Crossmatch Result Compatible   ABO/Rh     Status: None   Collection Time: 01/03/18 12:22 PM  Result Value Ref Range   ABO/RH(D)      O POS Performed at [facility], [place] [address], [place], [place] [address]   I-Stat CG4 Lactic Acid, ED     Status: None   Collection Time: 01/03/18  2:50 PM  Result Value Ref Range   Lactic Acid, Venous 1.54 0.5 - 1.9 mmol/L  Glucose, capillary     Status: Abnormal   Collection Time: 01/03/18  5:06 PM  Result Value Ref Range   Glucose-Capillary 132 (H) 70 - 99 mg/dL  Basic metabolic panel     Status: Abnormal   Collection Time: 01/03/18  5:08 PM  Result Value Ref Range   Sodium 137 135 - 145 mmol/L   Potassium 5.1 3.5 - 5.1 mmol/L   Chloride 100 98 - 111 mmol/L     Comment: Please note change in reference range.   CO2 23 22 - 32 mmol/L   Glucose, Bld 124 (H) 70 - 99 mg/dL    Comment: Please note change in reference range.   BUN 56 (H) 8 - 23 mg/dL    Comment: Please note change in reference range.   Creatinine, Ser 6.30 (H) 0.61 - 1.24 mg/dL   Calcium 8.6 (L) 8.9 - 10.3 mg/dL   GFR calc non Af Amer 8 (L) >60 mL/min   GFR calc Af Amer 10 (L) >60 mL/min    Comment: (NOTE) The eGFR has been calculated using the  CKD EPI equation. This calculation has not been validated in all clinical situations. eGFR's persistently <60 mL/min signify possible Chronic Kidney Disease.    Anion gap 14 5 - 15    Comment: Performed at [facility] [address], [place], [place] [address]  MRSA PCR Screening     Status: None   Collection Time: 01/03/18  5:19 PM  Result Value Ref Range   MRSA by PCR NEGATIVE NEGATIVE    Comment:        The GeneXpert MRSA Assay (FDA approved for NASAL specimens only), is one component of a comprehensive MRSA colonization surveillance program. It is not intended to diagnose MRSA infection nor to guide or monitor treatment for MRSA infections. Performed at [facility], [place] [address], [place], [place] [address]   Glucose, capillary     Status: Abnormal   Collection Time: 01/03/18  5:28 PM  Result Value Ref Range   Glucose-Capillary 129 (H) 70 - 99 mg/dL  Glucose, capillary     Status: Abnormal   Collection Time: 01/03/18  9:48 PM  Result Value Ref Range   Glucose-Capillary 115 (H) 70 - 99 mg/dL  Surgical pcr screen     Status: Abnormal   Collection Time: 01/03/18 11:11 PM  Result Value Ref Range   MRSA, PCR NEGATIVE NEGATIVE   Staphylococcus aureus POSITIVE (A) NEGATIVE    Comment: (NOTE) The Xpert SA Assay (FDA approved for NASAL specimens in patients 23 years of age and older), is one component of a comprehensive surveillance program. It is not intended to diagnose infection nor to guide or monitor  treatment. Performed at [facility], [place] [address], [place], [place] [address]   Basic metabolic panel     Status: Abnormal   Collection Time: 01/03/18 11:48 PM  Result Value Ref Range   Sodium 134 (L) 135 - 145 mmol/L   Potassium 4.9 3.5 - 5.1 mmol/L   Chloride 103 98 - 111 mmol/L    Comment: Please note change in reference range.   CO2 21 (L) 22 - 32 mmol/L   Glucose, Bld 126 (H) 70 - 99 mg/dL    Comment: Please note change in reference range.   BUN 57 (H) 8 - 23 mg/dL    Comment: Please note change in reference range.   Creatinine, Ser 6.54 (H) 0.61 - 1.24 mg/dL   Calcium 8.4 (L) 8.9 - 10.3 mg/dL   GFR calc non Af Amer 8 (L) >60 mL/min   GFR calc Af Amer 9 (L) >60 mL/min    Comment: (NOTE) The eGFR has been calculated using the CKD EPI equation. This calculation has not been validated in all clinical situations. eGFR's persistently <60 mL/min signify possible Chronic Kidney Disease.    Anion gap 10 5 - 15    Comment: Performed at [facility] [address], [place], [place] [address]  CBC     Status: Abnormal   Collection Time: 01/03/18 11:48 PM  Result Value Ref Range   WBC 27.9 (H) 4.0 - 10.5 K/uL    Comment: REPEATED TO VERIFY   RBC 2.67 (L) 4.22 - 5.81 MIL/uL   Hemoglobin 7.2 (L) 13.0 - 17.0 g/dL   HCT 23.1 (L) 39.0 - 52.0 %   MCV 86.5 78.0 - 100.0 fL   MCH 27.0 26.0 - 34.0 pg   MCHC 31.2 30.0 - 36.0 g/dL   RDW 14.5 11.5 - 15.5 %   Platelets 353 150 -  400 K/uL    Comment: Performed at [facility], [place] [address] [place], [place] [address]   Dg Chest 2 View  Result Date: 01/03/2018 CLINICAL DATA:  Shortness of breath. EXAM: CHEST - 2 VIEW COMPARISON:  No prior. FINDINGS: Mediastinum and hilar structures normal. Nodular opacity noted over the left upper lung. Repeat PA and lateral chest x-ray suggested. If nodular density remains chest CT can be obtained for further evaluation. Low lung volumes with mild basilar atelectasis. Cardiomegaly with  normal pulmonary vascularity. No acute bony abnormality. IMPRESSION: 1. Questionable small nodular density left upper lung. Repeat PA lateral chest x-ray suggested. Nodular density remains chest CT can be obtained to further evaluate. 2.  Mild bibasilar subsegmental atelectasis. 3.  Cardiomegaly.  No pulmonary venous congestion. Electronically Signed   By: Marcello Moores  Register   On: 01/03/2018 11:41   US Renal  Result Date: 01/03/2018 CLINICAL DATA:  Acute renal failure 1 day. EXAM: RENAL / URINARY TRACT ULTRASOUND COMPLETE COMPARISON:  None. FINDINGS: Right Kidney: Length: 12.3 cm. Echogenicity within normal limits. No mass or hydronephrosis visualized. Left Kidney: Length: 12.1 cm. Echogenicity within normal limits. No mass or hydronephrosis visualized. Bladder: Appears normal for degree of bladder distention. IMPRESSION: Normal renal ultrasound. Electronically Signed   By: Marin Olp M.D.   On: 01/03/2018 21:42    Review of Systems  All other systems reviewed and are negative.   Blood pressure (!) 168/82, pulse (!) 113, temperature 99.4 F (37.4 C), temperature source Oral, resp. rate 17, height _0  (1.727 m), weight 206 lb 9.1 oz (93.7 kg), SpO2 94 %. Physical Exam  Patient is alert, oriented, no adenopathy, well-dressed, normal affect, normal respiratory effort. Patient has good hair growth to the mid tibia.  He has venous stasis swelling in both lower extremities.  Patient has had progressive wound dehiscence of the left transmetatarsal amputation with ischemic gangrenous changes of the dorsal flap with wound dehiscence.  There is no ascending cellulitis.   Assessment/Plan 1. Dehiscence of amputation stump (HCC)     Plan: Discussed with patient with progressive ischemic changes to the transmetatarsal amputation and there are not any further foot salvage interventions optional.  I recommend proceeding with a left transtibial amputation.  Anticipate hospitalization for 3 days with return  to skilled nursing.     Newt Minion, MD 01/04/2018, 6:38 AM

## 2018-01-04 NOTE — Anesthesia Postprocedure Evaluation (Signed)
Anesthesia Post Note  Patient: Joel Hebert  Procedure(s) Performed: LEFT BELOW KNEE AMPUTATION (Left Leg Lower)     Patient location during evaluation: PACU Anesthesia Type: Regional Level of consciousness: awake and alert Pain management: pain level controlled Vital Signs Assessment: post-procedure vital signs reviewed and stable Respiratory status: spontaneous breathing Cardiovascular status: stable Anesthetic complications: no    Last Vitals:  Vitals:   01/04/18 1410 01/04/18 1500  BP: (!) 101/51 (!) 109/48  Pulse: 93   Resp: 12 18  Temp:  37 C  SpO2: 92% 93%    Last Pain:  Vitals:   01/04/18 1500  TempSrc: Axillary  PainSc:                  [name]

## 2018-01-04 NOTE — Discharge Summary (Addendum)
Name: Joel Hebert MRN: [id number] DOB: 1955/08/26 62 y.o. PCP: Patient, No Pcp Per  Date of Admission: 01/03/2018  9:28 AM Date of Discharge: 01/25/2018 Attending Physician: Joel Hebert  Discharge Diagnosis: 1. Sepsis 2. PAD s/p tibial amputation for prior nonhealing transmetatarsal amputation 3. Acute Renal Failure 4. Acute on chronic anemia  Discharge Medications: Allergies as of 01/23/2018   No Known Allergies     Medication List    STOP taking these medications   atorvastatin 20 MG tablet Commonly known as:  LIPITOR   diclofenac 50 MG EC tablet Commonly known as:  VOLTAREN   lisinopril-hydrochlorothiazide 20-25 MG tablet Commonly known as:  PRINZIDE,ZESTORETIC   pentoxifylline 400 MG CR tablet Commonly known as:  TRENTAL     TAKE these medications   acetaminophen 500 MG tablet Commonly known as:  TYLENOL Take 500 mg by mouth every 6 (six) hours as needed for mild pain or headache.   amLODipine 10 MG tablet Commonly known as:  NORVASC Take 1 tablet (10 mg total) by mouth at bedtime.   aspirin EC 81 MG tablet Take 81 mg by mouth 2 (two) times daily.   clopidogrel 75 MG tablet Commonly known as:  PLAVIX Take 1 tablet (75 mg total) by mouth daily with breakfast.   cyanocobalamin 1000 MCG tablet Take 1 tablet (1,000 mcg total) by mouth daily. Start taking on:  01/24/2018   feeding supplement (NEPRO CARB STEADY) Liqd Take 237 mLs by mouth daily.   ferrous sulfate 325 (65 FE) MG tablet Take 1 tablet (325 mg total) by mouth daily with breakfast. Start taking on:  01/24/2018   gabapentin 300 MG capsule Commonly known as:  NEURONTIN Take 1 capsule (300 mg total) by mouth at bedtime. What changed:  when to take this   hydrALAZINE 100 MG tablet Commonly known as:  APRESOLINE Take 1 tablet (100 mg total) by mouth every 8 (eight) hours.   hydrocortisone cream 1 % Apply 1 application topically 3 (three) times daily as needed for itching  (minor skin irritation).   insulin aspart 100 UNIT/ML injection Commonly known as:  novoLOG Inject 0-15 Units into the skin 3 (three) times daily with meals.   metFORMIN 850 MG tablet Commonly known as:  GLUCOPHAGE Take 850 mg by mouth 2 (two) times daily with a meal.   methocarbamol 500 MG tablet Commonly known as:  ROBAXIN Take 1 tablet (500 mg total) by mouth every 8 (eight) hours as needed for muscle spasms. What changed:  Another medication with the same name was removed. Continue taking this medication, and follow the directions you see here.   metoprolol tartrate 100 MG tablet Commonly known as:  LOPRESSOR Take 100 mg by mouth 2 (two) times daily.   multivitamin Tabs tablet Take 1 tablet by mouth at bedtime.   nitroGLYCERIN 0.4 MG SL tablet Commonly known as:  NITROSTAT Place 0.4 mg under the tongue every 5 (five) minutes as needed for chest pain. What changed:  Another medication with the same name was removed. Continue taking this medication, and follow the directions you see here.   oxyCODONE-acetaminophen 10-325 MG tablet Commonly known as:  PERCOCET Take 1 tablet by mouth every 4 (four) hours as needed for pain. What changed:  Another medication with the same name was removed. Continue taking this medication, and follow the directions you see here.   polyethylene glycol packet Commonly known as:  MIRALAX / GLYCOLAX Take 17 g by mouth daily as needed for mild  constipation.   rosuvastatin 20 MG tablet Commonly known as:  CRESTOR Take 20 mg by mouth every evening.   senna-docusate 8.6-50 MG tablet Commonly known as:  Senokot-S Take 1 tablet by mouth at bedtime as needed for mild constipation.       Disposition and follow-up:   [name] [facility] St[facility].  At the hospital follow up visit please address:  1. 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 Patient  also has a dry gangren at his right toe that has been with no change during hospitalization. Please consider any change or progress to wet gangrene or infection.  3. Acute Renal Failure and uremia. Resolved post HDx4. Cr now stabilized at 4-5. monitor BMP twice weekly and follow up with nephrologist in 2 weeks. Patient should not take NSAID or other nephrotoxic drugs.  4. Acute on chronic anemia: Patient has been asymptomatic. Post transfusion x 4 u pRBC pre and post surgery. Hb stable at 8.2 at time of discharge. On PO iron. FOBT +. Otherwise asymptomatic. Refer to GI for work up. Please follow up with CBC weekly  2.  Labs / imaging needed at time of follow-up: CBC weekly BMP twice weekly   3.  Pending labs/ test needing follow-up: None   Follow-up Appointments: Follow-up Information    [facility]. Go on 01/23/2018.   Specialty:  Internal Medicine Why:  1pm, post hospital follow up Contact information: [address] [place] [id number] [phone]       Joel Hebert, Joel V, MD Follow up in 1 week(s).   Specialty:  Orthopedic Surgery Contact information: [address] [place] [address] [phone]        [facility] Go on 02/13/2018.   Why:  At 3:00 PM Contact information: [address] [place] [address] [phone]           Hospital Course by problem list:  1- Wet Gangrene of Left Transmetatarsal Stump w/ secondary sepsis: Joel Hebert is a 62 yo M with a pmhx ofHTN, HLD, Type II DM, CAD, PAD, andrecentleft foot transmetatarsal amputation came from SNF with fever and sepsis,of the wound gangren and dehiscence. Patient underwent left transtibial amputation of left lower extremity. Wound Vac was removed on 07/16 and should follow up with Dr. Lajoyce Hebert next week.  2. Acute Renal Failure and uremia. Possibly due to sepsis, hypovolemia and NSAID. Patient developed uremia with encephalopathy  and underwent HDx4, regained his normal mental status. Lab stabilized as: BUN 45 and Cr 5.3 at time of discharge. No uremia symptoms at time of discharge. Stopped NSAID and avoided ACE Inh, ARB and nephrotoxic drugs. He will need BMP twice weekly and follow up with nephrology in 2 weeks.  3. Acute on chronic anemia:In setting of kidney injury, post surgical/sepsis hypovulemia and with Hx of chronic anemia.  MCV WNL's. Started on B-12, s/p 4 units of PRBC. On oral iron. Hb stable at 8.2 at discharge.  FOBT positive at last day of admission. Patient asymptomatic. Refer to GI for evaluation. ([facility])  4. HTN: BP remained high during hospitalization although treated with Amlodipin 10 mg QD, Metoprolol 100 BID, Hydralazin 100 mg TID. Avoided ACE In, ARB and Duretics due to acute kidney injury.  can be due to hypervolemia and may improve as kidney funstion regain. Follow up with PCP and nephrologist.  Discharge Vitals:   BP (!) 174/82 (BP Location: Left Arm)   Pulse 78   Temp 98.2 F (36.8 C) (Oral)   Resp 18   Ht 5\' 8"  (1.727 m)   Wt 202 lb 6.1 oz (91.8 kg)   SpO2 94%   BMI 30.77 kg/m   Pertinent Labs, Studies, and Procedures:  CBC Latest Ref Rng & Units 01/21/2018 01/20/2018 01/17/2018  WBC 4.0 - 10.5 K/uL 11.3(H) 7.2 7.2  Hemoglobin 13.0 - 17.0 g/dL 8.2(L) 7.8(L) 8.2(L)  Hematocrit 39.0 - 52.0 % 26.0(L) 24.2(L) 25.6(L)  Platelets 150 - 400 K/uL 306 274 333   CMP Latest Ref Rng & Units 01/23/2018 01/22/2018 01/21/2018  Glucose 70 - 99 mg/dL 409(W) 119(J) 478(G)  BUN 8 - 23 mg/dL 95(A) 21(H) 08(M)  Creatinine 0.61 - 1.24 mg/dL 5.78(I) 6.96(E) 9.52(W)  Sodium 135 - 145 mmol/L 140 141 142  Potassium 3.5 - 5.1 mmol/L 3.8 3.4(L) 3.8  Chloride 98 - 111 mmol/L 103 102 101  CO2 22 - 32 mmol/L 28 29 29   Calcium 8.9 - 10.3 mg/dL 4.1(L) 2.4(M) 0.1(U)  Total Protein 6.5 - 8.1 g/dL - - -  Total Bilirubin 0.3 - 1.2 mg/dL - - -  Alkaline Phos 38 - 126 U/L -  - -  AST 15 - 41 U/L - - -  ALT 0 - 44 U/L - - -     Discharge Instructions: Discharge Instructions    Ambulatory referral to Gastroenterology   Complete by:  As directed    Positive FOBT   Diet - low sodium heart healthy   Complete by:  As directed    Discharge instructions   Complete by:  As directed    It was pleasure taking care of you. Please take all of your medications as directed. You will need a blood test every week to check your kidney function and hemoglobin level. We will make an appointment for you to be seen at gastroenterology as you are having some blood in your stool. Continue taking your iron supplement. Follow-up with your primary care within a week for hospital follow-up. Follow-up with Dr. Lajoyce Corners according to your schedule appointment next week.   Increase activity slowly   Complete by:  As directed       Signed: Chevis Pretty, MD 01/23/2018, 4:04 PM   Pager: Pager# [phone]

## 2018-01-04 NOTE — Anesthesia Procedure Notes (Signed)
Anesthesia Regional Block: Adductor canal block   Pre-Anesthetic Checklist: ,, timeout performed, Correct Patient, Correct Site, Correct Laterality, Correct Procedure, Correct Position, site marked, Risks and benefits discussed,  Surgical consent,  Pre-op evaluation,  At surgeon's request and post-op pain management  Laterality: Left  Prep: chloraprep       Needles:  Injection technique: Single-shot  Needle Type: Stimiplex     Needle Length: 9cm  Needle Gauge: 21     Additional Needles:   Procedures:,,,, ultrasound used (permanent image in chart),,,,  Narrative:  Start time: 01/04/2018 12:09 PM End time: 01/04/2018 12:11 PM Injection made incrementally with aspirations every 5 mL.  Performed by: Personally  Anesthesiologist: Lewie LoronGermeroth, Laquida Cotrell, MD  Additional Notes: BP cuff, EKG monitors applied. Sedation begun. Artery and nerve location verified with U/S and anesthetic injected incrementally, slowly, and after negative aspirations under direct u/s guidance. Good fascial /perineural spread. Tolerated well.

## 2018-01-04 NOTE — Transfer of Care (Signed)
Immediate Anesthesia Transfer of Care Note  Patient: Dessie Delcarlo  Procedure(s) Performed: LEFT BELOW KNEE AMPUTATION (Left Leg Lower)  Patient Location: PACU  Anesthesia Type:MAC and Regional  Level of Consciousness: drowsy  Airway & Oxygen Therapy: Patient Spontanous Breathing and Patient connected to face mask oxygen  Post-op Assessment: Report given to RN, Post -op Vital signs reviewed and stable and Patient moving all extremities  Post vital signs: Reviewed and stable  Last Vitals:  Vitals Value Taken Time  BP 100/51 01/04/2018  1:11 PM  Temp    Pulse 103 01/04/2018  1:14 PM  Resp 14 01/04/2018  1:14 PM  SpO2 100 % 01/04/2018  1:14 PM  Vitals shown include unvalidated device data.  Last Pain:  Vitals:   01/04/18 0800  TempSrc:   PainSc: 7       Patients Stated Pain Goal: 0 (21/11/55 2080)  Complications: No apparent anesthesia complications

## 2018-01-04 NOTE — Anesthesia Preprocedure Evaluation (Signed)
Anesthesia Evaluation  Patient identified by MRN, date of birth, ID band Patient awake    Reviewed: Allergy & Precautions, NPO status , Patient's Chart, lab work & pertinent test results  Airway Mallampati: II  TM Distance: >3 FB     Dental no notable dental hx. (+) Dental Advisory Given, Edentulous Upper, Poor Dentition   Pulmonary Current Smoker,    breath sounds clear to auscultation       Cardiovascular hypertension, Pt. on medications and Pt. on home beta blockers + CAD, + Past MI and + Peripheral Vascular Disease   Rhythm:Regular Rate:Normal     Neuro/Psych negative neurological ROS  negative psych ROS   GI/Hepatic negative GI ROS, Neg liver ROS,   Endo/Other  diabetes, Type 2, Oral Hypoglycemic Agents  Renal/GU negative Renal ROS     Musculoskeletal negative musculoskeletal ROS (+)   Abdominal   Peds  Hematology   Anesthesia Other Findings   Reproductive/Obstetrics                             Anesthesia Physical  Anesthesia Plan  ASA: III  Anesthesia Plan: Regional   Post-op Pain Management:    Induction: Intravenous  PONV Risk Score and Plan: 0 and Treatment may vary due to age or medical condition  Airway Management Planned:   Additional Equipment:   Intra-op Plan:   Post-operative Plan:   Informed Consent: I have reviewed the patients History and Physical, chart, labs and discussed the procedure including the risks, benefits and alternatives for the proposed anesthesia with the patient or authorized representative who has indicated his/her understanding and acceptance.   Dental advisory given  Plan Discussed with: CRNA  Anesthesia Plan Comments:         Anesthesia Quick Evaluation

## 2018-01-05 ENCOUNTER — Encounter (HOSPITAL_COMMUNITY): Payer: Self-pay | Admitting: Orthopedic Surgery

## 2018-01-05 DIAGNOSIS — Z791 Long term (current) use of non-steroidal anti-inflammatories (NSAID): Secondary | ICD-10-CM

## 2018-01-05 DIAGNOSIS — Z7984 Long term (current) use of oral hypoglycemic drugs: Secondary | ICD-10-CM

## 2018-01-05 DIAGNOSIS — E538 Deficiency of other specified B group vitamins: Secondary | ICD-10-CM

## 2018-01-05 DIAGNOSIS — I129 Hypertensive chronic kidney disease with stage 1 through stage 4 chronic kidney disease, or unspecified chronic kidney disease: Secondary | ICD-10-CM

## 2018-01-05 DIAGNOSIS — E1122 Type 2 diabetes mellitus with diabetic chronic kidney disease: Secondary | ICD-10-CM

## 2018-01-05 DIAGNOSIS — Z79899 Other long term (current) drug therapy: Secondary | ICD-10-CM

## 2018-01-05 DIAGNOSIS — Z978 Presence of other specified devices: Secondary | ICD-10-CM

## 2018-01-05 DIAGNOSIS — I96 Gangrene, not elsewhere classified: Secondary | ICD-10-CM

## 2018-01-05 DIAGNOSIS — N183 Chronic kidney disease, stage 3 (moderate): Secondary | ICD-10-CM

## 2018-01-05 DIAGNOSIS — Z7902 Long term (current) use of antithrombotics/antiplatelets: Secondary | ICD-10-CM

## 2018-01-05 DIAGNOSIS — D649 Anemia, unspecified: Secondary | ICD-10-CM

## 2018-01-05 DIAGNOSIS — Z89412 Acquired absence of left great toe: Secondary | ICD-10-CM

## 2018-01-05 DIAGNOSIS — Z89422 Acquired absence of other left toe(s): Secondary | ICD-10-CM

## 2018-01-05 DIAGNOSIS — N179 Acute kidney failure, unspecified: Secondary | ICD-10-CM

## 2018-01-05 DIAGNOSIS — I2510 Atherosclerotic heart disease of native coronary artery without angina pectoris: Secondary | ICD-10-CM

## 2018-01-05 DIAGNOSIS — E872 Acidosis: Secondary | ICD-10-CM

## 2018-01-05 DIAGNOSIS — Z7982 Long term (current) use of aspirin: Secondary | ICD-10-CM

## 2018-01-05 DIAGNOSIS — E1152 Type 2 diabetes mellitus with diabetic peripheral angiopathy with gangrene: Secondary | ICD-10-CM

## 2018-01-05 LAB — BPAM RBC
BLOOD PRODUCT EXPIRATION DATE: 201907272359
Blood Product Expiration Date: 201907282359
ISSUE DATE / TIME: 201906282118
ISSUE DATE / TIME: 201906271733
Unit Type and Rh: 5100
Unit Type and Rh: 5100

## 2018-01-05 LAB — BASIC METABOLIC PANEL
ANION GAP: 8 (ref 5–15)
BUN: 63 mg/dL — ABNORMAL HIGH (ref 8–23)
CALCIUM: 8.4 mg/dL — AB (ref 8.9–10.3)
CHLORIDE: 107 mmol/L (ref 98–111)
CO2: 21 mmol/L — ABNORMAL LOW (ref 22–32)
CREATININE: 7.76 mg/dL — AB (ref 0.61–1.24)
GFR calc non Af Amer: 7 mL/min — ABNORMAL LOW (ref >60)
GFR, EST AFRICAN AMERICAN: 8 mL/min — AB (ref >60)
Glucose, Bld: 82 mg/dL (ref 70–99)
Potassium: 5.2 mmol/L — ABNORMAL HIGH (ref 3.5–5.1)
SODIUM: 136 mmol/L (ref 135–145)

## 2018-01-05 LAB — TYPE AND SCREEN
ABO/RH(D): O POS
ANTIBODY SCREEN: NEGATIVE
UNIT DIVISION: 0
Unit division: 0

## 2018-01-05 LAB — PROTEIN / CREATININE RATIO, URINE
Creatinine, Urine: 135.21 mg/dL
Protein Creatinine Ratio: 1.53 mg/mg{Cre} — ABNORMAL HIGH (ref 0.00–0.15)
Total Protein, Urine: 207 mg/dL

## 2018-01-05 LAB — CBC
HCT: 23.5 % — ABNORMAL LOW (ref 39.0–52.0)
HEMOGLOBIN: 7.7 g/dL — AB (ref 13.0–17.0)
MCH: 28.2 pg (ref 26.0–34.0)
MCHC: 32.8 g/dL (ref 30.0–36.0)
MCV: 86.1 fL (ref 78.0–100.0)
PLATELETS: 317 10*3/uL (ref 150–400)
RBC: 2.73 MIL/uL — ABNORMAL LOW (ref 4.22–5.81)
RDW: 14.7 % (ref 11.5–15.5)
WBC: 25 10*3/uL — AB (ref 4.0–10.5)

## 2018-01-05 LAB — GLUCOSE, CAPILLARY
GLUCOSE-CAPILLARY: 99 mg/dL (ref 70–99)
GLUCOSE-CAPILLARY: 101 mg/dL — AB (ref 70–99)
GLUCOSE-CAPILLARY: 119 mg/dL — AB (ref 70–99)
Glucose-Capillary: 120 mg/dL — ABNORMAL HIGH (ref 70–99)
Glucose-Capillary: 131 mg/dL — ABNORMAL HIGH (ref 70–99)

## 2018-01-05 LAB — VANCOMYCIN, RANDOM: Vancomycin Rm: 16

## 2018-01-05 LAB — CK: CK TOTAL: 910 U/L — AB (ref 49–397)

## 2018-01-05 MED ORDER — PATIROMER SORBITEX CALCIUM 8.4 G PO PACK
8.4000 g | PACK | ORAL | Status: AC
  Filled 2018-01-05: qty 1

## 2018-01-05 MED ORDER — VANCOMYCIN HCL IN DEXTROSE 1-5 GM/200ML-% IV SOLN
1000.0000 mg | INTRAVENOUS | Status: DC
  Filled 2018-01-05: qty 200

## 2018-01-05 MED ORDER — LACTATED RINGERS IV BOLUS
500.0000 mL | Freq: Once | INTRAVENOUS | Status: AC
  Administered 2018-01-05: 500 mL via INTRAVENOUS

## 2018-01-05 MED ORDER — SODIUM CHLORIDE 0.9 % IV SOLN
1.0000 g | INTRAVENOUS | Status: AC
  Administered 2018-01-06 – 2018-01-07 (×2): 1 g via INTRAVENOUS
  Filled 2018-01-05 (×3): qty 1

## 2018-01-05 MED ORDER — VANCOMYCIN HCL IN DEXTROSE 1-5 GM/200ML-% IV SOLN
1000.0000 mg | INTRAVENOUS | Status: AC
  Administered 2018-01-05: 1000 mg via INTRAVENOUS
  Filled 2018-01-05: qty 200

## 2018-01-05 NOTE — Progress Notes (Addendum)
Sepsis alert for pt. MD updated via text page. Pt opening eyes spontaneously but still does not follow commands. Repetative motions of bilateral arms noted. Continues to have low urine output, but BP better. Spouse called to check on pt - I updated her.  Vital Signs Vitals:   01/05/18 0920 01/05/18 1237 01/05/18 1458 01/05/18 1812  BP: (!) 112/55 (!) 141/113 (!) 145/71 (!) 124/96  Pulse:   100 (!) 101  Resp:   18   Temp:  98.9 F (37.2 C) (!) 101 F (38.3 C) 99.2 F (37.3 C)  TempSrc:  Axillary Rectal Axillary  SpO2:  96% 90% 94%  Weight:      Height:         Lab Results WBC  Date/Time Value Ref Range Status  01/05/2018 04:59 AM 25.0 (H) 4.0 - 10.5 K/uL Final  01/04/2018 04:56 PM 34.9 (H) 4.0 - 10.5 K/uL Final  01/04/2018 06:49 AM 32.9 (H) 4.0 - 10.5 K/uL Final   Neutrophils Relative %  Date/Time Value Ref Range Status  01/03/2018 10:56 AM 88 % Final  08/05/2017 03:08 AM 49 % Final   No results found for: PCO2ART Lactic Acid, Venous  Date/Time Value Ref Range Status  01/03/2018 02:50 PM 1.54 0.5 - 1.9 mmol/L Final  01/03/2018 11:01 AM 3.24 (HH) 0.5 - 1.9 mmol/L Final   No results found for: PCO2VEN   Joel [name]  [name], RN 01/05/2018, 6:45 PM

## 2018-01-05 NOTE — Evaluation (Signed)
Physical Therapy Evaluation Patient Details Name: Joel Hebert MRN: [id number] DOB: Aug 16, 1955 Today's Date: 01/05/2018   History of Present Illness  Pt is a 62 y.o. 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 PMH includes initial L 5th toe amp 10/09/17,  DMII, PAD, PVD, MI, HTN.    Clinical Impression  Pt presents with an overall decrease in functional mobility secondary to above. Per chart, pt from SNF receiving PT/OT since recent L transmet amputation. Today, pt confused and lethargic throughout evaluation, only briefly, intermittently opening eyes to say a few words, but not answering questions or following commands consistently; not tracking with eye movements with apparent inattention to R-side. Pt only oriented to self. Bed level evaluation revealed functional strength at least 3/5 in all extremities; pt with good range of residual limb. RN notified of pt's current cognitive state and vitals (see below); RN to call rapid response nurse. Pt would benefit from continued acute PT services to maximize functional mobility and independence prior to d/c with continued SNF-level therapies.  BP (R forearm) 208/174 SpO2 90-91% on RA     Follow Up Recommendations SNF;Supervision for mobility/OOB    Equipment Recommendations  (TBD next venue)    Recommendations for Other Services       Precautions / Restrictions Precautions Precautions: Fall;Other (comment) Precaution Comments: L BKA wound vac Restrictions Weight Bearing Restrictions: Yes [name] Weight Bearing: Non weight bearing      Mobility  Bed Mobility               General bed mobility comments: Pt with impaired cognition not following commands or participating with bed level evaluation  Transfers                    Ambulation/Gait                Stairs            Wheelchair  Mobility    Modified Rankin (Stroke Patients Only)       Balance                                             Pertinent Vitals/Pain Pain Assessment: No/denies pain Pain Score: 0-No pain(Reports no pain, but grimacing with extremity movement)    Home Living Family/patient expects to be discharged to:: Skilled nursing facility                      Prior Function           Comments: Pt poor historian. From chart, admitted from SNF where he had been receiving rehab since recent L transmet amputation     Hand Dominance        Extremity/Trunk Assessment   Upper Extremity Assessment Upper Extremity Assessment: Difficult to assess due to impaired cognition(slight repetitive movement in RUE noted, but functionally strength seems [facility])    Lower Extremity Assessment Lower Extremity Assessment: Difficult to assess due to impaired cognition;[name] deficits/detail;RLE deficits/detail RLE Deficits / Details: Not moving RLE to command, but functionally appropriate ROM observed [name] Deficits / Details: Will move [name] to command; hip flex and knee flex/ext at least 3/5       Communication      Cognition Arousal/Alertness: Lethargic  Behavior During Therapy: Flat affect Overall Cognitive Status: Impaired/Different from baseline Area of Impairment: Orientation;Attention;Following commands;Problem solving                 Orientation Level: Disoriented to;Place;Time;Situation Current Attention Level: Focused   Following Commands: Follows one step commands inconsistently     Problem Solving: Slow processing;Decreased initiation;Difficulty sequencing;Requires verbal cues;Requires tactile cues General Comments: Pt will open eyes to say a few words, but not answering questions specifically, and immediately closing eyes again; will not follow or maintain eye contact. Not following commands. Confused      General Comments      Exercises      Assessment/Plan    PT Assessment Patient needs continued PT services  PT Problem List Decreased mobility;Decreased safety awareness;Decreased knowledge of precautions;Decreased activity tolerance;Pain;Decreased skin integrity;Decreased balance;Decreased knowledge of use of DME;Decreased cognition       PT Treatment Interventions DME instruction;Therapeutic activities;Gait training;Therapeutic exercise;Patient/family education;Balance training;Functional mobility training;Wheelchair mobility training    PT Goals (Current goals can be found in the Care Plan section)  Acute Rehab PT Goals PT Goal Formulation: Patient unable to participate in goal setting Time For Goal Achievement: 01/19/18    Frequency Min 3X/week   Barriers to discharge        Co-evaluation               AM-PAC PT "6 Clicks" Daily Activity  Outcome Measure Difficulty turning over in bed (including adjusting bedclothes, sheets and blankets)?: Unable Difficulty moving from lying on back to sitting on the side of the bed? : Unable Difficulty sitting down on and standing up from a chair with arms (e.g., wheelchair, bedside commode, etc,.)?: Unable Help needed moving to and from a bed to chair (including a wheelchair)?: A Lot Help needed walking in hospital room?: A Lot Help needed climbing 3-5 steps with a railing? : Total 6 Click Score: 8    End of Session   Activity Tolerance: Patient limited by lethargy Patient left: in bed;with call bell/phone within reach;with bed alarm set Nurse Communication: Mobility status;Other (comment)(cognitive status; vitals) PT Visit Diagnosis: Other abnormalities of gait and mobility (R26.89);Difficulty in walking, not elsewhere classified (R26.2)    Time: 1335-1400 PT Time Calculation (min) (ACUTE ONLY): 25 min   Charges:   PT Evaluation $PT Eval Moderate Complexity: 1 Mod     PT G Codes:       Ina HomesJaclyn Jamas Jaquay, PT, DPT Acute Rehab Services  Pager:  [phone]  Malachy ChamberJaclyn L Kaitlyne Friedhoff 01/05/2018, 3:55 PM

## 2018-01-05 NOTE — Progress Notes (Addendum)
BP (!) 145/71 (BP Location: Left Arm)   Pulse 100   Temp (!) 101 F (38.3 C) (Rectal)   Resp 18   Ht 5\' 8"  (1.727 m)   Wt 93.3 kg (205 lb 11 oz)   SpO2 90%   BMI 31.27 kg/m    Pt continues to be confused, not following commands, opens eyes to voice but does not follow or maintain eye contact. Pt will say a few words, but does not answer questions. Slight, repetative movements of arms and right leg noted.  Pt now has coarse breath sounds, sats are 90% on room air. Temperature taken rectally to get most accurate measurement, 101F. Pt feels warm to touch. BP 145/71 on left bicep - had been taking blood pressure on right forearm as pt has IV running in left AC and IV saline locked in right bicep. Pt denies pain by stating "no" to question of "do you have pain anywhere? Do you hurt?" but also grimaces. IVF sodium bicarb @125cc /hr. Pt has little urine output. Trace edema to right leg, none noted to left stump. Wound vac intact to left stump, pulse 2+.  MD paged with updates. Continuing Vancomycin, Cefepime, and Flagyll, monitoring VS, and output.

## 2018-01-05 NOTE — Progress Notes (Signed)
   Subjective: Appears tired, not able to participate in conversation about how he is feeling today. Says " hello how are you"   Objective:  Vital signs in last 24 hours: Vitals:   01/04/18 2143 01/05/18 0031 01/05/18 0201 01/05/18 0453  BP: (!) 108/58 120/63  (!) 125/53  Pulse: 84 86    Resp: 16 16  15   Temp: 98 F (36.7 C) 97.7 F (36.5 C)  (!) 97.2 F (36.2 C)  TempSrc: Oral Axillary  Oral  SpO2: 100% 100%    Weight:   205 lb 11 oz (93.3 kg)   Height:       General: eyes are closed, he appears tired but opens his eyes and says " yep" when his name is called  Cardiac: regular rate and rhythm, normal s1 and S2, no murmur appreciated  Pulm: normal work of breathing, lung sounds are clear over the lateral chest wall  Neuro: not following commands, appears tired and will not participate when asked orientation questions, muscle jerking of the right hand and foot    Assessment/Plan:  Active Problems:   Sepsis (HCC)   Acute renal failure (HCC)  Wet Gangrene of Left Transmetatarsal Stump: One day s/p transtibial amputation with Dr. Lajoyce Cornersuda 6/28 with wound vac placed and to stay in place for 1 week, non weight bearing - initially with lactic acidosis at the time of admission but this has resolved  - tachycardia improved, hypotensive overnight, remains afebrile  - blood cultures drawn 6/27 with no growth in 2 days  -- will discontinue IV cefepime today  -IV metronidazole 6/27 >> discontinue today  - vanc discontinued as we now have source control and for renal protection  -- Dr. Lajoyce Cornersuda consulted, appreciate recommendations  -- Oxy IR and Dilaudid PRN for pain  - PT evaluation pending   Acute Renal Failure on CKD III  Unclear etiology, possibly due to sepsis and NSAID use in the setting of lisinopril and HCTZ. Baseline creatinine is around 1.3, on admission he was found to have creatinine 6.29, hyperkalemia, and hypertension. Renal ultrasound was with normal echogenicity and no  hydronephrosis.  - K improved to 5.2 from 5.9  this morning after one diose of veltessa  - crt 7.76, not hypertensive and not volume overloaded. Still with acidosis but bicarb is improved to 21 since starting the bicarbonate gtt.  -- Renal U/S to rule out obstruction was unremarkable -- UA with hyaline and granular cast, amorphous crystal 1+ protein, small hemoglobin and 0-5 RBCs - will obtain CK  -- Strict I&Os ordered, net + 3 liters this admission  -- nephrology consulted, we appreciate their recommendations  Acute on Chronic Normocytic Anemia: -- B12 level found to be low (96) continue Vit-B12 supplements -- Hgb 6.6 yesterday, transfused one unit and it Improved to 7.7 today   HTN: -- Continue metop 100mg  BID -- Holding lisinopril-HCTZ given AKI  Type II DM:  Last A1c on 12/14/2017 was 7.2 -- Hold metformin given lactic acidosis and infection - SSI-sensitive TID AC + QHS -- CBG checks QID   PVD: -- Continue ASA &Plavix  -- Holding pentoxifylline in the setting of renal failure  Dispo: Anticipated discharge in approximately 2-3 day(s).   Eulah PontBlum, Terrion Poblano, MD 01/05/2018, 9:14 AM Pager: [phone]

## 2018-01-05 NOTE — Progress Notes (Addendum)
BP (!) 112/55 (BP Location: Right Arm)   Pulse 86   Temp (!) 97.2 F (36.2 C) (Oral)   Resp 15   Ht 5\' 8"  (1.727 m)   Wt 93.3 kg (205 lb 11 oz)   SpO2 100%   BMI 31.27 kg/m   MD paged concerning whether to give metoprolol 100mg  with BP 112/55, okay with holding medication at this time. Pt is confused, swallowed pills but needed frequent encouragement to complete task. Denies pain, does not appear to be in pain. SCD on right leg, wound vac to left stump. Left popliteal pulse weak but palpable.

## 2018-01-05 NOTE — Progress Notes (Signed)
Patient has low urine output performed bladder scan it was 27 ml on call MD aware and given lactate ringer 500 ml iv  Bolus. Flushed the foley's catheter as per order.

## 2018-01-05 NOTE — Progress Notes (Addendum)
ANTIBIOTIC CONSULT NOTE  Pharmacy Consult for Vancomycin Indication: sepsis and gangrene  No Known Allergies  Patient Measurements: Height: 5\' 8"  (172.7 cm) Weight: 205 lb 11 oz (93.3 kg) IBW/kg (Calculated) : 68.4 Adjusted Body Weight:    Vital Signs: Temp: 97.2 F (36.2 C) (06/29 0453) Temp Source: Oral (06/29 0453) BP: 125/53 (06/29 0453) Pulse Rate: 86 (06/29 0031) Intake/Output from previous day: 06/28 0701 - 06/29 0700 In: 3438.3 [P.O.:120; I.V.:2103.3; Blood:315; IV Piggyback:900] Out: 420 [Urine:370; Blood:50] Intake/Output from this shift: No intake/output data recorded.  Labs: Recent Labs    01/04/18 0649 01/04/18 1656 01/05/18 0459  WBC 32.9* 34.9* 25.0*  HGB 8.5* 6.6* 7.7*  PLT 417* 351 317  CREATININE 6.64* 7.23* 7.76*   Estimated Creatinine Clearance: 10.8 mL/min (A) (by C-G formula based on SCr of 7.76 mg/dL (H)). Recent Labs    01/04/18 0854 01/05/18 0459  VANCORANDOM 22 16     Microbiology:   Medical History: Past Medical History:  Diagnosis Date  . Coronary artery disease   . High cholesterol   . Hypertension   . MI (myocardial infarction) (HCC) 08/13/2014; 01/2016- 03/2016 X 3  . PAD (peripheral artery disease) (HCC)   . Peripheral vascular disease (HCC)   . Type II diabetes mellitus (HCC)     Assessment:  ID: for L transtibial amputation on 6/28- planned, (s/p left TM amputation 6/7). Here with fever > afebrile, LA elevated, WBC 34.9>25 today. Scr up again. Cultures pending Nephrology note says Vanco d/c'd, but it wasn't (just held based on levels)  Vancomycin 6/27 >>  Cefepime 6/27 >>  Flagyl 6/27 >>   6/28: VR 22: do not redose 6/29: Vanco Random 16:  6/27 BCx: ngtd 6/27 MRSA PCR neg, Staph aureus positive  Goal of Therapy:  Vancomycin trough level 10-15 mcg/ml  Plan:  Continue Cefepime 1g IV q 24h Vancomycin 1g IV q 48 hrs   Shantese Raven S. Merilynn Finlandobertson, PharmD, BCPS Clinical Staff Pharmacist Pager [phone]  Misty Stanleyobertson,  Doni Widmer Stillinger 01/05/2018,7:39 AM

## 2018-01-05 NOTE — Progress Notes (Signed)
Pt has eyes opened. Asked him if he was okay. He stated yes I am ok. Nelda MarseilleJenny Thacker, RN

## 2018-01-05 NOTE — Progress Notes (Addendum)
Bladder scanned for 21cc with good margins.  Foley emptied for 100cc amber urine.  Scab on right arm, lateral side of elbow - pt's wife called and asked about it. Assessed; scab is intact, no bumps or fluid felt under skin. Does not appear to be painful.

## 2018-01-05 NOTE — Progress Notes (Signed)
     Subjective: 1 Day Post-Op Procedure(s) (LRB): LEFT BELOW KNEE AMPUTATION (Left) Awake, alert and oriented x 2. No very talkative today.   Patient reports pain as mild.    Objective:   VITALS:  Temp:  [97.2 F (36.2 C)-100.1 F (37.8 C)] 97.2 F (36.2 C) (06/29 0453) Pulse Rate:  [82-93] 86 (06/29 0031) Resp:  [12-18] 15 (06/29 0453) BP: (100-133)/(48-63) 112/55 (06/29 0920) SpO2:  [92 %-100 %] 100 % (06/29 0031) Weight:  [205 lb 0.4 oz (93 kg)-205 lb 11 oz (93.3 kg)] 205 lb 11 oz (93.3 kg) (06/29 0201)  Neurologically intact ABD soft Incision: scant drainage Compartment soft   LABS Recent Labs    01/03/18 1056 01/03/18 2348 01/04/18 0649 01/04/18 1656 01/05/18 0459  HGB 6.8* 7.2* 8.5* 6.6* 7.7*  WBC 36.2* 27.9* 32.9* 34.9* 25.0*  PLT 452* 353 417* 351 317   Recent Labs    01/04/18 1656 01/05/18 0459  NA 135 136  K 5.9* 5.2*  CL 108 107  CO2 19* 21*  BUN 61* 63*  CREATININE 7.23* 7.76*  GLUCOSE 124* 82   No results for input(s): LABPT, INR in the last 72 hours.   Assessment/Plan: 1 Day Post-Op Procedure(s) (LRB): LEFT BELOW KNEE AMPUTATION (Left)  Advance diet Up with therapy Discharge to SNF  Vira BrownsJames Nitka 01/05/2018, 11:13 AMPatient ID: Bronson CurbMelvin Robert J Tabbert, male   DOB: 03/05/1956, 62 y.o.   MRN: [id number]

## 2018-01-05 NOTE — Consult Note (Signed)
[facility] NEPHROLOGY PROGRESS NOTE  Assessment/ Plan: Pt is a 62 y.o. yo male with history of hypertension, diabetes A1c 7.2, CAD, PAD, CKD, left foot gangrene, recent transmetatarsal amputation presented from SNF for fever in the setting of gangrenous foot.  Nephrology consulted for renal failure.  #Acute kidney injury on CKD stage III likely ATN in the setting of sepsis/NSAIDs, lisinopril, HCTZ use.  Baseline serum creatinine level around 1-1.3.  Serum creatinine level continue to worsen to 7.76 today.  May have some hemodynamic compromise during OR yesterday.  He is oliguric with urine output of 370cc/24 hrs. Ultrasound of kidneys with normal echogenicity and no obstruction. UA with UTI,  He has intraurethral catheter, check bladder scan.  I will continue sodium bicarbonate IV fluid.  Closely monitor Vanco level.  Avoid nephrotoxins.  #Hyperkalemia: Continue sod bicarb IVF.  Order another dose of Veltassa today.  Monitor lab.  #Hypertension: Monitor blood pressure.  On metoprolol.  #Left foot gangrene status post amputation by Dr. Lajoyce Corners 6/28.  Minimize sedatives.  Subjective: Seen and examined at bedside.  Denied nausea, vomiting, chest pain or shortness of breath.  Not fully oriented. Objective Vital signs in last 24 hours: Vitals:   01/05/18 0031 01/05/18 0201 01/05/18 0453 01/05/18 0920  BP: 120/63  (!) 125/53 (!) 112/55  Pulse: 86     Resp: 16  15   Temp: 97.7 F (36.5 C)  (!) 97.2 F (36.2 C)   TempSrc: Axillary  Oral   SpO2: 100%     Weight:  93.3 kg (205 lb 11 oz)    Height:       Weight change: -0.7 kg (-1 lb 8.7 oz)  Intake/Output Summary (Last 24 hours) at 01/05/2018 1215 Last data filed at 01/05/2018 4098 Gross per 24 hour  Intake 3438.34 ml  Output 420 ml  Net 3018.34 ml       Labs: Basic Metabolic Panel: Recent Labs  Lab 01/04/18 0649 01/04/18 1656 01/05/18 0459  NA 134* 135 136  K 5.5* 5.9* 5.2*  CL 102 108 107  CO2 18* 19*  21*  GLUCOSE 123* 124* 82  BUN 58* 61* 63*  CREATININE 6.64* 7.23* 7.76*  CALCIUM 8.6* 8.1* 8.4*   Liver Function Tests: Recent Labs  Lab 01/03/18 1056  AST 39  ALT 25  ALKPHOS 195*  BILITOT 0.3  PROT 6.6  ALBUMIN 2.2*   No results for input(s): LIPASE, AMYLASE in the last 168 hours. No results for input(s): AMMONIA in the last 168 hours. CBC: Recent Labs  Lab 01/03/18 1056 01/03/18 2348 01/04/18 0649 01/04/18 1656 01/05/18 0459  WBC 36.2* 27.9* 32.9* 34.9* 25.0*  NEUTROABS 31.8*  --   --   --   --   HGB 6.8* 7.2* 8.5* 6.6* 7.7*  HCT 21.9* 23.1* 27.8* 21.0* 23.5*  MCV 86.6 86.5 87.7 86.8 86.1  PLT 452* 353 417* 351 317   Cardiac Enzymes: Recent Labs  Lab 01/05/18 0459  CKTOTAL 910*   CBG: Recent Labs  Lab 01/04/18 1724 01/04/18 2121 01/05/18 0026 01/05/18 0803 01/05/18 1154  GLUCAP 120* 109* 131* 99 101*    Iron Studies:  Recent Labs    01/03/18 1213  IRON 20*  TIBC 185*  FERRITIN 695*   Studies/Results: US Renal  Result Date: 01/03/2018 CLINICAL DATA:  Acute renal failure 1 day. EXAM: RENAL / URINARY TRACT ULTRASOUND COMPLETE COMPARISON:  None. FINDINGS: Right Kidney: Length: 12.3 cm. Echogenicity within normal limits. No mass or hydronephrosis visualized. Left Kidney:  Length: 12.1 cm. Echogenicity within normal limits. No mass or hydronephrosis visualized. Bladder: Appears normal for degree of bladder distention. IMPRESSION: Normal renal ultrasound. Electronically Signed   By: Elberta Fortisaniel  Boyle M.D.   On: 01/03/2018 21:42    Medications: Infusions: . sodium chloride    . sodium chloride    . ceFEPime (MAXIPIME) IV 1 g (01/04/18 1510)  . methocarbamol (ROBAXIN)  IV    . metronidazole 500 mg (01/05/18 0505)  .  sodium bicarbonate infusion 1/4 NS 1000 mL 125 mL/hr at 01/04/18 1818  . vancomycin      Scheduled Medications: . aspirin EC  81 mg Oral BID  . Chlorhexidine Gluconate Cloth  6 each Topical Daily  . clopidogrel  75 mg Oral Q breakfast   . docusate sodium  100 mg Oral BID  . gabapentin  300 mg Oral QHS  . insulin aspart  0-5 Units Subcutaneous QHS  . insulin aspart  0-9 Units Subcutaneous TID WC  . metoprolol tartrate  100 mg Oral BID  . mupirocin ointment  1 application Nasal BID  . patiromer  8.4 g Oral NOW  . rosuvastatin  20 mg Oral QPM  . vitamin B-12  1,000 mcg Oral Daily    have reviewed scheduled and prn medications.  Physical Exam: General:NAD, comfortable Heart:RRR, s1s2 nl, no rubs Lungs:clear b/l, no crackle Abdomen:soft, Non-tender, non-distended Extremities:No edema   Joel Hebert Joel Hebert 01/05/2018,12:15 PM  LOS: 2 days

## 2018-01-06 DIAGNOSIS — Z89512 Acquired absence of left leg below knee: Secondary | ICD-10-CM

## 2018-01-06 LAB — CBC
HCT: 22.9 % — ABNORMAL LOW (ref 39.0–52.0)
Hemoglobin: 7.5 g/dL — ABNORMAL LOW (ref 13.0–17.0)
MCH: 27.6 pg (ref 26.0–34.0)
MCHC: 32.8 g/dL (ref 30.0–36.0)
MCV: 84.2 fL (ref 78.0–100.0)
PLATELETS: 380 10*3/uL (ref 150–400)
RBC: 2.72 MIL/uL — ABNORMAL LOW (ref 4.22–5.81)
RDW: 15.3 % (ref 11.5–15.5)
WBC: 23.4 10*3/uL — ABNORMAL HIGH (ref 4.0–10.5)

## 2018-01-06 LAB — BASIC METABOLIC PANEL
ANION GAP: 12 (ref 5–15)
BUN: 72 mg/dL — ABNORMAL HIGH (ref 8–23)
CO2: 22 mmol/L (ref 22–32)
Calcium: 8.3 mg/dL — ABNORMAL LOW (ref 8.9–10.3)
Chloride: 101 mmol/L (ref 98–111)
Creatinine, Ser: 8.77 mg/dL — ABNORMAL HIGH (ref 0.61–1.24)
GFR calc non Af Amer: 6 mL/min — ABNORMAL LOW (ref >60)
GFR, EST AFRICAN AMERICAN: 7 mL/min — AB (ref >60)
GLUCOSE: 122 mg/dL — AB (ref 70–99)
POTASSIUM: 5.3 mmol/L — AB (ref 3.5–5.1)
Sodium: 135 mmol/L (ref 135–145)

## 2018-01-06 LAB — RENAL FUNCTION PANEL
ANION GAP: 14 (ref 5–15)
Albumin: 1.6 g/dL — ABNORMAL LOW (ref 3.5–5.0)
BUN: 69 mg/dL — AB (ref 8–23)
CHLORIDE: 100 mmol/L (ref 98–111)
CO2: 22 mmol/L (ref 22–32)
Calcium: 8.2 mg/dL — ABNORMAL LOW (ref 8.9–10.3)
Creatinine, Ser: 8.64 mg/dL — ABNORMAL HIGH (ref 0.61–1.24)
GFR calc Af Amer: 7 mL/min — ABNORMAL LOW (ref >60)
GFR calc non Af Amer: 6 mL/min — ABNORMAL LOW (ref >60)
Glucose, Bld: 110 mg/dL — ABNORMAL HIGH (ref 70–99)
POTASSIUM: 5.3 mmol/L — AB (ref 3.5–5.1)
Phosphorus: 6.4 mg/dL — ABNORMAL HIGH (ref 2.5–4.6)
SODIUM: 136 mmol/L (ref 135–145)

## 2018-01-06 LAB — GLUCOSE, CAPILLARY
GLUCOSE-CAPILLARY: 123 mg/dL — AB (ref 70–99)
Glucose-Capillary: 127 mg/dL — ABNORMAL HIGH (ref 70–99)
Glucose-Capillary: 122 mg/dL — ABNORMAL HIGH (ref 70–99)
Glucose-Capillary: 117 mg/dL — ABNORMAL HIGH (ref 70–99)

## 2018-01-06 MED ORDER — PATIROMER SORBITEX CALCIUM 8.4 G PO PACK
8.4000 g | PACK | ORAL | Status: AC
  Administered 2018-01-06: 8.4 g via ORAL
  Filled 2018-01-06 (×2): qty 1

## 2018-01-06 MED ORDER — SODIUM POLYSTYRENE SULFONATE 15 GM/60ML PO SUSP
30.0000 g | Freq: Once | ORAL | Status: AC
  Administered 2018-01-06: 30 g via ORAL
  Filled 2018-01-06: qty 120

## 2018-01-06 MED ORDER — ALBUMIN HUMAN 25 % IV SOLN
25.0000 g | Freq: Four times a day (QID) | INTRAVENOUS | Status: AC
  Administered 2018-01-06 – 2018-01-07 (×4): 25 g via INTRAVENOUS
  Filled 2018-01-06: qty 150
  Filled 2018-01-06: qty 50
  Filled 2018-01-06 (×2): qty 100

## 2018-01-06 MED ORDER — ORAL CARE MOUTH RINSE
15.0000 mL | Freq: Two times a day (BID) | OROMUCOSAL | Status: DC
  Administered 2018-01-06 – 2018-01-15 (×17): 15 mL via OROMUCOSAL

## 2018-01-06 MED ORDER — AMLODIPINE BESYLATE 5 MG PO TABS
5.0000 mg | ORAL_TABLET | Freq: Every day | ORAL | Status: DC
  Administered 2018-01-06 – 2018-01-11 (×4): 5 mg via ORAL
  Filled 2018-01-06 (×5): qty 1

## 2018-01-06 NOTE — Progress Notes (Signed)
     Subjective: 2 Days Post-Op Procedure(s) (LRB): LEFT BELOW KNEE AMPUTATION (Left) Dressing VAC left BKA residual limb intact and drawing negative pressure.   Patient reports pain as moderate.    Objective:   VITALS:  Temp:  [98.2 F (36.8 C)-101 F (38.3 C)] 98.5 F (36.9 C) (06/30 0819) Pulse Rate:  [95-118] 118 (06/30 0819) Resp:  [18] 18 (06/30 0819) BP: (124-147)/(56-113) 147/66 (06/30 0819) SpO2:  [90 %-96 %] 94 % (06/30 0819)  Neurologically intact Neurovascular intact Intact pulses distally Dorsiflexion/Plantar flexion intact VAC in good condition no leak. No purulence.   LABS Recent Labs    01/03/18 2348 01/04/18 0649 01/04/18 1656 01/05/18 0459 01/06/18 0701  HGB 7.2* 8.5* 6.6* 7.7* 7.5*  WBC 27.9* 32.9* 34.9* 25.0* 23.4*  PLT 353 417* 351 317 380   Recent Labs    01/05/18 0459 01/06/18 1030  NA 136 136  K 5.2* 5.3*  CL 107 100  CO2 21* 22  BUN 63* 69*  CREATININE 7.76* 8.64*  GLUCOSE 82 110*   No results for input(s): LABPT, INR in the last 72 hours.   Assessment/Plan: 2 Days Post-Op Procedure(s) (LRB): LEFT BELOW KNEE AMPUTATION (Left)  Advance diet Up with therapy Discharge to SNF  Joel Hebert 01/06/2018, 11:48 AMPatient ID: Joel Hebert, male   DOB: 07/13/1955, 62 y.o.   MRN: [id number]

## 2018-01-06 NOTE — Progress Notes (Signed)
   Subjective: Patient was feeling little better today.  He was able to communicate, stating that he does not has a good appetite, pain is controlled if he does not move or touches his limb.  Appears little confused.  Objective:  Vital signs in last 24 hours: Vitals:   01/06/18 0011 01/06/18 0514 01/06/18 0819 01/06/18 1206  BP: (!) 138/96 (!) 142/63 (!) 147/66 (!) 164/76  Pulse: 95 98 (!) 118 89  Resp: 18 18 18 18   Temp: 99.6 F (37.6 C) 98.2 F (36.8 C) 98.5 F (36.9 C) 98.4 F (36.9 C)  TempSrc: Oral  Axillary Axillary  SpO2: 93%  94% 99%  Weight:      Height:       General.  Well-developed gentleman, lying comfortably in his bed, alert and oriented to self, in no acute distress. Lungs.  Clear bilaterally, normal work of breathing. CV.  Regular rate and rhythm. Extremities.  Left BKA with wound VAC.  No right extremity edema.  Assessment/Plan:  62 year old gentleman presented with fever from SNF, history of extensive PVD S/P bilateral lower extremity revascularization.  Failed big toe amputation resulted in transmetatarsal amputation 2 weeks ago, admitted with wound dehiscence and wet gangrene at surgical site, had transtibial amputation on January 04, 2018.  Wet Gangrene of Left Transmetatarsal Stump:Today postop of transtibial amputation with Dr. Lajoyce Cornersuda. Wound VAC.in placed, which will stay for 1 week.  Patient will remain non weightbearing. Become febrile to 101 yesterday afternoon, remained afebrile overnight. Blood culture from January 03, 2018 remain negative. Patient to remain on cefepime for another day. Vancomycin and metronidazole was discontinued yesterday. Pain control with OxyIR and Dilaudid as needed. PT recommending SNF placement once ready for discharge.  Acute Renal Failure on CKD III . Unclear etiology, possibly due to sepsis and NSAID use in the setting of lisinopril and HCTZ.  Creatinine continued to rise, it was 8.64 with BUN of 69 today, baseline 1.3. Bicarb  improved to 22 today. UA with hyaline and granular casts, 1+ protein and small amount of hemoglobin with 0-5 RBCs. Nephrology is following and we appreciate the recommendations. CK elevated at 910. Urine output approximately 400 cc over the past 24-hour, with a net gain of about 5 L. Nephrology is following and we appreciate their recommendations. -Continue monitoring.  Hyperkalemia.  Potassium at 5.3, slightly increased from 5.2 yesterday. Most likely secondary to renal failure. -Kayexalate 30 g. -Repeat BMP in the afternoon.  Acute on Chronic Normocytic Anemia:B12 level at 96. Iron studies with low iron saturation at 11 and elevated ferritin level at 695. Hemoglobin at 7.5 after 1 unit of packed RBCs. -Continue monitoring. -Continue B12 supplement. -We will get benefit with iron supplement on discharge.  ZOX:WRUEAHTN:Blood pressure elevated this morning. -Continuemetop 100mg  BID. -Add amlodipine 5 mg daily. - Holding lisinopril-HCTZ given AKI  Type II DM:  Last A1c on 12/14/2017 was 7.2 - Hold metformin given lactic acidosis and infection -CBG between 101-127 over last 24-hour. -Continue SSI-sensitive TID AC + QHS  PVD: -- Continue ASA &Plavix  -- Holding pentoxifylline in the setting of renal failure  Dispo: Anticipated discharge in approximately 2-3 day(s).   Arnetha CourserAmin, Jamez Ambrocio, MD 01/06/2018, 12:16 PM Pager: [phone]

## 2018-01-06 NOTE — Progress Notes (Signed)
Patient attempted to feed patient for 15 minutes, patient refused.

## 2018-01-06 NOTE — Progress Notes (Signed)
Patients wife, provider, and nurse bedside. Patients wife is concerned that he can not see and is "not making any sense", provider placed orders.

## 2018-01-06 NOTE — Progress Notes (Signed)
[facility] NEPHROLOGY PROGRESS NOTE  Assessment/ Plan: Pt is a 62 y.o. yo male with history of hypertension, diabetes A1c 7.2, CAD, PAD, CKD, left foot gangrene, recent transmetatarsal amputation presented from SNF for fever in the setting of gangrenous foot.  Nephrology consulted for renal failure.  #Acute kidney injury on CKD stage III likely ATN in the setting of sepsis/NSAIDs, lisinopril, HCTZ use.  Baseline serum creatinine level around 1-1.3. Serum creatinine level continue to worsen to 8.6 today with urine output of only 425 cc.  Patient was febrile yesterday and now has confusion and no oral intake.  May have some hemodynamic compromise during OR on 6/28. Ultrasound of kidneys with normal echogenicity and no obstruction. UA with UTI, no RBC. urine PCR 1.53. Check hep B, C, ANA and complements.  -check bladder scan, start albumin IV x 5 doses.  -reduce IVF rate to 75 cc. CO2 level 22. Monitor vanc level.  Avoid nephrotoxins.  #Hyperkalemia: Continue sod bicarb IVF. K 5.3, ordered veltassa if able to take orally.  Monitor lab.  #Hypertension: Monitor blood pressure.  On metoprolol and norvasc  #Left foot gangrene status post amputation by Dr. Lajoyce Cornersuda 6/28.  # Acute encephalopathy: due to opiates, neurontin, anesthesia in the setting of reduce GFR. Minimize sedatives. Per IM team.  D/w RN  Subjective: Seen and examined at bedside.  Patient was confused and not oriented.  Review of systems limited. Objective Vital signs in last 24 hours: Vitals:   01/06/18 0011 01/06/18 0514 01/06/18 0819 01/06/18 1206  BP: (!) 138/96 (!) 142/63 (!) 147/66 (!) 164/76  Pulse: 95 98 (!) 118 89  Resp: 18 18 18 18   Temp: 99.6 F (37.6 C) 98.2 F (36.8 C) 98.5 F (36.9 C) 98.4 F (36.9 C)  TempSrc: Oral  Axillary Axillary  SpO2: 93%  94% 99%  Weight:      Height:       Weight change:   Intake/Output Summary (Last 24 hours) at 01/06/2018 1249 Last data filed at 01/06/2018  0900 Gross per 24 hour  Intake 1651.22 ml  Output 425 ml  Net 1226.22 ml       Labs: Basic Metabolic Panel: Recent Labs  Lab 01/04/18 1656 01/05/18 0459 01/06/18 1030  NA 135 136 136  K 5.9* 5.2* 5.3*  CL 108 107 100  CO2 19* 21* 22  GLUCOSE 124* 82 110*  BUN 61* 63* 69*  CREATININE 7.23* 7.76* 8.64*  CALCIUM 8.1* 8.4* 8.2*  PHOS  --   --  6.4*   Liver Function Tests: Recent Labs  Lab 01/03/18 1056 01/06/18 1030  AST 39  --   ALT 25  --   ALKPHOS 195*  --   BILITOT 0.3  --   PROT 6.6  --   ALBUMIN 2.2* 1.6*   No results for input(s): LIPASE, AMYLASE in the last 168 hours. No results for input(s): AMMONIA in the last 168 hours. CBC: Recent Labs  Lab 01/03/18 1056 01/03/18 2348 01/04/18 0649 01/04/18 1656 01/05/18 0459 01/06/18 0701  WBC 36.2* 27.9* 32.9* 34.9* 25.0* 23.4*  NEUTROABS 31.8*  --   --   --   --   --   HGB 6.8* 7.2* 8.5* 6.6* 7.7* 7.5*  HCT 21.9* 23.1* 27.8* 21.0* 23.5* 22.9*  MCV 86.6 86.5 87.7 86.8 86.1 84.2  PLT 452* 353 417* 351 317 380   Cardiac Enzymes: Recent Labs  Lab 01/05/18 0459  CKTOTAL 910*   CBG: Recent Labs  Lab 01/05/18 1154  01/05/18 1727 01/05/18 2143 01/06/18 0817 01/06/18 1202  GLUCAP 101* 119* 120* 123* 127*    Iron Studies:  No results for input(s): IRON, TIBC, TRANSFERRIN, FERRITIN in the last 72 hours. Studies/Results: No results found.  Medications: Infusions: . sodium chloride    . sodium chloride 10 mL/hr at 01/06/18 1226  . ceFEPime (MAXIPIME) IV    . methocarbamol (ROBAXIN)  IV    . metronidazole 500 mg (01/06/18 1234)  .  sodium bicarbonate infusion 1/4 NS 1000 mL 125 mL/hr at 01/06/18 0605    Scheduled Medications: . aspirin EC  81 mg Oral BID  . clopidogrel  75 mg Oral Q breakfast  . docusate sodium  100 mg Oral BID  . gabapentin  300 mg Oral QHS  . insulin aspart  0-5 Units Subcutaneous QHS  . insulin aspart  0-9 Units Subcutaneous TID WC  . metoprolol tartrate  100 mg Oral BID   . rosuvastatin  20 mg Oral QPM  . sodium polystyrene  30 g Oral Once  . vitamin B-12  1,000 mcg Oral Daily    have reviewed scheduled and prn medications.  Physical Exam: General: Confused male, lying in bed Heart:RRR, s1s2 nl, no rubs Lungs: Clear bilateral, no crackle Abdomen:soft, Non-tender, non-distended Extremities: No edema Neurology: Alert, awake but not oriented  Dron Jaynie Collins 01/06/2018,12:49 PM  LOS: 3 days

## 2018-01-06 NOTE — Progress Notes (Signed)
Paged by nurse regarding [name]'s altered level of consciousness and refusing p.o. meds. On evaluation patient was more confused as compared to when seen this morning during rounds.  Not following commands. He took his morning meds.,  Now refusing to take Kayexalate. His wife and daughter was present in the room and was concerned that patient is not interacting with them. On exam lungs were clear bilaterally, regular rate and rhythm, afebrile. No urinary output since removal of Foley this morning. Bladder scan with approximately 190 cc.  Plan. Patient with worsening renal function and mild hyperkalemia. -Transfer him to stepdown unit for more monitoring. -Repeat BMP later today, if potassium continues to rise will need an EKG and urgent nephrology evaluation again. -Renal ordered hep B, C, ANA and complement levels in order to preparation for possible dialysis.

## 2018-01-06 NOTE — Progress Notes (Addendum)
Provider paged to check on orders, verbal order given to discontinue four hour vitals, four hour pain assessment, and to remove foley catheter.

## 2018-01-07 ENCOUNTER — Inpatient Hospital Stay (HOSPITAL_COMMUNITY): Payer: Medicaid Other

## 2018-01-07 ENCOUNTER — Encounter (HOSPITAL_COMMUNITY): Payer: Self-pay | Admitting: Interventional Radiology

## 2018-01-07 DIAGNOSIS — R509 Fever, unspecified: Secondary | ICD-10-CM

## 2018-01-07 HISTORY — PX: IR US GUIDE VASC ACCESS RIGHT: IMG2390

## 2018-01-07 HISTORY — PX: IR FLUORO GUIDE CV LINE RIGHT: IMG2283

## 2018-01-07 LAB — RENAL FUNCTION PANEL
Albumin: 2.3 g/dL — ABNORMAL LOW (ref 3.5–5.0)
Anion gap: 15 (ref 5–15)
BUN: 82 mg/dL — ABNORMAL HIGH (ref 8–23)
CHLORIDE: 99 mmol/L (ref 98–111)
CO2: 22 mmol/L (ref 22–32)
CREATININE: 8.96 mg/dL — AB (ref 0.61–1.24)
Calcium: 8.5 mg/dL — ABNORMAL LOW (ref 8.9–10.3)
GFR, EST AFRICAN AMERICAN: 6 mL/min — AB (ref >60)
GFR, EST NON AFRICAN AMERICAN: 6 mL/min — AB (ref >60)
Glucose, Bld: 111 mg/dL — ABNORMAL HIGH (ref 70–99)
POTASSIUM: 5.1 mmol/L (ref 3.5–5.1)
Phosphorus: 6.2 mg/dL — ABNORMAL HIGH (ref 2.5–4.6)
Sodium: 136 mmol/L (ref 135–145)

## 2018-01-07 LAB — C3 COMPLEMENT: C3 COMPLEMENT: 127 mg/dL (ref 82–167)

## 2018-01-07 LAB — CBC
HEMATOCRIT: 22 % — AB (ref 39.0–52.0)
Hemoglobin: 7.3 g/dL — ABNORMAL LOW (ref 13.0–17.0)
MCH: 27.5 pg (ref 26.0–34.0)
MCHC: 33.2 g/dL (ref 30.0–36.0)
MCV: 83 fL (ref 78.0–100.0)
PLATELETS: 357 10*3/uL (ref 150–400)
RBC: 2.65 MIL/uL — AB (ref 4.22–5.81)
RDW: 15.4 % (ref 11.5–15.5)
WBC: 18.3 10*3/uL — ABNORMAL HIGH (ref 4.0–10.5)

## 2018-01-07 LAB — GLUCOSE, CAPILLARY
GLUCOSE-CAPILLARY: 123 mg/dL — AB (ref 70–99)
GLUCOSE-CAPILLARY: 128 mg/dL — AB (ref 70–99)
Glucose-Capillary: 118 mg/dL — ABNORMAL HIGH (ref 70–99)
Glucose-Capillary: 134 mg/dL — ABNORMAL HIGH (ref 70–99)

## 2018-01-07 LAB — HEPATITIS PANEL, ACUTE
HCV Ab: <0.1 s/co ratio (ref 0.0–0.9)
Hep A IgM: NEGATIVE
Hep B C IgM: NEGATIVE
Hepatitis B Surface Ag: NEGATIVE

## 2018-01-07 LAB — C4 COMPLEMENT: Complement C4, Body Fluid: 22 mg/dL (ref 14–44)

## 2018-01-07 MED ORDER — LIDOCAINE HCL (PF) 1 % IJ SOLN
INTRAMUSCULAR | Status: DC | PRN
  Administered 2018-01-07: 2 mL

## 2018-01-07 MED ORDER — CHLORHEXIDINE GLUCONATE CLOTH 2 % EX PADS
6.0000 | MEDICATED_PAD | Freq: Every day | CUTANEOUS | Status: DC
  Administered 2018-01-07 – 2018-01-15 (×7): 6 via TOPICAL

## 2018-01-07 MED ORDER — ALBUMIN HUMAN 25 % IV SOLN
25.0000 g | Freq: Once | INTRAVENOUS | Status: AC
  Administered 2018-01-07: 25 g via INTRAVENOUS
  Filled 2018-01-07: qty 100

## 2018-01-07 MED ORDER — HEPARIN SODIUM (PORCINE) 1000 UNIT/ML IJ SOLN
INTRAMUSCULAR | Status: AC
  Filled 2018-01-07: qty 1

## 2018-01-07 MED ORDER — LIDOCAINE HCL 1 % IJ SOLN
INTRAMUSCULAR | Status: AC
  Filled 2018-01-07: qty 20

## 2018-01-07 MED ORDER — ACETAMINOPHEN 650 MG RE SUPP
650.0000 mg | RECTAL | Status: DC | PRN
  Administered 2018-01-08: 650 mg via RECTAL
  Filled 2018-01-07 (×2): qty 1

## 2018-01-07 MED ORDER — HEPARIN SODIUM (PORCINE) 1000 UNIT/ML IJ SOLN
INTRAMUSCULAR | Status: DC | PRN
  Administered 2018-01-07: 2800 [IU] via INTRAVENOUS

## 2018-01-07 NOTE — Care Management Note (Addendum)
Case Management Note  Patient Details  Name: Joel Hebert MRN: [id number] Date of Birth: 05/29/1956  Subjective/Objective:             Admitted with sepsis, hx of HTN, HLD, Type II DM, CAD, PAD, and recent transmetatarsal amputation of the left foot.       6/28 s/p LBA   Pt with worsening renal failure, hospital course.     Action/Plan:  IR to place HD cath.....   Transition to SNF when medically stable. CSW managing disposition to facility.Marland Kitchen.Marland Kitchen.NCM will continue to monitor.  Expected Discharge Date:                  Expected Discharge Plan:  Skilled Nursing Facility  In-House Referral:  Clinical Social Work  Discharge planning Services  CM Consult  Post Acute Care Choice:    Choice offered to:  Patient  DME Arranged:    DME Agency:     HH Arranged:    HH Agency:     Status of Service:  In process, will continue to follow  If discussed at Long Length of Stay Meetings, dates discussed:    Additional Comments:  Joel Hebert, Joel Strout Hudson, RN 01/07/2018, 12:53 PM

## 2018-01-07 NOTE — Progress Notes (Signed)
Paged Irena CordsJoseph A. Coladonato, MD to make aware patient has had a few bites of apple sauce with medications this morning.

## 2018-01-07 NOTE — Progress Notes (Signed)
PT Cancellation Note  Patient Details Name: Bronson CurbMelvin Robert J Wakeland MRN: [id number] DOB: 06/02/1956   Cancelled Treatment:    Reason Eval/Treat Not Completed: Medical issues which prohibited therapy(pt with Hgb 7.3)   Stephania FragminCaitlin E Latonya Nelon 01/07/2018, 11:58 AM

## 2018-01-07 NOTE — Procedures (Signed)
Pre-procedure Diagnosis: ESRD Post-procedure Diagnosis: Same  Successful placement of a non-tunneled HD catheter with tips terminating within the superior aspect of the right atrium.    Complications: None Immediate  EBL: Minimal   The catheter is ready for immediate use.   Jay Brealyn Baril, MD Pager #: [phone]   

## 2018-01-07 NOTE — Progress Notes (Signed)
   01/07/18 1831  Vitals  Temp (!) 100.6 F (38.1 C)  Temp Source Rectal  BP (!) 151/72  MAP (mmHg) 96  Pulse Rate 94  ECG Heart Rate 96  Resp 20  Oxygen Therapy  SpO2 93 %  Paged provider to make aware and get order for rectal tylenol.

## 2018-01-07 NOTE — Progress Notes (Signed)
   [name]  Assessment and Plan: Patient in need of venous access.   Temporary dialysis catheter placement  ______________________________________________________________________   History of Present Illness: Joel Hebert is a 62 y.o. male   [place] Delirious Worsening renal failure  Request for temp cath asap    Allergies and medications reviewed.   Review of Systems: A 12 point ROS discussed and pertinent positives are indicated in the HPI above.  All other systems are negative.   Vital Signs: BP (!) 161/75   Pulse 93   Temp 98.8 F (37.1 C) (Oral)   Resp 19   Ht 5\' 8"  (1.727 m)   Wt 208 lb 5.4 oz (94.5 kg)   SpO2 93%   BMI 31.68 kg/m   Physical Exam  Cardiovascular: Normal rate and regular rhythm.  Pulmonary/Chest: Effort normal.  Abdominal: Soft.  Musculoskeletal:  Moving all 4s Does not follow commands   Neurological:  Awake Delirious Shaking   Skin: Skin is warm.  Psychiatric:  Consented with wife via phone  Nursing note and vitals reviewed.    Imaging reviewed.   Labs:  COAGS: No results for input(s): INR, APTT in the last 8760 hours.  BMP: Recent Labs    01/05/18 0459 01/06/18 1030 01/06/18 1624 01/07/18 0505  NA 136 136 135 136  K 5.2* 5.3* 5.3* 5.1  CL 107 100 101 99  CO2 21* 22 22 22   GLUCOSE 82 110* 122* 111*  BUN 63* 69* 72* 82*  CALCIUM 8.4* 8.2* 8.3* 8.5*  CREATININE 7.76* 8.64* 8.77* 8.96*  GFRNONAA 7* 6* 6* 6*  GFRAA 8* 7* 7* 6*       Electronically Signed: Khalin Royce A, PA-C 01/07/2018, 10:17 AM   I spent a total of 15 minutes in face to face in clinical consultation, greater than 50% of which was counseling/coordinating care for venous access.Patient ID: Joel Hebert, male   DOB: 10/08/1955, 62 y.o.   MRN: [id number]

## 2018-01-07 NOTE — Progress Notes (Signed)
   01/07/18 1448  Vitals  BP (!) 157/76  MAP (mmHg) 100  ECG Heart Rate 90  Resp 14  Oxygen Therapy  SpO2 93 %  O2 Device Nasal Cannula  O2 Flow Rate (L/min) 2 L/min  Pulse Oximetry Type Continuous  Pain Assessment  Pain Scale Faces  Faces Pain Scale 0    01/07/18 1458  Neurological  Neuro (WDL) X  Level of Consciousness Responds to Voice  Orientation Level Disoriented X4  Cognition Poor attention/concentration;Poor judgement;Poor safety awareness;Memory impairment;Unable to follow commands  Speech Incomprehensible  R Pupil Size (mm) 4  R Pupil Shape Round  R Pupil Reaction Brisk  L Pupil Size (mm) 4  L Pupil Shape Round  L Pupil Reaction Brisk  Respiratory  Respiratory (WDL) X  Respiratory Pattern Regular  Chest Assessment Chest expansion symmetrical  Bilateral Breath Sounds Rhonchi;Diminished  R Upper  Breath Sounds Rhonchi  L Upper Breath Sounds Rhonchi  R Lower Breath Sounds Diminished  L Lower Breath Sounds Diminished   Paged provider to make aware. Provider came bedside to see patient.

## 2018-01-07 NOTE — Progress Notes (Signed)
Patient transported to dialysis with nurse accompany. Update given to dialysis nurse.

## 2018-01-07 NOTE — Progress Notes (Signed)
S: pt is awake but delirious.  Unable to answer simple questions or follow simple commands O:BP (!) 161/75   Pulse 93   Temp 98.8 F (37.1 C) (Oral)   Resp 19   Ht 5\' 8"  (1.727 m)   Wt 94.5 kg (208 lb 5.4 oz)   SpO2 93%   BMI 31.68 kg/m   Intake/Output Summary (Last 24 hours) at 01/07/2018 0940 Last data filed at 01/07/2018 0649 Gross per 24 hour  Intake 2005.82 ml  Output 1000 ml  Net 1005.82 ml   Intake/Output: I/O last 3 completed shifts: In: 3618.1 [I.V.:3018.1; IV Piggyback:600] Out: 1250 [Urine:1250]  Intake/Output this shift:  No intake/output data recorded. Weight change:  Gen: AMS, with some asterixis of right arm CVS: no rub Resp: cta Abd: benign Ext: s/p LBKA, trace edema  Recent Labs  Lab 01/03/18 1056  01/03/18 2348 01/04/18 0649 01/04/18 1656 01/05/18 0459 01/06/18 1030 01/06/18 1624 01/07/18 0505  NA 134*   < > 134* 134* 135 136 136 135 136  K 5.2*   < > 4.9 5.5* 5.9* 5.2* 5.3* 5.3* 5.1  CL 98   < > 103 102 108 107 100 101 99  CO2 22   < > 21* 18* 19* 21* 22 22 22   GLUCOSE 154*   < > 126* 123* 124* 82 110* 122* 111*  BUN 56*   < > 57* 58* 61* 63* 69* 72* 82*  CREATININE 6.29*   < > 6.54* 6.64* 7.23* 7.76* 8.64* 8.77* 8.96*  ALBUMIN 2.2*  --   --   --   --   --  1.6*  --  2.3*  CALCIUM 9.3   < > 8.4* 8.6* 8.1* 8.4* 8.2* 8.3* 8.5*  PHOS  --   --   --   --   --   --  6.4*  --  6.2*  AST 39  --   --   --   --   --   --   --   --   ALT 25  --   --   --   --   --   --   --   --    < > = values in this interval not displayed.   Liver Function Tests: Recent Labs  Lab 01/03/18 1056 01/06/18 1030 01/07/18 0505  AST 39  --   --   ALT 25  --   --   ALKPHOS 195*  --   --   BILITOT 0.3  --   --   PROT 6.6  --   --   ALBUMIN 2.2* 1.6* 2.3*   No results for input(s): LIPASE, AMYLASE in the last 168 hours. No results for input(s): AMMONIA in the last 168 hours. CBC: Recent Labs  Lab 01/03/18 1056  01/04/18 0649 01/04/18 1656 01/05/18 0459  01/06/18 0701 01/07/18 0505  WBC 36.2*   < > 32.9* 34.9* 25.0* 23.4* 18.3*  NEUTROABS 31.8*  --   --   --   --   --   --   HGB 6.8*   < > 8.5* 6.6* 7.7* 7.5* 7.3*  HCT 21.9*   < > 27.8* 21.0* 23.5* 22.9* 22.0*  MCV 86.6   < > 87.7 86.8 86.1 84.2 83.0  PLT 452*   < > 417* 351 317 380 357   < > = values in this interval not displayed.   Cardiac Enzymes: Recent Labs  Lab 01/05/18 0459  CKTOTAL 910*  CBG: Recent Labs  Lab 01/06/18 0817 01/06/18 1202 01/06/18 1706 01/06/18 2122 01/07/18 0758  GLUCAP 123* 127* 122* 117* 123*    Iron Studies: No results for input(s): IRON, TIBC, TRANSFERRIN, FERRITIN in the last 72 hours. Studies/Results: No results found. Marland Kitchen. amLODipine  5 mg Oral Daily  . aspirin EC  81 mg Oral BID  . clopidogrel  75 mg Oral Q breakfast  . docusate sodium  100 mg Oral BID  . gabapentin  300 mg Oral QHS  . insulin aspart  0-5 Units Subcutaneous QHS  . insulin aspart  0-9 Units Subcutaneous TID WC  . mouth rinse  15 mL Mouth Rinse BID  . metoprolol tartrate  100 mg Oral BID  . rosuvastatin  20 mg Oral QPM  . vitamin B-12  1,000 mcg Oral Daily    BMET    Component Value Date/Time   NA 136 01/07/2018 0505   K 5.1 01/07/2018 0505   CL 99 01/07/2018 0505   CO2 22 01/07/2018 0505   GLUCOSE 111 (H) 01/07/2018 0505   BUN 82 (H) 01/07/2018 0505   CREATININE 8.96 (H) 01/07/2018 0505   CALCIUM 8.5 (L) 01/07/2018 0505   GFRNONAA 6 (L) 01/07/2018 0505   GFRAA 6 (L) 01/07/2018 0505   CBC    Component Value Date/Time   WBC 18.3 (H) 01/07/2018 0505   RBC 2.65 (L) 01/07/2018 0505   HGB 7.3 (L) 01/07/2018 0505   HCT 22.0 (L) 01/07/2018 0505   PLT 357 01/07/2018 0505   MCV 83.0 01/07/2018 0505   MCH 27.5 01/07/2018 0505   MCHC 33.2 01/07/2018 0505   RDW 15.4 01/07/2018 0505   LYMPHSABS 1.8 01/03/2018 1056   MONOABS 2.2 (H) 01/03/2018 1056   EOSABS 0.4 01/03/2018 1056   BASOSABS 0.0 01/03/2018 1056     Assessment/Plan:  1. AKI/CKD stage 3-  non-oliguric with multiple renal insults including ischemic ATN in setting of hypotension/ABLA, NSAIDs, ACE-I and diuretics.  Pt with worsening BUN/Cr as well as AMS and some asterixis.  Will ask IR to place temporary HD catheter and initiate HD today after placement.  Will plan on serial HD and follow mental status and renal function.  Good UOP. 2. ABLA- s/p BKA, transfuse if continues to drop 3. PVD s/p LBKA 01/04/18- per Dr. Lajoyce Cornersuda 4. HTN- will follow after HD 5. AMS- presumably combination of anesthesia and uremia.  Plan for HD and follow.  Will also keep him npo for now given pre-procedure and high risk for aspiration. 6. Hyperkalemia- as above, plan for HD  Irena CordsJoseph A. Markan Cazarez, MD [facility] [phone]

## 2018-01-07 NOTE — Progress Notes (Signed)
Patient ID: Joel Hebert, male   DOB: 12/19/1955, 62 y.o.   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  The wound VAC canister has no drainage.  Anticipate patient can be discharged to skilled nursing or home with the portable Praveena wound VAC pump.

## 2018-01-07 NOTE — Progress Notes (Addendum)
   Subjective: Mr. Whitney PostLogan opens eyes to voice and says " Hey, how is it going? " He is not able to communicate about his symptoms at this time   Objective:  Vital signs in last 24 hours: Vitals:   01/07/18 1643 01/07/18 1706 01/07/18 1810 01/07/18 1831  BP:  (!) 166/87  (!) 151/72  Pulse: 90 92  94  Resp: 17 20  20   Temp:   100.3 F (37.9 C) (!) 100.6 F (38.1 C)  TempSrc:   Axillary Rectal  SpO2: 98% 96%  93%  Weight:      Height:       General: chronically ill appearing, remains lethargic appearing Cardiac: regular rate and rhythm, normal S1 and S2, no murmur appreciated, no peripheral edema appreciated. There is an eschar at the tip of the right great toe  Pulm: rhonchorous breath sounds especially over the right lung fields, normal work of breathing, no wheezing  Assessment/Plan:  Active Problems:   Sepsis (HCC)   Acute renal failure (HCC)  Fever  Elevated temperature this evening 100.6, this is new. On exam he has rhonchorus breath sounds and given his sedation there is concern for aspiration pneumonia  - obtain chest xray  - based on chest xray results will consider starting unasyn  - on cefepime 6/27>>   Acute Renal Failureon CKD III. Unclear etiology,possibly due to sepsisand NSAID use in the setting of lisinopril and HCTZ. Creatinine, BUN and uremic symptoms continue to worsen today. He is in need of urgent dialysis.  - went for non tunneled HD catheter placement today  - nephrology following, we appreciate their recommendations   Wet Gangrene of Left Transmetatarsal Stump: - POD #3 after below knee amputation, wound vac in place  - Pain control with OxyIR and Dilaudid as needed. - PT recommending SNF placement once ready for discharge.  Hyperkalemia.   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 Received 1 unit of RBCs early this admission.  - Hemoglobin  stable today  -Continue B12 supplement.  LKG:MWNUUHTN:Blood pressure remains elevated  -Continuemetop 100mg  BID and amlodipine 5 mg daily - Planning for HD today   Type II DM:  -CBG between [id number] over last 24-hour -Continue SSI-sensitive TID AC + QHS  PVD: -- Continue ASA &Plavix  -- Holding pentoxifylline in the setting of renal failure  Dispo: Anticipated discharge in approximately 3-5 day(s).   Eulah PontBlum, Kawthar Ennen, MD 01/07/2018, 7:08 PM Pager: [phone]

## 2018-01-08 DIAGNOSIS — R410 Disorientation, unspecified: Secondary | ICD-10-CM

## 2018-01-08 DIAGNOSIS — N19 Unspecified kidney failure: Secondary | ICD-10-CM

## 2018-01-08 DIAGNOSIS — R278 Other lack of coordination: Secondary | ICD-10-CM

## 2018-01-08 DIAGNOSIS — G9340 Encephalopathy, unspecified: Secondary | ICD-10-CM

## 2018-01-08 LAB — ANA W/REFLEX IF POSITIVE: ANA: NEGATIVE

## 2018-01-08 LAB — RENAL FUNCTION PANEL
ALBUMIN: 2.4 g/dL — AB (ref 3.5–5.0)
Anion gap: 19 — ABNORMAL HIGH (ref 5–15)
BUN: 58 mg/dL — ABNORMAL HIGH (ref 8–23)
CALCIUM: 8.6 mg/dL — AB (ref 8.9–10.3)
CO2: 22 mmol/L (ref 22–32)
CREATININE: 6.91 mg/dL — AB (ref 0.61–1.24)
Chloride: 99 mmol/L (ref 98–111)
GFR calc Af Amer: 9 mL/min — ABNORMAL LOW (ref >60)
GFR, EST NON AFRICAN AMERICAN: 8 mL/min — AB (ref >60)
GLUCOSE: 131 mg/dL — AB (ref 70–99)
Phosphorus: 4.7 mg/dL — ABNORMAL HIGH (ref 2.5–4.6)
Potassium: 4.3 mmol/L (ref 3.5–5.1)
Sodium: 140 mmol/L (ref 135–145)

## 2018-01-08 LAB — CBC
HCT: 23.9 % — ABNORMAL LOW (ref 39.0–52.0)
Hemoglobin: 7.6 g/dL — ABNORMAL LOW (ref 13.0–17.0)
MCH: 27 pg (ref 26.0–34.0)
MCHC: 31.8 g/dL (ref 30.0–36.0)
MCV: 84.8 fL (ref 78.0–100.0)
PLATELETS: 274 10*3/uL (ref 150–400)
RBC: 2.82 MIL/uL — ABNORMAL LOW (ref 4.22–5.81)
RDW: 15.6 % — AB (ref 11.5–15.5)
WBC: 20.9 10*3/uL — ABNORMAL HIGH (ref 4.0–10.5)

## 2018-01-08 LAB — CULTURE, BLOOD (ROUTINE X 2)
Culture: NO GROWTH
Culture: NO GROWTH

## 2018-01-08 LAB — HEPATITIS B SURFACE ANTIGEN: HEP B S AG: NEGATIVE

## 2018-01-08 LAB — GLUCOSE, CAPILLARY
GLUCOSE-CAPILLARY: 113 mg/dL — AB (ref 70–99)
Glucose-Capillary: 140 mg/dL — ABNORMAL HIGH (ref 70–99)
Glucose-Capillary: 124 mg/dL — ABNORMAL HIGH (ref 70–99)

## 2018-01-08 LAB — HEPATITIS B SURFACE ANTIBODY,QUALITATIVE: Hep B S Ab: NONREACTIVE

## 2018-01-08 MED ORDER — METOPROLOL TARTRATE 5 MG/5ML IV SOLN: 5.0000 mg | Freq: Four times a day (QID) | INTRAVENOUS | Status: DC | PRN

## 2018-01-08 MED ORDER — METOPROLOL TARTRATE 5 MG/5ML IV SOLN
5.0000 mg | Freq: Once | INTRAVENOUS | Status: AC
  Administered 2018-01-08: 5 mg via INTRAVENOUS
  Filled 2018-01-08: qty 5

## 2018-01-08 NOTE — Progress Notes (Signed)
S:More awake and alert today but still a little confused. O:BP (!) 177/96   Pulse 99   Temp 98.1 F (36.7 C) (Oral)   Resp 19   Ht '5\' 8"'$  (1.727 m)   Wt 96.4 kg (212 lb 8.4 oz)   SpO2 99%   BMI 32.31 kg/m   Intake/Output Summary (Last 24 hours) at 01/08/2018 1257 Last data filed at 01/08/2018 0547 Gross per 24 hour  Intake 1006.12 ml  Output 875 ml  Net 131.12 ml   Intake/Output: I/O last 3 completed shifts: In: 3671.2 [I.V.:3271.2; IV Piggyback:400] Out: 1875 [LTJQZ:0092]  Intake/Output this shift:  No intake/output data recorded. Weight change: -3.885 kg (-8 lb 9 oz) Gen:NAD CVS: no rub Resp: scattered rhonchi Abd: benign Ext: trace edema  Recent Labs  Lab 01/03/18 1056  01/04/18 0649 01/04/18 1656 01/05/18 0459 01/06/18 1030 01/06/18 1624 01/07/18 0505 01/08/18 0459  NA 134*   < > 134* 135 136 136 135 136 140  K 5.2*   < > 5.5* 5.9* 5.2* 5.3* 5.3* 5.1 4.3  CL 98   < > 102 108 107 100 101 99 99  CO2 22   < > 18* 19* 21* '22 22 22 22  '$ GLUCOSE 154*   < > 123* 124* 82 110* 122* 111* 131*  BUN 56*   < > 58* 61* 63* 69* 72* 82* 58*  CREATININE 6.29*   < > 6.64* 7.23* 7.76* 8.64* 8.77* 8.96* 6.91*  ALBUMIN 2.2*  --   --   --   --  1.6*  --  2.3* 2.4*  CALCIUM 9.3   < > 8.6* 8.1* 8.4* 8.2* 8.3* 8.5* 8.6*  PHOS  --   --   --   --   --  6.4*  --  6.2* 4.7*  AST 39  --   --   --   --   --   --   --   --   ALT 25  --   --   --   --   --   --   --   --    < > = values in this interval not displayed.   Liver Function Tests: Recent Labs  Lab 01/03/18 1056 01/06/18 1030 01/07/18 0505 01/08/18 0459  AST 39  --   --   --   ALT 25  --   --   --   ALKPHOS 195*  --   --   --   BILITOT 0.3  --   --   --   PROT 6.6  --   --   --   ALBUMIN 2.2* 1.6* 2.3* 2.4*   No results for input(s): LIPASE, AMYLASE in the last 168 hours. No results for input(s): AMMONIA in the last 168 hours. CBC: Recent Labs  Lab 01/03/18 1056  01/04/18 1656 01/05/18 0459 01/06/18 0701  01/07/18 0505 01/08/18 0459  WBC 36.2*   < > 34.9* 25.0* 23.4* 18.3* 20.9*  NEUTROABS 31.8*  --   --   --   --   --   --   HGB 6.8*   < > 6.6* 7.7* 7.5* 7.3* 7.6*  HCT 21.9*   < > 21.0* 23.5* 22.9* 22.0* 23.9*  MCV 86.6   < > 86.8 86.1 84.2 83.0 84.8  PLT 452*   < > 351 317 380 357 274   < > = values in this interval not displayed.   Cardiac Enzymes: Recent Labs  Lab 01/05/18  [place]*   CBG: Recent Labs  Lab 01/07/18 1135 01/07/18 1822 01/07/18 2315 01/08/18 0755 01/08/18 1215  GLUCAP 118* 128* 134* 140* 124*    Iron Studies: No results for input(s): IRON, TIBC, TRANSFERRIN, FERRITIN in the last 72 hours. Studies/Results: Ir Fluoro Guide Cv Line Right  Result Date: 01/07/2018 INDICATION: End-stage renal disease, in need of placement of a temporary dialysis catheter for the urgent initiation of dialysis. EXAM: NON-TUNNELED CENTRAL VENOUS HEMODIALYSIS CATHETER PLACEMENT WITH ULTRASOUND AND FLUOROSCOPIC GUIDANCE COMPARISON:  None. MEDICATIONS: None FLUOROSCOPY TIME:  18 seconds (3  mGy) COMPLICATIONS: None immediate. PROCEDURE: Informed written consent was obtained from the patient after a discussion of the risks, benefits, and alternatives to treatment. Questions regarding the procedure were encouraged and answered. The right neck and chest were prepped with chlorhexidine in a sterile fashion, and a sterile drape was applied covering the operative field. Maximum barrier sterile technique with sterile gowns and gloves were used for the procedure. A timeout was performed prior to the initiation of the procedure. After the overlying soft tissues were anesthetized, a small venotomy incision was created and a micropuncture kit was utilized to access the internal jugular vein. Real-time ultrasound guidance was utilized for vascular access including the acquisition of a permanent ultrasound image documenting patency of the accessed vessel. The microwire was utilized to measure  appropriate catheter length. A stiff glidewire was advanced to the level of the IVC. Under fluoroscopic guidance, the venotomy was serially dilated, ultimately allowing placement of a 20 cm temporary Trialysis catheter with tip ultimately terminating within the superior aspect of the right atrium. Final catheter positioning was confirmed and documented with a spot radiographic image. The catheter aspirates and flushes normally. The catheter was flushed with appropriate volume heparin dwells. The catheter exit site was secured with a 0-Prolene retention suture. A dressing was placed. The patient tolerated the procedure well without immediate post procedural complication. IMPRESSION: Successful placement of a right internal jugular approach 20 cm temporary dialysis catheter with tip terminating with in the superior aspect of the right atrium. The catheter is ready for immediate use. PLAN: This catheter may be converted to a tunneled dialysis catheter at a later date as indicated. Electronically Signed   By: Sandi Mariscal M.D.   On: 01/07/2018 13:06   Ir US Guide Vasc Access Right  Result Date: 01/07/2018 INDICATION: End-stage renal disease, in need of placement of a temporary dialysis catheter for the urgent initiation of dialysis. EXAM: NON-TUNNELED CENTRAL VENOUS HEMODIALYSIS CATHETER PLACEMENT WITH ULTRASOUND AND FLUOROSCOPIC GUIDANCE COMPARISON:  None. MEDICATIONS: None FLUOROSCOPY TIME:  18 seconds (3  mGy) COMPLICATIONS: None immediate. PROCEDURE: Informed written consent was obtained from the patient after a discussion of the risks, benefits, and alternatives to treatment. Questions regarding the procedure were encouraged and answered. The right neck and chest were prepped with chlorhexidine in a sterile fashion, and a sterile drape was applied covering the operative field. Maximum barrier sterile technique with sterile gowns and gloves were used for the procedure. A timeout was performed prior to the  initiation of the procedure. After the overlying soft tissues were anesthetized, a small venotomy incision was created and a micropuncture kit was utilized to access the internal jugular vein. Real-time ultrasound guidance was utilized for vascular access including the acquisition of a permanent ultrasound image documenting patency of the accessed vessel. The microwire was utilized to measure appropriate catheter length. A stiff glidewire was advanced to the level of the IVC. Under fluoroscopic  guidance, the venotomy was serially dilated, ultimately allowing placement of a 20 cm temporary Trialysis catheter with tip ultimately terminating within the superior aspect of the right atrium. Final catheter positioning was confirmed and documented with a spot radiographic image. The catheter aspirates and flushes normally. The catheter was flushed with appropriate volume heparin dwells. The catheter exit site was secured with a 0-Prolene retention suture. A dressing was placed. The patient tolerated the procedure well without immediate post procedural complication. IMPRESSION: Successful placement of a right internal jugular approach 20 cm temporary dialysis catheter with tip terminating with in the superior aspect of the right atrium. The catheter is ready for immediate use. PLAN: This catheter may be converted to a tunneled dialysis catheter at a later date as indicated. Electronically Signed   By: Sandi Mariscal M.D.   On: 01/07/2018 13:06   Dg Chest Port 1 View  Result Date: 01/07/2018 CLINICAL DATA:  Fever EXAM: PORTABLE CHEST 1 VIEW COMPARISON:  01/03/2018 FINDINGS: Development of small pleural effusions. Cardiomegaly with vascular congestion and diffuse interstitial edema, increased compared to prior. Focal airspace disease at the bases. Aortic atherosclerosis. No pneumothorax. Right IJ central venous catheter tip overlies the right atrium. IMPRESSION: 1. Cardiomegaly with vascular congestion and development of  moderate pulmonary edema and small pleural effusion. 2. Bibasilar airspace disease, left greater than right, atelectasis versus pneumonia 3. New right IJ central venous catheter with tip projecting over the right atrium Electronically Signed   By: Donavan Foil M.D.   On: 01/07/2018 21:14   . amLODipine  5 mg Oral Daily  . aspirin EC  81 mg Oral BID  . Chlorhexidine Gluconate Cloth  6 each Topical Q0600  . clopidogrel  75 mg Oral Q breakfast  . docusate sodium  100 mg Oral BID  . gabapentin  300 mg Oral QHS  . insulin aspart  0-5 Units Subcutaneous QHS  . insulin aspart  0-9 Units Subcutaneous TID WC  . mouth rinse  15 mL Mouth Rinse BID  . metoprolol tartrate  100 mg Oral BID  . rosuvastatin  20 mg Oral QPM  . vitamin B-12  1,000 mcg Oral Daily    BMET    Component Value Date/Time   NA 140 01/08/2018 0459   K 4.3 01/08/2018 0459   CL 99 01/08/2018 0459   CO2 22 01/08/2018 0459   GLUCOSE 131 (H) 01/08/2018 0459   BUN 58 (H) 01/08/2018 0459   CREATININE 6.91 (H) 01/08/2018 0459   CALCIUM 8.6 (L) 01/08/2018 0459   GFRNONAA 8 (L) 01/08/2018 0459   GFRAA 9 (L) 01/08/2018 0459   CBC    Component Value Date/Time   WBC 20.9 (H) 01/08/2018 0459   RBC 2.82 (L) 01/08/2018 0459   HGB 7.6 (L) 01/08/2018 0459   HCT 23.9 (L) 01/08/2018 0459   PLT 274 01/08/2018 0459   MCV 84.8 01/08/2018 0459   MCH 27.0 01/08/2018 0459   MCHC 31.8 01/08/2018 0459   RDW 15.6 (H) 01/08/2018 0459   LYMPHSABS 1.8 01/03/2018 1056   MONOABS 2.2 (H) 01/03/2018 1056   EOSABS 0.4 01/03/2018 1056   BASOSABS 0.0 01/03/2018 1056    Assessment/Plan:  1. AKI/CKD stage 3- non-oliguric with multiple renal insults including ischemic ATN in setting of hypotension/ABLA, NSAIDs, ACE-I and diuretics.  Pt with worsening BUN/Cr as well as AMS and some asterixis.   1. S/p temporary HD cath by IR on 01/07/18 and first HD session 2. Marked improvement in MS after HD and  plan for another session today. 3. Good UOP and  hopefully will see improvement in BUN/Cr between HD sessions soon. 2. ABLA- s/p BKA, transfuse if continues to drop 3. PVD s/p LBKA 01/04/18- per Dr. Sharol Given 4. HTN- will follow after HD 5. AMS- presumably combination of anesthesia and uremia.  Plan for HD and follow.  Has shown some improvement since his HD session 6. Hyperkalemia- as above, plan for HD    Donetta Potts, MD [facility] [phone]

## 2018-01-08 NOTE — Progress Notes (Signed)
Physical Therapy Treatment Patient Details Name: Joel Hebert MRN: [id number] DOB: 11/15/1955 Today's Date: 01/08/2018    History of Present Illness Pt is a 62 y.o. 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 PMH includes initial L 5th toe amp 10/09/17,  DMII, PAD, PVD, MI, HTN.    PT Comments    Patient required max A +2 for bed mobility this session and transfer deferred for patient safety due to restlessness and impaired cognition. Pt able to maintain sitting balance EOB for >15 mins grossly with min/min guard assist. Pt followed commands inconsistently and tends to answer yes to most questions. Oriented to person only. Continue to progress as tolerated with anticipated d/c to SNF for further skilled PT services.     Follow Up Recommendations  SNF;Supervision for mobility/OOB     Equipment Recommendations  (TBD next venue)    Recommendations for Other Services       Precautions / Restrictions Precautions Precautions: Fall;Other (comment) Precaution Comments: L BKA wound vac Restrictions Weight Bearing Restrictions: Yes [name] Weight Bearing: Non weight bearing    Mobility  Bed Mobility Overal bed mobility: Needs Assistance Bed Mobility: Supine to Sit;Sit to Supine     Supine to sit: Max assist Sit to supine: Max assist;+2 for physical assistance   General bed mobility comments: multimodal cues for sequencing; assist for all aspects of bed mobility  Transfers                 General transfer comment: deferred for patient/therapist safety  Ambulation/Gait                 Stairs             Wheelchair Mobility    Modified Rankin (Stroke Patients Only)       Balance Overall balance assessment: Needs assistance   Sitting balance-Leahy Scale: Poor Sitting balance - Comments: initially pt leaing posteriorly and required max A  to sit EOB but progressed to min guard for safety for balance EOB                                    Cognition Arousal/Alertness: Awake/alert Behavior During Therapy: Flat affect Overall Cognitive Status: Impaired/Different from baseline Area of Impairment: Orientation;Attention;Following commands;Problem solving;Safety/judgement;Awareness                 Orientation Level: Disoriented to;Place;Time;Situation Current Attention Level: Focused   Following Commands: Follows one step commands inconsistently Safety/Judgement: Decreased awareness of safety;Decreased awareness of deficits Awareness: Intellectual Problem Solving: Slow processing;Decreased initiation;Difficulty sequencing;Requires verbal cues;Requires tactile cues        Exercises General Exercises - Lower Extremity Long Arc Quad: AROM;Both;Seated    General Comments General comments (skin integrity, edema, etc.): hand over hand assist needed for  R hand grip and guidance for hand to mouth for hygiene; pt with limited bilat grip strength and uncoordinated movements; brother and sister in law present throughout session      Pertinent Vitals/Pain Pain Assessment: Faces Faces Pain Scale: No hurt    Home Living                      Prior Function            PT Goals (current goals can now be found in  the care plan section) Acute Rehab PT Goals Patient Stated Goal: decrease pain PT Goal Formulation: Patient unable to participate in goal setting Time For Goal Achievement: 01/19/18 Potential to Achieve Goals: Good Progress towards PT goals: Progressing toward goals    Frequency    Min 3X/week      PT Plan Current plan remains appropriate    Co-evaluation              AM-PAC PT "6 Clicks" Daily Activity  Outcome Measure  Difficulty turning over in bed (including adjusting bedclothes, sheets and blankets)?: Unable Difficulty moving from lying on back to sitting on the  side of the bed? : Unable Difficulty sitting down on and standing up from a chair with arms (e.g., wheelchair, bedside commode, etc,.)?: Unable Help needed moving to and from a bed to chair (including a wheelchair)?: Total Help needed walking in hospital room?: Total Help needed climbing 3-5 steps with a railing? : Total 6 Click Score: 6    End of Session Equipment Utilized During Treatment: Gait belt Activity Tolerance: Other (comment)(cognition limiting participation) Patient left: in bed;with call bell/phone within reach;with bed alarm set;with family/visitor present;with SCD's reapplied Nurse Communication: Mobility status PT Visit Diagnosis: Other abnormalities of gait and mobility (R26.89);Difficulty in walking, not elsewhere classified (R26.2) Pain - Right/Left: Left Pain - part of body: [name]     Time: 1100-1145 PT Time Calculation (min) (ACUTE ONLY): 45 min  Charges:  $Therapeutic Activity: 38-52 mins                    G Codes:       Erline Levine, PTA Pager: [phone]     Carolynne Edouard 01/08/2018, 4:26 PM

## 2018-01-08 NOTE — Progress Notes (Signed)
   Subjective: Visited [name] today. He got HD yesterday. He is more awake than yesterday but still confused. He recognizes his family member today but is not oriented to the place and repeat the same sentence frequently" how are you doing". Had low grade fever and dropped O2 sat last night before HD. CXR performed and was unremarkable for aspiration pneumonia.    Objective:  Vital signs in last 24 hours: Vitals:   01/08/18 1800 01/08/18 1830 01/08/18 1845 01/08/18 1932  BP: (!) 160/64 (!) 185/119 (!) 196/124 (!) 162/126  Pulse: 84 99 81 98  Resp: (!) 25 (!) 24 (!) 22 (!) 23  Temp:   (!) 96.8 F (36 C) 97.9 F (36.6 C)  TempSrc:   Oral Oral  SpO2: 98% 97% 97% 100%  Weight:   (P) 212 lb 15.4 oz (96.6 kg)   Height:       General: Confused but more awake than yesterday Cardiac: Nl S1S2, RRR, no murmure Lungs are clear comparing to yesterday evening Ext: Stiffness on upper extremities particularly at Rt side did not permit a prper exam but some asterixia was appreciated. Has dry ischemic area/gangren at rt big toe. Amputation site in left midtibia is dry with drain tube in place.  Assessment/Plan:  Active problem: 1) Acute renal failure: Unclear ethology. Probably due to sepsis. Baseline Cr was 1 two weeks ago. Patient underwent HD yesterday. Today Cr: 8.9, BUN:82 Nephrology is on board and plan for next HD session today.  2)Acute Encephalopathy: In setting of uremia Improved in some degree today. He is still confused but more awake and oriented to family member today.  -Keep NPO tonight and reevaluate aspiration risk tomorrow  -HD today  3)Wet gangrene and Sepsis s/p amputation, s/p Vanco, cefepime, Metronidazole. (Stopped yesterday). Wound looks good. Afebrile  4)Hyperkalemia:resolved.Today k: 4:3 -Check K daily with renal panel  Dispo: Anticipated discharge in approximately a week day(s).   Joel Hebert, Joel Pietsch, MD 01/08/2018, 8:07 PM Pager: @MYPAGER @

## 2018-01-09 DIAGNOSIS — Z89511 Acquired absence of right leg below knee: Secondary | ICD-10-CM

## 2018-01-09 LAB — GLUCOSE, CAPILLARY
GLUCOSE-CAPILLARY: 114 mg/dL — AB (ref 70–99)
GLUCOSE-CAPILLARY: 107 mg/dL — AB (ref 70–99)
GLUCOSE-CAPILLARY: 104 mg/dL — AB (ref 70–99)
GLUCOSE-CAPILLARY: 134 mg/dL — AB (ref 70–99)
Glucose-Capillary: 99 mg/dL (ref 70–99)

## 2018-01-09 LAB — RENAL FUNCTION PANEL
ANION GAP: 13 (ref 5–15)
Albumin: 2.1 g/dL — ABNORMAL LOW (ref 3.5–5.0)
BUN: 46 mg/dL — AB (ref 8–23)
CHLORIDE: 101 mmol/L (ref 98–111)
CO2: 27 mmol/L (ref 22–32)
Calcium: 8.1 mg/dL — ABNORMAL LOW (ref 8.9–10.3)
Creatinine, Ser: 5.71 mg/dL — ABNORMAL HIGH (ref 0.61–1.24)
GFR calc Af Amer: 11 mL/min — ABNORMAL LOW (ref >60)
GFR, EST NON AFRICAN AMERICAN: 10 mL/min — AB (ref >60)
Glucose, Bld: 105 mg/dL — ABNORMAL HIGH (ref 70–99)
POTASSIUM: 3.6 mmol/L (ref 3.5–5.1)
Phosphorus: 4.8 mg/dL — ABNORMAL HIGH (ref 2.5–4.6)
Sodium: 141 mmol/L (ref 135–145)

## 2018-01-09 LAB — CBC
HCT: 25.1 % — ABNORMAL LOW (ref 39.0–52.0)
HEMOGLOBIN: 8.1 g/dL — AB (ref 13.0–17.0)
MCH: 27.4 pg (ref 26.0–34.0)
MCHC: 32.3 g/dL (ref 30.0–36.0)
MCV: 84.8 fL (ref 78.0–100.0)
Platelets: 212 10*3/uL (ref 150–400)
RBC: 2.96 MIL/uL — ABNORMAL LOW (ref 4.22–5.81)
RDW: 15.1 % (ref 11.5–15.5)
WBC: 15.7 10*3/uL — AB (ref 4.0–10.5)

## 2018-01-09 LAB — HEPATITIS B SURFACE ANTIBODY,QUALITATIVE: HEP B S AB: NONREACTIVE

## 2018-01-09 LAB — HEPATITIS B CORE ANTIBODY, TOTAL: Hep B Core Total Ab: NEGATIVE

## 2018-01-09 LAB — HEPATITIS B SURFACE ANTIGEN: HEP B S AG: NEGATIVE

## 2018-01-09 NOTE — Progress Notes (Signed)
S:No complaints, more awake and oriented to person and place but remains confused O:BP (!) 165/70   Pulse 81   Temp 98.2 F (36.8 C) (Oral)   Resp (!) 21   Ht 5\' 8"  (1.727 m)   Wt 99 kg (218 lb 4.1 oz)   SpO2 97%   BMI 33.19 kg/m   Intake/Output Summary (Last 24 hours) at 01/09/2018 1625 Last data filed at 01/09/2018 1510 Gross per 24 hour  Intake 2121.59 ml  Output 2001 ml  Net 120.59 ml   Intake/Output: I/O last 3 completed shifts: In: 1006.1 [I.V.:1006.1] Out: 2351 [Urine:1350; Other:1001]  Intake/Output this shift:  Total I/O In: 2121.6 [I.V.:2121.6] Out: -  Weight change: 3.3 kg (7 lb 4.4 oz) Gen: NAD CVS: no rub Resp: scattered rhonchi [id number] Ext: 1+ edema  Recent Labs  Lab 01/03/18 1056  01/04/18 1656 01/05/18 0459 01/06/18 1030 01/06/18 1624 01/07/18 0505 01/08/18 0459 01/09/18 0555  NA 134*   < > 135 136 136 135 136 140 141  K 5.2*   < > 5.9* 5.2* 5.3* 5.3* 5.1 4.3 3.6  CL 98   < > 108 107 100 101 99 99 101  CO2 22   < > 19* 21* 22 22 22 22 27   GLUCOSE 154*   < > 124* 82 110* 122* 111* 131* 105*  BUN 56*   < > 61* 63* 69* 72* 82* 58* 46*  CREATININE 6.29*   < > 7.23* 7.76* 8.64* 8.77* 8.96* 6.91* 5.71*  ALBUMIN 2.2*  --   --   --  1.6*  --  2.3* 2.4* 2.1*  CALCIUM 9.3   < > 8.1* 8.4* 8.2* 8.3* 8.5* 8.6* 8.1*  PHOS  --   --   --   --  6.4*  --  6.2* 4.7* 4.8*  AST 39  --   --   --   --   --   --   --   --   ALT 25  --   --   --   --   --   --   --   --    < > = values in this interval not displayed.   Liver Function Tests: Recent Labs  Lab 01/03/18 1056  01/07/18 0505 01/08/18 0459 01/09/18 0555  AST 39  --   --   --   --   ALT 25  --   --   --   --   ALKPHOS 195*  --   --   --   --   BILITOT 0.3  --   --   --   --   PROT 6.6  --   --   --   --   ALBUMIN 2.2*   < > 2.3* 2.4* 2.1*   < > = values in this interval not displayed.   No results for input(s): LIPASE, AMYLASE in the last 168 hours. No results for input(s): AMMONIA in the last  168 hours. CBC: Recent Labs  Lab 01/03/18 1056  01/04/18 1656 01/05/18 0459 01/06/18 0701 01/07/18 0505 01/08/18 0459  WBC 36.2*   < > 34.9* 25.0* 23.4* 18.3* 20.9*  NEUTROABS 31.8*  --   --   --   --   --   --   HGB 6.8*   < > 6.6* 7.7* 7.5* 7.3* 7.6*  HCT 21.9*   < > 21.0* 23.5* 22.9* 22.0* 23.9*  MCV 86.6   < > 86.8 86.1 84.2  83.0 84.8  PLT 452*   < > 351 317 380 357 274   < > = values in this interval not displayed.   Cardiac Enzymes: Recent Labs  Lab 01/05/18 0459  CKTOTAL 910*   CBG: Recent Labs  Lab 01/08/18 1215 01/08/18 2110 01/09/18 0429 01/09/18 0737 01/09/18 1229  GLUCAP 124* 113* 114* 99 107*    Iron Studies: No results for input(s): IRON, TIBC, TRANSFERRIN, FERRITIN in the last 72 hours. Studies/Results: Dg Chest Port 1 View  Result Date: 01/07/2018 CLINICAL DATA:  Fever EXAM: PORTABLE CHEST 1 VIEW COMPARISON:  01/03/2018 FINDINGS: Development of small pleural effusions. Cardiomegaly with vascular congestion and diffuse interstitial edema, increased compared to prior. Focal airspace disease at the bases. Aortic atherosclerosis. No pneumothorax. Right IJ central venous catheter tip overlies the right atrium. IMPRESSION: 1. Cardiomegaly with vascular congestion and development of moderate pulmonary edema and small pleural effusion. 2. Bibasilar airspace disease, left greater than right, atelectasis versus pneumonia 3. New right IJ central venous catheter with tip projecting over the right atrium Electronically Signed   By: Jasmine Pang M.D.   On: 01/07/2018 21:14   . amLODipine  5 mg Oral Daily  . aspirin EC  81 mg Oral BID  . Chlorhexidine Gluconate Cloth  6 each Topical Q0600  . clopidogrel  75 mg Oral Q breakfast  . docusate sodium  100 mg Oral BID  . gabapentin  300 mg Oral QHS  . insulin aspart  0-5 Units Subcutaneous QHS  . insulin aspart  0-9 Units Subcutaneous TID WC  . mouth rinse  15 mL Mouth Rinse BID  . metoprolol tartrate  100 mg Oral BID  .  rosuvastatin  20 mg Oral QPM  . vitamin B-12  1,000 mcg Oral Daily    BMET    Component Value Date/Time   NA 141 01/09/2018 0555   K 3.6 01/09/2018 0555   CL 101 01/09/2018 0555   CO2 27 01/09/2018 0555   GLUCOSE 105 (H) 01/09/2018 0555   BUN 46 (H) 01/09/2018 0555   CREATININE 5.71 (H) 01/09/2018 0555   CALCIUM 8.1 (L) 01/09/2018 0555   GFRNONAA 10 (L) 01/09/2018 0555   GFRAA 11 (L) 01/09/2018 0555   CBC    Component Value Date/Time   WBC 20.9 (H) 01/08/2018 0459   RBC 2.82 (L) 01/08/2018 0459   HGB 7.6 (L) 01/08/2018 0459   HCT 23.9 (L) 01/08/2018 0459   PLT 274 01/08/2018 0459   MCV 84.8 01/08/2018 0459   MCH 27.0 01/08/2018 0459   MCHC 31.8 01/08/2018 0459   RDW 15.6 (H) 01/08/2018 0459   LYMPHSABS 1.8 01/03/2018 1056   MONOABS 2.2 (H) 01/03/2018 1056   EOSABS 0.4 01/03/2018 1056   BASOSABS 0.0 01/03/2018 1056     Assessment/Plan:  1. AKI/CKD stage 3- non-oliguric with multiple renal insults including ischemic ATN in setting of hypotension/ABLA, NSAIDs, ACE-I and diuretics. Pt with worsening BUN/Cr as well as AMS and some asterixis.  1. S/p temporary HD cath by IR on 01/07/18 and first HD session 2. Marked improvement in MS after 2 HD sessions and plan for third tomorrow. 3. Good UOP and hopefully will see improvement in BUN/Cr between HD sessions soon.  Will check in am tomorrow before HD. 2. ABLA- s/p BKA, transfuse if continues to drop 3. PVD s/p LBKA 01/04/18- per Dr. Lajoyce Corners 4. HTN- will follow after HD 5. AMS- presumably combination of anesthesia and uremia. Plan for HD and follow. Has shown some  improvement since his HD session 6. Hyperkalemia- as above, plan for HD 7. Elevated WBC- will need wound checked again.   Irena CordsJoseph A. Dhana Totton, MD [facility] [phone]

## 2018-01-09 NOTE — Evaluation (Signed)
Clinical/Bedside Swallow Evaluation Patient Details  Name: Joel Hebert MRN: [id number] Date of Birth: 03/14/1956  Today's Date: 01/09/2018 Time: SLP Start Time (ACUTE ONLY): 1423 SLP Stop Time (ACUTE ONLY): 1432 SLP Time Calculation (min) (ACUTE ONLY): 9 min  Past Medical History:  Past Medical History:  Diagnosis Date  . Coronary artery disease   . High cholesterol   . Hypertension   . MI (myocardial infarction) (HCC) 08/13/2014; 01/2016- 03/2016 X 3  . PAD (peripheral artery disease) (HCC)   . Peripheral vascular disease (HCC)   . Type II diabetes mellitus (HCC)    Past Surgical History:  Past Surgical History:  Procedure Laterality Date  . ABDOMINAL AORTOGRAM W/LOWER EXTREMITY  08/14/2017  . ABDOMINAL AORTOGRAM W/LOWER EXTREMITY N/A 08/14/2017   Procedure: ABDOMINAL AORTOGRAM W/LOWER EXTREMITY;  Surgeon: Nada LibmanBrabham, Vance W, MD;  Location: [facility];  Service: Cardiovascular;  Laterality: N/A;  . ABDOMINAL AORTOGRAM W/LOWER EXTREMITY N/A 09/05/2017   Procedure: ABDOMINAL AORTOGRAM W/LOWER EXTREMITY;  Surgeon: Maeola Harmanain, Brandon Christopher, MD;  [facility];  Service: Cardiovascular;  Laterality: N/A;  . AMPUTATION Left 10/09/2017   Procedure: AMPUTATION DIGIT LEFT FIFTH TOE;  Surgeon: Maeola Harmanain, Brandon Christopher, MD;  [facility];  Service: Vascular;  Laterality: Left;  . AMPUTATION Left 12/14/2017   Procedure: LEFT TRANSMETATARSAL AMPUTATION;  Surgeon: Nadara Mustarduda, Marcus V, MD;  [facility];  Service: Orthopedics;  Laterality: Left;  . AMPUTATION Left 01/04/2018   Procedure: LEFT BELOW KNEE AMPUTATION;  Surgeon: Nadara Mustarduda, Marcus V, MD;  [facility];  Service: Orthopedics;  Laterality: Left;  . AMPUTATION TOE Left 10/09/2017   left foot 5th toe  . CORONARY ANGIOPLASTY WITH STENT PLACEMENT  01/2016; 03/2016   "1 + 1" (08/14/2017)  . IR FLUORO GUIDE CV LINE RIGHT  01/07/2018  . IR US GUIDE VASC ACCESS RIGHT  01/07/2018  . LOWER EXTREMITY ANGIOGRAPHY N/A 08/20/2017   Procedure: LOWER EXTREMITY ANGIOGRAPHY-rt leg;  Surgeon: Sherren KernsFields, Charles E, MD;  [facility];  Service: Cardiovascular;  Laterality: N/A;  . PERIPHERAL VASCULAR ATHERECTOMY  08/14/2017   Procedure: PERIPHERAL VASCULAR ATHERECTOMY;  Surgeon: Nada LibmanBrabham, Vance W, MD;  Location: [facility];  Service: Cardiovascular;;  SFA   . PERIPHERAL VASCULAR ATHERECTOMY Left 09/05/2017   Procedure: PERIPHERAL VASCULAR ATHERECTOMY;  Surgeon: Maeola Harmanain, Brandon Christopher, MD;  [facility];  Service: Cardiovascular;  Laterality: Left;  Tibioperoneal and posterior tibial  . PERIPHERAL VASCULAR BALLOON ANGIOPLASTY  08/14/2017   Procedure: PERIPHERAL VASCULAR BALLOON ANGIOPLASTY;  Surgeon: Nada LibmanBrabham, Vance W, MD;  Location: [facility];  Service: Cardiovascular;;  peroneal  . PERIPHERAL VASCULAR BALLOON ANGIOPLASTY  08/20/2017   Procedure: PERIPHERAL VASCULAR BALLOON ANGIOPLASTY;  Surgeon: Sherren KernsFields, Charles E, MD;  Location: [facility];  Service: Cardiovascular;;  . PERIPHERAL VASCULAR INTERVENTION  08/14/2017   Procedure: PERIPHERAL VASCULAR INTERVENTION;  Surgeon: Nada LibmanBrabham, Vance W, MD;  Location: [facility];  Service: Cardiovascular;;  SFA stent  . TONSILLECTOMY    . WOUND DEBRIDEMENT Left 11/06/2017   Procedure: DEBRIDEMENT WOUND TOE BED OF FIFTH TOE LEFT FOOT;  Surgeon: Maeola Harmanain, Brandon Christopher, MD;  [facility];  Service: Vascular;  Laterality: Left;   HPI:  Pt is a 62 y.o. 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 PMH includes initial L 5th toe amp 10/09/17,  DMII, PAD, PVD, MI,  HTN.   Assessment / Plan / Recommendation Clinical Impression  Pt needs Mod cues for sustained attention and initiation for self-feeding. Set-up assist also provided by SLP, including cutting meat/lettuce from salad on lunch tray. Pt had prolonged mastication with regular  textures, ultimately able to swallow some of the chicken but having to expectorate the lettuce. He says that at home he does not eat anything hard and requests that his food be softer. Will start with mechanical soft textures. Mentation also puts him at an elevated risk for aspiration, therefore will f/u for tolerance. SLP Visit Diagnosis: Dysphagia, oral phase (R13.11)    Aspiration Risk  Mild aspiration risk    Diet Recommendation Dysphagia 3 (Mech soft);Thin liquid   Liquid Administration via: Cup;Straw Medication Administration: Whole meds with liquid Supervision: Patient able to self feed;Full supervision/cueing for compensatory strategies Compensations: Minimize environmental distractions;Slow rate;Small sips/bites;Follow solids with liquid Postural Changes: Seated upright at 90 degrees    Other  Recommendations Oral Care Recommendations: Oral care BID   Follow up Recommendations 24 hour supervision/assistance      Frequency and Duration min 2x/week  1 week       Prognosis Prognosis for Safe Diet Advancement: (suspect this is pt's baseline diet)      Swallow Study   General HPI: Pt is a 62 y.o. 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 PMH includes initial L 5th toe amp 10/09/17,  DMII, PAD, PVD, MI, HTN. Type of Study: Bedside Swallow Evaluation Previous Swallow Assessment: none in chart Diet Prior to this Study: Regular;Thin liquids Temperature Spikes Noted: Yes(99.9) Respiratory Status: Nasal cannula History of Recent Intubation: No Behavior/Cognition: Alert;Cooperative;Confused Oral Cavity Assessment: Within Functional Limits Oral Care Completed by SLP: No Oral Cavity - Dentition: Missing dentition Vision: Functional for self-feeding Self-Feeding Abilities: Able to feed self;Needs set up Patient Positioning: Upright in chair Baseline Vocal Quality:  Normal Volitional Cough: Strong Volitional Swallow: Able to elicit    Oral/Motor/Sensory Function Overall Oral Motor/Sensory Function: (difficulty following commands but appears WFL)   Ice Chips Ice chips: Not tested   Thin Liquid Thin Liquid: Within functional limits Presentation: Cup;Self Fed    Nectar Thick Nectar Thick Liquid: Not tested   Honey Thick Honey Thick Liquid: Not tested   Puree Puree: Not tested   Solid   GO   Solid: Impaired Presentation: Self Fed Oral Phase Impairments: Impaired mastication Oral Phase Functional Implications: Other (comment)(orally expectorated lettuce - could not properly masticate)        Maxcine Ham 01/09/2018,2:41 PM  Maxcine Ham, M.A. CCC-SLP [phone]

## 2018-01-09 NOTE — Progress Notes (Signed)
Physical Therapy Treatment Patient Details Name: Joel Hebert Langsam MRN: [id number] DOB: 05/15/1956 Today's Date: 01/09/2018    History of Present Illness Pt is a 62 y.o. 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 PMH includes initial L 5th toe amp 10/09/17,  DMII, PAD, PVD, MI, HTN.    PT Comments    Patient is making progress toward PT goals and less confused this session. Pt continues to present with slow processing and decreased initiation of tasks and needs multimodal cues for sequencing. Pt is able to stand pivot bed to recliner with mod A +2 for safety. Continue to progress as tolerated with anticipated d/c to SNF for further skilled PT services.     Follow Up Recommendations  SNF;Supervision for mobility/OOB     Equipment Recommendations  (TBD next venue)    Recommendations for Other Services       Precautions / Restrictions Precautions Precautions: Fall;Other (comment) Precaution Comments: L BKA wound vac Restrictions Weight Bearing Restrictions: Yes [name] Weight Bearing: Non weight bearing    Mobility  Bed Mobility Overal bed mobility: Needs Assistance Bed Mobility: Supine to Sit     Supine to sit: Mod assist;+2 for physical assistance     General bed mobility comments: multimodal cues for sequencing; pt assisted more when task initiated by therapist; use of rail  Transfers Overall transfer level: Needs assistance Equipment used: Rolling walker (2 wheeled) Transfers: Sit to/from [employer] CorporationStand;Stand Pivot Transfers Sit to Stand: Mod assist;+2 physical assistance;From elevated surface Stand pivot transfers: Mod assist;+2 safety/equipment       General transfer comment: assist to power up into standing and to stabilize RW; cues for sequencing and assistance to turn RW when pivoting  Ambulation/Gait                 Stairs              Wheelchair Mobility    Modified Rankin (Stroke Patients Only)       Balance Overall balance assessment: Needs assistance Sitting-balance support: Feet supported;Single extremity supported Sitting balance-Leahy Scale: Poor     Standing balance support: Bilateral upper extremity supported Standing balance-Leahy Scale: Poor                              Cognition Arousal/Alertness: Awake/alert Behavior During Therapy: Flat affect Overall Cognitive Status: Impaired/Different from baseline Area of Impairment: Following commands;Problem solving;Safety/judgement;Awareness;Attention                   Current Attention Level: Sustained   Following Commands: Follows one step commands inconsistently;Follows one step commands with increased time Safety/Judgement: Decreased awareness of safety;Decreased awareness of deficits Awareness: Intellectual Problem Solving: Slow processing;Decreased initiation;Difficulty sequencing;Requires verbal cues;Requires tactile cues General Comments: pt is less confused today than previous session; continues to be slow to process and needs cues for sequencing of tasks      Exercises      General Comments        Pertinent Vitals/Pain Pain Assessment: Faces Faces Pain Scale: Hurts little more Pain Location: R LE Pain Descriptors / Indicators: Sore Pain Intervention(s): Limited activity within patient's tolerance;Monitored during session;Repositioned    Home Living                      Prior Function  PT Goals (current goals can now be found in the care plan section) Acute Rehab PT Goals Patient Stated Goal: decrease pain PT Goal Formulation: Patient unable to participate in goal setting Time For Goal Achievement: 01/19/18 Potential to Achieve Goals: Good Progress towards PT goals: Progressing toward goals    Frequency    Min 3X/week      PT Plan Current plan remains appropriate     Co-evaluation              AM-PAC PT "6 Clicks" Daily Activity  Outcome Measure  Difficulty turning over in bed (including adjusting bedclothes, sheets and blankets)?: Unable Difficulty moving from lying on back to sitting on the side of the bed? : Unable Difficulty sitting down on and standing up from a chair with arms (e.g., wheelchair, bedside commode, etc,.)?: Unable Help needed moving to and from a bed to chair (including a wheelchair)?: A Lot Help needed walking in hospital room?: A Lot Help needed climbing 3-5 steps with a railing? : Total 6 Click Score: 8    End of Session Equipment Utilized During Treatment: Gait belt Activity Tolerance: Patient tolerated treatment well Patient left: in chair;with call bell/phone within reach;with chair alarm set Nurse Communication: Mobility status PT Visit Diagnosis: Other abnormalities of gait and mobility (R26.89);Difficulty in walking, not elsewhere classified (R26.2) Pain - Right/Left: Left Pain - part of body: [name]     Time: 1610-9604 PT Time Calculation (min) (ACUTE ONLY): 29 min  Charges:  $Therapeutic Activity: 23-37 mins                    G Codes:       Erline Levine, PTA Pager: [phone]     Carolynne Edouard 01/09/2018, 4:01 PM

## 2018-01-09 NOTE — Progress Notes (Signed)
I spoke with on-call resident, she states to do a bedside swallow evaluation now that patient is awake, alert and oriented. Patient swallowed water with no signs of aspiration. Patient swallowed pills and ate crackers with no signs cough afterwards. Will start patient on a HH/CM diet.

## 2018-01-09 NOTE — Progress Notes (Signed)
   Subjective: I visited [name]. He response to his name. Communicate better. Does not have specific complaint. When asked him, state some pain at his Rt lower extremity (Amputation site). he  had his second HD yesterday  Objective:  Vital signs in last 24 hours: Vitals:   01/09/18 0435 01/09/18 0734 01/09/18 1147 01/09/18 1704  BP: (!) 179/84  (!) 165/70   Pulse: 87  81   Resp:      Temp: 98.9 F (37.2 C) 98.2 F (36.8 C)  97.9 F (36.6 C)  TempSrc: Oral Oral  Oral  SpO2:      Weight: 218 lb 4.1 oz (99 kg)     Height:       Ph/E:  General: Alert and oriented to place and time.  Cardiac: Nl S1S2, RRR, no murmure Lungs are clear comparing to yesterday evening Ext:  asterixia is less comparing to yesterdayd. Has dry ischemic area/gangren at rt big toe. Amputation site in left midtibia is dry with drain tube in place.  Assessment/Plan:  Active problem: 1)Acute renal failure: Patient is Improving. Had urin output today. Today BUN 46,  Cr 5.7 Nephrology is on board. Probable HD today   2)Acute Encephalopathy: In setting of uremia. He is more alert and oriented today.   -PO -Probable HD today  3)Wet gangrene and Sepsis s/p amputation, s/p Vanco, cefepime, Metronidazole. (Stopped yesterday). Wound looks good. Afebrile  4)Hyperkalemia:resolved.Today k: 3.6 -Check K daily with renal panel    Dispo: Anticipated discharge in approximately 3-4 day(s) as he regain his mental status and renal function  Chevis PrettyMasoudi, Tahji Holton, MD 01/09/2018, 7:08 PM Pager: @MYPAGER @

## 2018-01-10 LAB — RENAL FUNCTION PANEL
Albumin: 2 g/dL — ABNORMAL LOW (ref 3.5–5.0)
Anion gap: 12 (ref 5–15)
BUN: 50 mg/dL — ABNORMAL HIGH (ref 8–23)
CALCIUM: 8.1 mg/dL — AB (ref 8.9–10.3)
CO2: 31 mmol/L (ref 22–32)
CREATININE: 5.81 mg/dL — AB (ref 0.61–1.24)
Chloride: 97 mmol/L — ABNORMAL LOW (ref 98–111)
GFR, EST AFRICAN AMERICAN: 11 mL/min — AB (ref >60)
GFR, EST NON AFRICAN AMERICAN: 9 mL/min — AB (ref >60)
Glucose, Bld: 112 mg/dL — ABNORMAL HIGH (ref 70–99)
Phosphorus: 5.4 mg/dL — ABNORMAL HIGH (ref 2.5–4.6)
Potassium: 3.4 mmol/L — ABNORMAL LOW (ref 3.5–5.1)
SODIUM: 140 mmol/L (ref 135–145)

## 2018-01-10 LAB — GLUCOSE, CAPILLARY
GLUCOSE-CAPILLARY: 113 mg/dL — AB (ref 70–99)
GLUCOSE-CAPILLARY: 146 mg/dL — AB (ref 70–99)
Glucose-Capillary: 118 mg/dL — ABNORMAL HIGH (ref 70–99)
Glucose-Capillary: 134 mg/dL — ABNORMAL HIGH (ref 70–99)

## 2018-01-10 MED ORDER — HEPARIN SODIUM (PORCINE) 1000 UNIT/ML DIALYSIS
20.0000 [IU]/kg | INTRAMUSCULAR | Status: DC | PRN
  Administered 2018-01-10: 2000 [IU] via INTRAVENOUS_CENTRAL
  Filled 2018-01-10 (×2): qty 2

## 2018-01-10 MED ORDER — HEPARIN SODIUM (PORCINE) 1000 UNIT/ML DIALYSIS
1000.0000 [IU] | INTRAMUSCULAR | Status: DC | PRN
  Administered 2018-01-11: 1000 [IU] via INTRAVENOUS_CENTRAL
  Filled 2018-01-10 (×2): qty 1

## 2018-01-10 MED ORDER — SODIUM CHLORIDE 0.9 % IV SOLN
INTRAVENOUS | Status: DC
Start: 2018-01-10 — End: 2018-01-11
  Administered 2018-01-10 – 2018-01-11 (×3): via INTRAVENOUS

## 2018-01-10 NOTE — Progress Notes (Signed)
   Subjective: Joel Hebert is feeling good today. He is completely awake and comiunicate well. We talked to him generally about his condition in this course of hospitalization. Family arrived in the afternoon and wife asked to talk to the doctor to get an update. I personally met them to address their questions. I explained about Joel Hebert recent kidney injury and his change of mental status due to sepsis as well as the interventions have been done with nephrology on board, the level of improvement so far and next plan.   Objective:  Vital signs in last 24 hours: Vitals:   01/10/18 0818 01/10/18 1206 01/10/18 1602 01/10/18 1611  BP: (!) 168/73 (!) 159/65  (!) 154/81  Pulse: 83 84  78  Resp: '16 17  19  '$ Temp:  98.3 F (36.8 C) 98.5 F (36.9 C)   TempSrc:  Oral Oral   SpO2: 97% 95%  100%  Weight:      Height:        Physical Exam   General: He is oriented to person, place, and time. Comiunicate well. Cardiovascular: RRR, normal S1S2. No murmur  Pulmonary/Chest: CTA bilaterally. In no acute distress  Extremities:  Has dry ischemic area/gangren at rt big toe. Amputation site in left midtibia is dry with drain tube in place    Assessment/Plan:  Active problem: 1)Acute renal failure, in setting of sepsis and decreased renal perfusion. S/P HD x 2. Had 350 ml  Urine Output from 7AM to 7 PM today  Yesterday BUN:46  Cr:5.7. Nephrology is on board and they held the HD yesterday to evaluate patients renal function. His renal panel today showed mild increase as bellow BUN:50 and Cr:5.8.    -Plan for HD today   -Monitor renal panel daily   2)Acute Encephalopathy: Happened in setting of uremia. Improved significantly and he is alert and oriented today. No asterixia  3)Wet gangrene and Sepsis s/p amputation, s/p Vanco, cefepime, Metronidazole. Improved. Wound looks good. Has been afebrile  4)Hyperkalemia:resolved. Resolved.Today k: 3.4 -Check K daily with renal panel    Dispo:  Anticipated discharge: based on patient's dependency to HD   Dewayne Hatch, MD 01/10/2018, 4:33 PM Pager: [phone]

## 2018-01-10 NOTE — Progress Notes (Signed)
S: More awake and alert today, still confused  O:BP (!) 168/73   Pulse 83   Temp 98.3 F (36.8 C) (Oral)   Resp 16   Ht 5\' 8"  (1.727 m)   Wt 95.2 kg (209 lb 14.1 oz)   SpO2 97%   BMI 31.91 kg/m   Intake/Output Summary (Last 24 hours) at 01/10/2018 1019 Last data filed at 01/10/2018 0938 Gross per 24 hour  Intake 2571.59 ml  Output 1250 ml  Net 1321.59 ml   Intake/Output: I/O last 3 completed shifts: In: 2371.6 [P.O.:250; I.V.:2121.6] Out: 2100 [Urine:2100]  Intake/Output this shift:  Total I/O In: 200 [P.O.:200] Out: 150 [Urine:150] Weight change: -2.6 kg (-5 lb 11.7 oz) Gen:NAD CVS: no rub Resp: cta Abd: benign ZOX:WRUEA edema, s/p LBKA with wound vac in place  Recent Labs  Lab 01/03/18 1056  01/05/18 0459 01/06/18 1030 01/06/18 1624 01/07/18 0505 01/08/18 0459 01/09/18 0555 01/10/18 0436  NA 134*   < > 136 136 135 136 140 141 140  K 5.2*   < > 5.2* 5.3* 5.3* 5.1 4.3 3.6 3.4*  CL 98   < > 107 100 101 99 99 101 97*  CO2 22   < > 21* 22 22 22 22 27 31   GLUCOSE 154*   < > 82 110* 122* 111* 131* 105* 112*  BUN 56*   < > 63* 69* 72* 82* 58* 46* 50*  CREATININE 6.29*   < > 7.76* 8.64* 8.77* 8.96* 6.91* 5.71* 5.81*  ALBUMIN 2.2*  --   --  1.6*  --  2.3* 2.4* 2.1* 2.0*  CALCIUM 9.3   < > 8.4* 8.2* 8.3* 8.5* 8.6* 8.1* 8.1*  PHOS  --   --   --  6.4*  --  6.2* 4.7* 4.8* 5.4*  AST 39  --   --   --   --   --   --   --   --   ALT 25  --   --   --   --   --   --   --   --    < > = values in this interval not displayed.   Liver Function Tests: Recent Labs  Lab 01/03/18 1056  01/08/18 0459 01/09/18 0555 01/10/18 0436  AST 39  --   --   --   --   ALT 25  --   --   --   --   ALKPHOS 195*  --   --   --   --   BILITOT 0.3  --   --   --   --   PROT 6.6  --   --   --   --   ALBUMIN 2.2*   < > 2.4* 2.1* 2.0*   < > = values in this interval not displayed.   No results for input(s): LIPASE, AMYLASE in the last 168 hours. No results for input(s): AMMONIA in the last 168  hours. CBC: Recent Labs  Lab 01/03/18 1056  01/05/18 0459 01/06/18 0701 01/07/18 0505 01/08/18 0459 01/09/18 2047  WBC 36.2*   < > 25.0* 23.4* 18.3* 20.9* 15.7*  NEUTROABS 31.8*  --   --   --   --   --   --   HGB 6.8*   < > 7.7* 7.5* 7.3* 7.6* 8.1*  HCT 21.9*   < > 23.5* 22.9* 22.0* 23.9* 25.1*  MCV 86.6   < > 86.1 84.2 83.0 84.8 84.8  PLT  452*   < > 317 380 357 274 212   < > = values in this interval not displayed.   Cardiac Enzymes: Recent Labs  Lab 01/05/18 0459  CKTOTAL 910*   CBG: Recent Labs  Lab 01/09/18 0737 01/09/18 1229 01/09/18 1702 01/09/18 2057 01/10/18 0754  GLUCAP 99 107* 104* 134* 113*    Iron Studies: No results for input(s): IRON, TIBC, TRANSFERRIN, FERRITIN in the last 72 hours. Studies/Results: No results found. Marland Kitchen. amLODipine  5 mg Oral Daily  . aspirin EC  81 mg Oral BID  . Chlorhexidine Gluconate Cloth  6 each Topical Q0600  . clopidogrel  75 mg Oral Q breakfast  . docusate sodium  100 mg Oral BID  . gabapentin  300 mg Oral QHS  . insulin aspart  0-5 Units Subcutaneous QHS  . insulin aspart  0-9 Units Subcutaneous TID WC  . mouth rinse  15 mL Mouth Rinse BID  . metoprolol tartrate  100 mg Oral BID  . rosuvastatin  20 mg Oral QPM  . vitamin B-12  1,000 mcg Oral Daily    BMET    Component Value Date/Time   NA 140 01/10/2018 0436   K 3.4 (L) 01/10/2018 0436   CL 97 (L) 01/10/2018 0436   CO2 31 01/10/2018 0436   GLUCOSE 112 (H) 01/10/2018 0436   BUN 50 (H) 01/10/2018 0436   CREATININE 5.81 (H) 01/10/2018 0436   CALCIUM 8.1 (L) 01/10/2018 0436   GFRNONAA 9 (L) 01/10/2018 0436   GFRAA 11 (L) 01/10/2018 0436   CBC    Component Value Date/Time   WBC 15.7 (H) 01/09/2018 2047   RBC 2.96 (L) 01/09/2018 2047   HGB 8.1 (L) 01/09/2018 2047   HCT 25.1 (L) 01/09/2018 2047   PLT 212 01/09/2018 2047   MCV 84.8 01/09/2018 2047   MCH 27.4 01/09/2018 2047   MCHC 32.3 01/09/2018 2047   RDW 15.1 01/09/2018 2047   LYMPHSABS 1.8 01/03/2018  1056   MONOABS 2.2 (H) 01/03/2018 1056   EOSABS 0.4 01/03/2018 1056   BASOSABS 0.0 01/03/2018 1056    Assessment/Plan:  1. AKI/CKD stage 3- non-oliguric with multiple renal insults including ischemic ATN in setting of hypotension/ABLA, NSAIDs, ACE-I and diuretics. Pt with worsening BUN/Cr as well as AMS and some asterixis. 1. S/p temporary HD cath byIRon 01/07/18 and first HD session 2. Marked improvement in MS after 2 HD sessions and plan for third today. 3. Good UOPand hopefully will see improvement in BUN/Cr between HD sessions soon as rate of rise has markedly slowed. 2. ABLA- s/p BKA, transfuse if continues to drop 3. PVD s/p LBKA 01/04/18- per Dr. Lajoyce Cornersuda, wound vac in place 4. HTN- will follow after HD 5. AMS- presumably combination of anesthesia and uremia. Plan for HD and follow.Has shown some improvement since HD initiated. 6. Hyperkalemia- as above, resolved with HD 7. Elevated WBC- improving.   Irena CordsJoseph A. Josede Cicero, MD [facility] [phone]

## 2018-01-11 DIAGNOSIS — Z899 Acquired absence of limb, unspecified: Secondary | ICD-10-CM

## 2018-01-11 DIAGNOSIS — D649 Anemia, unspecified: Secondary | ICD-10-CM

## 2018-01-11 LAB — GLUCOSE, CAPILLARY
GLUCOSE-CAPILLARY: 110 mg/dL — AB (ref 70–99)
GLUCOSE-CAPILLARY: 147 mg/dL — AB (ref 70–99)
GLUCOSE-CAPILLARY: 144 mg/dL — AB (ref 70–99)
Glucose-Capillary: 119 mg/dL — ABNORMAL HIGH (ref 70–99)

## 2018-01-11 LAB — RENAL FUNCTION PANEL
ALBUMIN: 1.9 g/dL — AB (ref 3.5–5.0)
ANION GAP: 11 (ref 5–15)
Albumin: 2 g/dL — ABNORMAL LOW (ref 3.5–5.0)
Anion gap: 8 (ref 5–15)
BUN: 32 mg/dL — ABNORMAL HIGH (ref 8–23)
BUN: 54 mg/dL — AB (ref 8–23)
CALCIUM: 7.6 mg/dL — AB (ref 8.9–10.3)
CHLORIDE: 97 mmol/L — AB (ref 98–111)
CO2: 29 mmol/L (ref 22–32)
CO2: 33 mmol/L — AB (ref 22–32)
CREATININE: 4.14 mg/dL — AB (ref 0.61–1.24)
CREATININE: 5.97 mg/dL — AB (ref 0.61–1.24)
Calcium: 7.7 mg/dL — ABNORMAL LOW (ref 8.9–10.3)
Chloride: 99 mmol/L (ref 98–111)
GFR calc Af Amer: 10 mL/min — ABNORMAL LOW (ref >60)
GFR calc non Af Amer: 14 mL/min — ABNORMAL LOW (ref >60)
GFR, EST AFRICAN AMERICAN: 16 mL/min — AB (ref >60)
GFR, EST NON AFRICAN AMERICAN: 9 mL/min — AB (ref >60)
Glucose, Bld: 127 mg/dL — ABNORMAL HIGH (ref 70–99)
Glucose, Bld: 111 mg/dL — ABNORMAL HIGH (ref 70–99)
Phosphorus: 5.4 mg/dL — ABNORMAL HIGH (ref 2.5–4.6)
Phosphorus: 3.8 mg/dL (ref 2.5–4.6)
Potassium: 3.4 mmol/L — ABNORMAL LOW (ref 3.5–5.1)
Potassium: 3.6 mmol/L (ref 3.5–5.1)
SODIUM: 138 mmol/L (ref 135–145)
Sodium: 139 mmol/L (ref 135–145)

## 2018-01-11 LAB — CBC
HCT: 21.2 % — ABNORMAL LOW (ref 39.0–52.0)
HCT: 23.3 % — ABNORMAL LOW (ref 39.0–52.0)
Hemoglobin: 6.5 g/dL — CL (ref 13.0–17.0)
Hemoglobin: 7.3 g/dL — ABNORMAL LOW (ref 13.0–17.0)
MCH: 26.8 pg (ref 26.0–34.0)
MCH: 26.9 pg (ref 26.0–34.0)
MCHC: 30.7 g/dL (ref 30.0–36.0)
MCHC: 31.3 g/dL (ref 30.0–36.0)
MCV: 85.7 fL (ref 78.0–100.0)
MCV: 87.6 fL (ref 78.0–100.0)
PLATELETS: 180 10*3/uL (ref 150–400)
PLATELETS: 242 10*3/uL (ref 150–400)
RBC: 2.42 MIL/uL — ABNORMAL LOW (ref 4.22–5.81)
RBC: 2.72 MIL/uL — AB (ref 4.22–5.81)
RDW: 15.2 % (ref 11.5–15.5)
RDW: 15.4 % (ref 11.5–15.5)
WBC: 12 10*3/uL — AB (ref 4.0–10.5)
WBC: 13.3 10*3/uL — AB (ref 4.0–10.5)

## 2018-01-11 LAB — RETICULOCYTES
RBC.: 2.7 MIL/uL — ABNORMAL LOW (ref 4.22–5.81)
RETIC COUNT ABSOLUTE: 54 10*3/uL (ref 19.0–186.0)
Retic Ct Pct: 2 % (ref 0.4–3.1)

## 2018-01-11 LAB — HEPATIC FUNCTION PANEL
ALBUMIN: 1.9 g/dL — AB (ref 3.5–5.0)
ALT: 10 U/L (ref 0–44)
AST: 31 U/L (ref 15–41)
Alkaline Phosphatase: 178 U/L — ABNORMAL HIGH (ref 38–126)
Bilirubin, Direct: <0.1 mg/dL (ref 0.0–0.2)
Total Bilirubin: 0.5 mg/dL (ref 0.3–1.2)
Total Protein: 4.9 g/dL — ABNORMAL LOW (ref 6.5–8.1)

## 2018-01-11 LAB — IRON AND TIBC
IRON: 24 ug/dL — AB (ref 45–182)
Saturation Ratios: 15 % — ABNORMAL LOW (ref 17.9–39.5)
TIBC: 157 ug/dL — AB (ref 250–450)
UIBC: 133 ug/dL

## 2018-01-11 LAB — SAVE SMEAR

## 2018-01-11 LAB — LACTATE DEHYDROGENASE: LDH: 186 U/L (ref 98–192)

## 2018-01-11 LAB — FERRITIN: FERRITIN: 468 ng/mL — AB (ref 24–336)

## 2018-01-11 LAB — VITAMIN B12: Vitamin B-12: 1304 pg/mL — ABNORMAL HIGH (ref 180–914)

## 2018-01-11 LAB — PREPARE RBC (CROSSMATCH)

## 2018-01-11 MED ORDER — SODIUM CHLORIDE 0.9% IV SOLUTION
Freq: Once | INTRAVENOUS | Status: AC
  Administered 2018-01-11: 04:00:00 via INTRAVENOUS

## 2018-01-11 NOTE — Progress Notes (Signed)
HD tx initiated via HD cath w/o problem, bilat ports: pull/push/flush equally w/o problem, VSS, will cont to monitor while on HD tx 

## 2018-01-11 NOTE — Progress Notes (Signed)
S: Feels much better and is awake alert and oritented x 3 O:BP (!) 150/65   Pulse 76   Temp 98.3 F (36.8 C)   Resp 16   Ht 5\' 8"  (1.727 m)   Wt 98 kg (216 lb 0.8 oz)   SpO2 97%   BMI 32.85 kg/m   Intake/Output Summary (Last 24 hours) at 01/11/2018 1347 Last data filed at 01/11/2018 0650 Gross per 24 hour  Intake 1429.02 ml  Output 1162 ml  Net 267.02 ml   Intake/Output: I/O last 3 completed shifts: In: 1629 [P.O.:600; I.V.:714; Blood:315] Out: 1812 [Urine:775; Other:1037]  Intake/Output this shift:  No intake/output data recorded. Weight change: 3.8 kg (8 lb 6 oz) Gen: NAD CVS: no rub Resp: cta Abd: benign Ext: trace pretibial edema on right s/p LBKA with wound vac in place  Recent Labs  Lab 01/06/18 1030 01/06/18 1624 01/07/18 0505 01/08/18 0459 01/09/18 0555 01/10/18 0436 01/11/18 0048 01/11/18 0749  NA 136 135 136 140 141 140 138 139  K 5.3* 5.3* 5.1 4.3 3.6 3.4* 3.4* 3.6  CL 100 101 99 99 101 97* 97* 99  CO2 22 22 22 22 27 31  33* 29  GLUCOSE 110* 122* 111* 131* 105* 112* 127* 111*  BUN 69* 72* 82* 58* 46* 50* 54* 32*  CREATININE 8.64* 8.77* 8.96* 6.91* 5.71* 5.81* 5.97* 4.14*  ALBUMIN 1.6*  --  2.3* 2.4* 2.1* 2.0* 2.0* 1.9*  1.9*  CALCIUM 8.2* 8.3* 8.5* 8.6* 8.1* 8.1* 7.7* 7.6*  PHOS 6.4*  --  6.2* 4.7* 4.8* 5.4* 5.4* 3.8  AST  --   --   --   --   --   --   --  31  ALT  --   --   --   --   --   --   --  10   Liver Function Tests: Recent Labs  Lab 01/10/18 0436 01/11/18 0048 01/11/18 0749  AST  --   --  31  ALT  --   --  10  ALKPHOS  --   --  178*  BILITOT  --   --  0.5  PROT  --   --  4.9*  ALBUMIN 2.0* 2.0* 1.9*  1.9*   No results for input(s): LIPASE, AMYLASE in the last 168 hours. No results for input(s): AMMONIA in the last 168 hours. CBC: Recent Labs  Lab 01/07/18 0505 01/08/18 0459 01/09/18 2047 01/11/18 0048 01/11/18 0802  WBC 18.3* 20.9* 15.7* 13.3* 12.0*  HGB 7.3* 7.6* 8.1* 6.5* 7.3*  HCT 22.0* 23.9* 25.1* 21.2* 23.3*  MCV  83.0 84.8 84.8 87.6 85.7  PLT 357 274 212 242 180   Cardiac Enzymes: Recent Labs  Lab 01/05/18 0459  CKTOTAL 910*   CBG: Recent Labs  Lab 01/10/18 1227 01/10/18 1624 01/10/18 2146 01/11/18 0745 01/11/18 1243  GLUCAP 146* 118* 134* 110* 147*    Iron Studies:  Recent Labs    01/11/18 0749  IRON 24*  TIBC 157*  FERRITIN 468*   Studies/Results: No results found. Marland Kitchen amLODipine  5 mg Oral Daily  . aspirin EC  81 mg Oral BID  . Chlorhexidine Gluconate Cloth  6 each Topical Q0600  . clopidogrel  75 mg Oral Q breakfast  . docusate sodium  100 mg Oral BID  . gabapentin  300 mg Oral QHS  . insulin aspart  0-5 Units Subcutaneous QHS  . insulin aspart  0-9 Units Subcutaneous TID WC  . mouth rinse  15 mL Mouth Rinse BID  . metoprolol tartrate  100 mg Oral BID  . rosuvastatin  20 mg Oral QPM  . vitamin B-12  1,000 mcg Oral Daily    BMET    Component Value Date/Time   NA 139 01/11/2018 0749   K 3.6 01/11/2018 0749   CL 99 01/11/2018 0749   CO2 29 01/11/2018 0749   GLUCOSE 111 (H) 01/11/2018 0749   BUN 32 (H) 01/11/2018 0749   CREATININE 4.14 (H) 01/11/2018 0749   CALCIUM 7.6 (L) 01/11/2018 0749   GFRNONAA 14 (L) 01/11/2018 0749   GFRAA 16 (L) 01/11/2018 0749   CBC    Component Value Date/Time   WBC 12.0 (H) 01/11/2018 0802   RBC 2.72 (L) 01/11/2018 0802   HGB 7.3 (L) 01/11/2018 0802   HCT 23.3 (L) 01/11/2018 0802   PLT 180 01/11/2018 0802   MCV 85.7 01/11/2018 0802   MCH 26.8 01/11/2018 0802   MCHC 31.3 01/11/2018 0802   RDW 15.4 01/11/2018 0802   LYMPHSABS 1.8 01/03/2018 1056   MONOABS 2.2 (H) 01/03/2018 1056   EOSABS 0.4 01/03/2018 1056   BASOSABS 0.0 01/03/2018 1056    Assessment/Plan:  1. AKI/CKD stage 3- non-oliguric with multiple renal insults including ischemic ATN in setting of hypotension/ABLA, NSAIDs, ACE-I and diuretics. Pt with worsening BUN/Cr as well as AMS and some asterixis. 1. S/p temporary HD cath byIRon 01/07/18 and first HD  session 2. Marked improvement in MS after3HD sessions 3. Good UOPand hopefully will see improvement in BUN/Cr between HD sessions soon as rate of rise has markedly slowed. 4. Will hold off on HD for tomorrow given increased UOP and slower rate of rise in BUN/Cr. 2. ABLA- s/p BKA, transfuse if continues to drop 3. PVD s/p LBKA 01/04/18- per Dr. Lajoyce Cornersuda, wound vac in place 4. HTN- will follow after HD 5. AMS- presumably combination of anesthesia and uremia. Plan for HD and follow.Has shown some improvement since HD initiated. 6. Hyperkalemia- as above, resolved with HD 7. Elevated WBC- improving.   Irena CordsJoseph A. Alveena Taira, MD [facility] [phone]

## 2018-01-11 NOTE — Plan of Care (Signed)
  Problem: Education: Goal: Knowledge of General Education information will improve Outcome: Progressing Note:  POC reviewed with pt., and informed of going to dialysis tonight.

## 2018-01-11 NOTE — Progress Notes (Signed)
   Subjective: [name] is doing well today. Does not have any SOB, dizziness, chest pain.  Denies any abdominal pain, nause, vomiting. Has not had any abnormal bowel movement   Objective:  Vital signs in last 24 hours: Vitals:   01/11/18 0700 01/11/18 0745 01/11/18 0825 01/11/18 1625  BP: (!) 154/68  (!) 150/65 (!) 172/78  Pulse: 79  76 73  Resp: 16   12  Temp: 98.3 F (36.8 C) 98.3 F (36.8 C)  98.5 F (36.9 C)  TempSrc:    Oral  SpO2: 97%   94%  Weight:      Height:       Ph/e:  General: Alert and oriented, in no acute distress Cardiac: RRR, no murmure Lungs are CTA bilaterally, normal breath sounds Abdomen: No distension. BS is normal, abdomen is soft, non tender Extremities: Amputation site dress is clear and intact, No edema at Right lower extremity. Pulses are normal at distal extremities and Rt lower extremity  Assessment/Plan:  Active problem: 1) ARF, in setting of sepsis and decreased renal perfusion. S/P HD x 3. Today BUN: 32, Cr:4.1 Had 475 ml  Urine Output from yesterday  2) Acute anemia: Asymptomatic, Denies abdomina pain, nause or vomiting, has not had any GI movement Yesterday Hb dropped to 6.5 (from 8.1) s/p 1 unit P.cell. -Monitor Hb and blood loss sign and symptoms -Check occult blood    2)Acute Encephalopathy: Happened in setting of uremia. Resolcved. Has been alert and oriented.  3)Wet gangrene and Sepsiss/p amputation, s/p Vanco, cefepime, Metronidazole. Improved. Wound looks good. Has been afebrile  4)Hyperkalemia:resolved. Resolved.Today k:3.4 -Check K daily with renal panel     Dispo: Anticipated discharge in 4-5 days depend of kidney function   Joel Hebert, Joel Salois, MD 01/11/2018, 7:29 PM Pager: @[phone] @

## 2018-01-11 NOTE — Progress Notes (Signed)
RN called Tori,RN in dialysis to inform her that the unit of blood is ready for pt., and was told that pt. only has 10 more minutes to run on dialysis machine.

## 2018-01-11 NOTE — Progress Notes (Signed)
  Speech Language Pathology Treatment: Dysphagia  Patient Details Name: Tresean Mattix MRN: [id number] DOB: 1955-08-19 Today's Date: 01/11/2018 Time: 0354-6568 SLP Time Calculation (min) (ACUTE ONLY): 8 min  Assessment / Plan / Recommendation Clinical Impression  Pt states he has not had difficulty masticating during meals and is ordering softer foods. Oral phase within functional limits with solid texture and delayed cough noted which he reports has not frequently noted. Pt states he prefers to stay on Dys 3 texture versus upgrading to regular. Reiterated precautions. DIscharge ST.   HPI HPI: Pt is a 62 y.o. 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 PMH includes initial L 5th toe amp 10/09/17,  DMII, PAD, PVD, MI, HTN.      SLP Plan  All goals met;Discharge SLP treatment due to (comment)       Recommendations  Diet recommendations: Dysphagia 3 (mechanical soft);Thin liquid Liquids provided via: Cup;Straw Medication Administration: Whole meds with liquid Supervision: Patient able to self feed Compensations: Slow rate;Small sips/bites Postural Changes and/or Swallow Maneuvers: Seated upright 90 degrees                Oral Care Recommendations: Oral care BID Follow up Recommendations: None SLP Visit Diagnosis: Dysphagia, oral phase (R13.11) Plan: All goals met;Discharge SLP treatment due to (comment)       GO                Houston Siren 01/11/2018, 3:09 PM  Orbie Pyo Colvin Caroli.Ed [employer] [phone]

## 2018-01-11 NOTE — Progress Notes (Signed)
HD tx completed @ 0334 w/o problem, UF goal met, blood rinsed back, VSS, report called to Berniece Salines, RN

## 2018-01-11 NOTE — Progress Notes (Signed)
Physical Therapy Treatment Patient Details Name: Joel Hebert MRN: [id number] DOB: 08/19/1955 Today's Date: 01/11/2018    History of Present Illness Pt is a 62 y.o. 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 PMH includes initial L 5th toe amp 10/09/17,  DMII, PAD, PVD, MI, HTN.    PT Comments    Patient continues to make progress toward PT goals and presents with improved mentation versus previous sessions. Pt educated on positioning/precautions for L LE. Continue to progress as tolerated with anticipated d/c to SNF for further skilled PT services.     Follow Up Recommendations  SNF;Supervision for mobility/OOB     Equipment Recommendations  (TBD next venue)    Recommendations for Other Services       Precautions / Restrictions Precautions Precautions: Fall;Other (comment) Precaution Comments: L BKA wound vac Restrictions Weight Bearing Restrictions: Yes [name] Weight Bearing: Non weight bearing    Mobility  Bed Mobility Overal bed mobility: Needs Assistance Bed Mobility: Supine to Sit     Supine to sit: HOB elevated;Min assist     General bed mobility comments: assist to bring L LE to EOB to maintain NWB; pt attempting to use L LE to push toward side of bed  Transfers Overall transfer level: Needs assistance Equipment used: Rolling walker (2 wheeled) Transfers: Sit to/from [employer] CorporationStand;Stand Pivot Transfers Sit to Stand: Mod assist;+2 physical assistance;From elevated surface Stand pivot transfers: Min assist;+2 safety/equipment       General transfer comment: cues for safe hand placement; assist for managing RW while pivoting and cues for sequencing  Ambulation/Gait             General Gait Details: pt able to take side steps to get closer to recliner with min guard and RW   Stairs             Wheelchair Mobility    Modified Rankin  (Stroke Patients Only)       Balance Overall balance assessment: Needs assistance Sitting-balance support: Feet supported Sitting balance-Leahy Scale: Fair     Standing balance support: Bilateral upper extremity supported Standing balance-Leahy Scale: Poor                              Cognition Arousal/Alertness: Awake/alert Behavior During Therapy: Flat affect Overall Cognitive Status: Impaired/Different from baseline Area of Impairment: Problem solving;Memory                     Memory: Decreased short-term memory Following Commands: Follows one step commands with increased time;Follows one step commands consistently     Problem Solving: Slow processing;Decreased initiation;Difficulty sequencing;Requires verbal cues;Requires tactile cues        Exercises Amputee Exercises Knee Extension: AROM;Left;10 reps;Seated    General Comments General comments (skin integrity, edema, etc.): vitals WNL and pt on RA      Pertinent Vitals/Pain Pain Assessment: Faces Faces Pain Scale: Hurts a little bit Pain Location: L LE around incision site Pain Descriptors / Indicators: Sore Pain Intervention(s): Repositioned;Monitored during session    Home Living                      Prior Function            PT Goals (current goals can now be found in the care plan section) Acute Rehab  PT Goals Patient Stated Goal: decrease pain PT Goal Formulation: Patient unable to participate in goal setting Time For Goal Achievement: 01/19/18 Potential to Achieve Goals: Good Progress towards PT goals: Progressing toward goals    Frequency    Min 3X/week      PT Plan Current plan remains appropriate    Co-evaluation              AM-PAC PT "6 Clicks" Daily Activity  Outcome Measure  Difficulty turning over in bed (including adjusting bedclothes, sheets and blankets)?: A Lot Difficulty moving from lying on back to sitting on the side of the bed? :  A Lot Difficulty sitting down on and standing up from a chair with arms (e.g., wheelchair, bedside commode, etc,.)?: Unable Help needed moving to and from a bed to chair (including a wheelchair)?: A Little Help needed walking in hospital room?: A Little Help needed climbing 3-5 steps with a railing? : Total 6 Click Score: 12    End of Session Equipment Utilized During Treatment: Gait belt Activity Tolerance: Patient tolerated treatment well Patient left: in chair;with call bell/phone within reach;with chair alarm set Nurse Communication: Mobility status PT Visit Diagnosis: Other abnormalities of gait and mobility (R26.89);Difficulty in walking, not elsewhere classified (R26.2) Pain - Right/Left: Left Pain - part of body: [name]     Time: 1130-1207 PT Time Calculation (min) (ACUTE ONLY): 37 min  Charges:  $Therapeutic Activity: 23-37 mins                    G Codes:       Erline Levine, PTA Pager: [phone]     Carolynne Edouard 01/11/2018, 1:15 PM

## 2018-01-12 LAB — RENAL FUNCTION PANEL
ALBUMIN: 2.1 g/dL — AB (ref 3.5–5.0)
ANION GAP: 12 (ref 5–15)
BUN: 36 mg/dL — ABNORMAL HIGH (ref 8–23)
CO2: 29 mmol/L (ref 22–32)
Calcium: 8.3 mg/dL — ABNORMAL LOW (ref 8.9–10.3)
Chloride: 98 mmol/L (ref 98–111)
Creatinine, Ser: 4.99 mg/dL — ABNORMAL HIGH (ref 0.61–1.24)
GFR calc Af Amer: 13 mL/min — ABNORMAL LOW (ref >60)
GFR calc non Af Amer: 11 mL/min — ABNORMAL LOW (ref >60)
Glucose, Bld: 108 mg/dL — ABNORMAL HIGH (ref 70–99)
PHOSPHORUS: 4.5 mg/dL (ref 2.5–4.6)
POTASSIUM: 3.7 mmol/L (ref 3.5–5.1)
Sodium: 139 mmol/L (ref 135–145)

## 2018-01-12 LAB — CULTURE, BLOOD (ROUTINE X 2)
CULTURE: NO GROWTH
Culture: NO GROWTH
SPECIAL REQUESTS: ADEQUATE
SPECIAL REQUESTS: ADEQUATE

## 2018-01-12 LAB — GLUCOSE, CAPILLARY
GLUCOSE-CAPILLARY: 142 mg/dL — AB (ref 70–99)
Glucose-Capillary: 106 mg/dL — ABNORMAL HIGH (ref 70–99)
Glucose-Capillary: 131 mg/dL — ABNORMAL HIGH (ref 70–99)
Glucose-Capillary: 149 mg/dL — ABNORMAL HIGH (ref 70–99)

## 2018-01-12 LAB — CBC
HEMATOCRIT: 23.6 % — AB (ref 39.0–52.0)
Hemoglobin: 7.4 g/dL — ABNORMAL LOW (ref 13.0–17.0)
MCH: 27 pg (ref 26.0–34.0)
MCHC: 31.4 g/dL (ref 30.0–36.0)
MCV: 86.1 fL (ref 78.0–100.0)
Platelets: 230 10*3/uL (ref 150–400)
RBC: 2.74 MIL/uL — ABNORMAL LOW (ref 4.22–5.81)
RDW: 15.8 % — AB (ref 11.5–15.5)
WBC: 12.2 10*3/uL — AB (ref 4.0–10.5)

## 2018-01-12 LAB — HAPTOGLOBIN: Haptoglobin: 319 mg/dL — ABNORMAL HIGH (ref 34–200)

## 2018-01-12 MED ORDER — AMLODIPINE BESYLATE 10 MG PO TABS
10.0000 mg | ORAL_TABLET | Freq: Every day | ORAL | Status: DC
  Administered 2018-01-12 – 2018-01-14 (×3): 10 mg via ORAL
  Filled 2018-01-12 (×3): qty 1

## 2018-01-12 NOTE — Progress Notes (Signed)
S: No complaints O:BP (!) 152/63 (BP Location: Left Arm)   Pulse 85   Temp 99.5 F (37.5 C) (Oral)   Resp 18   Ht 5\' 8"  (1.727 m)   Wt 98.2 kg (216 lb 7.9 oz)   SpO2 98%   BMI 32.92 kg/m   Intake/Output Summary (Last 24 hours) at 01/12/2018 1439 Last data filed at 01/12/2018 0936 Gross per 24 hour  Intake -  Output 900 ml  Net -900 ml   Intake/Output: I/O last 3 completed shifts: In: 515 [P.O.:200; Blood:315] Out: 1337 [Urine:300; Other:1037]  Intake/Output this shift:  Total I/O In: -  Out: 600 [Urine:600] Weight change: -0.8 kg (-1 lb 12.2 oz) Gen: WD NAD CVS: no rub Resp: cta Abd: benign Ext: 1+ edema on right, s/p LBKA with wound vac in place.  Recent Labs  Lab 01/07/18 0505 01/08/18 0459 01/09/18 0555 01/10/18 0436 01/11/18 0048 01/11/18 0749 01/12/18 0710  NA 136 140 141 140 138 139 139  K 5.1 4.3 3.6 3.4* 3.4* 3.6 3.7  CL 99 99 101 97* 97* 99 98  CO2 22 22 27 31  33* 29 29  GLUCOSE 111* 131* 105* 112* 127* 111* 108*  BUN 82* 58* 46* 50* 54* 32* 36*  CREATININE 8.96* 6.91* 5.71* 5.81* 5.97* 4.14* 4.99*  ALBUMIN 2.3* 2.4* 2.1* 2.0* 2.0* 1.9*  1.9* 2.1*  CALCIUM 8.5* 8.6* 8.1* 8.1* 7.7* 7.6* 8.3*  PHOS 6.2* 4.7* 4.8* 5.4* 5.4* 3.8 4.5  AST  --   --   --   --   --  31  --   ALT  --   --   --   --   --  10  --    Liver Function Tests: Recent Labs  Lab 01/11/18 0048 01/11/18 0749 01/12/18 0710  AST  --  31  --   ALT  --  10  --   ALKPHOS  --  178*  --   BILITOT  --  0.5  --   PROT  --  4.9*  --   ALBUMIN 2.0* 1.9*  1.9* 2.1*   No results for input(s): LIPASE, AMYLASE in the last 168 hours. No results for input(s): AMMONIA in the last 168 hours. CBC: Recent Labs  Lab 01/08/18 0459 01/09/18 2047 01/11/18 0048 01/11/18 0802 01/12/18 0710  WBC 20.9* 15.7* 13.3* 12.0* 12.2*  HGB 7.6* 8.1* 6.5* 7.3* 7.4*  HCT 23.9* 25.1* 21.2* 23.3* 23.6*  MCV 84.8 84.8 87.6 85.7 86.1  PLT 274 212 242 180 230   Cardiac Enzymes: No results for input(s):  CKTOTAL, CKMB, CKMBINDEX, TROPONINI in the last 168 hours. CBG: Recent Labs  Lab 01/11/18 1243 01/11/18 1600 01/11/18 2257 01/12/18 0819 01/12/18 1159  GLUCAP 147* 144* 119* 106* 131*    Iron Studies:  Recent Labs    01/11/18 0749  IRON 24*  TIBC 157*  FERRITIN 468*   Studies/Results: No results found. Marland Kitchen amLODipine  10 mg Oral Daily  . aspirin EC  81 mg Oral BID  . Chlorhexidine Gluconate Cloth  6 each Topical Q0600  . clopidogrel  75 mg Oral Q breakfast  . docusate sodium  100 mg Oral BID  . gabapentin  300 mg Oral QHS  . insulin aspart  0-5 Units Subcutaneous QHS  . insulin aspart  0-9 Units Subcutaneous TID WC  . mouth rinse  15 mL Mouth Rinse BID  . metoprolol tartrate  100 mg Oral BID  . rosuvastatin  20 mg Oral QPM  .  vitamin B-12  1,000 mcg Oral Daily    BMET    Component Value Date/Time   NA 139 01/12/2018 0710   K 3.7 01/12/2018 0710   CL 98 01/12/2018 0710   CO2 29 01/12/2018 0710   GLUCOSE 108 (H) 01/12/2018 0710   BUN 36 (H) 01/12/2018 0710   CREATININE 4.99 (H) 01/12/2018 0710   CALCIUM 8.3 (L) 01/12/2018 0710   GFRNONAA 11 (L) 01/12/2018 0710   GFRAA 13 (L) 01/12/2018 0710   CBC    Component Value Date/Time   WBC 12.2 (H) 01/12/2018 0710   RBC 2.74 (L) 01/12/2018 0710   HGB 7.4 (L) 01/12/2018 0710   HCT 23.6 (L) 01/12/2018 0710   PLT 230 01/12/2018 0710   MCV 86.1 01/12/2018 0710   MCH 27.0 01/12/2018 0710   MCHC 31.4 01/12/2018 0710   RDW 15.8 (H) 01/12/2018 0710   LYMPHSABS 1.8 01/03/2018 1056   MONOABS 2.2 (H) 01/03/2018 1056   EOSABS 0.4 01/03/2018 1056   BASOSABS 0.0 01/03/2018 1056    Assessment/Plan:  1. AKI/CKD stage 3- non-oliguric with multiple renal insults including ischemic ATN in setting of hypotension/ABLA, NSAIDs, ACE-I and diuretics. Pt with worsening BUN/Cr as well as AMS and some asterixis. 1. S/p temporary HD cath byIRon 01/07/18 and first HD session 2. Marked improvement in MS after3HD sessions 3. Good  UOPand hopefully will see improvement in BUN/Cr between HD sessions soonas rate of rise has markedly slowed. 4. Will hold off on HD for now given increased UOP and slower rate of rise in BUN/Cr. 2. ABLA- s/p BKA, transfuse if continues to drop 3. PVD s/p LBKA 01/04/18- per Dr. Lajoyce Cornersuda, wound vac in place 4. HTN- will follow after HD 5. AMS- presumably combination of anesthesia and uremia. Plan for HD and follow.Has shown some improvement since HD initiated. 6. Hyperkalemia- as above,resolved withHD 7. Elevated WBC-improving. 8. Moderate protein malnutrition- consider nepro supplements    Irena CordsJoseph A. Ashanta Amoroso, MD [facility] [phone]

## 2018-01-12 NOTE — Progress Notes (Signed)
   Subjective: Patient doing great. No complaints or symptom. I explained our plan of care for him. Asks us if we can take off his foley catheter   Objective:  Vital signs in last 24 hours: Vitals:   01/11/18 2224 01/12/18 0224 01/12/18 0442 01/12/18 0748  BP: (!) 177/81 (!) 174/77  (!) 150/62  Pulse: 89 81  85  Resp: (!) 0 16  15  Temp:   99.1 F (37.3 C)   TempSrc:      SpO2: 91% 91%  100%  Weight:   216 lb 7.9 oz (98.2 kg)   Height:       Ph/e:  General: Alert and oriented. In no acute distress. Sitting in bed comfortably with no nasal O2  Cardiac: Nl S1S2, no murmur Lungs are CTA bilaterally, normal breath sounds Abdomen: No distension. BS is normal, abdomen is soft, non tender Extremities: Left leg amputation site dressing is intact and clean with drain tube attached   Assessment/Plan:   1)Wet gangrene and Sepsiss/p amputation, s/p Vanco, cefepime, Metronidazole.Improved. Wound looks good.Has been afebrile  2) AKI: Secondary to sepsis and decreased perfusion. Mental status recovered and has been stabel. The patient is alert and oriented. s/p HDx3. Had 600 ml urine today till morning. Without HD yesterday, his BUN Cr slightly elevated (BUN 36, Cr 4.99) but fortunately urine output is gradually increasing. We will observe the patient and follow up with BMP to evaluate his need for further HD with nephrology recommendation  -BMP daily -I/O  3)HTN: today BP:150/62 -Monitor BP -Continue Amlodipine 10 mg QD and Metoprolol 100 mg BID  4)Anemia: Stable Current Hb:7.4 -CBC daily -F/u with FOBT   Dispo: Anticipated discharge in approximately 3-4 day(s).   Chevis PrettyMasoudi, Jeriann Sayres, MD 01/12/2018, 9:56 AM Pager: @[email or web address] @

## 2018-01-13 LAB — CBC
HCT: 22 % — ABNORMAL LOW (ref 39.0–52.0)
Hemoglobin: 6.9 g/dL — CL (ref 13.0–17.0)
MCH: 27 pg (ref 26.0–34.0)
MCHC: 31.4 g/dL (ref 30.0–36.0)
MCV: 85.9 fL (ref 78.0–100.0)
PLATELETS: 239 10*3/uL (ref 150–400)
RBC: 2.56 MIL/uL — ABNORMAL LOW (ref 4.22–5.81)
RDW: 15.9 % — AB (ref 11.5–15.5)
WBC: 11.2 10*3/uL — AB (ref 4.0–10.5)

## 2018-01-13 LAB — RENAL FUNCTION PANEL
ALBUMIN: 2.1 g/dL — AB (ref 3.5–5.0)
Anion gap: 7 (ref 5–15)
BUN: 42 mg/dL — ABNORMAL HIGH (ref 8–23)
CALCIUM: 8.1 mg/dL — AB (ref 8.9–10.3)
CO2: 30 mmol/L (ref 22–32)
CREATININE: 5.58 mg/dL — AB (ref 0.61–1.24)
Chloride: 101 mmol/L (ref 98–111)
GFR calc Af Amer: 11 mL/min — ABNORMAL LOW (ref >60)
GFR, EST NON AFRICAN AMERICAN: 10 mL/min — AB (ref >60)
GLUCOSE: 112 mg/dL — AB (ref 70–99)
PHOSPHORUS: 5.3 mg/dL — AB (ref 2.5–4.6)
Potassium: 4 mmol/L (ref 3.5–5.1)
SODIUM: 138 mmol/L (ref 135–145)

## 2018-01-13 LAB — GLUCOSE, CAPILLARY
GLUCOSE-CAPILLARY: 113 mg/dL — AB (ref 70–99)
GLUCOSE-CAPILLARY: 201 mg/dL — AB (ref 70–99)
Glucose-Capillary: 110 mg/dL — ABNORMAL HIGH (ref 70–99)
Glucose-Capillary: 176 mg/dL — ABNORMAL HIGH (ref 70–99)

## 2018-01-13 LAB — PREPARE RBC (CROSSMATCH)

## 2018-01-13 LAB — HEMOGLOBIN AND HEMATOCRIT, BLOOD
HCT: 28.7 % — ABNORMAL LOW (ref 39.0–52.0)
Hemoglobin: 9.1 g/dL — ABNORMAL LOW (ref 13.0–17.0)

## 2018-01-13 MED ORDER — FERROUS SULFATE 325 (65 FE) MG PO TABS
325.0000 mg | ORAL_TABLET | Freq: Two times a day (BID) | ORAL | Status: DC
  Administered 2018-01-14: 325 mg via ORAL
  Filled 2018-01-13: qty 1

## 2018-01-13 MED ORDER — SODIUM CHLORIDE 0.9% IV SOLUTION
Freq: Once | INTRAVENOUS | Status: AC
  Administered 2018-01-13: 07:00:00 via INTRAVENOUS

## 2018-01-13 NOTE — Progress Notes (Signed)
S: No new complaints O:BP (!) 166/73   Pulse 85   Temp 98.6 F (37 C) (Oral)   Resp 17   Ht 5\' 8"  (1.727 m)   Wt 97 kg (213 lb 13.5 oz)   SpO2 95%   BMI 32.52 kg/m   Intake/Output Summary (Last 24 hours) at 01/13/2018 1250 Last data filed at 01/13/2018 1000 Gross per 24 hour  Intake 350 ml  Output 800 ml  Net -450 ml   Intake/Output: I/O last 3 completed shifts: In: -  Out: 1400 [Urine:1400]  Intake/Output this shift:  Total I/O In: 350 [Blood:350] Out: -  Weight change: -1.2 kg (-2 lb 10.3 oz) Gen: NAD CVS: no rub Resp: cta Abd: benign Ext: 1+ edema right leg, s/p LBKA with wound vac in place  Recent Labs  Lab 01/08/18 0459 01/09/18 0555 01/10/18 0436 01/11/18 0048 01/11/18 0749 01/12/18 0710 01/13/18 0501  NA 140 141 140 138 139 139 138  K 4.3 3.6 3.4* 3.4* 3.6 3.7 4.0  CL 99 101 97* 97* 99 98 101  CO2 22 27 31  33* 29 29 30   GLUCOSE 131* 105* 112* 127* 111* 108* 112*  BUN 58* 46* 50* 54* 32* 36* 42*  CREATININE 6.91* 5.71* 5.81* 5.97* 4.14* 4.99* 5.58*  ALBUMIN 2.4* 2.1* 2.0* 2.0* 1.9*  1.9* 2.1* 2.1*  CALCIUM 8.6* 8.1* 8.1* 7.7* 7.6* 8.3* 8.1*  PHOS 4.7* 4.8* 5.4* 5.4* 3.8 4.5 5.3*  AST  --   --   --   --  31  --   --   ALT  --   --   --   --  10  --   --    Liver Function Tests: Recent Labs  Lab 01/11/18 0749 01/12/18 0710 01/13/18 0501  AST 31  --   --   ALT 10  --   --   ALKPHOS 178*  --   --   BILITOT 0.5  --   --   PROT 4.9*  --   --   ALBUMIN 1.9*  1.9* 2.1* 2.1*   No results for input(s): LIPASE, AMYLASE in the last 168 hours. No results for input(s): AMMONIA in the last 168 hours. CBC: Recent Labs  Lab 01/09/18 2047 01/11/18 0048 01/11/18 0802 01/12/18 0710 01/13/18 0501  WBC 15.7* 13.3* 12.0* 12.2* 11.2*  HGB 8.1* 6.5* 7.3* 7.4* 6.9*  HCT 25.1* 21.2* 23.3* 23.6* 22.0*  MCV 84.8 87.6 85.7 86.1 85.9  PLT 212 242 180 230 239   Cardiac Enzymes: No results for input(s): CKTOTAL, CKMB, CKMBINDEX, TROPONINI in the last 168  hours. CBG: Recent Labs  Lab 01/12/18 1159 01/12/18 1640 01/12/18 2237 01/13/18 0752 01/13/18 1203  GLUCAP 131* 142* 149* 113* 201*    Iron Studies:  Recent Labs    01/11/18 0749  IRON 24*  TIBC 157*  FERRITIN 468*   Studies/Results: No results found. Marland Kitchen. amLODipine  10 mg Oral Daily  . aspirin EC  81 mg Oral BID  . Chlorhexidine Gluconate Cloth  6 each Topical Q0600  . clopidogrel  75 mg Oral Q breakfast  . docusate sodium  100 mg Oral BID  . gabapentin  300 mg Oral QHS  . insulin aspart  0-5 Units Subcutaneous QHS  . insulin aspart  0-9 Units Subcutaneous TID WC  . mouth rinse  15 mL Mouth Rinse BID  . metoprolol tartrate  100 mg Oral BID  . rosuvastatin  20 mg Oral QPM  . vitamin B-12  1,000 mcg Oral Daily    BMET    Component Value Date/Time   NA 138 01/13/2018 0501   K 4.0 01/13/2018 0501   CL 101 01/13/2018 0501   CO2 30 01/13/2018 0501   GLUCOSE 112 (H) 01/13/2018 0501   BUN 42 (H) 01/13/2018 0501   CREATININE 5.58 (H) 01/13/2018 0501   CALCIUM 8.1 (L) 01/13/2018 0501   GFRNONAA 10 (L) 01/13/2018 0501   GFRAA 11 (L) 01/13/2018 0501   CBC    Component Value Date/Time   WBC 11.2 (H) 01/13/2018 0501   RBC 2.56 (L) 01/13/2018 0501   HGB 6.9 (LL) 01/13/2018 0501   HCT 22.0 (L) 01/13/2018 0501   PLT 239 01/13/2018 0501   MCV 85.9 01/13/2018 0501   MCH 27.0 01/13/2018 0501   MCHC 31.4 01/13/2018 0501   RDW 15.9 (H) 01/13/2018 0501   LYMPHSABS 1.8 01/03/2018 1056   MONOABS 2.2 (H) 01/03/2018 1056   EOSABS 0.4 01/03/2018 1056   BASOSABS 0.0 01/03/2018 1056     Assessment/Plan:  1. AKI/CKD stage 3- non-oliguric with multiple renal insults including ischemic ATN in setting of hypotension/ABLA, NSAIDs, ACE-I and diuretics. Pt had  worsening BUN/Cr as well as AMS and some asterixis and initiated on HD 01/07/18 and completed 3 sessions. 1. S/p temporary HD cath byIRon 01/07/18 and first HD session 2. Marked improvement in MS after3HD  sessions 3. Good UOPand hopefully will see improvement in BUN/Cr between HD sessions soonas rate of rise has markedly slowed, however he may need HD after blood transfusion. 4. Will hold off on HD for now given increased UOP and slower rate of rise in BUN/Cr. 2. ABLA- s/p BKA, continues to drop.  Recommend transfusion with 2 units PRBC's with Lasix 80mg  IV between units and follow.  3. PVD s/p LBKA 01/04/18- per Dr. Lajoyce Corners, wound vac in place 4. HTN- will add lasix to help with BP and consider hydralazine if needed. 5. AMS- presumably combination of anesthesia and uremia. Has shown marked improvement since HD sessions. 6. Hyperkalemia- as above,resolved withHD 7. Elevated WBC-improving. 8. Moderate protein malnutrition- consider nepro supplements   Irena Cords, MD [facility] [phone]

## 2018-01-13 NOTE — Progress Notes (Signed)
   Subjective: Patient was feeling better when seen this morning.  Getting his blood transfusion.  Denies any new complaints.  Objective:  Vital signs in last 24 hours: Vitals:   01/13/18 0659 01/13/18 1012 01/13/18 1354 01/13/18 2043  BP: (!) 151/64 (!) 166/73 (!) 174/65 (!) 158/74  Pulse: 79 85 70 84  Resp: 17  16 18   Temp: 99.1 F (37.3 C) 98.6 F (37 C) 99.4 F (37.4 C) 99 F (37.2 C)  TempSrc: Oral Oral Oral Oral  SpO2: 95% 95% 95% 94%  Weight:      Height:       General.  Well-developed, alert and oriented x3 gentleman, in no acute distress. Lungs.  Clear bilaterally, normal work of breathing. CV.  Regular rate and rhythm. Abdomen.  Soft, nontender, bowel sounds positive. Extremities.  Left BKA with wound VAC in place.  1+ right lower extremity edema.  Assessment/Plan:  AKI: Secondary to sepsis, intraoperative blood loss.  Mild worsening of renal function without hemodialysis for 2 days.  Making good amount of urine, 1400 mL over last 24-hour.  Current creatinine at 5.58 with BUN of 42. -Continue to monitor renal function with the hope that it will improve. -Neurology is following-appreciate their recommendations.  Anemia: Hemoglobin dropped to 6.9 again, getting 1 unit of packed RBC.  Iron studies with little low iron at 24 with low TIBC.  FOBT still pending as patient did not had any bowel movement for the last 2 days. -Start him on ferrous sulfate orally. -Might need Epo.  We will wait with anticipation of recovery of his renal functions.  Wet gangrene and Sepsiss/p amputation.  Completed antibiotics.  No sign of infection, wound site appears good.  Wound VAC with minimum drainage.  Hypertension.  Mostly within goal with few readings of elevated blood pressure. -Continue amlodipine and metoprolol.  Dispo: Anticipated discharge in approximately 2-3 day(s).  Depending on his renal function.  Arnetha CourserAmin, Aleyah Balik, MD 01/13/2018, 9:13 PM Pager: [phone]

## 2018-01-13 NOTE — Progress Notes (Signed)
CRITICAL VALUE ALERT  Critical Value:  Hgb 6.9  Date & Time Notied:  01/13/18 0610  Provider Notified: Internal medicine teaching service  Orders Received/Actions taken:Notified physician on call Awaiting orders

## 2018-01-14 LAB — RENAL FUNCTION PANEL
ANION GAP: 12 (ref 5–15)
Albumin: 2.4 g/dL — ABNORMAL LOW (ref 3.5–5.0)
BUN: 46 mg/dL — ABNORMAL HIGH (ref 8–23)
CHLORIDE: 100 mmol/L (ref 98–111)
CO2: 28 mmol/L (ref 22–32)
Calcium: 8.5 mg/dL — ABNORMAL LOW (ref 8.9–10.3)
Creatinine, Ser: 6.28 mg/dL — ABNORMAL HIGH (ref 0.61–1.24)
GFR calc Af Amer: 10 mL/min — ABNORMAL LOW (ref >60)
GFR calc non Af Amer: 8 mL/min — ABNORMAL LOW (ref >60)
GLUCOSE: 112 mg/dL — AB (ref 70–99)
POTASSIUM: 4.7 mmol/L (ref 3.5–5.1)
Phosphorus: 5.7 mg/dL — ABNORMAL HIGH (ref 2.5–4.6)
Sodium: 140 mmol/L (ref 135–145)

## 2018-01-14 LAB — BPAM RBC
BLOOD PRODUCT EXPIRATION DATE: 201907312359
BLOOD PRODUCT EXPIRATION DATE: 201908062359
ISSUE DATE / TIME: 201907070636
ISSUE DATE / TIME: 201907050416
Unit Type and Rh: 5100
Unit Type and Rh: 5100

## 2018-01-14 LAB — TYPE AND SCREEN
ABO/RH(D): O POS
Antibody Screen: NEGATIVE
UNIT DIVISION: 0
Unit division: 0

## 2018-01-14 LAB — CBC
HEMATOCRIT: 30.1 % — AB (ref 39.0–52.0)
HEMOGLOBIN: 9.6 g/dL — AB (ref 13.0–17.0)
MCH: 27.7 pg (ref 26.0–34.0)
MCHC: 31.9 g/dL (ref 30.0–36.0)
MCV: 86.7 fL (ref 78.0–100.0)
Platelets: 302 10*3/uL (ref 150–400)
RBC: 3.47 MIL/uL — ABNORMAL LOW (ref 4.22–5.81)
RDW: 14.9 % (ref 11.5–15.5)
WBC: 9.7 10*3/uL (ref 4.0–10.5)

## 2018-01-14 LAB — GLUCOSE, CAPILLARY
Glucose-Capillary: 137 mg/dL — ABNORMAL HIGH (ref 70–99)
Glucose-Capillary: 93 mg/dL (ref 70–99)
Glucose-Capillary: 125 mg/dL — ABNORMAL HIGH (ref 70–99)
Glucose-Capillary: 119 mg/dL — ABNORMAL HIGH (ref 70–99)

## 2018-01-14 MED ORDER — AMLODIPINE BESYLATE 10 MG PO TABS
10.0000 mg | ORAL_TABLET | Freq: Every day | ORAL | Status: DC
  Administered 2018-01-15 – 2018-01-23 (×9): 10 mg via ORAL
  Filled 2018-01-14 (×7): qty 1
  Filled 2018-01-14: qty 2
  Filled 2018-01-14: qty 1

## 2018-01-14 MED ORDER — SODIUM CHLORIDE 0.9 % IV SOLN
INTRAVENOUS | Status: DC
  Administered 2018-01-14: 16:00:00 via INTRAVENOUS

## 2018-01-14 MED ORDER — SODIUM CHLORIDE 0.9 % IV SOLN
510.0000 mg | Freq: Once | INTRAVENOUS | Status: AC
  Administered 2018-01-14: 510 mg via INTRAVENOUS
  Filled 2018-01-14: qty 17

## 2018-01-14 MED ORDER — BISACODYL 10 MG RE SUPP
10.0000 mg | Freq: Once | RECTAL | Status: AC
  Administered 2018-01-14: 10 mg via RECTAL
  Filled 2018-01-14: qty 1

## 2018-01-14 MED ORDER — RENA-VITE PO TABS
1.0000 | ORAL_TABLET | Freq: Every day | ORAL | Status: DC
  Administered 2018-01-14 – 2018-01-23 (×10): 1 via ORAL
  Filled 2018-01-14 (×10): qty 1

## 2018-01-14 NOTE — Progress Notes (Signed)
Physical Therapy Treatment Patient Details Name: Joel Hebert MRN: [id number] DOB: 07-27-1955 Today's Date: 01/14/2018    History of Present Illness Pt is a 62 y.o. 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 PMH includes initial L 5th toe amp 10/09/17,  DMII, PAD, PVD, MI, HTN.    PT Comments    [name] doing well today. A&O x 4 and participating in conversation without needing redirection. Does require instructions x 2 regarding process of progressing gait and mobility with RW. Able to perform hop-to pattern with gait with RW for 8-10 feet today with appropriate safety awareness throughout. Making good progress towards goals. PT to continue to follow acutely to maximize functional mobility.     Follow Up Recommendations  SNF;Supervision for mobility/OOB     Equipment Recommendations  None recommended by PT(TBD at next venue of care)    Recommendations for Other Services       Precautions / Restrictions Precautions Precautions: Fall Precaution Comments: L BKA wound vac Restrictions Weight Bearing Restrictions: Yes [name] Weight Bearing: Non weight bearing    Mobility  Bed Mobility Overal bed mobility: Needs Assistance Bed Mobility: Supine to Sit     Supine to sit: HOB elevated;Min guard     General bed mobility comments: for safety; good awareness of NWB at L LE  Transfers Overall transfer level: Needs assistance Equipment used: Rolling walker (2 wheeled) Transfers: Sit to/from [employer] Sit to Stand: Min assist;From elevated surface;+2 safety/equipment Stand pivot transfers: Min assist;+2 safety/equipment       General transfer comment: Min A for transfers for safety; appropriate safety awareness with RW  Ambulation/Gait Ambulation/Gait assistance: Min guard Gait Distance (Feet): (8-10) Assistive device: Rolling walker (2  wheeled) Gait Pattern/deviations: Step-to pattern(hop to pattern)     General Gait Details: patient able to take small hops with RW today - increased height of RW for efficiency   Stairs             Wheelchair Mobility    Modified Rankin (Stroke Patients Only)       Balance Overall balance assessment: Needs assistance Sitting-balance support: Single extremity supported;Feet supported Sitting balance-Leahy Scale: Fair     Standing balance support: Bilateral upper extremity supported;During functional activity Standing balance-Leahy Scale: Poor                              Cognition Arousal/Alertness: Awake/alert Behavior During Therapy: Flat affect Overall Cognitive Status: Impaired/Different from baseline Area of Impairment: Following commands;Safety/judgement;Problem solving                       Following Commands: Follows one step commands with increased time Safety/Judgement: Decreased awareness of safety;Decreased awareness of deficits   Problem Solving: Slow processing;Decreased initiation;Difficulty sequencing;Requires verbal cues;Requires tactile cues General Comments: improving compared to prior notes/session      Exercises      General Comments        Pertinent Vitals/Pain Pain Assessment: No/denies pain    Home Living                      Prior Function            PT Goals (current goals can now be found in the care plan section) Acute Rehab PT Goals Patient Stated  Goal: decrease pain Time For Goal Achievement: 01/19/18 Potential to Achieve Goals: Good Progress towards PT goals: Progressing toward goals    Frequency    Min 3X/week      PT Plan Current plan remains appropriate    Co-evaluation              AM-PAC PT "6 Clicks" Daily Activity  Outcome Measure  Difficulty turning over in bed (including adjusting bedclothes, sheets and blankets)?: A Little Difficulty moving from lying on  back to sitting on the side of the bed? : A Little Difficulty sitting down on and standing up from a chair with arms (e.g., wheelchair, bedside commode, etc,.)?: Unable Help needed moving to and from a bed to chair (including a wheelchair)?: A Little Help needed walking in hospital room?: A Little Help needed climbing 3-5 steps with a railing? : Total 6 Click Score: 14    End of Session Equipment Utilized During Treatment: Gait belt Activity Tolerance: Patient tolerated treatment well Patient left: in chair;with call bell/phone within reach;with chair alarm set Nurse Communication: Mobility status PT Visit Diagnosis: Other abnormalities of gait and mobility (R26.89);Difficulty in walking, not elsewhere classified (R26.2) Pain - Right/Left: Left Pain - part of body: [name]     Time: 0135-0159 PT Time Calculation (min) (ACUTE ONLY): 24 min  Charges:  $Gait Training: 8-22 mins $Therapeutic Activity: 8-22 mins                    G Codes:       Kipp LaurenceStephanie R Aaron, PT, DPT 01/14/18 4:02 PM

## 2018-01-14 NOTE — Progress Notes (Signed)
Subjective: Interval History: has no complaint , good appetite, no N,V.  Objective: Vital signs in last 24 hours: Temp:  [98.3 F (36.8 C)-99.4 F (37.4 C)] 98.6 F (37 C) (07/08 0518) Pulse Rate:  [70-84] 78 (07/08 0518) Resp:  [16-20] 16 (07/08 0518) BP: (136-174)/(65-81) 156/73 (07/08 0518) SpO2:  [94 %-96 %] 96 % (07/08 0518) Weight change:   Intake/Output from previous day: 07/07 0701 - 07/08 0700 In: 350 [Blood:350] Out: -  Intake/Output this shift: Total I/O In: -  Out: 750 [Urine:750]  General appearance: alert, cooperative and no distress Neck: RIJ cath Resp: rhonchi bilaterally Cardio: S1, S2 normal and systolic murmur: systolic ejection 2/6, decrescendo at 2nd left intercostal space GI: pos bs soft Extremities: BKA L,  2-3+ edema  Lab Results: Recent Labs    01/13/18 0501 01/13/18 1939 01/14/18 0528  WBC 11.2*  --  9.7  HGB 6.9* 9.1* 9.6*  HCT 22.0* 28.7* 30.1*  PLT 239  --  302   BMET:  Recent Labs    01/13/18 0501 01/14/18 0528  NA 138 140  K 4.0 4.7  CL 101 100  CO2 30 28  GLUCOSE 112* 112*  BUN 42* 46*  CREATININE 5.58* 6.28*  CALCIUM 8.1* 8.5*   No results for input(s): PTH in the last 72 hours. Iron Studies: No results for input(s): IRON, TIBC, TRANSFERRIN, FERRITIN in the last 72 hours.  Studies/Results: No results found.  I have reviewed the patient's current medications.  Assessment/Plan: 1 AKI Cr rising, mod vol xs. Acid/base/K ok. Making more urine. Will hold off HD today but if not better HD in am.ETIO hemodynamic, ACEI, blood loss, NSAIDs 2 Anemia give iv Fe 3 PVD 4 DM per primary P follow chem, urine vol, clinically    LOS: 11 days   Fayrene FearingJames Anais Koenen 01/14/2018,12:32 PM

## 2018-01-14 NOTE — Progress Notes (Signed)
   Subjective: Joel Hebert is doing great today. Was eating breakfast as we saw him. Denies any headache, dizziness, nausea or vomiting. Has not had any bowel movement yet. Asks about taking off the foley catheter.   Objective:  Vital signs in last 24 hours: Vitals:   01/13/18 1354 01/13/18 2043 01/13/18 2113 01/14/18 0518  BP: (!) 174/65 (!) 158/74 136/81 (!) 156/73  Pulse: 70 84 72 78  Resp: 16 18 20 16   Temp: 99.4 F (37.4 C) 99 F (37.2 C) 98.3 F (36.8 C) 98.6 F (37 C)  TempSrc: Oral Oral Oral Oral  SpO2: 95% 94%  96%  Weight:      Height:       Ph/E:  Alert and oriented, in no acute distress Cardio: Nl S1S2, no murmur Respiratory: Nl breath sounds, no wheeze or rale. Extremities: Left leg has the drain at the site of amputation. Wound area looks normal. with no sign of infection  Rt big toe with dry gangrene has not change comparing to last examination. Upper extremities distal pulses are normal and symetric  Assessment/Plan:   Wet gangrene of and Sepsis: Resolved. S/p amputation Wound is healing well and no infection at the site of amputation   AKI: Secondary to sepsis. S/pHD x3. Has been off of HD for last 2 days. Today: BUN:46, Cr:6.26. Per nephrology, plan is holding next HD for another day or 2 to evaluate kidney function.  -Monitor BMP  Acute on chronic Anemia: s/p transfusion yesterday --> post transfusion Hb: 9.1. Today Hb:9.6 Patient has had intermittant drop in his Hb, can be due to kidney disease. FOBT order is active but he did not have any bowel movement yet. -Bisacodyl supp -FOBT -Monitor CBC   HTN: Stable today BP:157/73 -Continue Amlodipine 10 mg QD  HLD: -Continue Crestor 20 mg QD   Type 2 DM: Today capilary glucos:125 -Continue sliding scale Insulin   Dispo: Anticipated discharge in approximately 2-3 day(s) based on renal function improvement  Chevis PrettyMasoudi, Lavoris Sparling, MD 01/14/2018, 1:41 PM Pager: [phone]

## 2018-01-15 LAB — RENAL FUNCTION PANEL
Albumin: 2.2 g/dL — ABNORMAL LOW (ref 3.5–5.0)
Anion gap: 11 (ref 5–15)
BUN: 52 mg/dL — ABNORMAL HIGH (ref 8–23)
CHLORIDE: 98 mmol/L (ref 98–111)
CO2: 28 mmol/L (ref 22–32)
Calcium: 8.4 mg/dL — ABNORMAL LOW (ref 8.9–10.3)
Creatinine, Ser: 6.77 mg/dL — ABNORMAL HIGH (ref 0.61–1.24)
GFR calc non Af Amer: 8 mL/min — ABNORMAL LOW (ref >60)
GFR, EST AFRICAN AMERICAN: 9 mL/min — AB (ref >60)
Glucose, Bld: 101 mg/dL — ABNORMAL HIGH (ref 70–99)
POTASSIUM: 4.5 mmol/L (ref 3.5–5.1)
Phosphorus: 5.8 mg/dL — ABNORMAL HIGH (ref 2.5–4.6)
Sodium: 137 mmol/L (ref 135–145)

## 2018-01-15 LAB — GLUCOSE, CAPILLARY
GLUCOSE-CAPILLARY: 154 mg/dL — AB (ref 70–99)
Glucose-Capillary: 94 mg/dL (ref 70–99)
Glucose-Capillary: 129 mg/dL — ABNORMAL HIGH (ref 70–99)
Glucose-Capillary: 110 mg/dL — ABNORMAL HIGH (ref 70–99)

## 2018-01-15 LAB — CBC
HEMATOCRIT: 25.8 % — AB (ref 39.0–52.0)
Hemoglobin: 8.1 g/dL — ABNORMAL LOW (ref 13.0–17.0)
MCH: 27.3 pg (ref 26.0–34.0)
MCHC: 31.4 g/dL (ref 30.0–36.0)
MCV: 86.9 fL (ref 78.0–100.0)
Platelets: 326 10*3/uL (ref 150–400)
RBC: 2.97 MIL/uL — AB (ref 4.22–5.81)
RDW: 14.9 % (ref 11.5–15.5)
WBC: 8.6 10*3/uL (ref 4.0–10.5)

## 2018-01-15 MED ORDER — PENTAFLUOROPROP-TETRAFLUOROETH EX AERO: 1.0000 "application " | INHALATION_SPRAY | CUTANEOUS | Status: DC | PRN

## 2018-01-15 MED ORDER — HEPARIN SODIUM (PORCINE) 1000 UNIT/ML DIALYSIS
40.0000 [IU]/kg | Freq: Once | INTRAMUSCULAR | Status: DC
  Filled 2018-01-15: qty 4

## 2018-01-15 MED ORDER — HEPARIN SODIUM (PORCINE) 1000 UNIT/ML DIALYSIS
100.0000 [IU]/kg | INTRAMUSCULAR | Status: DC | PRN
  Filled 2018-01-15: qty 10

## 2018-01-15 MED ORDER — SODIUM CHLORIDE 0.9 % IV SOLN: 100.0000 mL | INTRAVENOUS | Status: DC | PRN

## 2018-01-15 MED ORDER — LIDOCAINE-PRILOCAINE 2.5-2.5 % EX CREA
1.0000 "application " | TOPICAL_CREAM | CUTANEOUS | Status: DC | PRN
  Filled 2018-01-15: qty 5

## 2018-01-15 MED ORDER — LIDOCAINE HCL (PF) 1 % IJ SOLN
5.0000 mL | INTRAMUSCULAR | Status: DC | PRN
  Filled 2018-01-15: qty 5

## 2018-01-15 MED ORDER — CHLORHEXIDINE GLUCONATE CLOTH 2 % EX PADS
6.0000 | MEDICATED_PAD | Freq: Every day | CUTANEOUS | Status: DC
  Administered 2018-01-15 – 2018-01-23 (×5): 6 via TOPICAL

## 2018-01-15 MED ORDER — ALTEPLASE 2 MG IJ SOLR
2.0000 mg | Freq: Once | INTRAMUSCULAR | Status: DC | PRN
  Filled 2018-01-15: qty 2

## 2018-01-15 MED ORDER — HEPARIN SODIUM (PORCINE) 1000 UNIT/ML DIALYSIS
1000.0000 [IU] | INTRAMUSCULAR | Status: DC | PRN
  Filled 2018-01-15: qty 1

## 2018-01-15 NOTE — Procedures (Signed)
I was present at this session.  I have reviewed the session itself and made appropriate changes.  HD via temp cath. Initially some issues with flow,now better. bp ^. To get off 3.5 Joel AlimentL  Joel Hebert 7/9/20194:04 PM

## 2018-01-15 NOTE — Progress Notes (Signed)
Subjective: Interval History: has no complaint of feels better.  Objective: Vital signs in last 24 hours: Temp:  [98.4 F (36.9 C)-99.6 F (37.6 C)] 99.6 F (37.6 C) (07/09 0509) Pulse Rate:  [80-96] 88 (07/09 0509) Resp:  [18] 18 (07/09 0509) BP: (148-184)/(70-84) 160/72 (07/09 0509) SpO2:  [89 %-92 %] 89 % (07/09 0509) Weight:  [100.1 kg (220 lb 10.9 oz)-101 kg (222 lb 10.6 oz)] 100.1 kg (220 lb 10.9 oz) (07/09 0500) Weight change:   Intake/Output from previous day: 07/08 0701 - 07/09 0700 In: 610 [P.O.:600; I.V.:10] Out: 1405 [Urine:1400; Drains:5] Intake/Output this shift: No intake/output data recorded.  General appearance: alert, cooperative, no distress and mildly obese Resp: diminished breath sounds bilaterally and rales bibasilar Cardio: S1, S2 normal and systolic murmur: holosystolic 2/6, blowing at apex GI: pos bs , soft Extremities: edema 2-3+ and L BKA  Lab Results: Recent Labs    01/14/18 0528 01/15/18 0755  WBC 9.7 8.6  HGB 9.6* 8.1*  HCT 30.1* 25.8*  PLT 302 326   BMET:  Recent Labs    01/14/18 0528 01/15/18 0755  NA 140 137  K 4.7 4.5  CL 100 98  CO2 28 28  GLUCOSE 112* 101*  BUN 46* 52*  CREATININE 6.28* 6.77*  CALCIUM 8.5* 8.4*   No results for input(s): PTH in the last 72 hours. Iron Studies: No results for input(s): IRON, TIBC, TRANSFERRIN, FERRITIN in the last 72 hours.  Studies/Results: No results found.  I have reviewed the patient's current medications.  Assessment/Plan: 1 AKI nonoliguric but little solute clearance.  Vol xs , will do HD today. Should recover soon. Etio low bp, hemorr, arb , contrast, NSAIDs 2 Anemia got Fe 3 DM controlled 4 PVD post BKA 5 HTn needs lower vol P HD, diet, monitor bs, Cr    LOS: 12 days   Joel Hebert 01/15/2018,10:43 AM

## 2018-01-15 NOTE — Progress Notes (Signed)
   Subjective: I saw [name] this morning. He is doing well. No nausea vomiting or dizziness. He asks about if he needs HD for long term.  Objective:  Vital signs in last 24 hours: Vitals:   01/15/18 1410 01/15/18 1415 01/15/18 1420 01/15/18 1445  BP: (!) 175/81 (!) 192/76 (!) 187/76 (!) 156/75  Pulse: 72 74 74 76  Resp: 20 18 17 16   Temp: (!) 97.5 F (36.4 C) 97.6 F (36.4 C)    TempSrc: Oral Oral    SpO2: 94% 93%    Weight:  220 lb 14.4 oz (100.2 kg)    Height:       Ph/E: Alert and orineted, in no acute distress Cardio: Nl S1S2, systolic murmur Lungs: CTA bilaterally, no wheeze or rale Extremities: left lower extremity amputation site is intact  Assessment/Plan:  Sepsis: Resolved. Asymptomatic post Antibiotic therapy  Wet Gangrene: s/p left leg amputation. No sign of infection at the site of amputation   Uremia/encephalopathy: Resolved. Patient has not had any mental status changes, nausea, vomiting.  AKI: today: BUN 52 and Cr:6.77   K:4.5  (Yesterday: BUN:46 and Cr:6.2) -HD today -Monitor BMP   Anemia: Hb today dropped to 8.1 today again from 9.6 yesterday. S/p Ferumoxytol yesterday. Anemia is probably in the setting of chronic disease/kidney disease. Should consider EPO. May be prescribed by nephrology at the time of HD.  Also, FOBT order is active to R/o underlying GI bleeding. I prescribed Bisacodyl yesterday and notified the nurse to collect the sample. Per patient, he has not collected any sample though.  -Monitor CBC -FOBT   HTN: today BP:187/78-->156/75. Is on Amlodipin 10 mg QD. Per nephrology: lower volume needed.  HLD: -Continue Crestor 20 mg QD   Type 2 DM: Today capilary glucos:129 -Continue sliding scale Insulin     Dispo: Anticipated to recover soon. Still needed HD today. Anticipate to discharge in approximately 2-3   days.  Joel Hebert, Joel Tirado, MD 01/15/2018, 5:14 PM Pager: @[phone]

## 2018-01-15 NOTE — Progress Notes (Signed)
Physical Therapy Treatment Patient Details Name: Joel Hebert MRN: [id number] DOB: 10/27/1955 Today's Date: 01/15/2018    History of Present Illness Pt is a 62 y.o. 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 PMH includes initial L 5th toe amp 10/09/17,  DMII, PAD, PVD, MI, HTN.    PT Comments    Patient received in bed. Willing to participate with PT, but unable to progress mobility due to transport arriving for HD. Patient completing bed mobility with Min guard/min A for safety with appropriate awareness of L LE NWB status. Mod A for sit to stand today, but from lower bed position to improve mobility from various levels and surfaces. Tends to prefer a flexed position of residual limb at knee - concerning for possible contracture. May benefit from education/stretching at next session. PT to continue to follow.    Follow Up Recommendations  SNF;Supervision for mobility/OOB     Equipment Recommendations  None recommended by PT(TBD at next venue of care)    Recommendations for Other Services       Precautions / Restrictions Precautions Precautions: Fall Precaution Comments: L BKA wound vac Restrictions Weight Bearing Restrictions: Yes [name] Weight Bearing: Non weight bearing    Mobility  Bed Mobility Overal bed mobility: Needs Assistance Bed Mobility: Supine to Sit;Sit to Supine     Supine to sit: HOB elevated;Min guard Sit to supine: HOB elevated;Min assist   General bed mobility comments: supine to sit with good awareness of NWB L LE and maintaining throughout. Sit to supine with Min A for R LE management and positioning  Transfers Overall transfer level: Needs assistance Equipment used: Rolling walker (2 wheeled) Transfers: Sit to/from Stand Sit to Stand: Mod assist(from lower surface)         General transfer comment: Mod A due to lower bed  positioning; verbal cues for hand placement as patient prefers to pull up from RW  Ambulation/Gait Ambulation/Gait assistance: Min guard   Assistive device: Rolling walker (2 wheeled) Gait Pattern/deviations: (side stepping at bedside)     General Gait Details: side stepping (with hop to pattern) at bedside for optimal bed positioning; unable to progress gait due to transport arriving for HD   Stairs             Wheelchair Mobility    Modified Rankin (Stroke Patients Only)       Balance Overall balance assessment: Needs assistance Sitting-balance support: Single extremity supported;Feet supported Sitting balance-Leahy Scale: Good     Standing balance support: Bilateral upper extremity supported;During functional activity Standing balance-Leahy Scale: Poor                              Cognition Arousal/Alertness: Awake/alert Behavior During Therapy: Flat affect Overall Cognitive Status: Impaired/Different from baseline Area of Impairment: Following commands;Safety/judgement                       Following Commands: Follows one step commands with increased time Safety/Judgement: Decreased awareness of safety;Decreased awareness of deficits            Exercises      General Comments        Pertinent Vitals/Pain Pain Assessment: No/denies pain    Home Living  Prior Function            PT Goals (current goals can now be found in the care plan section) Acute Rehab PT Goals Patient Stated Goal: decrease pain Time For Goal Achievement: 01/19/18 Potential to Achieve Goals: Good Progress towards PT goals: Progressing toward goals    Frequency    Min 3X/week      PT Plan Current plan remains appropriate    Co-evaluation              AM-PAC PT "6 Clicks" Daily Activity  Outcome Measure  Difficulty turning over in bed (including adjusting bedclothes, sheets and blankets)?: A  Little Difficulty moving from lying on back to sitting on the side of the bed? : A Little Difficulty sitting down on and standing up from a chair with arms (e.g., wheelchair, bedside commode, etc,.)?: Unable Help needed moving to and from a bed to chair (including a wheelchair)?: A Little Help needed walking in hospital room?: A Little Help needed climbing 3-5 steps with a railing? : Total 6 Click Score: 14    End of Session Equipment Utilized During Treatment: Gait belt Activity Tolerance: Patient tolerated treatment well Patient left: in bed;with call bell/phone within reach Nurse Communication: Mobility status PT Visit Diagnosis: Other abnormalities of gait and mobility (R26.89);Difficulty in walking, not elsewhere classified (R26.2) Pain - Right/Left: Left Pain - part of body: [name]     Time: 1315-1331 PT Time Calculation (min) (ACUTE ONLY): 16 min  Charges:  $Therapeutic Activity: 8-22 mins                    G Codes:       Kipp Laurence, PT, DPT 01/15/18 2:47 PM Pager: [phone]

## 2018-01-16 DIAGNOSIS — Z8639 Personal history of other endocrine, nutritional and metabolic disease: Secondary | ICD-10-CM

## 2018-01-16 LAB — CBC
HCT: 26 % — ABNORMAL LOW (ref 39.0–52.0)
HEMOGLOBIN: 8.2 g/dL — AB (ref 13.0–17.0)
MCH: 27.2 pg (ref 26.0–34.0)
MCHC: 31.5 g/dL (ref 30.0–36.0)
MCV: 86.4 fL (ref 78.0–100.0)
PLATELETS: 298 10*3/uL (ref 150–400)
RBC: 3.01 MIL/uL — AB (ref 4.22–5.81)
RDW: 14.7 % (ref 11.5–15.5)
WBC: 6.8 10*3/uL (ref 4.0–10.5)

## 2018-01-16 LAB — RENAL FUNCTION PANEL
ALBUMIN: 2.3 g/dL — AB (ref 3.5–5.0)
ANION GAP: 11 (ref 5–15)
BUN: 30 mg/dL — ABNORMAL HIGH (ref 8–23)
CALCIUM: 8.2 mg/dL — AB (ref 8.9–10.3)
CO2: 28 mmol/L (ref 22–32)
Chloride: 97 mmol/L — ABNORMAL LOW (ref 98–111)
Creatinine, Ser: 4.78 mg/dL — ABNORMAL HIGH (ref 0.61–1.24)
GFR calc non Af Amer: 12 mL/min — ABNORMAL LOW (ref >60)
GFR, EST AFRICAN AMERICAN: 14 mL/min — AB (ref >60)
Glucose, Bld: 92 mg/dL (ref 70–99)
POTASSIUM: 4.1 mmol/L (ref 3.5–5.1)
Phosphorus: 4.3 mg/dL (ref 2.5–4.6)
SODIUM: 136 mmol/L (ref 135–145)

## 2018-01-16 LAB — GLUCOSE, CAPILLARY
GLUCOSE-CAPILLARY: 115 mg/dL — AB (ref 70–99)
Glucose-Capillary: 97 mg/dL (ref 70–99)
Glucose-Capillary: 146 mg/dL — ABNORMAL HIGH (ref 70–99)
Glucose-Capillary: 83 mg/dL (ref 70–99)

## 2018-01-16 MED ORDER — HYDROCORTISONE 1 % EX CREA
1.0000 "application " | TOPICAL_CREAM | Freq: Three times a day (TID) | CUTANEOUS | Status: DC | PRN
  Filled 2018-01-16: qty 28

## 2018-01-16 NOTE — Progress Notes (Signed)
   Subjective: Mr. Whitney PostLogan was doing well when we visited him today. Was eating his breakfast. Does not have any compliant. Was able to walk around yesterday. I explained him that we need more drop in his Cr level. He asks what he can do to make his Cr better. We told him that there is nothing that he should do at this moment and we will monitor his kidney function and plan accordingly.  Objective:  Vital signs in last 24 hours: Vitals:   01/15/18 2057 01/16/18 0619 01/16/18 1121 01/16/18 1340  BP: (!) 162/66 (!) 167/77 (!) 159/77 (!) 151/71  Pulse: 88 78 81 70  Resp: 16 12  20   Temp: 98.7 F (37.1 C) 98.7 F (37.1 C)  98.5 F (36.9 C)  TempSrc: Oral Oral  Oral  SpO2: (!) 89% 95% 91% (!) 87%  Weight:  212 lb 15.4 oz (96.6 kg)    Height:       Physical Exam  Constitutional: Pleasant gentle man. Sitting in no distress.  Cardiovascular: Normal rate, regular rhythm and normal heart sounds.  Pulmonary/Chest: Effort normal and breath sounds normal. He has no wheezes. He has no rales.  Abdominal: Soft. There is no tenderness.  Extremities: Left leg BKA amputation site looks normal with drainage tube in place  Assessment/Plan:   Resolved. Asymptomatic post Antibiotic therapy  1)Sepsis, Wet Gangrene: s/p left BKA on 06.28. No sign of infection at the site of amputation.    2)Uremia/encephalopathy: Resolved. He has been alert and awake. No nausea, vomittiing, dizziness.  3)HTN: BP:151/71 -Stop IV infusion -Continue Amlodipin 10 mg QD  4)Acute on chronic anemia s/p pcell. Currently stable. Possibly due to kidney disease. Hx of B12 deficiency. On B12  CBC Latest Ref Rng & Units 01/16/2018 01/15/2018 01/14/2018  WBC 4.0 - 10.5 K/uL 6.8 8.6 9.7  Hemoglobin 13.0 - 17.0 g/dL 8.2(L) 8.1(L) 9.6(L)  Hematocrit 39.0 - 52.0 % 26.0(L) 25.8(L) 30.1(L)  Platelets 150 - 400 K/uL 298 326 302   -B12 level -FOBT -Monitor CBC daily  5)DM:  CBG (last 3)     01/15/18 2058 01/16/18 0801  01/16/18 1151  GLUCAP 154* 97 146*   -Continue Sliding scale  Dispo: Anticipated discharge in approximately few days, based on renal function improvement.   Chevis PrettyMasoudi, Camia Dipinto, MD 01/16/2018, 3:28 PM Pager: [phone]

## 2018-01-16 NOTE — Progress Notes (Signed)
  [facility] Progress Note    Assessment/ Plan:   62y/o man DM PAD s/p TMA 2 weeks ago and then Lt BKA this hospitalization with AKI secondary to NSAID, IV contrast and hypotension started on dialysis.  1 AKI nonoliguric but little solute clearance.  Should recover soon. Etio low bp, hemorr, arb , contrast, NSAIDs - s/p HD on 7/9 - no acute indication for HD today -> will continue to evaluate daily. Hopefully won't need dialysis as outpt waiting for renal recovery. 2 Anemia got Fe 3 DM controlled 4 PVD post lt BKA 5 HTn needs lower vol P HD, diet, monitor bs, Cr           Subjective:   Feels well; denies f/c/n/v/dyspnea/cp.  I&O [id number] (UOP 700, UF 3L) UOP was 1400 prior day   Objective:   BP (!) 167/77 (BP Location: Left Arm)   Pulse 78   Temp 98.7 F (37.1 C) (Oral)   Resp 12   Ht 5\' 8"  (1.727 m)   Wt 96.6 kg (212 lb 15.4 oz)   SpO2 95%   BMI 32.38 kg/m   Intake/Output Summary (Last 24 hours) at 01/16/2018 0907 Last data filed at 01/16/2018 [id number] Gross per 24 hour  Intake 600 ml  Output 3700 ml  Net -3100 ml   Weight change: -0.8 kg (-1 lb 12.2 oz)  Physical Exam: General appearance: alert, cooperative, no distress and mildly obese Resp: diminished breath sounds bilaterally and rales bibasilar Cardio: S1, S2 normal and systolic murmur: holosystolic 2/6, blowing at apex GI: pos bs , soft Extremities: edema 1+ and L BKA w/ wound vac GU: foley in place    Imaging: No results found.  Labs: BMET Recent Labs  Lab 01/11/18 0048 01/11/18 0749 01/12/18 0710 01/13/18 0501 01/14/18 0528 01/15/18 0755 01/16/18 0648  NA 138 139 139 138 140 137 136  K 3.4* 3.6 3.7 4.0 4.7 4.5 4.1  CL 97* 99 98 101 100 98 97*  CO2 33* 29 29 30 28 28 28   GLUCOSE 127* 111* 108* 112* 112* 101* 92  BUN 54* 32* 36* 42* 46* 52* 30*  CREATININE 5.97* 4.14* 4.99* 5.58* 6.28* 6.77* 4.78*  CALCIUM 7.7* 7.6* 8.3* 8.1* 8.5* 8.4* 8.2*  PHOS 5.4* 3.8 4.5 5.3* 5.7* 5.8*  4.3   CBC Recent Labs  Lab 01/12/18 0710 01/13/18 0501 01/13/18 1939 01/14/18 0528 01/15/18 0755  WBC 12.2* 11.2*  --  9.7 8.6  HGB 7.4* 6.9* 9.1* 9.6* 8.1*  HCT 23.6* 22.0* 28.7* 30.1* 25.8*  MCV 86.1 85.9  --  86.7 86.9  PLT 230 239  --  302 326    Medications:    . amLODipine  10 mg Oral QHS  . aspirin EC  81 mg Oral BID  . Chlorhexidine Gluconate Cloth  6 each Topical Q0600  . clopidogrel  75 mg Oral Q breakfast  . docusate sodium  100 mg Oral BID  . gabapentin  300 mg Oral QHS  . heparin  40 Units/kg Dialysis Once in dialysis  . insulin aspart  0-5 Units Subcutaneous QHS  . insulin aspart  0-9 Units Subcutaneous TID WC  . mouth rinse  15 mL Mouth Rinse BID  . metoprolol tartrate  100 mg Oral BID  . multivitamin  1 tablet Oral QHS  . rosuvastatin  20 mg Oral QPM  . vitamin B-12  1,000 mcg Oral Daily      Paulene FloorJames Kaylib Furness, MD 01/16/2018, 9:07 AM

## 2018-01-16 NOTE — Progress Notes (Signed)
Provider paged: Order needed for IV team to draw labs from HD cath and to check on indication of foley. Provider to place orders.

## 2018-01-16 NOTE — Progress Notes (Signed)
[place] [facility] [place] ,in bed comfortably .Awake alert and oriented x 4.Skin tear on his left buttock as assessed with Cristy HiltsJay R.N.Applied thick barrier cream and educated patient on shifting his position in bed.

## 2018-01-16 NOTE — Clinical Social Work Note (Signed)
Clinical Social Work Assessment  Patient Details  Name: Joel Hebert MRN: [id number] Date of Birth: 07/26/1955  Date of referral:  01/15/18               Reason for consult:  Facility Placement                Permission sought to share information with:  Facility Medical sales representativeContact Representative, Family Supports Permission granted to share information::     Name::     Joel Hebert  Agency::  [facility]  Relationship::  Wife  Contact Information:  [phone]  Housing/Transportation Living arrangements for the past 2 months:  Skilled Nursing Facility Source of Information:  Spouse Patient Interpreter Needed:  None Criminal Activity/Legal Involvement Pertinent to Current Situation/Hospitalization:  No - Comment as needed Significant Relationships:  Spouse Lives with:  Spouse Do you feel safe going back to the place where you live?  Yes Need for family participation in patient care:  Yes (Comment)  Care giving concerns:  CSW received consult for discharge needs. Patient has been residing at [facility] [place]. Patient understands and agrees with returning to [facility] at discharge.   Social Worker assessment / plan:  CSW spoke with patient regarding return to [facility].  Employment status:  Disabled (Comment on whether or not currently receiving Disability) Insurance information:  Self Pay (Medicaid Pending) PT Recommendations:  Skilled Nursing Facility Information / Referral to community resources:  Skilled Nursing Facility  Patient/Family's Response to care: Patient recognizes need  and is agreeable to returning to [facility].    Patient/Family's Understanding of and Emotional Response to Diagnosis, Current Treatment, and Prognosis:  Patient is realistic regarding therapy needs and expressed being hopeful of getting better.  Patient expressed understanding of CSW role and discharge process as well as medical condition. No questions/concerns about plan or treatment.    Emotional Assessment Appearance:  Appears stated age Attitude/Demeanor/Rapport:  Unable to Assess Affect (typically observed):  Unable to Assess Orientation:  Oriented to Self, Oriented to Place, Oriented to  Time, Oriented to Situation Alcohol / Substance use:  Not Applicable Psych involvement (Current and /or in the community):  No (Comment)  Discharge Needs  Concerns to be addressed:  Discharge Planning Concerns Readmission within the last 30 days:    Current discharge risk:  None Barriers to Discharge:  Continued Medical Work up   Enterprise ProductsCynthia N Saksham Akkerman, LCSWA 01/16/2018, 3:34 PM

## 2018-01-16 NOTE — NC FL2 (Signed)
[place] MEDICAID FL2 LEVEL OF CARE SCREENING TOOL     IDENTIFICATION  Patient Name: Joel Hebert Birthdate: 1956-04-21 Sex: male Admission Date (Current Location): 01/03/2018  [facility] and [place] Number:  Producer, television/film/video and Address:  [facility] [place], [address] [place], [place] [address]      Provider Number: [id number]  Attending Physician Name and Address:  Doneen Poisson, MD  Relative Name and Phone Number:  Benay Pike [phone]    Current Level of Care: Hospital Recommended Level of Care: Skilled Nursing Facility Prior Approval Number:    Date Approved/Denied:   PASRR Number: [id number]  Discharge Plan: SNF    Current Diagnoses: Patient Active Problem List   Diagnosis Date Noted  . Anemia   . Uremia due to inadequate renal perfusion   . Acute renal failure (HCC)   . Sepsis (HCC) 01/03/2018  . Dehiscence of amputation stump (HCC) 12/27/2017  . History of transmetatarsal amputation of left foot (HCC) 12/14/2017  . Gangrene of left foot (HCC)   . Atherosclerosis of native artery of left lower extremity with gangrene (HCC) 11/19/2017  . Peripheral artery disease (HCC) 11/08/2017  . PAD (peripheral artery disease) (HCC) 08/14/2017    Orientation RESPIRATION BLADDER Height & Weight     Self, Time, Situation, Place  Normal Continent Weight: 212 lb 15.4 oz (96.6 kg) Height:  5\' 8"  (172.7 cm)  BEHAVIORAL SYMPTOMS/MOOD NEUROLOGICAL BOWEL NUTRITION STATUS  Dangerous to self, others or property   Continent Diet(see Dischage Summary)  AMBULATORY STATUS COMMUNICATION OF NEEDS Skin   Independent Verbally Surgical wounds, PU Stage and Appropriate Care, Wound Vac(incision , closed left leg, incision open, coccyx neagative pressure wound vac left leg)                       Personal Care Assistance Level of Assistance  Bathing, Feeding, Dressing Bathing Assistance: Independent Feeding assistance: Independent Dressing  Assistance: Independent     Functional Limitations Info  Sight, Hearing, Speech Sight Info: Adequate Hearing Info: Adequate Speech Info: Adequate    SPECIAL CARE FACTORS FREQUENCY  PT (By licensed PT), OT (By licensed OT)     PT Frequency: 5x per wk OT Frequency: 3x per wk            Contractures Contractures Info: Not present    Additional Factors Info  Code Status, Allergies, Insulin Sliding Scale Code Status Info: FULL Allergies Info: NKA   Insulin Sliding Scale Info: 3x daily w/meals , daily at bedtime       Current Medications (01/16/2018):  This is the current hospital active medication list Current Facility-Administered Medications  Medication Dose Route Frequency Provider Last Rate Last Dose  . 0.9 %  sodium chloride infusion  100 mL Intravenous PRN Deterding, Fayrene Fearing, MD      . 0.9 %  sodium chloride infusion  100 mL Intravenous PRN Deterding, Fayrene Fearing, MD      . acetaminophen (TYLENOL) suppository 650 mg  650 mg Rectal Q4H PRN Masoudi, Elhamalsadat, MD   650 mg at 01/08/18 2022  . acetaminophen (TYLENOL) tablet 325-650 mg  325-650 mg Oral Q6H PRN Arnetha Courser, MD      . alteplase (CATHFLO ACTIVASE) injection 2 mg  2 mg Intracatheter Once PRN Beryle Lathe, MD      . amLODipine (NORVASC) tablet 10 mg  10 mg Oral Vernetta Honey, MD   10 mg at 01/15/18 2115  . aspirin EC tablet  81 mg  81 mg Oral BID Arnetha CourserAmin, Sumayya, MD   81 mg at 01/16/18 1122  . bisacodyl (DULCOLAX) suppository 10 mg  10 mg Rectal Daily PRN Arnetha CourserAmin, Sumayya, MD      . Chlorhexidine Gluconate Cloth 2 % PADS 6 each  6 each Topical [id number] Beryle Latheeterding, James, MD   6 each at 01/16/18 [phone]  . clopidogrel (PLAVIX) tablet 75 mg  75 mg Oral Q breakfast Arnetha CourserAmin, Sumayya, MD   75 mg at 01/16/18 1122  . docusate sodium (COLACE) capsule 100 mg  100 mg Oral BID Arnetha CourserAmin, Sumayya, MD   100 mg at 01/16/18 1122  . gabapentin (NEURONTIN) capsule 300 mg  300 mg Oral QHS Arnetha CourserAmin, Sumayya, MD   300 mg at 01/15/18 2115  . heparin  injection 1,000 Units  1,000 Units Dialysis PRN Deterding, Fayrene FearingJames, MD      . heparin injection 10,000 Units  100 Units/kg Dialysis PRN Deterding, Fayrene FearingJames, MD      . heparin injection 4,000 Units  40 Units/kg Dialysis Once in dialysis Deterding, Fayrene FearingJames, MD      . hydrocortisone cream 1 % 1 application  1 application Topical TID PRN Doneen PoissonKlima, Lawrence, MD      . HYDROmorphone (DILAUDID) injection 0.5-1 mg  0.5-1 mg Intravenous Q4H PRN Arnetha CourserAmin, Sumayya, MD   1 mg at 01/04/18 2351  . insulin aspart (novoLOG) injection 0-5 Units  0-5 Units Subcutaneous QHS Amin, Tilman NeatSumayya, MD      . insulin aspart (novoLOG) injection 0-9 Units  0-9 Units Subcutaneous TID WC Arnetha CourserAmin, Sumayya, MD   1 Units at 01/16/18 1242  . lidocaine (PF) (XYLOCAINE) 1 % injection 5 mL  5 mL Intradermal PRN Deterding, Fayrene FearingJames, MD      . lidocaine (PF) (XYLOCAINE) 1 % injection    PRN Simonne ComeWatts, John, MD   2 mL at 01/07/18 1246  . lidocaine-prilocaine (EMLA) cream 1 application  1 application Topical PRN Deterding, Fayrene FearingJames, MD      . magnesium citrate solution 1 Bottle  1 Bottle Oral Once PRN Arnetha CourserAmin, Sumayya, MD      . methocarbamol (ROBAXIN) tablet 500 mg  500 mg Oral Q6H PRN Arnetha CourserAmin, Sumayya, MD   500 mg at 01/16/18 1245   Or  . methocarbamol (ROBAXIN) 500 mg in dextrose 5 % 50 mL IVPB  500 mg Intravenous Q6H PRN Arnetha CourserAmin, Sumayya, MD      . metoCLOPramide (REGLAN) tablet 5-10 mg  5-10 mg Oral Q8H PRN Arnetha CourserAmin, Sumayya, MD       Or  . metoCLOPramide (REGLAN) injection 5-10 mg  5-10 mg Intravenous Q8H PRN Arnetha CourserAmin, Sumayya, MD      . metoprolol tartrate (LOPRESSOR) injection 5 mg  5 mg Intravenous Q6H PRN Reymundo PollGuilloud, Carolyn, MD      . metoprolol tartrate (LOPRESSOR) tablet 100 mg  100 mg Oral BID Arnetha CourserAmin, Sumayya, MD   100 mg at 01/16/18 1122  . multivitamin (RENA-VIT) tablet 1 tablet  1 tablet Oral Vernetta HoneyQHS Deterding, James, MD   1 tablet at 01/15/18 2116  . ondansetron (ZOFRAN) tablet 4 mg  4 mg Oral Q6H PRN Arnetha CourserAmin, Sumayya, MD       Or  . ondansetron (ZOFRAN) injection 4 mg  4 mg  Intravenous Q6H PRN Arnetha CourserAmin, Sumayya, MD      . oxyCODONE (Oxy IR/ROXICODONE) immediate release tablet 10-15 mg  10-15 mg Oral Q4H PRN Arnetha CourserAmin, Sumayya, MD   15 mg at 01/16/18 [id number]  . oxyCODONE (Oxy IR/ROXICODONE) immediate release tablet 5-10 mg  5-10 mg Oral Q4H PRN Arnetha Courser, MD   10 mg at 01/11/18 0444  . pentafluoroprop-tetrafluoroeth (GEBAUERS) aerosol 1 application  1 application Topical PRN Deterding, Fayrene Fearing, MD      . polyethylene glycol (MIRALAX / GLYCOLAX) packet 17 g  17 g Oral Daily PRN Arnetha Courser, MD   17 g at 01/14/18 0847  . rosuvastatin (CRESTOR) tablet 20 mg  20 mg Oral QPM Arnetha Courser, MD   20 mg at 01/15/18 1850  . senna-docusate (Senokot-S) tablet 1 tablet  1 tablet Oral QHS PRN Arnetha Courser, MD      . vitamin B-12 (CYANOCOBALAMIN) tablet 1,000 mcg  1,000 mcg Oral Daily Arnetha Courser, MD   1,000 mcg at 01/16/18 1122     Discharge Medications: Please see discharge summary for a list of discharge medications.  Relevant Imaging Results:  Relevant Lab Results:   Additional Information SSN [id number]  Eduard Roux, LCSWA

## 2018-01-17 ENCOUNTER — Inpatient Hospital Stay (INDEPENDENT_AMBULATORY_CARE_PROVIDER_SITE_OTHER): Payer: Self-pay | Admitting: Orthopedic Surgery

## 2018-01-17 LAB — CBC
HCT: 25.6 % — ABNORMAL LOW (ref 39.0–52.0)
Hemoglobin: 8.2 g/dL — ABNORMAL LOW (ref 13.0–17.0)
MCH: 27.6 pg (ref 26.0–34.0)
MCHC: 32 g/dL (ref 30.0–36.0)
MCV: 86.2 fL (ref 78.0–100.0)
PLATELETS: 333 10*3/uL (ref 150–400)
RBC: 2.97 MIL/uL — AB (ref 4.22–5.81)
RDW: 14.7 % (ref 11.5–15.5)
WBC: 7.2 10*3/uL (ref 4.0–10.5)

## 2018-01-17 LAB — GLUCOSE, CAPILLARY
GLUCOSE-CAPILLARY: 84 mg/dL (ref 70–99)
GLUCOSE-CAPILLARY: 101 mg/dL — AB (ref 70–99)
Glucose-Capillary: 121 mg/dL — ABNORMAL HIGH (ref 70–99)
Glucose-Capillary: 118 mg/dL — ABNORMAL HIGH (ref 70–99)

## 2018-01-17 LAB — RENAL FUNCTION PANEL
Albumin: 2.3 g/dL — ABNORMAL LOW (ref 3.5–5.0)
Anion gap: 13 (ref 5–15)
BUN: 38 mg/dL — ABNORMAL HIGH (ref 8–23)
CALCIUM: 8.6 mg/dL — AB (ref 8.9–10.3)
CHLORIDE: 96 mmol/L — AB (ref 98–111)
CO2: 28 mmol/L (ref 22–32)
CREATININE: 5.75 mg/dL — AB (ref 0.61–1.24)
GFR calc non Af Amer: 9 mL/min — ABNORMAL LOW (ref >60)
GFR, EST AFRICAN AMERICAN: 11 mL/min — AB (ref >60)
Glucose, Bld: 87 mg/dL (ref 70–99)
Phosphorus: 5.1 mg/dL — ABNORMAL HIGH (ref 2.5–4.6)
Potassium: 4.2 mmol/L (ref 3.5–5.1)
SODIUM: 137 mmol/L (ref 135–145)

## 2018-01-17 LAB — VITAMIN B12: VITAMIN B 12: 940 pg/mL — AB (ref 180–914)

## 2018-01-17 NOTE — Progress Notes (Signed)
  Joel Hebert Progress Note    Assessment/ Plan:   62y/o man DM PAD s/p TMA 2 weeks ago and then Lt BKA this hospitalization with AKI secondary to NSAID, IV contrast and hypotension started on dialysis.  1 AKI nonoliguric but little solute clearance. Should recover soon. Etio low bp, hemorr, arb , contrast, NSAIDs - s/p HD on 7/9 - no acute indication for HD again today -> will continue to evaluate daily. Hopefully won't need dialysis as outpt waiting for renal recovery.  - UOP good, volume status ok, cr incr but would like to allow him to equilibrate and recover. - Next HD if needed (either Fri or Sat) will be with no UF to maximize perfusion.  2 Anemia got Fe 3 DM controlled 4 PVD post lt BKA 5 HTn needs lower vol P HD, diet, monitor bs, Cr    Subjective:   Feels well; denies f/c/n/v/dyspnea/cp.  I&O [id number] but still another 700 in bag that will be recorded    Objective:   BP (!) 174/79 (BP Location: Left Arm)   Pulse 76   Temp 98.6 F (37 C) (Oral)   Resp 18   Ht 5\' 8"  (1.727 m)   Wt 99.8 kg (220 lb 0.3 oz)   SpO2 91%   BMI 33.45 kg/m   Intake/Output Summary (Last 24 hours) at 01/17/2018 0749 Last data filed at 01/17/2018 0600 Gross per 24 hour  Intake 684 ml  Output 650 ml  Net 34 ml   Weight change: -3.601 kg (-7 lb 15 oz)  Physical Exam: General appearance:alert, cooperative, no distress and mildly obese Resp:diminished breath soundsbilaterallyand no rales at the base Cardio:S1, S2 normal and systolic murmur:holosystolic2/6,blowingat apex GI:pos bs , soft Extremities:edema1+and L BKA w/ wound vac fell off when uncovering sheets   Imaging: No results found.  Labs: BMET Recent Labs  Lab 01/11/18 0749 01/12/18 0710 01/13/18 0501 01/14/18 0528 01/15/18 0755 01/16/18 0648 01/17/18 0555  NA 139 139 138 140 137 136 137  K 3.6 3.7 4.0 4.7 4.5 4.1 4.2  CL 99 98 101 100 98 97* 96*  CO2 29 29 30 28 28 28 28    GLUCOSE 111* 108* 112* 112* 101* 92 87  BUN 32* 36* 42* 46* 52* 30* 38*  CREATININE 4.14* 4.99* 5.58* 6.28* 6.77* 4.78* 5.75*  CALCIUM 7.6* 8.3* 8.1* 8.5* 8.4* 8.2* 8.6*  PHOS 3.8 4.5 5.3* 5.7* 5.8* 4.3 5.1*   CBC Recent Labs  Lab 01/14/18 0528 01/15/18 0755 01/16/18 1133 01/17/18 0555  WBC 9.7 8.6 6.8 7.2  HGB 9.6* 8.1* 8.2* 8.2*  HCT 30.1* 25.8* 26.0* 25.6*  MCV 86.7 86.9 86.4 86.2  PLT 302 326 298 333    Medications:    . amLODipine  10 mg Oral QHS  . aspirin EC  81 mg Oral BID  . Chlorhexidine Gluconate Cloth  6 each Topical Q0600  . clopidogrel  75 mg Oral Q breakfast  . docusate sodium  100 mg Oral BID  . gabapentin  300 mg Oral QHS  . heparin  40 Units/kg Dialysis Once in dialysis  . insulin aspart  0-5 Units Subcutaneous QHS  . insulin aspart  0-9 Units Subcutaneous TID WC  . metoprolol tartrate  100 mg Oral BID  . multivitamin  1 tablet Oral QHS  . rosuvastatin  20 mg Oral QPM  . vitamin B-12  1,000 mcg Oral Daily      Joel FloorJames Rosann Gorum, MD 01/17/2018, 7:49 AM

## 2018-01-17 NOTE — Progress Notes (Signed)
   Subjective: We visited [name] today. He is doing well. Has no complaint. Feeling better after Foley cath replaced with condom cath  Objective:  Vital signs in last 24 hours: Vitals:   01/16/18 2104 01/17/18 0519 01/17/18 0655 01/17/18 0918  BP: (!) 173/79 (!) 174/79  (!) 167/68  Pulse: 81 76  85  Resp: 17 18  17   Temp: 98.5 F (36.9 C) 98.6 F (37 C)  98.6 F (37 C)  TempSrc:  Oral  Oral  SpO2: (!) 89% 91%  (!) 86%  Weight: 212 lb 15.4 oz (96.6 kg)  220 lb 0.3 oz (99.8 kg)   Height:       Ph/E:Physical Exam   Constitutional: He is oriented to person, place, and time.  Head: Normocephalic and atraumatic.  Cardiovascular: Normal rate, regular rhythm and normal heart sounds.  Pulmonary/Chest: No respiratory distress. He has no wheezes. He has no rales.  Abdominal: He exhibits no distension. There is no tenderness.  Neurological: He is alert and oriented to person, place, and time. Extremities: Rt BKA site is intact, right dry gangrene on first toe has not changed. Rt lower extremity pulse is palpated but week. (same as before). Upper extremity pulses are normal bilateraly  Assessment/Plan:  1)AKI: In setting of volume loss post surgery. S/P HDx3 Last HD was 2 days ago. Cr:5.75 today BUN:38 with some increase since yesterday. Had 650 ml urine output yesterday. Nephrology is on board. Will monitor his kidney function and plan will be made for HD accordingly  BMP Latest Ref Rng & Units 01/17/2018 01/16/2018 01/15/2018  Glucose 70 - 99 mg/dL 87 92 130(Q101(H)  BUN 8 - 23 mg/dL 65(H38(H) 84(O30(H) 96(E52(H)  Creatinine 0.61 - 1.24 mg/dL 9.52(W5.75(H) 4.13(K4.78(H) 4.40(N6.77(H)  Sodium 135 - 145 mmol/L 137 136 137  Potassium 3.5 - 5.1 mmol/L 4.2 4.1 4.5  Chloride 98 - 111 mmol/L 96(L) 97(L) 98  CO2 22 - 32 mmol/L 28 28 28   Calcium 8.9 - 10.3 mg/dL 0.2(V8.6(L) 2.5(D8.2(L) 6.6(Y8.4(L)  -Monitor BMP QD  2)Uremia/encephalopathy:Resolved. He has been alert and awake. No nausea, vomittiing, dizziness.  3)Acute on chronic anemia  s/p pcell. Currently stable. Possibly due to kidney disease. Hx of B12 deficiency. On B12. B12 level highly elevated. Hb 8.2 (similar to yesterday result), Plt:333000, WBC: 7.2 -Monitor Hb QD  4)HTN: BP:167/68/71 -Continue Amlodipin 10 mg QD  5)DM: stable -Continue Sliding scale  6)Sepsis, Wet Gangrene: Resolved. s/pleft BKA on 06.28.  site of amputation is intact. Drain tube is in place  -FOBT ordered -Monitor CBC daily       Dispo: Patient's kidney function is improving but still has small increase in BUN and Cr without HD. may need more HD. Anticipated discharge in less than a week  Joel Hebert, Joel Nevitt, MD 01/17/2018, 9:29 AM Pager: [phone]

## 2018-01-17 NOTE — Progress Notes (Signed)
Physical Therapy Treatment Patient Details Name: Joel Hebert MRN: [id number] DOB: Jul 07, 1956 Today's Date: 01/17/2018    History of Present Illness Pt is a 62 y.o. 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 PMH includes initial L 5th toe amp 10/09/17,  DMII, PAD, PVD, MI, HTN.    PT Comments    Patient with good progress with increased encouragement provided by PT. Ambulating 25 feet in room with walker and min guard assist. Also focused on residual limb proximal strengthening. Educated extensively on residual limb positioning promoting extension and expectations for prosthetic use; patient verbalized understanding but will likely need reinforcement.     Follow Up Recommendations  SNF;Supervision for mobility/OOB     Equipment Recommendations  None recommended by PT(TBD at next venue of care)    Recommendations for Other Services       Precautions / Restrictions Precautions Precautions: Fall Precaution Comments: L BKA wound vac Restrictions Weight Bearing Restrictions: Yes [name] Weight Bearing: Non weight bearing    Mobility  Bed Mobility Overal bed mobility: Needs Assistance Bed Mobility: Supine to Sit     Supine to sit: HOB elevated;Min guard     General bed mobility comments: patient with increased time and effort with use of bed rails  Transfers Overall transfer level: Needs assistance Equipment used: Rolling walker (2 wheeled) Transfers: Sit to/from Stand Sit to Stand: From elevated surface;Min assist         General transfer comment: Patient prefers to pull up through RW but explained that this is not a safe technique  Ambulation/Gait Ambulation/Gait assistance: Min guard Gait Distance (Feet): 25 Feet Assistive device: Rolling walker (2 wheeled) Gait Pattern/deviations: (hop to pattern)     General Gait Details: patient with moderate  reliance on BUE through walker and cues for walker proximity   Stairs             Wheelchair Mobility    Modified Rankin (Stroke Patients Only)       Balance Overall balance assessment: Needs assistance Sitting-balance support: Single extremity supported;Feet supported Sitting balance-Leahy Scale: Good     Standing balance support: Bilateral upper extremity supported;During functional activity Standing balance-Leahy Scale: Poor                              Cognition Arousal/Alertness: Awake/alert Behavior During Therapy: Flat affect Overall Cognitive Status: Impaired/Different from baseline Area of Impairment: Following commands;Safety/judgement                       Following Commands: Follows one step commands with increased time Safety/Judgement: Decreased awareness of safety;Decreased awareness of deficits            Exercises General Exercises - Lower Extremity Quad Sets: 15 reps;Supine;Left Heel Slides: 15 reps;Left;Supine Hip ABduction/ADduction: 15 reps;Left;Supine Straight Leg Raises: 15 reps;Left;Supine    General Comments        Pertinent Vitals/Pain Pain Assessment: Faces Faces Pain Scale: Hurts a little bit Pain Location: L LE around incision site Pain Descriptors / Indicators: Sore Pain Intervention(s): Monitored during session    Home Living                      Prior Function            PT Goals (current goals can now  be found in the care plan section) Acute Rehab PT Goals Patient Stated Goal: decrease pain Time For Goal Achievement: 01/19/18 Potential to Achieve Goals: Good Progress towards PT goals: Progressing toward goals    Frequency    Min 3X/week      PT Plan Current plan remains appropriate    Co-evaluation              AM-PAC PT "6 Clicks" Daily Activity  Outcome Measure  Difficulty turning over in bed (including adjusting bedclothes, sheets and blankets)?: A  Little Difficulty moving from lying on back to sitting on the side of the bed? : A Little Difficulty sitting down on and standing up from a chair with arms (e.g., wheelchair, bedside commode, etc,.)?: Unable Help needed moving to and from a bed to chair (including a wheelchair)?: A Little Help needed walking in hospital room?: A Little Help needed climbing 3-5 steps with a railing? : Total 6 Click Score: 14    End of Session Equipment Utilized During Treatment: Gait belt Activity Tolerance: Patient tolerated treatment well Patient left: with call bell/phone within reach;in chair;with chair alarm set Nurse Communication: Mobility status PT Visit Diagnosis: Other abnormalities of gait and mobility (R26.89);Difficulty in walking, not elsewhere classified (R26.2) Pain - Right/Left: Left Pain - part of body: [name]     Time: [id number] PT Time Calculation (min) (ACUTE ONLY): 31 min  Charges:  $Gait Training: 8-22 mins $Therapeutic Exercise: 8-22 mins                    G Codes:      Laurina Bustlearoline Fidelia Cathers, PT, DPT Acute Rehabilitation Services  Pager: [phone]   Vanetta MuldersCarloine H Seraj Dunnam 01/17/2018, 12:21 PM

## 2018-01-18 LAB — RENAL FUNCTION PANEL
ALBUMIN: 2.4 g/dL — AB (ref 3.5–5.0)
Anion gap: 13 (ref 5–15)
BUN: 46 mg/dL — AB (ref 8–23)
CO2: 27 mmol/L (ref 22–32)
CREATININE: 6.24 mg/dL — AB (ref 0.61–1.24)
Calcium: 8.6 mg/dL — ABNORMAL LOW (ref 8.9–10.3)
Chloride: 97 mmol/L — ABNORMAL LOW (ref 98–111)
GFR calc Af Amer: 10 mL/min — ABNORMAL LOW (ref >60)
GFR, EST NON AFRICAN AMERICAN: 9 mL/min — AB (ref >60)
GLUCOSE: 90 mg/dL (ref 70–99)
PHOSPHORUS: 5.5 mg/dL — AB (ref 2.5–4.6)
POTASSIUM: 4.2 mmol/L (ref 3.5–5.1)
SODIUM: 137 mmol/L (ref 135–145)

## 2018-01-18 LAB — GLUCOSE, CAPILLARY
GLUCOSE-CAPILLARY: 111 mg/dL — AB (ref 70–99)
Glucose-Capillary: 123 mg/dL — ABNORMAL HIGH (ref 70–99)
Glucose-Capillary: 134 mg/dL — ABNORMAL HIGH (ref 70–99)

## 2018-01-18 MED ORDER — HYDRALAZINE HCL 10 MG PO TABS
10.0000 mg | ORAL_TABLET | Freq: Three times a day (TID) | ORAL | Status: DC
  Administered 2018-01-18 – 2018-01-19 (×2): 10 mg via ORAL
  Filled 2018-01-18 (×2): qty 1

## 2018-01-18 NOTE — Clinical Social Work Note (Signed)
CSW continuing to follow patient's progress, and he is being followed and treated by nephrology regarding his renal function and will receive dialysis today. CSW will continue to follow and facilitate [facility].  Genelle BalVanessa Lameshia Hypolite, MSW, LCSW Licensed Clinical Social Worker Clinical Social Work Department [facility] [phone]

## 2018-01-18 NOTE — Progress Notes (Signed)
   Subjective: We saw and evaluated [name] at HD unit today. He was doing ok. No complaint or symptoms  Objective:  Vital signs in last 24 hours: Vitals:   01/18/18 1130 01/18/18 1200 01/18/18 1208 01/18/18 1712  BP: (!) 163/72 (!) 155/79 (!) 171/86 (!) 166/79  Pulse: 85 84 84 74  Resp:   18 18  Temp:   98.6 F (37 C) 98.9 F (37.2 C)  TempSrc:   Oral Oral  SpO2:   96% 92%  Weight:   211 lb 10.3 oz (96 kg)   Height:      Physical Exam  Cardiovascular: Normal rate and regular rhythm.  Pulmonary/Chest: No respiratory distress. rale at lateral side of chest (Ph/E was limited since patinet was at HD) Abdominal: Soft.  Rt toe dry gangrene is with no change. Dorsalis pedis pulse is a week but palpable  Assessment/Plan:  1)AKI: Is getting his 4th HD  -BMP daily   2)Uremia/encephalopathy:Resolved.He has been alert and awake. No nausea, vomittiing, dizziness, chest pain  3)Acute on chronic anemia s/p pcell.Currently stable. Possibly due to kidney disease.  (Today CBC held at time of HD) -Monitor Hb   4)HTN: BP:164/672---> post HD:171/86, PR:84 -Continue Amlodipin 10 mg QD, Metoprolol 100 mg BID -Start Hydralazin 10 mg q10 h  5)DM:stable -Continue Sliding scale  6)Sepsis,Wet Gangrene: Resolved. S/o left BKA 06.28.  site of amputation is intact. Drain tube is in place     Dispo: Anticipated discharge in approximately 2-3 day(s).   Chevis PrettyMasoudi, Chales Pelissier, MD 01/18/2018, 6:55 PM Pager: @[phone] @

## 2018-01-18 NOTE — Progress Notes (Signed)
  [facility] Progress Note    Assessment/ Plan:   62y/o man DM PAD s/p TMA 2 weeks ago and then Lt BKA this hospitalization with AKI secondary to NSAID, IV contrast and hypotension started on dialysis.  1 AKI nonoliguric but little solute clearance. Should recover soon. Etio low bp, hemorr, arb , contrast, NSAIDs - s/p HD on 7/9 - renal function continues to decline -> plan on HD today with no UF; will keep even. Still waiting for renal recovery.   2 Anemia got Fe 3 DM controlled 4 PVD postltBKA 5 HTn needs lower vol P HD, diet, monitor bs, Cr    Subjective:   Feels well; denies f/c/n/v/dyspnea/cp. Good appetite.    Objective:   BP (!) 164/72 (BP Location: Left Arm)   Pulse 73   Temp 99.3 F (37.4 C) (Oral)   Resp 12   Ht 5\' 8"  (1.727 m)   Wt 96.7 kg (213 lb 3 oz)   SpO2 93%   BMI 32.41 kg/m   Intake/Output Summary (Last 24 hours) at 01/18/2018 0759 Last data filed at 01/18/2018 0700 Gross per 24 hour  Intake 1630 ml  Output 1300 ml  Net 330 ml   Weight change: 0.101 kg (3.6 oz)  Physical Exam: General appearance:alert, cooperative, no distress and mildly obese Resp:diminished breath soundsbilaterallyand no rales at the base Cardio:S1, S2 normal and systolic murmur GI:pos bs , soft Extremities:edema1+and L BKAw/ wound vac is intact Access: RIJ temp    Imaging: No results found.  Labs: BMET Recent Labs  Lab 01/12/18 0710 01/13/18 0501 01/14/18 0528 01/15/18 0755 01/16/18 0648 01/17/18 0555 01/18/18 0438  NA 139 138 140 137 136 137 137  K 3.7 4.0 4.7 4.5 4.1 4.2 4.2  CL 98 101 100 98 97* 96* 97*  CO2 29 30 28 28 28 28 27   GLUCOSE 108* 112* 112* 101* 92 87 90  BUN 36* 42* 46* 52* 30* 38* 46*  CREATININE 4.99* 5.58* 6.28* 6.77* 4.78* 5.75* 6.24*  CALCIUM 8.3* 8.1* 8.5* 8.4* 8.2* 8.6* 8.6*  PHOS 4.5 5.3* 5.7* 5.8* 4.3 5.1* 5.5*   CBC Recent Labs  Lab 01/14/18 0528 01/15/18 0755 01/16/18 1133 01/17/18 0555   WBC 9.7 8.6 6.8 7.2  HGB 9.6* 8.1* 8.2* 8.2*  HCT 30.1* 25.8* 26.0* 25.6*  MCV 86.7 86.9 86.4 86.2  PLT 302 326 298 333    Medications:    . amLODipine  10 mg Oral QHS  . aspirin EC  81 mg Oral BID  . Chlorhexidine Gluconate Cloth  6 each Topical Q0600  . clopidogrel  75 mg Oral Q breakfast  . docusate sodium  100 mg Oral BID  . gabapentin  300 mg Oral QHS  . insulin aspart  0-5 Units Subcutaneous QHS  . insulin aspart  0-9 Units Subcutaneous TID WC  . metoprolol tartrate  100 mg Oral BID  . multivitamin  1 tablet Oral QHS  . rosuvastatin  20 mg Oral QPM  . vitamin B-12  1,000 mcg Oral Daily      Paulene FloorJames Malekai Markwood, MD 01/18/2018, 7:59 AM

## 2018-01-19 LAB — RENAL FUNCTION PANEL
ALBUMIN: 2.5 g/dL — AB (ref 3.5–5.0)
Anion gap: 10 (ref 5–15)
BUN: 26 mg/dL — AB (ref 8–23)
CO2: 30 mmol/L (ref 22–32)
CREATININE: 4.47 mg/dL — AB (ref 0.61–1.24)
Calcium: 8.4 mg/dL — ABNORMAL LOW (ref 8.9–10.3)
Chloride: 98 mmol/L (ref 98–111)
GFR calc Af Amer: 15 mL/min — ABNORMAL LOW (ref >60)
GFR, EST NON AFRICAN AMERICAN: 13 mL/min — AB (ref >60)
Glucose, Bld: 99 mg/dL (ref 70–99)
PHOSPHORUS: 3.9 mg/dL (ref 2.5–4.6)
POTASSIUM: 3.5 mmol/L (ref 3.5–5.1)
Sodium: 138 mmol/L (ref 135–145)

## 2018-01-19 LAB — GLUCOSE, CAPILLARY
GLUCOSE-CAPILLARY: 99 mg/dL (ref 70–99)
GLUCOSE-CAPILLARY: 128 mg/dL — AB (ref 70–99)
Glucose-Capillary: 112 mg/dL — ABNORMAL HIGH (ref 70–99)
Glucose-Capillary: 118 mg/dL — ABNORMAL HIGH (ref 70–99)

## 2018-01-19 MED ORDER — HYDRALAZINE HCL 50 MG PO TABS
50.0000 mg | ORAL_TABLET | Freq: Three times a day (TID) | ORAL | Status: DC
  Administered 2018-01-19: 50 mg via ORAL
  Filled 2018-01-19: qty 1

## 2018-01-19 MED ORDER — HYDRALAZINE HCL 50 MG PO TABS
50.0000 mg | ORAL_TABLET | Freq: Three times a day (TID) | ORAL | Status: DC
  Administered 2018-01-19 – 2018-01-21 (×5): 50 mg via ORAL
  Filled 2018-01-19 (×5): qty 1

## 2018-01-19 MED ORDER — HYDRALAZINE HCL 25 MG PO TABS: 25.0000 mg | ORAL_TABLET | Freq: Three times a day (TID) | ORAL | Status: DC

## 2018-01-19 NOTE — Progress Notes (Signed)
  [facility] Progress Note    Assessment/ Plan:   62y/o man DM PAD s/p TMA 2 weeks ago and then Lt BKA this hospitalization with AKI secondary to NSAID, IV contrast and hypotension started on dialysis.  1 AKI nonoliguric but little solute clearance. Should recover soon. Etio low bp, hemorr, arb , contrast, NSAIDs - s/p HD on 7/12  - will monitor renal function daily as UOP is very good. - plan on holding HD for now; hopefully he's in the diuretic phase of ATN.   2 Anemia got Fe 3 DM controlled 4 PVD postltBKA 5 HTn needs lower vol    Subjective:   Feels well; denies f/c/n/v/dyspnea/cp. Good appetite.  States he has great UOP. No problems at HD yest.   Objective:   BP (!) 174/79 (BP Location: Left Arm)   Pulse 75   Temp 98.8 F (37.1 C) (Oral)   Resp 14   Ht 5\' 8"  (1.727 m)   Wt 94.6 kg (208 lb 8.9 oz)   SpO2 94%   BMI 31.71 kg/m   Intake/Output Summary (Last 24 hours) at 01/19/2018 0739 Last data filed at 01/19/2018 [id number] Gross per 24 hour  Intake 120 ml  Output 1080 ml  Net -960 ml   Weight change: -0.7 kg (-1 lb 8.7 oz)  Physical Exam: General appearance:alert, cooperative, no distress and mildly obese Resp:diminished breath soundsbilaterallyandnoralesat the base Cardio:S1, S2 normal and systolic murmur GI:pos bs , soft Extremities:edematrand L BKAw/ wound vacis intact Access: RIJ temp     Imaging: No results found.  Labs: BMET Recent Labs  Lab 01/13/18 0501 01/14/18 0528 01/15/18 0755 01/16/18 0648 01/17/18 0555 01/18/18 0438 01/19/18 0354  NA 138 140 137 136 137 137 138  K 4.0 4.7 4.5 4.1 4.2 4.2 3.5  CL 101 100 98 97* 96* 97* 98  CO2 30 28 28 28 28 27 30   GLUCOSE 112* 112* 101* 92 87 90 99  BUN 42* 46* 52* 30* 38* 46* 26*  CREATININE 5.58* 6.28* 6.77* 4.78* 5.75* 6.24* 4.47*  CALCIUM 8.1* 8.5* 8.4* 8.2* 8.6* 8.6* 8.4*  PHOS 5.3* 5.7* 5.8* 4.3 5.1* 5.5* 3.9   CBC Recent Labs  Lab 01/14/18 0528  01/15/18 0755 01/16/18 1133 01/17/18 0555  WBC 9.7 8.6 6.8 7.2  HGB 9.6* 8.1* 8.2* 8.2*  HCT 30.1* 25.8* 26.0* 25.6*  MCV 86.7 86.9 86.4 86.2  PLT 302 326 298 333    Medications:    . amLODipine  10 mg Oral QHS  . aspirin EC  81 mg Oral BID  . Chlorhexidine Gluconate Cloth  6 each Topical Q0600  . clopidogrel  75 mg Oral Q breakfast  . docusate sodium  100 mg Oral BID  . gabapentin  300 mg Oral QHS  . hydrALAZINE  25 mg Oral Q8H  . insulin aspart  0-5 Units Subcutaneous QHS  . insulin aspart  0-9 Units Subcutaneous TID WC  . metoprolol tartrate  100 mg Oral BID  . multivitamin  1 tablet Oral QHS  . rosuvastatin  20 mg Oral QPM  . vitamin B-12  1,000 mcg Oral Daily      Paulene FloorJames Chonda Baney, MD 01/19/2018, 7:39 AM

## 2018-01-19 NOTE — Progress Notes (Signed)
   Subjective: [name] is doing good. Was eating breakfast when I visited him. Has no complaint.   Objective:  Vital signs in last 24 hours: Vitals:   01/19/18 0126 01/19/18 0429 01/19/18 0759 01/19/18 1452  BP:  (!) 174/79 (!) 165/76 (!) 166/73  Pulse:  75 74 73  Resp:  14 18 16   Temp:  98.8 F (37.1 C) 98.4 F (36.9 C) 98.3 F (36.8 C)  TempSrc:  Oral Oral Oral  SpO2:  94% 92% 95%  Weight: 208 lb 8.9 oz (94.6 kg)     Height:       Physical Exam  Constitutional: He is oriented to person, place, and time and well-developed, well-nourished, and in no distress.  Cardiovascular: Normal rate, regular rhythm and normal heart sounds.  Pulmonary/Chest: Effort normal. No respiratory distress. He has rales.  Abdominal: Soft. He exhibits distension. There is no tenderness.  Musculoskeletal: He exhibits no edema.  Neurological: He is alert and oriented to person, place, and time.  Left BKA site is intact. Drain tube is in the place Rt toe dry gangrene is with no change. Dorsalis pedis pulse is a week but palpable  Assessment/Plan:  AKI: 2/2 volume loss, medicastion S/p HD x4. Patient has had 1 li urine out put yesterday  Nephrology is on board. Plan to stop HD, supportive care, and observe renal function  -Monitor Renal panel daily -Strict I/O  Uremia/encephalopathy:Resolved.He has been alert and oriented  Acute on chronic anemia s/p pcell.Possibly due to kidney disease. (yesterday CBC held at time of HD) -CBC tomorrow   HTN: BP: 177/79 today -Increased Hydralazin to 20 mg TID today, Has change to 50 by nephro. Continue 50 mg TID -Continue Amlodipin 10 mg QD, Metoprolol 100 mg BID  ZH:YQMVHQM:stable -Continue Sliding scale  Sepsis,Wet Gangrene:Resolved. S/o left BKA 06.28. site of amputation is intact. Drain tube is in place     Dispo: Anticipated discharge in approximately 2-3 days   Joel Hebert, Joel Wince, MD 01/19/2018, 3:54 PM Pager: [phone]

## 2018-01-20 LAB — RENAL FUNCTION PANEL
ALBUMIN: 2.4 g/dL — AB (ref 3.5–5.0)
ANION GAP: 9 (ref 5–15)
BUN: 33 mg/dL — ABNORMAL HIGH (ref 8–23)
CO2: 29 mmol/L (ref 22–32)
Calcium: 8.6 mg/dL — ABNORMAL LOW (ref 8.9–10.3)
Chloride: 102 mmol/L (ref 98–111)
Creatinine, Ser: 4.72 mg/dL — ABNORMAL HIGH (ref 0.61–1.24)
GFR calc non Af Amer: 12 mL/min — ABNORMAL LOW (ref >60)
GFR, EST AFRICAN AMERICAN: 14 mL/min — AB (ref >60)
GLUCOSE: 98 mg/dL (ref 70–99)
PHOSPHORUS: 4.5 mg/dL (ref 2.5–4.6)
POTASSIUM: 3.7 mmol/L (ref 3.5–5.1)
Sodium: 140 mmol/L (ref 135–145)

## 2018-01-20 LAB — CBC
HEMATOCRIT: 24.2 % — AB (ref 39.0–52.0)
HEMOGLOBIN: 7.8 g/dL — AB (ref 13.0–17.0)
MCH: 27.9 pg (ref 26.0–34.0)
MCHC: 32.2 g/dL (ref 30.0–36.0)
MCV: 86.4 fL (ref 78.0–100.0)
Platelets: 274 10*3/uL (ref 150–400)
RBC: 2.8 MIL/uL — AB (ref 4.22–5.81)
RDW: 14.6 % (ref 11.5–15.5)
WBC: 7.2 10*3/uL (ref 4.0–10.5)

## 2018-01-20 LAB — GLUCOSE, CAPILLARY
GLUCOSE-CAPILLARY: 114 mg/dL — AB (ref 70–99)
GLUCOSE-CAPILLARY: 94 mg/dL (ref 70–99)
Glucose-Capillary: 101 mg/dL — ABNORMAL HIGH (ref 70–99)
Glucose-Capillary: 104 mg/dL — ABNORMAL HIGH (ref 70–99)

## 2018-01-20 MED ORDER — FERROUS SULFATE 325 (65 FE) MG PO TABS
325.0000 mg | ORAL_TABLET | Freq: Every day | ORAL | Status: DC
  Administered 2018-01-20 – 2018-01-23 (×4): 325 mg via ORAL
  Filled 2018-01-20 (×4): qty 1

## 2018-01-20 MED ORDER — BISACODYL 5 MG PO TBEC
10.0000 mg | DELAYED_RELEASE_TABLET | Freq: Once | ORAL | Status: AC
  Administered 2018-01-20: 10 mg via ORAL
  Filled 2018-01-20: qty 2

## 2018-01-20 MED ORDER — FUROSEMIDE 10 MG/ML IJ SOLN
60.0000 mg | Freq: Once | INTRAMUSCULAR | Status: AC
  Administered 2018-01-20: 60 mg via INTRAVENOUS
  Filled 2018-01-20: qty 6

## 2018-01-20 NOTE — Progress Notes (Signed)
   Subjective: [name] is doing good. No new symptoms or concern.   Objective:  Vital signs in last 24 hours: Vitals:   01/19/18 1700 01/19/18 2107 01/20/18 0458 01/20/18 0739  BP: (!) 176/88 (!) 176/82 (!) 174/85 (!) 187/86  Pulse: 73 79 77 79  Resp: 18 19 18 18   Temp: 98.5 F (36.9 C) 98.9 F (37.2 C) 98.3 F (36.8 C) 98.4 F (36.9 C)  TempSrc: Oral Oral Oral Oral  SpO2: 95% 97% 93% 94%  Weight:  208 lb 8.1 oz (94.6 kg)    Height:        Physical Exam  Constitutional: He is oriented to person, place, and time and well-developed, well-nourished, and in no distress.  HENT:  Head: Normocephalic and atraumatic.  Neck: No JVD present.  Cardiovascular: Normal rate, regular rhythm and normal heart sounds.  Pulmonary/Chest: Effort normal and breath sounds normal. He has no wheezes. He has no rales.  Abdominal: Soft. Bowel sounds are normal. He exhibits no distension. There is no tenderness.  Neurological: He is alert and oriented to person, place, and time.  Left BKA site is intact. Dry gangrene at rt toe is unchanged. Noticed some mild swelling at right foot. No tenderness.   Assessment/Plan:   AKI: S/p HDx4. Urin output is very good, 1 li yesterday. Today: WUJ81: BUN33, Cr:4.72.  Nephrology is on board and planned to stop HD for now, hope he is in diuretic phase of ATN.  -Monitorrenal functiondaily  -Renal panel daily  Anemia: Hb:7.8 today. Probably due to kidney injury. -Transfuse if Hb<7 -CBC tomorrow  DM: Controled -Continue SSI   HTN: On Amlodipin, Hydralazin, Metoprolol -Nephrology ordered Lasix 60mg  IV x1   Rt foot swelling: Non tender. Non pitting. Distal is warm and sensation and Movement are intact.  -Will monitor daily     Dispo: Anticipated discharge in approximately 2 day(s).   Joel Hebert, Joel Pantoja, MD 01/20/2018, 3:33 PM Pager: [phone]

## 2018-01-20 NOTE — Progress Notes (Signed)
  [facility] Progress Note    Assessment/ Plan:   62y/o man DM PAD s/p TMA 2 weeks ago and then Lt BKA this hospitalization with AKI secondary to NSAID, IV contrast and hypotension started on dialysis.  1 AKI nonoliguric but little solute clearance. Should recover soon. Etio low bp, hemorr, arb , contrast, NSAIDs - s/p HD on 7/12  -will monitor renal function daily as UOP is very good. - plan on holding HD for now; hopefully he's in the diuretic phase of ATN.   2 Anemia got Fe 3 DM controlled 4 PVD postltBKA 5 HTn needs lower vol - Lasix 60mg  IV x1  6 Constipation (has needed an enema previously) - Dulcolax    Subjective:   Feels well; denies f/c/n/v/dyspnea/cp. Good appetite.  States he has great UOP. Held HD sat  No BM in few days        Objective:   BP (!) 174/85 (BP Location: Left Arm)   Pulse 77   Temp 98.3 F (36.8 C) (Oral)   Resp 18   Ht 5\' 8"  (1.727 m)   Wt 94.6 kg (208 lb 8.1 oz)   SpO2 93%   BMI 31.70 kg/m   Intake/Output Summary (Last 24 hours) at 01/20/2018 0733 Last data filed at 01/20/2018 0601 Gross per 24 hour  Intake 1080 ml  Output 1025 ml  Net 55 ml   Weight change: -1.422 kg (-3 lb 2.2 oz)  Physical Exam: General appearance:alert, cooperative, no distress and mildly obese Resp:diminished breath soundsbilaterallyandnoralesat the base Cardio:S1, S2 normal and systolic murmur GI:pos bs , soft Extremities:rt thigh edemaand L BKAw/ wound vacis intact Access: RIJ temp    Imaging: No results found.  Labs: BMET Recent Labs  Lab 01/14/18 0528 01/15/18 0755 01/16/18 [id number] 01/17/18 0555 01/18/18 0438 01/19/18 0354 01/20/18 0446  NA 140 137 136 137 137 138 140  K 4.7 4.5 4.1 4.2 4.2 3.5 3.7  CL 100 98 97* 96* 97* 98 102  CO2 28 28 28 28 27 30 29   GLUCOSE 112* 101* 92 87 90 99 98  BUN 46* 52* 30* 38* 46* 26* 33*  CREATININE 6.28* 6.77* 4.78* 5.75* 6.24* 4.47* 4.72*  CALCIUM 8.5* 8.4*  8.2* 8.6* 8.6* 8.4* 8.6*  PHOS 5.7* 5.8* 4.3 5.1* 5.5* 3.9 4.5   CBC Recent Labs  Lab 01/15/18 0755 01/16/18 1133 01/17/18 0555 01/20/18 0446  WBC 8.6 6.8 7.2 7.2  HGB 8.1* 8.2* 8.2* 7.8*  HCT 25.8* 26.0* 25.6* 24.2*  MCV 86.9 86.4 86.2 86.4  PLT 326 298 333 274    Medications:    . amLODipine  10 mg Oral QHS  . aspirin EC  81 mg Oral BID  . Chlorhexidine Gluconate Cloth  6 each Topical Q0600  . clopidogrel  75 mg Oral Q breakfast  . docusate sodium  100 mg Oral BID  . ferrous sulfate  325 mg Oral Q breakfast  . gabapentin  300 mg Oral QHS  . hydrALAZINE  50 mg Oral Q8H  . insulin aspart  0-5 Units Subcutaneous QHS  . insulin aspart  0-9 Units Subcutaneous TID WC  . metoprolol tartrate  100 mg Oral BID  . multivitamin  1 tablet Oral QHS  . rosuvastatin  20 mg Oral QPM  . vitamin B-12  1,000 mcg Oral Daily      Paulene FloorJames Lin, MD 01/20/2018, 7:33 AM

## 2018-01-21 DIAGNOSIS — M7989 Other specified soft tissue disorders: Secondary | ICD-10-CM

## 2018-01-21 LAB — RENAL FUNCTION PANEL
ANION GAP: 12 (ref 5–15)
Albumin: 2.6 g/dL — ABNORMAL LOW (ref 3.5–5.0)
BUN: 39 mg/dL — ABNORMAL HIGH (ref 8–23)
CHLORIDE: 101 mmol/L (ref 98–111)
CO2: 29 mmol/L (ref 22–32)
CREATININE: 5.33 mg/dL — AB (ref 0.61–1.24)
Calcium: 8.8 mg/dL — ABNORMAL LOW (ref 8.9–10.3)
GFR, EST AFRICAN AMERICAN: 12 mL/min — AB (ref >60)
GFR, EST NON AFRICAN AMERICAN: 10 mL/min — AB (ref >60)
Glucose, Bld: 103 mg/dL — ABNORMAL HIGH (ref 70–99)
Phosphorus: 4.9 mg/dL — ABNORMAL HIGH (ref 2.5–4.6)
Potassium: 3.8 mmol/L (ref 3.5–5.1)
Sodium: 142 mmol/L (ref 135–145)

## 2018-01-21 LAB — CBC
HCT: 26 % — ABNORMAL LOW (ref 39.0–52.0)
HEMOGLOBIN: 8.2 g/dL — AB (ref 13.0–17.0)
MCH: 27.2 pg (ref 26.0–34.0)
MCHC: 31.5 g/dL (ref 30.0–36.0)
MCV: 86.1 fL (ref 78.0–100.0)
PLATELETS: 306 10*3/uL (ref 150–400)
RBC: 3.02 MIL/uL — AB (ref 4.22–5.81)
RDW: 14.7 % (ref 11.5–15.5)
WBC: 11.3 10*3/uL — AB (ref 4.0–10.5)

## 2018-01-21 LAB — GLUCOSE, CAPILLARY
GLUCOSE-CAPILLARY: 134 mg/dL — AB (ref 70–99)
Glucose-Capillary: 94 mg/dL (ref 70–99)
Glucose-Capillary: 104 mg/dL — ABNORMAL HIGH (ref 70–99)
Glucose-Capillary: 118 mg/dL — ABNORMAL HIGH (ref 70–99)

## 2018-01-21 MED ORDER — HYDRALAZINE HCL 50 MG PO TABS
75.0000 mg | ORAL_TABLET | Freq: Three times a day (TID) | ORAL | Status: DC
  Administered 2018-01-21 – 2018-01-23 (×6): 75 mg via ORAL
  Filled 2018-01-21 (×6): qty 1

## 2018-01-21 NOTE — Progress Notes (Addendum)
   Subjective: We visited and evaluated [name] today. He is doing good. No complaint. Denies any symptoms   Objective:  Vital signs in last 24 hours: Vitals:   01/21/18 0931 01/21/18 1748 01/21/18 1750 01/21/18 1801  BP: (!) 172/82 (!) 185/84 (!) 183/83 (!) 167/80  Pulse: 78 79    Resp: 19 18    Temp: 99 F (37.2 C) 98 F (36.7 C)    TempSrc: Oral Oral    SpO2: 91% 92%    Weight:      Height:        Physical Exam  Constitutional: He is oriented to person, place, and time.  Neck: No JVD present.  Cardiovascular: Normal rate and regular rhythm.  No murmur heard. Pulmonary/Chest: Effort normal and breath sounds normal.  Abdominal: Soft. Bowel sounds are normal. He exhibits no distension. There is no tenderness.  Neurological: He is alert and oriented to person, place, and time.  Left BKA site is intact. Dry gangrene at rt toe is unchanged. Noticed some mild swelling at right foot, better compared to yesterday. No tenderness.      Assessment/Plan:  AKI: S/p HDx4. Urin output is very good, 1500 ml yesterday. Today: ZOX09: BUN39, Cr:5.33 Nephrology is on board and planned to stop HD for now, hope he is in diuretic phase of ATN.  -Monitorrenal functiondaily  -Renal panel daily  Anemia: Hb came back to 8.2 today. Probably due to kidney injury. -Transfuse if Hb<7 -CBC monitoring  DM: Controled -Continue SSI   HTN: On Amlodipin, Hydralazin, Metoprolol. Still High BP this AM (176/66) PR:82 Increased Hydralazin to 75 TID today -Continue meds  Rt foot swelling: Improved today in some degree. Non tender. Non pitting. Distal is warm and sensation and Movement are intact.  -Will monitor tomorrow    Dispo: Anticipated discharge in approximately 1-2day(s).   Chevis PrettyMasoudi, Elhamalsadat, MD 01/21/2018, 7:10 PM Pager: [phone]  Internal Medicine Attending  Date: 01/21/2018  Patient name: Joel Hebert Medical record number: [id number] Date of birth:  08/27/1955 Age: 62 y.o. Gender: male  I saw and evaluated the patient. I reviewed the resident's note by Dr. Maryla MorrowMasoudi and I agree with the resident's findings and plans as documented in her progress note.  When seen on rounds this morning [name] was doing well and without complaints. He continues to have a slight increase in his creatinine when not on hemodialysis, but his urine output has been excellent at 1400 mL over the previous 24 hours.  We are continuing to observe his renal function without hemodialysis with the expectation that he is close to improving his BUN and creatinine without hemodialysis.

## 2018-01-21 NOTE — Progress Notes (Signed)
Physical Therapy Treatment Patient Details Name: Joel CurbMelvin Robert J Zacher MRN: [id number] DOB: 10/08/1955 Today's Date: 01/21/2018    History of Present Illness Pt is a 62 y.o. 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 PMH includes initial L 5th toe amp 10/09/17,  DMII, PAD, PVD, MI, HTN.    PT Comments    Continuing work on functional mobility and activity tolerance;  Walked x2 in room today with chair follow for safety; Plan to focus on hip extension therex next session -- perhaps pt can get into prone positioning to help stretch out hip flexors   Follow Up Recommendations  SNF;Supervision for mobility/OOB     Equipment Recommendations  None recommended by PT(TBD at next venue of care)    Recommendations for Other Services       Precautions / Restrictions Precautions Precautions: Fall Precaution Comments: L BKA wound vac Restrictions [name] Weight Bearing: Non weight bearing    Mobility  Bed Mobility Overal bed mobility: Needs Assistance Bed Mobility: Supine to Sit     Supine to sit: HOB elevated;Min guard     General bed mobility comments: patient with increased time and effort with use of bed rails  Transfers Overall transfer level: Needs assistance Equipment used: Rolling walker (2 wheeled) Transfers: Sit to/from Stand Sit to Stand: Mod assist         General transfer comment: Cues for hand placement and safety; light mod assist to power up from low bed  Ambulation/Gait Ambulation/Gait assistance: Min guard;+2 safety/equipment(chair push) Gait Distance (Feet): 15 Feet(x2) Assistive device: Rolling walker (2 wheeled) Gait Pattern/deviations: Step-to pattern     General Gait Details: Cues to incr step length, and step fully into RW   Stairs             Wheelchair Mobility    Modified Rankin (Stroke Patients Only)       Balance      Sitting balance-Leahy Scale: Good       Standing balance-Leahy Scale: Poor                              Cognition Arousal/Alertness: Awake/alert Behavior During Therapy: Flat affect Overall Cognitive Status: Within Functional Limits for tasks assessed(for simple mobility)                                        Exercises      General Comments General comments (skin integrity, edema, etc.): Performed hamstring strecthes with cues for form      Pertinent Vitals/Pain Pain Assessment: Faces Faces Pain Scale: Hurts a little bit Pain Location: L LE around incision site Pain Descriptors / Indicators: Sore Pain Intervention(s): Monitored during session    Home Living                      Prior Function            PT Goals (current goals can now be found in the care plan section) Acute Rehab PT Goals Patient Stated Goal: decrease pain PT Goal Formulation: Patient unable to participate in goal setting Time For Goal Achievement: 02/04/18 Potential to Achieve Goals: Good Progress towards PT goals: Progressing toward goals    Frequency    Min 3X/week  PT Plan Current plan remains appropriate    Co-evaluation              AM-PAC PT "6 Clicks" Daily Activity  Outcome Measure  Difficulty turning over in bed (including adjusting bedclothes, sheets and blankets)?: A Little Difficulty moving from lying on back to sitting on the side of the bed? : A Little Difficulty sitting down on and standing up from a chair with arms (e.g., wheelchair, bedside commode, etc,.)?: Unable Help needed moving to and from a bed to chair (including a wheelchair)?: A Little Help needed walking in hospital room?: A Little Help needed climbing 3-5 steps with a railing? : Total 6 Click Score: 14    End of Session Equipment Utilized During Treatment: Gait belt Activity Tolerance: Patient tolerated treatment well Patient left: in chair;with call  bell/phone within reach;with chair alarm set Nurse Communication: Mobility status PT Visit Diagnosis: Other abnormalities of gait and mobility (R26.89);Difficulty in walking, not elsewhere classified (R26.2) Pain - Right/Left: Left Pain - part of body: Leg     Time: 1423-1450 PT Time Calculation (min) (ACUTE ONLY): 27 min  Charges:  $Gait Training: 8-22 mins $Therapeutic Activity: 8-22 mins                    G Codes:       Van Clines, PT  Acute Rehabilitation Services Pager [phone] Office [phone]    Levi Aland 01/21/2018, 4:54 PM

## 2018-01-21 NOTE — Progress Notes (Signed)
[name] ROUNDING NOTE   Subjective:   Interval History: has complaints today. He was curious as to if his catheter would be internalized prior to discharge but otherwise denied questions or concerns. Slept well overnight and continued to urinate without pain.   Objective:  Vital signs in last 24 hours:  Temp:  [98.6 F (37 C)-99.2 F (37.3 C)] 99.2 F (37.3 C) (07/15 0518) Pulse Rate:  [82-88] 82 (07/15 0518) Resp:  [18] 18 (07/15 0518) BP: (176-184)/(66-85) 176/66 (07/15 0518) SpO2:  [95 %-97 %] 97 % (07/15 0518) Weight:  [208 lb 2.2 oz (94.4 kg)] 208 lb 2.2 oz (94.4 kg) (07/14 2126)  Weight change: -5.9 oz (-0.168 kg) Filed Weights   01/19/18 0126 01/19/18 2107 01/20/18 2126  Weight: 208 lb 8.9 oz (94.6 kg) 208 lb 8.1 oz (94.6 kg) 208 lb 2.2 oz (94.4 kg)    Intake/Output: I/O last 3 completed shifts: In: 1200 [P.O.:1200] Out: 2075 [Urine:2075]   Intake/Output this shift:  Total I/O In: -  Out: 200 [Urine:200]  CVS- RRR RS- CTA ABD- BS present soft non-distended EXT- no edema   Basic Metabolic Panel: Recent Labs  Lab 01/17/18 0555 01/18/18 0438 01/19/18 0354 01/20/18 0446 01/21/18 0416  NA 137 137 138 140 142  K 4.2 4.2 3.5 3.7 3.8  CL 96* 97* 98 102 101  CO2 28 27 30 29 29   GLUCOSE 87 90 99 98 103*  BUN 38* 46* 26* 33* 39*  CREATININE 5.75* 6.24* 4.47* 4.72* 5.33*  CALCIUM 8.6* 8.6* 8.4* 8.6* 8.8*  PHOS 5.1* 5.5* 3.9 4.5 4.9*    Liver Function Tests: Recent Labs  Lab 01/17/18 0555 01/18/18 0438 01/19/18 0354 01/20/18 0446 01/21/18 0416  ALBUMIN 2.3* 2.4* 2.5* 2.4* 2.6*   No results for input(s): LIPASE, AMYLASE in the last 168 hours. No results for input(s): AMMONIA in the last 168 hours.  CBC: Recent Labs  Lab 01/15/18 0755 01/16/18 1133 01/17/18 0555 01/20/18 0446 01/21/18 0416  WBC 8.6 6.8 7.2 7.2 11.3*  HGB 8.1* 8.2* 8.2* 7.8* 8.2*  HCT 25.8* 26.0* 25.6* 24.2* 26.0*  MCV 86.9 86.4 86.2 86.4 86.1  PLT 326 298  333 274 306    Cardiac Enzymes: No results for input(s): CKTOTAL, CKMB, CKMBINDEX, TROPONINI in the last 168 hours.  BNP: Invalid input(s): POCBNP  CBG: Recent Labs  Lab 01/20/18 0738 01/20/18 1144 01/20/18 1646 01/20/18 2122 01/21/18 0822  GLUCAP 101* 114* 104* 94 94    Microbiology: Results for orders placed or performed during the hospital encounter of 01/03/18  Blood Culture (routine x 2)     Status: None   Collection Time: 01/03/18 12:06 PM  Result Value Ref Range Status   Specimen Description BLOOD RIGHT ANTECUBITAL  Final   Special Requests   Final    BOTTLES DRAWN AEROBIC AND ANAEROBIC Blood Culture results may not be optimal due to an excessive volume of blood received in culture bottles   Culture   Final    NO GROWTH 5 DAYS Performed at [facility], [address] [place], [place] [address]    Report Status 01/08/2018 FINAL  Final  Blood Culture (routine x 2)     Status: None   Collection Time: 01/03/18 12:07 PM  Result Value Ref Range Status   Specimen Description BLOOD RIGHT HAND  Final   Special Requests   Final    BOTTLES DRAWN AEROBIC ONLY Blood Culture results may not be optimal due to an excessive volume  of blood received in culture bottles   Culture   Final    NO GROWTH 5 DAYS Performed at [facility], [address] [place], [place] [address]    Report Status 01/08/2018 FINAL  Final  MRSA PCR Screening     Status: None   Collection Time: 01/03/18  5:19 PM  Result Value Ref Range Status   MRSA by PCR NEGATIVE NEGATIVE Final    Comment:        The GeneXpert MRSA Assay (FDA approved for NASAL specimens only), is one component of a comprehensive MRSA colonization surveillance program. It is not intended to diagnose MRSA infection nor to guide or monitor treatment for MRSA infections. Performed at [facility], [address] [place], [place] [address]   Surgical pcr screen     Status: Abnormal   Collection Time:  01/03/18 11:11 PM  Result Value Ref Range Status   MRSA, PCR NEGATIVE NEGATIVE Final   Staphylococcus aureus POSITIVE (A) NEGATIVE Final    Comment: (NOTE) The Xpert SA Assay (FDA approved for NASAL specimens in patients 55 years of age and older), is one component of a comprehensive surveillance program. It is not intended to diagnose infection nor to guide or monitor treatment. Performed at [facility], [address] [place], [place] [address]   Culture, blood (routine x 2)     Status: None   Collection Time: 01/07/18 10:00 PM  Result Value Ref Range Status   Specimen Description BLOOD HEMODIALYSIS CATHETER  Final   Special Requests   Final    BOTTLES DRAWN AEROBIC AND ANAEROBIC Blood Culture adequate volume   Culture   Final    NO GROWTH 5 DAYS Performed at [facility], [address], [place], [place] [address]    Report Status 01/12/2018 FINAL  Final  Culture, blood (routine x 2)     Status: None   Collection Time: 01/07/18 10:10 PM  Result Value Ref Range Status   Specimen Description BLOOD HEMODIALYSIS CATHETER  Final   Special Requests   Final    BOTTLES DRAWN AEROBIC AND ANAEROBIC Blood Culture adequate volume   Culture   Final    NO GROWTH 5 DAYS Performed at [facility], [address], [place], [place] [address]    Report Status 01/12/2018 FINAL  Final    Coagulation Studies: No results for input(s): LABPROT, INR in the last 72 hours.  Urinalysis: No results for input(s): COLORURINE, LABSPEC, PHURINE, GLUCOSEU, HGBUR, BILIRUBINUR, KETONESUR, PROTEINUR, UROBILINOGEN, NITRITE, LEUKOCYTESUR in the last 72 hours.  Invalid input(s): APPERANCEUR    Imaging: No results found.   Medications:   . methocarbamol (ROBAXIN)  IV     . amLODipine  10 mg Oral QHS  . aspirin EC  81 mg Oral BID  . Chlorhexidine Gluconate Cloth  6 each Topical Q0600  . clopidogrel  75 mg Oral Q breakfast  . docusate sodium  100 mg Oral BID  . ferrous sulfate   325 mg Oral Q breakfast  . gabapentin  300 mg Oral QHS  . hydrALAZINE  50 mg Oral Q8H  . insulin aspart  0-5 Units Subcutaneous QHS  . insulin aspart  0-9 Units Subcutaneous TID WC  . metoprolol tartrate  100 mg Oral BID  . multivitamin  1 tablet Oral QHS  . rosuvastatin  20 mg Oral QPM  . vitamin B-12  1,000 mcg Oral Daily   acetaminophen, acetaminophen, bisacodyl, hydrocortisone  cream, HYDROmorphone (DILAUDID) injection, lidocaine (PF), magnesium citrate, methocarbamol **OR** methocarbamol (ROBAXIN)  IV, metoCLOPramide **OR** metoCLOPramide (REGLAN) injection, metoprolol tartrate, ondansetron **OR** ondansetron (ZOFRAN) IV, oxyCODONE, oxyCODONE, polyethylene glycol, senna-docusate  Assessment/ Plan:  Joel Hebert is a 62 yo M who presented with sepsis 2/2 nonhealing ulcers of the lower extremity and found to have a notable acute renal injury thought to be 2/2 to NSAIDS/sepsis. He underwent HD due to anuria but with return of output HD was held.    Hemodialysis-for AKI 2/2 sepsis(toxins)/NSAIDS with ATN. Holding HD while monitoring Renal Function daily as it appears the patient may be in the diuretic phase of ATN given his urinary output. UOP is good. BUN/Cr minimally worse today as compared to prior day, but stable.   ANEMIA-Hgb Stable at 8.2 today, continue PO iron. Transfusion goal >7.0  HTN/VOL-Patient hypertensive again today. Will need lower volume to achieve normotension. Would hold Lasix today given increase in Cr unless he stops diuresing on his own. Continue   ACCESS-Right HD cath clean, clear and free of erythema/edema.  DM controlled on SSI.   LOS: 18 Lanelle Bal, MD Internal Medicine Resident, PGY-2  Renal Atending: Last HD was 7/12.  Increased UOP and slower rise in sCr.  Hopeful for recovery of function.  Lauris Poag, MD

## 2018-01-22 LAB — RENAL FUNCTION PANEL
ALBUMIN: 2.4 g/dL — AB (ref 3.5–5.0)
ANION GAP: 10 (ref 5–15)
BUN: 43 mg/dL — ABNORMAL HIGH (ref 8–23)
CALCIUM: 8.7 mg/dL — AB (ref 8.9–10.3)
CO2: 29 mmol/L (ref 22–32)
Chloride: 102 mmol/L (ref 98–111)
Creatinine, Ser: 5.36 mg/dL — ABNORMAL HIGH (ref 0.61–1.24)
GFR calc non Af Amer: 10 mL/min — ABNORMAL LOW (ref >60)
GFR, EST AFRICAN AMERICAN: 12 mL/min — AB (ref >60)
Glucose, Bld: 108 mg/dL — ABNORMAL HIGH (ref 70–99)
PHOSPHORUS: 4.7 mg/dL — AB (ref 2.5–4.6)
Potassium: 3.4 mmol/L — ABNORMAL LOW (ref 3.5–5.1)
SODIUM: 141 mmol/L (ref 135–145)

## 2018-01-22 LAB — GLUCOSE, CAPILLARY
GLUCOSE-CAPILLARY: 102 mg/dL — AB (ref 70–99)
GLUCOSE-CAPILLARY: 167 mg/dL — AB (ref 70–99)
GLUCOSE-CAPILLARY: 170 mg/dL — AB (ref 70–99)
Glucose-Capillary: 122 mg/dL — ABNORMAL HIGH (ref 70–99)

## 2018-01-22 MED ORDER — SODIUM CHLORIDE 0.9% FLUSH
10.0000 mL | INTRAVENOUS | Status: DC | PRN
  Administered 2018-01-22: 20 mL

## 2018-01-22 MED ORDER — POTASSIUM CHLORIDE CRYS ER 20 MEQ PO TBCR
40.0000 meq | EXTENDED_RELEASE_TABLET | Freq: Once | ORAL | Status: AC
  Administered 2018-01-22: 40 meq via ORAL
  Filled 2018-01-22: qty 2

## 2018-01-22 MED ORDER — NEPRO/CARBSTEADY PO LIQD
237.0000 mL | ORAL | Status: DC
Start: 2018-01-22 — End: 2018-01-24
  Administered 2018-01-22: 237 mL via ORAL
  Filled 2018-01-22 (×3): qty 237

## 2018-01-22 NOTE — Progress Notes (Signed)
Physical Therapy Treatment Patient Details Name: Joel Hebert MRN: [id number] DOB: 09/04/1955 Today's Date: 01/22/2018    History of Present Illness Pt is a 62 y.o. 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 PMH includes initial L 5th toe amp 10/09/17,  DMII, PAD, PVD, MI, HTN.    PT Comments    Continuing work on functional mobility and activity tolerance;  Very good participation in hip strengthening and hamstring stretching therex; Able to incr walking distance with RW, chair pushed behind for safety; quite fatigued at end of walk, his R knee buckled and he sat hard to edge of chair; velocity mitigated by using gait belt, but he was far enough forward that his residual limb did make contact with the floor; Notified RN, who gave pain medication; also noted dr. Ophelia CharterYates came by, changed dressing, and inspected the site  Follow Up Recommendations  SNF;Supervision for mobility/OOB     Equipment Recommendations  Rolling walker with 5" wheels;3in1 (PT);Wheelchair (measurements PT);Wheelchair cushion (measurements PT)    Recommendations for Other Services       Precautions / Restrictions Precautions Precautions: Fall Precaution Comments: L BKA wound vac Restrictions Weight Bearing Restrictions: No [name] Weight Bearing: Partial weight bearing    Mobility  Bed Mobility Overal bed mobility: Needs Assistance Bed Mobility: Supine to Sit     Supine to sit: Min guard     General bed mobility comments: patient with increased time and effort with use of bed rails  Transfers Overall transfer level: Needs assistance Equipment used: Rolling walker (2 wheeled) Transfers: Sit to/from Stand Sit to Stand: Min assist;Mod assist         General transfer comment: Cues for hand placement and safety; initial min assist with heavy dependence on UEs to push up; requiring light mod  assist to power up with more reps  Ambulation/Gait Ambulation/Gait assistance: Min guard;+2 safety/equipment(chair push) Gait Distance (Feet): 40 Feet Assistive device: Rolling walker (2 wheeled) Gait Pattern/deviations: Step-to pattern     General Gait Details: Cues to incr step length, and step fully into RW; able to walk further this session; fatigued towards end of walk, and sat hard to edge of chair   Stairs             Wheelchair Mobility    Modified Rankin (Stroke Patients Only)       Balance     Sitting balance-Leahy Scale: Good       Standing balance-Leahy Scale: Poor                              Cognition Arousal/Alertness: Awake/alert Behavior During Therapy: WFL for tasks assessed/performed Overall Cognitive Status: Within Functional Limits for tasks assessed(for simple mobility)                                        Exercises Amputee Exercises Quad Sets: AROM;Left;10 reps Hip Extension: AROM;Left;10 reps;Standing Other Exercises Other Exercises: seatedhamstring stretch x2 with 20 second hold Other Exercises: Bolstered bridging x20    General Comments        Pertinent Vitals/Pain Pain Assessment: 0-10 Pain Score: 8 ("between a 7 and an 8") Pain Location: L LE around incision site Pain Descriptors / Indicators: Sore Pain Intervention(s): Monitored  during session    Home Living                      Prior Function            PT Goals (current goals can now be found in the care plan section) Acute Rehab PT Goals Patient Stated Goal: decrease pain PT Goal Formulation: With patient Time For Goal Achievement: 02/04/18 Potential to Achieve Goals: Good Progress towards PT goals: Progressing toward goals    Frequency    Min 3X/week      PT Plan Current plan remains appropriate    Co-evaluation              AM-PAC PT "6 Clicks" Daily Activity  Outcome Measure  Difficulty turning  over in bed (including adjusting bedclothes, sheets and blankets)?: None Difficulty moving from lying on back to sitting on the side of the bed? : A Little Difficulty sitting down on and standing up from a chair with arms (e.g., wheelchair, bedside commode, etc,.)?: Unable Help needed moving to and from a bed to chair (including a wheelchair)?: A Little Help needed walking in hospital room?: A Little Help needed climbing 3-5 steps with a railing? : Total 6 Click Score: 15    End of Session Equipment Utilized During Treatment: Gait belt Activity Tolerance: Patient tolerated treatment well Patient left: in chair;with call bell/phone within reach;with chair alarm set Nurse Communication: Mobility status PT Visit Diagnosis: Other abnormalities of gait and mobility (R26.89);Difficulty in walking, not elsewhere classified (R26.2) Pain - Right/Left: Left Pain - part of body: Leg     Time: 4098-1191 PT Time Calculation (min) (ACUTE ONLY): 29 min  Charges:  $Gait Training: 8-22 mins $Therapeutic Exercise: 8-22 mins                    G Codes:       Joel Hebert, PT  Acute Rehabilitation Services Pager [phone] Office [phone]    Joel Hebert 01/22/2018, 12:38 PM

## 2018-01-22 NOTE — Plan of Care (Signed)
  Problem: Health Behavior/Discharge Planning: Goal: Ability to manage health-related needs will improve Outcome: Progressing   

## 2018-01-22 NOTE — Progress Notes (Signed)
   Subjective: [name] was dping good when seen today. No nausea vomiting, sob, chest pain is reported.   Objective:  Vital signs in last 24 hours: Vitals:   01/21/18 2203 01/22/18 0614 01/22/18 1025 01/22/18 1600  BP: (!) 179/85 (!) 168/82 (!) 178/81 (!) 172/76  Pulse: 79 77 89 73  Resp: 14 18 20 18   Temp: 98.9 F (37.2 C) 98.3 F (36.8 C) 98 F (36.7 C) 98 F (36.7 C)  TempSrc: Oral Oral Oral Oral  SpO2: 98% 97% 98% 98%  Weight:  202 lb 6.1 oz (91.8 kg)    Height:       Ph/e: Alert and oriented. In no acute distress CV: RRR Lungs are CTA bilaterally Abdomen is soft. Nl BS, no tenderness Left BKA site is intact and Vac tube at the site No edema   Assessment/Plan: AKI: S/P HDx4. Last HD on 12th BUN and Cr slightly elevated today but not significant. Has good urine output. Nephro on board. Their plan is wait and hope for improvement. -Check BMP daily  BMP Latest Ref Rng & Units 01/22/2018 01/21/2018 01/20/2018  Glucose 70 - 99 mg/dL 562(Z108(H) 308(M103(H) 98  BUN 8 - 23 mg/dL 57(Q43(H) 46(N39(H) 62(X33(H)  Creatinine 0.61 - 1.24 mg/dL 5.28(U5.36(H) 1.32(G5.33(H) 4.01(U4.72(H)  Sodium 135 - 145 mmol/L 141 142 140  Potassium 3.5 - 5.1 mmol/L 3.4(L) 3.8 3.7  Chloride 98 - 111 mmol/L 102 101 102  CO2 22 - 32 mmol/L 29 29 29   Calcium 8.9 - 10.3 mg/dL 2.7(O8.7(L) 5.3(G8.8(L) 6.4(Q8.6(L)    HTN: Still hypertensive. Is on Hydaralzin 75 TID, Amlodipine 10 mg, Metoprolol 100 BID. No ACE Inh, ARB or Lasix due to AKI -Continue Amlodipine, Metoprolol, Hydralazin. Monitor VS  Anemia: Hb stable at 8. Transfusion goal:>7 -Continue PO iron -Monitor H&H  PAD, s/p BKA:  Contacted orthopedic surgeon, will visit him for removing Vac.  Dispo: Anticipated discharge in approximately 1-2day(s).   Joel Hebert, Joel [place], MD 01/22/2018, 5:09 PM Pager: [phone]

## 2018-01-22 NOTE — Progress Notes (Signed)
[facility] ROUNDING NOTE   Subjective:   Interval History: has no complaint today. Slept as "well as could be expected."  Objective:  Vital signs in last 24 hours:  Temp:  [98 F (36.7 C)-99 F (37.2 C)] 98.3 F (36.8 C) (07/16 0614) Pulse Rate:  [77-79] 77 (07/16 0614) Resp:  [14-19] 18 (07/16 0614) BP: (167-185)/(80-85) 168/82 (07/16 0614) SpO2:  [91 %-98 %] 97 % (07/16 0614) Weight:  [202 lb 6.1 oz (91.8 kg)] 202 lb 6.1 oz (91.8 kg) (07/16 4540)  Weight change: -5 lb 12.1 oz (-2.61 kg) Filed Weights   01/19/18 2107 01/20/18 2126 01/22/18 0614  Weight: 208 lb 8.1 oz (94.6 kg) 208 lb 2.2 oz (94.4 kg) 202 lb 6.1 oz (91.8 kg)    Intake/Output: I/O last 3 completed shifts: In: 1076 [P.O.:1076] Out: 1715 [Urine:1715]   Intake/Output this shift:  No intake/output data recorded.  Physical Exam: General: A/O x4, in no acute distress, afebrile, nondiaphoretic CVS- RRR no murmur auscultated  RS- CTA bilaterally  ABD- BS present soft non-distended, nontender EXT- no edema in the RLE, left BKA with wound vac intact, pain improved   Basic Metabolic Panel: Recent Labs  Lab 01/18/18 0438 01/19/18 0354 01/20/18 0446 01/21/18 0416 01/22/18 0428  NA 137 138 140 142 141  K 4.2 3.5 3.7 3.8 3.4*  CL 97* 98 102 101 102  CO2 27 30 29 29 29   GLUCOSE 90 99 98 103* 108*  BUN 46* 26* 33* 39* 43*  CREATININE 6.24* 4.47* 4.72* 5.33* 5.36*  CALCIUM 8.6* 8.4* 8.6* 8.8* 8.7*  PHOS 5.5* 3.9 4.5 4.9* 4.7*    Liver Function Tests: Recent Labs  Lab 01/18/18 0438 01/19/18 0354 01/20/18 0446 01/21/18 0416 01/22/18 0428  ALBUMIN 2.4* 2.5* 2.4* 2.6* 2.4*   No results for input(s): LIPASE, AMYLASE in the last 168 hours. No results for input(s): AMMONIA in the last 168 hours.  CBC: Recent Labs  Lab 01/15/18 0755 01/16/18 1133 01/17/18 0555 01/20/18 0446 01/21/18 0416  WBC 8.6 6.8 7.2 7.2 11.3*  HGB 8.1* 8.2* 8.2* 7.8* 8.2*  HCT 25.8* 26.0* 25.6* 24.2* 26.0*   MCV 86.9 86.4 86.2 86.4 86.1  PLT 326 298 333 274 306    Cardiac Enzymes: No results for input(s): CKTOTAL, CKMB, CKMBINDEX, TROPONINI in the last 168 hours.  BNP: Invalid input(s): POCBNP  CBG: Recent Labs  Lab 01/20/18 2122 01/21/18 0822 01/21/18 1207 01/21/18 1723 01/21/18 2138  GLUCAP 94 94 104* 134* 118*    Microbiology: Results for orders placed or performed during the hospital encounter of 01/03/18  Blood Culture (routine x 2)     Status: None   Collection Time: 01/03/18 12:06 PM  Result Value Ref Range Status   Specimen Description BLOOD RIGHT ANTECUBITAL  Final   Special Requests   Final    BOTTLES DRAWN AEROBIC AND ANAEROBIC Blood Culture results may not be optimal due to an excessive volume of blood received in culture bottles   Culture   Final    NO GROWTH 5 DAYS Performed at [facility], [address], [place], [place] [address]    Report Status 01/08/2018 FINAL  Final  Blood Culture (routine x 2)     Status: None   Collection Time: 01/03/18 12:07 PM  Result Value Ref Range Status   Specimen Description BLOOD RIGHT HAND  Final   Special Requests   Final    BOTTLES DRAWN AEROBIC ONLY Blood Culture results may not be optimal  due to an excessive volume of blood received in culture bottles   Culture   Final    NO GROWTH 5 DAYS Performed at [facility], [address] [place], [place] [address]    Report Status 01/08/2018 FINAL  Final  MRSA PCR Screening     Status: None   Collection Time: 01/03/18  5:19 PM  Result Value Ref Range Status   MRSA by PCR NEGATIVE NEGATIVE Final    Comment:        The GeneXpert MRSA Assay (FDA approved for NASAL specimens only), is one component of a comprehensive MRSA colonization surveillance program. It is not intended to diagnose MRSA infection nor to guide or monitor treatment for MRSA infections. Performed at [facility], [address] [place], [place] [address]   Surgical pcr screen      Status: Abnormal   Collection Time: 01/03/18 11:11 PM  Result Value Ref Range Status   MRSA, PCR NEGATIVE NEGATIVE Final   Staphylococcus aureus POSITIVE (A) NEGATIVE Final    Comment: (NOTE) The Xpert SA Assay (FDA approved for NASAL specimens in patients 63 years of age and older), is one component of a comprehensive surveillance program. It is not intended to diagnose infection nor to guide or monitor treatment. Performed at [facility], [address] [place], [place] [address]   Culture, blood (routine x 2)     Status: None   Collection Time: 01/07/18 10:00 PM  Result Value Ref Range Status   Specimen Description BLOOD HEMODIALYSIS CATHETER  Final   Special Requests   Final    BOTTLES DRAWN AEROBIC AND ANAEROBIC Blood Culture adequate volume   Culture   Final    NO GROWTH 5 DAYS Performed at [facility], [address] [place], [place] [address]    Report Status 01/12/2018 FINAL  Final  Culture, blood (routine x 2)     Status: None   Collection Time: 01/07/18 10:10 PM  Result Value Ref Range Status   Specimen Description BLOOD HEMODIALYSIS CATHETER  Final   Special Requests   Final    BOTTLES DRAWN AEROBIC AND ANAEROBIC Blood Culture adequate volume   Culture   Final    NO GROWTH 5 DAYS Performed at [facility], [address] [place], [place] [address]    Report Status 01/12/2018 FINAL  Final    Coagulation Studies: No results for input(s): LABPROT, INR in the last 72 hours.  Urinalysis: No results for input(s): COLORURINE, LABSPEC, PHURINE, GLUCOSEU, HGBUR, BILIRUBINUR, KETONESUR, PROTEINUR, UROBILINOGEN, NITRITE, LEUKOCYTESUR in the last 72 hours.  Invalid input(s): APPERANCEUR    Imaging: No results found.   Medications:   . methocarbamol (ROBAXIN)  IV     . amLODipine  10 mg Oral QHS  . aspirin EC  81 mg Oral BID  . Chlorhexidine Gluconate Cloth  6 each Topical Q0600  . clopidogrel  75 mg Oral Q breakfast  . docusate sodium   100 mg Oral BID  . ferrous sulfate  325 mg Oral Q breakfast  . gabapentin  300 mg Oral QHS  . hydrALAZINE  75 mg Oral Q8H  . insulin aspart  0-5 Units Subcutaneous QHS  . insulin aspart  0-9 Units Subcutaneous TID WC  . metoprolol tartrate  100 mg Oral BID  . multivitamin  1 tablet Oral QHS  . rosuvastatin  20 mg Oral QPM  . vitamin B-12  1,000 mcg Oral Daily  acetaminophen, acetaminophen, bisacodyl, hydrocortisone cream, HYDROmorphone (DILAUDID) injection, lidocaine (PF), magnesium citrate, methocarbamol **OR** methocarbamol (ROBAXIN)  IV, metoCLOPramide **OR** metoCLOPramide (REGLAN) injection, metoprolol tartrate, ondansetron **OR** ondansetron (ZOFRAN) IV, oxyCODONE, oxyCODONE, polyethylene glycol, senna-docusate, sodium chloride flush  Assessment/ Plan:  Joel Hebert is a 62 yo M who presented with sepsis 2/2 nonhealing ulcers of the lower extremity and found to have a notable acute renal injury thought to be 2/2 to NSAIDS/sepsis. He underwent HD due to anuria but with return of output HD was held.    Hemodialysis-for AKI 2/2 sepsis(toxins)/NSAIDS with ATN. Holding HD while monitoring Renal Function daily as it appears the patient may be in the diuretic phase of ATN given his urinary output. UOP is good. BUN/Cr insignificantly worse today as compared to prior day, but with. Last HD was 7/12. Continue to hope for improvement.   HYPOKALEMIA: K+ 3.4, would monitor   ANEMIA-Hgb Stable at 8.2 7/15, continue PO iron. Transfusion goal >7.0 or ongoing hemorrhaging   HTN/VOL-Patient hypertensive again today  (167-185)/(80-85). Still needs lower volume to achieve normotension. Would hold Lasix today given increase in Cr unless he stops diuresing on his own. Output past 24 hours down from the prior day.   ACCESS-Right HD cath clean, clear and free of erythema/edema.  DM controlled on SSI.   LOS: 19 Lanelle Bal, MD Internal Medicine Resident, PGY-2  Renal Attending: Renal  fct hopefully turning the corner and will cont to follow.  I agree with note above. Casimiro Needle, MD

## 2018-01-22 NOTE — Clinical Social Work Note (Signed)
CSW talked at bedside with patient, his brother Joel Hebert [phone]) and his wife Joel Hebert [phone]) regarding patient, his discharge from hospital, Medicaid, and [place] once he is discharged from [facility]. Patient [place] and discussion ensued regarding what he needs to do once he moves to [place] to live in his father's home. Patient's disability has also been approved and appropriately using the lump sum he will initially receive, staying under Medicaid income limits and applying for Medicaid in [place] discussed at length with patient and family. During room visit, one of the chaplains also visited and explained HCPOA form that will be completed with patient either today or tomorrow.   CSW will continue to monitor patient's progress, provide any additional SW interventions services, and facilitate discharge back to skilled facility once medically stable.  Genelle BalVanessa Gregor Dershem, MSW, LCSW Licensed Clinical Social Worker Clinical Social Work Department [facility] [phone]

## 2018-01-22 NOTE — Progress Notes (Signed)
Patient ID: Joel Hebert, male   DOB: 07/25/1955, 62 y.o.   MRN: [id number] Asked to see pt about VAC from surgery since stayed in hospital and was not seen in followup one week post op. VAC removed, 4X4 and ace wrap placed. followup with Dr. Lajoyce Cornersuda in one week. Incision looks good staples intact.

## 2018-01-22 NOTE — Progress Notes (Signed)
Patient ID: Joel Hebert, male   DOB: 09/15/1955, 62 y.o.   MRN: [id number] Can leave dressing on until he sees Dr. Lajoyce Cornersuda next week . No need for dresssing change.

## 2018-01-22 NOTE — Progress Notes (Signed)
Responded to page assist patient AD.  Patient completed AD and is waiting to get notarized.  Patient had other concerns regarding how to proceed after d/c with nursing home and bank matter.  Social Worker was notified and she came to explain to patient and family on how to move forward.   Provided presence, information sharing between social worker and patient, guidance and  emotional support.  Will follow as needed.    01/22/18 1247  Clinical Encounter Type  Visited With Patient and family together;Health care provider  Visit Type Initial;Spiritual support  Referral From Nurse  Spiritual Encounters  Spiritual Needs Literature;Emotional  Stress Factors  Patient Stress Factors None identified  Family Stress Factors Family relationships;Financial concerns  Merchant navy officerAdvance Directives (For Healthcare)  Does Patient Have a Medical Advance Directive? Yes  Type of Estate agentAdvance Directive Healthcare Power of AvalonAttorney;Living will  Fae [place], Timberly Yott, Chaplain, [facility], Pager [phone]

## 2018-01-22 NOTE — Progress Notes (Signed)
Initial Nutrition Assessment  DOCUMENTATION CODES:   Obesity unspecified  INTERVENTION:   - Nepro Shake po daily, each supplement provides 425 kcal and 19 grams protein  - Encourage PO intake  NUTRITION DIAGNOSIS:   Increased nutrient needs related to post-op healing, wound healing as evidenced by estimated needs.  GOAL:   Patient will meet greater than or equal to 90% of their needs  MONITOR:   PO intake, Supplement acceptance, Weight trends, Skin, I & O's, Labs  REASON FOR ASSESSMENT:   LOS    ASSESSMENT:   62 year old male who presented to the ED on 01/03/18 with complaints of fever. 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 Pt admitted with sepsis.  6/28 - s/p L BKA with Dr. Lajoyce Cornersuda with wound vac placed 7/1 - s/p placement of a non-tunneled HD catheter, first HD session 7/3 - bedside swallow evaluation with recommendation for Dysphagia 3, thin liquids 7/16 - VAC removed  Noted plan is to hold HD at this time and continue to monitor for improved renal function.  Noted pt's weight has fluctuated during admission likely related to fluid status. Pt denies any recent weight loss, stating he does not weigh himself. Pt has not noticed any changes in how his clothing fits.  Pt reports that he has a good appetite and will "eat anything I want." Pt states he got noodles for breakfast this morning. Pt states that he does not like the food here. Pt reports eating well at home. Pt giving short answers to RD questions so unable to obtain more details at this time.  Pt is willing to try Nepro to maximize protein intake during admission.  Meal Completion: 0-75%  Medications reviewed and include: 100 mg Colace BID, 325 mg ferrous sulfate daily, sliding scale Novolog, rena-vit daily, 1000 mcg vitamin B-12 daily  Labs reviewed: potassium 3.4 (L), BUN 43 (H), creaitnine  5.36 (H), phosphorus 4.7 (H) CBG's: 167, 102, 118, 134 x 24 hours  UOP: 1190 ml x 24 hours I/O's: -8.2 L since 01/08/18  NUTRITION - FOCUSED PHYSICAL EXAM:    Most Recent Value  Orbital Region  No depletion  Upper Arm Region  Mild depletion  Thoracic and Lumbar Region  No depletion  Buccal Region  No depletion  Temple Region  Mild depletion  Clavicle Bone Region  No depletion  Clavicle and Acromion Bone Region  No depletion  Scapular Bone Region  Unable to assess  Dorsal Hand  No depletion  Patellar Region  No depletion  Anterior Thigh Region  No depletion  Posterior Calf Region  No depletion  Edema (RD Assessment)  Mild  Hair  Reviewed  Eyes  Reviewed  Mouth  Reviewed  Skin  Reviewed  Nails  Reviewed       Diet Order:   Diet Order           Diet renal/carb modified with fluid restriction Diet-HS Snack? Nothing; Fluid restriction: 1200 mL Fluid; Room service appropriate? Yes; Fluid consistency: Thin  Diet effective now          EDUCATION NEEDS:   No education needs have been identified at this time  Skin:  Skin Assessment: Skin Integrity Issues: Skin Integrity Issues:: Other (Comment), Incisions Incisions: coccyx, L leg Other: necrotic R great toe  Last BM:  01/12/18  Height:   Ht Readings from Last 1 Encounters:  01/06/18 5\' 8"  (1.727 m)  Weight:   Wt Readings from Last 1 Encounters:  01/22/18 202 lb 6.1 oz (91.8 kg)    Ideal Body Weight:  65.45 kg  BMI:  Body mass index is 30.77 kg/m.  Estimated Nutritional Needs:   Kcal:  2200-2400 kcal/day  Protein:  110-125 grams/day  Fluid:  UOP + 500 ml    Earma Reading, MS, RD, LDN Pager: [phone] Weekend/After Hours: [phone]

## 2018-01-23 ENCOUNTER — Ambulatory Visit: Payer: Self-pay | Admitting: Family Medicine

## 2018-01-23 LAB — RENAL FUNCTION PANEL
ALBUMIN: 2.4 g/dL — AB (ref 3.5–5.0)
Anion gap: 9 (ref 5–15)
BUN: 45 mg/dL — AB (ref 8–23)
CHLORIDE: 103 mmol/L (ref 98–111)
CO2: 28 mmol/L (ref 22–32)
Calcium: 8.7 mg/dL — ABNORMAL LOW (ref 8.9–10.3)
Creatinine, Ser: 5.3 mg/dL — ABNORMAL HIGH (ref 0.61–1.24)
GFR calc Af Amer: 12 mL/min — ABNORMAL LOW (ref >60)
GFR calc non Af Amer: 10 mL/min — ABNORMAL LOW (ref >60)
GLUCOSE: 115 mg/dL — AB (ref 70–99)
POTASSIUM: 3.8 mmol/L (ref 3.5–5.1)
Phosphorus: 4.8 mg/dL — ABNORMAL HIGH (ref 2.5–4.6)
Sodium: 140 mmol/L (ref 135–145)

## 2018-01-23 LAB — GLUCOSE, CAPILLARY
GLUCOSE-CAPILLARY: 148 mg/dL — AB (ref 70–99)
Glucose-Capillary: 119 mg/dL — ABNORMAL HIGH (ref 70–99)
Glucose-Capillary: 117 mg/dL — ABNORMAL HIGH (ref 70–99)

## 2018-01-23 LAB — OCCULT BLOOD X 1 CARD TO LAB, STOOL: Fecal Occult Bld: POSITIVE — AB

## 2018-01-23 MED ORDER — RENA-VITE PO TABS: 1.0000 | ORAL_TABLET | Freq: Every day | ORAL | 0 refills | Status: DC

## 2018-01-23 MED ORDER — SENNOSIDES-DOCUSATE SODIUM 8.6-50 MG PO TABS: 1.0000 | ORAL_TABLET | Freq: Every evening | ORAL | Status: DC | PRN

## 2018-01-23 MED ORDER — HYDRALAZINE HCL 50 MG PO TABS
100.0000 mg | ORAL_TABLET | Freq: Three times a day (TID) | ORAL | Status: DC
  Administered 2018-01-23 (×2): 100 mg via ORAL
  Filled 2018-01-23 (×2): qty 2

## 2018-01-23 MED ORDER — HYDROCORTISONE 1 % EX CREA: 1.0000 "application " | TOPICAL_CREAM | Freq: Three times a day (TID) | CUTANEOUS | 0 refills | Status: AC | PRN

## 2018-01-23 MED ORDER — HYDRALAZINE HCL 100 MG PO TABS: 100.0000 mg | ORAL_TABLET | Freq: Three times a day (TID) | ORAL | Status: AC

## 2018-01-23 MED ORDER — AMLODIPINE BESYLATE 10 MG PO TABS: 10.0000 mg | ORAL_TABLET | Freq: Every day | ORAL | Status: AC

## 2018-01-23 MED ORDER — GABAPENTIN 300 MG PO CAPS: 300.0000 mg | ORAL_CAPSULE | Freq: Every day | ORAL | Status: AC

## 2018-01-23 MED ORDER — NEPRO/CARBSTEADY PO LIQD: 237.0000 mL | ORAL | 0 refills | Status: AC

## 2018-01-23 MED ORDER — CYANOCOBALAMIN 1000 MCG PO TABS: 1000.0000 ug | ORAL_TABLET | Freq: Every day | ORAL | Status: AC

## 2018-01-23 MED ORDER — POLYETHYLENE GLYCOL 3350 17 G PO PACK: 17.0000 g | PACK | Freq: Every day | ORAL | 0 refills | Status: AC | PRN

## 2018-01-23 MED ORDER — FERROUS SULFATE 325 (65 FE) MG PO TABS: 325.0000 mg | ORAL_TABLET | Freq: Every day | ORAL | 3 refills | Status: AC

## 2018-01-23 NOTE — Progress Notes (Signed)
[facility] ROUNDING NOTE   Subjective:   Interval History: has no complaint today. He is comfortable and agreeable to SNF placement.  Objective:  Vital signs in last 24 hours:  Temp:  [98 F (36.7 C)-98.9 F (37.2 C)] 98.2 F (36.8 C) (07/17 0753) Pulse Rate:  [73-89] 78 (07/17 0753) Resp:  [18-20] 18 (07/17 0753) BP: (156-178)/(73-82) 174/82 (07/17 0753) SpO2:  [90 %-98 %] 94 % (07/17 0753)  Weight change:  Filed Weights   01/19/18 2107 01/20/18 2126 01/22/18 0614  Weight: 208 lb 8.1 oz (94.6 kg) 208 lb 2.2 oz (94.4 kg) 202 lb 6.1 oz (91.8 kg)    Intake/Output: I/O last 3 completed shifts: In: 1080 [P.O.:1080] Out: 1790 [Urine:1790]   Intake/Output this shift:  No intake/output data recorded.  Physical Exam: General: A/O x 4, in no acute distress, afebrile, nondiaphoretic  CVS- RRR, no murmur rubs or gallops RS- Breath sounds are coarse but not significantly changed from the prior day ABD- BS present soft non-distended EXT- no edema. Left BKA wrapped. Appropriately tender to touch  Basic Metabolic Panel: Recent Labs  Lab 01/19/18 0354 01/20/18 0446 01/21/18 0416 01/22/18 0428 01/23/18 0435  NA 138 140 142 141 140  K 3.5 3.7 3.8 3.4* 3.8  CL 98 102 101 102 103  CO2 30 29 29 29 28   GLUCOSE 99 98 103* 108* 115*  BUN 26* 33* 39* 43* 45*  CREATININE 4.47* 4.72* 5.33* 5.36* 5.30*  CALCIUM 8.4* 8.6* 8.8* 8.7* 8.7*  PHOS 3.9 4.5 4.9* 4.7* 4.8*    Liver Function Tests: Recent Labs  Lab 01/19/18 0354 01/20/18 0446 01/21/18 0416 01/22/18 0428 01/23/18 0435  ALBUMIN 2.5* 2.4* 2.6* 2.4* 2.4*   No results for input(s): LIPASE, AMYLASE in the last 168 hours. No results for input(s): AMMONIA in the last 168 hours.  CBC: Recent Labs  Lab 01/16/18 1133 01/17/18 0555 01/20/18 0446 01/21/18 0416  WBC 6.8 7.2 7.2 11.3*  HGB 8.2* 8.2* 7.8* 8.2*  HCT 26.0* 25.6* 24.2* 26.0*  MCV 86.4 86.2 86.4 86.1  PLT 298 333 274 306    Cardiac  Enzymes: No results for input(s): CKTOTAL, CKMB, CKMBINDEX, TROPONINI in the last 168 hours.  BNP: Invalid input(s): POCBNP  CBG: Recent Labs  Lab 01/22/18 0752 01/22/18 1127 01/22/18 1637 01/22/18 2127 01/23/18 0750  GLUCAP 102* 167* 122* 170* 119*    Microbiology: Results for orders placed or performed during the hospital encounter of 01/03/18  Blood Culture (routine x 2)     Status: None   Collection Time: 01/03/18 12:06 PM  Result Value Ref Range Status   Specimen Description BLOOD RIGHT ANTECUBITAL  Final   Special Requests   Final    BOTTLES DRAWN AEROBIC AND ANAEROBIC Blood Culture results may not be optimal due to an excessive volume of blood received in culture bottles   Culture   Final    NO GROWTH 5 DAYS Performed at [facility], [address], [place], [place] [address]    Report Status 01/08/2018 FINAL  Final  Blood Culture (routine x 2)     Status: None   Collection Time: 01/03/18 12:07 PM  Result Value Ref Range Status   Specimen Description BLOOD RIGHT HAND  Final   Special Requests   Final    BOTTLES DRAWN AEROBIC ONLY Blood Culture results may not be optimal due to an excessive volume of blood received in culture bottles   Culture   Final    NO  GROWTH 5 DAYS Performed at [facility], [address] [place], [place] [address]    Report Status 01/08/2018 FINAL  Final  MRSA PCR Screening     Status: None   Collection Time: 01/03/18  5:19 PM  Result Value Ref Range Status   MRSA by PCR NEGATIVE NEGATIVE Final    Comment:        The GeneXpert MRSA Assay (FDA approved for NASAL specimens only), is one component of a comprehensive MRSA colonization surveillance program. It is not intended to diagnose MRSA infection nor to guide or monitor treatment for MRSA infections. Performed at [facility], [address] [place], [place] [address]   Surgical pcr screen     Status: Abnormal   Collection Time: 01/03/18 11:11 PM  Result  Value Ref Range Status   MRSA, PCR NEGATIVE NEGATIVE Final   Staphylococcus aureus POSITIVE (A) NEGATIVE Final    Comment: (NOTE) The Xpert SA Assay (FDA approved for NASAL specimens in patients 63 years of age and older), is one component of a comprehensive surveillance program. It is not intended to diagnose infection nor to guide or monitor treatment. Performed at [facility], [address] [place], [place] [address]   Culture, blood (routine x 2)     Status: None   Collection Time: 01/07/18 10:00 PM  Result Value Ref Range Status   Specimen Description BLOOD HEMODIALYSIS CATHETER  Final   Special Requests   Final    BOTTLES DRAWN AEROBIC AND ANAEROBIC Blood Culture adequate volume   Culture   Final    NO GROWTH 5 DAYS Performed at [facility], [address] [place], [place] [address]    Report Status 01/12/2018 FINAL  Final  Culture, blood (routine x 2)     Status: None   Collection Time: 01/07/18 10:10 PM  Result Value Ref Range Status   Specimen Description BLOOD HEMODIALYSIS CATHETER  Final   Special Requests   Final    BOTTLES DRAWN AEROBIC AND ANAEROBIC Blood Culture adequate volume   Culture   Final    NO GROWTH 5 DAYS Performed at [facility], [address] [place], [place] [address]    Report Status 01/12/2018 FINAL  Final    Coagulation Studies: No results for input(s): LABPROT, INR in the last 72 hours.  Urinalysis: No results for input(s): COLORURINE, LABSPEC, PHURINE, GLUCOSEU, HGBUR, BILIRUBINUR, KETONESUR, PROTEINUR, UROBILINOGEN, NITRITE, LEUKOCYTESUR in the last 72 hours.  Invalid input(s): APPERANCEUR    Imaging: No results found.   Medications:   . methocarbamol (ROBAXIN)  IV     . amLODipine  10 mg Oral QHS  . aspirin EC  81 mg Oral BID  . Chlorhexidine Gluconate Cloth  6 each Topical Q0600  . clopidogrel  75 mg Oral Q breakfast  . docusate sodium  100 mg Oral BID  . feeding supplement (NEPRO CARB STEADY)  237  mL Oral Q24H  . ferrous sulfate  325 mg Oral Q breakfast  . gabapentin  300 mg Oral QHS  . hydrALAZINE  100 mg Oral Q8H  . insulin aspart  0-5 Units Subcutaneous QHS  . insulin aspart  0-9 Units Subcutaneous TID WC  . metoprolol tartrate  100 mg Oral BID  . multivitamin  1 tablet Oral QHS  . rosuvastatin  20 mg Oral QPM  . vitamin B-12  1,000 mcg Oral Daily   acetaminophen, acetaminophen, bisacodyl, hydrocortisone cream, HYDROmorphone (DILAUDID) injection,  lidocaine (PF), magnesium citrate, methocarbamol **OR** methocarbamol (ROBAXIN)  IV, metoCLOPramide **OR** metoCLOPramide (REGLAN) injection, metoprolol tartrate, ondansetron **OR** ondansetron (ZOFRAN) IV, oxyCODONE, oxyCODONE, polyethylene glycol, senna-docusate, sodium chloride flush  Assessment/ Plan:  Joel Hebert is a 62 yo M who presented with sepsis 2/2 nonhealing ulcers of the lower extremity and found to have a notable acute renal injury thought to be 2/2 to NSAIDS/sepsis. He underwent HD due to anuria but with return of output HD was held.    Hemodialysis-for AKI 2/2 sepsis(toxins)/NSAIDSwith ATN. Holding HD while monitoring Renal Function daily as it appears the patient may be in the diuretic phase of ATN given his urinary output. UOP is good and he is maintaining a net negative balance daily. BUN/Crremained stable. Last HD was 7/12. Continue to hope for improvement.   HYPOKALEMIA resolved, K+ 3.8, continue daily labs  ANEMIA-Hgb Stable at 8.2 7/15, continue PO iron. Transfusion goal >7.0 or ongoing hemorrhaging   HTN/VOL-Patient hypertensive again today as above. . Still needs lower volume to achieve normotension but I would recommend tighter BP control with PO meds prior to discharge to protect what renal function remains.   ACCESS-Right HD cath clean, clear and free of erythema/edema. Okay to remove on discharge  DM controlled on SSI.  Possible discharge to SNF today or tomorrow as per primary team as his renal  function appears to have stabilized for now and potential recovery may be a prolonged process. Would need close outpatient follow-up with BMP's if discharged.    LOS: 20 Joel Bal, MD Internal MedicineResident, PGY-2  Renal Attending: His kidney recovery is stalled.  He remains nonoliguric.  If he goes to a SNF he will need renal fct monitored twice weekly and a renal office visit in about 2 weeks. Joel Needle, MD

## 2018-01-23 NOTE — Progress Notes (Addendum)
   Subjective: [name] was doing good when seen today. We talked to him his Cr has not change in past 2 days. He does not have any nausea, vomiting, dizziness, has had bowel movement x2. Denies any bloody stool or black stool. No other symptoms.  Objective:  Vital signs in last 24 hours: Vitals:   01/22/18 2128 01/23/18 0308 01/23/18 0553 01/23/18 0753  BP: (!) 167/81 (!) 156/73 (!) 166/76 (!) 174/82  Pulse: 83 75 80 78  Resp: 18  18 18   Temp: 98.9 F (37.2 C)   98.2 F (36.8 C)  TempSrc: Oral   Oral  SpO2: 95%  90% 94%  Weight:      Height:       Ph/e: Alert and oriented. In no acute distress Alert and oriented. In no acute distress.  CV: RRR Lungs are CTA bilaterally Abdomen is soft. Nl BS, no tenderness Left BKA site is wrapped after Vac tube removed yesterday  Assessment/Plan:  AKI: 2/2 volume loss, medicastion. Improved. Last HD was on 01/18/2018. Patient has had 1300 ml urine output yesterday.  Nephrology signed off. Plan to discharge and follow up with BMP twice weekly and see nephrologist -BMP twice weekly -F/U with nephrology next week  Uremia/encephalopathy:Resolved. No altered mental status  Acute on chronic anemia s/p transfusion:Possibly due to kidney disease.  CBC Latest Ref Rng & Units 01/21/2018 01/20/2018 01/17/2018  WBC 4.0 - 10.5 K/uL 11.3(H) 7.2 7.2  Hemoglobin 13.0 - 17.0 g/dL 8.2(L) 7.8(L) 8.2(L)  Hematocrit 39.0 - 52.0 % 26.0(L) 24.2(L) 25.6(L)  Platelets 150 - 400 K/uL 306 274 333   FOBT:+ -Scheduled te be seen at GI outpatient [facility](Joel Hebert) -CBC weekly  Wet gangerene of left LE: S/p amputation on 01/04/2018 Transtibial ambustion site is intact. Vac removed yesterday by ortho. -F/u next week with Dr. Lajoyce Hebert  HTN: -Increased Hydralazin to 100 mg TID today -Continue Amlodipin, hydralazin and Metoprolol  ZO:XWRUEAM:stable -Continue home regimn    Dispo: Anticipated discharge in approximately today  Joel PrettyMasoudi, Joel Theys,  MD 01/23/2018, 4:05 PM Pager: [phone]

## 2018-01-23 NOTE — Progress Notes (Signed)
Discharge to [facility] via [facility].

## 2018-01-23 NOTE — Progress Notes (Signed)
Spoke with charge nurse regarding discharge. HDC removed per order. Per RN, CBC order to be discontinued and is not needed. RN aware pt to stay in bed for 30 minutes and bandage to be removed after 24hrs

## 2018-01-23 NOTE — Clinical Social Work Note (Addendum)
[name] is medically stable for discharge and will be returning [facility]. Facility notified and discharge clinicals transmitted to facility. Patient aware of discharge and his brother Joel Hebert contacted and informed [phone]).Patient returning to facility with Joel Hebert and there is a pending Medicaid application. CSW signing off as no other SW intervention services needed.  Genelle BalVanessa Abigaelle Verley, MSW, LCSW Licensed Clinical Social Worker Clinical Social Work Department [facility] [phone]

## 2018-01-23 NOTE — Progress Notes (Signed)
PT Cancellation Note  Patient Details Name: Joel Hebert MRN: [id number] DOB: 05/19/1956   Cancelled Treatment:    Reason Eval/Treat Not Completed: Other (comment). Patient adamantly refusing ambulation or even bed level exercises despite max encouragement. Will follow.  Laurina Bustlearoline Syleena Mchan, PT, DPT Acute Rehabilitation Services  Pager: [phone]    [name] 01/23/2018, 5:01 PM

## 2018-01-24 ENCOUNTER — Telehealth (INDEPENDENT_AMBULATORY_CARE_PROVIDER_SITE_OTHER): Payer: Self-pay | Admitting: Orthopedic Surgery

## 2018-01-24 NOTE — Telephone Encounter (Signed)
Elease [facility]imotoatricia at [facility] called to schedule patients 1 week follow up from discharge on 7/19. I scheduled patient on 7/30. 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 Patricia's # [phone]

## 2018-01-24 NOTE — Telephone Encounter (Signed)
Can you please sch for Tuesday morning the time opened up today for Dr. Lajoyce Cornersuda? Thank you!

## 2018-01-24 NOTE — Telephone Encounter (Signed)
Moved to 7/23

## 2018-01-25 ENCOUNTER — Telehealth (INDEPENDENT_AMBULATORY_CARE_PROVIDER_SITE_OTHER): Payer: Self-pay

## 2018-01-25 NOTE — Telephone Encounter (Signed)
[facility] called wanting to know if patient's dressing needed to be changed before his appointment on Tuesday or if dressing needed to stay on?  Advised healthcare facility that after having surgery, dressing usually stays on until seen by the doctor.  Patient has appointment on 01/29/2018.

## 2018-01-29 ENCOUNTER — Encounter (INDEPENDENT_AMBULATORY_CARE_PROVIDER_SITE_OTHER): Payer: Self-pay | Admitting: Orthopedic Surgery

## 2018-01-29 ENCOUNTER — Ambulatory Visit (INDEPENDENT_AMBULATORY_CARE_PROVIDER_SITE_OTHER): Payer: Self-pay | Admitting: Orthopedic Surgery

## 2018-01-29 VITALS — Ht 68.0 in | Wt 202.0 lb

## 2018-01-29 DIAGNOSIS — T8781 Dehiscence of amputation stump: Secondary | ICD-10-CM

## 2018-01-29 NOTE — Progress Notes (Signed)
Office Visit Note   Patient: Joel Hebert           Date of Birth: 1955-10-26           MRN: [id number] Visit Date: 01/29/2018              Requested by: No referring provider defined for this encounter. PCP: Patient, No Pcp Per  Chief Complaint  Patient presents with  . Left Leg - Follow-up      HPI: Patient is a 62 year old gentleman status post left below the knee amputation.  Patient states that he fell at his facility in the bathroom currently ambulates in a wheelchair.  Assessment & Plan: Visit Diagnoses:  1. Dehiscence of amputation stump (HCC)     Plan: Patient's residual limb is consolidated well patient will continue his current conservative wound care dressing changes he was given a prescription for biotech for a stump shrinker.  Follow-up in the office in 4 weeks.  Follow-Up Instructions: Return in about 1 month (around 02/26/2018).   Ortho Exam  Patient is alert, oriented, no adenopathy, well-dressed, normal affect, normal respiratory effort. Examination patient's residual limb is healing nicely.  There is no redness no cellulitis no drainage no odor no signs of infection.  Imaging: No results found. No images are attached to the encounter.  Labs: Lab Results  Component Value Date   HGBA1C 7.2 (H) 12/14/2017   HGBA1C 7.0 (H) 10/09/2017   REPTSTATUS 01/12/2018 FINAL 01/07/2018   CULT  01/07/2018    NO GROWTH 5 DAYS Performed at [facility], [address] [place], [place] [address]      Lab Results  Component Value Date   ALBUMIN 2.4 (L) 01/23/2018   ALBUMIN 2.4 (L) 01/22/2018   ALBUMIN 2.6 (L) 01/21/2018    Body mass index is 30.71 kg/m.  Orders:  No orders of the defined types were placed in this encounter.  No orders of the defined types were placed in this encounter.    Procedures: No procedures performed  Clinical Data: No additional findings.  ROS:  All other systems negative, except as noted in the  HPI. Review of Systems  Objective: Vital Signs: Ht 5\' 8"  (1.727 m)   Wt 202 lb (91.6 kg)   BMI 30.71 kg/m   Specialty Comments:  No specialty comments available.  PMFS History: Patient Active Problem List   Diagnosis Date Noted  . Anemia   . Uremia due to inadequate renal perfusion   . Acute renal failure (HCC)   . Sepsis (HCC) 01/03/2018  . Dehiscence of amputation stump (HCC) 12/27/2017  . History of transmetatarsal amputation of left foot (HCC) 12/14/2017  . Gangrene of left foot (HCC)   . Atherosclerosis of native artery of left lower extremity with gangrene (HCC) 11/19/2017  . Peripheral artery disease (HCC) 11/08/2017  . PAD (peripheral artery disease) (HCC) 08/14/2017   Past Medical History:  Diagnosis Date  . Coronary artery disease   . High cholesterol   . Hypertension   . MI (myocardial infarction) (HCC) 08/13/2014; 01/2016- 03/2016 X 3  . PAD (peripheral artery disease) (HCC)   . Peripheral vascular disease (HCC)   . Type II diabetes mellitus (HCC)     Family History  Problem Relation Age of Onset  . Heart failure Brother   . Heart disease Brother   . Kidney disease Mother   . Heart disease Father     Past Surgical History:  Procedure Laterality Date  .  ABDOMINAL AORTOGRAM W/LOWER EXTREMITY  08/14/2017  . ABDOMINAL AORTOGRAM W/LOWER EXTREMITY N/A 08/14/2017   Procedure: ABDOMINAL AORTOGRAM W/LOWER EXTREMITY;  Surgeon: Nada LibmanBrabham, Vance W, MD;  Location: [facility];  Service: Cardiovascular;  Laterality: N/A;  . ABDOMINAL AORTOGRAM W/LOWER EXTREMITY N/A 09/05/2017   Procedure: ABDOMINAL AORTOGRAM W/LOWER EXTREMITY;  Surgeon: Maeola Harmanain, Brandon Christopher, MD;  [facility];  Service: Cardiovascular;  Laterality: N/A;  . AMPUTATION Left 10/09/2017   Procedure: AMPUTATION DIGIT LEFT FIFTH TOE;  Surgeon: Maeola Harmanain, Brandon Christopher, MD;  [facility];  Service: Vascular;  Laterality: Left;  . AMPUTATION Left 12/14/2017   Procedure: LEFT  TRANSMETATARSAL AMPUTATION;  Surgeon: Nadara Mustarduda, Lacora Folmer V, MD;  [facility];  Service: Orthopedics;  Laterality: Left;  . AMPUTATION Left 01/04/2018   Procedure: LEFT BELOW KNEE AMPUTATION;  Surgeon: Nadara Mustarduda, Jaydis Duchene V, MD;  [facility];  Service: Orthopedics;  Laterality: Left;  . AMPUTATION TOE Left 10/09/2017   left foot 5th toe  . CORONARY ANGIOPLASTY WITH STENT PLACEMENT  01/2016; 03/2016   "1 + 1" (08/14/2017)  . IR FLUORO GUIDE CV LINE RIGHT  01/07/2018  . IR US GUIDE VASC ACCESS RIGHT  01/07/2018  . LOWER EXTREMITY ANGIOGRAPHY N/A 08/20/2017   Procedure: LOWER EXTREMITY ANGIOGRAPHY-rt leg;  Surgeon: Sherren KernsFields, Charles E, MD;  [facility];  Service: Cardiovascular;  Laterality: N/A;  . PERIPHERAL VASCULAR ATHERECTOMY  08/14/2017   Procedure: PERIPHERAL VASCULAR ATHERECTOMY;  Surgeon: Nada LibmanBrabham, Vance W, MD;  Location: [facility];  Service: Cardiovascular;;  SFA   . PERIPHERAL VASCULAR ATHERECTOMY Left 09/05/2017   Procedure: PERIPHERAL VASCULAR ATHERECTOMY;  Surgeon: Maeola Harmanain, Brandon Christopher, MD;  [facility];  Service: Cardiovascular;  Laterality: Left;  Tibioperoneal and posterior tibial  . PERIPHERAL VASCULAR BALLOON ANGIOPLASTY  08/14/2017   Procedure: PERIPHERAL VASCULAR BALLOON ANGIOPLASTY;  Surgeon: Nada LibmanBrabham, Vance W, MD;  Location: [facility];  Service: Cardiovascular;;  peroneal  . PERIPHERAL VASCULAR BALLOON ANGIOPLASTY  08/20/2017   Procedure: PERIPHERAL VASCULAR BALLOON ANGIOPLASTY;  Surgeon: Sherren KernsFields, Charles E, MD;  Location: [facility];  Service: Cardiovascular;;  . PERIPHERAL VASCULAR INTERVENTION  08/14/2017   Procedure: PERIPHERAL VASCULAR INTERVENTION;  Surgeon: Nada LibmanBrabham, Vance W, MD;  Location: [facility];  Service: Cardiovascular;;  SFA stent  . TONSILLECTOMY    . WOUND DEBRIDEMENT Left 11/06/2017   Procedure: DEBRIDEMENT WOUND TOE BED OF FIFTH TOE LEFT FOOT;  Surgeon: Maeola Harmanain, Brandon Christopher, MD;  [facility];  Service:  Vascular;  Laterality: Left;   Social History   Occupational History  . Not on file  Tobacco Use  . Smoking status: Current Some Day Smoker    Packs/day: 0.25    Years: 43.00    Pack years: 10.75    Types: Cigarettes    Start date: 08/12/1974  . Smokeless tobacco: Never Used  . Tobacco comment: 2-3 cigarettes occasionally  Substance and Sexual Activity  . Alcohol use: Not Currently    Comment: 08/14/2017 "nothing since ~ 2012"  . Drug use: No  . Sexual activity: Not Currently

## 2018-02-01 ENCOUNTER — Observation Stay (HOSPITAL_COMMUNITY)
Admission: EM | Admit: 2018-02-01 | Discharge: 2018-02-02 | Disposition: A | Discharge status: Home / Self Care | Payer: Medicaid Other | Attending: Internal Medicine | Admitting: Internal Medicine

## 2018-02-01 ENCOUNTER — Emergency Department (HOSPITAL_COMMUNITY): Payer: Medicaid Other

## 2018-02-01 ENCOUNTER — Encounter (HOSPITAL_COMMUNITY): Payer: Self-pay

## 2018-02-01 DIAGNOSIS — D649 Anemia, unspecified: Secondary | ICD-10-CM | POA: Diagnosis present

## 2018-02-01 DIAGNOSIS — Z7982 Long term (current) use of aspirin: Secondary | ICD-10-CM | POA: Insufficient documentation

## 2018-02-01 DIAGNOSIS — Z79899 Other long term (current) drug therapy: Secondary | ICD-10-CM | POA: Insufficient documentation

## 2018-02-01 DIAGNOSIS — E1122 Type 2 diabetes mellitus with diabetic chronic kidney disease: Secondary | ICD-10-CM | POA: Diagnosis not present

## 2018-02-01 DIAGNOSIS — Z8249 Family history of ischemic heart disease and other diseases of the circulatory system: Secondary | ICD-10-CM | POA: Diagnosis not present

## 2018-02-01 DIAGNOSIS — Z955 Presence of coronary angioplasty implant and graft: Secondary | ICD-10-CM | POA: Diagnosis not present

## 2018-02-01 DIAGNOSIS — E785 Hyperlipidemia, unspecified: Secondary | ICD-10-CM | POA: Diagnosis not present

## 2018-02-01 DIAGNOSIS — I2510 Atherosclerotic heart disease of native coronary artery without angina pectoris: Secondary | ICD-10-CM | POA: Diagnosis not present

## 2018-02-01 DIAGNOSIS — E1151 Type 2 diabetes mellitus with diabetic peripheral angiopathy without gangrene: Secondary | ICD-10-CM | POA: Diagnosis not present

## 2018-02-01 DIAGNOSIS — Z7902 Long term (current) use of antithrombotics/antiplatelets: Secondary | ICD-10-CM | POA: Insufficient documentation

## 2018-02-01 DIAGNOSIS — E7800 Pure hypercholesterolemia, unspecified: Secondary | ICD-10-CM | POA: Diagnosis not present

## 2018-02-01 DIAGNOSIS — I252 Old myocardial infarction: Secondary | ICD-10-CM | POA: Diagnosis not present

## 2018-02-01 DIAGNOSIS — N179 Acute kidney failure, unspecified: Secondary | ICD-10-CM | POA: Insufficient documentation

## 2018-02-01 DIAGNOSIS — N183 Chronic kidney disease, stage 3 (moderate): Secondary | ICD-10-CM | POA: Diagnosis not present

## 2018-02-01 DIAGNOSIS — R0789 Other chest pain: Principal | ICD-10-CM | POA: Insufficient documentation

## 2018-02-01 DIAGNOSIS — Z992 Dependence on renal dialysis: Secondary | ICD-10-CM | POA: Diagnosis not present

## 2018-02-01 DIAGNOSIS — F17210 Nicotine dependence, cigarettes, uncomplicated: Secondary | ICD-10-CM | POA: Diagnosis not present

## 2018-02-01 DIAGNOSIS — Z794 Long term (current) use of insulin: Secondary | ICD-10-CM | POA: Diagnosis not present

## 2018-02-01 DIAGNOSIS — I1310 Hypertensive heart and chronic kidney disease without heart failure, with stage 1 through stage 4 chronic kidney disease, or unspecified chronic kidney disease: Secondary | ICD-10-CM | POA: Insufficient documentation

## 2018-02-01 DIAGNOSIS — R079 Chest pain, unspecified: Secondary | ICD-10-CM | POA: Diagnosis present

## 2018-02-01 DIAGNOSIS — Z89512 Acquired absence of left leg below knee: Secondary | ICD-10-CM | POA: Diagnosis not present

## 2018-02-01 LAB — I-STAT CHEM 8, ED
BUN: 30 mg/dL — ABNORMAL HIGH (ref 8–23)
CHLORIDE: 104 mmol/L (ref 98–111)
CREATININE: 3.8 mg/dL — AB (ref 0.61–1.24)
Calcium, Ion: 1.14 mmol/L — ABNORMAL LOW (ref 1.15–1.40)
Glucose, Bld: 141 mg/dL — ABNORMAL HIGH (ref 70–99)
HEMATOCRIT: 23 % — AB (ref 39.0–52.0)
Hemoglobin: 7.8 g/dL — ABNORMAL LOW (ref 13.0–17.0)
POTASSIUM: 3.4 mmol/L — AB (ref 3.5–5.1)
Sodium: 143 mmol/L (ref 135–145)
TCO2: 27 mmol/L (ref 22–32)

## 2018-02-01 LAB — CBC WITH DIFFERENTIAL/PLATELET
ABS IMMATURE GRANULOCYTES: 0 10*3/uL (ref 0.0–0.1)
BASOS ABS: 0.1 10*3/uL (ref 0.0–0.1)
Basophils Relative: 1 %
EOS PCT: 9 %
Eosinophils Absolute: 0.9 10*3/uL — ABNORMAL HIGH (ref 0.0–0.7)
HEMATOCRIT: 23.9 % — AB (ref 39.0–52.0)
Hemoglobin: 7.2 g/dL — ABNORMAL LOW (ref 13.0–17.0)
IMMATURE GRANULOCYTES: 0 %
LYMPHS PCT: 23 %
Lymphs Abs: 2.3 10*3/uL (ref 0.7–4.0)
MCH: 26.6 pg (ref 26.0–34.0)
MCHC: 30.1 g/dL (ref 30.0–36.0)
MCV: 88.2 fL (ref 78.0–100.0)
Monocytes Absolute: 0.5 10*3/uL (ref 0.1–1.0)
Monocytes Relative: 5 %
NEUTROS PCT: 62 %
Neutro Abs: 6.4 10*3/uL (ref 1.7–7.7)
Platelets: 365 10*3/uL (ref 150–400)
RBC: 2.71 MIL/uL — AB (ref 4.22–5.81)
RDW: 16 % — ABNORMAL HIGH (ref 11.5–15.5)
WBC: 10.4 10*3/uL (ref 4.0–10.5)

## 2018-02-01 LAB — I-STAT TROPONIN, ED: TROPONIN I, POC: 0 ng/mL (ref 0.00–0.08)

## 2018-02-01 MED ORDER — FENTANYL CITRATE (PF) 100 MCG/2ML IJ SOLN
50.0000 ug | Freq: Once | INTRAMUSCULAR | Status: AC
  Administered 2018-02-02: 50 ug via INTRAVENOUS
  Filled 2018-02-01: qty 2

## 2018-02-01 NOTE — ED Provider Notes (Addendum)
[facility] EMERGENCY DEPARTMENT Provider Note   CSN: [id number] Arrival date & time: 02/01/18  2238     History   Chief Complaint Chief Complaint  Patient presents with  . Chest Pain    HPI Joel Hebert is a 62 y.o. male.  The history is provided by the patient. No language interpreter was used.  Chest Pain   This is a new problem. The current episode started 1 to 2 hours ago. The problem occurs constantly. The problem has not changed since onset.The pain is associated with rest. The pain is present in the substernal region. The pain is at a severity of 7/10. The pain is severe. The quality of the pain is described as dull. The pain does not radiate. Exacerbated by: none. Pertinent negatives include no abdominal pain, no back pain, no claudication, no cough, no diaphoresis, no dizziness, no exertional chest pressure, no fever, no hemoptysis, no irregular heartbeat, no lower extremity edema, no near-syncope, no palpitations and no shortness of breath. He has tried nothing for the symptoms. The treatment provided no relief. Risk factors include being elderly and alcohol intake.  Pertinent negatives for past medical history include Turner syndrome.  Pertinent negatives for family medical history include: no Marfan's syndrome.  Procedure history is negative for EPS study.  Awoke patient from sleep.  SSCP without radiation.  Mild SOB, no n/v/d.  No f/c/r.  Did not respond to nitro SL for EMS.    Past Medical History:  Diagnosis Date  . Coronary artery disease   . High cholesterol   . Hypertension   . MI (myocardial infarction) ([place]) 08/13/2014; 01/2016- 03/2016 X 3  . PAD (peripheral artery disease) ([place])   . Peripheral vascular disease ([place])   . Type II diabetes mellitus [facility])     Patient Active Problem List   Diagnosis Date Noted  . Anemia   . Uremia due to inadequate renal perfusion   . Acute renal failure ([place])   . Sepsis ([place]) 01/03/2018  .  Dehiscence of amputation stump ([place]) 12/27/2017  . History of transmetatarsal amputation of left foot ([place]) 12/14/2017  . Gangrene of left foot ([place])   . Atherosclerosis of native artery of left lower extremity with gangrene ([place]) 11/19/2017  . Peripheral artery disease ([place]) 11/08/2017  . PAD (peripheral artery disease) ([place]) 08/14/2017    Past Surgical History:  Procedure Laterality Date  . ABDOMINAL AORTOGRAM W/LOWER EXTREMITY  08/14/2017  . ABDOMINAL AORTOGRAM W/LOWER EXTREMITY N/A 08/14/2017   Procedure: ABDOMINAL AORTOGRAM W/LOWER EXTREMITY;  Surgeon: Serafina Mitchell, MD;  Location: [facility];  Service: Cardiovascular;  Laterality: N/A;  . ABDOMINAL AORTOGRAM W/LOWER EXTREMITY N/A 09/05/2017   Procedure: ABDOMINAL AORTOGRAM W/LOWER EXTREMITY;  Surgeon: Waynetta Sandy, MD;  Location: [place];  Service: Cardiovascular;  Laterality: N/A;  . AMPUTATION Left 10/09/2017   Procedure: AMPUTATION DIGIT LEFT FIFTH TOE;  Surgeon: Waynetta Sandy, MD;  Location: [facility];  Service: Vascular;  Laterality: Left;  . AMPUTATION Left 12/14/2017   Procedure: LEFT TRANSMETATARSAL AMPUTATION;  Surgeon: Newt Minion, MD;  Location: [facility];  Service: Orthopedics;  Laterality: Left;  . AMPUTATION Left 01/04/2018   Procedure: LEFT BELOW KNEE AMPUTATION;  Surgeon: Newt Minion, MD;  Location: [facility];  Service: Orthopedics;  Laterality: Left;  . AMPUTATION TOE Left 10/09/2017   left foot 5th toe  . CORONARY ANGIOPLASTY WITH STENT PLACEMENT  01/2016; 03/2016   "1 + 1" (08/14/2017)  .  IR FLUORO GUIDE CV LINE RIGHT  01/07/2018  . IR US GUIDE VASC ACCESS RIGHT  01/07/2018  . LOWER EXTREMITY ANGIOGRAPHY N/A 08/20/2017   Procedure: LOWER EXTREMITY ANGIOGRAPHY-rt leg;  Surgeon: Elam Dutch, MD;  Location: [facility];  Service: Cardiovascular;  Laterality: N/A;  . PERIPHERAL VASCULAR ATHERECTOMY  08/14/2017   Procedure: PERIPHERAL VASCULAR ATHERECTOMY;  Surgeon: Serafina Mitchell, MD;  Location: [facility];  Service: Cardiovascular;;  SFA   . PERIPHERAL VASCULAR ATHERECTOMY Left 09/05/2017   Procedure: PERIPHERAL VASCULAR ATHERECTOMY;  Surgeon: Waynetta Sandy, MD;  Location: [facility];  Service: Cardiovascular;  Laterality: Left;  Tibioperoneal and posterior tibial  . PERIPHERAL VASCULAR BALLOON ANGIOPLASTY  08/14/2017   Procedure: PERIPHERAL VASCULAR BALLOON ANGIOPLASTY;  Surgeon: Serafina Mitchell, MD;  Location: [facility];  Service: Cardiovascular;;  peroneal  . PERIPHERAL VASCULAR BALLOON ANGIOPLASTY  08/20/2017   Procedure: PERIPHERAL VASCULAR BALLOON ANGIOPLASTY;  Surgeon: Elam Dutch, MD;  Location: [facility];  Service: Cardiovascular;;  . PERIPHERAL VASCULAR INTERVENTION  08/14/2017   Procedure: PERIPHERAL VASCULAR INTERVENTION;  Surgeon: Serafina Mitchell, MD;  Location: [facility];  Service: Cardiovascular;;  SFA stent  . TONSILLECTOMY    . WOUND DEBRIDEMENT Left 11/06/2017   Procedure: DEBRIDEMENT WOUND TOE BED OF FIFTH TOE LEFT FOOT;  Surgeon: Waynetta Sandy, MD;  Location: [facility];  Service: Vascular;  Laterality: Left;        Home Medications    Prior to Admission medications   Medication Sig Start Date End Date Taking? Authorizing Provider  acetaminophen (TYLENOL) 500 MG tablet Take 500 mg by mouth every 6 (six) hours as needed for mild pain or headache.     [provider]  amLODipine (NORVASC) 10 MG tablet Take 1 tablet (10 mg total) by mouth at bedtime. 01/23/18   Lorella Nimrod, MD  aspirin EC 81 MG tablet Take 81 mg by mouth 2 (two) times daily.     [provider]  clopidogrel (PLAVIX) 75 MG tablet Take 1 tablet (75 mg total) by mouth daily with breakfast. 08/15/17   Rhyne, Hulen Shouts, PA-C  ferrous sulfate 325 (65 FE) MG tablet Take 1 tablet (325 mg total) by mouth daily with breakfast. 01/24/18   Lorella Nimrod, MD  gabapentin (NEURONTIN) 300 MG capsule Take 1 capsule  (300 mg total) by mouth at bedtime. 01/23/18   Lorella Nimrod, MD  hydrALAZINE (APRESOLINE) 100 MG tablet Take 1 tablet (100 mg total) by mouth every 8 (eight) hours. 01/23/18   Lorella Nimrod, MD  hydrocortisone cream 1 % Apply 1 application topically 3 (three) times daily as needed for itching (minor skin irritation). 01/23/18   Lorella Nimrod, MD  insulin aspart (NOVOLOG) 100 UNIT/ML injection Inject 0-15 Units into the skin 3 (three) times daily with meals. 12/18/17   Newt Minion, MD  metFORMIN (GLUCOPHAGE) 850 MG tablet Take 850 mg by mouth 2 (two) times daily with a meal.    [provider]  methocarbamol (ROBAXIN) 500 MG tablet Take 1 tablet (500 mg total) by mouth every 8 (eight) hours as needed for muscle spasms. Patient not taking: Reported on 01/03/2018 12/18/17   Newt Minion, MD  metoprolol tartrate (LOPRESSOR) 100 MG tablet Take 100 mg by mouth 2 (two) times daily.    [provider]  multivitamin (RENA-VIT) TABS tablet Take 1 tablet by mouth at bedtime. 01/23/18   Lorella Nimrod, MD  nitroGLYCERIN (  NITROSTAT) 0.4 MG SL tablet Place 0.4 mg under the tongue every 5 (five) minutes as needed for chest pain.    [provider]  Nutritional Supplements (FEEDING SUPPLEMENT, NEPRO CARB STEADY,) LIQD Take 237 mLs by mouth daily. 01/23/18   Lorella Nimrod, MD  oxyCODONE-acetaminophen (PERCOCET) 10-325 MG tablet Take 1 tablet by mouth every 4 (four) hours as needed for pain. 12/18/17   Newt Minion, MD  polyethylene glycol [facility] / Floria Raveling) packet Take 17 g by mouth daily as needed for mild constipation. 01/23/18   Lorella Nimrod, MD  rosuvastatin (CRESTOR) 20 MG tablet Take 20 mg by mouth every evening.     [provider]  senna-docusate (SENOKOT-S) 8.6-50 MG tablet Take 1 tablet by mouth at bedtime as needed for mild constipation. 01/23/18   Lorella Nimrod, MD  vitamin B-12 1000 MCG tablet Take 1 tablet (1,000 mcg total) by mouth daily. 01/24/18   Lorella Nimrod, MD     Family History Family History  Problem Relation Age of Onset  . Heart failure Brother   . Heart disease Brother   . Kidney disease Mother   . Heart disease Father     Social History Social History   Tobacco Use  . Smoking status: Current Some Day Smoker    Packs/day: 0.25    Years: 43.00    Pack years: 10.75    Types: Cigarettes    Start date: 08/12/1974  . Smokeless tobacco: Never Used  . Tobacco comment: 2-3 cigarettes occasionally  Substance Use Topics  . Alcohol use: Not Currently    Comment: 08/14/2017 "nothing since ~ 2012"  . Drug use: No     Allergies   Patient has no known allergies.   Review of Systems Review of Systems  Constitutional: Negative for diaphoresis and fever.  HENT: Negative for congestion and sore throat.   Respiratory: Negative for cough, hemoptysis and shortness of breath.   Cardiovascular: Positive for chest pain. Negative for palpitations, claudication, leg swelling and near-syncope.  Gastrointestinal: Negative for abdominal pain.  Musculoskeletal: Negative for back pain.  Neurological: Negative for dizziness.  All other systems reviewed and are negative.    Physical Exam Updated Vital Signs BP (!) 171/82 (BP Location: Right Arm)   Pulse 98   Temp 98.6 F (37 C) (Oral)   Resp 18   Ht '5\' 8"'$  (1.727 m)   Wt 91.6 kg (202 lb)   SpO2 98%   BMI 30.71 kg/m   Physical Exam  Constitutional: He is oriented to person, place, and time. He appears well-developed and well-nourished.  HENT:  Head: Normocephalic and atraumatic.  Mouth/Throat: No oropharyngeal exudate.  Eyes: Pupils are equal, round, and reactive to light. Conjunctivae are normal.  Neck: Normal range of motion. Neck supple. No JVD present.  Cardiovascular: Normal rate, regular rhythm, normal heart sounds and intact distal pulses.  Pulmonary/Chest: Effort normal and breath sounds normal. No stridor. He has no wheezes. He has no rales.  Abdominal: Soft. Bowel sounds are  normal. He exhibits no mass. There is no tenderness. There is no rebound and no guarding.  Musculoskeletal: Normal range of motion.  [name] BKA site without erythema warmth or drainage.    Neurological: He is alert and oriented to person, place, and time. He displays normal reflexes.  Skin: Skin is warm. Capillary refill takes less than 2 seconds. He is not diaphoretic.  Psychiatric: He has a normal mood and affect.     ED Treatments / Results  Labs (  all labs ordered are listed, but only abnormal results are displayed) Results for orders placed or performed during the hospital encounter of 02/01/18  CBC with Differential/Platelet  Result Value Ref Range   WBC 10.4 4.0 - 10.5 K/uL   RBC 2.71 (L) 4.22 - 5.81 MIL/uL   Hemoglobin 7.2 (L) 13.0 - 17.0 g/dL   HCT 23.9 (L) 39.0 - 52.0 %   MCV 88.2 78.0 - 100.0 fL   MCH 26.6 26.0 - 34.0 pg   MCHC 30.1 30.0 - 36.0 g/dL   RDW 16.0 (H) 11.5 - 15.5 %   Platelets 365 150 - 400 K/uL   Neutrophils Relative % 62 %   Neutro Abs 6.4 1.7 - 7.7 K/uL   Lymphocytes Relative 23 %   Lymphs Abs 2.3 0.7 - 4.0 K/uL   Monocytes Relative 5 %   Monocytes Absolute 0.5 0.1 - 1.0 K/uL   Eosinophils Relative 9 %   Eosinophils Absolute 0.9 (H) 0.0 - 0.7 K/uL   Basophils Relative 1 %   Basophils Absolute 0.1 0.0 - 0.1 K/uL   Immature Granulocytes 0 %   Abs Immature Granulocytes 0.0 0.0 - 0.1 K/uL  I-Stat Chem 8, ED  Result Value Ref Range   Sodium 143 135 - 145 mmol/L   Potassium 3.4 (L) 3.5 - 5.1 mmol/L   Chloride 104 98 - 111 mmol/L   BUN 30 (H) 8 - 23 mg/dL   Creatinine, Ser 3.80 (H) 0.61 - 1.24 mg/dL   Glucose, Bld 141 (H) 70 - 99 mg/dL   Calcium, Ion 1.14 (L) 1.15 - 1.40 mmol/L   TCO2 27 22 - 32 mmol/L   Hemoglobin 7.8 (L) 13.0 - 17.0 g/dL   HCT 23.0 (L) 39.0 - 52.0 %  I-stat troponin, ED  Result Value Ref Range   Troponin i, poc 0.00 0.00 - 0.08 ng/mL   Comment 3           Dg Chest 2 View  Result Date: 02/01/2018 CLINICAL DATA:  Chest pain.  EXAM: CHEST - 2 VIEW COMPARISON:  Radiograph of January 07, 2018. FINDINGS: Stable cardiomediastinal silhouette is noted with central pulmonary vascular congestion. Atherosclerosis of thoracic aorta is noted. Probable mild bilateral basilar edema is noted. No pneumothorax is noted. Minimal bilateral pleural effusions may be present. Bony thorax is unremarkable. IMPRESSION: Central pulmonary vascular congestion is noted with probable mild bilateral basilar edema. Minimal pleural effusions may be present. Aortic Atherosclerosis (ICD10-I70.0). Electronically Signed   By: Marijo Conception, M.D.   On: 02/01/2018 23:42   Dg Chest 2 View  Result Date: 01/03/2018 CLINICAL DATA:  Shortness of breath. EXAM: CHEST - 2 VIEW COMPARISON:  No prior. FINDINGS: Mediastinum and hilar structures normal. Nodular opacity noted over the left upper lung. Repeat PA and lateral chest x-ray suggested. If nodular density remains chest CT can be obtained for further evaluation. Low lung volumes with mild basilar atelectasis. Cardiomegaly with normal pulmonary vascularity. No acute bony abnormality. IMPRESSION: 1. Questionable small nodular density left upper lung. Repeat PA lateral chest x-ray suggested. Nodular density remains chest CT can be obtained to further evaluate. 2.  Mild bibasilar subsegmental atelectasis. 3.  Cardiomegaly.  No pulmonary venous congestion. Electronically Signed   By: Marcello Moores  Register   On: 01/03/2018 11:41   US Renal  Result Date: 01/03/2018 CLINICAL DATA:  Acute renal failure 1 day. EXAM: RENAL / URINARY TRACT ULTRASOUND COMPLETE COMPARISON:  None. FINDINGS: Right Kidney: Length: 12.3 cm. Echogenicity within normal limits. No  mass or hydronephrosis visualized. Left Kidney: Length: 12.1 cm. Echogenicity within normal limits. No mass or hydronephrosis visualized. Bladder: Appears normal for degree of bladder distention. IMPRESSION: Normal renal ultrasound. Electronically Signed   By: Marin Olp M.D.   On:  01/03/2018 21:42   Ir Fluoro Guide Cv Line Right  Result Date: 01/07/2018 INDICATION: End-stage renal disease, in need of placement of a temporary dialysis catheter for the urgent initiation of dialysis. EXAM: NON-TUNNELED CENTRAL VENOUS HEMODIALYSIS CATHETER PLACEMENT WITH ULTRASOUND AND FLUOROSCOPIC GUIDANCE COMPARISON:  None. MEDICATIONS: None FLUOROSCOPY TIME:  18 seconds (3  mGy) COMPLICATIONS: None immediate. PROCEDURE: Informed written consent was obtained from the patient after a discussion of the risks, benefits, and alternatives to treatment. Questions regarding the procedure were encouraged and answered. The right neck and chest were prepped with chlorhexidine in a sterile fashion, and a sterile drape was applied covering the operative field. Maximum barrier sterile technique with sterile gowns and gloves were used for the procedure. A timeout was performed prior to the initiation of the procedure. After the overlying soft tissues were anesthetized, a small venotomy incision was created and a micropuncture kit was utilized to access the internal jugular vein. Real-time ultrasound guidance was utilized for vascular access including the acquisition of a permanent ultrasound image documenting patency of the accessed vessel. The microwire was utilized to measure appropriate catheter length. A stiff glidewire was advanced to the level of the IVC. Under fluoroscopic guidance, the venotomy was serially dilated, ultimately allowing placement of a 20 cm temporary Trialysis catheter with tip ultimately terminating within the superior aspect of the right atrium. Final catheter positioning was confirmed and documented with a spot radiographic image. The catheter aspirates and flushes normally. The catheter was flushed with appropriate volume heparin dwells. The catheter exit site was secured with a 0-Prolene retention suture. A dressing was placed. The patient tolerated the procedure well without immediate post  procedural complication. IMPRESSION: Successful placement of a right internal jugular approach 20 cm temporary dialysis catheter with tip terminating with in the superior aspect of the right atrium. The catheter is ready for immediate use. PLAN: This catheter may be converted to a tunneled dialysis catheter at a later date as indicated. Electronically Signed   By: Sandi Mariscal M.D.   On: 01/07/2018 13:06   Ir US Guide Vasc Access Right  Result Date: 01/07/2018 INDICATION: End-stage renal disease, in need of placement of a temporary dialysis catheter for the urgent initiation of dialysis. EXAM: NON-TUNNELED CENTRAL VENOUS HEMODIALYSIS CATHETER PLACEMENT WITH ULTRASOUND AND FLUOROSCOPIC GUIDANCE COMPARISON:  None. MEDICATIONS: None FLUOROSCOPY TIME:  18 seconds (3  mGy) COMPLICATIONS: None immediate. PROCEDURE: Informed written consent was obtained from the patient after a discussion of the risks, benefits, and alternatives to treatment. Questions regarding the procedure were encouraged and answered. The right neck and chest were prepped with chlorhexidine in a sterile fashion, and a sterile drape was applied covering the operative field. Maximum barrier sterile technique with sterile gowns and gloves were used for the procedure. A timeout was performed prior to the initiation of the procedure. After the overlying soft tissues were anesthetized, a small venotomy incision was created and a micropuncture kit was utilized to access the internal jugular vein. Real-time ultrasound guidance was utilized for vascular access including the acquisition of a permanent ultrasound image documenting patency of the accessed vessel. The microwire was utilized to measure appropriate catheter length. A stiff glidewire was advanced to the level of the IVC. Under fluoroscopic guidance,  the venotomy was serially dilated, ultimately allowing placement of a 20 cm temporary Trialysis catheter with tip ultimately terminating within the  superior aspect of the right atrium. Final catheter positioning was confirmed and documented with a spot radiographic image. The catheter aspirates and flushes normally. The catheter was flushed with appropriate volume heparin dwells. The catheter exit site was secured with a 0-Prolene retention suture. A dressing was placed. The patient tolerated the procedure well without immediate post procedural complication. IMPRESSION: Successful placement of a right internal jugular approach 20 cm temporary dialysis catheter with tip terminating with in the superior aspect of the right atrium. The catheter is ready for immediate use. PLAN: This catheter may be converted to a tunneled dialysis catheter at a later date as indicated. Electronically Signed   By: Sandi Mariscal M.D.   On: 01/07/2018 13:06   Dg Chest Port 1 View  Result Date: 01/07/2018 CLINICAL DATA:  Fever EXAM: PORTABLE CHEST 1 VIEW COMPARISON:  01/03/2018 FINDINGS: Development of small pleural effusions. Cardiomegaly with vascular congestion and diffuse interstitial edema, increased compared to prior. Focal airspace disease at the bases. Aortic atherosclerosis. No pneumothorax. Right IJ central venous catheter tip overlies the right atrium. IMPRESSION: 1. Cardiomegaly with vascular congestion and development of moderate pulmonary edema and small pleural effusion. 2. Bibasilar airspace disease, left greater than right, atelectasis versus pneumonia 3. New right IJ central venous catheter with tip projecting over the right atrium Electronically Signed   By: Donavan Foil M.D.   On: 01/07/2018 21:14    EKG  Date: 02/01/2018  Rate: 98  Rhythm: normal sinus rhythm  QRS Axis: normal  Intervals: normal  ST/T Wave abnormalities: normal  Conduction Disutrbances: none  Narrative Interpretation: anterior infarct, age undetermined       Procedures Procedures (including critical care time)  Medications Ordered in ED Medications - No data to  display     Final Clinical Impressions(s) / ED Diagnoses   HEART score is 7 will need inpatient rule out   Julieanna Geraci, MD 02/02/18 0201    Aliyha Fornes, MD 02/02/18 [id number]

## 2018-02-01 NOTE — ED Triage Notes (Signed)
Pt arrived via [facility] from [facility] on [address]  EMS gave 4 SL Nitro and 324mg  ASA.  Pt states his aching central chest pain started tonight and is a 7/10 with the Nitro given not helping.

## 2018-02-01 NOTE — ED Notes (Signed)
Patient transported to X-ray 

## 2018-02-02 DIAGNOSIS — Z89512 Acquired absence of left leg below knee: Secondary | ICD-10-CM

## 2018-02-02 DIAGNOSIS — Z9889 Other specified postprocedural states: Secondary | ICD-10-CM

## 2018-02-02 DIAGNOSIS — D649 Anemia, unspecified: Secondary | ICD-10-CM | POA: Diagnosis not present

## 2018-02-02 DIAGNOSIS — E785 Hyperlipidemia, unspecified: Secondary | ICD-10-CM

## 2018-02-02 DIAGNOSIS — F17210 Nicotine dependence, cigarettes, uncomplicated: Secondary | ICD-10-CM

## 2018-02-02 DIAGNOSIS — I129 Hypertensive chronic kidney disease with stage 1 through stage 4 chronic kidney disease, or unspecified chronic kidney disease: Secondary | ICD-10-CM

## 2018-02-02 DIAGNOSIS — E1151 Type 2 diabetes mellitus with diabetic peripheral angiopathy without gangrene: Secondary | ICD-10-CM | POA: Diagnosis not present

## 2018-02-02 DIAGNOSIS — D631 Anemia in chronic kidney disease: Secondary | ICD-10-CM

## 2018-02-02 DIAGNOSIS — Z794 Long term (current) use of insulin: Secondary | ICD-10-CM

## 2018-02-02 DIAGNOSIS — R0789 Other chest pain: Secondary | ICD-10-CM

## 2018-02-02 DIAGNOSIS — I252 Old myocardial infarction: Secondary | ICD-10-CM

## 2018-02-02 DIAGNOSIS — I2510 Atherosclerotic heart disease of native coronary artery without angina pectoris: Secondary | ICD-10-CM

## 2018-02-02 DIAGNOSIS — E1122 Type 2 diabetes mellitus with diabetic chronic kidney disease: Secondary | ICD-10-CM | POA: Diagnosis not present

## 2018-02-02 DIAGNOSIS — Z79899 Other long term (current) drug therapy: Secondary | ICD-10-CM

## 2018-02-02 DIAGNOSIS — N19 Unspecified kidney failure: Secondary | ICD-10-CM | POA: Diagnosis not present

## 2018-02-02 DIAGNOSIS — N183 Chronic kidney disease, stage 3 (moderate): Secondary | ICD-10-CM | POA: Diagnosis not present

## 2018-02-02 DIAGNOSIS — Z955 Presence of coronary angioplasty implant and graft: Secondary | ICD-10-CM

## 2018-02-02 DIAGNOSIS — Z7982 Long term (current) use of aspirin: Secondary | ICD-10-CM

## 2018-02-02 DIAGNOSIS — R079 Chest pain, unspecified: Secondary | ICD-10-CM | POA: Diagnosis present

## 2018-02-02 DIAGNOSIS — Z8249 Family history of ischemic heart disease and other diseases of the circulatory system: Secondary | ICD-10-CM

## 2018-02-02 LAB — BASIC METABOLIC PANEL
Anion gap: 10 (ref 5–15)
BUN: 28 mg/dL — AB (ref 8–23)
CALCIUM: 8.8 mg/dL — AB (ref 8.9–10.3)
CO2: 27 mmol/L (ref 22–32)
CREATININE: 3.44 mg/dL — AB (ref 0.61–1.24)
Chloride: 106 mmol/L (ref 98–111)
GFR calc Af Amer: 20 mL/min — ABNORMAL LOW (ref >60)
GFR, EST NON AFRICAN AMERICAN: 18 mL/min — AB (ref >60)
Glucose, Bld: 114 mg/dL — ABNORMAL HIGH (ref 70–99)
Potassium: 3.2 mmol/L — ABNORMAL LOW (ref 3.5–5.1)
SODIUM: 143 mmol/L (ref 135–145)

## 2018-02-02 LAB — GLUCOSE, CAPILLARY
GLUCOSE-CAPILLARY: 129 mg/dL — AB (ref 70–99)
GLUCOSE-CAPILLARY: 87 mg/dL (ref 70–99)
Glucose-Capillary: 103 mg/dL — ABNORMAL HIGH (ref 70–99)
Glucose-Capillary: 130 mg/dL — ABNORMAL HIGH (ref 70–99)
Glucose-Capillary: 152 mg/dL — ABNORMAL HIGH (ref 70–99)

## 2018-02-02 LAB — HEMOGLOBIN AND HEMATOCRIT, BLOOD
HCT: 22.2 % — ABNORMAL LOW (ref 39.0–52.0)
Hemoglobin: 6.8 g/dL — CL (ref 13.0–17.0)

## 2018-02-02 LAB — TROPONIN I
Troponin I: <0.03 ng/mL
Troponin I: <0.03 ng/mL

## 2018-02-02 LAB — CBC
HCT: 25.2 % — ABNORMAL LOW (ref 39.0–52.0)
Hemoglobin: 8.1 g/dL — ABNORMAL LOW (ref 13.0–17.0)
MCH: 27.8 pg (ref 26.0–34.0)
MCHC: 32.1 g/dL (ref 30.0–36.0)
MCV: 86.6 fL (ref 78.0–100.0)
PLATELETS: 277 10*3/uL (ref 150–400)
RBC: 2.91 MIL/uL — AB (ref 4.22–5.81)
RDW: 15.5 % (ref 11.5–15.5)
WBC: 10.6 10*3/uL — ABNORMAL HIGH (ref 4.0–10.5)

## 2018-02-02 LAB — PREPARE RBC (CROSSMATCH)

## 2018-02-02 MED ORDER — FERROUS SULFATE 325 (65 FE) MG PO TABS
325.0000 mg | ORAL_TABLET | Freq: Every day | ORAL | Status: DC
  Administered 2018-02-02: 325 mg via ORAL
  Filled 2018-02-02: qty 1

## 2018-02-02 MED ORDER — FERUMOXYTOL INJECTION 510 MG/17 ML
510.0000 mg | Freq: Once | INTRAVENOUS | Status: AC
  Administered 2018-02-02: 510 mg via INTRAVENOUS
  Filled 2018-02-02: qty 17

## 2018-02-02 MED ORDER — ASPIRIN EC 81 MG PO TBEC
81.0000 mg | DELAYED_RELEASE_TABLET | Freq: Two times a day (BID) | ORAL | Status: DC
  Administered 2018-02-02 (×2): 81 mg via ORAL
  Filled 2018-02-02 (×2): qty 1

## 2018-02-02 MED ORDER — FENTANYL CITRATE (PF) 100 MCG/2ML IJ SOLN
100.0000 ug | Freq: Once | INTRAMUSCULAR | Status: DC
  Filled 2018-02-02: qty 2

## 2018-02-02 MED ORDER — ACETAMINOPHEN 500 MG PO TABS
1000.0000 mg | ORAL_TABLET | Freq: Four times a day (QID) | ORAL | Status: DC | PRN
  Administered 2018-02-02: 1000 mg via ORAL
  Filled 2018-02-02: qty 2

## 2018-02-02 MED ORDER — ROSUVASTATIN CALCIUM 20 MG PO TABS
20.0000 mg | ORAL_TABLET | Freq: Every evening | ORAL | Status: DC
  Administered 2018-02-02: 20 mg via ORAL
  Filled 2018-02-02: qty 1

## 2018-02-02 MED ORDER — VITAMIN B-12 1000 MCG PO TABS
1000.0000 ug | ORAL_TABLET | Freq: Every day | ORAL | Status: DC
  Administered 2018-02-02: 1000 ug via ORAL
  Filled 2018-02-02: qty 1

## 2018-02-02 MED ORDER — INSULIN ASPART 100 UNIT/ML ~~LOC~~ SOLN
0.0000 [IU] | Freq: Three times a day (TID) | SUBCUTANEOUS | Status: DC
  Administered 2018-02-02: 2 [IU] via SUBCUTANEOUS
  Administered 2018-02-02: 1 [IU] via SUBCUTANEOUS

## 2018-02-02 MED ORDER — POTASSIUM CHLORIDE CRYS ER 20 MEQ PO TBCR
40.0000 meq | EXTENDED_RELEASE_TABLET | Freq: Two times a day (BID) | ORAL | Status: AC
  Administered 2018-02-02 (×2): 40 meq via ORAL
  Filled 2018-02-02 (×2): qty 2

## 2018-02-02 MED ORDER — METOPROLOL TARTRATE 25 MG PO TABS
100.0000 mg | ORAL_TABLET | Freq: Two times a day (BID) | ORAL | Status: DC
  Administered 2018-02-02 (×2): 100 mg via ORAL
  Filled 2018-02-02 (×2): qty 4

## 2018-02-02 MED ORDER — HYDRALAZINE HCL 50 MG PO TABS
100.0000 mg | ORAL_TABLET | Freq: Three times a day (TID) | ORAL | Status: DC
  Administered 2018-02-02 (×3): 100 mg via ORAL
  Filled 2018-02-02 (×3): qty 2

## 2018-02-02 MED ORDER — AMLODIPINE BESYLATE 10 MG PO TABS
10.0000 mg | ORAL_TABLET | Freq: Every day | ORAL | Status: DC
  Administered 2018-02-02: 10 mg via ORAL
  Filled 2018-02-02: qty 1

## 2018-02-02 MED ORDER — POLYETHYLENE GLYCOL 3350 17 G PO PACK: 17.0000 g | PACK | Freq: Every day | ORAL | Status: DC | PRN

## 2018-02-02 MED ORDER — INSULIN ASPART 100 UNIT/ML ~~LOC~~ SOLN: 0.0000 [IU] | Freq: Every day | SUBCUTANEOUS | Status: DC

## 2018-02-02 MED ORDER — RENA-VITE PO TABS
1.0000 | ORAL_TABLET | Freq: Every day | ORAL | Status: DC
  Administered 2018-02-02: 1 via ORAL
  Filled 2018-02-02: qty 1

## 2018-02-02 MED ORDER — GABAPENTIN 300 MG PO CAPS
300.0000 mg | ORAL_CAPSULE | Freq: Every day | ORAL | Status: DC
  Administered 2018-02-02: 300 mg via ORAL
  Filled 2018-02-02: qty 1

## 2018-02-02 MED ORDER — SODIUM CHLORIDE 0.9% IV SOLUTION: Freq: Once | INTRAVENOUS | Status: DC

## 2018-02-02 MED ORDER — SENNOSIDES-DOCUSATE SODIUM 8.6-50 MG PO TABS: 1.0000 | ORAL_TABLET | Freq: Every evening | ORAL | Status: DC | PRN

## 2018-02-02 NOTE — Progress Notes (Signed)
Patient discharged to [facility] via [facility]. Pt's IV removed by previous shift RN Lauren. All nighttime meds given to patient prior to discharge to facility. RN gave this info to [facility].

## 2018-02-02 NOTE — Discharge Summary (Signed)
Name: Joel Hebert MRN: [id number] DOB: 11-27-1955 62 y.o. PCP: Patient, No Pcp Per  Date of Admission: 02/01/2018 10:38 PM Date of Discharge:  Attending Physician: Earl Lagos, MD  Discharge Diagnosis: 1.  Symptomatic anemia. 2.  Atypical chest pain.  Discharge Medications: Allergies as of 02/02/2018   No Known Allergies     Medication List    STOP taking these medications   insulin aspart 100 UNIT/ML injection Commonly known as:  novoLOG   multivitamin Tabs tablet   senna-docusate 8.6-50 MG tablet Commonly known as:  Senokot-S     TAKE these medications   acetaminophen 500 MG tablet Commonly known as:  TYLENOL Take 500 mg by mouth every 6 (six) hours as needed for mild pain or headache.   ADMELOG SOLOSTAR 100 UNIT/ML KiwkPen Generic drug:  insulin lispro Inject 0-10 Units into the skin 3 (three) times daily before meals. Per sliding scale: CBG 0-150 0 units, 151-200 2 units, 201-250 4 units, 251-300 6 units, 301-350 8 units 351-400 10 units, call PCP if CBG <70 or >400   amLODipine 10 MG tablet Commonly known as:  NORVASC Take 1 tablet (10 mg total) by mouth at bedtime.   aspirin EC 81 MG tablet Take 81 mg by mouth 2 (two) times daily.   clopidogrel 75 MG tablet Commonly known as:  PLAVIX Take 1 tablet (75 mg total) by mouth daily with breakfast.   cyanocobalamin 1000 MCG tablet Take 1 tablet (1,000 mcg total) by mouth daily.   feeding supplement (NEPRO CARB STEADY) Liqd Take 237 mLs by mouth daily.   ferrous sulfate 325 (65 FE) MG tablet Take 1 tablet (325 mg total) by mouth daily with breakfast.   gabapentin 300 MG capsule Commonly known as:  NEURONTIN Take 1 capsule (300 mg total) by mouth at bedtime.   hydrALAZINE 100 MG tablet Commonly known as:  APRESOLINE Take 1 tablet (100 mg total) by mouth every 8 (eight) hours.   hydrocortisone cream 1 % Apply 1 application topically 3 (three) times daily as needed for itching (minor skin  irritation).   metFORMIN 850 MG tablet Commonly known as:  GLUCOPHAGE Take 850 mg by mouth 2 (two) times daily with a meal.   methocarbamol 500 MG tablet Commonly known as:  ROBAXIN Take 1 tablet (500 mg total) by mouth every 8 (eight) hours as needed for muscle spasms.   metoprolol tartrate 100 MG tablet Commonly known as:  LOPRESSOR Take 100 mg by mouth 2 (two) times daily.   multivitamin with minerals Tabs tablet Take 1 tablet by mouth at bedtime.   nitroGLYCERIN 0.4 MG SL tablet Commonly known as:  NITROSTAT Place 0.4 mg under the tongue every 5 (five) minutes as needed for chest pain.   oxyCODONE-acetaminophen 10-325 MG tablet Commonly known as:  PERCOCET Take 1 tablet by mouth every 4 (four) hours as needed for pain. What changed:  when to take this   polyethylene glycol packet Commonly known as:  MIRALAX / GLYCOLAX Take 17 g by mouth daily as needed for mild constipation.   rosuvastatin 20 MG tablet Commonly known as:  CRESTOR Take 20 mg by mouth every evening.   senna 8.6 MG Tabs tablet Commonly known as:  SENOKOT Take 1 tablet by mouth at bedtime as needed (constipation).       Disposition and follow-up:   Joel Hebert was discharged from [facility] in Good condition.  At the hospital follow up visit please address:  1.  His CBC-whether he followed up with GI or not. -Inquire about his nephrology follow-up.  2.  Labs / imaging needed at time of follow-up: CBC, BMP.  3.  Pending labs/ test needing follow-up: None  Follow-up Appointments:   Hospital Course by problem list: 62 year old male with a PMH ofhypertension, diabetes, CKD stage III, hyperlipidemia, coronary artery disease, and peripheral artery disease status [facility]   Chest pain:  Appears atypical, most likely musculoskeletal, can be secondary to anemia due to demand ischemia.   Chest pain never resolved with nitroglycerin or aspirin.  Responded well to 1 dose of fentanyl and never recur.  EKG remained without any acute change and troponin x3 remain negative.  Patient was observed because of his previous history of coronary artery disease.  He was advised to continue aspirin, rosuvastatin and metoprolol.  Anemia.  Patient was found to have an hemoglobin of 7.2 on admission.  He was discharged at 8.2 a week ago.  His anemia was thought to be due to his renal failure secondary to sepsis and hemorrhage during surgeries.  He was also found to be iron deficit and FOBT positive.  He does not have any other sign of bleeding, denies any hematemesis, melena or hematuria.  During previous hospitalization he was advised to follow-up with GI as an outpatient. Please make sure that patient follow-up with gastroenterology for further work-up.  He was given 1 unit of packed RBCs and 1 dose of Feraheme.  His hemoglobin on discharge was 8.1  Renal failure.  Patient developed renal failure during previous hospitalization secondary to severe sepsis and hemorrhage in the setting of recurrent amputations.  He had HD 4 times during that hospitalization. Renal function continued to improve.  Patient had an outpatient nephrology appointment next week.  Diabetes.  CBG remained well controlled during current hospitalization.  He will continue home dose of Metformin and sliding scale on discharge.  Hypertension.  Pressure remained little elevated. He will continue his home dose of amlodipine, hydralazine and metoprolol. His PCP can add another agent for a better control.  Discharge Vitals:   BP (!) 164/77 (BP Location: Right Arm)   Pulse 86   Temp 98.5 F (36.9 C) (Oral)   Resp 19   Ht 5\' 8"  (1.727 m)   Wt 196 lb 10.4 oz (89.2 kg)   SpO2 96%   BMI 29.90 kg/m   General.  Well-developed, well-nourished gentleman, in no acute distress. Lungs.  Clear bilaterally, normal work of breathing. CV.   Regular rate and rhythm. Abdomen.  Soft, nontender, bowel sounds positive. Extremities.  Left BKA, well-healing stump with some scabs, no edema.  Pertinent Labs, Studies, and Procedures:  CBC Latest Ref Rng & Units 02/02/2018 02/02/2018 02/01/2018  WBC 4.0 - 10.5 K/uL 10.6(H) - -  Hemoglobin 13.0 - 17.0 g/dL 8.1(L) 6.8(LL) 7.8(L)  Hematocrit 39.0 - 52.0 % 25.2(L) 22.2(L) 23.0(L)  Platelets 150 - 400 K/uL 277 - -   BMP Latest Ref Rng & Units 02/02/2018 02/01/2018 01/23/2018  Glucose 70 - 99 mg/dL 161(W) 960(A) 540(J)  BUN 8 - 23 mg/dL 81(X) 91(Y) 78(G)  Creatinine 0.61 - 1.24 mg/dL 9.56(O) 1.30(Q) 6.57(Q)  Sodium 135 - 145 mmol/L 143 143 140  Potassium 3.5 - 5.1 mmol/L 3.2(L) 3.4(L) 3.8  Chloride 98 - 111 mmol/L 106 104 103  CO2 22 - 32 mmol/L 27 - 28  Calcium 8.9 - 10.3 mg/dL 4.6(N) - 6.2(X)  Troponin.<0.03 X 3.  Discharge Instructions: Discharge Instructions    Diet - low sodium heart healthy   Complete by:  As directed    Discharge instructions   Complete by:  As directed    It was pleasure taking care of you. Please follow-up with gastroenterologist as advised before.  You might need colonoscopy for further work-up of your anemia. Please follow-up with your kidney doctor according to your schedule appointment. Continue taking all your medications as you were taking it before.   Increase activity slowly   Complete by:  As directed       Signed: Arnetha CourserAmin, Kendrea Cerritos, MD 02/02/2018, 6:10 PM   Pager: [phone]

## 2018-02-02 NOTE — Discharge Instructions (Signed)
It was pleasure taking care of you. Please follow-up with gastroenterologist as advised before.  You might need colonoscopy for further work-up of your anemia. Please follow-up with your kidney doctor according to your schedule appointment. Continue taking all your medications as you were taking it before.

## 2018-02-02 NOTE — Progress Notes (Signed)
   Subjective: Patient was feeling better when seen this morning.  Denies any more chest pain or shortness of breath.  Nitro never helped with his chest pain which was relieved with fentanyl. Patient denies any stump pain at his amputation site, stating that since they remove the wound VAC he is not experiencing much pain.  Objective:  Vital signs in last 24 hours: Vitals:   02/02/18 0938 02/02/18 1315 02/02/18 1414 02/02/18 1550  BP: (!) 172/77 (!) 164/73 (!) 159/71 (!) 164/77  Pulse: 94 79 86 86  Resp:   20 19  Temp: 98.4 F (36.9 C) 98.6 F (37 C) 98.1 F (36.7 C) 98.5 F (36.9 C)  TempSrc: Oral Oral Oral Oral  SpO2: 95% 96% 96% 96%  Weight:      Height:       General.  Well-developed, well-nourished gentleman, in no acute distress. Lungs.  Clear bilaterally, normal work of breathing. CV.  Regular rate and rhythm. Abdomen.  Soft, nontender, bowel sounds positive. Extremities.  Left BKA, well-healing stump with some scabs, no edema.   Assessment/Plan:  62 year old male with a PMH of hypertension, diabetes, CKD stage III, hyperlipidemia, coronary artery disease, and peripheral artery disease status post left tibial amputation [facility] [place] [facility].   Chest pain:  Resolved with fentanyl, no recurrence.  EKG without any acute change and troponin remain negative x3.  Pain never improved with nitroglycerin, atypical for cardiac origin pain.  Most likely musculoskeletal, or secondary to symptomatic anemia. -Continue aspirin and rosuvastatin. -Continue home dose of metoprolol.  Anemia.  Patient was found to have hemoglobin of 7.2 on admission.  He continued to drop his hemoglobin since his last surgery and during his previous hospitalization with renal failure.  He was found to be FOBT positive and was given an outpatient GI follow-up referral. Patient denies any hematemesis or melena.  He denies any hematuria. -He was given 1 unit of packed  RBCs with improvement in hemoglobin to 8.1. -He was having iron of 24 earlier this month, discharged on p.o. iron supplement. -We will give 1 dose of Feraheme to load up his iron stores. -Will be discharged back to his facility to continue his rehab. -We will need monitoring of his CBC. -Patient was advised to follow-up with GI.  Diabetes.  CBG remained between 103-152 over the last 24-hour. -Continue sliding scale. -Continue Neurontin.  Renal failure.  Patient developed renal failure during previous hospitalization secondary to severe sepsis and hemorrhage in the setting of recurrent amputations.  He had HD 4 times during that hospitalization. Renal function continued to improve.  He has BUN of 30 with creatinine of 3.8 during current admission. -Repeat BMP in the morning. -Had outpatient nephrology appointment next week.  Dispo: Anticipated discharge in approximately 0-1 day(s).   Arnetha CourserAmin, Axtyn Woehler, MD 02/02/2018, 5:35 PM Pager: [phone]

## 2018-02-02 NOTE — Progress Notes (Addendum)
Paged internal medicine and notified them that pt's hbg went down to 6.8mg /dl after receiving 1 unit of blood.  CBC rechecked, Hbg was 8.1mg /dl

## 2018-02-02 NOTE — ED Notes (Signed)
Pt denies CP at this time, admitting at bedside

## 2018-02-02 NOTE — ED Notes (Signed)
Bandage removed off amputation upon request of Palumbo, MD.

## 2018-02-02 NOTE — ED Notes (Signed)
Delay in lab draw,  MD's currently at bedside.

## 2018-02-02 NOTE — H&P (Signed)
Date: 02/02/2018               Patient Name:  Joel Hebert MRN: [id number]  DOB: 05-Jul-1956 Age / Sex: 62 y.o., male   PCP: Patient, No Pcp Per         Medical Service: Internal Medicine Teaching Service         Attending Physician: Dr. Earl Lagos, MD    First Contact: Dr. Maryla Morrow Pager: [phone]  Second Contact: Dr. Nelson Chimes Pager: [phone]       After Hours (After 5p/  First Contact Pager: [phone]  weekends / holidays): Second Contact Pager: [phone]   Chief Complaint: Chest pain  History of Present Illness: This is a 62 year old male with a PMH of hypertension, diabetes, CKD stage III, hyperlipidemia, coronary artery disease, and peripheral artery disease status post left tibial amputation presenting from [facility] [place] with chest pain.  He states the pain started last night around 9 PM, it was located in the substernal area, constant pressure sensation, 7 out of 10, no radiation.  He was asleep when it started.  Patient did not have any palpitations and area was not tender to palpation.  He reports that he feels like it did when he had a heart attack in the past.  He was at the rehab facility and EMS was called.  Patient was given 4 of nitroglycerin while on route as well as aspirin.  Patient reports that this did not help.  While in the ED he was given 40 fentanyl.  Patient reports that this relieved the pain.  He denies any other symptoms with the chest pain, including nausea, diaphoresis, dizziness, lightheadedness, headaches, fevers, chills, shortness of breath.  Patient denies any chest pain at this time.   Per chart review, he had a MI in 2016, he did not undero PCI at that time. He also had an NSTEMI in 2017 s/p PCI and drug-eluting LCx stent, and he also had PCI of the ramus. He did wear a holter monitor in Dec 2017 which showed NSR with rare PACs.  Of note patient was hospitalized from 6/27-7/19 due to sepsis, acute renal failure, CAD, and acute on  chronic anemia.  Patient was started on B12 and was already taking oral iron for his CKD, and FOBT was positive, hemoglobin is stable at 8.2 on discharge, patient was referred to GI for follow-up.  In the ED he was afebrile, RR 18, HR 98, BP 179/91.  CBC showed a hemoglobin of 7.2.  BMP showed K3.14, BUN 30, CR 3.80. Initial troponin was 0.00. CXR showed central pulmonary vascular congestion, minimal pleural effusion may be present. EKG showed a NSR with no ST changes.  Patient was given fentanyl in the ED. patient was admitted to internal medicine for further evaluation.  Meds:  No current facility-administered medications on file prior to encounter.    Current Outpatient Medications on File Prior to Encounter  Medication Sig Dispense Refill  . acetaminophen (TYLENOL) 500 MG tablet Take 500 mg by mouth every 6 (six) hours as needed for mild pain or headache.     Marland Kitchen amLODipine (NORVASC) 10 MG tablet Take 1 tablet (10 mg total) by mouth at bedtime.    Marland Kitchen aspirin EC 81 MG tablet Take 81 mg by mouth 2 (two) times daily.     . clopidogrel (PLAVIX) 75 MG tablet Take 1 tablet (75 mg total) by mouth daily with breakfast. 30 tablet 11  .  ferrous sulfate 325 (65 FE) MG tablet Take 1 tablet (325 mg total) by mouth daily with breakfast.  3  . gabapentin (NEURONTIN) 300 MG capsule Take 1 capsule (300 mg total) by mouth at bedtime.    . hydrALAZINE (APRESOLINE) 100 MG tablet Take 1 tablet (100 mg total) by mouth every 8 (eight) hours.    . hydrocortisone cream 1 % Apply 1 application topically 3 (three) times daily as needed for itching (minor skin irritation). 30 g 0  . insulin aspart (NOVOLOG) 100 UNIT/ML injection Inject 0-15 Units into the skin 3 (three) times daily with meals. 10 mL 11  . metFORMIN (GLUCOPHAGE) 850 MG tablet Take 850 mg by mouth 2 (two) times daily with a meal.    . methocarbamol (ROBAXIN) 500 MG tablet Take 1 tablet (500 mg total) by mouth every 8 (eight) hours as needed for muscle spasms.  (Patient not taking: Reported on 01/03/2018) 30 tablet 0  . metoprolol tartrate (LOPRESSOR) 100 MG tablet Take 100 mg by mouth 2 (two) times daily.    . multivitamin (RENA-VIT) TABS tablet Take 1 tablet by mouth at bedtime.  0  . nitroGLYCERIN (NITROSTAT) 0.4 MG SL tablet Place 0.4 mg under the tongue every 5 (five) minutes as needed for chest pain.    . Nutritional Supplements (FEEDING SUPPLEMENT, NEPRO CARB STEADY,) LIQD Take 237 mLs by mouth daily.  0  . oxyCODONE-acetaminophen (PERCOCET) 10-325 MG tablet Take 1 tablet by mouth every 4 (four) hours as needed for pain. 30 tablet 0  . polyethylene glycol (MIRALAX / GLYCOLAX) packet Take 17 g by mouth daily as needed for mild constipation. 14 each 0  . rosuvastatin (CRESTOR) 20 MG tablet Take 20 mg by mouth every evening.     . senna-docusate (SENOKOT-S) 8.6-50 MG tablet Take 1 tablet by mouth at bedtime as needed for mild constipation.    . vitamin B-12 1000 MCG tablet Take 1 tablet (1,000 mcg total) by mouth daily.      No outpatient medications have been marked as taking for the 02/01/18 encounter [facility](Hospital Encounter).     Allergies: Allergies as of 02/01/2018  . (No Known Allergies)   Past Medical History:  Diagnosis Date  . Coronary artery disease   . High cholesterol   . Hypertension   . MI (myocardial infarction) (HCC) 08/13/2014; 01/2016- 03/2016 X 3  . PAD (peripheral artery disease) (HCC)   . Peripheral vascular disease (HCC)   . Type II diabetes mellitus (HCC)     Family History: Father died of CHF at age 63.  Brother died of CHF at age 63.  Social History: Smokes 7 to 8 cigarettes a day, started smoking 40 years ago.  Denies alcohol or drug use.  Lives at a rehab facility due to a left leg amputation.  Review of Systems: A complete ROS was negative except as per HPI.   Physical Exam: Blood pressure (!) 172/66, pulse 98, temperature 98.6 F (37 C), temperature source Oral, resp. rate 14, height 5\' 8"  (1.727 m), weight 202  lb (91.6 kg), SpO2 98 %. General: alert, cooperative and no distress HEENT: PERRLA and neck supple with midline trachea Heart: S1, S2 normal, no murmur, rub or gallop, regular rate and rhythm Lungs: clear to auscultation, no wheezes or rales and unlabored breathing Abdomen: abdomen is soft without significant tenderness, masses, organomegaly or guarding Extremities:left lower extremity BKA, no drainage around area, non-tender, right LE 1+ pitting edema Skin:no rashes, no jaundice Neurology: normal without focal  findings, mental status, speech normal, alert and oriented x3 and PERLA Psych: normal mood and affect  EKG: personally reviewed my interpretation is rate of 94 bpm, normal sinus rhythm, right axis deviation, borderline QT prolongation of 380, no ST changes.  CXR: personally reviewed my interpretation is increased pulmonary vasculature bilaterally, no cardiomegaly, no fracture seen, mild bibasilar edema.  Assessment & Plan by Problem: This is a 62 year old male with a PMH of hypertension, diabetes, CKD stage III, hyperlipidemia, coronary artery disease, and peripheral artery disease status post left tibial amputation presenting from [facility] [place] with chest pain.   Chest pain: Patient presents substernal chest pain that occurred at rest with no relief from nitroglycerin.  The pain was nonradiating, constant pressure sensation.  No diaphoresis nausea palpitations dizziness or lightheadedness.  On exam he appeared comfortable, not in acute distress, cardiac exam showed regular rate and rhythm, chest was nontender to palpation.  Labs showed an anemia of 7.2.  His initial EKG showed no ST changes.  Troponin x1 was negative.  Patient has a significant history of cardiac events including MI in 2016, and a STEMI in 2017 s/p DES in the LCx and ramus. Patient also has a significant smoking history. There is a concern for an acute coronary syndrome, such as unstable angina, given his  history and presentation.  There is also concern of symptomatic anemia resulting in the symptoms. - Heart score 4 -Consider cardiology consult - EKG showed normal sinus rhythm with no ST changes, repeat ekg in AM -Continue home hydralazine -Continue home metoprolol -Continue home rosuvastatin - BMP - CBC - Troponin x2, initial troponin negative - ASA 81 twice daily  Anemia: Hemoglobin 7.2 on admission.  During previous admission patient received 4 units of packed red blood cells and had a positive FOBT.  He was planned for an outpatient GI follow-up. - 1 unit packed red blood cells,  - H&H posttransfusion - Transfusion goal of 8.0 for CAD  DM type 2: Patient is on metformin and NovoLog at home.  Glucose on admission was 141. -Hold metformin -SSI sensitive -CBG q4 hr -Continue home Neurontin  CKD: On admission patient's BUN 30, creatinine 3.8.  This is lower than his baseline of BUN around 43 and creatinine of around 5.3.  Patient has good UOP. -BMP in a.m.  FEN: No fluids, replete lytes prn, NPO VTE ppx: SCDs Code Status: FULL   Dispo: Admit patient to Inpatient with expected length of stay greater than 2 midnights.  Signed: Claudean Severance, MD 02/02/2018, 2:39 AM  Pager: [phone]

## 2018-02-02 NOTE — ED Notes (Signed)
Delay in lab draw,  MD at bedside. 

## 2018-02-02 NOTE — Progress Notes (Signed)
Nsg Discharge Note  Admit Date:  02/01/2018 Discharge date: 02/02/2018   Bronson CurbMelvin Robert J Sanor to be D/C'd [facility] per MD order.  AVS completed.  Copy for chart, and copy for patient signed, and dated. Patient/caregiver able to verbalize understanding.  Discharge Medication: Allergies as of 02/02/2018   No Known Allergies     Medication List    STOP taking these medications   insulin aspart 100 UNIT/ML injection Commonly known as:  novoLOG   multivitamin Tabs tablet   senna-docusate 8.6-50 MG tablet Commonly known as:  Senokot-S     TAKE these medications   acetaminophen 500 MG tablet Commonly known as:  TYLENOL Take 500 mg by mouth every 6 (six) hours as needed for mild pain or headache.   ADMELOG SOLOSTAR 100 UNIT/ML KiwkPen Generic drug:  insulin lispro Inject 0-10 Units into the skin 3 (three) times daily before meals. Per sliding scale: CBG 0-150 0 units, 151-200 2 units, 201-250 4 units, 251-300 6 units, 301-350 8 units 351-400 10 units, call PCP if CBG <70 or >400   amLODipine 10 MG tablet Commonly known as:  NORVASC Take 1 tablet (10 mg total) by mouth at bedtime.   aspirin EC 81 MG tablet Take 81 mg by mouth 2 (two) times daily.   clopidogrel 75 MG tablet Commonly known as:  PLAVIX Take 1 tablet (75 mg total) by mouth daily with breakfast.   cyanocobalamin 1000 MCG tablet Take 1 tablet (1,000 mcg total) by mouth daily.   feeding supplement (NEPRO CARB STEADY) Liqd Take 237 mLs by mouth daily.   ferrous sulfate 325 (65 FE) MG tablet Take 1 tablet (325 mg total) by mouth daily with breakfast.   gabapentin 300 MG capsule Commonly known as:  NEURONTIN Take 1 capsule (300 mg total) by mouth at bedtime.   hydrALAZINE 100 MG tablet Commonly known as:  APRESOLINE Take 1 tablet (100 mg total) by mouth every 8 (eight) hours.   hydrocortisone cream 1 % Apply 1 application topically 3 (three) times daily as needed for itching (minor skin irritation).    metFORMIN 850 MG tablet Commonly known as:  GLUCOPHAGE Take 850 mg by mouth 2 (two) times daily with a meal.   methocarbamol 500 MG tablet Commonly known as:  ROBAXIN Take 1 tablet (500 mg total) by mouth every 8 (eight) hours as needed for muscle spasms.   metoprolol tartrate 100 MG tablet Commonly known as:  LOPRESSOR Take 100 mg by mouth 2 (two) times daily.   multivitamin with minerals Tabs tablet Take 1 tablet by mouth at bedtime.   nitroGLYCERIN 0.4 MG SL tablet Commonly known as:  NITROSTAT Place 0.4 mg under the tongue every 5 (five) minutes as needed for chest pain.   oxyCODONE-acetaminophen 10-325 MG tablet Commonly known as:  PERCOCET Take 1 tablet by mouth every 4 (four) hours as needed for pain. What changed:  when to take this   polyethylene glycol packet Commonly known as:  MIRALAX / GLYCOLAX Take 17 g by mouth daily as needed for mild constipation.   rosuvastatin 20 MG tablet Commonly known as:  CRESTOR Take 20 mg by mouth every evening.   senna 8.6 MG Tabs tablet Commonly known as:  SENOKOT Take 1 tablet by mouth at bedtime as needed (constipation).       Discharge Assessment: Vitals:   02/02/18 1414 02/02/18 1550  BP: (!) 159/71 (!) 164/77  Pulse: 86 86  Resp: 20 19  Temp: 98.1 F (36.7 C) 98.5  F (36.9 C)  SpO2: 96% 96%   Skin clean, dry and intact without evidence of skin break down, no evidence of skin tears noted. IV catheter discontinued intact. Site without signs and symptoms of complications - no redness or edema noted at insertion site, patient denies c/o pain - only slight tenderness at site.  Dressing with slight pressure applied.  D/c Instructions-Education: Discharge instructions given to Jone Baseman, RN with verbalized understanding. D/c education completed with Simeon Craft RN including follow up instructions, medication list, d/c activities limitations if indicated, with other d/c instructions as indicated by MD -RN able to  verbalize understanding, all questions fully answered. Patient instructed to return to ED, call 911, or call MD for any changes in condition. [facility] transported pt back to [facility]. PatientLauren Dwan Bolt, RN 02/02/2018 6:41 PM

## 2018-02-03 LAB — TYPE AND SCREEN
ABO/RH(D): O POS
Antibody Screen: NEGATIVE
UNIT DIVISION: 0
Unit division: 0

## 2018-02-03 LAB — BPAM RBC
Blood Product Expiration Date: 201908302359
Blood Product Expiration Date: 201908302359
ISSUE DATE / TIME: 201907271317
ISSUE DATE / TIME: 201907270905
UNIT TYPE AND RH: 5100
Unit Type and Rh: 5100

## 2018-02-05 ENCOUNTER — Inpatient Hospital Stay (INDEPENDENT_AMBULATORY_CARE_PROVIDER_SITE_OTHER): Payer: MEDICAID | Admitting: Orthopedic Surgery

## 2018-02-11 ENCOUNTER — Ambulatory Visit: Payer: Self-pay | Admitting: Nurse Practitioner

## 2018-02-13 ENCOUNTER — Other Ambulatory Visit: Payer: Self-pay | Admitting: Gastroenterology

## 2018-02-15 ENCOUNTER — Encounter (HOSPITAL_COMMUNITY): Payer: Self-pay

## 2018-02-15 ENCOUNTER — Emergency Department (HOSPITAL_COMMUNITY)
Admission: EM | Admit: 2018-02-15 | Discharge: 2018-02-15 | Disposition: A | Discharge status: Home / Self Care | Payer: Medicaid Other | Attending: Emergency Medicine | Admitting: Emergency Medicine

## 2018-02-15 ENCOUNTER — Telehealth (INDEPENDENT_AMBULATORY_CARE_PROVIDER_SITE_OTHER): Payer: Self-pay

## 2018-02-15 DIAGNOSIS — F17210 Nicotine dependence, cigarettes, uncomplicated: Secondary | ICD-10-CM | POA: Insufficient documentation

## 2018-02-15 DIAGNOSIS — Z89512 Acquired absence of left leg below knee: Secondary | ICD-10-CM

## 2018-02-15 DIAGNOSIS — T8130XA Disruption of wound, unspecified, initial encounter: Secondary | ICD-10-CM | POA: Diagnosis not present

## 2018-02-15 DIAGNOSIS — Y999 Unspecified external cause status: Secondary | ICD-10-CM | POA: Diagnosis not present

## 2018-02-15 DIAGNOSIS — Z79899 Other long term (current) drug therapy: Secondary | ICD-10-CM | POA: Diagnosis not present

## 2018-02-15 DIAGNOSIS — Y658 Other specified misadventures during surgical and medical care: Secondary | ICD-10-CM | POA: Diagnosis not present

## 2018-02-15 DIAGNOSIS — I10 Essential (primary) hypertension: Secondary | ICD-10-CM | POA: Insufficient documentation

## 2018-02-15 DIAGNOSIS — Y93B1 Activity, exercise machines primarily for muscle strengthening: Secondary | ICD-10-CM | POA: Diagnosis not present

## 2018-02-15 DIAGNOSIS — E119 Type 2 diabetes mellitus without complications: Secondary | ICD-10-CM | POA: Diagnosis not present

## 2018-02-15 DIAGNOSIS — I2510 Atherosclerotic heart disease of native coronary artery without angina pectoris: Secondary | ICD-10-CM | POA: Insufficient documentation

## 2018-02-15 DIAGNOSIS — Z7902 Long term (current) use of antithrombotics/antiplatelets: Secondary | ICD-10-CM | POA: Diagnosis not present

## 2018-02-15 DIAGNOSIS — Z794 Long term (current) use of insulin: Secondary | ICD-10-CM | POA: Diagnosis not present

## 2018-02-15 DIAGNOSIS — Z7982 Long term (current) use of aspirin: Secondary | ICD-10-CM | POA: Insufficient documentation

## 2018-02-15 DIAGNOSIS — S8992XA Unspecified injury of left lower leg, initial encounter: Secondary | ICD-10-CM | POA: Diagnosis present

## 2018-02-15 DIAGNOSIS — Z955 Presence of coronary angioplasty implant and graft: Secondary | ICD-10-CM | POA: Insufficient documentation

## 2018-02-15 DIAGNOSIS — Y92538 Other ambulatory health services establishments as the place of occurrence of the external cause: Secondary | ICD-10-CM | POA: Insufficient documentation

## 2018-02-15 DIAGNOSIS — W19XXXA Unspecified fall, initial encounter: Secondary | ICD-10-CM

## 2018-02-15 MED ORDER — OXYCODONE-ACETAMINOPHEN 5-325 MG PO TABS
1.0000 | ORAL_TABLET | Freq: Once | ORAL | Status: AC
  Administered 2018-02-15: 1 via ORAL
  Filled 2018-02-15: qty 1

## 2018-02-15 NOTE — Telephone Encounter (Signed)
Melanee Leftina ADON of SNF she lm on vm to advise that the pt had a fall from his wheelchair and that he had direct impact on stump with uncontrolled bleeding. Advised to proceed to the ER per ADON possible arterial bleed.

## 2018-02-15 NOTE — ED Notes (Signed)
STILL WAITING ON PTAR 

## 2018-02-15 NOTE — ED Provider Notes (Signed)
[facility] EMERGENCY DEPARTMENT Provider Note   CSN: [id number] Arrival date & time: 02/15/18  1207     History   Chief Complaint Chief Complaint  Patient presents with  . Post-op Problem    HPI Joel Hebert is a 62 y.o. male.  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  Patient reports that he getting onto a machine bike today at physical therapy and the arm rest accidentally went forward and he fell onto his left stump. He denies hitting his head or loss of consciousness. States that stump started to bleed a little bit and the facility called the ambulance. States that he has pain over the stump with palpation. Is asking for percocet. No medication prior to arrival. He takes plavix, no other blood thinners. Denies fever, chills, lightheadedness, syncope, chest pain, sob, abdominal pain, vomiting, numbness, weakness. He has an appointment with Dr. Lajoyce Corners next week.   Past Medical History:  Diagnosis Date  . Coronary artery disease   . High cholesterol   . Hypertension   . MI (myocardial infarction) (HCC) 08/13/2014; 01/2016- 03/2016 X 3  . PAD (peripheral artery disease) (HCC)   . Peripheral vascular disease (HCC)   . Type II diabetes mellitus [facility])     Patient Active Problem List   Diagnosis Date Noted  . Chest pain 02/02/2018  . Symptomatic anemia 02/02/2018  . Anemia   . Uremia due to inadequate renal perfusion   . Acute renal failure (HCC)   . Sepsis (HCC) 01/03/2018  . Dehiscence of amputation stump (HCC) 12/27/2017  . History of transmetatarsal amputation of left foot (HCC) 12/14/2017  . Gangrene of left foot (HCC)   . Atherosclerosis of native artery of left lower extremity with gangrene (HCC)  11/19/2017  . Peripheral artery disease (HCC) 11/08/2017  . PAD (peripheral artery disease) (HCC) 08/14/2017    Past Surgical History:  Procedure Laterality Date  . ABDOMINAL AORTOGRAM W/LOWER EXTREMITY  08/14/2017  . ABDOMINAL AORTOGRAM W/LOWER EXTREMITY N/A 08/14/2017   Procedure: ABDOMINAL AORTOGRAM W/LOWER EXTREMITY;  Surgeon: Nada Libman, MD;  [facility] INVASIVE CV LAB;  Service: Cardiovascular;  Laterality: N/A;  . ABDOMINAL AORTOGRAM W/LOWER EXTREMITY N/A 09/05/2017   Procedure: ABDOMINAL AORTOGRAM W/LOWER EXTREMITY;  Surgeon: Maeola Harman, MD;  Location[facility] INVASIVE CV LAB;  Service: Cardiovascular;  Laterality: N/A;  . AMPUTATION Left 10/09/2017   Procedure: AMPUTATION DIGIT LEFT FIFTH TOE;  Surgeon: Maeola Harman, MD;  Location: [facility] OR;  Service: Vascular;  Laterality: Left;  . AMPUTATION Left 12/14/2017   Procedure: LEFT TRANSMETATARSAL AMPUTATION;  Surgeon: Nadara Mustard, MD;  Location: [facility] OR;  Service: Orthopedics;  Laterality: Left;  . AMPUTATION Left 01/04/2018   Procedure: LEFT BELOW KNEE AMPUTATION;  Surgeon: Nadara Mustard, MD;  Location: [facility] OR;  Service: Orthopedics;  Laterality: Left;  . AMPUTATION TOE Left 10/09/2017   left foot 5th toe  . CORONARY ANGIOPLASTY WITH STENT PLACEMENT  01/2016; 03/2016   "1 + 1" (08/14/2017)  . IR FLUORO GUIDE CV LINE RIGHT  01/07/2018  . IR US GUIDE VASC ACCESS RIGHT  01/07/2018  . LOWER EXTREMITY ANGIOGRAPHY N/A 08/20/2017   Procedure: LOWER EXTREMITY ANGIOGRAPHY-rt leg;  Surgeon: Sherren Kerns, MD;  Location: [facility] INVASIVE CV LAB;  Service: Cardiovascular;  Laterality: N/A;  . PERIPHERAL VASCULAR ATHERECTOMY  08/14/2017   Procedure: PERIPHERAL VASCULAR ATHERECTOMY;  Surgeon: Nada Libman, MD;  Location: [facility] INVASIVE CV LAB;  Service: Cardiovascular;;  SFA   . PERIPHERAL VASCULAR ATHERECTOMY Left 09/05/2017   Procedure: PERIPHERAL VASCULAR ATHERECTOMY;  Surgeon: Maeola Harman, MD;  Location: [facility] INVASIVE CV  LAB;  Service: Cardiovascular;  Laterality: Left;  Tibioperoneal and posterior tibial  . PERIPHERAL VASCULAR BALLOON ANGIOPLASTY  08/14/2017   Procedure: PERIPHERAL VASCULAR BALLOON ANGIOPLASTY;  Surgeon: Nada Libman, MD;  Location: [facility] INVASIVE CV LAB;  Service: Cardiovascular;;  peroneal  . PERIPHERAL VASCULAR BALLOON ANGIOPLASTY  08/20/2017   Procedure: PERIPHERAL VASCULAR BALLOON ANGIOPLASTY;  Surgeon: Sherren Kerns, MD;  Location: [facility] INVASIVE CV LAB;  Service: Cardiovascular;;  . PERIPHERAL VASCULAR INTERVENTION  08/14/2017   Procedure: PERIPHERAL VASCULAR INTERVENTION;  Surgeon: Nada Libman, MD;  [facility]: [facility] INVAS[facility] CV LAB;  Service: Cardiovascular;;  SFA stent  . TONSILLECTOMY    . WOUND DEBRIDEMENT Left 11/06/2017   Procedure: DEBRIDEMENT WOUND TOE BED OF FIFTH TOE LEFT FOOT;  Surgeon: Maeola Harman, MD;  Location: [facility] OR;  Service: Vascular;  Laterality: Left;        Home Medications    Prior to Admission medications   Medication Sig Start Date End Date Taking? Authorizing Provider  acetaminophen (TYLENOL) 500 MG tablet Take 500 mg by mouth every 6 (six) hours as needed for mild pain or headache.     [provider]  amLODipine (NORVASC) 10 MG tablet Take 1 tablet (10 mg total) by mouth at bedtime. 01/23/18   Arnetha Courser, MD  aspirin EC 81 MG tablet Take 81 mg by mouth 2 (two) times daily.     [provider]  clopidogrel (PLAVIX) 75 MG tablet Take 1 tablet (75 mg total) by mouth daily with breakfast. 08/15/17   Rhyne, Ames Coupe, PA-C  ferrous sulfate 325 (65 FE) MG tablet Take 1 tablet (325 mg total) by mouth daily with breakfast. 01/24/18   Arnetha Courser, MD  gabapentin (NEURONTIN) 300 MG capsule Take 1 capsule (300 mg total) by mouth at bedtime. 01/23/18   Arnetha Courser, MD  hydrALAZINE (APRESOLINE) 100 MG tablet Take 1 tablet (100 mg total) by mouth every 8 (eight) hours. 01/23/18   Arnetha Courser, MD  hydrocortisone cream 1 % Apply 1  application topically 3 (three) times daily as needed for itching (minor skin irritation). 01/23/18   Arnetha Courser, MD  insulin lispro (ADMELOG SOLOSTAR) 100 UNIT/ML KiwkPen Inject 0-10 Units into the skin 3 (three) times daily before meals. Per sliding scale: CBG 0-150 0 units, 151-200 2 units, 201-250 4 units, 251-300 6 units, 301-350 8 units 351-400 10 units, call PCP if CBG <70 or >400    [provider]  metFORMIN (GLUCOPHAGE) 850 MG tablet Take 850 mg by mouth 2 (two) times daily with a meal.    [provider]  methocarbamol (ROBAXIN) 500 MG tablet Take 1 tablet (500 mg total) by mouth every 8 (eight) hours as needed for muscle spasms. 12/18/17   Nadara Mustard, MD  metoprolol tartrate (LOPRESSOR) 100 MG tablet Take 100 mg by mouth 2 (two) times daily.    [provider]  Multiple Vitamin (MULTIVITAMIN WITH MINERALS) TABS tablet Take 1 tablet by mouth at bedtime.    [provider]  nitroGLYCERIN (NITROSTAT) 0.4 MG SL tablet Place 0.4 mg under the tongue every 5 (five) minutes as needed for chest  pain.    [provider]  Nutritional Supplements (FEEDING SUPPLEMENT, NEPRO CARB STEADY,) LIQD Take 237 mLs by mouth daily. 01/23/18   Arnetha CourserAmin, Sumayya, MD  oxyCODONE-acetaminophen (PERCOCET) 10-325 MG tablet Take 1 tablet by mouth every 4 (four) hours as needed for pain. Patient taking differently: Take 1 tablet by mouth every 6 (six) hours as needed for pain.  12/18/17   Nadara Mustarduda, Marcus V, MD  polyethylene glycol [facility](MIRALAX / Ethelene HalGLYCOLAX) packet Take 17 g by mouth daily as needed for mild constipation. 01/23/18   Arnetha CourserAmin, Sumayya, MD  rosuvastatin (CRESTOR) 20 MG tablet Take 20 mg by mouth every evening.     [provider]  senna (SENOKOT) 8.6 MG TABS tablet Take 1 tablet by mouth at bedtime as needed (constipation).    [provider]  vitamin B-12 1000 MCG tablet Take 1 tablet (1,000 mcg total) by mouth daily. 01/24/18   Arnetha CourserAmin, Sumayya, MD    Family  History Family History  Problem Relation Age of Onset  . Heart failure Brother   . Heart disease Brother   . Kidney disease Mother   . Heart disease Father     Social History Social History   Tobacco Use  . Smoking status: Current Some Day Smoker    Packs/day: 0.25    Years: 43.00    Pack years: 10.75    Types: Cigarettes    Start date: 08/12/1974  . Smokeless tobacco: Never Used  . Tobacco comment: 2-3 cigarettes occasionally  Substance Use Topics  . Alcohol use: Not Currently    Comment: 08/14/2017 "nothing since ~ 2012"  . Drug use: No     Allergies   Patient has no known allergies.   Review of Systems Review of Systems  Constitutional: Negative for chills and fever.  Respiratory: Negative for shortness of breath.   Cardiovascular: Negative for chest pain.  Gastrointestinal: Negative for vomiting.  Musculoskeletal: Positive for myalgias (tender over left stump). Negative for back pain and neck pain.  Skin: Positive for wound (chronic dehiscense left stump).  Neurological: Negative for syncope, light-headedness and headaches.  Psychiatric/Behavioral: Negative for agitation.  All other systems reviewed and are negative.    Physical Exam Updated Vital Signs BP (!) 155/70 (BP Location: Left Arm)   Pulse 68   Temp 98 F (36.7 C) (Oral)   Resp 16   Ht 5\' 8"  (1.727 m)   Wt 86.2 kg   SpO2 100%   BMI 28.89 kg/m   Physical Exam  Constitutional: He appears well-developed and well-nourished. No distress.  HENT:  Head: Normocephalic and atraumatic.  Eyes: Right eye exhibits no discharge. Left eye exhibits no discharge.  Pulmonary/Chest: Effort normal. No respiratory distress.  Musculoskeletal:  Left BKA. Dehiscence with granulation tissue over the distal aspect of the stump. There is minimal separation with some blood draining. Mildly tender to palpation. No purulence, warmth or induration. See picture. Sensation to light touch intact.  Neurological: He is alert.  Coordination normal.  Skin: He is not diaphoretic.  Psychiatric: He has a normal mood and affect. His behavior is normal.  Nursing note and vitals reviewed.      ED Treatments / Results  Labs (all labs ordered are listed, but only abnormal results are displayed) Labs Reviewed - No data to display  EKG None  Radiology No results found.  Procedures Procedures (including critical care time)  Medications Ordered in ED Medications  oxyCODONE-acetaminophen (PERCOCET/ROXICET) 5-325 MG per tablet 1 tablet (1 tablet Oral Given 02/15/18  1330)     Initial Impression / Assessment and Plan / ED Course  I have reviewed the triage vital signs and the nursing notes.  Pertinent labs & imaging results that were available during my care of the patient were reviewed by me and considered in my medical decision making (see chart for details).     Patient with history of BKA presents to the ED from rehab facility after falling on his left stump earlier today. He is seen by Dr. Lajoyce Corners for chronic dehiscence of the wound since the operation 12/2017. On exam there is minimal bleeding over the wound which has some surrounding granulation tissue. No signs of infection. Patient's wound redressed in the ED. Have counseled him on wound care. Counseled him on return precautions and he agrees and appears reliable. He is scheduled for follow up with Dr. Lajoyce Corners next week. Discussed this patient with Dr. Rubin Payor who agrees with plan and discharge home.   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

## 2018-02-15 NOTE — ED Notes (Signed)
Called nursing home and gave report and plan of care for patient.

## 2018-02-15 NOTE — Discharge Instructions (Addendum)
Please keep wound dry, cleaned and covered with dressing. Change dressing twice a day.   Follow up with Dr. Lajoyce Cornersuda as scheduled next week.   Return to the ER if you have fever, chills, have rapid bleeding, have worsening pain or pus draining from the incision.

## 2018-02-15 NOTE — ED Triage Notes (Signed)
Pt from [facility] via EMS; during PT today, pt fell on to new BKA, which opened the sutures; compression bandage applied, no bleeding noted  152/72 P 68 RR 18

## 2018-02-21 ENCOUNTER — Ambulatory Visit (INDEPENDENT_AMBULATORY_CARE_PROVIDER_SITE_OTHER): Payer: Self-pay | Admitting: Orthopedic Surgery

## 2018-02-21 ENCOUNTER — Encounter (INDEPENDENT_AMBULATORY_CARE_PROVIDER_SITE_OTHER): Payer: Self-pay | Admitting: Orthopedic Surgery

## 2018-02-21 DIAGNOSIS — T8781 Dehiscence of amputation stump: Secondary | ICD-10-CM

## 2018-02-21 MED ORDER — DOXYCYCLINE HYCLATE 100 MG PO TABS: 100.0000 mg | ORAL_TABLET | Freq: Two times a day (BID) | ORAL | 0 refills | Status: AC

## 2018-02-21 NOTE — Progress Notes (Signed)
Office Visit Note   Patient: Joel Hebert           Date of Birth: [birth year]           MRN: [id number] Visit Date: 02/21/2018              Requested by: No referring provider defined for this encounter. PCP: Patient, No Pcp Per  Chief Complaint  Patient presents with  . Left Leg - Follow-up      HPI: Patient is a 62 year old gentleman who is 1/2 months out his left transtibial amputation.  Patient states he fell and went to the emergency room on 02/15/2018.  He has discontinued his stump shrinker is been using Ace and 4 x 4 at skilled nursing.  Assessment & Plan: Visit Diagnoses:  1. Dehiscence of amputation stump (HCC)     Plan: Continue with Dial soap cleansing daily of the wound 4 x 4's and Ace wrap he is given a prescription for doxycycline and follow-up in 2 weeks at which time will resume the stump shrinker.  Follow-Up Instructions: Return in about 2 weeks (around 03/07/2018).   Ortho Exam  Patient is alert, oriented, no adenopathy, well-dressed, normal affect, normal respiratory effort. Examination patient has some eschar laterally on the residual limb there is a small wound in the mid aspect of the wound which is superficial and this is about 1 cm diameter medially there is a dehiscence of the wound with clear serosanguineous drainage no purulence no cellulitis no odor.  Wound is 2mm x 2 cm and has a draining resolving hematoma.  Imaging: No results found. No images are attached to the encounter.  Labs: Lab Results  Component Value Date   HGBA1C 7.2 (H) 12/14/2017   HGBA1C 7.0 (H) 10/09/2017   REPTSTATUS 01/12/2018 FINAL 01/07/2018   CULT  01/07/2018    NO GROWTH 5 DAYS Performed at [facility], [address], [place], [place] [address]      Lab Results  Component Value Date   ALBUMIN 2.4 (L) 01/23/2018   ALBUMIN 2.4 (L) 01/22/2018   ALBUMIN 2.6 (L) 01/21/2018    There is no height or weight on file to calculate BMI.  Orders:  No  orders of the defined types were placed in this encounter.  Meds ordered this encounter  Medications  . doxycycline (VIBRA-TABS) 100 MG tablet    Sig: Take 1 tablet (100 mg total) by mouth 2 (two) times daily.    Dispense:  30 tablet    Refill:  0     Procedures: No procedures performed  Clinical Data: No additional findings.  ROS:  All other systems negative, except as noted in the HPI. Review of Systems  Objective: Vital Signs: There were no vitals taken for this visit.  Specialty Comments:  No specialty comments available.  PMFS History: Patient Active Problem List   Diagnosis Date Noted  . Chest pain 02/02/2018  . Symptomatic anemia 02/02/2018  . Anemia   . Uremia due to inadequate renal perfusion   . Acute renal failure (HCC)   . Sepsis (HCC) 01/03/2018  . Dehiscence of amputation stump (HCC) 12/27/2017  . History of transmetatarsal amputation of left foot (HCC) 12/14/2017  . Gangrene of left foot (HCC)   . Atherosclerosis of native artery of left lower extremity with gangrene (HCC) 11/19/2017  . Peripheral artery disease (HCC) 11/08/2017  . PAD (peripheral artery disease) (HCC) 08/14/2017   Past Medical History:  Diagnosis Date  .  Coronary artery disease   . High cholesterol   . Hypertension   . MI (myocardial infarction) (HCC) 08/13/2014; 01/2016- 03/2016 X 3  . PAD (peripheral artery disease) (HCC)   . Peripheral vascular disease (HCC)   . Type II diabetes mellitus (HCC)     Family History  Problem Relation Age of Onset  . Heart failure Brother   . Heart disease Brother   . Kidney disease Mother   . Heart disease Father     Past Surgical History:  Procedure Laterality Date  . ABDOMINAL AORTOGRAM W/LOWER EXTREMITY  08/14/2017  . ABDOMINAL AORTOGRAM W/LOWER EXTREMITY N/A 08/14/2017   Procedure: ABDOMINAL AORTOGRAM W/LOWER EXTREMITY;  Surgeon: Nada LibmanBrabham, Vance W, MD;  [facility]: [facility] INVASIVE CV LAB;  Service: Cardiovascular;  Laterality: N/A;  . ABDOMINAL  AORTOGRAM W/LOWER EXTREMITY N/A 09/05/2017   Procedure: ABDOMINAL AORTOGRAM W/LOWER EXTREMITY;  Surgeon: Maeola Harmanain, Brandon Christopher, MD;  [facility];  Service: Cardiovascular;  Laterality: N/A;  . AMPUTATION Left 10/09/2017   Procedure: AMPUTATION DIGIT LEFT FIFTH TOE;  Surgeon: Maeola Harmanain, Brandon Christopher, MD;  [facility];  Service: Vascular;  Laterality: Left;  . AMPUTATION Left 12/14/2017   Procedure: LEFT TRANSMETATARSAL AMPUTATION;  Surgeon: Nadara Mustarduda, Andri Prestia V, MD;  [facility];  Service: Orthopedics;  Laterality: Left;  . AMPUTATION Left 01/04/2018   Procedure: LEFT BELOW KNEE AMPUTATION;  Surgeon: Nadara Mustarduda, Ronette Hank V, MD;  [facility];  Service: Orthopedics;  Laterality: Left;  . AMPUTATION TOE Left 10/09/2017   left foot 5th toe  . CORONARY ANGIOPLASTY WITH STENT PLACEMENT  01/2016; 03/2016   "1 + 1" (08/14/2017)  . IR FLUORO GUIDE CV LINE RIGHT  01/07/2018  . IR US GUIDE VASC ACCESS RIGHT  01/07/2018  . LOWER EXTREMITY ANGIOGRAPHY N/A 08/20/2017   Procedure: LOWER EXTREMITY ANGIOGRAPHY-rt leg;  Surgeon: Sherren KernsFields, Charles E, MD;  [facility];  Service: Cardiovascular;  Laterality: N/A;  . PERIPHERAL VASCULAR ATHERECTOMY  08/14/2017   Procedure: PERIPHERAL VASCULAR ATHERECTOMY;  Surgeon: Nada LibmanBrabham, Vance W, MD;  [facility]: [facility] LAB;  Service: Cardiovascular;;  SFA   . PERIPHERAL VASCULAR ATHERECTOMY Left 09/05/2017   Procedure: PERIPHERAL VASCULAR ATHERECTOMY;  Surgeon: Maeola Harmanain, Brandon Christopher, MD;  [facility];  Service: Cardiovascular;  Laterality: Left;  Tibioperoneal and posterior tibial  . PERIPHERAL VASCULAR BALLOON ANGIOPLASTY  08/14/2017   Procedure: PERIPHERAL VASCULAR BALLOON ANGIOPLASTY;  Surgeon: Nada LibmanBrabham, Vance W, MD;  [facility]: [facility] LAB;  Service: Cardiovascular;;  peroneal  . PERIPHERAL VASCULAR BALLOON ANGIOPLASTY  08/20/2017   Procedure: PERIPHERAL VASCULAR BALLOON ANGIOPLASTY;  Surgeon: Sherren KernsFields, Charles E, MD;  Location:  [facility] LAB;  Service: Cardiovascular;;  . PERIPHERAL VASCULAR INTERVENTION  08/14/2017   Procedure: PERIPHERAL VASCULAR INTERVENTION;  Surgeon: Nada LibmanBrabham, Vance W, MD;  [facility]: [facility] LAB;  Service: Cardiovascular;;  SFA stent  . TONSILLECTOMY    . WOUND DEBRIDEMENT Left 11/06/2017   Procedure: DEBRIDEMENT WOUND TOE BED OF FIFTH TOE LEFT FOOT;  Surgeon: Maeola Harmanain, Brandon Christopher, MD;  [facility];  Service: Vascular;  Laterality: Left;   Social History   Occupational History  . Not on file  Tobacco Use  . Smoking status: Current Some Day Smoker    Packs/day: 0.25    Years: 43.00    Pack years: 10.75    Types: Cigarettes    Start date: 08/12/1974  . Smokeless tobacco: Never Used  . Tobacco comment: 2-3 cigarettes occasionally  Substance and Sexual Activity  .  Alcohol use: Not Currently    Comment: 08/14/2017 "nothing since ~ 2012"  . Drug use: No  . Sexual activity: Not Currently

## 2018-03-07 ENCOUNTER — Ambulatory Visit (INDEPENDENT_AMBULATORY_CARE_PROVIDER_SITE_OTHER): Payer: Medicaid Other | Admitting: Physician Assistant

## 2018-03-07 ENCOUNTER — Encounter (INDEPENDENT_AMBULATORY_CARE_PROVIDER_SITE_OTHER): Payer: Self-pay | Admitting: Orthopedic Surgery

## 2018-03-07 DIAGNOSIS — Z89512 Acquired absence of left leg below knee: Secondary | ICD-10-CM

## 2018-03-07 NOTE — Progress Notes (Signed)
Office Visit Note   Patient: Joel Hebert           Date of Birth: 08/03/1955           MRN: [id number] Visit Date: 03/07/2018              Requested by: No referring provider defined for this encounter. PCP: Patient, No Pcp Per   Assessment & Plan: Visit Diagnoses:  1. S/P BKA (below knee amputation) unilateral, left (HCC)     Plan: Going to apply Silvadene cream to the incisional area daily cover with gauze and Ace wrap.  He will follow-up in 2 weeks to reassess the incisional area.  Plan follow-up with biotech once the incisional area is improved for prosthesis fitting.  Follow-Up Instructions: No follow-ups on file.   Orders:  No orders of the defined types were placed in this encounter.  No orders of the defined types were placed in this encounter.     Procedures: No procedures performed   Clinical Data: No additional findings.   Subjective: Chief Complaint  Patient presents with  . Left Knee - Wound Check    HPI Patient is a 62 year old male who presents for 2 months postop follow-up visit for left transtibial amputation.  He reports that there is been minimal pain.  He reports he still has a small open area and some minimal drainage.  No signs or symptoms of infection. Review of Systems   Objective: Vital Signs: There were no vitals taken for this visit.  Physical Exam  Patient is well-nourished well-developed and in no apparent distress.  He is appropriately groomed.  Ortho Exam Examination of the left transtibial amputation shows some crusting and minimal wound about the distal transtibial amputation incision.  There is scant drainage which is serous appearing.  There is very little edema.  No signs of cellulitis. Specialty Comments:  No specialty comments available.  Imaging: No results found.   PMFS History: Patient Active Problem List   Diagnosis Date Noted  . Chest pain 02/02/2018  . Symptomatic anemia 02/02/2018  . Anemia   .  Uremia due to inadequate renal perfusion   . Acute renal failure (HCC)   . Sepsis (HCC) 01/03/2018  . Dehiscence of amputation stump (HCC) 12/27/2017  . History of transmetatarsal amputation of left foot (HCC) 12/14/2017  . Gangrene of left foot (HCC)   . Atherosclerosis of native artery of left lower extremity with gangrene (HCC) 11/19/2017  . Peripheral artery disease (HCC) 11/08/2017  . PAD (peripheral artery disease) (HCC) 08/14/2017   Past Medical History:  Diagnosis Date  . Coronary artery disease   . High cholesterol   . Hypertension   . MI (myocardial infarction) (HCC) 08/13/2014; 01/2016- 03/2016 X 3  . PAD (peripheral artery disease) (HCC)   . Peripheral vascular disease (HCC)   . Type II diabetes mellitus (HCC)     Family History  Problem Relation Age of Onset  . Heart failure Brother   . Heart disease Brother   . Kidney disease Mother   . Heart disease Father     Past Surgical History:  Procedure Laterality Date  . ABDOMINAL AORTOGRAM W/LOWER EXTREMITY  08/14/2017  . ABDOMINAL AORTOGRAM W/LOWER EXTREMITY N/A 08/14/2017   Procedure: ABDOMINAL AORTOGRAM W/LOWER EXTREMITY;  Surgeon: Nada LibmanBrabham, Vance W, MD;  [facility];  Service: Cardiovascular;  Laterality: N/A;  . ABDOMINAL AORTOGRAM W/LOWER EXTREMITY N/A 09/05/2017   Procedure: ABDOMINAL AORTOGRAM W/LOWER EXTREMITY;  Surgeon: Randie Heinzain,  Dennard Schaumann, MD;  Location: [facility] INVASIVE CV LAB;  Service: Cardiovascular;  Laterality: N/A;  . AMPUTATION Left 10/09/2017   Procedure: AMPUTATION DIGIT LEFT FIFTH TOE;  Surgeon: Maeola Harman, MD;  Location: [facility] OR;  Service: Vascular;  Laterality: Left;  . AMPUTATION Left 12/14/2017   Procedure: LEFT TRANSMETATARSAL AMPUTATION;  Surgeon: Nadara Mustard, MD;  Location: [facility] OR;  Service: Orthopedics;  Laterality: Left;  . AMPUTATION Left 01/04/2018   Procedure: LEFT BELOW KNEE AMPUTATION;  Surgeon: Nadara Mustard, MD;  Location: [facility] OR;  Service: Orthopedics;   Laterality: Left;  . AMPUTATION TOE Left 10/09/2017   left foot 5th toe  . CORONARY ANGIOPLASTY WITH STENT PLACEMENT  01/2016; 03/2016   "1 + 1" (08/14/2017)  . IR FLUORO GUIDE CV LINE RIGHT  01/07/2018  . IR US GUIDE VASC ACCESS RIGHT  01/07/2018  . LOWER EXTREMITY ANGIOGRAPHY N/A 08/20/2017   Procedure: LOWER EXTREMITY ANGIOGRAPHY-rt leg;  Surgeon: Sherren Kerns, MD;  Location: [facility] INVASIVE CV LAB;  Service: Cardiovascular;  Laterality: N/A;  . PERIPHERAL VASCULAR ATHERECTOMY  08/14/2017   Procedure: PERIPHERAL VASCULAR ATHERECTOMY;  Surgeon: Nada Libman, MD;  Location: [facility] INVASIVE [facility] LAB;  Service: Cardiovascular;;  SFA   . PERIPHERAL VASCULAR ATHERECTOMY Left 09/05/2017   Procedure: PERIPHERAL VASCULAR ATHERECTOMY;  Surgeon: Maeola Harman, MD;  Location: [facility] INVASIVE CV LAB;  Service: Cardiovascular;  Laterality: Left;  Tibioperoneal and posterior tibial  . PERIPHERAL VASCULAR BALLOON ANGIOPLASTY  08/14/2017   Procedure: PERIPHERAL VASCULAR BALLOON ANGIOPLASTY;  Surgeon: Nada Libman, MD;  Location: [facility] INVAS[facility] LAB;  Service: Cardiovascular;;  peroneal  . PERIPHERAL VASCULAR BALLOON ANGIOPLASTY  08/20/2017   Procedure: PERIPHERAL VASCULAR BALLOON ANGIOPLASTY;  Surgeon: Sherren Kerns, MD;  Location: [facility];  Service: Cardiovascular;;  . PERIPHERAL VASCULAR INTERVENTION  08/14/2017   Procedure: PERIPHERAL VASCULAR INTERVENTION;  Surgeon: Nada Libman, MD;  Location: [facility];  Service: Cardiovascular;;  SFA stent  . TONSILLECTOMY    . WOUND DEBRIDEMENT Left 11/06/2017   Procedure: DEBRIDEMENT WOUND TOE BED OF FIFTH TOE LEFT FOOT;  Surgeon: Maeola Harman, MD;  Location: [facility] OR;  Service: Vascular;  Laterality: Left;   Social History   Occupational History  . Not on file  Tobacco Use  . Smoking status: Current Some Day Smoker    Packs/day: 0.25    Years: 43.00    Pack years: 10.75    Types: Cigarettes    Start date: 08/12/1974  .  Smokeless tobacco: Never Used  . Tobacco comment: 2-3 cigarettes occasionally  Substance and Sexual Activity  . Alcohol use: Not Currently    Comment: 08/14/2017 "nothing since ~ 2012"  . Drug use: No  . Sexual activity: Not Currently

## 2018-03-14 ENCOUNTER — Other Ambulatory Visit (HOSPITAL_COMMUNITY): Payer: Self-pay

## 2018-03-15 ENCOUNTER — Ambulatory Visit (HOSPITAL_COMMUNITY)
Admission: RE | Admit: 2018-03-15 | Discharge: 2018-03-15 | Disposition: A | Discharge status: Home / Self Care | Payer: Medicaid Other | Source: Ambulatory Visit | Attending: Nephrology | Admitting: Nephrology

## 2018-03-15 DIAGNOSIS — N189 Chronic kidney disease, unspecified: Secondary | ICD-10-CM | POA: Diagnosis present

## 2018-03-15 DIAGNOSIS — D631 Anemia in chronic kidney disease: Secondary | ICD-10-CM | POA: Insufficient documentation

## 2018-03-15 LAB — PREPARE RBC (CROSSMATCH)

## 2018-03-15 MED ORDER — SODIUM CHLORIDE 0.9% IV SOLUTION: Freq: Once | INTRAVENOUS | Status: DC

## 2018-03-16 LAB — BPAM RBC
BLOOD PRODUCT EXPIRATION DATE: 201909132359
BLOOD PRODUCT EXPIRATION DATE: 201910052359
ISSUE DATE / TIME: 201909061227
ISSUE DATE / TIME: 201909060923
UNIT TYPE AND RH: 5100
UNIT TYPE AND RH: 5100

## 2018-03-16 LAB — TYPE AND SCREEN
ABO/RH(D): O POS
Antibody Screen: NEGATIVE
UNIT DIVISION: 0
Unit division: 0

## 2018-03-21 ENCOUNTER — Encounter (INDEPENDENT_AMBULATORY_CARE_PROVIDER_SITE_OTHER): Payer: Self-pay | Admitting: Physician Assistant

## 2018-03-21 ENCOUNTER — Ambulatory Visit (INDEPENDENT_AMBULATORY_CARE_PROVIDER_SITE_OTHER): Payer: Medicaid Other | Admitting: Physician Assistant

## 2018-03-21 VITALS — Ht 68.0 in | Wt 191.0 lb

## 2018-03-21 DIAGNOSIS — Z89512 Acquired absence of left leg below knee: Secondary | ICD-10-CM

## 2018-03-21 DIAGNOSIS — I739 Peripheral vascular disease, unspecified: Secondary | ICD-10-CM

## 2018-03-21 NOTE — Progress Notes (Signed)
Office Visit Note   Patient: Joel Hebert           Date of Birth: 10/08/1955           MRN: [id number] Visit Date: 03/21/2018              Requested by: No referring provider defined for this encounter. PCP: Patient, No Pcp Per  No chief complaint on file.     HPI: Patient is a 62 year old male who presents 2-1/2 months postop follow-up visit for left transtibial amputation.  At last visit he had some incisional crusting and slight dehiscence and we started some Silvadene cream to the area for a couple of weeks. The patient reports he fell on the distal stump about a month ago and had a dehiscence which is been slowly healing.  He still is having some crusting about the distal incision line.  He reports no pain.  He is completing a course of doxycycline.  He is on Plavix for anticoagulation.  He is still smoking but reports he is trying to cut back.  He reports doing well with therapies at a [facility].  He is going to be leaving the skilled nursing facility and[place] and would like some information about where to get a prosthesis made in [place].    Assessment & Plan: Visit Diagnoses:  1. S/P BKA (below knee amputation) unilateral, left (HCC)   2. Peripheral artery disease (HCC)     Plan: The necrotic eschar over the distal incision was debrided sharply after informed consent with a 10 blade and the patient tolerated this well.  He was deb told, [employer] prosthetics rided to some inhaled tissue and bleeding pink tissue or the majority of the incision line.  He is can continue to apply some Silvadene over this area and continue to cover with gauze and wrap with an Ace wrap and then his stump shrinker.  He was given information for a K2 prosthesis for limited community ambulation with the ability to transfers low level environmental barriers such as curbs stairs or minimally uneven surfaces.  He we gave him 2 recommendations of places to go,[facility] and [facility] [facility] both in [place].  He will follow-up here in 3 months.  Follow-Up Instructions: No follow-ups on file.   Ortho Exam  Patient is alert, oriented, no adenopathy, well-dressed, normal affect, normal respiratory effort. Patient presents with a wheelchair level.  His left BKA distal incision has dark eschar present and this was sharply debrided to either healed or pink bleeding tissue without difficulty.  There are no signs of cellulitis.  There is minimal edema.  He has full knee extension and flexion.  Imaging: No results found. No images are attached to the encounter.  Labs: Lab Results  Component Value Date   HGBA1C 7.2 (H) 12/14/2017   HGBA1C 7.0 (H) 10/09/2017   REPTSTATUS 01/12/2018 FINAL 01/07/2018   CULT  01/07/2018    NO GROWTH 5 DAYS Performed at [facility], [address] [place], [place] [address]      Lab Results  Component Value Date   ALBUMIN 2.4 (L) 01/23/2018   ALBUMIN 2.4 (L) 01/22/2018   ALBUMIN 2.6 (L) 01/21/2018    There is no height or weight on file to calculate BMI.  Orders:  No orders of the defined types were placed in this encounter.  No orders of the defined types were placed in this encounter.  Procedures: No procedures performed  Clinical Data: No additional findings.  ROS:  All other systems negative, except as noted in the HPI. Review of Systems  Objective: Vital Signs: There were no vitals taken for this visit.  Specialty Comments:  No specialty comments available.  PMFS History: Patient Active Problem List   Diagnosis Date Noted  . Chest pain 02/02/2018  . Symptomatic anemia 02/02/2018  . Anemia   . Uremia due to inadequate renal perfusion   . Acute renal failure (HCC)   . Sepsis (HCC) 01/03/2018  . Dehiscence of amputation stump (HCC) 12/27/2017  . History of transmetatarsal amputation of left foot (HCC) 12/14/2017  . Gangrene of left foot (HCC)   .  Atherosclerosis of native artery of left lower extremity with gangrene (HCC) 11/19/2017  . Peripheral artery disease (HCC) 11/08/2017  . PAD (peripheral artery disease) (HCC) 08/14/2017   Past Medical History:  Diagnosis Date  . Coronary artery disease   . High cholesterol   . Hypertension   . MI (myocardial infarction) (HCC) 08/13/2014; 01/2016- 03/2016 X 3  . PAD (peripheral artery disease) (HCC)   . Peripheral vascular disease (HCC)   . Type II diabetes mellitus (HCC)     Family History  Problem Relation Age of Onset  . Heart failure Brother   . Heart disease Brother   . Kidney disease Mother   . Heart disease Father     Past Surgical History:  Procedure Laterality Date  . ABDOMINAL AORTOGRAM W/LOWER EXTREMITY  08/14/2017  . ABDOMINAL AORTOGRAM W/LOWER EXTREMITY N/A 08/14/2017   Procedure: ABDOMINAL AORTOGRAM W/LOWER EXTREMITY;  Surgeon: Nada Libman, MD;  [facility]: [facility] LAB;  Service: Cardiovascular;  Laterality: N/A;  . ABDOMINAL AORTOGRAM W/LOWER EXTREMITY N/A 09/05/2017   Procedure: ABDOMINAL AORTOGRAM W/LOWER EXTREMITY;  Surgeon: Maeola Harman, MD;  Location: [facility] INVASIVE CV LAB;  Service: Cardiovascular;  Laterality: N/A;  . AMPUTATION Left 10/09/2017   Procedure: AMPUTATION DIGIT LEFT FIFTH TOE;  Surgeon: Maeola Harman, MD;  Location: [facility] OR;  Service: Vascular;  Laterality: Left;  . AMPUTATION Left 12/14/2017   Procedure: LEFT TRANSMETATARSAL AMPUTATION;  Surgeon: Nadara Mustard, MD;  Location: [facility] OR;  Service: Orthopedics;  Laterality: Left;  . AMPUTATION Left 01/04/2018   Procedure: LEFT BELOW KNEE AMPUTATION;  Surgeon: Nadara Mustard, MD;  Location: [facility] OR;  Service: Orthopedics;  Laterality: Left;  . AMPUTATION TOE Left 10/09/2017   left foot 5th toe  . CORONARY ANGIOPLASTY WITH STENT PLACEMENT  01/2016; 03/2016   "1 + 1" (08/14/2017)  . IR FLUORO GUIDE CV LINE RIGHT  01/07/2018  . IR US GUIDE VASC ACCESS RIGHT  01/07/2018  . LOWER EXTREMITY  ANGIOGRAPHY N/A 08/20/2017   Procedure: LOWER EXTREMITY ANGIOGRAPHY-rt leg;  Surgeon: Sherren Kerns, MD;  Location: [facility] INVASIVE CV LAB;  Service: Cardiovascular;  Laterality: N/A;  . PERIPHERAL VASCULAR ATHERECTOMY  08/14/2017   Procedure: PERIPHERAL VASCULAR ATHERECTOMY;  Surgeon: Nada Libman, MD;  Location: [facility];  Service: Cardiovascular;;  SFA   . PERIPHERAL VASCULAR ATHERECTOMY Left 09/05/2017   Procedure: PERIPHERAL VASCULAR ATHERECTOMY;  Surgeon: Maeola Harman, MD;  Location: [facility] INVASIVE CV LAB;  Service: Cardiovascular;  Laterality: Left;  Tibioperoneal and posterior tibial  . PERIPHERAL VASCULAR BALLOON ANGIOPLASTY  08/14/2017   Procedure: PERIPHERAL VASCULAR BALLOON ANGIOPLASTY;  Surgeon: Nada Libman, MD;  [facility]: [facility];  Service: Cardiovascular;;  peroneal  . PERIPHERAL VASCULAR BALLOON ANGIOPLASTY  08/20/2017  Procedure: PERIPHERAL VASCULAR BALLOON ANGIOPLASTY;  Surgeon: Sherren Kerns, MD;  Location: [facility];  Service: Cardiovascular;;  . PERIPHERAL VASCULAR INTERVENTION  08/14/2017   Procedure: PERIPHERAL VASCULAR INTERVENTION;  Surgeon: Nada Libman, MD;  [facility]: [facility];  Service: Cardiovascular;;  SFA stent  . TONSILLECTOMY    . WOUND DEBRIDEMENT Left 11/06/2017   Procedure: DEBRIDEMENT WOUND TOE BED OF FIFTH TOE LEFT FOOT;  Surgeon: Maeola Harman, MD;  Location: [facility] OR;  Service: Vascular;  Laterality: Left;   Social History   Occupational History  . Not on file  Tobacco Use  . Smoking status: Current Some Day Smoker    Packs/day: 0.25    Years: 43.00    Pack years: 10.75    Types: Cigarettes    Start date: 08/12/1974  . Smokeless tobacco: Never Used  . Tobacco comment: 2-3 cigarettes occasionally  Substance and Sexual Activity  . Alcohol use: Not Currently    Comment: 08/14/2017 "nothing since ~ 2012"  . Drug use: No  . Sexual activity: Not Currently

## 2018-03-27 ENCOUNTER — Other Ambulatory Visit: Payer: Self-pay

## 2018-03-27 ENCOUNTER — Encounter (HOSPITAL_COMMUNITY): Payer: Self-pay | Admitting: *Deleted

## 2018-03-27 MED FILL — PEG-3350 SOLUTION: 420 | 1 days supply | Qty: 4000 | Fill #0

## 2018-03-27 NOTE — Progress Notes (Signed)
CALLED [facility] AT [place] NO ANSWER

## 2018-03-27 NOTE — Progress Notes (Addendum)
FA[facility] ALL PRE [facility]CTIONS [facility] Valinda HoarFAX [phone], MEDICAL HISTORY REVIEWED WITH PATIENT BY PHONE EARLIER TODAY, PATIENT ALERT AND ORIENTED, SIGNS ALL CONSENTS.

## 2018-03-27 NOTE — Progress Notes (Signed)
Called [facility] at [place] back due to no fax received on hold x 20 minutes to speak with [facility] nurse.

## 2018-03-27 NOTE — Progress Notes (Signed)
Preop instructions for: Joel Hebert      Date of Birth   1956/04/08                         Date of Procedure:  03-28-18     Doctor: dr  Levora Angelbrahmbhatt [facility]: 700 am Report to: Admitting  Procedure: colonscopy with propofol, egd with profofol Any procedure time changes, MD office will notify you!   Do not eat or drink past midnight the night before your procedure.(To include any tube feedings-must be discontinued) Reminder:Follow bowel prep instructions per MD office!   Take these morning medications only with sips of water. HYDRAZALINE (APRESOLINE) IF TAKES IN AM, CLONIDINE IF TAKES IN AM, METORPOLOL TARTRATE IF TAKES IN AM, OXYCODONE IF NEEDED  Note: No Insulin or Diabetic meds should be given or taken the morning of the procedure!   Facility contact:   [facility]                Phone:  [phone]    UJWFZX [phone]              Health Care AOZ:[id number]:PATIENT SIGNS OWN CONSENTS PER PATIENT  Transportation contact phone#: [facility] [place] TO PROVIDE TRANSPORTATION  Please send day of procedure:current med list and meds last taken that day, confirm nothing by mouth status from what time, Patient Demographic info( to include DNR status, problem list, allergies)    Bring Insurance card and picture ID Leave all jewelry and other valuables at place where living( no metal or rings to be worn) No contact lens Women-no make-up, no lotions,perfumes,powders Men-no colognes,lotions  Any questions day of procedure,call Endoscopy unit-[phone]!   Sent from :[facility]                   Phone:[phone]                   Fax:[phone]  Sent by : Cain SieveSHARON Shareece Bultman RN

## 2018-03-27 NOTE — Progress Notes (Signed)
[place] [facility] [place] x 10 minutes no answer from nurse [facility]

## 2018-03-27 NOTE — Progress Notes (Addendum)
Requested last h and p note,  copy of medication record and any labs with in last 30 days. Spoke with [facility] nurse for patient.

## 2018-03-28 ENCOUNTER — Ambulatory Visit (HOSPITAL_COMMUNITY)
Admission: RE | Admit: 2018-03-28 | Discharge: 2018-03-28 | Disposition: A | Discharge status: Home / Self Care | Payer: Medicaid Other | Source: Ambulatory Visit | Attending: Gastroenterology | Admitting: Gastroenterology

## 2018-03-28 ENCOUNTER — Encounter (HOSPITAL_COMMUNITY)
Admission: RE | Disposition: A | Discharge status: Home / Self Care | Payer: Self-pay | Source: Ambulatory Visit | Attending: Gastroenterology

## 2018-03-28 DIAGNOSIS — Z5309 Procedure and treatment not carried out because of other contraindication: Secondary | ICD-10-CM | POA: Insufficient documentation

## 2018-03-28 SURGERY — CANCELLED PROCEDURE

## 2018-03-28 MED ORDER — SODIUM CHLORIDE 0.9 % IV SOLN: INTRAVENOUS | Status: DC

## 2018-03-28 MED ORDER — PROPOFOL 10 MG/ML IV BOLUS
INTRAVENOUS | Status: AC
  Filled 2018-03-28: qty 40

## 2018-03-28 SURGICAL SUPPLY — 25 items

## 2018-03-28 NOTE — Progress Notes (Signed)
Patient came in today for a colonoscopy with Dr. Levora AngelBrahmbhatt. During admitting process, patient states that he drank approx. 3/4 of prep. Furthermore states that he ate breakfast, lunch, and a hotdog with chips for dinner. Dr. Levora AngelBrahmbhatt notified and he advised that we cancel him for today.

## 2018-03-28 NOTE — H&P (Signed)
Patient's procedure was canceled because he did not follow the instructions and did not completed the prep.We will contact patient to reschedule the procedure.   Kathi DerParag Merlinda Wrubel MD, FACP 03/28/2018, 10:48 AM  Contact #  [phone]

## 2018-04-25 ENCOUNTER — Encounter (INDEPENDENT_AMBULATORY_CARE_PROVIDER_SITE_OTHER): Payer: Self-pay | Admitting: Physician Assistant

## 2018-04-25 ENCOUNTER — Ambulatory Visit (INDEPENDENT_AMBULATORY_CARE_PROVIDER_SITE_OTHER): Payer: Medicaid Other | Admitting: Physician Assistant

## 2018-04-25 VITALS — Ht 68.0 in | Wt 191.0 lb

## 2018-04-25 DIAGNOSIS — Z89512 Acquired absence of left leg below knee: Secondary | ICD-10-CM

## 2018-04-26 ENCOUNTER — Encounter (INDEPENDENT_AMBULATORY_CARE_PROVIDER_SITE_OTHER): Payer: Self-pay | Admitting: Physician Assistant

## 2018-04-26 NOTE — Progress Notes (Signed)
Office Visit Note   Patient: Joel Hebert           Date of Birth: July 14, 1955           MRN: [id number] Visit Date: 04/25/2018              Requested by: No referring provider defined for this encounter. PCP: Patient, No Pcp Per  Chief Complaint  Patient presents with  . Left Leg - Follow-up      HPI: Patient is a 62 year old gentleman who presents in follow-up for left transtibial amputation he is 3-1/2 months out from surgery.  Patient has been using Silvadene dressing on the wound wearing a compression stocking.  Assessment & Plan: Visit Diagnoses:  1. S/P BKA (below knee amputation) unilateral, left (HCC)     Plan: Continue with wound care.  The importance of smoking cessation was discussed.  Continue with the compression strength care.  Patient was given a prescription for a K2 level prosthesis with the [employer] [facility] [facility] in [place].  Follow-Up Instructions: Return in about 2 weeks (around 05/09/2018).   Ortho Exam  Patient is alert, oriented, no adenopathy, well-dressed, normal affect, normal respiratory effort. Examination patient has some scab and eschar around the surgical incision there is no cellulitis no odor no drainage no signs of infection the residual limb is consolidating nicely.  Patient denies any pain.  His last hemoglobin A1c was 7.2 his last albumin was 2.4.  Imaging: No results found. No images are attached to the encounter.  Labs: Lab Results  Component Value Date   HGBA1C 7.2 (H) 12/14/2017   HGBA1C 7.0 (H) 10/09/2017   REPTSTATUS 01/12/2018 FINAL 01/07/2018   CULT  01/07/2018    NO GROWTH 5 DAYS Performed at [facility], [address], [place], [place] [address]      Lab Results  Component Value Date   ALBUMIN 2.4 (L) 01/23/2018   ALBUMIN 2.4 (L) 01/22/2018   ALBUMIN 2.6 (L) 01/21/2018    Body mass index is 29.04 kg/m.  Orders:  No orders of the defined types were placed in this  encounter.  No orders of the defined types were placed in this encounter.    Procedures: No procedures performed  Clinical Data: No additional findings.  ROS:  All other systems negative, except as noted in the HPI. Review of Systems  Objective: Vital Signs: Ht 5\' 8"  (1.727 m)   Wt 191 lb (86.6 kg)   BMI 29.04 kg/m   Specialty Comments:  No specialty comments available.  PMFS History: Patient Active Problem List   Diagnosis Date Noted  . Chest pain 02/02/2018  . Symptomatic anemia 02/02/2018  . Anemia   . Uremia due to inadequate renal perfusion   . Acute renal failure (HCC)   . Sepsis (HCC) 01/03/2018  . Dehiscence of amputation stump (HCC) 12/27/2017  . History of transmetatarsal amputation of left foot (HCC) 12/14/2017  . Gangrene of left foot (HCC)   . Atherosclerosis of native artery of left lower extremity with gangrene (HCC) 11/19/2017  . Peripheral artery disease (HCC) 11/08/2017  . PAD (peripheral artery disease) (HCC) 08/14/2017   Past Medical History:  Diagnosis Date  . Coronary artery disease   . High cholesterol   . Hypertension   . MI (myocardial infarction) (HCC) 08/13/2014; 01/2016- 03/2016 X 3  . PAD (peripheral artery disease) (HCC)   . Peripheral vascular disease (HCC)   . Type II diabetes mellitus (HCC)  Family History  Problem Relation Age of Onset  . Heart failure Brother   . Heart disease Brother   . Kidney disease Mother   . Heart disease Father     Past Surgical History:  Procedure Laterality Date  . ABDOMINAL AORTOGRAM W/LOWER EXTREMITY  08/14/2017  . ABDOMINAL AORTOGRAM W/LOWER EXTREMITY N/A 08/14/2017   Procedure: ABDOMINAL AORTOGRAM W/LOWER EXTREMITY;  Surgeon: Nada Libman, MD;  Location: [facility] INVASIVE CV LAB;  Service: Cardiovascular;  Laterality: N/A;  . ABDOMINAL AORTOGRAM W/LOWER EXTREMITY N/A 09/05/2017   Procedure: ABDOMINAL AORTOGRAM W/LOWER EXTREMITY;  Surgeon: Maeola Harman, MD;  Location: [facility] INVASIVE CV  LAB;  Service: Cardiovascular;  Laterality: N/A;  . AMPUTATION Left 10/09/2017   Procedure: AMPUTATION DIGIT LEFT FIFTH TOE;  Surgeon: Maeola Harman, MD;  Location: [facility] OR;  Service: Vascular;  Laterality: Left;  . AMPUTATION Left 12/14/2017   Procedure: LEFT TRANSMETATARSAL AMPUTATION;  Surgeon: Nadara Mustard, MD;  Location: [facility] OR;  Service: Orthopedics;  Laterality: Left;  . AMPUTATION Left 01/04/2018   Procedure: LEFT BELOW KNEE AMPUTATION;  Surgeon: Nadara Mustard, MD;  Location: [facility] OR;  Service: Orthopedics;  Laterality: Left;  . AMPUTATION TOE Left 10/09/2017   left foot 5th toe  . CORONARY ANGIOPLASTY WITH STENT PLACEMENT  01/2016; 03/2016   "1 + 1" (08/14/2017)  . IR FLUORO GUIDE CV LINE RIGHT  01/07/2018  . IR US GUIDE VASC ACCESS RIGHT  01/07/2018  . LOWER EXTREMITY ANGIOGRAPHY N/A 08/20/2017   Procedure: LOWER EXTREMITY ANGIOGRAPHY-rt leg;  Surgeon: Sherren Kerns, MD;  Location: [facility] INVASIVE CV LAB;  Service: Cardiovascular;  Laterality: N/A;  . PERIPHERAL VASCULAR ATHERECTOMY  08/14/2017   Procedure: PERIPHERAL VASCULAR ATHERECTOMY;  Surgeon: Nada Libman, MD;  Location: [facility] INVASIVE CV LAB;  Service: Cardiovascular;;  SFA   . PERIPHERAL VASCULAR ATHERECTOMY Left 09/05/2017   Procedure: PERIPHERAL VASCULAR ATHERECTOMY;  Surgeon: Maeola Harman, MD;  Location: [facility] INVASIVE CV LAB;  Service: Cardiovascular;  Laterality: Left;  Tibioperoneal and posterior tibial  . PERIPHERAL VASCULAR BALLOON ANGIOPLASTY  08/14/2017   Procedure: PERIPHERAL VASCULAR BALLOON ANGIOPLASTY;  Surgeon: Nada Libman, MD;  Location: [facility] INVASIVE CV LAB;  Service: Cardiovascular;;  peroneal  . PERIPHERAL VASCULAR BALLOON ANGIOPLASTY  08/20/2017   Procedure: PERIPHERAL VASCULAR BALLOON ANGIOPLASTY;  Surgeon: Sherren Kerns, MD;  Location: [facility] INVASIVE CV LAB;  Service: Cardiovascular;;  . PERIPHERAL VASCULAR INTERVENTION  08/14/2017   Procedure: PERIPHERAL VASCULAR INTERVENTION;  Surgeon: Nada Libman, MD;  Location: [facility] INVASIVE CV LAB;  Service: Cardiovascular;;  SFA stent  . TONSILLECTOMY    . WOUND DEBRIDEMENT Left 11/06/2017   Procedure: DEBRIDEMENT WOUND TOE BED OF FIFTH TOE LEFT FOOT;  Surgeon: Maeola Harman, MD;  Location: [facility] OR;  Service: Vascular;  Laterality: Left;   Social History   Occupational History  . Not on file  Tobacco Use  . Smoking status: Current Some Day Smoker    Packs/day: 0.25    Years: 43.00    Pack years: 10.75    Types: Cigarettes    Start date: 08/12/1974  . Smokeless tobacco: Never Used  . Tobacco comment: 2-3 cigarettes occasionally  Substance and Sexual Activity  . Alcohol use: Not Currently    Comment: 08/14/2017 "nothing since ~ 2012"  . Drug use: No  . Sexual activity: Not Currently

## 2018-05-08 ENCOUNTER — Ambulatory Visit (INDEPENDENT_AMBULATORY_CARE_PROVIDER_SITE_OTHER): Payer: Medicaid Other | Admitting: Physician Assistant

## 2018-05-08 ENCOUNTER — Encounter (INDEPENDENT_AMBULATORY_CARE_PROVIDER_SITE_OTHER): Payer: Self-pay | Admitting: Orthopedic Surgery

## 2018-05-08 VITALS — Ht 68.0 in | Wt 191.0 lb

## 2018-05-08 DIAGNOSIS — I739 Peripheral vascular disease, unspecified: Secondary | ICD-10-CM

## 2018-05-08 DIAGNOSIS — E1142 Type 2 diabetes mellitus with diabetic polyneuropathy: Secondary | ICD-10-CM

## 2018-05-08 DIAGNOSIS — E440 Moderate protein-calorie malnutrition: Secondary | ICD-10-CM

## 2018-05-08 DIAGNOSIS — Z89512 Acquired absence of left leg below knee: Secondary | ICD-10-CM

## 2018-05-08 NOTE — Progress Notes (Signed)
Office Visit Note   Patient: Joel Hebert           Date of Birth: 11-10-1955           MRN: [id number] Visit Date: 05/08/2018              Requested by: No referring provider defined for this encounter. PCP: Patient, No Pcp Per  Chief Complaint  Patient presents with  . Left Leg - Follow-up    Left BKA 01/04/18. Uses prosthetics company in [place]       HPI: The patient is a 62 yo male here with a friend for post operative follow up following left transtibial amputation on 01/04/18. He reports no problem with the amputation site and has been casted for his prosthesis. He is very pleased with his progress. He is using a stump shrinker and has some minimal crusting over the incisional area.  He is working to establish care with a new PCP in the [place] area for DM management.   Assessment & Plan: Visit Diagnoses:  1. S/P BKA (below knee amputation) unilateral, left (HCC)   2. PAD (peripheral artery disease) (HCC)   3. Type 2 diabetes mellitus with diabetic polyneuropathy, unspecified whether long term insulin use (HCC)   4. Moderate protein malnutrition (HCC)     Plan:Wash the transtibial amputation with soap and water and apply stump shrinker stocking. Continue with [facility] for prosthesis management. Counseled to call for any problems following application of prosthesis and Physical therapy. follow up in 3 months or sooner if any problems in the interim.   Follow-Up Instructions: Return in about 3 months (around 08/08/2018).   Ortho Exam  Patient is alert, oriented, no adenopathy, well-dressed, normal affect, normal respiratory effort. The left transtibial amputation site is healing well and has mild crusting over the incisional area. No signs of infection or cellulitis. No edema. Good flexion at the knee and full extension.   Imaging: No results found. No images are attached to the encounter.  Labs: Lab Results  Component Value Date   HGBA1C 7.2 (H)  12/14/2017   HGBA1C 7.0 (H) 10/09/2017   REPTSTATUS 01/12/2018 FINAL 01/07/2018   CULT  01/07/2018    NO GROWTH 5 DAYS Performed at [facility], [address], [place], [place] [address]      Lab Results  Component Value Date   ALBUMIN 2.4 (L) 01/23/2018   ALBUMIN 2.4 (L) 01/22/2018   ALBUMIN 2.6 (L) 01/21/2018    Body mass index is 29.04 kg/m.  Orders:  No orders of the defined types were placed in this encounter.  No orders of the defined types were placed in this encounter.    Procedures: No procedures performed  Clinical Data: No additional findings.  ROS:  All other systems negative, except as noted in the HPI. Review of Systems  Objective: Vital Signs: Ht 5\' 8"  (1.727 m)   Wt 191 lb (86.6 kg)   BMI 29.04 kg/m   Specialty Comments:  No specialty comments available.  PMFS History: Patient Active Problem List   Diagnosis Date Noted  . Chest pain 02/02/2018  . Symptomatic anemia 02/02/2018  . Anemia   . Uremia due to inadequate renal perfusion   . Acute renal failure (HCC)   . Sepsis (HCC) 01/03/2018  . Dehiscence of amputation stump (HCC) 12/27/2017  . History of transmetatarsal amputation of left foot (HCC) 12/14/2017  . Gangrene of left foot (HCC)   . Atherosclerosis  of native artery of left lower extremity with gangrene (HCC) 11/19/2017  . Peripheral artery disease (HCC) 11/08/2017  . PAD (peripheral artery disease) (HCC) 08/14/2017   Past Medical History:  Diagnosis Date  . Coronary artery disease   . High cholesterol   . Hypertension   . MI (myocardial infarction) (HCC) 08/13/2014; 01/2016- 03/2016 X 3  . PAD (peripheral artery disease) (HCC)   . Peripheral vascular disease (HCC)   . Type II diabetes mellitus (HCC)     Family History  Problem Relation Age of Onset  . Heart failure Brother   . Heart disease Brother   . Kidney disease Mother   . Heart disease Father     Past Surgical History:  Procedure Laterality Date  .  ABDOMINAL AORTOGRAM W/LOWER EXTREMITY  08/14/2017  . ABDOMINAL AORTOGRAM W/LOWER EXTREMITY N/A 08/14/2017   Procedure: ABDOMINAL AORTOGRAM W/LOWER EXTREMITY;  Surgeon: Nada Libman, MD;  Location: [facility] INVASIVE CV LAB;  Service: Cardiovascular;  Laterality: N/A;  . ABDOMINAL AORTOGRAM W/LOWER EXTREMITY N/A 09/05/2017   Procedure: ABDOMINAL AORTOGRAM W/LOWER EXTREMITY;  Surgeon: Maeola Harman, MD;  Location: [facility] INVASIVE CV LAB;  Service: Cardiovascular;  Laterality: N/A;  . AMPUTATION Left 10/09/2017   Procedure: AMPUTATION DIGIT LEFT FIFTH TOE;  Surgeon: Maeola Harman, MD;  Location: [facility] OR;  Service: Vascular;  Laterality: Left;  . AMPUTATION Left 12/14/2017   Procedure: LEFT TRANSMETATARSAL AMPUTATION;  Surgeon: Nadara Mustard, MD;  Location: [facility] OR;  Service: Orthopedics;  Laterality: Left;  . AMPUTATION Left 01/04/2018   Procedure: LEFT BELOW KNEE AMPUTATION;  Surgeon: Nadara Mustard, MD;  Location: [facility] OR;  Service: Orthopedics;  Laterality: Left;  . AMPUTATION TOE Left 10/09/2017   left foot 5th toe  . CORONARY ANGIOPLASTY WITH STENT PLACEMENT  01/2016; 03/2016   "1 + 1" (08/14/2017)  . IR FLUORO GUIDE CV LINE RIGHT  01/07/2018  . IR US GUIDE VASC ACCESS RIGHT  01/07/2018  . LOWER EXTREMITY ANGIOGRAPHY N/A 08/20/2017   Procedure: LOWER EXTREMITY ANGIOGRAPHY-rt leg;  Surgeon: Sherren Kerns, MD;  Location: [facility] INVASIVE CV LAB;  Service: Cardiovascular;  Laterality: N/A;  . PERIPHERAL VASCULAR ATHERECTOMY  08/14/2017   Procedure: PERIPHERAL VASCULAR ATHERECTOMY;  Surgeon: Nada Libman, MD;  Location: [facility] INVAS[facility] LAB;  Service: Cardiovascular;;  SFA   . PERIPHERAL VASCULAR ATHERECTOMY Left 09/05/2017   Procedure: PERIPHERAL VASCULAR ATHERECTOMY;  Surgeon: Maeola Harman, MD;  Location: [facility] INVASIVE CV LAB;  Service: Cardiovascular;  Laterality: Left;  Tibioperoneal and posterior tibial  . PERIPHERAL VASCULAR BALLOON ANGIOPLASTY  08/14/2017   Procedure: PERIPHERAL  VASCULAR BALLOON ANGIOPLASTY;  Surgeon: Nada Libman, MD;  Location: [facility] INVASIVE CV LAB;  Service: Cardiovascular;;  peroneal  . PERIPHERAL VASCULAR BALLOON ANGIOPLASTY  08/20/2017   Procedure: PERIPHERAL VASCULAR BALLOON ANGIOPLASTY;  Surgeon: Sherren Kerns, MD;  Location: [facility] INVASIVE [facility] LAB;  Service: Cardiovascular;;  . PERIPHERAL VASCULAR INTERVENTION  08/14/2017   Procedure: PERIPHERAL VASCULAR INTERVENTION;  Surgeon: Nada Libman, MD;  Location: [facility] INVAS[facility] LAB;  Service: Cardiovascular;;  SFA stent  . TONSILLECTOMY    . WOUND DEBRIDEMENT Left 11/06/2017   Procedure: DEBRIDEMENT WOUND TOE BED OF FIFTH TOE LEFT FOOT;  Surgeon: Maeola Harman, MD;  Location: [facility] OR;  Service: Vascular;  Laterality: Left;   Social History   Occupational History  . Not on file  Tobacco Use  . Smoking status: Current Some Day Smoker    Packs/day: 0.25    Years:  43.00    Pack years: 10.75    Types: Cigarettes    Start date: 08/12/1974  . Smokeless tobacco: Never Used  . Tobacco comment: 2-3 cigarettes occasionally  Substance and Sexual Activity  . Alcohol use: Not Currently    Comment: 08/14/2017 "nothing since ~ 2012"  . Drug use: No  . Sexual activity: Not Currently

## 2018-05-29 ENCOUNTER — Other Ambulatory Visit (INDEPENDENT_AMBULATORY_CARE_PROVIDER_SITE_OTHER): Payer: Self-pay

## 2018-05-29 ENCOUNTER — Telehealth (INDEPENDENT_AMBULATORY_CARE_PROVIDER_SITE_OTHER): Payer: Self-pay | Admitting: Orthopedic Surgery

## 2018-05-29 NOTE — Telephone Encounter (Signed)
Order faxed to number provided for physical therapy to eval and treat for prosthetic gait training. S/p Left BKA

## 2018-05-29 NOTE — Telephone Encounter (Signed)
Patient is requesting a prescription be sent to [facility] for services since he received his prosthetic. Their fax # [phone]   Patients # [phone]

## 2018-06-12 ENCOUNTER — Telehealth (INDEPENDENT_AMBULATORY_CARE_PROVIDER_SITE_OTHER): Payer: Self-pay | Admitting: Orthopedic Surgery

## 2018-06-12 NOTE — Telephone Encounter (Signed)
Done

## 2018-06-12 NOTE — Telephone Encounter (Signed)
[facility] @ [facility] called requesting medication list. Callback [phone]

## 2018-06-13 ENCOUNTER — Telehealth (INDEPENDENT_AMBULATORY_CARE_PROVIDER_SITE_OTHER): Payer: Self-pay | Admitting: Orthopedic Surgery

## 2018-06-13 NOTE — Telephone Encounter (Signed)
I received vm fom Dois DavenportSandra, patients sister in law stating records were not received at [facility]. I called pt back at [phone] and lmvm, I advised records were faxed 11/7 47 pages and I received confirmation. Advised that I would re fax records

## 2018-06-20 ENCOUNTER — Ambulatory Visit (INDEPENDENT_AMBULATORY_CARE_PROVIDER_SITE_OTHER): Payer: Medicaid Other | Admitting: Orthopedic Surgery

## 2018-08-08 ENCOUNTER — Ambulatory Visit (INDEPENDENT_AMBULATORY_CARE_PROVIDER_SITE_OTHER): Payer: Medicaid Other | Admitting: Orthopedic Surgery

## 2019-06-15 IMAGING — US US RENAL
1 series · 14 of 25 positions shown · non-contrast
Comparison: None.

CLINICAL DATA: Acute renal failure 1 day.

EXAM:
RENAL / URINARY TRACT ULTRASOUND COMPLETE

[Series 1: us renal · 0.22mm/px · 14 of 29 slices shown]
[im 1/29]
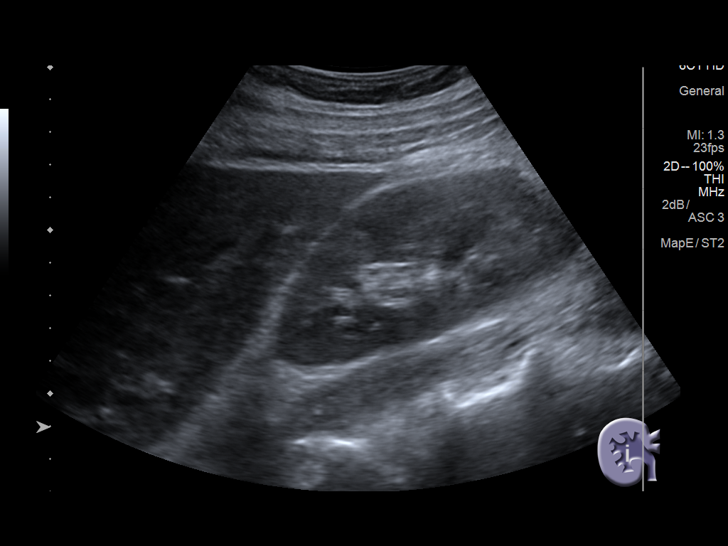
[im 3/29]
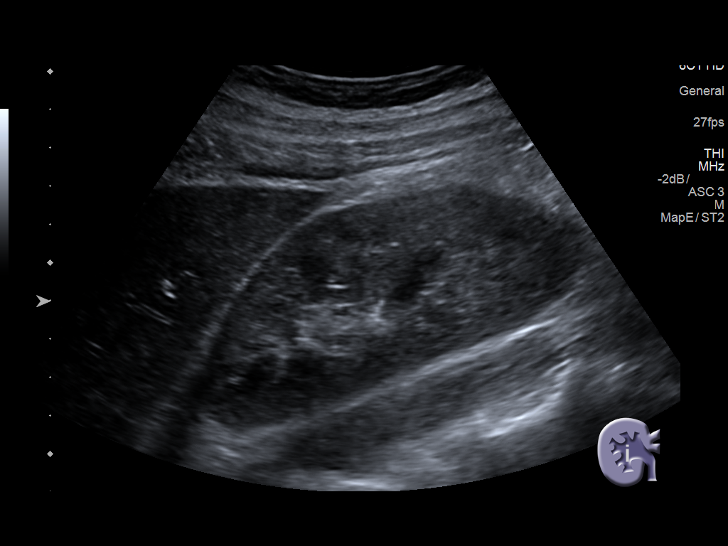
[im 5/29]
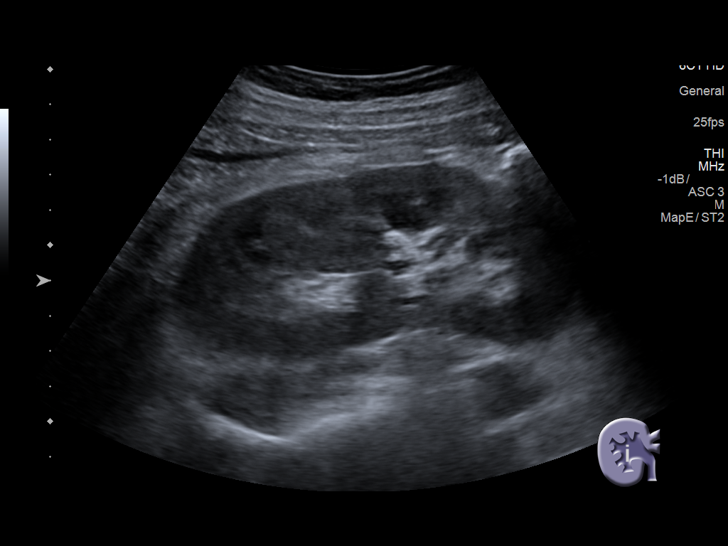
[im 8/29]
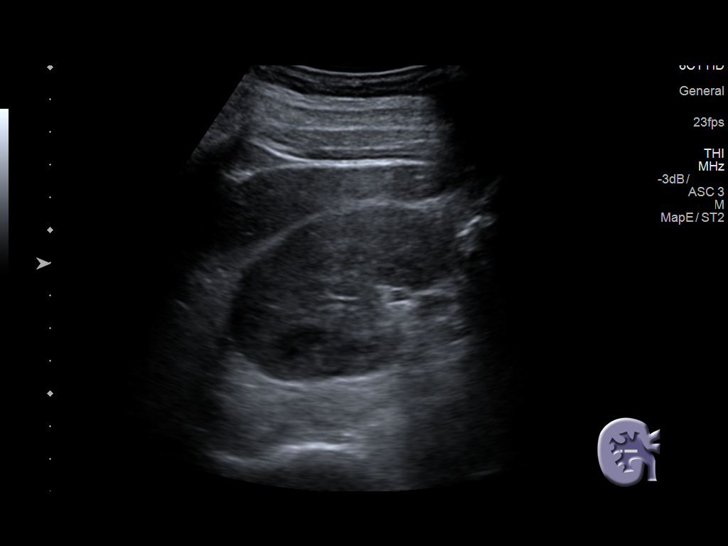
[im 10/29]
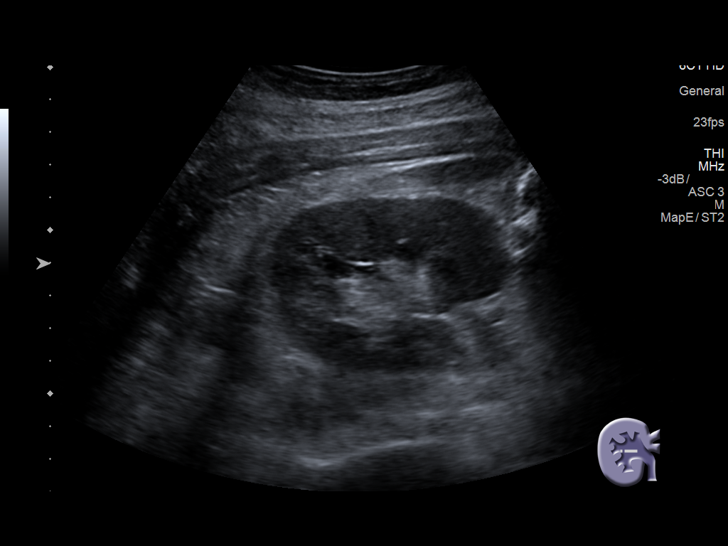
[im 11/29]
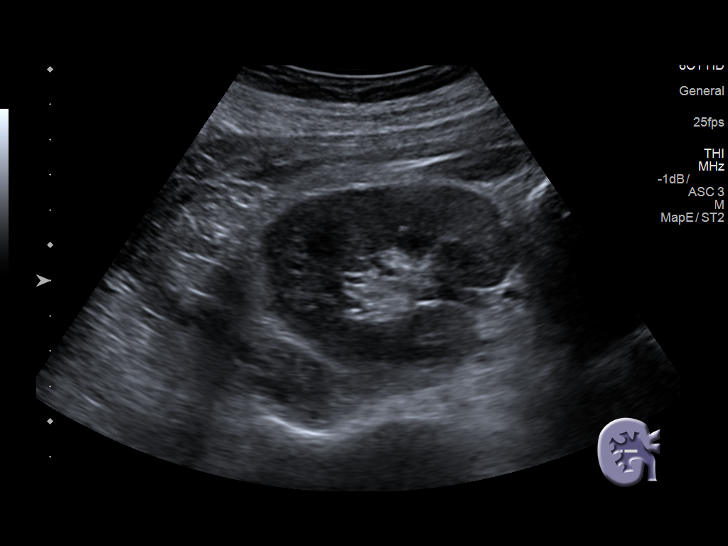
[im 13/29]
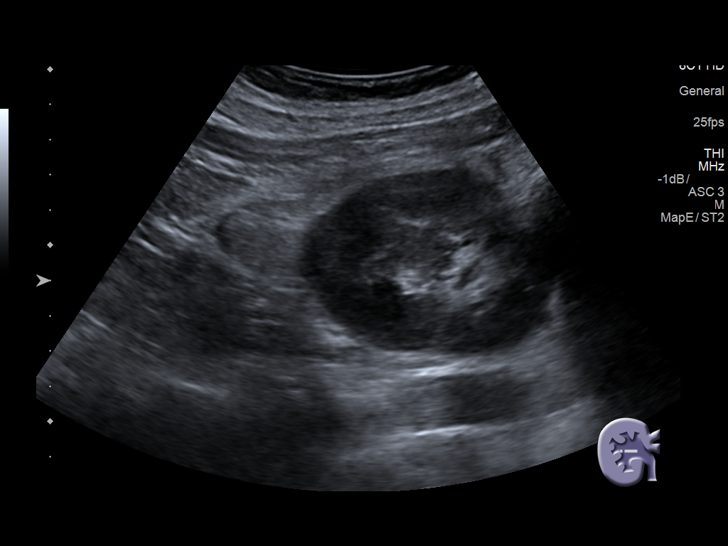
[im 16/29]
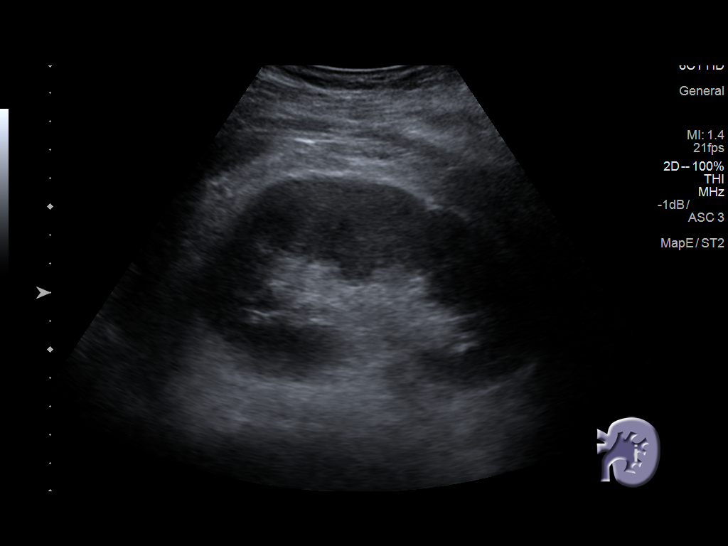
[im 18/29]
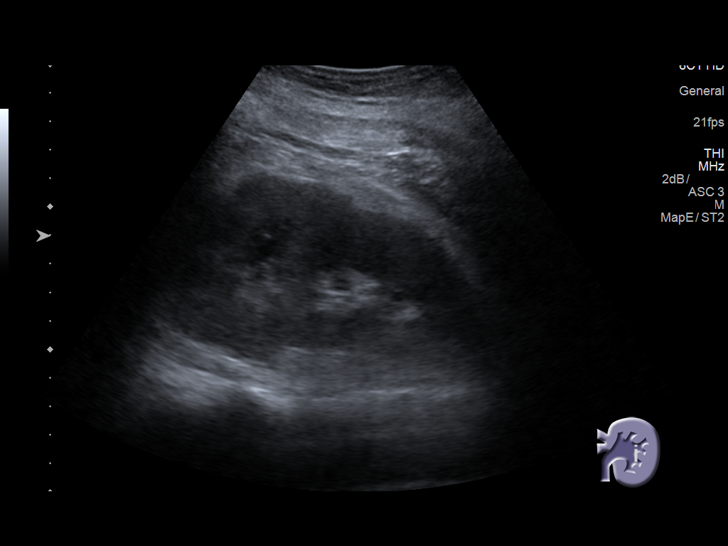
[im 19/29]
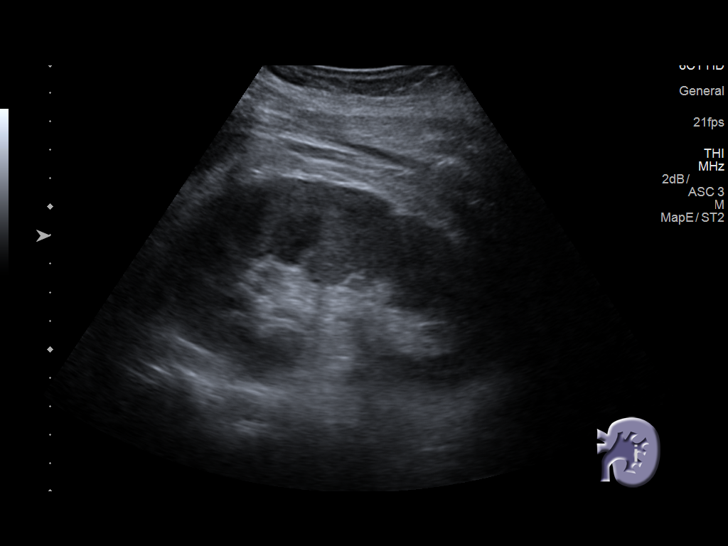
[im 22/29]
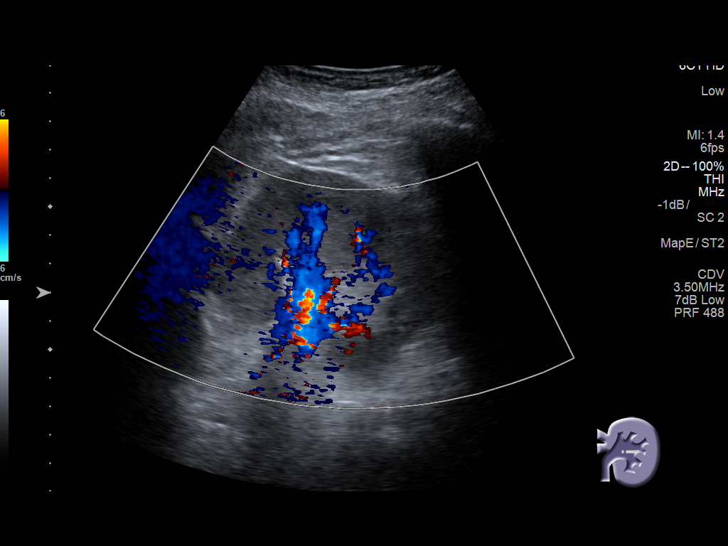
[im 24/29]
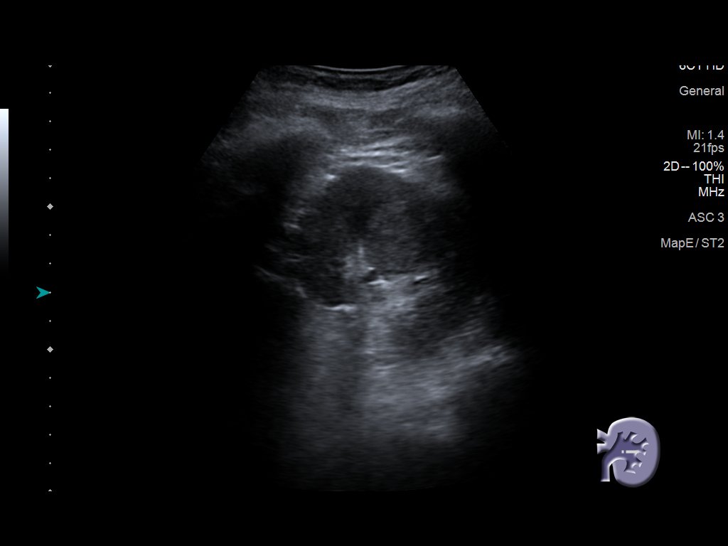
[im 26/29]
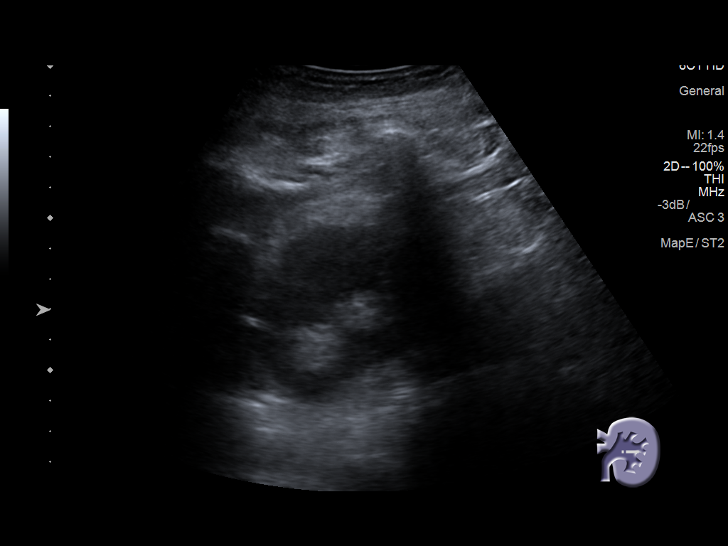
[im 29/29]
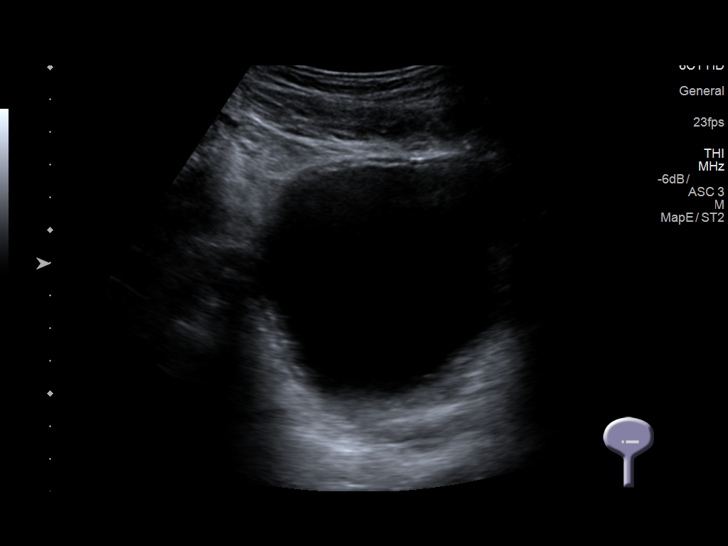

[14 of 25 positions shown; findings below may reference images not displayed]

FINDINGS: Right Kidney:

Length: 12.3 cm. Echogenicity within normal limits. No mass or
hydronephrosis visualized.

Left Kidney:

Length: 12.1 cm. Echogenicity within normal limits. No mass or
hydronephrosis visualized.

Bladder:

Appears normal for degree of bladder distention.
IMPRESSION: Normal renal ultrasound.

## 2019-08-26 IMAGING — DX DG FOOT COMPLETE 3+V*L*
3 series · 3 of 3 positions shown · non-contrast
Comparison: 07/23/2017

CLINICAL DATA: Left foot pain from the ankle to the shin. Possible
blood clot. History of diabetes.

EXAM:
LEFT FOOT - COMPLETE 3+ VIEW

[foot ap]
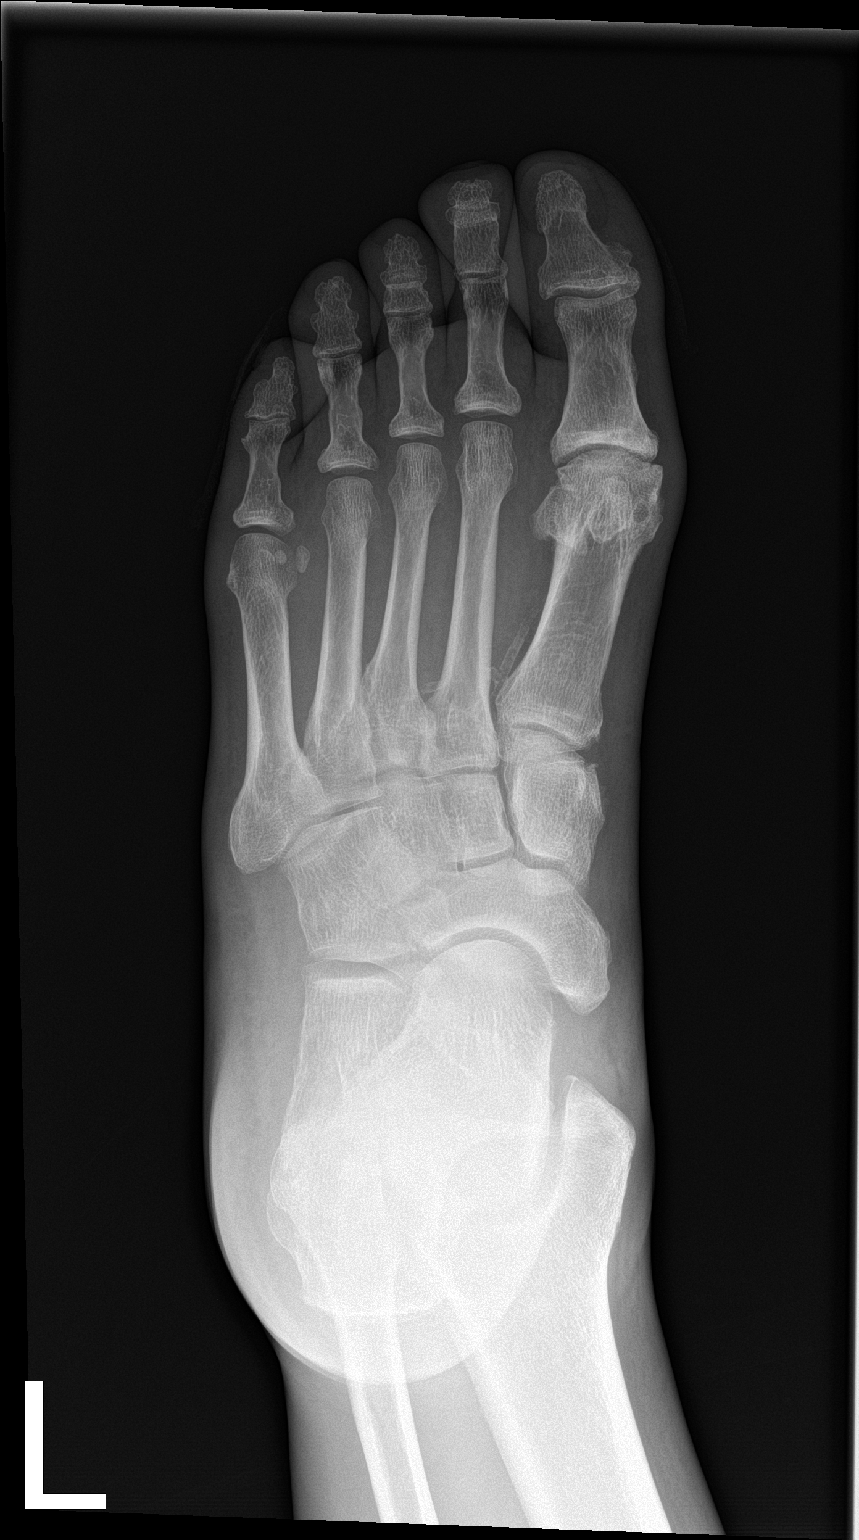

[foot obl]
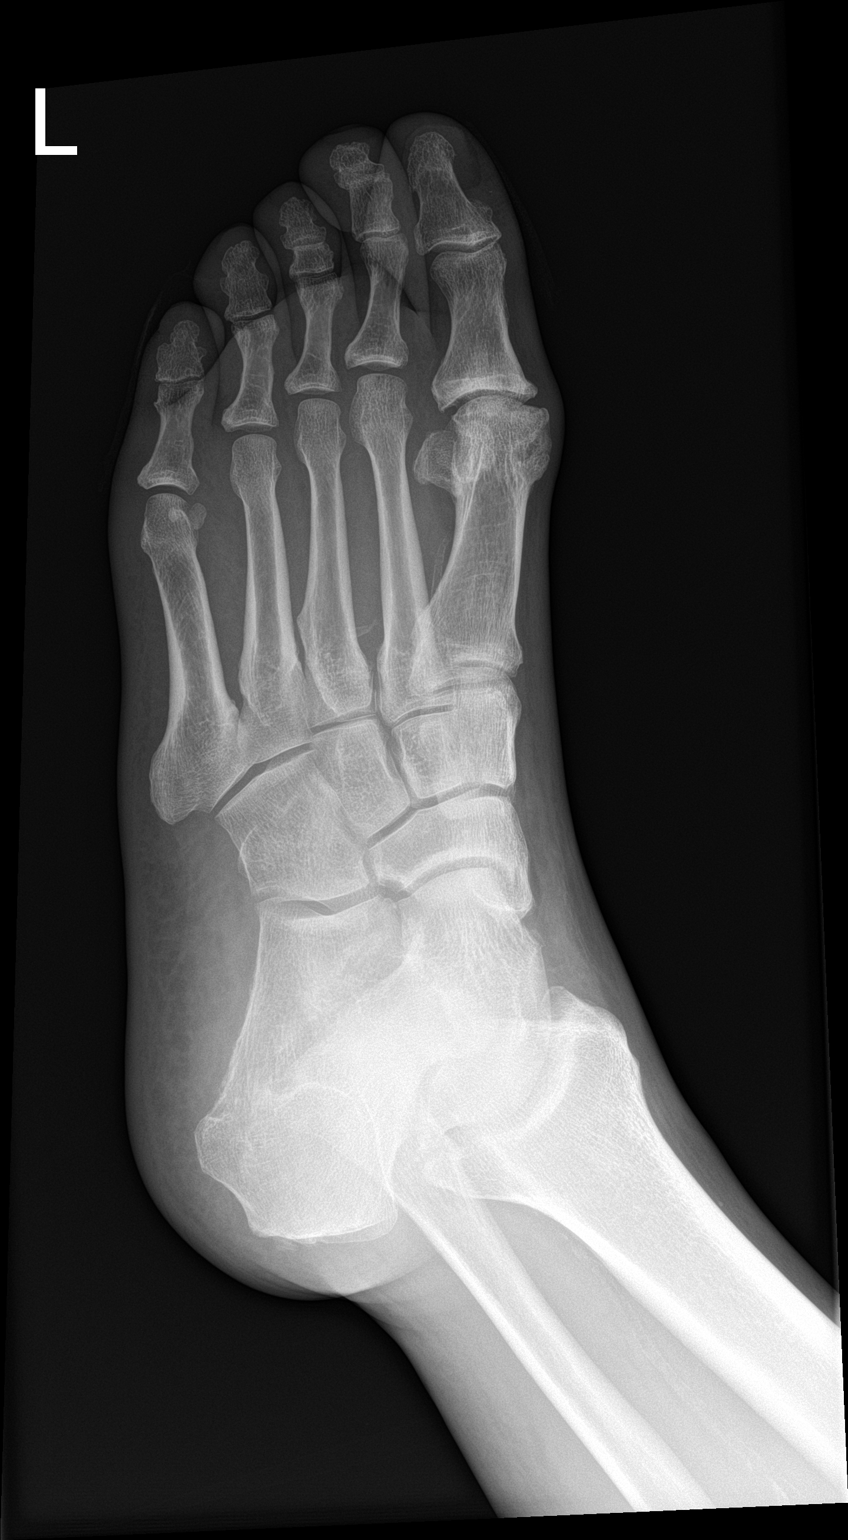

[foot lat]
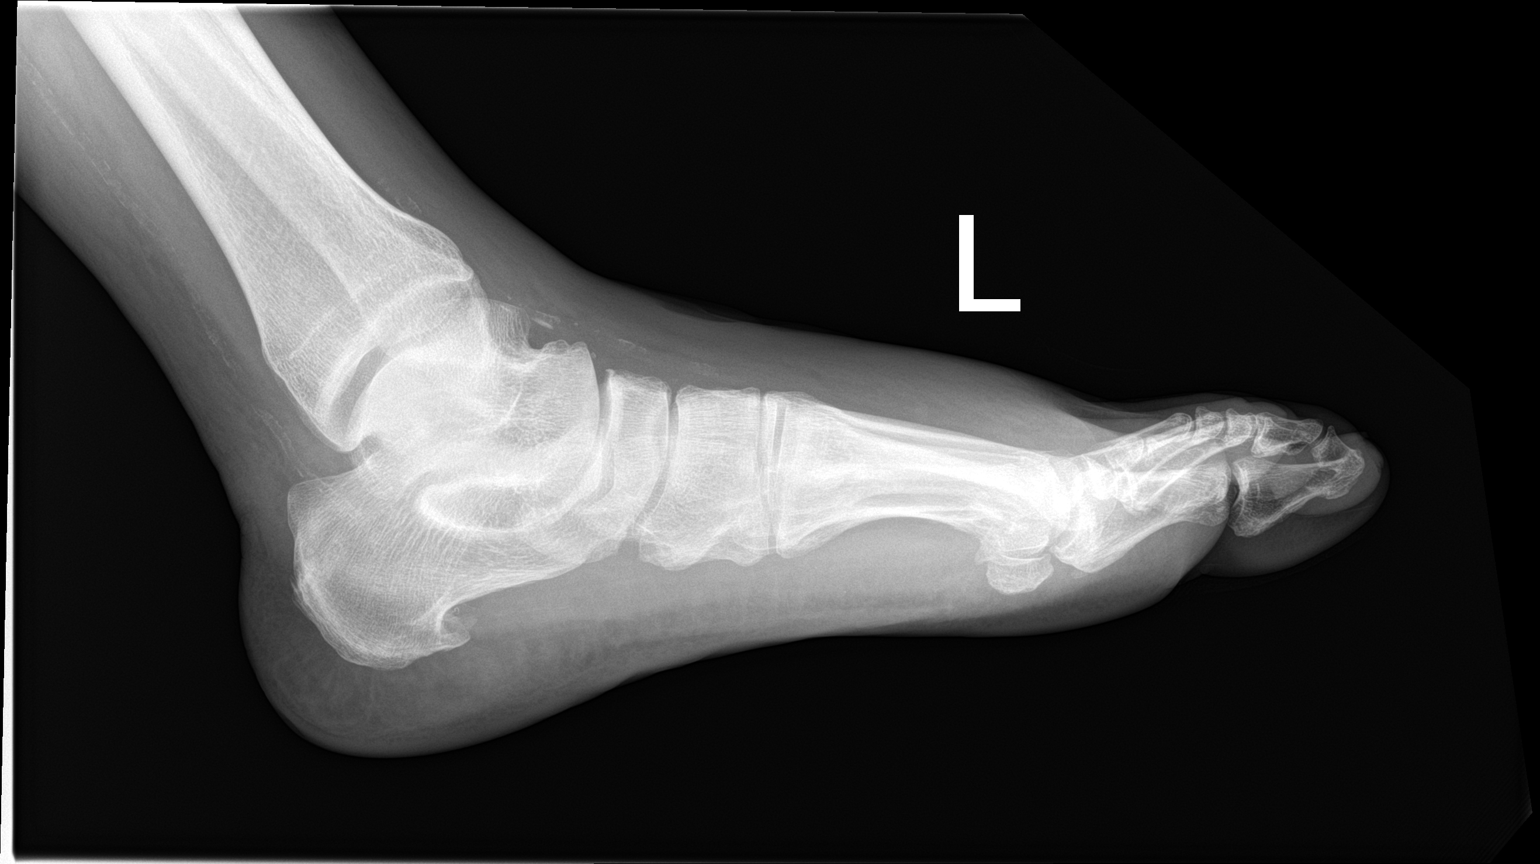

[3 of 3 positions shown; findings below may reference images not displayed]

FINDINGS: Degenerative changes in the first metatarsal-phalangeal joint. No
evidence of acute fracture or dislocation. No focal bone lesion or
bone destruction. Mild dorsal soft tissue swelling. No radiopaque
soft tissue foreign bodies. Vascular calcifications. Probable bone
cyst in the calcaneus.
IMPRESSION: No acute bony abnormalities. Degenerative changes in the first
metatarsal-phalangeal joint. Dorsal soft tissue swelling. Vascular
calcifications.

## 2020-01-28 IMAGING — DX DG CHEST 1V PORT
1 series · 1 of 1 positions shown · non-contrast
Comparison: 01/03/2018

CLINICAL DATA: Fever

EXAM:
PORTABLE CHEST 1 VIEW

[chest]
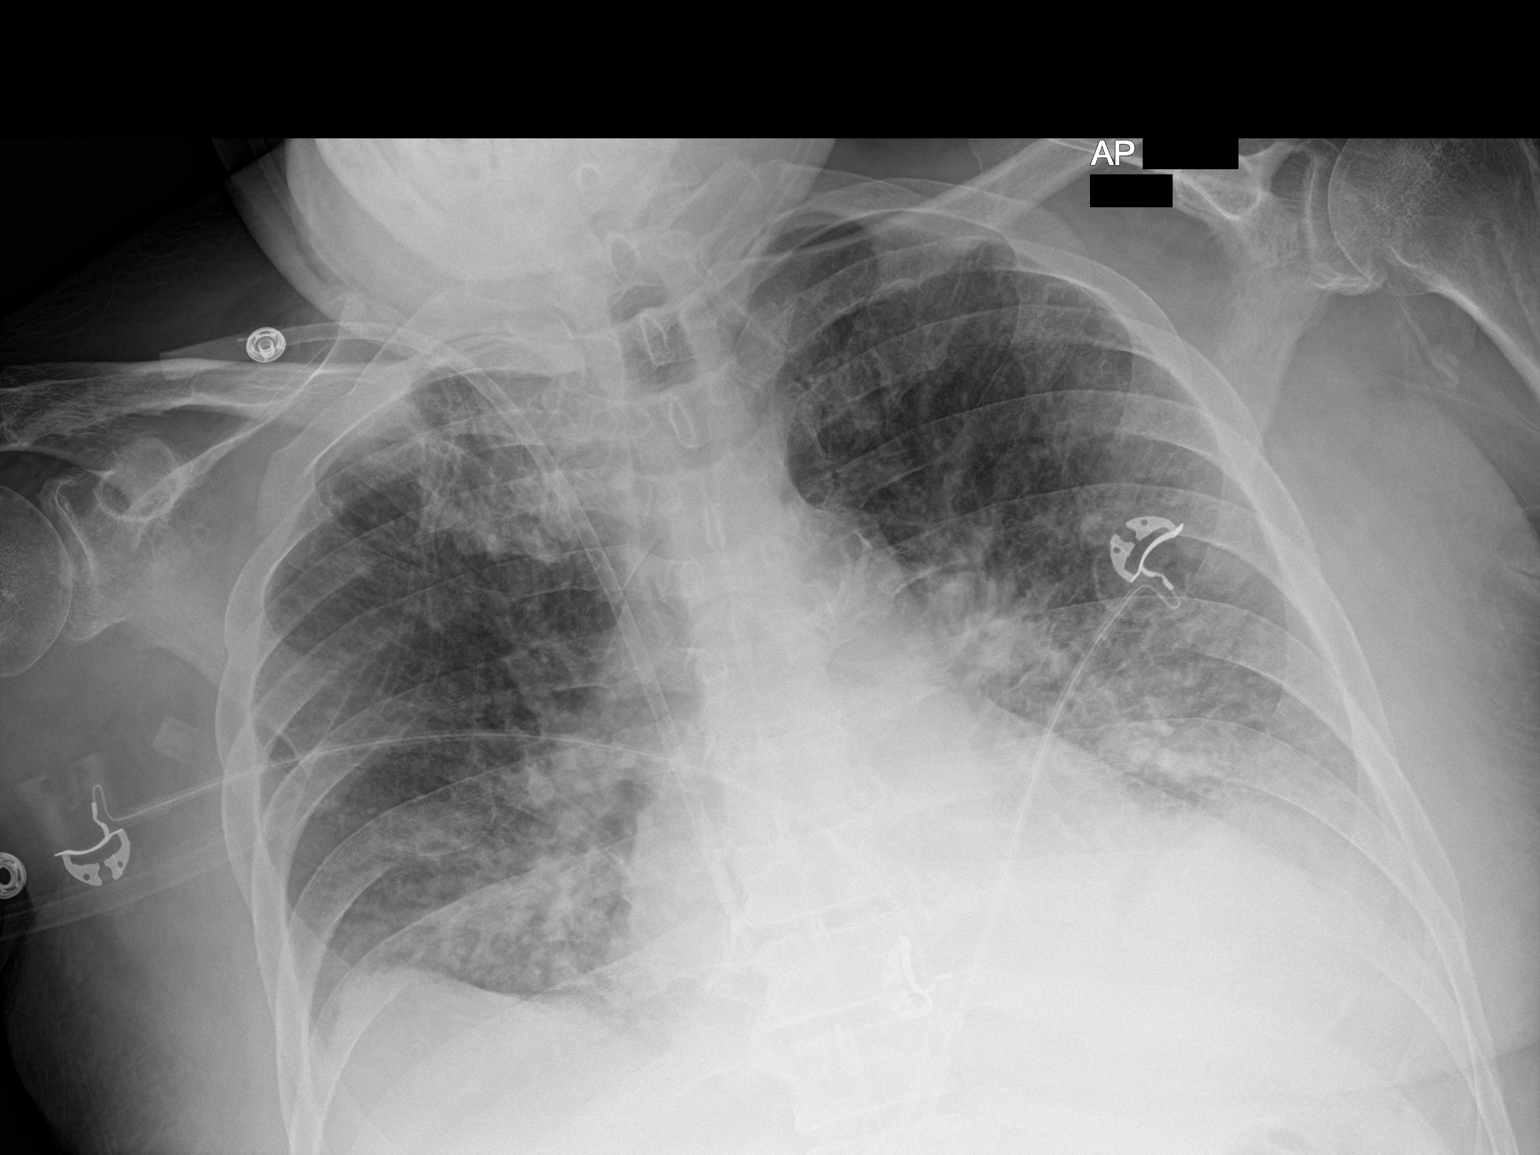

[1 of 1 positions shown; findings below may reference images not displayed]

FINDINGS: Development of small pleural effusions. Cardiomegaly with vascular
congestion and diffuse interstitial edema, increased compared to
prior. Focal airspace disease at the bases. Aortic atherosclerosis.
No pneumothorax. Right IJ central venous catheter tip overlies the
right atrium.
IMPRESSION: 1. Cardiomegaly with vascular congestion and development of moderate
pulmonary edema and small pleural effusion.
2. Bibasilar airspace disease, left greater than right, atelectasis
versus pneumonia
3. New right IJ central venous catheter with tip projecting over the
right atrium

## 2020-11-21 ENCOUNTER — Inpatient Hospital Stay
Admission: AD | Admit: 2020-11-21 | Discharge: 2021-03-03 | Disposition: A | Discharge status: Hospice | Payer: Medicare Other | Source: Other Acute Inpatient Hospital

## 2020-11-21 ENCOUNTER — Other Ambulatory Visit (HOSPITAL_COMMUNITY): Payer: Medicare Other

## 2020-11-21 DIAGNOSIS — Z89512 Acquired absence of left leg below knee: Secondary | ICD-10-CM

## 2020-11-21 DIAGNOSIS — R509 Fever, unspecified: Secondary | ICD-10-CM

## 2020-11-21 DIAGNOSIS — D72829 Elevated white blood cell count, unspecified: Secondary | ICD-10-CM

## 2020-11-21 DIAGNOSIS — J9690 Respiratory failure, unspecified, unspecified whether with hypoxia or hypercapnia: Secondary | ICD-10-CM

## 2020-11-21 DIAGNOSIS — J151 Pneumonia due to Pseudomonas: Secondary | ICD-10-CM | POA: Diagnosis present

## 2020-11-21 DIAGNOSIS — K567 Ileus, unspecified: Secondary | ICD-10-CM

## 2020-11-21 DIAGNOSIS — Z431 Encounter for attention to gastrostomy: Secondary | ICD-10-CM

## 2020-11-21 DIAGNOSIS — J9621 Acute and chronic respiratory failure with hypoxia: Secondary | ICD-10-CM | POA: Diagnosis present

## 2020-11-21 DIAGNOSIS — G931 Anoxic brain damage, not elsewhere classified: Secondary | ICD-10-CM | POA: Diagnosis present

## 2020-11-21 DIAGNOSIS — N189 Chronic kidney disease, unspecified: Secondary | ICD-10-CM | POA: Diagnosis present

## 2020-11-21 DIAGNOSIS — N179 Acute kidney failure, unspecified: Secondary | ICD-10-CM | POA: Diagnosis present

## 2020-11-21 DIAGNOSIS — Z930 Tracheostomy status: Secondary | ICD-10-CM

## 2020-11-21 DIAGNOSIS — J9601 Acute respiratory failure with hypoxia: Secondary | ICD-10-CM

## 2020-11-21 DIAGNOSIS — D729 Disorder of white blood cells, unspecified: Secondary | ICD-10-CM

## 2020-11-21 DIAGNOSIS — Z452 Encounter for adjustment and management of vascular access device: Secondary | ICD-10-CM

## 2020-11-21 HISTORY — DX: Anoxic brain damage, not elsewhere classified: G93.1

## 2020-11-21 HISTORY — DX: Pneumonia due to Pseudomonas: J15.1

## 2020-11-21 HISTORY — DX: Acute and chronic respiratory failure with hypoxia: J96.21

## 2020-11-21 HISTORY — DX: Acute kidney failure, unspecified: N17.9

## 2020-11-21 HISTORY — DX: Chronic kidney disease, unspecified: N18.9

## 2020-11-21 LAB — BLOOD GAS, ARTERIAL
Acid-Base Excess: 1 mmol/L (ref 0.0–2.0)
Bicarbonate: 25.6 mmol/L (ref 20.0–28.0)
Drawn by: 164
FIO2: 40
O2 Saturation: 91.2 %
Patient temperature: 37
pCO2 arterial: 45.4 mmHg (ref 32.0–48.0)
pH, Arterial: 7.371 (ref 7.350–7.450)
pO2, Arterial: 62 mmHg — ABNORMAL LOW (ref 83.0–108.0)

## 2020-11-21 MED ORDER — DIATRIZOATE MEGLUMINE & SODIUM 66-10 % PO SOLN
ORAL | Status: AC
  Filled 2020-11-21: qty 30

## 2020-11-22 ENCOUNTER — Other Ambulatory Visit (HOSPITAL_COMMUNITY): Payer: Medicare Other

## 2020-11-22 DIAGNOSIS — J9621 Acute and chronic respiratory failure with hypoxia: Secondary | ICD-10-CM

## 2020-11-22 DIAGNOSIS — Z930 Tracheostomy status: Secondary | ICD-10-CM

## 2020-11-22 DIAGNOSIS — N179 Acute kidney failure, unspecified: Secondary | ICD-10-CM

## 2020-11-22 DIAGNOSIS — J151 Pneumonia due to Pseudomonas: Secondary | ICD-10-CM

## 2020-11-22 DIAGNOSIS — G931 Anoxic brain damage, not elsewhere classified: Secondary | ICD-10-CM | POA: Diagnosis not present

## 2020-11-22 DIAGNOSIS — N189 Chronic kidney disease, unspecified: Secondary | ICD-10-CM

## 2020-11-22 LAB — COMPREHENSIVE METABOLIC PANEL
ALT: 25 U/L (ref 0–44)
AST: 38 U/L (ref 15–41)
Albumin: 2.1 g/dL — ABNORMAL LOW (ref 3.5–5.0)
Alkaline Phosphatase: 135 U/L — ABNORMAL HIGH (ref 38–126)
Anion gap: 11 (ref 5–15)
BUN: 49 mg/dL — ABNORMAL HIGH (ref 8–23)
CO2: 27 mmol/L (ref 22–32)
Calcium: 8.7 mg/dL — ABNORMAL LOW (ref 8.9–10.3)
Chloride: 96 mmol/L — ABNORMAL LOW (ref 98–111)
Creatinine, Ser: 4.2 mg/dL — ABNORMAL HIGH (ref 0.61–1.24)
GFR, Estimated: 15 mL/min — ABNORMAL LOW (ref >60)
Glucose, Bld: 117 mg/dL — ABNORMAL HIGH (ref 70–99)
Potassium: 3.3 mmol/L — ABNORMAL LOW (ref 3.5–5.1)
Sodium: 134 mmol/L — ABNORMAL LOW (ref 135–145)
Total Bilirubin: 0.6 mg/dL (ref 0.3–1.2)
Total Protein: 5.7 g/dL — ABNORMAL LOW (ref 6.5–8.1)

## 2020-11-22 LAB — CBC WITH DIFFERENTIAL/PLATELET
Abs Immature Granulocytes: 0.09 10*3/uL — ABNORMAL HIGH (ref 0.00–0.07)
Basophils Absolute: 0.1 10*3/uL (ref 0.0–0.1)
Basophils Relative: 1 %
Eosinophils Absolute: 0.5 10*3/uL (ref 0.0–0.5)
Eosinophils Relative: 6 %
HCT: 26 % — ABNORMAL LOW (ref 39.0–52.0)
Hemoglobin: 8.1 g/dL — ABNORMAL LOW (ref 13.0–17.0)
Immature Granulocytes: 1 %
Lymphocytes Relative: 17 %
Lymphs Abs: 1.4 10*3/uL (ref 0.7–4.0)
MCH: 29.1 pg (ref 26.0–34.0)
MCHC: 31.2 g/dL (ref 30.0–36.0)
MCV: 93.5 fL (ref 80.0–100.0)
Monocytes Absolute: 0.7 10*3/uL (ref 0.1–1.0)
Monocytes Relative: 9 %
Neutro Abs: 5.6 10*3/uL (ref 1.7–7.7)
Neutrophils Relative %: 66 %
Platelets: 332 10*3/uL (ref 150–400)
RBC: 2.78 MIL/uL — ABNORMAL LOW (ref 4.22–5.81)
RDW: 17.9 % — ABNORMAL HIGH (ref 11.5–15.5)
WBC: 8.4 10*3/uL (ref 4.0–10.5)
nRBC: 0 % (ref 0.0–0.2)

## 2020-11-22 LAB — HEPATITIS B SURFACE ANTIGEN: Hepatitis B Surface Ag: NONREACTIVE

## 2020-11-22 LAB — RENAL FUNCTION PANEL
Albumin: 2.1 g/dL — ABNORMAL LOW (ref 3.5–5.0)
Anion gap: 10 (ref 5–15)
BUN: 49 mg/dL — ABNORMAL HIGH (ref 8–23)
CO2: 26 mmol/L (ref 22–32)
Calcium: 8.7 mg/dL — ABNORMAL LOW (ref 8.9–10.3)
Chloride: 96 mmol/L — ABNORMAL LOW (ref 98–111)
Creatinine, Ser: 4.19 mg/dL — ABNORMAL HIGH (ref 0.61–1.24)
GFR, Estimated: 15 mL/min — ABNORMAL LOW (ref >60)
Glucose, Bld: 115 mg/dL — ABNORMAL HIGH (ref 70–99)
Phosphorus: 4.5 mg/dL (ref 2.5–4.6)
Potassium: 3.3 mmol/L — ABNORMAL LOW (ref 3.5–5.1)
Sodium: 132 mmol/L — ABNORMAL LOW (ref 135–145)

## 2020-11-22 LAB — HEMOGLOBIN A1C
Hgb A1c MFr Bld: 5.3 % (ref 4.8–5.6)
Mean Plasma Glucose: 105.41 mg/dL

## 2020-11-22 LAB — HEPATITIS B CORE ANTIBODY, TOTAL: Hep B Core Total Ab: NONREACTIVE

## 2020-11-22 NOTE — Consult Note (Signed)
[facility] CONSULT NOTE    Date: 11/22/2020                  Patient Name:  Bronson CurbMelvin Robert J Bartnik  MRN: [id number]  DOB: 12/28/1955  Age / Sex: 65 y.o., male         PCP: Patient, No Pcp Per (Inactive)                 Service Requesting Consult:  Hospitalist                 Reason for Consult:  Evaluation management of ESRD            History of Present Illness: Patient is a 65 y.o. male with a PMHx of ESRD on HD TTS, anemia of chronic kidney disease, hypertension, acute respiratory failure status post tracheostomy placement, seizure disorder, hyperlipidemia, diabetes mellitus type 2, history of hyperkalemia, history of coronary artery disease, peripheral vascular disease who was admitted to [facility] on 11/21/2020 for ongoing treatment of acute respiratory failure status post tracheostomy placement and ESRD.  Patient has longstanding history of ESRD.  [facility]  During his most recent hospitalization the patient developed acute respiratory failure and is status post tracheostomy placement.  He currently has a left IJ PermCath in place.  Patient unable to provide any history at this point in time.  He apparently last had dialysis on Saturday.  Labs are currently pending.   Medications: Outpatient medications: Medications Prior to Admission  Medication Sig Dispense Refill Last Dose  . acetaminophen (TYLENOL) 500 MG tablet Take 500 mg by mouth every 6 (six) hours as needed for mild pain or headache.      Marland Kitchen. amLODipine (NORVASC) 10 MG tablet Take 1 tablet (10 mg total) by mouth at bedtime.     Marland Kitchen. aspirin EC 81 MG tablet Take 81 mg by mouth 2 (two) times daily.      . bisacodyl (DULCOLAX) 5 MG EC tablet Take 10 mg by mouth daily.     . cloNIDine (CATAPRES) 0.1 MG tablet Take 0.1 mg by mouth daily.     . clopidogrel (PLAVIX) 75 MG tablet Take 1 tablet (75 mg total) by mouth daily with breakfast. (Patient taking  differently: Take 75 mg by mouth daily with breakfast. Have not taken in last month per pt 03-27-18) 30 tablet 11   . doxycycline (VIBRA-TABS) 100 MG tablet Take 1 tablet (100 mg total) by mouth 2 (two) times daily. 30 tablet 0   . Ergocalciferol 2000 units TABS Take 2,000 Units by mouth daily.     . ferrous sulfate 325 (65 FE) MG tablet Take 1 tablet (325 mg total) by mouth daily with breakfast.  3   . gabapentin (NEURONTIN) 300 MG capsule Take 1 capsule (300 mg total) by mouth at bedtime.     . hydrALAZINE (APRESOLINE) 100 MG tablet Take 1 tablet (100 mg total) by mouth every 8 (eight) hours.     . hydrocortisone cream 1 % Apply 1 application topically 3 (three) times daily as needed for itching (minor skin irritation). 30 g 0   . insulin lispro (ADMELOG SOLOSTAR) 100 UNIT/ML KiwkPen Inject 0-10 Units into the skin 3 (three) times daily before meals. Per sliding scale: CBG 0-150 0 units, 151-200 2 units, 201-250 4 units, 251-300 6 units, 301-350 8 units 351-400 10 units, call PCP if CBG <70 or >400     . methocarbamol (  ROBAXIN) 500 MG tablet Take 1 tablet (500 mg total) by mouth every 8 (eight) hours as needed for muscle spasms. 30 tablet 0   . metoprolol tartrate (LOPRESSOR) 100 MG tablet Take 100 mg by mouth 2 (two) times daily.     . nitroGLYCERIN (NITROSTAT) 0.4 MG SL tablet Place 0.4 mg under the tongue every 5 (five) minutes as needed for chest pain.     . Nutritional Supplements (FEEDING SUPPLEMENT, NEPRO CARB STEADY,) LIQD Take 237 mLs by mouth daily.  0   . oxyCODONE-acetaminophen (PERCOCET) 10-325 MG tablet Take 1 tablet by mouth every 4 (four) hours as needed for pain. (Patient taking differently: Take 1 tablet by mouth every 6 (six) hours as needed for pain. ) 30 tablet 0   . polyethylene glycol (MIRALAX / GLYCOLAX) packet Take 17 g by mouth daily as needed for mild constipation. 14 each 0   . rosuvastatin (CRESTOR) 20 MG tablet Take 20 mg by mouth every evening.      . vitamin B-12 1000  MCG tablet Take 1 tablet (1,000 mcg total) by mouth daily.     . Vitamin D, Ergocalciferol, (DRISDOL) 50000 units CAPS capsule Take 50,000 Units by mouth every Wednesday.       Current medications: Heparin 5000 units subcutaneous every 8 hours, valproate 1000 mg IV 3 times daily, Humalog sliding scale, amantadine 100 mg daily, amlodipine 10 mg daily, aspirin 81 mg daily, atorvastatin 40 mg nightly, carvedilol 25 mg twice daily, clonazepam 2 mg 3 times daily, clonidine 0.3 mg 3 times daily, Plavix 75 mg daily, fish oil 4 g daily, hydralazine 100 mg 3 times daily, ipratropium albuterol 3 mL inhaled 3 times daily, isosorbide dinitrate 20 mg 3 times daily, lacosamide 200 mg twice daily, Lantus 14 units subcutaneous nightly, melatonin 3 mg nightly, MiraLAX 17 g daily, phenobarbital 145 mg twice daily, phenytoin 150 mg twice daily, Protonix 40 mg daily, senna 1 tablet twice daily  Allergies: No Known Allergies    Past Medical History: Past Medical History:  Diagnosis Date  . Coronary artery disease   . High cholesterol   . Hypertension   . MI (myocardial infarction) (HCC) 08/13/2014; 01/2016- 03/2016 X 3  . PAD (peripheral artery disease) (HCC)   . Peripheral vascular disease (HCC)   . Type II diabetes mellitus (HCC)   ESRD on HD TTS Anemia of chronic kidney disease Secondary hyperparathyroidism Acute respiratory failure status post tracheostomy placement   Past Surgical History: Past Surgical History:  Procedure Laterality Date  . ABDOMINAL AORTOGRAM W/LOWER EXTREMITY  08/14/2017  . ABDOMINAL AORTOGRAM W/LOWER EXTREMITY N/A 08/14/2017   Procedure: ABDOMINAL AORTOGRAM W/LOWER EXTREMITY;  Surgeon: Nada Libman, MD;  Location: [facility] INVASIVE CV LAB;  Service: Cardiovascular;  Laterality: N/A;  . ABDOMINAL AORTOGRAM W/LOWER EXTREMITY N/A 09/05/2017   Procedure: ABDOMINAL AORTOGRAM W/LOWER EXTREMITY;  Surgeon: Maeola Harman, MD;  Location[facility] INVASIVE CV LAB;  Service: Cardiovascular;   Laterality: N/A;  . AMPUTATION Left 10/09/2017   Procedure: AMPUTATION DIGIT LEFT FIFTH TOE;  Surgeon: Maeola Harman, MD;  Location: [facility] OR;  Service: Vascular;  Laterality: Left;  . AMPUTATION Left 12/14/2017   Procedure: LEFT TRANSMETATARSAL AMPUTATION;  Surgeon: Nadara Mustard, MD;  Location: [facility] OR;  Service: Orthopedics;  Laterality: Left;  . AMPUTATION Left 01/04/2018   Procedure: LEFT BELOW KNEE AMPUTATION;  Surgeon: Nadara Mustard, MD;  Location: [facility] OR;  Service: Orthopedics;  Laterality: Left;  . AMPUTATION TOE Left 10/09/2017   left foot  5th toe  . CORONARY ANGIOPLASTY WITH STENT PLACEMENT  01/2016; 03/2016   "1 + 1" (08/14/2017)  . IR FLUORO GUIDE CV LINE RIGHT  01/07/2018  . IR US GUIDE VASC ACCESS RIGHT  01/07/2018  . LOWER EXTREMITY ANGIOGRAPHY N/A 08/20/2017   Procedure: LOWER EXTREMITY ANGIOGRAPHY-rt leg;  Surgeon: Sherren Kerns, MD;  Location: [facility] INVASIVE CV LAB;  Service: Cardiovascular;  Laterality: N/A;  . PERIPHERAL VASCULAR ATHERECTOMY  08/14/2017   Procedure: PERIPHERAL VASCULAR ATHERECTOMY;  Surgeon: Nada Libman, MD;  Location: [facility] INVASIVE CV LAB;  Service: Cardiovascular;;  SFA   . PERIPHERAL VASCULAR ATHERECTOMY Left 09/05/2017   Procedure: PERIPHERAL VASCULAR ATHERECTOMY;  Surgeon: Maeola Harman, MD;  Location: [facility] INVASIVE CV LAB;  Service: Cardiovascular;  Laterality: Left;  Tibioperoneal and posterior tibial  . PERIPHERAL VASCULAR BALLOON ANGIOPLASTY  08/14/2017   Procedure: PERIPHERAL VASCULAR BALLOON ANGIOPLASTY;  Surgeon: Nada Libman, MD;  Location: [facility] INVASIVE CV LAB;  Service: Cardiovascular;;  peroneal  . PERIPHERAL VASCULAR BALLOON ANGIOPLASTY  08/20/2017   Procedure: PERIPHERAL VASCULAR BALLOON ANGIOPLASTY;  Surgeon: Sherren Kerns, MD;  Location: [facility] INVASIVE CV LAB;  Service: Cardiovascular;;  . PERIPHERAL VASCULAR INTERVENTION  08/14/2017   Procedure: PERIPHERAL VASCULAR INTERVENTION;  Surgeon: Nada Libman, MD;  Location: [facility]  INVASIVE CV LAB;  Service: Cardiovascular;;  SFA stent  . TONSILLECTOMY    . WOUND DEBRIDEMENT Left 11/06/2017   Procedure: DEBRIDEMENT WOUND TOE BED OF FIFTH TOE LEFT FOOT;  Surgeon: Maeola Harman, MD;  Location: [name] OR;  Service: Vascular;  Laterality: Left;     Family History: Family History  Problem Relation Age of Onset  . Heart failure Brother   . Heart disease Brother   . Kidney disease Mother   . Heart disease Father      Social History: Social History   Socioeconomic History  . Marital status: Married    Spouse name: Not on file  . Number of children: Not on file  . Years of education: Not on file  . Highest education level: Not on file  Occupational History  . Not on file  Tobacco Use  . Smoking status: Current Some Day Smoker    Packs/day: 0.25    Years: 43.00    Pack years: 10.75    Types: Cigarettes    Start date: 08/12/1974  . Smokeless tobacco: Never Used  . Tobacco comment: 2-3 cigarettes occasionally  Vaping Use  . Vaping Use: Never used  Substance and Sexual Activity  . Alcohol use: Not Currently    Comment: 08/14/2017 "nothing since ~ 2012"  . Drug use: No  . Sexual activity: Not Currently  Other Topics Concern  . Not on file  Social History Narrative  . Not on file   Social Determinants of Health   Financial Resource Strain: Not on file  Food Insecurity: Not on file  Transportation Needs: Not on file  Physical Activity: Not on file  Stress: Not on file  Social Connections: Not on file  Intimate Partner Violence: Not on file     Review of Systems: Unable to obtain from patient as he is currently on the ventilator  Vital Signs: Temperature 97.9 pulse 70 respirations 30 blood pressure 134/68 Weight trends: There were no vitals filed for this visit.   Physical Exam: General:  Critically ill-appearing  Head:  Normocephalic, atraumatic. Moist oral mucosal membranes  Eyes:  Anicteric  Neck:  Supple  Lungs:   Coarse breath  sounds bilateral, vent  assisted  Heart:  S1S2 no rubs  Abdomen:   Soft, nontender, bowel sounds present  Extremities:  1+ peripheral edema.  Neurologic:  Arousable but not following commands  Skin:  No lesions  Access:  Left IJ PermCath    Lab results: Basic Metabolic Panel: No results for input(s): NA, K, CL, CO2, GLUCOSE, BUN, CREATININE, CALCIUM, MG, PHOS in the last 168 hours.  Liver Function Tests: No results for input(s): AST, ALT, ALKPHOS, BILITOT, PROT, ALBUMIN in the last 168 hours. No results for input(s): LIPASE, AMYLASE in the last 168 hours. No results for input(s): AMMONIA in the last 168 hours.  CBC: Recent Labs  Lab 11/22/20 0703  WBC 8.4  NEUTROABS 5.6  HGB 8.1*  HCT 26.0*  MCV 93.5  PLT 332    Cardiac Enzymes: No results for input(s): CKTOTAL, CKMB, CKMBINDEX, TROPONINI in the last 168 hours.  BNP: Invalid input(s): POCBNP  CBG: No results for input(s): GLUCAP in the last 168 hours.  Microbiology: Results for orders placed or performed during the hospital encounter of 01/03/18  Blood Culture (routine x 2)     Status: None   Collection Time: 01/03/18 12:06 PM   Specimen: BLOOD  Result Value Ref Range Status   Specimen Description BLOOD RIGHT ANTECUBITAL  Final   Special Requests   Final    BOTTLES DRAWN AEROBIC AND ANAEROBIC Blood Culture results may not be optimal due to an excessive volume of blood received in culture bottles   Culture   Final    NO GROWTH 5 DAYS Performed at [facility], [address], [place], [place] [address]    Report Status 01/08/2018 FINAL  Final  Blood Culture (routine x 2)     Status: None   Collection Time: 01/03/18 12:07 PM   Specimen: BLOOD RIGHT HAND  Result Value Ref Range Status   Specimen Description BLOOD RIGHT HAND  Final   Special Requests   Final    BOTTLES DRAWN AEROBIC ONLY Blood Culture results may not be optimal due to an excessive volume of blood received in culture bottles   Culture    Final    NO GROWTH 5 DAYS Performed at [facility], [address], [place], [place] [address]    Report Status 01/08/2018 FINAL  Final  MRSA PCR Screening     Status: None   Collection Time: 01/03/18  5:19 PM   Specimen: Nasopharyngeal  Result Value Ref Range Status   MRSA by PCR NEGATIVE NEGATIVE Final    Comment:        The GeneXpert MRSA Assay (FDA approved for NASAL specimens only), is one component of a comprehensive MRSA colonization surveillance program. It is not intended to diagnose MRSA infection nor to guide or monitor treatment for MRSA infections. Performed at [facility], [address], [place], [place] [address]   Surgical pcr screen     Status: Abnormal   Collection Time: 01/03/18 11:11 PM   Specimen: Nasal Mucosa; Nasal Swab  Result Value Ref Range Status   MRSA, PCR NEGATIVE NEGATIVE Final   Staphylococcus aureus POSITIVE (A) NEGATIVE Final    Comment: (NOTE) The Xpert SA Assay (FDA approved for NASAL specimens in patients 21 years of age and older), is one component of a comprehensive surveillance program. It is not intended to diagnose infection nor to guide or monitor treatment. Performed at [facility], [address] [place], [place] [address]   Culture, blood (  routine x 2)     Status: None   Collection Time: 01/07/18 10:00 PM   Specimen: BLOOD  Result Value Ref Range Status   Specimen Description BLOOD HEMODIALYSIS CATHETER  Final   Special Requests   Final    BOTTLES DRAWN AEROBIC AND ANAEROBIC Blood Culture adequate volume   Culture   Final    NO GROWTH 5 DAYS Performed at [facility], [address] [place], [place] [address]    Report Status 01/12/2018 FINAL  Final  Culture, blood (routine x 2)     Status: None   Collection Time: 01/07/18 10:10 PM   Specimen: BLOOD  Result Value Ref Range Status   Specimen Description BLOOD HEMODIALYSIS CATHETER  Final   Special Requests   Final    BOTTLES DRAWN AEROBIC  AND ANAEROBIC Blood Culture adequate volume   Culture   Final    NO GROWTH 5 DAYS Performed at [facility], [address], [place], [place] [address]    Report Status 01/12/2018 FINAL  Final    Coagulation Studies: No results for input(s): LABPROT, INR in the last 72 hours.  Urinalysis: No results for input(s): COLORURINE, LABSPEC, PHURINE, GLUCOSEU, HGBUR, BILIRUBINUR, KETONESUR, PROTEINUR, UROBILINOGEN, NITRITE, LEUKOCYTESUR in the last 72 hours.  Invalid input(s): APPERANCEUR    Imaging: DG ABDOMEN PEG TUBE LOCATION  Result Date: 11/21/2020 CLINICAL DATA:  Gastrostomy catheter adjustment EXAM: ABDOMEN - 1 VIEW COMPARISON:  None. FINDINGS: Contrast has been administered via a gastrostomy catheter. The gastrostomy catheter is present within the stomach. There is contrast in the stomach via the catheter. There are loops of borderline dilated large bowel. No small bowel dilatation. No air-fluid levels. No free air. Visualized lung bases clear. IMPRESSION: Gastrostomy catheter tip in stomach with contrast seen in the stomach. Suspect mild colonic ileus. No overt bowel obstruction evident on somewhat limited study. No free air evident. Electronically Signed   By: Bretta Bang III M.D.   On: 11/21/2020 16:50   DG CHEST PORT 1 VIEW  Result Date: 11/21/2020 CLINICAL DATA:  Respiratory failure EXAM: PORTABLE CHEST 1 VIEW COMPARISON:  February 01, 2018 FINDINGS: Tracheostomy catheter tip is 5.8 cm above the carina. Central catheter tip is in the right atrium. No pneumothorax. Focal airspace opacity noted in the left base. Lungs elsewhere clear. Heart is slightly enlarged with pulmonary vascularity normal. There is aortic atherosclerosis. No adenopathy. No bone lesions. IMPRESSION: Tube and catheter positions as described without pneumothorax. Focus of apparent pneumonia left base. Lungs elsewhere clear. Stable cardiac prominence. Aortic Atherosclerosis (ICD10-I70.0). Electronically Signed    By: Bretta Bang III M.D.   On: 11/21/2020 16:48      Assessment & Plan: Pt is a 65 y.o. male with a PMHx of ESRD on HD TTS, anemia of chronic kidney disease, hypertension, acute respiratory failure status post tracheostomy placement, seizure disorder, hyperlipidemia, diabetes mellitus type 2, history of hyperkalemia, history of coronary artery disease, peripheral vascular disease who was admitted to [facility] on 11/21/2020 for ongoing treatment of acute respiratory failure status post tracheostomy placement and ESRD.  1.  ESRD on HD.  He was on dialysis on TTS schedule previously.  We will plan to perform dialysis today.  We plan to use left IJ PermCath for purposes of dialysis.  Ultrafiltration target 1.5 to 2 kg.  2.  Acute respiratory failure.  Patient maintained on the ventilator.  Weaning protocol as per respiratory therapy and pulmonary medicine.  3.  Anemia of  chronic kidney disease.  Check hemoglobin today.  Consider Retacrit.  4.  Secondary hyperparathyroidism.  Monitor bone mineral metabolism parameters over the course of hospitalization.  5.  Thanks for consultation.

## 2020-11-22 NOTE — Consult Note (Signed)
Pulmonary Critical Care Medicine [facility] [place]  PULMONARY SERVICE  Date of Service: 11/22/2020  PULMONARY CRITICAL CARE CONSULT   Joel Hebert  WUJ:[id number]  DOB: 10-06-1955   DOA: 11/21/2020  Referring Physician: Carron Curie, MD  HPI: Joel Hebert is a 65 y.o. male seen for follow up of Acute on Chronic Respiratory Failure.  Patient with multiple medical problems including anoxic brain injury chronic bronchitis coronary artery disease diabetes mellitus hypertension came into the hospital because of altered mental status.  Patient apparently is a nursing home resident.  Patient was admitted treated with antibiotics apparently received cefepime.  The patient subsequently was found to have an elevated troponin started on heparin there was a rapid response also at the other facility renal function did seem to also deteriorate patient was intubated placed on mechanical ventilation subsequently self extubated and was not able to come off the ventilator had to have a tracheostomy.  Right now patient is on the ventilator and has been on the weaning protocol  Review of Systems:  ROS performed and is unremarkable other than noted above.  Past Medical History:  Diagnosis Date  . Coronary artery disease   . High cholesterol   . Hypertension   . MI (myocardial infarction) (HCC) 08/13/2014; 01/2016- 03/2016 X 3  . PAD (peripheral artery disease) (HCC)   . Peripheral vascular disease (HCC)   . Type II diabetes mellitus (HCC)     Past Surgical History:  Procedure Laterality Date  . ABDOMINAL AORTOGRAM W/LOWER EXTREMITY  08/14/2017  . ABDOMINAL AORTOGRAM W/LOWER EXTREMITY N/A 08/14/2017   Procedure: ABDOMINAL AORTOGRAM W/LOWER EXTREMITY;  Surgeon: Nada Libman, MD;  Location: [facility] INVASIVE CV LAB;  Service: Cardiovascular;  Laterality: N/A;  . ABDOMINAL AORTOGRAM W/LOWER EXTREMITY N/A 09/05/2017   Procedure: ABDOMINAL AORTOGRAM W/LOWER EXTREMITY;  Surgeon: Maeola Harman, MD;  Location: [facility] INVASIVE CV LAB;  Service: Cardiovascular;  Laterality: N/A;  . AMPUTATION Left 10/09/2017   Procedure: AMPUTATION DIGIT LEFT FIFTH TOE;  Surgeon: Maeola Harman, MD;  Location: [facility] OR;  Service: Vascular;  Laterality: Left;  . AMPUTATION Left 12/14/2017   Procedure: LEFT TRANSMETATARSAL AMPUTATION;  Surgeon: Nadara Mustard, MD;  Location: [facility] OR;  Service: Orthopedics;  Laterality: Left;  . AMPUTATION Left 01/04/2018   Procedure: LEFT BELOW KNEE AMPUTATION;  Surgeon: Nadara Mustard, MD;  Location:  [facility] OR;  Service: Orthopedics;  Laterality: Left;  . AMPUTATION TOE Left 10/09/2017   left foot 5th toe  . CORONARY ANGIOPLASTY WITH STENT PLACEMENT  01/2016; 03/2016   "1 + 1" (08/14/2017)  . IR FLUORO GUIDE CV LINE RIGHT  01/07/2018  . IR US GUIDE VASC ACCESS RIGHT  01/07/2018  . LOWER EXTREMITY ANGIOGRAPHY N/A 08/20/2017   Procedure: LOWER EXTREMITY ANGIOGRAPHY-rt leg;  Surgeon: Sherren Kerns, MD;  Location: [facility] INVASIVE CV LAB;  Service: Cardiovascular;  Laterality: N/A;  . PERIPHERAL VASCULAR ATHERECTOMY  08/14/2017   Procedure: PERIPHERAL VASCULAR ATHERECTOMY;  Surgeon: Nada Libman, MD;  Location: [facility] INVASIVE CV LAB;  Service: Cardiovascular;;  SFA   . PERIPHERAL VASCULAR ATHERECTOMY Left 09/05/2017   Procedure: PERIPHERAL VASCULAR ATHERECTOMY;  Surgeon: Maeola Harman, MD;  Location: [facility] INVASIVE CV LAB;  Service: Cardiovascular;  Laterality: Left;  Tibioperoneal and posterior tibial  . PERIPHERAL VASCULAR BALLOON ANGIOPLASTY  08/14/2017   Procedure: PERIPHERAL VASCULAR BALLOON ANGIOPLASTY;  Surgeon: Nada Libman, MD;  Location: [facility] INVASIVE CV LAB;  Service: Cardiovascular;;  peroneal  . PERIPHERAL VASCULAR  BALLOON ANGIOPLASTY  08/20/2017   Procedure: PERIPHERAL VASCULAR BALLOON ANGIOPLASTY;  Surgeon: Sherren Kerns, MD;  Location: [facility] INVASIVE CV LAB;  Service: Cardiovascular;;  . PERIPHERAL VASCULAR INTERVENTION  08/14/2017   Procedure:  PERIPHERAL VASCULAR INTERVENTION;  Surgeon: Nada Libman, MD;  Location: [facility] INVASIVE CV LAB;  Service: Cardiovascular;;  SFA stent  . TONSILLECTOMY    . WOUND DEBRIDEMENT Left 11/06/2017   Procedure: DEBRIDEMENT WOUND TOE BED OF FIFTH TOE LEFT FOOT;  Surgeon: Maeola Harman, MD;  Location: [facility] OR;  Service: Vascular;  Laterality: Left;    Social History:    reports that he has been smoking cigarettes. He started smoking about 46 years ago. He has a 10.75 pack-year smoking history. He has never used smokeless tobacco. He reports previous alcohol use. He reports that he does not use drugs.  Family History: Non-Contributory to the present illness  No Known Allergies  Medications: Reviewed on Rounds  Physical Exam:  Vitals: Temperature is 97.9 pulse 70 respiratory rate is 28 blood pressure 134/68 saturations 98%  Ventilator Settings on assist control FiO2 30% tidal volume is 500 PEEP 5  . General: Comfortable at this time . Eyes: Grossly normal lids, irises & conjunctiva . ENT: grossly tongue is normal . Neck: no obvious mass . Cardiovascular: S1-S2 normal no gallop or rub . Respiratory: No rhonchi very coarse breath sounds . Abdomen: Soft and nontender . Skin: no rash seen on limited exam . Musculoskeletal: not rigid . Psychiatric:unable to assess . Neurologic: no seizure no involuntary movements         Labs on Admission:  Basic Metabolic Panel: Recent Labs  Lab 11/22/20 0703  NA 132*  134*  K 3.3*  3.3*  CL 96*  96*  CO2 26  27  GLUCOSE 115*  117*  BUN 49*  49*  CREATININE 4.19*  4.20*  CALCIUM 8.7*  8.7*  PHOS 4.5    Recent Labs  Lab 11/21/20 1605  PHART 7.371  PCO2ART 45.4  PO2ART 62.0*  HCO3 25.6  O2SAT 91.2    Liver Function Tests: Recent Labs  Lab 11/22/20 0703  AST 38  ALT 25  ALKPHOS 135*  BILITOT 0.6  PROT 5.7*  ALBUMIN 2.1*  2.1*   No results for input(s): LIPASE, AMYLASE in the last 168 hours. No results for  input(s): AMMONIA in the last 168 hours.  CBC: Recent Labs  Lab 11/22/20 0703  WBC 8.4  NEUTROABS 5.6  HGB 8.1*  HCT 26.0*  MCV 93.5  PLT 332    Cardiac Enzymes: No results for input(s): CKTOTAL, CKMB, CKMBINDEX, TROPONINI in the last 168 hours.  BNP (last 3 results) No results for input(s): BNP in the last 8760 hours.  ProBNP (last 3 results) No results for input(s): PROBNP in the last 8760 hours.   Radiological Exams on Admission: DG ABDOMEN PEG TUBE LOCATION  Result Date: 11/21/2020 CLINICAL DATA:  Gastrostomy catheter adjustment EXAM: ABDOMEN - 1 VIEW COMPARISON:  None. FINDINGS: Contrast has been administered via a gastrostomy catheter. The gastrostomy catheter is present within the stomach. There is contrast in the stomach via the catheter. There are loops of borderline dilated large bowel. No small bowel dilatation. No air-fluid levels. No free air. Visualized lung bases clear. IMPRESSION: Gastrostomy catheter tip in stomach with contrast seen in the stomach. Suspect mild colonic ileus. No overt bowel obstruction evident on somewhat limited study. No free air evident. Electronically Signed   By: Bretta Bang III M.D.  On: 11/21/2020 16:50   DG CHEST PORT 1 VIEW  Result Date: 11/21/2020 CLINICAL DATA:  Respiratory failure EXAM: PORTABLE CHEST 1 VIEW COMPARISON:  February 01, 2018 FINDINGS: Tracheostomy catheter tip is 5.8 cm above the carina. Central catheter tip is in the right atrium. No pneumothorax. Focal airspace opacity noted in the left base. Lungs elsewhere clear. Heart is slightly enlarged with pulmonary vascularity normal. There is aortic atherosclerosis. No adenopathy. No bone lesions. IMPRESSION: Tube and catheter positions as described without pneumothorax. Focus of apparent pneumonia left base. Lungs elsewhere clear. Stable cardiac prominence. Aortic Atherosclerosis (ICD10-I70.0). Electronically Signed   By: Bretta Bang III M.D.   On: 11/21/2020 16:48   DG  Abd Portable 1V  Result Date: 11/22/2020 CLINICAL DATA:  65 year old male with history of ileus. EXAM: PORTABLE ABDOMEN - 1 VIEW COMPARISON:  11/21/2020 FINDINGS: Gastrostomy images in place in left upper quadrant. Mildly dilated loops of central abdominal small bowel and mild gaseous distension of the colon. no acute osseous abnormality. IMPRESSION: Nonspecific bowel gas pattern.  Ileus could appear similarly. Electronically Signed   By: Marliss Coots MD   On: 11/22/2020 16:55    Assessment/Plan Active Problems:   Acute on chronic respiratory failure with hypoxia (HCC)   Anoxic brain injury (HCC)   Acute renal failure superimposed on chronic kidney disease (HCC)   Pneumonia due to Pseudomonas (HCC)   1. Acute on chronic respiratory failure with hypoxia respiratory therapy will check the breathing trials to see if the patient is at a point where he can be weaned.  Plan is going to be to continue with supportive care 2. Anoxic brain injury appears to be no change plan is going to be to continue to monitor for any improvement.  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 3. 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 4. Pneumonia due to Pseudomonas has been treated with antibiotics we will continue to follow up on x-rays.  I have personally seen and evaluated the patient, evaluated laboratory and imaging results, formulated the assessment and plan and placed orders. The Patient requires high complexity decision making with multiple systems involvement.  Case was discussed on Rounds with the Respiratory Therapy Director and the Respiratory staff Time Spent  Yevonne Pax, MD [facility] Pulmonary Critical Care Medicine Sleep Medicine

## 2020-11-23 DIAGNOSIS — G931 Anoxic brain damage, not elsewhere classified: Secondary | ICD-10-CM | POA: Diagnosis not present

## 2020-11-23 DIAGNOSIS — N179 Acute kidney failure, unspecified: Secondary | ICD-10-CM | POA: Diagnosis not present

## 2020-11-23 DIAGNOSIS — J9621 Acute and chronic respiratory failure with hypoxia: Secondary | ICD-10-CM | POA: Diagnosis not present

## 2020-11-23 DIAGNOSIS — J151 Pneumonia due to Pseudomonas: Secondary | ICD-10-CM | POA: Diagnosis not present

## 2020-11-23 NOTE — Progress Notes (Signed)
Pulmonary Critical Care Medicine [facility] [place]   PULMONARY CRITICAL CARE SERVICE  PROGRESS NOTE     Kamauri Denardo  VOJ:[id number]  DOB: February 21, 1956   DOA: 11/21/2020  Referring Physician: Carron Curie, MD  HPI: Numan Zylstra is a 65 y.o. male seen for follow up of Acute on Chronic Respiratory Failure.  Patient right now is on pressure support mode has been on 28% FiO2 currently is on a pressure of 12/5  Medications: Reviewed on Rounds  Physical Exam:  Vitals: Temperature is 96.9 pulse 89 respiratory rate is 19 blood pressure is 142/70 saturations 92%  Ventilator Settings on pressure support FiO2 is 28% pressure 12/5  . General: Comfortable at this time . Eyes: Grossly normal lids, irises & conjunctiva . ENT: grossly tongue is normal . Neck: no obvious mass . Cardiovascular: S1 S2 normal no gallop . Respiratory: No rhonchi very coarse breath sounds . Abdomen: soft . Skin: no rash seen on limited exam . Musculoskeletal: not rigid . Psychiatric:unable to assess . Neurologic: no seizure no involuntary movements         Lab Data:   Basic Metabolic Panel: Recent Labs  Lab 11/22/20 0703  NA 132*  134*  K 3.3*  3.3*  CL 96*  96*  CO2 26  27  GLUCOSE 115*  117*  BUN 49*  49*  CREATININE 4.19*  4.20*  CALCIUM 8.7*  8.7*  PHOS 4.5    ABG: Recent Labs  Lab 11/21/20 1605  PHART 7.371  PCO2ART 45.4  PO2ART 62.0*  HCO3 25.6  O2SAT 91.2    Liver Function Tests: Recent Labs  Lab 11/22/20 0703  AST 38  ALT 25  ALKPHOS 135*  BILITOT 0.6  PROT 5.7*  ALBUMIN 2.1*  2.1*   No results for input(s): LIPASE, AMYLASE in the last 168 hours. No results for input(s): AMMONIA in the last 168 hours.  CBC: Recent Labs  Lab 11/22/20 0703  WBC 8.4  NEUTROABS 5.6  HGB 8.1*  HCT 26.0*  MCV 93.5  PLT 332    Cardiac Enzymes: No results for input(s): CKTOTAL, CKMB, CKMBINDEX, TROPONINI in the last 168 hours.  BNP (last 3  results) No results for input(s): BNP in the last 8760 hours.  ProBNP (last 3 results) No results for input(s): PROBNP in the last 8760 hours.  Radiological Exams: DG ABDOMEN PEG TUBE LOCATION  Result Date: 11/21/2020 CLINICAL DATA:  Gastrostomy catheter adjustment EXAM: ABDOMEN - 1 VIEW COMPARISON:  None. FINDINGS: Contrast has been administered via a gastrostomy catheter. The gastrostomy catheter is present within the stomach. There is contrast in the stomach via the catheter. There are loops of borderline dilated large bowel. No small bowel dilatation. No air-fluid levels. No free air. Visualized lung bases clear. IMPRESSION: Gastrostomy catheter tip in stomach with contrast seen in the stomach. Suspect mild colonic ileus. No overt bowel obstruction evident on somewhat limited study. No free air evident. Electronically Signed   By: Bretta Bang III M.D.   On: 11/21/2020 16:50   DG CHEST PORT 1 VIEW  Result Date: 11/21/2020 CLINICAL DATA:  Respiratory failure EXAM: PORTABLE CHEST 1 VIEW COMPARISON:  February 01, 2018 FINDINGS: Tracheostomy catheter tip is 5.8 cm above the carina. Central catheter tip is in the right atrium. No pneumothorax. Focal airspace opacity noted in the left base. Lungs elsewhere clear. Heart is slightly enlarged with pulmonary vascularity normal. There is aortic atherosclerosis. No adenopathy. No bone lesions. IMPRESSION: Tube and catheter  positions as described without pneumothorax. Focus of apparent pneumonia left base. Lungs elsewhere clear. Stable cardiac prominence. Aortic Atherosclerosis (ICD10-I70.0). Electronically Signed   By: Bretta Bang III M.D.   On: 11/21/2020 16:48   DG Abd Portable 1V  Result Date: 11/22/2020 CLINICAL DATA:  65 year old male with history of ileus. EXAM: PORTABLE ABDOMEN - 1 VIEW COMPARISON:  11/21/2020 FINDINGS: Gastrostomy images in place in left upper quadrant. Mildly dilated loops of central abdominal small bowel and mild gaseous  distension of the colon. no acute osseous abnormality. IMPRESSION: Nonspecific bowel gas pattern.  Ileus could appear similarly. Electronically Signed   By: Marliss Coots MD   On: 11/22/2020 16:55    Assessment/Plan Active Problems:   Acute on chronic respiratory failure with hypoxia (HCC)   Anoxic brain injury (HCC)   Acute renal failure superimposed on chronic kidney disease (HCC)   Pneumonia due to Pseudomonas (HCC)   1. Acute on chronic respiratory failure with hypoxia the plan is going to be to continue with the wean goal of 4 hours on pressure support wean will advance as tolerated. 2. Anoxic brain injury no change we will continue to monitor closely. 3. Acute renal failure patient is at baseline. 4. Pneumonia due to Pseudomonas has been treated by antibiotics   I have personally seen and evaluated the patient, evaluated laboratory and imaging results, formulated the assessment and plan and placed orders. The Patient requires high complexity decision making with multiple systems involvement.  Rounds were done with the Respiratory Therapy Director and Staff therapists and discussed with nursing staff also.  Yevonne Pax, MD [facility] Pulmonary Critical Care Medicine Sleep Medicine

## 2020-11-24 DIAGNOSIS — G931 Anoxic brain damage, not elsewhere classified: Secondary | ICD-10-CM | POA: Diagnosis not present

## 2020-11-24 DIAGNOSIS — J151 Pneumonia due to Pseudomonas: Secondary | ICD-10-CM | POA: Diagnosis not present

## 2020-11-24 DIAGNOSIS — N179 Acute kidney failure, unspecified: Secondary | ICD-10-CM | POA: Diagnosis present

## 2020-11-24 DIAGNOSIS — J9621 Acute and chronic respiratory failure with hypoxia: Secondary | ICD-10-CM | POA: Diagnosis present

## 2020-11-24 DIAGNOSIS — N189 Chronic kidney disease, unspecified: Secondary | ICD-10-CM | POA: Diagnosis present

## 2020-11-24 LAB — CBC
HCT: 24.4 % — ABNORMAL LOW (ref 39.0–52.0)
Hemoglobin: 7.5 g/dL — ABNORMAL LOW (ref 13.0–17.0)
MCH: 29.4 pg (ref 26.0–34.0)
MCHC: 30.7 g/dL (ref 30.0–36.0)
MCV: 95.7 fL (ref 80.0–100.0)
Platelets: 325 10*3/uL (ref 150–400)
RBC: 2.55 MIL/uL — ABNORMAL LOW (ref 4.22–5.81)
RDW: 18.6 % — ABNORMAL HIGH (ref 11.5–15.5)
WBC: 8.9 10*3/uL (ref 4.0–10.5)
nRBC: 0 % (ref 0.0–0.2)

## 2020-11-24 LAB — RENAL FUNCTION PANEL
Albumin: 2 g/dL — ABNORMAL LOW (ref 3.5–5.0)
Anion gap: 11 (ref 5–15)
BUN: 48 mg/dL — ABNORMAL HIGH (ref 8–23)
CO2: 26 mmol/L (ref 22–32)
Calcium: 9.1 mg/dL (ref 8.9–10.3)
Chloride: 98 mmol/L (ref 98–111)
Creatinine, Ser: 4.45 mg/dL — ABNORMAL HIGH (ref 0.61–1.24)
GFR, Estimated: 14 mL/min — ABNORMAL LOW (ref >60)
Glucose, Bld: 130 mg/dL — ABNORMAL HIGH (ref 70–99)
Phosphorus: 4.1 mg/dL (ref 2.5–4.6)
Potassium: 3.7 mmol/L (ref 3.5–5.1)
Sodium: 135 mmol/L (ref 135–145)

## 2020-11-24 NOTE — Progress Notes (Signed)
Pulmonary Critical Care Medicine [facility] [place]   PULMONARY CRITICAL CARE SERVICE  PROGRESS NOTE     Joel Hebert  PCH:[id number]  DOB: Apr 11, 1956   DOA: 11/21/2020  Referring Physician: Carron Curie, MD  HPI: Joel Hebert is a 65 y.o. male seen for follow up of Acute on Chronic Respiratory Failure.  Patient currently is on pressure support has been on a pressure of 12/5  Medications: Reviewed on Rounds  Physical Exam:  Vitals: Temperature is 98.8 pulse 77 respiratory 21 blood pressure is 140/66 saturations 98%  Ventilator Settings on pressure support 12/5  . General: Comfortable at this time . Eyes: Grossly normal lids, irises & conjunctiva . ENT: grossly tongue is normal . Neck: no obvious mass . Cardiovascular: S1 S2 normal no gallop . Respiratory: No rhonchi very coarse breath sound . Abdomen: soft . Skin: no rash seen on limited exam . Musculoskeletal: not rigid . Psychiatric:unable to assess . Neurologic: no seizure no involuntary movements         Lab Data:   Basic Metabolic Panel: Recent Labs  Lab 11/22/20 0703 11/24/20 0831  NA 132*  134* 135  K 3.3*  3.3* 3.7  CL 96*  96* 98  CO2 26  27 26   GLUCOSE 115*  117* 130*  BUN 49*  49* 48*  CREATININE 4.19*  4.20* 4.45*  CALCIUM 8.7*  8.7* 9.1  PHOS 4.5 4.1    ABG: Recent Labs  Lab 11/21/20 1605  PHART 7.371  PCO2ART 45.4  PO2ART 62.0*  HCO3 25.6  O2SAT 91.2    Liver Function Tests: Recent Labs  Lab 11/22/20 0703 11/24/20 0831  AST 38  --   ALT 25  --   ALKPHOS 135*  --   BILITOT 0.6  --   PROT 5.7*  --   ALBUMIN 2.1*  2.1* 2.0*   No results for input(s): LIPASE, AMYLASE in the last 168 hours. No results for input(s): AMMONIA in the last 168 hours.  CBC: Recent Labs  Lab 11/22/20 0703 11/24/20 0831  WBC 8.4 8.9  NEUTROABS 5.6  --   HGB 8.1* 7.5*  HCT 26.0* 24.4*  MCV 93.5 95.7  PLT 332 325    Cardiac Enzymes: No results for  input(s): CKTOTAL, CKMB, CKMBINDEX, TROPONINI in the last 168 hours.  BNP (last 3 results) No results for input(s): BNP in the last 8760 hours.  ProBNP (last 3 results) No results for input(s): PROBNP in the last 8760 hours.  Radiological Exams: DG Abd Portable 1V  Result Date: 11/22/2020 CLINICAL DATA:  65 year old male with history of ileus. EXAM: PORTABLE ABDOMEN - 1 VIEW COMPARISON:  11/21/2020 FINDINGS: Gastrostomy images in place in left upper quadrant. Mildly dilated loops of central abdominal small bowel and mild gaseous distension of the colon. no acute osseous abnormality. IMPRESSION: Nonspecific bowel gas pattern.  Ileus could appear similarly. Electronically Signed   By: 11/23/2020 MD   On: 11/22/2020 16:55    Assessment/Plan Active Problems:   Acute on chronic respiratory failure with hypoxia (HCC)   Anoxic brain injury (HCC)   Acute renal failure superimposed on chronic kidney disease (HCC)   Pneumonia due to Pseudomonas (HCC)   1. Acute on chronic respiratory failure hypoxia we will continue with weaning as tolerated on the pressure support.  Continue pulmonary toileting supportive care. 2. Anoxic brain injury no change 3. Acute renal failure on chronic renal failure being seen by nephrology 4. Pneumonia due to Pseudomonas  treated   I have personally seen and evaluated the patient, evaluated laboratory and imaging results, formulated the assessment and plan and placed orders. The Patient requires high complexity decision making with multiple systems involvement.  Rounds were done with the Respiratory Therapy Director and Staff therapists and discussed with nursing staff also.  Yevonne Pax, MD [facility] Pulmonary Critical Care Medicine Sleep Medicine

## 2020-11-24 NOTE — Progress Notes (Signed)
[facility]  ROUNDING NOTE   Subjective:  Patient seen and evaluated at bedside during hemodialysis treatment. Appears to be tolerating well. Ultrafiltration target 2 kg.   Objective:  Vital signs in last 24 hours:  Temperature 98.8 pulse 77 respirations 21 blood pressure 148/66  Physical Exam: General:  No acute distress  Head:  Normocephalic, atraumatic. Moist oral mucosal membranes  Eyes:  Anicteric  Neck:  tracheostomy in place  Lungs:   Clear to auscultation, normal effort  Heart:  S1S2 no rubs  Abdomen:   Soft, nontender, bowel sounds present  Extremities:  Left BKA.  Neurologic:  Awake, alert, following commands  Skin:  No lesions  Access:  Left IJ PermCath    Basic Metabolic Panel: Recent Labs  Lab 11/22/20 0703 11/24/20 0831  NA 132*  134* 135  K 3.3*  3.3* 3.7  CL 96*  96* 98  CO2 26  27 26   GLUCOSE 115*  117* 130*  BUN 49*  49* 48*  CREATININE 4.19*  4.20* 4.45*  CALCIUM 8.7*  8.7* 9.1  PHOS 4.5 4.1    Liver Function Tests: Recent Labs  Lab 11/22/20 0703 11/24/20 0831  AST 38  --   ALT 25  --   ALKPHOS 135*  --   BILITOT 0.6  --   PROT 5.7*  --   ALBUMIN 2.1*  2.1* 2.0*   No results for input(s): LIPASE, AMYLASE in the last 168 hours. No results for input(s): AMMONIA in the last 168 hours.  CBC: Recent Labs  Lab 11/22/20 0703 11/24/20 0831  WBC 8.4 8.9  NEUTROABS 5.6  --   HGB 8.1* 7.5*  HCT 26.0* 24.4*  MCV 93.5 95.7  PLT 332 325    Cardiac Enzymes: No results for input(s): CKTOTAL, CKMB, CKMBINDEX, TROPONINI in the last 168 hours.  BNP: Invalid input(s): POCBNP  CBG: No results for input(s): GLUCAP in the last 168 hours.  Microbiology: Results for orders placed or performed during the hospital encounter of 01/03/18  Blood Culture (routine x 2)     Status: None   Collection Time: 01/03/18 12:06 PM   Specimen: BLOOD  Result Value Ref Range Status   Specimen Description BLOOD RIGHT ANTECUBITAL   Final   Special Requests   Final    BOTTLES DRAWN AEROBIC AND ANAEROBIC Blood Culture results may not be optimal due to an excessive volume of blood received in culture bottles   Culture   Final    NO GROWTH 5 DAYS Performed at [facility], [address], [place], [place]    Report Status 01/08/2018 FINAL  Final  Blood Culture (routine x 2)     Status: None   Collection Time: 01/03/18 12:07 PM   Specimen: BLOOD RIGHT HAND  Result Value Ref Range Status   Specimen Description BLOOD RIGHT HAND  Final   Special Requests   Final    BOTTLES DRAWN AEROBIC ONLY Blood Culture results may not be optimal due to an excessive volume of blood received in culture bottles   Culture   Final    NO GROWTH 5 DAYS Performed at [facility], [address] [place], [place]    Report Status 01/08/2018 FINAL  Final  MRSA PCR Screening     Status: None   Collection Time: 01/03/18  5:19 PM   Specimen: Nasopharyngeal  Result Value Ref Range Status   MRSA by PCR NEGATIVE NEGATIVE Final    Comment:  The GeneXpert MRSA Assay (FDA approved for NASAL specimens only), is one component of a comprehensive MRSA colonization surveillance program. It is not intended to diagnose MRSA infection nor to guide or monitor treatment for MRSA infections. Performed at [facility], [address] [place], [place] [address]   Surgical pcr screen     Status: Abnormal   Collection Time: 01/03/18 11:11 PM   Specimen: Nasal Mucosa; Nasal Swab  Result Value Ref Range Status   MRSA, PCR NEGATIVE NEGATIVE Final   Staphylococcus aureus POSITIVE (A) NEGATIVE Final    Comment: (NOTE) The Xpert SA Assay (FDA approved for NASAL specimens in patients 66 years of age and older), is one component of a comprehensive surveillance program. It is not intended to diagnose infection nor to guide or monitor treatment. Performed at [facility], [address], [place],  [place] [address]   Culture, blood (routine x 2)     Status: None   Collection Time: 01/07/18 10:00 PM   Specimen: BLOOD  Result Value Ref Range Status   Specimen Description BLOOD HEMODIALYSIS CATHETER  Final   Special Requests   Final    BOTTLES DRAWN AEROBIC AND ANAEROBIC Blood Culture adequate volume   Culture   Final    NO GROWTH 5 DAYS Performed at [facility], [address], [place], [place] [address]    Report Status 01/12/2018 FINAL  Final  Culture, blood (routine x 2)     Status: None   Collection Time: 01/07/18 10:10 PM   Specimen: BLOOD  Result Value Ref Range Status   Specimen Description BLOOD HEMODIALYSIS CATHETER  Final   Special Requests   Final    BOTTLES DRAWN AEROBIC AND ANAEROBIC Blood Culture adequate volume   Culture   Final    NO GROWTH 5 DAYS Performed at [facility], [address], [place], [place] [address]    Report Status 01/12/2018 FINAL  Final    Coagulation Studies: No results for input(s): LABPROT, INR in the last 72 hours.  Urinalysis: No results for input(s): COLORURINE, LABSPEC, PHURINE, GLUCOSEU, HGBUR, BILIRUBINUR, KETONESUR, PROTEINUR, UROBILINOGEN, NITRITE, LEUKOCYTESUR in the last 72 hours.  Invalid input(s): APPERANCEUR    Imaging: DG Abd Portable 1V  Result Date: 11/22/2020 CLINICAL DATA:  65 year old male with history of ileus. EXAM: PORTABLE ABDOMEN - 1 VIEW COMPARISON:  11/21/2020 FINDINGS: Gastrostomy images in place in left upper quadrant. Mildly dilated loops of central abdominal small bowel and mild gaseous distension of the colon. no acute osseous abnormality. IMPRESSION: Nonspecific bowel gas pattern.  Ileus could appear similarly. Electronically Signed   By: Marliss Coots MD   On: 11/22/2020 16:55     Medications:       Assessment/ Plan:  65 y.o. male with a PMHx of ESRD on HD TTS, anemia of chronic kidney disease, hypertension, acute respiratory failure status post tracheostomy placement, seizure  disorder, hyperlipidemia, diabetes mellitus type 2, history of hyperkalemia, history of coronary artery disease, peripheral vascular disease who was admitted to [facility] on 11/21/2020 for ongoing treatment of acute respiratory failure status post tracheostomy placement and ESRD.  1.  ESRD on HD.    Patient seen and evaluated during hemodialysis.  Tolerating well.  Ultrafiltration target 2 kg today.  Continue dialysis on MWF schedule.  2.  Acute respiratory failure.  Pulmonary medicine following.  Weaning protocol as per respiratory therapy.  3.  Anemia of chronic kidney disease.  Hemoglobin down to 7.5.  Consider blood transfusion for hemoglobin of 7 or less.  4.  Secondary hyperparathyroidism.  Phosphorus currently 4.1 and acceptable.   LOS: 0 Charlcie Prisco 5/18/20221:01 PM

## 2020-11-25 ENCOUNTER — Other Ambulatory Visit (HOSPITAL_COMMUNITY): Payer: Medicare Other

## 2020-11-25 DIAGNOSIS — J151 Pneumonia due to Pseudomonas: Secondary | ICD-10-CM | POA: Diagnosis not present

## 2020-11-25 DIAGNOSIS — N179 Acute kidney failure, unspecified: Secondary | ICD-10-CM | POA: Diagnosis not present

## 2020-11-25 DIAGNOSIS — J9621 Acute and chronic respiratory failure with hypoxia: Secondary | ICD-10-CM | POA: Diagnosis not present

## 2020-11-25 DIAGNOSIS — G931 Anoxic brain damage, not elsewhere classified: Secondary | ICD-10-CM | POA: Diagnosis not present

## 2020-11-25 LAB — CBC
HCT: 23.4 % — ABNORMAL LOW (ref 39.0–52.0)
Hemoglobin: 7.3 g/dL — ABNORMAL LOW (ref 13.0–17.0)
MCH: 30 pg (ref 26.0–34.0)
MCHC: 31.2 g/dL (ref 30.0–36.0)
MCV: 96.3 fL (ref 80.0–100.0)
Platelets: 335 10*3/uL (ref 150–400)
RBC: 2.43 MIL/uL — ABNORMAL LOW (ref 4.22–5.81)
RDW: 18.7 % — ABNORMAL HIGH (ref 11.5–15.5)
WBC: 7.7 10*3/uL (ref 4.0–10.5)
nRBC: 0.3 % — ABNORMAL HIGH (ref 0.0–0.2)

## 2020-11-25 NOTE — Progress Notes (Signed)
Pulmonary Critical Care Medicine [facility] [place]   PULMONARY CRITICAL CARE SERVICE  PROGRESS NOTE     Joel Hebert  IPJ:[id number]  DOB: 10/23/1955   DOA: 11/21/2020  Referring Physician: Carron Curie, MD  HPI: Joel Hebert is a 65 y.o. male seen for follow up of Acute on Chronic Respiratory Failure.  Patient right now is on pressure support has been on a pressure of 12/5 and the goal today is for 12 hours  Medications: Reviewed on Rounds  Physical Exam:  Vitals: Temperature is 101.2 pulse 92 respiratory rate was 30 blood pressure 121/89 saturations 100%  Ventilator Settings pressure support pressure 12/5  . General: Comfortable at this time . Eyes: Grossly normal lids, irises & conjunctiva . ENT: grossly tongue is normal . Neck: no obvious mass . Cardiovascular: S1 S2 normal no gallop . Respiratory: Scattered rhonchi expansion is equal . Abdomen: soft . Skin: no rash seen on limited exam . Musculoskeletal: not rigid . Psychiatric:unable to assess . Neurologic: no seizure no involuntary movements         Lab Data:   Basic Metabolic Panel: Recent Labs  Lab 11/22/20 0703 11/24/20 0831  NA 132*  134* 135  K 3.3*  3.3* 3.7  CL 96*  96* 98  CO2 26  27 26   GLUCOSE 115*  117* 130*  BUN 49*  49* 48*  CREATININE 4.19*  4.20* 4.45*  CALCIUM 8.7*  8.7* 9.1  PHOS 4.5 4.1    ABG: Recent Labs  Lab 11/21/20 1605  PHART 7.371  PCO2ART 45.4  PO2ART 62.0*  HCO3 25.6  O2SAT 91.2    Liver Function Tests: Recent Labs  Lab 11/22/20 0703 11/24/20 0831  AST 38  --   ALT 25  --   ALKPHOS 135*  --   BILITOT 0.6  --   PROT 5.7*  --   ALBUMIN 2.1*  2.1* 2.0*   No results for input(s): LIPASE, AMYLASE in the last 168 hours. No results for input(s): AMMONIA in the last 168 hours.  CBC: Recent Labs  Lab 11/22/20 0703 11/24/20 0831 11/25/20 0422  WBC 8.4 8.9 7.7  NEUTROABS 5.6  --   --   HGB 8.1* 7.5* 7.3*  HCT 26.0*  24.4* 23.4*  MCV 93.5 95.7 96.3  PLT 332 325 335    Cardiac Enzymes: No results for input(s): CKTOTAL, CKMB, CKMBINDEX, TROPONINI in the last 168 hours.  BNP (last 3 results) No results for input(s): BNP in the last 8760 hours.  ProBNP (last 3 results) No results for input(s): PROBNP in the last 8760 hours.  Radiological Exams: DG Abd Portable 1V  Result Date: 11/25/2020 CLINICAL DATA:  Ileus EXAM: PORTABLE ABDOMEN - 1 VIEW COMPARISON:  11/22/2020 FINDINGS: Gas is seen within a nondistended transverse colon. Normal abdominal gas pattern. Gastrostomy catheter overlies the expected gastric lumen. No free intraperitoneal gas. Left common femoral central venous catheter tip noted within the expected juxtarenal inferior vena cava. No organomegaly. Vascular calcifications are seen within the pelvis bilaterally. IMPRESSION: Normal abdominal gas pattern. Electronically Signed   By: 11/24/2020 MD   On: 11/25/2020 05:34    Assessment/Plan Active Problems:   Acute on chronic respiratory failure with hypoxia (HCC)   Anoxic brain injury (HCC)   Acute renal failure superimposed on chronic kidney disease (HCC)   Pneumonia due to Pseudomonas (HCC)   1. Acute on chronic respiratory failure with hypoxia patient did spike a fever as above this will  be worked up however patient is still tolerating the weaning so therefore we will continue to gradually advance the weaning today's goal is for 12 hours 2. Anoxic brain injury overall no change he is nonverbal 3. Acute renal failure on chronic renal failure nephrology is following along 4. Pneumonia due to Pseudomonas has been treated we will probably need follow-up x-ray films to reassess   I have personally seen and evaluated the patient, evaluated laboratory and imaging results, formulated the assessment and plan and placed orders. The Patient requires high complexity decision making with multiple systems involvement.  Rounds were done with the  Respiratory Therapy Director and Staff therapists and discussed with nursing staff also.  Yevonne Pax, MD [facility] Pulmonary Critical Care Medicine Sleep Medicine

## 2020-11-26 DIAGNOSIS — N179 Acute kidney failure, unspecified: Secondary | ICD-10-CM | POA: Diagnosis not present

## 2020-11-26 DIAGNOSIS — J151 Pneumonia due to Pseudomonas: Secondary | ICD-10-CM | POA: Diagnosis not present

## 2020-11-26 DIAGNOSIS — J9621 Acute and chronic respiratory failure with hypoxia: Secondary | ICD-10-CM | POA: Diagnosis not present

## 2020-11-26 DIAGNOSIS — G931 Anoxic brain damage, not elsewhere classified: Secondary | ICD-10-CM | POA: Diagnosis not present

## 2020-11-26 LAB — CBC
HCT: 20.3 % — ABNORMAL LOW (ref 39.0–52.0)
Hemoglobin: 6.1 g/dL — CL (ref 13.0–17.0)
MCH: 29.6 pg (ref 26.0–34.0)
MCHC: 30 g/dL (ref 30.0–36.0)
MCV: 98.5 fL (ref 80.0–100.0)
Platelets: 252 10*3/uL (ref 150–400)
RBC: 2.06 MIL/uL — ABNORMAL LOW (ref 4.22–5.81)
RDW: 19.5 % — ABNORMAL HIGH (ref 11.5–15.5)
WBC: 9.3 10*3/uL (ref 4.0–10.5)
nRBC: 0.2 % (ref 0.0–0.2)

## 2020-11-26 LAB — RENAL FUNCTION PANEL
Albumin: 2 g/dL — ABNORMAL LOW (ref 3.5–5.0)
Anion gap: 10 (ref 5–15)
BUN: 49 mg/dL — ABNORMAL HIGH (ref 8–23)
CO2: 26 mmol/L (ref 22–32)
Calcium: 8.7 mg/dL — ABNORMAL LOW (ref 8.9–10.3)
Chloride: 101 mmol/L (ref 98–111)
Creatinine, Ser: 4.76 mg/dL — ABNORMAL HIGH (ref 0.61–1.24)
GFR, Estimated: 13 mL/min — ABNORMAL LOW (ref >60)
Glucose, Bld: 104 mg/dL — ABNORMAL HIGH (ref 70–99)
Phosphorus: 4.4 mg/dL (ref 2.5–4.6)
Potassium: 4.5 mmol/L (ref 3.5–5.1)
Sodium: 137 mmol/L (ref 135–145)

## 2020-11-26 LAB — PREPARE RBC (CROSSMATCH)

## 2020-11-26 NOTE — Progress Notes (Signed)
[facility]  ROUNDING NOTE   Subjective:  Patient due for hemodialysis treatment today. Potassium currently separable at 4.5.    Objective:  Vital signs in last 24 hours:  Temperature 98.5 pulse 97 respiration 16 blood pressure 135/61  Physical Exam: General:  No acute distress  Head:  Normocephalic, atraumatic. Moist oral mucosal membranes  Eyes:  Anicteric  Neck:  tracheostomy in place  Lungs:   Clear to auscultation, normal effort  Heart:  S1S2 no rubs  Abdomen:   Soft, nontender, bowel sounds present  Extremities:  Left BKA.  Neurologic:  Awake, alert, following commands  Skin:  No lesions  Access:  Left IJ PermCath    Basic Metabolic Panel: Recent Labs  Lab 11/22/20 0703 11/24/20 0831 11/26/20 0555  NA 132*  134* 135 137  K 3.3*  3.3* 3.7 4.5  CL 96*  96* 98 101  CO2 26  27 26 26   GLUCOSE 115*  117* 130* 104*  BUN 49*  49* 48* 49*  CREATININE 4.19*  4.20* 4.45* 4.76*  CALCIUM 8.7*  8.7* 9.1 8.7*  PHOS 4.5 4.1 4.4    Liver Function Tests: Recent Labs  Lab 11/22/20 0703 11/24/20 0831 11/26/20 0555  AST 38  --   --   ALT 25  --   --   ALKPHOS 135*  --   --   BILITOT 0.6  --   --   PROT 5.7*  --   --   ALBUMIN 2.1*  2.1* 2.0* 2.0*   No results for input(s): LIPASE, AMYLASE in the last 168 hours. No results for input(s): AMMONIA in the last 168 hours.  CBC: Recent Labs  Lab 11/22/20 0703 11/24/20 0831 11/25/20 0422 11/26/20 0555  WBC 8.4 8.9 7.7 9.3  NEUTROABS 5.6  --   --   --   HGB 8.1* 7.5* 7.3* 6.1*  HCT 26.0* 24.4* 23.4* 20.3*  MCV 93.5 95.7 96.3 98.5  PLT 332 325 335 252    Cardiac Enzymes: No results for input(s): CKTOTAL, CKMB, CKMBINDEX, TROPONINI in the last 168 hours.  BNP: Invalid input(s): POCBNP  CBG: No results for input(s): GLUCAP in the last 168 hours.  Microbiology: Results for orders placed or performed during the hospital encounter of 11/21/20  Culture, blood (routine x 2)     Status: None  (Preliminary result)   Collection Time: 11/25/20  3:25 PM   Specimen: BLOOD RIGHT HAND  Result Value Ref Range Status   Specimen Description BLOOD RIGHT HAND  Final   Special Requests   Final    BOTTLES DRAWN AEROBIC ONLY Blood Culture results may not be optimal due to an inadequate volume of blood received in culture bottles   Culture   Final    NO GROWTH < 24 HOURS Performed at [facility], [address] [place], [place]    Report Status PENDING  Incomplete  Culture, blood (routine x 2)     Status: None (Preliminary result)   Collection Time: 11/25/20  3:29 PM   Specimen: BLOOD LEFT HAND  Result Value Ref Range Status   Specimen Description BLOOD LEFT HAND  Final   Special Requests   Final    BOTTLES DRAWN AEROBIC AND ANAEROBIC Blood Culture adequate volume   Culture   Final    NO GROWTH < 24 HOURS Performed at [facility], [address], [place], [place]    Report Status PENDING  Incomplete  Coagulation Studies: No results for input(s): LABPROT, INR in the last 72 hours.  Urinalysis: No results for input(s): COLORURINE, LABSPEC, PHURINE, GLUCOSEU, HGBUR, BILIRUBINUR, KETONESUR, PROTEINUR, UROBILINOGEN, NITRITE, LEUKOCYTESUR in the last 72 hours.  Invalid input(s): APPERANCEUR    Imaging: DG Abd Portable 1V  Result Date: 11/25/2020 CLINICAL DATA:  Ileus EXAM: PORTABLE ABDOMEN - 1 VIEW COMPARISON:  11/22/2020 FINDINGS: Gas is seen within a nondistended transverse colon. Normal abdominal gas pattern. Gastrostomy catheter overlies the expected gastric lumen. No free intraperitoneal gas. Left common femoral central venous catheter tip noted within the expected juxtarenal inferior vena cava. No organomegaly. Vascular calcifications are seen within the pelvis bilaterally. IMPRESSION: Normal abdominal gas pattern. Electronically Signed   By: Helyn Numbers MD   On: 11/25/2020 05:34     Medications:       Assessment/ Plan:  65 y.o.  male with a PMHx of ESRD on HD TTS, anemia of chronic kidney disease, hypertension, acute respiratory failure status post tracheostomy placement, seizure disorder, hyperlipidemia, diabetes mellitus type 2, history of hyperkalemia, history of coronary artery disease, peripheral vascular disease who was admitted to [facility] on 11/21/2020 for ongoing treatment of acute respiratory failure status post tracheostomy placement and ESRD.  1.  ESRD on HD.    Patient due for hemodialysis treatment today.  Orders have been prepared.  2.  Acute respiratory failure.  Weaning as per pulmonary/critical care.  3.  Anemia of chronic kidney disease.  Hemoglobin down to 6.1.  Patient received blood transfusion during dialysis treatment today.  4.  Secondary hyperparathyroidism.  Phosphorus acceptable at 4.1.  Continue to monitor.   LOS: 0 Carolos Fecher 5/20/20224:32 PM

## 2020-11-26 NOTE — Progress Notes (Signed)
Pulmonary Critical Care Medicine [facility] [place]   PULMONARY CRITICAL CARE SERVICE  PROGRESS NOTE     Joel Hebert  XFG:[id number]  DOB: Sep 05, 1955   DOA: 11/21/2020  Referring Physician: Carron Curie, MD  HPI: Shaman Muscarella is a 65 y.o. male seen for follow up of Acute on Chronic Respiratory Failure.  Patient had a drop in hemoglobin requiring blood.  Right now is afebrile has been on the ventilator and full support  Medications: Reviewed on Rounds  Physical Exam:  Vitals: Temperature is 98.5 pulse 77 respiratory 16 blood pressure is 155/81 saturations 100%  Ventilator Settings on assist control mode on 28% FiO2 tidal volume of 500 PEEP 5  . General: Comfortable at this time . Eyes: Grossly normal lids, irises & conjunctiva . ENT: grossly tongue is normal . Neck: no obvious mass . Cardiovascular: S1 S2 normal no gallop . Respiratory: No rhonchi very coarse breath sounds . Abdomen: soft . Skin: no rash seen on limited exam . Musculoskeletal: not rigid . Psychiatric:unable to assess . Neurologic: no seizure no involuntary movements         Lab Data:   Basic Metabolic Panel: Recent Labs  Lab 11/22/20 0703 11/24/20 0831 11/26/20 0555  NA 132*  134* 135 137  K 3.3*  3.3* 3.7 4.5  CL 96*  96* 98 101  CO2 26  27 26 26   GLUCOSE 115*  117* 130* 104*  BUN 49*  49* 48* 49*  CREATININE 4.19*  4.20* 4.45* 4.76*  CALCIUM 8.7*  8.7* 9.1 8.7*  PHOS 4.5 4.1 4.4    ABG: Recent Labs  Lab 11/21/20 1605  PHART 7.371  PCO2ART 45.4  PO2ART 62.0*  HCO3 25.6  O2SAT 91.2    Liver Function Tests: Recent Labs  Lab 11/22/20 0703 11/24/20 0831 11/26/20 0555  AST 38  --   --   ALT 25  --   --   ALKPHOS 135*  --   --   BILITOT 0.6  --   --   PROT 5.7*  --   --   ALBUMIN 2.1*  2.1* 2.0* 2.0*   No results for input(s): LIPASE, AMYLASE in the last 168 hours. No results for input(s): AMMONIA in the last 168 hours.  CBC: Recent  Labs  Lab 11/22/20 0703 11/24/20 0831 11/25/20 0422 11/26/20 0555  WBC 8.4 8.9 7.7 9.3  NEUTROABS 5.6  --   --   --   HGB 8.1* 7.5* 7.3* 6.1*  HCT 26.0* 24.4* 23.4* 20.3*  MCV 93.5 95.7 96.3 98.5  PLT 332 325 335 252    Cardiac Enzymes: No results for input(s): CKTOTAL, CKMB, CKMBINDEX, TROPONINI in the last 168 hours.  BNP (last 3 results) No results for input(s): BNP in the last 8760 hours.  ProBNP (last 3 results) No results for input(s): PROBNP in the last 8760 hours.  Radiological Exams: DG Abd Portable 1V  Result Date: 11/25/2020 CLINICAL DATA:  Ileus EXAM: PORTABLE ABDOMEN - 1 VIEW COMPARISON:  11/22/2020 FINDINGS: Gas is seen within a nondistended transverse colon. Normal abdominal gas pattern. Gastrostomy catheter overlies the expected gastric lumen. No free intraperitoneal gas. Left common femoral central venous catheter tip noted within the expected juxtarenal inferior vena cava. No organomegaly. Vascular calcifications are seen within the pelvis bilaterally. IMPRESSION: Normal abdominal gas pattern. Electronically Signed   By: 11/24/2020 MD   On: 11/25/2020 05:34    Assessment/Plan Active Problems:   Acute on chronic respiratory  failure with hypoxia (HCC)   Anoxic brain injury (HCC)   Acute renal failure superimposed on chronic kidney disease (HCC)   Pneumonia due to Pseudomonas (HCC)   1. Acute on chronic respiratory failure hypoxia we will continue with Tylenol full support on the ventilator.  Patient's hemoglobin was down to 6.1 patient will be transfused.  We will continue to monitor closely. 2. Anoxic brain injury no change supportive care 3. Acute renal failure following up on labs nephrology is also following the patient along 4. Pneumonia due to Pseudomonas has been treated   I have personally seen and evaluated the patient, evaluated laboratory and imaging results, formulated the assessment and plan and placed orders. The Patient requires high  complexity decision making with multiple systems involvement.  Rounds were done with the Respiratory Therapy Director and Staff therapists and discussed with nursing staff also.  Yevonne Pax, MD [facility] Pulmonary Critical Care Medicine Sleep Medicine

## 2020-11-27 LAB — HEMOGLOBIN AND HEMATOCRIT, BLOOD
HCT: 29.9 % — ABNORMAL LOW (ref 39.0–52.0)
Hemoglobin: 9.6 g/dL — ABNORMAL LOW (ref 13.0–17.0)

## 2020-11-28 DIAGNOSIS — J151 Pneumonia due to Pseudomonas: Secondary | ICD-10-CM | POA: Diagnosis not present

## 2020-11-28 DIAGNOSIS — N179 Acute kidney failure, unspecified: Secondary | ICD-10-CM | POA: Diagnosis not present

## 2020-11-28 DIAGNOSIS — G931 Anoxic brain damage, not elsewhere classified: Secondary | ICD-10-CM | POA: Diagnosis not present

## 2020-11-28 DIAGNOSIS — J9621 Acute and chronic respiratory failure with hypoxia: Secondary | ICD-10-CM | POA: Diagnosis not present

## 2020-11-28 LAB — TYPE AND SCREEN
ABO/RH(D): O POS
Antibody Screen: NEGATIVE
Unit division: 0

## 2020-11-28 LAB — BPAM RBC
Blood Product Expiration Date: 202206152359
ISSUE DATE / TIME: 202205210155
Unit Type and Rh: 5100

## 2020-11-28 NOTE — Progress Notes (Signed)
Pulmonary Critical Care Medicine [facility] [place]   PULMONARY CRITICAL CARE SERVICE  PROGRESS NOTE     Nichollas Perusse  ION:[id number]  DOB: February 27, 1956   DOA: 11/21/2020  Referring Physician: Carron Curie, MD  HPI: Eustace Hur is a 65 y.o. male seen for follow up of Acute on Chronic Respiratory Failure.  Patient is on pressure support has been on 28% FiO2 doing well  Medications: Reviewed on Rounds  Physical Exam:  Vitals: Temperature is 99.2 pulse 75 respiratory 15 blood pressure is 144/71 saturations 100%  Ventilator Settings on pressure support FiO2 28% pressure 12/5  . General: Comfortable at this time . Eyes: Grossly normal lids, irises & conjunctiva . ENT: grossly tongue is normal . Neck: no obvious mass . Cardiovascular: S1 S2 normal no gallop . Respiratory: Scattered rhonchi expansion is equal at this time . Abdomen: soft . Skin: no rash seen on limited exam . Musculoskeletal: not rigid . Psychiatric:unable to assess . Neurologic: no seizure no involuntary movements         Lab Data:   Basic Metabolic Panel: Recent Labs  Lab 11/22/20 0703 11/24/20 0831 11/26/20 0555  NA 132*  134* 135 137  K 3.3*  3.3* 3.7 4.5  CL 96*  96* 98 101  CO2 26  27 26 26   GLUCOSE 115*  117* 130* 104*  BUN 49*  49* 48* 49*  CREATININE 4.19*  4.20* 4.45* 4.76*  CALCIUM 8.7*  8.7* 9.1 8.7*  PHOS 4.5 4.1 4.4    ABG: Recent Labs  Lab 11/21/20 1605  PHART 7.371  PCO2ART 45.4  PO2ART 62.0*  HCO3 25.6  O2SAT 91.2    Liver Function Tests: Recent Labs  Lab 11/22/20 0703 11/24/20 0831 11/26/20 0555  AST 38  --   --   ALT 25  --   --   ALKPHOS 135*  --   --   BILITOT 0.6  --   --   PROT 5.7*  --   --   ALBUMIN 2.1*  2.1* 2.0* 2.0*   No results for input(s): LIPASE, AMYLASE in the last 168 hours. No results for input(s): AMMONIA in the last 168 hours.  CBC: Recent Labs  Lab 11/22/20 0703 11/24/20 0831 11/25/20 0422  11/26/20 0555 11/27/20 0350  WBC 8.4 8.9 7.7 9.3  --   NEUTROABS 5.6  --   --   --   --   HGB 8.1* 7.5* 7.3* 6.1* 9.6*  HCT 26.0* 24.4* 23.4* 20.3* 29.9*  MCV 93.5 95.7 96.3 98.5  --   PLT 332 325 335 252  --     Cardiac Enzymes: No results for input(s): CKTOTAL, CKMB, CKMBINDEX, TROPONINI in the last 168 hours.  BNP (last 3 results) No results for input(s): BNP in the last 8760 hours.  ProBNP (last 3 results) No results for input(s): PROBNP in the last 8760 hours.  Radiological Exams: No results found.  Assessment/Plan Active Problems:   Acute on chronic respiratory failure with hypoxia (HCC)   Anoxic brain injury (HCC)   Acute renal failure superimposed on chronic kidney disease (HCC)   Pneumonia due to Pseudomonas (HCC)   1. Acute on chronic respiratory failure with hypoxia continue with pressure support currently on 12/5 continue pulmonary toilet supportive care 2. Anoxic brain injury no change 3. Acute renal failure on chronic kidney disease we will continue with supportive care 4. Pneumonia due to Pseudomonas has been treated we will follow along   I  have personally seen and evaluated the patient, evaluated laboratory and imaging results, formulated the assessment and plan and placed orders. The Patient requires high complexity decision making with multiple systems involvement.  Rounds were done with the Respiratory Therapy Director and Staff therapists and discussed with nursing staff also.  Allyne Gee, MD [facility] Pulmonary Critical Care Medicine Sleep Medicine

## 2020-11-29 DIAGNOSIS — N179 Acute kidney failure, unspecified: Secondary | ICD-10-CM | POA: Diagnosis not present

## 2020-11-29 DIAGNOSIS — J9621 Acute and chronic respiratory failure with hypoxia: Secondary | ICD-10-CM | POA: Diagnosis not present

## 2020-11-29 DIAGNOSIS — G931 Anoxic brain damage, not elsewhere classified: Secondary | ICD-10-CM | POA: Diagnosis not present

## 2020-11-29 DIAGNOSIS — J151 Pneumonia due to Pseudomonas: Secondary | ICD-10-CM | POA: Diagnosis not present

## 2020-11-29 LAB — CBC
HCT: 24.9 % — ABNORMAL LOW (ref 39.0–52.0)
Hemoglobin: 8.1 g/dL — ABNORMAL LOW (ref 13.0–17.0)
MCH: 31 pg (ref 26.0–34.0)
MCHC: 32.5 g/dL (ref 30.0–36.0)
MCV: 95.4 fL (ref 80.0–100.0)
Platelets: 314 10*3/uL (ref 150–400)
RBC: 2.61 MIL/uL — ABNORMAL LOW (ref 4.22–5.81)
RDW: 18.5 % — ABNORMAL HIGH (ref 11.5–15.5)
WBC: 9.7 10*3/uL (ref 4.0–10.5)
nRBC: 0 % (ref 0.0–0.2)

## 2020-11-29 LAB — RENAL FUNCTION PANEL
Albumin: 1.8 g/dL — ABNORMAL LOW (ref 3.5–5.0)
Anion gap: 9 (ref 5–15)
BUN: 44 mg/dL — ABNORMAL HIGH (ref 8–23)
CO2: 28 mmol/L (ref 22–32)
Calcium: 8.6 mg/dL — ABNORMAL LOW (ref 8.9–10.3)
Chloride: 95 mmol/L — ABNORMAL LOW (ref 98–111)
Creatinine, Ser: 4.57 mg/dL — ABNORMAL HIGH (ref 0.61–1.24)
GFR, Estimated: 13 mL/min — ABNORMAL LOW (ref >60)
Glucose, Bld: 100 mg/dL — ABNORMAL HIGH (ref 70–99)
Phosphorus: 4.2 mg/dL (ref 2.5–4.6)
Potassium: 3.3 mmol/L — ABNORMAL LOW (ref 3.5–5.1)
Sodium: 132 mmol/L — ABNORMAL LOW (ref 135–145)

## 2020-11-29 LAB — OCCULT BLOOD X 1 CARD TO LAB, STOOL: Fecal Occult Bld: POSITIVE — AB

## 2020-11-29 NOTE — Progress Notes (Signed)
Pulmonary Critical Care Medicine [facility] [place]   PULMONARY CRITICAL CARE SERVICE  PROGRESS NOTE     Joel Hebert  RDE:[id number]  DOB: Aug 30, 1955   DOA: 11/21/2020  Referring Physician: Carron Curie, MD  Joel Hebert is a 65 y.o. male seen for follow up of Acute on Chronic Respiratory Failure.  Patient is on pressure support mode has been on 12/5  Medications: Reviewed on Rounds  Physical Exam:  Vitals: Temperature is 97.6 pulse 80 respiratory rate is 14 blood pressure 155/62 saturations 98%  Ventilator Settings on pressure support 12/5 FiO2 28%  . General: Comfortable at this time . Eyes: Grossly normal lids, irises & conjunctiva . ENT: grossly tongue is normal . Neck: no obvious mass . Cardiovascular: S1 S2 normal no gallop . Respiratory: No rhonchi very coarse breath sounds . Abdomen: soft . Skin: no rash seen on limited exam . Musculoskeletal: not rigid . Psychiatric:unable to assess . Neurologic: no seizure no involuntary movements         Lab Data:   Basic Metabolic Panel: Recent Labs  Lab 11/24/20 0831 11/26/20 0555 11/29/20 0618  NA 135 137 132*  K 3.7 4.5 3.3*  CL 98 101 95*  CO2 26 26 28   GLUCOSE 130* 104* 100*  BUN 48* 49* 44*  CREATININE 4.45* 4.76* 4.57*  CALCIUM 9.1 8.7* 8.6*  PHOS 4.1 4.4 4.2    ABG: No results for input(s): PHART, PCO2ART, PO2ART, HCO3, O2SAT in the last 168 hours.  Liver Function Tests: Recent Labs  Lab 11/24/20 0831 11/26/20 0555 11/29/20 0618  ALBUMIN 2.0* 2.0* 1.8*   No results for input(s): LIPASE, AMYLASE in the last 168 hours. No results for input(s): AMMONIA in the last 168 hours.  CBC: Recent Labs  Lab 11/24/20 0831 11/25/20 0422 11/26/20 0555 11/27/20 0350 11/29/20 0814  WBC 8.9 7.7 9.3  --  9.7  HGB 7.5* 7.3* 6.1* 9.6* 8.1*  HCT 24.4* 23.4* 20.3* 29.9* 24.9*  MCV 95.7 96.3 98.5  --  95.4  PLT 325 335 252  --  314    Cardiac Enzymes: No results for  input(s): CKTOTAL, CKMB, CKMBINDEX, TROPONINI in the last 168 hours.  BNP (last 3 results) No results for input(s): BNP in the last 8760 hours.  ProBNP (last 3 results) No results for input(s): PROBNP in the last 8760 hours.  Radiological Exams: No results found.  Assessment/Plan Active Problems:   Acute on chronic respiratory failure with hypoxia (HCC)   Anoxic brain injury (HCC)   Acute renal failure superimposed on chronic kidney disease (HCC)   Pneumonia due to Pseudomonas (HCC)   1. Acute on chronic respiratory failure hypoxia plan is going to be to continue to wean on pressure support today's goal is 16 hours 2. Anoxic brain injury no change supportive care 3. Acute renal failure following up on labs 4. Pneumonia due to Pseudomonas treated   I have personally seen and evaluated the patient, evaluated laboratory and imaging results, formulated the assessment and plan and placed orders. The Patient requires high complexity decision making with multiple systems involvement.  Rounds were done with the Respiratory Therapy Director and Staff therapists and discussed with nursing staff also.  12/01/20, MD [facility] Pulmonary Critical Care Medicine Sleep Medicine

## 2020-11-29 NOTE — Progress Notes (Signed)
[facility]  ROUNDING NOTE   Subjective:  Patient to be due for hemodialysis treatment today. Still remains on the ventilator at the moment.  Objective:  Vital signs in last 24 hours:  Temperature 97.6 pulse 80 respirations 14 blood pressure 155/62  Physical Exam: General:  No acute distress  Head:  Normocephalic, atraumatic. Moist oral mucosal membranes  Eyes:  Anicteric  Neck:  tracheostomy in place  Lungs:   Clear to auscultation, normal effort  Heart:  S1S2 no rubs  Abdomen:   Soft, nontender, bowel sounds present  Extremities:  Left BKA.  Neurologic:  Awake, alert, following commands  Skin:  No lesions  Access:  Left IJ PermCath    Basic Metabolic Panel: Recent Labs  Lab 11/24/20 0831 11/26/20 0555  NA 135 137  K 3.7 4.5  CL 98 101  CO2 26 26  GLUCOSE 130* 104*  BUN 48* 49*  CREATININE 4.45* 4.76*  CALCIUM 9.1 8.7*  PHOS 4.1 4.4    Liver Function Tests: Recent Labs  Lab 11/24/20 0831 11/26/20 0555  ALBUMIN 2.0* 2.0*   No results for input(s): LIPASE, AMYLASE in the last 168 hours. No results for input(s): AMMONIA in the last 168 hours.  CBC: Recent Labs  Lab 11/24/20 0831 11/25/20 0422 11/26/20 0555 11/27/20 0350  WBC 8.9 7.7 9.3  --   HGB 7.5* 7.3* 6.1* 9.6*  HCT 24.4* 23.4* 20.3* 29.9*  MCV 95.7 96.3 98.5  --   PLT 325 335 252  --     Cardiac Enzymes: No results for input(s): CKTOTAL, CKMB, CKMBINDEX, TROPONINI in the last 168 hours.  BNP: Invalid input(s): POCBNP  CBG: No results for input(s): GLUCAP in the last 168 hours.  Microbiology: Results for orders placed or performed during the hospital encounter of 11/21/20  Culture, blood (routine x 2)     Status: None (Preliminary result)   Collection Time: 11/25/20  3:25 PM   Specimen: BLOOD RIGHT HAND  Result Value Ref Range Status   Specimen Description BLOOD RIGHT HAND  Final   Special Requests   Final    BOTTLES DRAWN AEROBIC ONLY Blood Culture results may not be  optimal due to an inadequate volume of blood received in culture bottles   Culture   Final    NO GROWTH 3 DAYS Performed at [facility], [address], [place], [place] [address]    Report Status PENDING  Incomplete  Culture, blood (routine x 2)     Status: None (Preliminary result)   Collection Time: 11/25/20  3:29 PM   Specimen: BLOOD LEFT HAND  Result Value Ref Range Status   Specimen Description BLOOD LEFT HAND  Final   Special Requests   Final    BOTTLES DRAWN AEROBIC AND ANAEROBIC Blood Culture adequate volume   Culture   Final    NO GROWTH 3 DAYS Performed at [facility], [address], [place], [place] [address]    Report Status PENDING  Incomplete    Coagulation Studies: No results for input(s): LABPROT, INR in the last 72 hours.  Urinalysis: No results for input(s): COLORURINE, LABSPEC, PHURINE, GLUCOSEU, HGBUR, BILIRUBINUR, KETONESUR, PROTEINUR, UROBILINOGEN, NITRITE, LEUKOCYTESUR in the last 72 hours.  Invalid input(s): APPERANCEUR    Imaging: No results found.   Medications:       Assessment/ Plan:  65 y.o. male with a PMHx of ESRD on HD TTS, anemia of chronic kidney disease, hypertension, acute respiratory failure status post tracheostomy placement,  seizure disorder, hyperlipidemia, diabetes mellitus type 2, history of hyperkalemia, history of coronary artery disease, peripheral vascular disease who was admitted to [facility] on 11/21/2020 for ongoing treatment of acute respiratory failure status post tracheostomy placement and ESRD.  1.  ESRD on HD.    We are planning for hemodialysis treatment today as per usual schedule on MWF.  2.  Acute respiratory failure.  Patient continues to require ventilatory support.  Weaning as per pulmonary/critical care.  3.  Anemia of chronic kidney disease.  Hemoglobin up to 9.6 posttransfusion.  4.  Secondary hyperparathyroidism.  Phosphorus this a.m. was 4.4 and within target range.   LOS:  0 Joel Hebert 5/23/20227:54 AM

## 2020-11-30 DIAGNOSIS — J9621 Acute and chronic respiratory failure with hypoxia: Secondary | ICD-10-CM | POA: Diagnosis not present

## 2020-11-30 DIAGNOSIS — G931 Anoxic brain damage, not elsewhere classified: Secondary | ICD-10-CM | POA: Diagnosis not present

## 2020-11-30 DIAGNOSIS — N179 Acute kidney failure, unspecified: Secondary | ICD-10-CM | POA: Diagnosis not present

## 2020-11-30 DIAGNOSIS — J151 Pneumonia due to Pseudomonas: Secondary | ICD-10-CM | POA: Diagnosis not present

## 2020-11-30 LAB — CULTURE, BLOOD (ROUTINE X 2)
Culture: NO GROWTH
Culture: NO GROWTH
Special Requests: ADEQUATE

## 2020-11-30 NOTE — Progress Notes (Signed)
Pulmonary Critical Care Medicine [facility] [place]   PULMONARY CRITICAL CARE SERVICE  PROGRESS NOTE     Raylon Lamson  WGN:[id number]  DOB: August 27, 1955   DOA: 11/21/2020  Referring Physician: Carron Curie, MD  HPI: Ziad Maye is a 65 y.o. male seen for follow up of Acute on Chronic Respiratory Failure.  Patient is on pressure support mode had done 2 hours of T collar  Medications: Reviewed on Rounds  Physical Exam:  Vitals: Temperature is 99.4 pulse 87 respiratory 16 blood pressure is 166/78  Ventilator Settings on pressure support FiO2 is 28% pressure 12/5  . General: Comfortable at this time . Eyes: Grossly normal lids, irises & conjunctiva . ENT: grossly tongue is normal . Neck: no obvious mass . Cardiovascular: S1 S2 normal no gallop . Respiratory: No rhonchi very coarse breath sounds . Abdomen: soft . Skin: no rash seen on limited exam . Musculoskeletal: not rigid . Psychiatric:unable to assess . Neurologic: no seizure no involuntary movements         Lab Data:   Basic Metabolic Panel: Recent Labs  Lab 11/24/20 0831 11/26/20 0555 11/29/20 0618  NA 135 137 132*  K 3.7 4.5 3.3*  CL 98 101 95*  CO2 26 26 28   GLUCOSE 130* 104* 100*  BUN 48* 49* 44*  CREATININE 4.45* 4.76* 4.57*  CALCIUM 9.1 8.7* 8.6*  PHOS 4.1 4.4 4.2    ABG: No results for input(s): PHART, PCO2ART, PO2ART, HCO3, O2SAT in the last 168 hours.  Liver Function Tests: Recent Labs  Lab 11/24/20 0831 11/26/20 0555 11/29/20 0618  ALBUMIN 2.0* 2.0* 1.8*   No results for input(s): LIPASE, AMYLASE in the last 168 hours. No results for input(s): AMMONIA in the last 168 hours.  CBC: Recent Labs  Lab 11/24/20 0831 11/25/20 0422 11/26/20 0555 11/27/20 0350 11/29/20 0814  WBC 8.9 7.7 9.3  --  9.7  HGB 7.5* 7.3* 6.1* 9.6* 8.1*  HCT 24.4* 23.4* 20.3* 29.9* 24.9*  MCV 95.7 96.3 98.5  --  95.4  PLT 325 335 252  --  314    Cardiac Enzymes: No results for  input(s): CKTOTAL, CKMB, CKMBINDEX, TROPONINI in the last 168 hours.  BNP (last 3 results) No results for input(s): BNP in the last 8760 hours.  ProBNP (last 3 results) No results for input(s): PROBNP in the last 8760 hours.  Radiological Exams: No results found.  Assessment/Plan Active Problems:   Acute on chronic respiratory failure with hypoxia (HCC)   Anoxic brain injury (HCC)   Acute renal failure superimposed on chronic kidney disease (HCC)   Pneumonia due to Pseudomonas (HCC)   1. Acute on chronic respiratory failure hypoxia we will continue with full support on the pressure support right now he completed 2 hours of T collar tomorrow we will reassess and advance 2. Anoxic brain injury absolutely no change noted. 3. Acute renal failure supportive care 4. Pneumonia due to Pseudomonas has been treated with antibiotic   I have personally seen and evaluated the patient, evaluated laboratory and imaging results, formulated the assessment and plan and placed orders. The Patient requires high complexity decision making with multiple systems involvement.  Rounds were done with the Respiratory Therapy Director and Staff therapists and discussed with nursing staff also.  12/01/20, MD [facility] Pulmonary Critical Care Medicine Sleep Medicine

## 2020-12-01 DIAGNOSIS — G931 Anoxic brain damage, not elsewhere classified: Secondary | ICD-10-CM | POA: Diagnosis not present

## 2020-12-01 DIAGNOSIS — J9621 Acute and chronic respiratory failure with hypoxia: Secondary | ICD-10-CM | POA: Diagnosis not present

## 2020-12-01 DIAGNOSIS — N179 Acute kidney failure, unspecified: Secondary | ICD-10-CM | POA: Diagnosis not present

## 2020-12-01 DIAGNOSIS — J151 Pneumonia due to Pseudomonas: Secondary | ICD-10-CM | POA: Diagnosis not present

## 2020-12-01 LAB — CBC
HCT: 21.7 % — ABNORMAL LOW (ref 39.0–52.0)
Hemoglobin: 7 g/dL — ABNORMAL LOW (ref 13.0–17.0)
MCH: 30.8 pg (ref 26.0–34.0)
MCHC: 32.3 g/dL (ref 30.0–36.0)
MCV: 95.6 fL (ref 80.0–100.0)
Platelets: 337 10*3/uL (ref 150–400)
RBC: 2.27 MIL/uL — ABNORMAL LOW (ref 4.22–5.81)
RDW: 18.2 % — ABNORMAL HIGH (ref 11.5–15.5)
WBC: 10.6 10*3/uL — ABNORMAL HIGH (ref 4.0–10.5)
nRBC: 0 % (ref 0.0–0.2)

## 2020-12-01 LAB — RENAL FUNCTION PANEL
Albumin: 1.7 g/dL — ABNORMAL LOW (ref 3.5–5.0)
Anion gap: 12 (ref 5–15)
BUN: 58 mg/dL — ABNORMAL HIGH (ref 8–23)
CO2: 26 mmol/L (ref 22–32)
Calcium: 8.3 mg/dL — ABNORMAL LOW (ref 8.9–10.3)
Chloride: 89 mmol/L — ABNORMAL LOW (ref 98–111)
Creatinine, Ser: 4.42 mg/dL — ABNORMAL HIGH (ref 0.61–1.24)
GFR, Estimated: 14 mL/min — ABNORMAL LOW (ref >60)
Glucose, Bld: 125 mg/dL — ABNORMAL HIGH (ref 70–99)
Phosphorus: 3.8 mg/dL (ref 2.5–4.6)
Potassium: 3.8 mmol/L (ref 3.5–5.1)
Sodium: 127 mmol/L — ABNORMAL LOW (ref 135–145)

## 2020-12-01 NOTE — Progress Notes (Signed)
Pulmonary Critical Care Medicine [facility] [place]   PULMONARY CRITICAL CARE SERVICE  PROGRESS NOTE     Bulmaro Feagans  EXH:[id number]  DOB: [birth year]   DOA: 11/21/2020  Referring Physician: Carron Curie, MD  HPI: Joel Hebert is a 65 y.o. male seen for follow up of Acute on Chronic Respiratory Failure.  This morning patient was on T collar was tolerating it fairly well with a goal of 4 hours today.  Medications: Reviewed on Rounds  Physical Exam:  Vitals: Temperature 98.7 pulse 76 respiratory rate 20 blood pressure 144/62 saturations 100%  Ventilator Settings on T collar  . General: Comfortable at this time . Eyes: Grossly normal lids, irises & conjunctiva . ENT: grossly tongue is normal . Neck: no obvious mass . Cardiovascular: S1 S2 normal no gallop . Respiratory: Scattered rhonchi expansion equal . Abdomen: soft . Skin: no rash seen on limited exam . Musculoskeletal: not rigid . Psychiatric:unable to assess . Neurologic: no seizure no involuntary movements         Lab Data:   Basic Metabolic Panel: Recent Labs  Lab 11/26/20 0555 11/29/20 0618 12/01/20 0630  NA 137 132* 127*  K 4.5 3.3* 3.8  CL 101 95* 89*  CO2 26 28 26   GLUCOSE 104* 100* 125*  BUN 49* 44* 58*  CREATININE 4.76* 4.57* 4.42*  CALCIUM 8.7* 8.6* 8.3*  PHOS 4.4 4.2 3.8    ABG: No results for input(s): PHART, PCO2ART, PO2ART, HCO3, O2SAT in the last 168 hours.  Liver Function Tests: Recent Labs  Lab 11/26/20 0555 11/29/20 0618 12/01/20 0630  ALBUMIN 2.0* 1.8* 1.7*   No results for input(s): LIPASE, AMYLASE in the last 168 hours. No results for input(s): AMMONIA in the last 168 hours.  CBC: Recent Labs  Lab 11/25/20 0422 11/26/20 0555 11/27/20 0350 11/29/20 0814 12/01/20 0630  WBC 7.7 9.3  --  9.7 10.6*  HGB 7.3* 6.1* 9.6* 8.1* 7.0*  HCT 23.4* 20.3* 29.9* 24.9* 21.7*  MCV 96.3 98.5  --  95.4 95.6  PLT 335 252  --  314 337    Cardiac  Enzymes: No results for input(s): CKTOTAL, CKMB, CKMBINDEX, TROPONINI in the last 168 hours.  BNP (last 3 results) No results for input(s): BNP in the last 8760 hours.  ProBNP (last 3 results) No results for input(s): PROBNP in the last 8760 hours.  Radiological Exams: No results found.  Assessment/Plan Active Problems:   Acute on chronic respiratory failure with hypoxia (HCC)   Anoxic brain injury (HCC)   Acute renal failure superimposed on chronic kidney disease (HCC)   Pneumonia due to Pseudomonas (HCC)   1. Acute on chronic respiratory failure with hypoxia I had a chance to talk to the patient's daughter at the bedside explained to her that his neurological status is unlikely to show any improvement.  In addition while we are working towards weaning him off the ventilator if he is not able to be decannulated then because of dialysis it may be difficult to place him in the state of 12/03/20.  We will work towards decannulation if it is feasible 2. Anoxic brain injury there is been no neurological improvement 3. Acute renal failure we will continue with supportive care 4. Pseudomonas pneumonia treated continue to monitor closely   I have personally seen and evaluated the patient, evaluated laboratory and imaging results, formulated the assessment and plan and placed orders. The Patient requires high complexity decision making with multiple systems involvement.  Rounds were done with the Respiratory Therapy Director and Staff therapists and discussed with nursing staff also.  Allyne Gee, MD [facility] Pulmonary Critical Care Medicine Sleep Medicine

## 2020-12-01 NOTE — Progress Notes (Signed)
[facility]  ROUNDING NOTE   Subjective:  Patient seen and evaluated during hemodialysis treatment.  Tolerating well. Ultrafiltration target 1.5 kg. Overall remains critically ill.  Objective:  Vital signs in last 24 hours:  Temperature 98.8 pulse 88 respirations 20 blood pressure 143/62  Physical Exam: General:  Critically ill-appearing  Head:  Normocephalic, atraumatic. Moist oral mucosal membranes  Eyes:  Anicteric  Neck:  tracheostomy in place  Lungs:   Scattered rhonchi, vent assisted  Heart:  S1S2 no rubs  Abdomen:   Soft, nontender, bowel sounds present  Extremities:  Left BKA.  Neurologic:  Lethargic but arousable  Skin:  No lesions  Access:  Left IJ PermCath    Basic Metabolic Panel: Recent Labs  Lab 11/24/20 0831 11/26/20 0555 11/29/20 0618 12/01/20 0630  NA 135 137 132* 127*  K 3.7 4.5 3.3* 3.8  CL 98 101 95* 89*  CO2 26 26 28 26   GLUCOSE 130* 104* 100* 125*  BUN 48* 49* 44* 58*  CREATININE 4.45* 4.76* 4.57* 4.42*  CALCIUM 9.1 8.7* 8.6* 8.3*  PHOS 4.1 4.4 4.2 3.8    Liver Function Tests: Recent Labs  Lab 11/24/20 0831 11/26/20 0555 11/29/20 0618 12/01/20 0630  ALBUMIN 2.0* 2.0* 1.8* 1.7*   No results for input(s): LIPASE, AMYLASE in the last 168 hours. No results for input(s): AMMONIA in the last 168 hours.  CBC: Recent Labs  Lab 11/24/20 0831 11/25/20 0422 11/26/20 0555 11/27/20 0350 11/29/20 0814 12/01/20 0630  WBC 8.9 7.7 9.3  --  9.7 10.6*  HGB 7.5* 7.3* 6.1* 9.6* 8.1* 7.0*  HCT 24.4* 23.4* 20.3* 29.9* 24.9* 21.7*  MCV 95.7 96.3 98.5  --  95.4 95.6  PLT 325 335 252  --  314 337    Cardiac Enzymes: No results for input(s): CKTOTAL, CKMB, CKMBINDEX, TROPONINI in the last 168 hours.  BNP: Invalid input(s): POCBNP  CBG: No results for input(s): GLUCAP in the last 168 hours.  Microbiology: Results for orders placed or performed during the hospital encounter of 11/21/20  Culture, blood (routine x 2)     Status:  None   Collection Time: 11/25/20  3:25 PM   Specimen: BLOOD RIGHT HAND  Result Value Ref Range Status   Specimen Description BLOOD RIGHT HAND  Final   Special Requests   Final    BOTTLES DRAWN AEROBIC ONLY Blood Culture results may not be optimal due to an inadequate volume of blood received in culture bottles   Culture   Final    NO GROWTH 5 DAYS Performed at [facility], [address] [place], [place]    Report Status 11/30/2020 FINAL  Final  Culture, blood (routine x 2)     Status: None   Collection Time: 11/25/20  3:29 PM   Specimen: BLOOD LEFT HAND  Result Value Ref Range Status   Specimen Description BLOOD LEFT HAND  Final   Special Requests   Final    BOTTLES DRAWN AEROBIC AND ANAEROBIC Blood Culture adequate volume   Culture   Final    NO GROWTH 5 DAYS Performed at [facility], [address], [place], [place]    Report Status 11/30/2020 FINAL  Final    Coagulation Studies: No results for input(s): LABPROT, INR in the last 72 hours.  Urinalysis: No results for input(s): COLORURINE, LABSPEC, PHURINE, GLUCOSEU, HGBUR, BILIRUBINUR, KETONESUR, PROTEINUR, UROBILINOGEN, NITRITE, LEUKOCYTESUR in the last 72 hours.  Invalid input(s): APPERANCEUR    Imaging:  No results found.   Medications:       Assessment/ Plan:  65 y.o. male with a PMHx of ESRD on HD TTS, anemia of chronic kidney disease, hypertension, acute respiratory failure status post tracheostomy placement, seizure disorder, hyperlipidemia, diabetes mellitus type 2, history of hyperkalemia, history of coronary artery disease, peripheral vascular disease who was admitted to [facility] on 11/21/2020 for ongoing treatment of acute respiratory failure status post tracheostomy placement and ESRD.  1.  ESRD on HD.    Patient seen and evaluated during hemodialysis treatment today.  Tolerating well thus far.  Ultrafiltration target 1.5 kg.  2.  Acute respiratory failure.   Continue vent support at this time.  3.  Anemia of chronic kidney disease.  Hemoglobin is dropped to 7.0.  Consider blood transfusion but defer to primary team.  4.  Secondary hyperparathyroidism.  Phosphorus acceptable at 3.8.   LOS: 0 Bing Duffey 5/25/20227:58 AM

## 2020-12-02 DIAGNOSIS — G931 Anoxic brain damage, not elsewhere classified: Secondary | ICD-10-CM | POA: Diagnosis not present

## 2020-12-02 DIAGNOSIS — J151 Pneumonia due to Pseudomonas: Secondary | ICD-10-CM | POA: Diagnosis not present

## 2020-12-02 DIAGNOSIS — N179 Acute kidney failure, unspecified: Secondary | ICD-10-CM | POA: Diagnosis not present

## 2020-12-02 DIAGNOSIS — J9621 Acute and chronic respiratory failure with hypoxia: Secondary | ICD-10-CM | POA: Diagnosis not present

## 2020-12-02 LAB — HEMOGLOBIN AND HEMATOCRIT, BLOOD
HCT: 27.2 % — ABNORMAL LOW (ref 39.0–52.0)
Hemoglobin: 8.7 g/dL — ABNORMAL LOW (ref 13.0–17.0)

## 2020-12-02 NOTE — Progress Notes (Signed)
Pulmonary Critical Care Medicine [facility] [place]   PULMONARY CRITICAL CARE SERVICE  PROGRESS NOTE     Joel Hebert  EUM:[id number]  DOB: [birth year]   DOA: 11/21/2020  Referring Physician: Carron Curie, MD  HPI: Joel Hebert is a 65 y.o. male seen for follow up of Acute on Chronic Respiratory Failure.  Patient is resting comfortably right now without any distress remains on wean as supposed to be doing 2 hours of T collar  Medications: Reviewed on Rounds  Physical Exam:  Vitals: Temperature is 99.4 pulse 90 respiratory rate is 32 blood pressure is 159/71 saturations 98%  Ventilator Settings T collar  . General: Comfortable at this time . Eyes: Grossly normal lids, irises & conjunctiva . ENT: grossly tongue is normal . Neck: no obvious mass . Cardiovascular: S1 S2 normal no gallop . Respiratory: Scattered rhonchi expansion equal . Abdomen: soft . Skin: no rash seen on limited exam . Musculoskeletal: not rigid . Psychiatric:unable to assess . Neurologic: no seizure no involuntary movements         Lab Data:   Basic Metabolic Panel: Recent Labs  Lab 11/26/20 0555 11/29/20 0618 12/01/20 0630  NA 137 132* 127*  K 4.5 3.3* 3.8  CL 101 95* 89*  CO2 26 28 26   GLUCOSE 104* 100* 125*  BUN 49* 44* 58*  CREATININE 4.76* 4.57* 4.42*  CALCIUM 8.7* 8.6* 8.3*  PHOS 4.4 4.2 3.8    ABG: No results for input(s): PHART, PCO2ART, PO2ART, HCO3, O2SAT in the last 168 hours.  Liver Function Tests: Recent Labs  Lab 11/26/20 0555 11/29/20 0618 12/01/20 0630  ALBUMIN 2.0* 1.8* 1.7*   No results for input(s): LIPASE, AMYLASE in the last 168 hours. No results for input(s): AMMONIA in the last 168 hours.  CBC: Recent Labs  Lab 11/26/20 0555 11/27/20 0350 11/29/20 0814 12/01/20 0630 12/02/20 0405  WBC 9.3  --  9.7 10.6*  --   HGB 6.1* 9.6* 8.1* 7.0* 8.7*  HCT 20.3* 29.9* 24.9* 21.7* 27.2*  MCV 98.5  --  95.4 95.6  --   PLT 252  --  314  337  --     Cardiac Enzymes: No results for input(s): CKTOTAL, CKMB, CKMBINDEX, TROPONINI in the last 168 hours.  BNP (last 3 results) No results for input(s): BNP in the last 8760 hours.  ProBNP (last 3 results) No results for input(s): PROBNP in the last 8760 hours.  Radiological Exams: No results found.  Assessment/Plan Active Problems:   Acute on chronic respiratory failure with hypoxia (HCC)   Anoxic brain injury (HCC)   Acute renal failure superimposed on chronic kidney disease (HCC)   Pneumonia due to Pseudomonas (HCC)   1. Acute on chronic respiratory failure with hypoxia plan is going to be to wean on the T collar as tolerated patient's goal is 8 hours 2. Anoxic brain injury no change we will continue with supportive care 3. Pneumonia due to Pseudomonas we will continue to follow along 4. Acute on chronic renal failure following patient's labs and hydration status carefully nephrology also following   I have personally seen and evaluated the patient, evaluated laboratory and imaging results, formulated the assessment and plan and placed orders. The Patient requires high complexity decision making with multiple systems involvement.  Rounds were done with the Respiratory Therapy Director and Staff therapists and discussed with nursing staff also.  12/04/20, MD [facility] Pulmonary Critical Care Medicine Sleep Medicine

## 2020-12-03 LAB — RENAL FUNCTION PANEL
Albumin: 2.1 g/dL — ABNORMAL LOW (ref 3.5–5.0)
Anion gap: 11 (ref 5–15)
BUN: 71 mg/dL — ABNORMAL HIGH (ref 8–23)
CO2: 26 mmol/L (ref 22–32)
Calcium: 9.3 mg/dL (ref 8.9–10.3)
Chloride: 96 mmol/L — ABNORMAL LOW (ref 98–111)
Creatinine, Ser: 4.23 mg/dL — ABNORMAL HIGH (ref 0.61–1.24)
GFR, Estimated: 15 mL/min — ABNORMAL LOW (ref >60)
Glucose, Bld: 167 mg/dL — ABNORMAL HIGH (ref 70–99)
Phosphorus: 3.7 mg/dL (ref 2.5–4.6)
Potassium: 4 mmol/L (ref 3.5–5.1)
Sodium: 133 mmol/L — ABNORMAL LOW (ref 135–145)

## 2020-12-03 LAB — CBC
HCT: 26.6 % — ABNORMAL LOW (ref 39.0–52.0)
Hemoglobin: 8.6 g/dL — ABNORMAL LOW (ref 13.0–17.0)
MCH: 31.3 pg (ref 26.0–34.0)
MCHC: 32.3 g/dL (ref 30.0–36.0)
MCV: 96.7 fL (ref 80.0–100.0)
Platelets: 431 10*3/uL — ABNORMAL HIGH (ref 150–400)
RBC: 2.75 MIL/uL — ABNORMAL LOW (ref 4.22–5.81)
RDW: 18.4 % — ABNORMAL HIGH (ref 11.5–15.5)
WBC: 16.9 10*3/uL — ABNORMAL HIGH (ref 4.0–10.5)
nRBC: 0 % (ref 0.0–0.2)

## 2020-12-03 NOTE — Progress Notes (Signed)
[facility]  ROUNDING NOTE   Subjective:  Patient continues to be on the ventilator. Currently sitting up in chair but not following commands. Due for hemodialysis treatment today.  Objective:  Vital signs in last 24 hours:  Temperature 98.7 pulse 83 respirations 29 blood pressure 149/70  Physical Exam: General:  No acute distress  Head:  Normocephalic, atraumatic. Moist oral mucosal membranes  Eyes:  Anicteric  Neck:  tracheostomy in place  Lungs:   Scattered rhonchi, vent assisted  Heart:  S1S2 no rubs  Abdomen:   Soft, nontender, bowel sounds present  Extremities:  Left BKA.  Trace right lower extremity edema  Neurologic:  Not following commands  Skin:  No lesions  Access:  Left IJ PermCath    Basic Metabolic Panel: Recent Labs  Lab 11/29/20 0618 12/01/20 0630  NA 132* 127*  K 3.3* 3.8  CL 95* 89*  CO2 28 26  GLUCOSE 100* 125*  BUN 44* 58*  CREATININE 4.57* 4.42*  CALCIUM 8.6* 8.3*  PHOS 4.2 3.8    Liver Function Tests: Recent Labs  Lab 11/29/20 0618 12/01/20 0630  ALBUMIN 1.8* 1.7*   No results for input(s): LIPASE, AMYLASE in the last 168 hours. No results for input(s): AMMONIA in the last 168 hours.  CBC: Recent Labs  Lab 11/27/20 0350 11/29/20 0814 12/01/20 0630 12/02/20 0405  WBC  --  9.7 10.6*  --   HGB 9.6* 8.1* 7.0* 8.7*  HCT 29.9* 24.9* 21.7* 27.2*  MCV  --  95.4 95.6  --   PLT  --  314 337  --     Cardiac Enzymes: No results for input(s): CKTOTAL, CKMB, CKMBINDEX, TROPONINI in the last 168 hours.  BNP: Invalid input(s): POCBNP  CBG: No results for input(s): GLUCAP in the last 168 hours.  Microbiology: Results for orders placed or performed during the hospital encounter of 11/21/20  Culture, blood (routine x 2)     Status: None   Collection Time: 11/25/20  3:25 PM   Specimen: BLOOD RIGHT HAND  Result Value Ref Range Status   Specimen Description BLOOD RIGHT HAND  Final   Special Requests   Final    BOTTLES  DRAWN AEROBIC ONLY Blood Culture results may not be optimal due to an inadequate volume of blood received in culture bottles   Culture   Final    NO GROWTH 5 DAYS Performed at [facility], [address], [place], [place] [address]    Report Status 11/30/2020 FINAL  Final  Culture, blood (routine x 2)     Status: None   Collection Time: 11/25/20  3:29 PM   Specimen: BLOOD LEFT HAND  Result Value Ref Range Status   Specimen Description BLOOD LEFT HAND  Final   Special Requests   Final    BOTTLES DRAWN AEROBIC AND ANAEROBIC Blood Culture adequate volume   Culture   Final    NO GROWTH 5 DAYS Performed at [facility], [address], [place], [place] [address]    Report Status 11/30/2020 FINAL  Final    Coagulation Studies: No results for input(s): LABPROT, INR in the last 72 hours.  Urinalysis: No results for input(s): COLORURINE, LABSPEC, PHURINE, GLUCOSEU, HGBUR, BILIRUBINUR, KETONESUR, PROTEINUR, UROBILINOGEN, NITRITE, LEUKOCYTESUR in the last 72 hours.  Invalid input(s): APPERANCEUR    Imaging: No results found.   Medications:       Assessment/ Plan:  65 y.o. male with a PMHx of ESRD on HD TTS,  anemia of chronic kidney disease, hypertension, acute respiratory failure status post tracheostomy placement, seizure disorder, hyperlipidemia, diabetes mellitus type 2, history of hyperkalemia, history of coronary artery disease, peripheral vascular disease who was admitted to [facility] on 11/21/2020 for ongoing treatment of acute respiratory failure status post tracheostomy placement and ESRD.  1.  ESRD on HD.    We will maintain the patient on MWF dialysis schedule.  Therefore due for dialysis today.  2.  Acute respiratory failure.  Maintain the patient on vent support.  3.  Anemia of chronic kidney disease.  Hemoglobin up to 8.7 posttransfusion.  Maintain the patient on Retacrit 4000 units IV with dialysis.  4.  Secondary hyperparathyroidism.  Repeat  serum phosphorus today.   LOS: 0 Fawzi Melman 5/27/20228:08 AM

## 2020-12-05 DIAGNOSIS — N179 Acute kidney failure, unspecified: Secondary | ICD-10-CM | POA: Diagnosis not present

## 2020-12-05 DIAGNOSIS — G931 Anoxic brain damage, not elsewhere classified: Secondary | ICD-10-CM | POA: Diagnosis not present

## 2020-12-05 DIAGNOSIS — J151 Pneumonia due to Pseudomonas: Secondary | ICD-10-CM | POA: Diagnosis not present

## 2020-12-05 DIAGNOSIS — J9621 Acute and chronic respiratory failure with hypoxia: Secondary | ICD-10-CM | POA: Diagnosis not present

## 2020-12-05 NOTE — Progress Notes (Signed)
Pulmonary Critical Care Medicine [facility] [place]   PULMONARY CRITICAL CARE SERVICE  PROGRESS NOTE     Rhyse Loux  WER:[id number]  DOB: [birth year]   DOA: 11/21/2020  Referring Physician: Carron Curie, MD  HPI: English Craighead is a 65 y.o. male seen for follow up of Acute on Chronic Respiratory Failure.  Patient currently is on T collar the goal today is for 24 hours  Medications: Reviewed on Rounds  Physical Exam:  Vitals: Temperature is 98.4 pulse 90 respiratory 27 blood pressure is 158/69 saturations 100%  Ventilator Settings on T collar with an FiO2 28%  . General: Comfortable at this time . Eyes: Grossly normal lids, irises & conjunctiva . ENT: grossly tongue is normal . Neck: no obvious mass . Cardiovascular: S1 S2 normal no gallop . Respiratory: Scattered rhonchi expansion is equal . Abdomen: soft . Skin: no rash seen on limited exam . Musculoskeletal: not rigid . Psychiatric:unable to assess . Neurologic: no seizure no involuntary movements         Lab Data:   Basic Metabolic Panel: Recent Labs  Lab 11/29/20 0618 12/01/20 0630 12/03/20 0917  NA 132* 127* 133*  K 3.3* 3.8 4.0  CL 95* 89* 96*  CO2 28 26 26   GLUCOSE 100* 125* 167*  BUN 44* 58* 71*  CREATININE 4.57* 4.42* 4.23*  CALCIUM 8.6* 8.3* 9.3  PHOS 4.2 3.8 3.7    ABG: No results for input(s): PHART, PCO2ART, PO2ART, HCO3, O2SAT in the last 168 hours.  Liver Function Tests: Recent Labs  Lab 11/29/20 0618 12/01/20 0630 12/03/20 0917  ALBUMIN 1.8* 1.7* 2.1*   No results for input(s): LIPASE, AMYLASE in the last 168 hours. No results for input(s): AMMONIA in the last 168 hours.  CBC: Recent Labs  Lab 11/29/20 0814 12/01/20 0630 12/02/20 0405 12/03/20 0917  WBC 9.7 10.6*  --  16.9*  HGB 8.1* 7.0* 8.7* 8.6*  HCT 24.9* 21.7* 27.2* 26.6*  MCV 95.4 95.6  --  96.7  PLT 314 337  --  431*    Cardiac Enzymes: No results for input(s): CKTOTAL, CKMB,  CKMBINDEX, TROPONINI in the last 168 hours.  BNP (last 3 results) No results for input(s): BNP in the last 8760 hours.  ProBNP (last 3 results) No results for input(s): PROBNP in the last 8760 hours.  Radiological Exams: No results found.  Assessment/Plan Active Problems:   Acute on chronic respiratory failure with hypoxia (HCC)   Anoxic brain injury (HCC)   Acute renal failure superimposed on chronic kidney disease (HCC)   Pneumonia due to Pseudomonas (HCC)   1. Acute on chronic respiratory failure hypoxia plan is going to be to continue with T collar with goal of 24 hours today 2. Anoxic brain injury no change we will continue to monitor 3. Acute renal failure continue with supportive care 4. Pseudomonal pneumonia treated we will continue to follow   I have personally seen and evaluated the patient, evaluated laboratory and imaging results, formulated the assessment and plan and placed orders. The Patient requires high complexity decision making with multiple systems involvement.  Rounds were done with the Respiratory Therapy Director and Staff therapists and discussed with nursing staff also.  12/05/20, MD [facility] Pulmonary Critical Care Medicine Sleep Medicine

## 2020-12-06 ENCOUNTER — Other Ambulatory Visit (HOSPITAL_COMMUNITY): Payer: Medicare Other

## 2020-12-06 DIAGNOSIS — J151 Pneumonia due to Pseudomonas: Secondary | ICD-10-CM | POA: Diagnosis not present

## 2020-12-06 DIAGNOSIS — N179 Acute kidney failure, unspecified: Secondary | ICD-10-CM | POA: Diagnosis not present

## 2020-12-06 DIAGNOSIS — G931 Anoxic brain damage, not elsewhere classified: Secondary | ICD-10-CM | POA: Diagnosis not present

## 2020-12-06 DIAGNOSIS — J9621 Acute and chronic respiratory failure with hypoxia: Secondary | ICD-10-CM | POA: Diagnosis not present

## 2020-12-06 LAB — RENAL FUNCTION PANEL
Albumin: 2 g/dL — ABNORMAL LOW (ref 3.5–5.0)
Anion gap: 11 (ref 5–15)
BUN: 85 mg/dL — ABNORMAL HIGH (ref 8–23)
CO2: 27 mmol/L (ref 22–32)
Calcium: 9.4 mg/dL (ref 8.9–10.3)
Chloride: 94 mmol/L — ABNORMAL LOW (ref 98–111)
Creatinine, Ser: 3.99 mg/dL — ABNORMAL HIGH (ref 0.61–1.24)
GFR, Estimated: 16 mL/min — ABNORMAL LOW (ref >60)
Glucose, Bld: 195 mg/dL — ABNORMAL HIGH (ref 70–99)
Phosphorus: 3.4 mg/dL (ref 2.5–4.6)
Potassium: 3.8 mmol/L (ref 3.5–5.1)
Sodium: 132 mmol/L — ABNORMAL LOW (ref 135–145)

## 2020-12-06 LAB — BLOOD GAS, ARTERIAL
Acid-Base Excess: 5.2 mmol/L — ABNORMAL HIGH (ref 0.0–2.0)
Bicarbonate: 29.5 mmol/L — ABNORMAL HIGH (ref 20.0–28.0)
FIO2: 28
O2 Saturation: 78.3 %
Patient temperature: 37
pCO2 arterial: 46 mmHg (ref 32.0–48.0)
pH, Arterial: 7.423 (ref 7.350–7.450)
pO2, Arterial: 42.6 mmHg — ABNORMAL LOW (ref 83.0–108.0)

## 2020-12-06 NOTE — Progress Notes (Signed)
Pulmonary Critical Care Medicine [facility] [place]   PULMONARY CRITICAL CARE SERVICE  PROGRESS NOTE     Joel Hebert  ZHG:[id number]  DOB: 1955/07/14   DOA: 11/21/2020  Referring Physician: Carron Curie, MD  HPI: Joel Hebert is a 65 y.o. male seen for follow up of Acute on Chronic Respiratory Failure.  Patient is on T collar right now seems to be comfortable without any distress.  Medications: Reviewed on Rounds  Physical Exam:  Vitals: Temperature 99.8 pulse 91 respiratory 20 blood pressure is 169/73 saturations 100%  Ventilator Settings on T collar FiO2 28%  . General: Comfortable at this time . Eyes: Grossly normal lids, irises & conjunctiva . ENT: grossly tongue is normal . Neck: no obvious mass . Cardiovascular: S1 S2 normal no gallop . Respiratory: Scattered rhonchi expansion is equal . Abdomen: soft . Skin: no rash seen on limited exam . Musculoskeletal: not rigid . Psychiatric:unable to assess . Neurologic: no seizure no involuntary movements         Lab Data:   Basic Metabolic Panel: Recent Labs  Lab 12/01/20 0630 12/03/20 0917 12/06/20 0839  NA 127* 133* 132*  K 3.8 4.0 3.8  CL 89* 96* 94*  CO2 26 26 27   GLUCOSE 125* 167* 195*  BUN 58* 71* 85*  CREATININE 4.42* 4.23* 3.99*  CALCIUM 8.3* 9.3 9.4  PHOS 3.8 3.7 3.4    ABG: Recent Labs  Lab 12/06/20 0820  PHART 7.423  PCO2ART 46.0  PO2ART 42.6*  HCO3 29.5*  O2SAT 78.3    Liver Function Tests: Recent Labs  Lab 12/01/20 0630 12/03/20 0917 12/06/20 0839  ALBUMIN 1.7* 2.1* 2.0*   No results for input(s): LIPASE, AMYLASE in the last 168 hours. No results for input(s): AMMONIA in the last 168 hours.  CBC: Recent Labs  Lab 12/01/20 0630 12/02/20 0405 12/03/20 0917  WBC 10.6*  --  16.9*  HGB 7.0* 8.7* 8.6*  HCT 21.7* 27.2* 26.6*  MCV 95.6  --  96.7  PLT 337  --  431*    Cardiac Enzymes: No results for input(s): CKTOTAL, CKMB, CKMBINDEX, TROPONINI  in the last 168 hours.  BNP (last 3 results) No results for input(s): BNP in the last 8760 hours.  ProBNP (last 3 results) No results for input(s): PROBNP in the last 8760 hours.  Radiological Exams: No results found.  Assessment/Plan Active Problems:   Acute on chronic respiratory failure with hypoxia (HCC)   Anoxic brain injury (HCC)   Acute renal failure superimposed on chronic kidney disease (HCC)   Pneumonia due to Pseudomonas (HCC)   1. Acute on chronic respiratory failure with hypoxia plan is going to be to continue with T collar trials titrate oxygen as tolerated continue pulmonary toilet 2. Anoxic brain injury supportive care 3. Acute renal failure we will continue to follow along 4. Pneumonia due to Pseudomonas treated we will continue with supportive care   I have personally seen and evaluated the patient, evaluated laboratory and imaging results, formulated the assessment and plan and placed orders. The Patient requires high complexity decision making with multiple systems involvement.  Rounds were done with the Respiratory Therapy Director and Staff therapists and discussed with nursing staff also.  12/05/20, MD [facility] Pulmonary Critical Care Medicine Sleep Medicine

## 2020-12-06 NOTE — Progress Notes (Signed)
[facility]  ROUNDING NOTE   Subjective:  Patient sitting up in chair this AM. Currently off the ventilator and on T-piece. Due for hemodialysis treatment today.   Objective:  Vital signs in last 24 hours:  Temperature nine 9.8 pulse 91 respirations 20 blood pressure 169/73  Physical Exam: General:  No acute distress  Head:  Normocephalic, atraumatic. Moist oral mucosal membranes  Eyes:  Anicteric  Neck:  tracheostomy in place  Lungs:   Scattered rhonchi, normal effort  Heart:  S1S2 no rubs  Abdomen:   Soft, nontender, bowel sounds present  Extremities:  Left BKA.  Trace right lower extremity edema  Neurologic:  Not following commands  Skin:  No lesions  Access:  Left IJ PermCath    Basic Metabolic Panel: Recent Labs  Lab 12/01/20 0630 12/03/20 0917  NA 127* 133*  K 3.8 4.0  CL 89* 96*  CO2 26 26  GLUCOSE 125* 167*  BUN 58* 71*  CREATININE 4.42* 4.23*  CALCIUM 8.3* 9.3  PHOS 3.8 3.7    Liver Function Tests: Recent Labs  Lab 12/01/20 0630 12/03/20 0917  ALBUMIN 1.7* 2.1*   No results for input(s): LIPASE, AMYLASE in the last 168 hours. No results for input(s): AMMONIA in the last 168 hours.  CBC: Recent Labs  Lab 12/01/20 0630 12/02/20 0405 12/03/20 0917  WBC 10.6*  --  16.9*  HGB 7.0* 8.7* 8.6*  HCT 21.7* 27.2* 26.6*  MCV 95.6  --  96.7  PLT 337  --  431*    Cardiac Enzymes: No results for input(s): CKTOTAL, CKMB, CKMBINDEX, TROPONINI in the last 168 hours.  BNP: Invalid input(s): POCBNP  CBG: No results for input(s): GLUCAP in the last 168 hours.  Microbiology: Results for orders placed or performed during the hospital encounter of 11/21/20  Culture, blood (routine x 2)     Status: None   Collection Time: 11/25/20  3:25 PM   Specimen: BLOOD RIGHT HAND  Result Value Ref Range Status   Specimen Description BLOOD RIGHT HAND  Final   Special Requests   Final    BOTTLES DRAWN AEROBIC ONLY Blood Culture results may not be  optimal due to an inadequate volume of blood received in culture bottles   Culture   Final    NO GROWTH 5 DAYS Performed at [facility], [address], [place], [place] [address]    Report Status 11/30/2020 FINAL  Final  Culture, blood (routine x 2)     Status: None   Collection Time: 11/25/20  3:29 PM   Specimen: BLOOD LEFT HAND  Result Value Ref Range Status   Specimen Description BLOOD LEFT HAND  Final   Special Requests   Final    BOTTLES DRAWN AEROBIC AND ANAEROBIC Blood Culture adequate volume   Culture   Final    NO GROWTH 5 DAYS Performed at [facility], [address] [place], [place] [address]    Report Status 11/30/2020 FINAL  Final    Coagulation Studies: No results for input(s): LABPROT, INR in the last 72 hours.  Urinalysis: No results for input(s): COLORURINE, LABSPEC, PHURINE, GLUCOSEU, HGBUR, BILIRUBINUR, KETONESUR, PROTEINUR, UROBILINOGEN, NITRITE, LEUKOCYTESUR in the last 72 hours.  Invalid input(s): APPERANCEUR    Imaging: No results found.   Medications:       Assessment/ Plan:  65 y.o. male with a PMHx of ESRD on HD TTS, anemia of chronic kidney disease, hypertension, acute respiratory failure status post tracheostomy placement,  seizure disorder, hyperlipidemia, diabetes mellitus type 2, history of hyperkalemia, history of coronary artery disease, peripheral vascular disease who was admitted to [facility] on 11/21/2020 for ongoing treatment of acute respiratory failure status post tracheostomy placement and ESRD.  1.  ESRD on HD.    Patient due for hemodialysis treatment today.  Recheck serum electrolytes today.  2.  Acute respiratory failure.  Currently off the ventilator and maintained on T-piece.  3.  Anemia of chronic kidney disease.  Hemoglobin up to 8.6.  Maintain the patient on Retacrit 4000 units IV with dialysis treatments.  4.  Secondary hyperparathyroidism.  Phosphorus 3.7 at last check.   LOS: 0 Joel Hebert 5/30/20229:44 AM

## 2020-12-07 DIAGNOSIS — J9621 Acute and chronic respiratory failure with hypoxia: Secondary | ICD-10-CM | POA: Diagnosis not present

## 2020-12-07 DIAGNOSIS — J151 Pneumonia due to Pseudomonas: Secondary | ICD-10-CM | POA: Diagnosis not present

## 2020-12-07 DIAGNOSIS — G931 Anoxic brain damage, not elsewhere classified: Secondary | ICD-10-CM | POA: Diagnosis not present

## 2020-12-07 DIAGNOSIS — N179 Acute kidney failure, unspecified: Secondary | ICD-10-CM | POA: Diagnosis not present

## 2020-12-07 NOTE — Progress Notes (Signed)
Pulmonary Critical Care Medicine [facility] [place]   PULMONARY CRITICAL CARE SERVICE  PROGRESS NOTE     Joel Hebert  [id number]:[id number]  DOB: [birth year]   DOA: 11/21/2020  Referring Physician: Carron Curie, MD  HPI: Joel Hebert is a 65 y.o. male seen for follow up of Acute on Chronic Respiratory Failure.  Patient is currently on T collar on 28% FiO2 completing 48 hours.  Medications: Reviewed on Rounds  Physical Exam:  Vitals: Temperature is 99.6 pulse 84 respiratory rate is 28 blood pressure is 146/65 saturations 100%  Ventilator Settings currently on T collar FiO2 of 28%  . General: Comfortable at this time . Eyes: Grossly normal lids, irises & conjunctiva . ENT: grossly tongue is normal . Neck: no obvious mass . Cardiovascular: S1 S2 normal no gallop . Respiratory: Scattered rhonchi expansion is equal . Abdomen: soft . Skin: no rash seen on limited exam . Musculoskeletal: not rigid . Psychiatric:unable to assess . Neurologic: no seizure no involuntary movements         Lab Data:   Basic Metabolic Panel: Recent Labs  Lab 12/01/20 0630 12/03/20 0917 12/06/20 0839  NA 127* 133* 132*  K 3.8 4.0 3.8  CL 89* 96* 94*  CO2 26 26 27   GLUCOSE 125* 167* 195*  BUN 58* 71* 85*  CREATININE 4.42* 4.23* 3.99*  CALCIUM 8.3* 9.3 9.4  PHOS 3.8 3.7 3.4    ABG: Recent Labs  Lab 12/06/20 0820  PHART 7.423  PCO2ART 46.0  PO2ART 42.6*  HCO3 29.5*  O2SAT 78.3    Liver Function Tests: Recent Labs  Lab 12/01/20 0630 12/03/20 0917 12/06/20 0839  ALBUMIN 1.7* 2.1* 2.0*   No results for input(s): LIPASE, AMYLASE in the last 168 hours. No results for input(s): AMMONIA in the last 168 hours.  CBC: Recent Labs  Lab 12/01/20 0630 12/02/20 0405 12/03/20 0917  WBC 10.6*  --  16.9*  HGB 7.0* 8.7* 8.6*  HCT 21.7* 27.2* 26.6*  MCV 95.6  --  96.7  PLT 337  --  431*    Cardiac Enzymes: No results for input(s): CKTOTAL, CKMB, CKMBINDEX,  TROPONINI in the last 168 hours.  BNP (last 3 results) No results for input(s): BNP in the last 8760 hours.  ProBNP (last 3 results) No results for input(s): PROBNP in the last 8760 hours.  Radiological Exams: DG CHEST PORT 1 VIEW  Result Date: 12/06/2020 CLINICAL DATA:  Fever, leukocytosis EXAM: PORTABLE CHEST 1 VIEW COMPARISON:  11/21/2020 FINDINGS: Tracheostomy in satisfactory position. Left IJ venous catheter terminates at the cavoatrial junction. Low lung volumes with bibasilar opacities, likely atelectasis. No pleural effusion or pneumothorax. The heart is normal in size. IMPRESSION: Low lung volumes with bibasilar opacities, likely atelectasis. Electronically Signed   By: 11/23/2020 M.D.   On: 12/06/2020 11:00    Assessment/Plan Active Problems:   Acute on chronic respiratory failure with hypoxia (HCC)   Anoxic brain injury (HCC)   Acute renal failure superimposed on chronic kidney disease (HCC)   Pneumonia due to Pseudomonas (HCC)   1. Acute on chronic respiratory failure with hypoxia patient continues to be on T collar has completing 48 hours. 2. Anoxic brain injury no change the patient is at baseline 3. Acute renal failure following up on labs 4. Pneumonia due to Pseudomonas has been treated with antibiotics continue to monitor closely chest x-ray results noted as above   I have personally seen and evaluated the patient, evaluated laboratory and  imaging results, formulated the assessment and plan and placed orders. The Patient requires high complexity decision making with multiple systems involvement.  Rounds were done with the Respiratory Therapy Director and Staff therapists and discussed with nursing staff also.  Allyne Gee, MD [facility] Pulmonary Critical Care Medicine Sleep Medicine

## 2020-12-08 DIAGNOSIS — J9621 Acute and chronic respiratory failure with hypoxia: Secondary | ICD-10-CM | POA: Diagnosis not present

## 2020-12-08 DIAGNOSIS — N179 Acute kidney failure, unspecified: Secondary | ICD-10-CM | POA: Diagnosis not present

## 2020-12-08 DIAGNOSIS — G931 Anoxic brain damage, not elsewhere classified: Secondary | ICD-10-CM | POA: Diagnosis not present

## 2020-12-08 DIAGNOSIS — J151 Pneumonia due to Pseudomonas: Secondary | ICD-10-CM | POA: Diagnosis not present

## 2020-12-08 LAB — CBC
HCT: 23.3 % — ABNORMAL LOW (ref 39.0–52.0)
Hemoglobin: 7.5 g/dL — ABNORMAL LOW (ref 13.0–17.0)
MCH: 30.1 pg (ref 26.0–34.0)
MCHC: 32.2 g/dL (ref 30.0–36.0)
MCV: 93.6 fL (ref 80.0–100.0)
Platelets: 442 10*3/uL — ABNORMAL HIGH (ref 150–400)
RBC: 2.49 MIL/uL — ABNORMAL LOW (ref 4.22–5.81)
RDW: 17.6 % — ABNORMAL HIGH (ref 11.5–15.5)
WBC: 17.1 10*3/uL — ABNORMAL HIGH (ref 4.0–10.5)
nRBC: 0 % (ref 0.0–0.2)

## 2020-12-08 LAB — RENAL FUNCTION PANEL
Albumin: 2 g/dL — ABNORMAL LOW (ref 3.5–5.0)
Anion gap: 13 (ref 5–15)
BUN: 77 mg/dL — ABNORMAL HIGH (ref 8–23)
CO2: 25 mmol/L (ref 22–32)
Calcium: 9.1 mg/dL (ref 8.9–10.3)
Chloride: 93 mmol/L — ABNORMAL LOW (ref 98–111)
Creatinine, Ser: 3.28 mg/dL — ABNORMAL HIGH (ref 0.61–1.24)
GFR, Estimated: 20 mL/min — ABNORMAL LOW (ref >60)
Glucose, Bld: 169 mg/dL — ABNORMAL HIGH (ref 70–99)
Phosphorus: 2 mg/dL — ABNORMAL LOW (ref 2.5–4.6)
Potassium: 4.3 mmol/L (ref 3.5–5.1)
Sodium: 131 mmol/L — ABNORMAL LOW (ref 135–145)

## 2020-12-08 NOTE — Progress Notes (Signed)
Pulmonary Critical Care Medicine [facility] [place]   PULMONARY CRITICAL CARE SERVICE  PROGRESS NOTE     Joel Hebert  [id number]  DOB: [birth year]   DOA: 11/21/2020  Referring Physician: Carron Curie, MD  HPI: Joel Hebert is a 65 y.o. male seen for follow up of Acute on Chronic Respiratory Failure.  Patient is currently on room air T collar has a cuffless trach secretions are still significant discussed the possibility of capping  Medications: Reviewed on Rounds  Physical Exam:  Vitals: Temperature is 99.8 pulse 94 respiratory rate is 15 blood pressure 136/62 saturations 96%  Ventilator Settings on T collar room air  . General: Comfortable at this time . Eyes: Grossly normal lids, irises & conjunctiva . ENT: grossly tongue is normal . Neck: no obvious mass . Cardiovascular: S1 S2 normal no gallop . Respiratory: No rhonchi very coarse breath sound . Abdomen: soft . Skin: no rash seen on limited exam . Musculoskeletal: not rigid . Psychiatric:unable to assess . Neurologic: no seizure no involuntary movements         Lab Data:   Basic Metabolic Panel: Recent Labs  Lab 12/03/20 0917 12/06/20 0839 12/08/20 0924  NA 133* 132* 131*  K 4.0 3.8 4.3  CL 96* 94* 93*  CO2 26 27 25   GLUCOSE 167* 195* 169*  BUN 71* 85* 77*  CREATININE 4.23* 3.99* 3.28*  CALCIUM 9.3 9.4 9.1  PHOS 3.7 3.4 2.0*    ABG: Recent Labs  Lab 12/06/20 0820  PHART 7.423  PCO2ART 46.0  PO2ART 42.6*  HCO3 29.5*  O2SAT 78.3    Liver Function Tests: Recent Labs  Lab 12/03/20 0917 12/06/20 0839 12/08/20 0924  ALBUMIN 2.1* 2.0* 2.0*   No results for input(s): LIPASE, AMYLASE in the last 168 hours. No results for input(s): AMMONIA in the last 168 hours.  CBC: Recent Labs  Lab 12/02/20 0405 12/03/20 0917 12/08/20 0924  WBC  --  16.9* 17.1*  HGB 8.7* 8.6* 7.5*  HCT 27.2* 26.6* 23.3*  MCV  --  96.7 93.6  PLT  --  431* 442*    Cardiac  Enzymes: No results for input(s): CKTOTAL, CKMB, CKMBINDEX, TROPONINI in the last 168 hours.  BNP (last 3 results) No results for input(s): BNP in the last 8760 hours.  ProBNP (last 3 results) No results for input(s): PROBNP in the last 8760 hours.  Radiological Exams: DG CHEST PORT 1 VIEW  Result Date: 12/06/2020 CLINICAL DATA:  Fever, leukocytosis EXAM: PORTABLE CHEST 1 VIEW COMPARISON:  11/21/2020 FINDINGS: Tracheostomy in satisfactory position. Left IJ venous catheter terminates at the cavoatrial junction. Low lung volumes with bibasilar opacities, likely atelectasis. No pleural effusion or pneumothorax. The heart is normal in size. IMPRESSION: Low lung volumes with bibasilar opacities, likely atelectasis. Electronically Signed   By: 11/23/2020 M.D.   On: 12/06/2020 11:00    Assessment/Plan Active Problems:   Acute on chronic respiratory failure with hypoxia (HCC)   Anoxic brain injury (HCC)   Acute renal failure superimposed on chronic kidney disease (HCC)   Pneumonia due to Pseudomonas (HCC)   1. Acute on chronic respiratory failure with hypoxia we will continue with T collar trials titrate oxygen as tolerated continue pulmonary toilet. 2. Anoxic brain injury no change we will continue to follow 3. Acute renal failure on chronic renal failure we will continue to monitor closely. 4. Pneumonia due to Pseudomonas has been treated   I have personally seen and evaluated the  patient, evaluated laboratory and imaging results, formulated the assessment and plan and placed orders. The Patient requires high complexity decision making with multiple systems involvement.  Rounds were done with the Respiratory Therapy Director and Staff therapists and discussed with nursing staff also.  Allyne Gee, MD [facility] Pulmonary Critical Care Medicine Sleep Medicine

## 2020-12-09 DIAGNOSIS — N179 Acute kidney failure, unspecified: Secondary | ICD-10-CM | POA: Diagnosis not present

## 2020-12-09 DIAGNOSIS — J9621 Acute and chronic respiratory failure with hypoxia: Secondary | ICD-10-CM | POA: Diagnosis not present

## 2020-12-09 DIAGNOSIS — G931 Anoxic brain damage, not elsewhere classified: Secondary | ICD-10-CM | POA: Diagnosis not present

## 2020-12-09 DIAGNOSIS — J151 Pneumonia due to Pseudomonas: Secondary | ICD-10-CM | POA: Diagnosis not present

## 2020-12-09 LAB — CULTURE, RESPIRATORY W GRAM STAIN

## 2020-12-09 NOTE — Progress Notes (Signed)
[facility]  ROUNDING NOTE   Subjective:  Patient currently resting comfortably in bed. Currently on T-piece. Underwent dialysis yesterday as patient could not be dialyzed yesterday due to heavy patient load.    Objective:  Vital signs in last 24 hours:  Temperature 98.5 pulse 90 respirations 22 blood pressure 158/76  Physical Exam: General:  No acute distress  Head:  Normocephalic, atraumatic. Moist oral mucosal membranes  Eyes:  Anicteric  Neck:  tracheostomy in place  Lungs:   Scattered rhonchi, normal effort  Heart:  S1S2 no rubs  Abdomen:   Soft, nontender, bowel sounds present  Extremities:  Left BKA.  Trace right lower extremity edema  Neurologic:  Not following commands  Skin:  No lesions  Access:  Left IJ PermCath    Basic Metabolic Panel: Recent Labs  Lab 12/03/20 0917 12/06/20 0839 12/08/20 0924  NA 133* 132* 131*  K 4.0 3.8 4.3  CL 96* 94* 93*  CO2 26 27 25   GLUCOSE 167* 195* 169*  BUN 71* 85* 77*  CREATININE 4.23* 3.99* 3.28*  CALCIUM 9.3 9.4 9.1  PHOS 3.7 3.4 2.0*    Liver Function Tests: Recent Labs  Lab 12/03/20 0917 12/06/20 0839 12/08/20 0924  ALBUMIN 2.1* 2.0* 2.0*   No results for input(s): LIPASE, AMYLASE in the last 168 hours. No results for input(s): AMMONIA in the last 168 hours.  CBC: Recent Labs  Lab 12/03/20 0917 12/08/20 0924  WBC 16.9* 17.1*  HGB 8.6* 7.5*  HCT 26.6* 23.3*  MCV 96.7 93.6  PLT 431* 442*    Cardiac Enzymes: No results for input(s): CKTOTAL, CKMB, CKMBINDEX, TROPONINI in the last 168 hours.  BNP: Invalid input(s): POCBNP  CBG: No results for input(s): GLUCAP in the last 168 hours.  Microbiology: Results for orders placed or performed during the hospital encounter of 11/21/20  Culture, blood (routine x 2)     Status: None   Collection Time: 11/25/20  3:25 PM   Specimen: BLOOD RIGHT HAND  Result Value Ref Range Status   Specimen Description BLOOD RIGHT HAND  Final   Special  Requests   Final    BOTTLES DRAWN AEROBIC ONLY Blood Culture results may not be optimal due to an inadequate volume of blood received in culture bottles   Culture   Final    NO GROWTH 5 DAYS Performed at [facility], [address], [place], [place]    Report Status 11/30/2020 FINAL  Final  Culture, blood (routine x 2)     Status: None   Collection Time: 11/25/20  3:29 PM   Specimen: BLOOD LEFT HAND  Result Value Ref Range Status   Specimen Description BLOOD LEFT HAND  Final   Special Requests   Final    BOTTLES DRAWN AEROBIC AND ANAEROBIC Blood Culture adequate volume   Culture   Final    NO GROWTH 5 DAYS Performed at [facility], [address], [place], [place]    Report Status 11/30/2020 FINAL  Final  Culture, Respiratory w Gram Stain     Status: None   Collection Time: 12/06/20 10:40 AM   Specimen: Tracheal Aspirate; Respiratory  Result Value Ref Range Status   Specimen Description TRACHEAL ASPIRATE  Final   Special Requests NONE  Final   Gram Stain   Final    ABUNDANT WBC PRESENT, PREDOMINANTLY PMN ABUNDANT GRAM NEGATIVE RODS FEW GRAM POSITIVE COCCI FEW GRAM POSITIVE RODS    Culture  Final    MODERATE PSEUDOMONAS AERUGINOSA Two isolates with different morphologies were identified as the same organism.The most resistant organism was reported. Performed at [facility], [address] [place], [place] [address]    Report Status 12/09/2020 FINAL  Final   Organism ID, Bacteria PSEUDOMONAS AERUGINOSA  Final      Susceptibility   Pseudomonas aeruginosa - MIC*    CEFTAZIDIME 16 INTERMEDIATE Intermediate     CIPROFLOXACIN 1 SENSITIVE Sensitive     GENTAMICIN 2 SENSITIVE Sensitive     IMIPENEM >=16 RESISTANT Resistant     * MODERATE PSEUDOMONAS AERUGINOSA    Coagulation Studies: No results for input(s): LABPROT, INR in the last 72 hours.  Urinalysis: No results for input(s): COLORURINE, LABSPEC, PHURINE, GLUCOSEU, HGBUR,  BILIRUBINUR, KETONESUR, PROTEINUR, UROBILINOGEN, NITRITE, LEUKOCYTESUR in the last 72 hours.  Invalid input(s): APPERANCEUR    Imaging: No results found.   Medications:       Assessment/ Plan:  65 y.o. male with a PMHx of ESRD on HD TTS, anemia of chronic kidney disease, hypertension, acute respiratory failure status post tracheostomy placement, seizure disorder, hyperlipidemia, diabetes mellitus type 2, history of hyperkalemia, history of coronary artery disease, peripheral vascular disease who was admitted to [facility] on 11/21/2020 for ongoing treatment of acute respiratory failure status post tracheostomy placement and ESRD.  1.  ESRD on HD.    Patient off schedule a bit.  He had dialysis today.  We will plan for dialysis again tomorrow.  2.  Acute respiratory failure.  Patient tolerating T-piece quite well.  Pulmonary/critical care following.  3.  Anemia of chronic kidney disease.  Hemoglobin dropped a bit to 7.5 this AM.  May in part be due to hemodilution due to missed dialysis on Wednesday.  Reassess tomorrow.  4.  Secondary hyperparathyroidism.  Recheck serum phosphorus tomorrow.   LOS: 0 Emmanuella Mirante 6/2/20227:55 PM

## 2020-12-09 NOTE — Progress Notes (Signed)
Pulmonary Critical Care Medicine [facility] [place]   PULMONARY CRITICAL CARE SERVICE  PROGRESS NOTE     Danney Bungert  DZH:[id number]  DOB: 1955-07-31   DOA: 11/21/2020  Referring Physician: Carron Curie, MD  HPI: Etai Copado is a 65 y.o. male seen for follow up of Acute on Chronic Respiratory Failure.  Patient is afebrile resting comfortably right now without distress remains on T collar has been on room air  Medications: Reviewed on Rounds  Physical Exam:  Vitals: Temperature is 98.5 pulse 90 respiratory rate 22 blood pressure is 158/76 saturations 95 to  Ventilator Settings on T collar currently has been on room air  . General: Comfortable at this time . Eyes: Grossly normal lids, irises & conjunctiva . ENT: grossly tongue is normal . Neck: no obvious mass . Cardiovascular: S1 S2 normal no gallop . Respiratory: Scattered rhonchi expansion . Abdomen: soft . Skin: no rash seen on limited exam . Musculoskeletal: not rigid . Psychiatric:unable to assess . Neurologic: no seizure no involuntary movements         Lab Data:   Basic Metabolic Panel: Recent Labs  Lab 12/03/20 0917 12/06/20 0839 12/08/20 0924  NA 133* 132* 131*  K 4.0 3.8 4.3  CL 96* 94* 93*  CO2 26 27 25   GLUCOSE 167* 195* 169*  BUN 71* 85* 77*  CREATININE 4.23* 3.99* 3.28*  CALCIUM 9.3 9.4 9.1  PHOS 3.7 3.4 2.0*    ABG: Recent Labs  Lab 12/06/20 0820  PHART 7.423  PCO2ART 46.0  PO2ART 42.6*  HCO3 29.5*  O2SAT 78.3    Liver Function Tests: Recent Labs  Lab 12/03/20 0917 12/06/20 0839 12/08/20 0924  ALBUMIN 2.1* 2.0* 2.0*   No results for input(s): LIPASE, AMYLASE in the last 168 hours. No results for input(s): AMMONIA in the last 168 hours.  CBC: Recent Labs  Lab 12/03/20 0917 12/08/20 0924  WBC 16.9* 17.1*  HGB 8.6* 7.5*  HCT 26.6* 23.3*  MCV 96.7 93.6  PLT 431* 442*    Cardiac Enzymes: No results for input(s): CKTOTAL, CKMB, CKMBINDEX,  TROPONINI in the last 168 hours.  BNP (last 3 results) No results for input(s): BNP in the last 8760 hours.  ProBNP (last 3 results) No results for input(s): PROBNP in the last 8760 hours.  Radiological Exams: No results found.  Assessment/Plan Active Problems:   Acute on chronic respiratory failure with hypoxia (HCC)   Anoxic brain injury (HCC)   Acute renal failure superimposed on chronic kidney disease (HCC)   Pneumonia due to Pseudomonas (HCC)   1. Acute on chronic respiratory failure hypoxia on T collar currently on room air seems to be comfortable right now without distress.  We will try PMV and capping 2. Anoxic brain injury overall no change 3. Acute renal failure we will continue with supportive care 4. Pneumonia due to Pseudomonas has been treated   I have personally seen and evaluated the patient, evaluated laboratory and imaging results, formulated the assessment and plan and placed orders. The Patient requires high complexity decision making with multiple systems involvement.  Rounds were done with the Respiratory Therapy Director and Staff therapists and discussed with nursing staff also.  02/07/21, MD [facility] Pulmonary Critical Care Medicine Sleep Medicine

## 2020-12-10 LAB — CBC
HCT: 22.8 % — ABNORMAL LOW (ref 39.0–52.0)
Hemoglobin: 7.3 g/dL — ABNORMAL LOW (ref 13.0–17.0)
MCH: 30.5 pg (ref 26.0–34.0)
MCHC: 32 g/dL (ref 30.0–36.0)
MCV: 95.4 fL (ref 80.0–100.0)
Platelets: 480 10*3/uL — ABNORMAL HIGH (ref 150–400)
RBC: 2.39 MIL/uL — ABNORMAL LOW (ref 4.22–5.81)
RDW: 18.2 % — ABNORMAL HIGH (ref 11.5–15.5)
WBC: 13.9 10*3/uL — ABNORMAL HIGH (ref 4.0–10.5)
nRBC: 0 % (ref 0.0–0.2)

## 2020-12-10 LAB — RENAL FUNCTION PANEL
Albumin: 1.8 g/dL — ABNORMAL LOW (ref 3.5–5.0)
Anion gap: 8 (ref 5–15)
BUN: 87 mg/dL — ABNORMAL HIGH (ref 8–23)
CO2: 28 mmol/L (ref 22–32)
Calcium: 9.5 mg/dL (ref 8.9–10.3)
Chloride: 99 mmol/L (ref 98–111)
Creatinine, Ser: 3.17 mg/dL — ABNORMAL HIGH (ref 0.61–1.24)
GFR, Estimated: 21 mL/min — ABNORMAL LOW (ref >60)
Glucose, Bld: 150 mg/dL — ABNORMAL HIGH (ref 70–99)
Phosphorus: 1.8 mg/dL — ABNORMAL LOW (ref 2.5–4.6)
Potassium: 3.5 mmol/L (ref 3.5–5.1)
Sodium: 135 mmol/L (ref 135–145)

## 2020-12-10 NOTE — Progress Notes (Signed)
[facility]  ROUNDING NOTE   Subjective:  Patient seen and evaluated at bedside. Resting comfortably. OT working with the patient. Patient due for hemodialysis today. Still not following commands.    Objective:  Vital signs in last 24 hours:  Temperature 98.5 pulse 88 respirations 20 blood pressure 146/69  Physical Exam: General:  No acute distress  Head:  Normocephalic, atraumatic. Moist oral mucosal membranes  Eyes:  Anicteric  Neck:  tracheostomy in place  Lungs:   Scattered rhonchi, normal effort  Heart:  S1S2 no rubs  Abdomen:   Soft, nontender, bowel sounds present  Extremities:  Left BKA.  Trace right lower extremity edema  Neurologic:  Not following commands  Skin:  No lesions  Access:  Left IJ PermCath    Basic Metabolic Panel: Recent Labs  Lab 12/03/20 0917 12/06/20 0839 12/08/20 0924  NA 133* 132* 131*  K 4.0 3.8 4.3  CL 96* 94* 93*  CO2 26 27 25   GLUCOSE 167* 195* 169*  BUN 71* 85* 77*  CREATININE 4.23* 3.99* 3.28*  CALCIUM 9.3 9.4 9.1  PHOS 3.7 3.4 2.0*    Liver Function Tests: Recent Labs  Lab 12/03/20 0917 12/06/20 0839 12/08/20 0924  ALBUMIN 2.1* 2.0* 2.0*   No results for input(s): LIPASE, AMYLASE in the last 168 hours. No results for input(s): AMMONIA in the last 168 hours.  CBC: Recent Labs  Lab 12/03/20 0917 12/08/20 0924  WBC 16.9* 17.1*  HGB 8.6* 7.5*  HCT 26.6* 23.3*  MCV 96.7 93.6  PLT 431* 442*    Cardiac Enzymes: No results for input(s): CKTOTAL, CKMB, CKMBINDEX, TROPONINI in the last 168 hours.  BNP: Invalid input(s): POCBNP  CBG: No results for input(s): GLUCAP in the last 168 hours.  Microbiology: Results for orders placed or performed during the hospital encounter of 11/21/20  Culture, blood (routine x 2)     Status: None   Collection Time: 11/25/20  3:25 PM   Specimen: BLOOD RIGHT HAND  Result Value Ref Range Status   Specimen Description BLOOD RIGHT HAND  Final   Special Requests    Final    BOTTLES DRAWN AEROBIC ONLY Blood Culture results may not be optimal due to an inadequate volume of blood received in culture bottles   Culture   Final    NO GROWTH 5 DAYS Performed at [facility], [address], [place], [place]    Report Status 11/30/2020 FINAL  Final  Culture, blood (routine x 2)     Status: None   Collection Time: 11/25/20  3:29 PM   Specimen: BLOOD LEFT HAND  Result Value Ref Range Status   Specimen Description BLOOD LEFT HAND  Final   Special Requests   Final    BOTTLES DRAWN AEROBIC AND ANAEROBIC Blood Culture adequate volume   Culture   Final    NO GROWTH 5 DAYS Performed at [facility], [address] [place], [place]    Report Status 11/30/2020 FINAL  Final  Culture, Respiratory w Gram Stain     Status: None   Collection Time: 12/06/20 10:40 AM   Specimen: Tracheal Aspirate; Respiratory  Result Value Ref Range Status   Specimen Description TRACHEAL ASPIRATE  Final   Special Requests NONE  Final   Gram Stain   Final    ABUNDANT WBC PRESENT, PREDOMINANTLY PMN ABUNDANT GRAM NEGATIVE RODS FEW GRAM POSITIVE COCCI FEW GRAM POSITIVE RODS    Culture   Final  MODERATE PSEUDOMONAS AERUGINOSA Two isolates with different morphologies were identified as the same organism.The most resistant organism was reported. Performed at [facility], [address], [place], [place] [address]    Report Status 12/09/2020 FINAL  Final   Organism ID, Bacteria PSEUDOMONAS AERUGINOSA  Final      Susceptibility   Pseudomonas aeruginosa - MIC*    CEFTAZIDIME 16 INTERMEDIATE Intermediate     CIPROFLOXACIN 1 SENSITIVE Sensitive     GENTAMICIN 2 SENSITIVE Sensitive     IMIPENEM >=16 RESISTANT Resistant     * MODERATE PSEUDOMONAS AERUGINOSA    Coagulation Studies: No results for input(s): LABPROT, INR in the last 72 hours.  Urinalysis: No results for input(s): COLORURINE, LABSPEC, PHURINE, GLUCOSEU, HGBUR, BILIRUBINUR,  KETONESUR, PROTEINUR, UROBILINOGEN, NITRITE, LEUKOCYTESUR in the last 72 hours.  Invalid input(s): APPERANCEUR    Imaging: No results found.   Medications:       Assessment/ Plan:  65 y.o. male with a PMHx of ESRD on HD TTS, anemia of chronic kidney disease, hypertension, acute respiratory failure status post tracheostomy placement, seizure disorder, hyperlipidemia, diabetes mellitus type 2, history of hyperkalemia, history of coronary artery disease, peripheral vascular disease who was admitted to [facility] on 11/21/2020 for ongoing treatment of acute respiratory failure status post tracheostomy placement and ESRD.  1.  ESRD on HD.    Patient due for hemodialysis treatment today.  Continue ultrafiltration as tolerated.  2.  Acute respiratory failure.  Patient still on T-piece and appears to be tolerating this well.  Pulmonary/critical care following.  3.  Anemia of chronic kidney disease.  Hemoglobin 7.5 at last check.  Maintain the patient on Retacrit 4000 units IV with dialysis treatments.  4.  Secondary hyperparathyroidism.  Recheck serum phosphorus this AM.   LOS: 0 Loye Reininger 6/3/20227:54 AM

## 2020-12-12 DIAGNOSIS — N179 Acute kidney failure, unspecified: Secondary | ICD-10-CM | POA: Diagnosis not present

## 2020-12-12 DIAGNOSIS — J9621 Acute and chronic respiratory failure with hypoxia: Secondary | ICD-10-CM | POA: Diagnosis not present

## 2020-12-12 DIAGNOSIS — G931 Anoxic brain damage, not elsewhere classified: Secondary | ICD-10-CM | POA: Diagnosis not present

## 2020-12-12 DIAGNOSIS — J151 Pneumonia due to Pseudomonas: Secondary | ICD-10-CM | POA: Diagnosis not present

## 2020-12-12 NOTE — Progress Notes (Signed)
Pulmonary Critical Care Medicine [facility] [place]   PULMONARY CRITICAL CARE SERVICE  PROGRESS NOTE     Jonpaul Lumm  OVZ:[id number]  DOB: 01/22/1956   DOA: 11/21/2020  Referring Physician: Carron Curie, MD  HPI: Joel Hebert is a 65 y.o. male seen for follow up of Acute on Chronic Respiratory Failure.  Patient is currently on T collar has been on room air secretions still remain an issue  Medications: Reviewed on Rounds  Physical Exam:  Vitals: Temperature is 97.0 pulse 92 respiratory 30 blood pressure is 113/52 saturations 98%  Ventilator Settings on T collar room air  . General: Comfortable at this time . Eyes: Grossly normal lids, irises & conjunctiva . ENT: grossly tongue is normal . Neck: no obvious mass . Cardiovascular: S1 S2 normal no gallop . Respiratory: No rhonchi no rales are noted at this time . Abdomen: soft . Skin: no rash seen on limited exam . Musculoskeletal: not rigid . Psychiatric:unable to assess . Neurologic: no seizure no involuntary movements         Lab Data:   Basic Metabolic Panel: Recent Labs  Lab 12/06/20 0839 12/08/20 0924 12/10/20 0930  NA 132* 131* 135  K 3.8 4.3 3.5  CL 94* 93* 99  CO2 27 25 28   GLUCOSE 195* 169* 150*  BUN 85* 77* 87*  CREATININE 3.99* 3.28* 3.17*  CALCIUM 9.4 9.1 9.5  PHOS 3.4 2.0* 1.8*    ABG: Recent Labs  Lab 12/06/20 0820  PHART 7.423  PCO2ART 46.0  PO2ART 42.6*  HCO3 29.5*  O2SAT 78.3    Liver Function Tests: Recent Labs  Lab 12/06/20 0839 12/08/20 0924 12/10/20 0930  ALBUMIN 2.0* 2.0* 1.8*   No results for input(s): LIPASE, AMYLASE in the last 168 hours. No results for input(s): AMMONIA in the last 168 hours.  CBC: Recent Labs  Lab 12/08/20 0924 12/10/20 0930  WBC 17.1* 13.9*  HGB 7.5* 7.3*  HCT 23.3* 22.8*  MCV 93.6 95.4  PLT 442* 480*    Cardiac Enzymes: No results for input(s): CKTOTAL, CKMB, CKMBINDEX, TROPONINI in the last 168  hours.  BNP (last 3 results) No results for input(s): BNP in the last 8760 hours.  ProBNP (last 3 results) No results for input(s): PROBNP in the last 8760 hours.  Radiological Exams: No results found.  Assessment/Plan Active Problems:   Acute on chronic respiratory failure with hypoxia (HCC)   Anoxic brain injury (HCC)   Acute renal failure superimposed on chronic kidney disease (HCC)   Pneumonia due to Pseudomonas (HCC)   1. Acute on chronic respiratory failure hypoxia we will continue with T collar trials titrate oxygen as tolerated continue pulmonary toilet. 2. Anoxic brain injury grossly unchanged we will continue with supportive care 3. Acute renal failure on chronic renal failure following up on labs nephrology is following 4. Pneumonia due to Pseudomonas has been treated with antibiotic   I have personally seen and evaluated the patient, evaluated laboratory and imaging results, formulated the assessment and plan and placed orders. The Patient requires high complexity decision making with multiple systems involvement.  Rounds were done with the Respiratory Therapy Director and Staff therapists and discussed with nursing staff also.  02/09/21, MD [facility] Pulmonary Critical Care Medicine Sleep Medicine

## 2020-12-13 DIAGNOSIS — J151 Pneumonia due to Pseudomonas: Secondary | ICD-10-CM | POA: Diagnosis not present

## 2020-12-13 DIAGNOSIS — G931 Anoxic brain damage, not elsewhere classified: Secondary | ICD-10-CM | POA: Diagnosis not present

## 2020-12-13 DIAGNOSIS — N179 Acute kidney failure, unspecified: Secondary | ICD-10-CM | POA: Diagnosis not present

## 2020-12-13 DIAGNOSIS — J9621 Acute and chronic respiratory failure with hypoxia: Secondary | ICD-10-CM | POA: Diagnosis not present

## 2020-12-13 LAB — CBC
HCT: 19.7 % — ABNORMAL LOW (ref 39.0–52.0)
Hemoglobin: 6.3 g/dL — CL (ref 13.0–17.0)
MCH: 30.7 pg (ref 26.0–34.0)
MCHC: 32 g/dL (ref 30.0–36.0)
MCV: 96.1 fL (ref 80.0–100.0)
Platelets: 528 10*3/uL — ABNORMAL HIGH (ref 150–400)
RBC: 2.05 MIL/uL — ABNORMAL LOW (ref 4.22–5.81)
RDW: 17.9 % — ABNORMAL HIGH (ref 11.5–15.5)
WBC: 18.7 10*3/uL — ABNORMAL HIGH (ref 4.0–10.5)
nRBC: 0 % (ref 0.0–0.2)

## 2020-12-13 LAB — RENAL FUNCTION PANEL
Albumin: 2 g/dL — ABNORMAL LOW (ref 3.5–5.0)
Anion gap: 15 (ref 5–15)
BUN: 125 mg/dL — ABNORMAL HIGH (ref 8–23)
CO2: 25 mmol/L (ref 22–32)
Calcium: 8.9 mg/dL (ref 8.9–10.3)
Chloride: 88 mmol/L — ABNORMAL LOW (ref 98–111)
Creatinine, Ser: 4.55 mg/dL — ABNORMAL HIGH (ref 0.61–1.24)
GFR, Estimated: 13 mL/min — ABNORMAL LOW (ref >60)
Glucose, Bld: 138 mg/dL — ABNORMAL HIGH (ref 70–99)
Phosphorus: 1.6 mg/dL — ABNORMAL LOW (ref 2.5–4.6)
Potassium: 4 mmol/L (ref 3.5–5.1)
Sodium: 128 mmol/L — ABNORMAL LOW (ref 135–145)

## 2020-12-13 LAB — PREPARE RBC (CROSSMATCH)

## 2020-12-13 NOTE — Progress Notes (Signed)
[facility]  ROUNDING NOTE   Subjective:  Patient noted to be febrile this a.m. with a temperature 100.6. Remains on T-piece. Due for hemodialysis treatment today.    Objective:  Vital signs in last 24 hours:  Temperature 100.6 pulse 94 respirations 31 blood pressure 102/48  Physical Exam: General:  No acute distress  Head:  Normocephalic, atraumatic. Moist oral mucosal membranes  Eyes:  Anicteric  Neck:  tracheostomy in place  Lungs:   Scattered rhonchi, normal effort  Heart:  S1S2 no rubs  Abdomen:   Soft, nontender, bowel sounds present  Extremities:  Left BKA.  1+ right lower extremity edema  Neurologic:  Will open eyes to command.  Skin:  No lesions  Access:  Left IJ PermCath    Basic Metabolic Panel: Recent Labs  Lab 12/06/20 0839 12/08/20 0924 12/10/20 0930  NA 132* 131* 135  K 3.8 4.3 3.5  CL 94* 93* 99  CO2 27 25 28   GLUCOSE 195* 169* 150*  BUN 85* 77* 87*  CREATININE 3.99* 3.28* 3.17*  CALCIUM 9.4 9.1 9.5  PHOS 3.4 2.0* 1.8*    Liver Function Tests: Recent Labs  Lab 12/06/20 0839 12/08/20 0924 12/10/20 0930  ALBUMIN 2.0* 2.0* 1.8*   No results for input(s): LIPASE, AMYLASE in the last 168 hours. No results for input(s): AMMONIA in the last 168 hours.  CBC: Recent Labs  Lab 12/08/20 0924 12/10/20 0930  WBC 17.1* 13.9*  HGB 7.5* 7.3*  HCT 23.3* 22.8*  MCV 93.6 95.4  PLT 442* 480*    Cardiac Enzymes: No results for input(s): CKTOTAL, CKMB, CKMBINDEX, TROPONINI in the last 168 hours.  BNP: Invalid input(s): POCBNP  CBG: No results for input(s): GLUCAP in the last 168 hours.  Microbiology: Results for orders placed or performed during the hospital encounter of 11/21/20  Culture, blood (routine x 2)     Status: None   Collection Time: 11/25/20  3:25 PM   Specimen: BLOOD RIGHT HAND  Result Value Ref Range Status   Specimen Description BLOOD RIGHT HAND  Final   Special Requests   Final    BOTTLES DRAWN AEROBIC ONLY  Blood Culture results may not be optimal due to an inadequate volume of blood received in culture bottles   Culture   Final    NO GROWTH 5 DAYS Performed at [facility], [address], [place], [place]    Report Status 11/30/2020 FINAL  Final  Culture, blood (routine x 2)     Status: None   Collection Time: 11/25/20  3:29 PM   Specimen: BLOOD LEFT HAND  Result Value Ref Range Status   Specimen Description BLOOD LEFT HAND  Final   Special Requests   Final    BOTTLES DRAWN AEROBIC AND ANAEROBIC Blood Culture adequate volume   Culture   Final    NO GROWTH 5 DAYS Performed at [facility], [address], [place], [place]    Report Status 11/30/2020 FINAL  Final  Culture, Respiratory w Gram Stain     Status: None   Collection Time: 12/06/20 10:40 AM   Specimen: Tracheal Aspirate; Respiratory  Result Value Ref Range Status   Specimen Description TRACHEAL ASPIRATE  Final   Special Requests NONE  Final   Gram Stain   Final    ABUNDANT WBC PRESENT, PREDOMINANTLY PMN ABUNDANT GRAM NEGATIVE RODS FEW GRAM POSITIVE COCCI FEW GRAM POSITIVE RODS    Culture   Final  MODERATE PSEUDOMONAS AERUGINOSA Two isolates with different morphologies were identified as the same organism.The most resistant organism was reported. Performed at [facility], [address], [place], [place] [address]    Report Status 12/09/2020 FINAL  Final   Organism ID, Bacteria PSEUDOMONAS AERUGINOSA  Final      Susceptibility   Pseudomonas aeruginosa - MIC*    CEFTAZIDIME 16 INTERMEDIATE Intermediate     CIPROFLOXACIN 1 SENSITIVE Sensitive     GENTAMICIN 2 SENSITIVE Sensitive     IMIPENEM >=16 RESISTANT Resistant     * MODERATE PSEUDOMONAS AERUGINOSA    Coagulation Studies: No results for input(s): LABPROT, INR in the last 72 hours.  Urinalysis: No results for input(s): COLORURINE, LABSPEC, PHURINE, GLUCOSEU, HGBUR, BILIRUBINUR, KETONESUR, PROTEINUR, UROBILINOGEN,  NITRITE, LEUKOCYTESUR in the last 72 hours.  Invalid input(s): APPERANCEUR    Imaging: No results found.   Medications:       Assessment/ Plan:  65 y.o. male with a PMHx of ESRD on HD TTS, anemia of chronic kidney disease, hypertension, acute respiratory failure status post tracheostomy placement, seizure disorder, hyperlipidemia, diabetes mellitus type 2, history of hyperkalemia, history of coronary artery disease, peripheral vascular disease who was admitted to [facility] on 11/21/2020 for ongoing treatment of acute respiratory failure status post tracheostomy placement and ESRD.  1.  ESRD on HD.    Maintain the patient on MWF dialysis schedule.  Patient due for dialysis treatment today.  Noted to be febrile today.  Obtain blood cultures x2 sets today.  Defer antibiotic management to hospitalist.  2.  Acute respiratory failure.  Remains on T-piece.  Pulmonary/critical care following.  3.  Anemia of chronic kidney disease.  Hemoglobin down to 7.3.  Continue Retacrit 4000 IV with dialysis.  Consider blood transfusion for hemoglobin of 7 or less.  4.  Secondary hyperparathyroidism.  Repeat serum phosphorus today.  At last check serum phosphorus was 1.8.   LOS: 0 Ozzie Knobel 6/6/20227:48 AM

## 2020-12-13 NOTE — Progress Notes (Signed)
Pulmonary Critical Care Medicine [facility]   PULMONARY CRITICAL CARE SERVICE  PROGRESS NOTE     Joel Hebert  VEL:[id number]  DOB: [birth year]   DOA: 11/21/2020  Referring Physician: Carron Curie, MD  HPI: Joel Hebert is a 65 y.o. male seen for follow up of Acute on Chronic Respiratory Failure.  Patient remains nonverbal and has been off the ventilator on T collar secretions are still significant  Medications: Reviewed on Rounds  Physical Exam:  Vitals: Temperature is 100.6 pulse 94 respiratory rate is 30 blood pressure is 102/48 saturations 96%  Ventilator Settings on T collar right now  . General: Comfortable at this time . ENT: grossly unremarkable . Neck: no obvious mass . Cardiovascular: no malignant arrhythmias . Respiratory: No rhonchi very coarse breath sounds . Abdomen: soft . Skin: no rash seen on limited exam . Musculoskeletal: not rigid . Psychiatric:unable to assess . Neurologic: no seizure no involuntary movements         Lab Data:   Basic Metabolic Panel: Recent Labs  Lab 12/08/20 0924 12/10/20 0930 12/13/20 0916  NA 131* 135 128*  K 4.3 3.5 4.0  CL 93* 99 88*  CO2 25 28 25   GLUCOSE 169* 150* 138*  BUN 77* 87* 125*  CREATININE 3.28* 3.17* 4.55*  CALCIUM 9.1 9.5 8.9  PHOS 2.0* 1.8* 1.6*    ABG: No results for input(s): PHART, PCO2ART, PO2ART, HCO3, O2SAT in the last 168 hours.  Liver Function Tests: Recent Labs  Lab 12/08/20 0924 12/10/20 0930 12/13/20 0916  ALBUMIN 2.0* 1.8* 2.0*   No results for input(s): LIPASE, AMYLASE in the last 168 hours. No results for input(s): AMMONIA in the last 168 hours.  CBC: Recent Labs  Lab 12/08/20 0924 12/10/20 0930  WBC 17.1* 13.9*  HGB 7.5* 7.3*  HCT 23.3* 22.8*  MCV 93.6 95.4  PLT 442* 480*    Cardiac Enzymes: No results for input(s): CKTOTAL, CKMB, CKMBINDEX, TROPONINI in the last 168 hours.  BNP (last 3 results) No results for input(s): BNP in  the last 8760 hours.  ProBNP (last 3 results) No results for input(s): PROBNP in the last 8760 hours.  Radiological Exams: No results found.  Assessment/Plan Active Problems:   Acute on chronic respiratory failure with hypoxia (HCC)   Anoxic brain injury (HCC)   Acute renal failure superimposed on chronic kidney disease (HCC)   Pneumonia due to Pseudomonas (HCC)   1. Acute on chronic respiratory failure hypoxia plan is going to be to continue with the T collar limiting factor his secretions still continue with pulmonary toilet supportive care 2. Anoxic brain injury has not really had much in the way of improvement 3. Acute renal failure on chronic renal failure being seen by nephrology for dialysis 4. Pneumonia due to Pseudomonas has been treated we will continue to monitor   I have personally seen and evaluated the patient, evaluated laboratory and imaging results, formulated the assessment and plan and placed orders. The Patient requires high complexity decision making with multiple systems involvement.  Rounds were done with the Respiratory Therapy Director and Staff therapists and discussed with nursing staff also.  02/09/21, MD [facility] Pulmonary Critical Care Medicine Sleep Medicine

## 2020-12-14 LAB — BLOOD CULTURE ID PANEL (REFLEXED) - BCID2

## 2020-12-14 LAB — HEMOGLOBIN AND HEMATOCRIT, BLOOD
HCT: 23.3 % — ABNORMAL LOW (ref 39.0–52.0)
Hemoglobin: 7.6 g/dL — ABNORMAL LOW (ref 13.0–17.0)

## 2020-12-14 LAB — TYPE AND SCREEN
ABO/RH(D): O POS
Antibody Screen: NEGATIVE
Unit division: 0

## 2020-12-14 LAB — BPAM RBC
Blood Product Expiration Date: 202206302359
ISSUE DATE / TIME: 202206061451
Unit Type and Rh: 5100

## 2020-12-15 LAB — RENAL FUNCTION PANEL
Albumin: 1.8 g/dL — ABNORMAL LOW (ref 3.5–5.0)
Anion gap: 11 (ref 5–15)
BUN: 121 mg/dL — ABNORMAL HIGH (ref 8–23)
CO2: 27 mmol/L (ref 22–32)
Calcium: 8.5 mg/dL — ABNORMAL LOW (ref 8.9–10.3)
Chloride: 89 mmol/L — ABNORMAL LOW (ref 98–111)
Creatinine, Ser: 4.08 mg/dL — ABNORMAL HIGH (ref 0.61–1.24)
GFR, Estimated: 15 mL/min — ABNORMAL LOW (ref >60)
Glucose, Bld: 169 mg/dL — ABNORMAL HIGH (ref 70–99)
Phosphorus: 1.6 mg/dL — ABNORMAL LOW (ref 2.5–4.6)
Potassium: 3.8 mmol/L (ref 3.5–5.1)
Sodium: 127 mmol/L — ABNORMAL LOW (ref 135–145)

## 2020-12-15 LAB — CBC
HCT: 24.9 % — ABNORMAL LOW (ref 39.0–52.0)
Hemoglobin: 8.3 g/dL — ABNORMAL LOW (ref 13.0–17.0)
MCH: 31.1 pg (ref 26.0–34.0)
MCHC: 33.3 g/dL (ref 30.0–36.0)
MCV: 93.3 fL (ref 80.0–100.0)
Platelets: 441 10*3/uL — ABNORMAL HIGH (ref 150–400)
RBC: 2.67 MIL/uL — ABNORMAL LOW (ref 4.22–5.81)
RDW: 17.4 % — ABNORMAL HIGH (ref 11.5–15.5)
WBC: 15.5 10*3/uL — ABNORMAL HIGH (ref 4.0–10.5)
nRBC: 0 % (ref 0.0–0.2)

## 2020-12-15 NOTE — Progress Notes (Signed)
[facility]  ROUNDING NOTE   Subjective:  Patient seen and evaluated at bedside. Continue dialysis on MWF schedule. Continues to have T-piece in place.    Objective:  Vital signs in last 24 hours:  Temperature 98.1 pulse 88 respirations 42 blood pressure 158/73  Physical Exam: General:  No acute distress  Head:  Normocephalic, atraumatic. Moist oral mucosal membranes  Eyes:  Anicteric  Neck:  tracheostomy in place  Lungs:   Scattered rhonchi, normal effort  Heart:  S1S2 no rubs  Abdomen:   Soft, nontender, bowel sounds present  Extremities:  Left BKA.  1+ right lower extremity edema  Neurologic:  Will open eyes to command.  Skin:  No lesions  Access:  Left IJ PermCath    Basic Metabolic Panel: Recent Labs  Lab 12/10/20 0930 12/13/20 0916 12/15/20 0817  NA 135 128* 127*  K 3.5 4.0 3.8  CL 99 88* 89*  CO2 28 25 27   GLUCOSE 150* 138* 169*  BUN 87* 125* 121*  CREATININE 3.17* 4.55* 4.08*  CALCIUM 9.5 8.9 8.5*  PHOS 1.8* 1.6* 1.6*    Liver Function Tests: Recent Labs  Lab 12/10/20 0930 12/13/20 0916 12/15/20 0817  ALBUMIN 1.8* 2.0* 1.8*   No results for input(s): LIPASE, AMYLASE in the last 168 hours. No results for input(s): AMMONIA in the last 168 hours.  CBC: Recent Labs  Lab 12/10/20 0930 12/13/20 1147 12/14/20 0448 12/15/20 0817  WBC 13.9* 18.7*  --  15.5*  HGB 7.3* 6.3* 7.6* 8.3*  HCT 22.8* 19.7* 23.3* 24.9*  MCV 95.4 96.1  --  93.3  PLT 480* 528*  --  441*    Cardiac Enzymes: No results for input(s): CKTOTAL, CKMB, CKMBINDEX, TROPONINI in the last 168 hours.  BNP: Invalid input(s): POCBNP  CBG: No results for input(s): GLUCAP in the last 168 hours.  Microbiology: Results for orders placed or performed during the hospital encounter of 11/21/20  Culture, blood (routine x 2)     Status: None   Collection Time: 11/25/20  3:25 PM   Specimen: BLOOD RIGHT HAND  Result Value Ref Range Status   Specimen Description BLOOD  RIGHT HAND  Final   Special Requests   Final    BOTTLES DRAWN AEROBIC ONLY Blood Culture results may not be optimal due to an inadequate volume of blood received in culture bottles   Culture   Final    NO GROWTH 5 DAYS Performed at [facility], [address], [place], [place]    Report Status 11/30/2020 FINAL  Final  Culture, blood (routine x 2)     Status: None   Collection Time: 11/25/20  3:29 PM   Specimen: BLOOD LEFT HAND  Result Value Ref Range Status   Specimen Description BLOOD LEFT HAND  Final   Special Requests   Final    BOTTLES DRAWN AEROBIC AND ANAEROBIC Blood Culture adequate volume   Culture   Final    NO GROWTH 5 DAYS Performed at [facility], [address], [place], [place]    Report Status 11/30/2020 FINAL  Final  Culture, Respiratory w Gram Stain     Status: None   Collection Time: 12/06/20 10:40 AM   Specimen: Tracheal Aspirate; Respiratory  Result Value Ref Range Status   Specimen Description TRACHEAL ASPIRATE  Final   Special Requests NONE  Final   Gram Stain   Final    ABUNDANT WBC PRESENT, PREDOMINANTLY PMN ABUNDANT GRAM NEGATIVE  RODS FEW GRAM POSITIVE COCCI FEW GRAM POSITIVE RODS    Culture   Final    MODERATE PSEUDOMONAS AERUGINOSA Two isolates with different morphologies were identified as the same organism.The most resistant organism was reported. Performed at [facility], [address], [place], [place] [address]    Report Status 12/09/2020 FINAL  Final   Organism ID, Bacteria PSEUDOMONAS AERUGINOSA  Final      Susceptibility   Pseudomonas aeruginosa - MIC*    CEFTAZIDIME 16 INTERMEDIATE Intermediate     CIPROFLOXACIN 1 SENSITIVE Sensitive     GENTAMICIN 2 SENSITIVE Sensitive     IMIPENEM >=16 RESISTANT Resistant     * MODERATE PSEUDOMONAS AERUGINOSA  Culture, blood (routine x 2)     Status: None (Preliminary result)   Collection Time: 12/13/20  9:09 AM   Specimen: BLOOD LEFT HAND  Result Value  Ref Range Status   Specimen Description BLOOD LEFT HAND  Final   Special Requests   Final    BOTTLES DRAWN AEROBIC AND ANAEROBIC Blood Culture results may not be optimal due to an inadequate volume of blood received in culture bottles   Culture   Final    NO GROWTH 2 DAYS Performed at [facility], [address] [place], [place] [address]    Report Status PENDING  Incomplete  Culture, blood (routine x 2)     Status: Abnormal (Preliminary result)   Collection Time: 12/13/20  9:13 AM   Specimen: BLOOD  Result Value Ref Range Status   Specimen Description BLOOD RIGHT ANTECUBITAL  Final   Special Requests   Final    BOTTLES DRAWN AEROBIC AND ANAEROBIC Blood Culture adequate volume   Culture  Setup Time   Final    GRAM POSITIVE COCCI IN CLUSTERS AEROBIC BOTTLE ONLY CRITICAL RESULT CALLED TO, READ BACK BY AND VERIFIED WITH: RN C.ROBENSON AT 1234 ON 12/14/2020 BY T.SAAD.    Culture (A)  Final    STAPHYLOCOCCUS EPIDERMIDIS THE SIGNIFICANCE OF ISOLATING THIS ORGANISM FROM A SINGLE SET OF BLOOD CULTURES WHEN MULTIPLE SETS ARE DRAWN IS UNCERTAIN. PLEASE NOTIFY THE MICROBIOLOGY DEPARTMENT WITHIN ONE WEEK IF SPECIATION AND SENSITIVITIES ARE REQUIRED. Performed at [facility], [address], [place], [place] [address]    Report Status PENDING  Incomplete  Blood Culture ID Panel (Reflexed)     Status: Abnormal   Collection Time: 12/13/20  9:13 AM  Result Value Ref Range Status   Enterococcus faecalis NOT DETECTED NOT DETECTED Final   Enterococcus Faecium NOT DETECTED NOT DETECTED Final   Listeria monocytogenes NOT DETECTED NOT DETECTED Final   Staphylococcus species DETECTED (A) NOT DETECTED Final    Comment: CRITICAL RESULT CALLED TO, READ BACK BY AND VERIFIED WITH: RN C.ROBERTSON AT 1234 ON 12/14/2020 BY T.SAAD.    Staphylococcus aureus (BCID) NOT DETECTED NOT DETECTED Final   Staphylococcus epidermidis DETECTED (A) NOT DETECTED Final    Comment: Methicillin (oxacillin)  resistant coagulase negative staphylococcus. Possible blood culture contaminant (unless isolated from more than one blood culture draw or clinical case suggests pathogenicity). No antibiotic treatment is indicated for blood  culture contaminants. CRITICAL RESULT CALLED TO, READ BACK BY AND VERIFIED WITH: RN C.ROBERTSON AT 1234 ON 12/14/2020 BY T.SAAD.    Staphylococcus lugdunensis NOT DETECTED NOT DETECTED Final   Streptococcus species NOT DETECTED NOT DETECTED Final   Streptococcus agalactiae NOT DETECTED NOT DETECTED Final   Streptococcus pneumoniae NOT DETECTED NOT DETECTED Final   Streptococcus pyogenes NOT DETECTED  NOT DETECTED Final   A.calcoaceticus-baumannii NOT DETECTED NOT DETECTED Final   Bacteroides fragilis NOT DETECTED NOT DETECTED Final   Enterobacterales NOT DETECTED NOT DETECTED Final   Enterobacter cloacae complex NOT DETECTED NOT DETECTED Final   Escherichia coli NOT DETECTED NOT DETECTED Final   Klebsiella aerogenes NOT DETECTED NOT DETECTED Final   Klebsiella oxytoca NOT DETECTED NOT DETECTED Final   Klebsiella pneumoniae NOT DETECTED NOT DETECTED Final   Proteus species NOT DETECTED NOT DETECTED Final   Salmonella species NOT DETECTED NOT DETECTED Final   Serratia marcescens NOT DETECTED NOT DETECTED Final   Haemophilus influenzae NOT DETECTED NOT DETECTED Final   Neisseria meningitidis NOT DETECTED NOT DETECTED Final   Pseudomonas aeruginosa NOT DETECTED NOT DETECTED Final   Stenotrophomonas maltophilia NOT DETECTED NOT DETECTED Final   Candida albicans NOT DETECTED NOT DETECTED Final   Candida auris NOT DETECTED NOT DETECTED Final   Candida glabrata NOT DETECTED NOT DETECTED Final   Candida krusei NOT DETECTED NOT DETECTED Final   Candida parapsilosis NOT DETECTED NOT DETECTED Final   Candida tropicalis NOT DETECTED NOT DETECTED Final   Cryptococcus neoformans/gattii NOT DETECTED NOT DETECTED Final   Methicillin resistance mecA/C DETECTED (A) NOT DETECTED Final     Comment: CRITICAL RESULT CALLED TO, READ BACK BY AND VERIFIED WITH: RN C.ROBERTSON AT 1234 ON 12/14/2020 BY T.SAAD. Performed at [facility], [address] [place], [place] [address]     Coagulation Studies: No results for input(s): LABPROT, INR in the last 72 hours.  Urinalysis: No results for input(s): COLORURINE, LABSPEC, PHURINE, GLUCOSEU, HGBUR, BILIRUBINUR, KETONESUR, PROTEINUR, UROBILINOGEN, NITRITE, LEUKOCYTESUR in the last 72 hours.  Invalid input(s): APPERANCEUR    Imaging: No results found.   Medications:       Assessment/ Plan:  65 y.o. male with a PMHx of ESRD on HD TTS, anemia of chronic kidney disease, hypertension, acute respiratory failure status post tracheostomy placement, seizure disorder, hyperlipidemia, diabetes mellitus type 2, history of hyperkalemia, history of coronary artery disease, peripheral vascular disease who was admitted to [facility] on 11/21/2020 for ongoing treatment of acute respiratory failure status post tracheostomy placement and ESRD.  1.  ESRD on HD.    Continue dialysis on MWF schedule using left IJ PermCath.  Staph epidermidis noted on 1 blood culture.  Management as per hospitalist.  Will be started on vancomycin, awaiting ID consult.    2.  Acute respiratory failure.  Pt off the ventilator.  Continue O2 support via T piece.   3.  Anemia of chronic kidney disease.  Hgb up to 8.3, continue retacrit, pt also received blood transfusion.   4.  Secondary hyperparathyroidism.  May need to consider changing tube feeds to a higher phosphorus content as phosphorus low at 1.6.   LOS: 0 Mariel Gaudin 6/8/20222:15 PM

## 2020-12-16 ENCOUNTER — Inpatient Hospital Stay (HOSPITAL_COMMUNITY)
Admission: AD | Admit: 2020-12-16 | Discharge: 2020-12-16 | Disposition: A | Discharge status: Home / Self Care | Payer: Medicare Other | Source: Other Acute Inpatient Hospital | Attending: Internal Medicine | Admitting: Internal Medicine

## 2020-12-16 DIAGNOSIS — R569 Unspecified convulsions: Secondary | ICD-10-CM

## 2020-12-16 LAB — CULTURE, BLOOD (ROUTINE X 2): Special Requests: ADEQUATE

## 2020-12-16 NOTE — Procedures (Signed)
Patient Name: Joel Hebert  MRN: [id number]  Epilepsy Attending: Charlsie Quest  Referring Physician/Provider:  Date: Dr Kennon Holter Duration: 30.03 mins  Patient history: 65 year old male with multiple medical problems and including anoxic brain injury who came in with worsening mental status and currently on antibiotics including cefepime.  Patient was noted to have facial twitching.  EEG to evaluate for seizures.  Level of alertness: Awake  AEDs during EEG study: Gabapentin  Technical aspects: This EEG study was done with scalp electrodes positioned according to the 10-20 International system of electrode placement. Electrical activity was acquired at a sampling rate of 500Hz  and reviewed with a high frequency filter of 70Hz  and a low frequency filter of 1Hz . EEG data were recorded continuously and digitally stored.   Description: No clear posterior dominant rhythm was seen. Patient was noted to have facial twitching throughout the EEG. Concomitant EEG showed generalized polyspikes consistent with myoclonic seizure. EEG also showed continuous generalized 3 to 5 Hz theta-delta slowing.  Hyperventilation and photic stimulation were not performed.     ABNORMALITY -Myoclonic status epilepticus, generalized -Continuous slow, generalized  IMPRESSION: During the study, patient was noted to have continuous facial twitching consistent with myoclonic status epilepticus which could be due to gabapentin toxicity, cefepime toxicity in the setting of renal dysfunction.  Additionally there was evidence of moderate diffuse encephalopathy, nonspecific etiology but could be secondary to seizures.  Patient's physician was notified  

## 2020-12-16 NOTE — Progress Notes (Signed)
EEG complete - results pending 

## 2020-12-17 ENCOUNTER — Other Ambulatory Visit (HOSPITAL_COMMUNITY): Payer: Medicare Other

## 2020-12-17 LAB — RENAL FUNCTION PANEL
Albumin: 1.8 g/dL — ABNORMAL LOW (ref 3.5–5.0)
Anion gap: 12 (ref 5–15)
BUN: 92 mg/dL — ABNORMAL HIGH (ref 8–23)
CO2: 25 mmol/L (ref 22–32)
Calcium: 8.4 mg/dL — ABNORMAL LOW (ref 8.9–10.3)
Chloride: 92 mmol/L — ABNORMAL LOW (ref 98–111)
Creatinine, Ser: 3.17 mg/dL — ABNORMAL HIGH (ref 0.61–1.24)
GFR, Estimated: 21 mL/min — ABNORMAL LOW (ref >60)
Glucose, Bld: 167 mg/dL — ABNORMAL HIGH (ref 70–99)
Phosphorus: 1 mg/dL — CL (ref 2.5–4.6)
Potassium: 3.7 mmol/L (ref 3.5–5.1)
Sodium: 129 mmol/L — ABNORMAL LOW (ref 135–145)

## 2020-12-17 LAB — CBC
HCT: 22.4 % — ABNORMAL LOW (ref 39.0–52.0)
Hemoglobin: 7.4 g/dL — ABNORMAL LOW (ref 13.0–17.0)
MCH: 30.8 pg (ref 26.0–34.0)
MCHC: 33 g/dL (ref 30.0–36.0)
MCV: 93.3 fL (ref 80.0–100.0)
Platelets: 406 10*3/uL — ABNORMAL HIGH (ref 150–400)
RBC: 2.4 MIL/uL — ABNORMAL LOW (ref 4.22–5.81)
RDW: 17.3 % — ABNORMAL HIGH (ref 11.5–15.5)
WBC: 16.6 10*3/uL — ABNORMAL HIGH (ref 4.0–10.5)
nRBC: 0 % (ref 0.0–0.2)

## 2020-12-17 LAB — VANCOMYCIN, TROUGH: Vancomycin Tr: 16 ug/mL (ref 15–20)

## 2020-12-17 NOTE — Progress Notes (Signed)
[name]  ROUNDING NOTE   Subjective:  Patient had hemodialysis treatment yesterday. Due for another dialysis session today to get him back on schedule. Had EEG yesterday as well.     Objective:  Vital signs in last 24 hours:  Temperature nine 9.6 pulse 92 respirations 16 blood pressure 144/68  Physical Exam: General:  No acute distress  Head:  Normocephalic, atraumatic. Moist oral mucosal membranes  Eyes:  Anicteric  Neck:  tracheostomy in place  Lungs:   Scattered rhonchi, normal effort  Heart:  S1S2 no rubs  Abdomen:   Soft, nontender, bowel sounds present  Extremities:  Left BKA.  1+ right lower extremity edema  Neurologic: Eyes open but not following commands  Skin:  No lesions  Access:  Left IJ PermCath    Basic Metabolic Panel: Recent Labs  Lab 12/10/20 0930 12/13/20 0916 12/15/20 0817 12/17/20 0610  NA 135 128* 127* 129*  K 3.5 4.0 3.8 3.7  CL 99 88* 89* 92*  CO2 28 25 27 25   GLUCOSE 150* 138* 169* 167*  BUN 87* 125* 121* 92*  CREATININE 3.17* 4.55* 4.08* 3.17*  CALCIUM 9.5 8.9 8.5* 8.4*  PHOS 1.8* 1.6* 1.6* 1.0*     Liver Function Tests: Recent Labs  Lab 12/10/20 0930 12/13/20 0916 12/15/20 0817 12/17/20 0610  ALBUMIN 1.8* 2.0* 1.8* 1.8*    No results for input(s): LIPASE, AMYLASE in the last 168 hours. No results for input(s): AMMONIA in the last 168 hours.  CBC: Recent Labs  Lab 12/10/20 0930 12/13/20 1147 12/14/20 0448 12/15/20 0817 12/17/20 0610  WBC 13.9* 18.7*  --  15.5* 16.6*  HGB 7.3* 6.3* 7.6* 8.3* 7.4*  HCT 22.8* 19.7* 23.3* 24.9* 22.4*  MCV 95.4 96.1  --  93.3 93.3  PLT 480* 528*  --  441* 406*     Cardiac Enzymes: No results for input(s): CKTOTAL, CKMB, CKMBINDEX, TROPONINI in the last 168 hours.  BNP: Invalid input(s): POCBNP  CBG: No results for input(s): GLUCAP in the last 168 hours.  Microbiology: Results for orders placed or performed during the hospital encounter of 11/21/20  Culture,  blood (routine x 2)     Status: None   Collection Time: 11/25/20  3:25 PM   Specimen: BLOOD RIGHT HAND  Result Value Ref Range Status   Specimen Description BLOOD RIGHT HAND  Final   Special Requests   Final    BOTTLES DRAWN AEROBIC ONLY Blood Culture results may not be optimal due to an inadequate volume of blood received in culture bottles   Culture   Final    NO GROWTH 5 DAYS Performed at [facility], [address] [place], [place] [address]    Report Status 11/30/2020 FINAL  Final  Culture, blood (routine x 2)     Status: None   Collection Time: 11/25/20  3:29 PM   Specimen: BLOOD LEFT HAND  Result Value Ref Range Status   Specimen Description BLOOD LEFT HAND  Final   Special Requests   Final    BOTTLES DRAWN AEROBIC AND ANAEROBIC Blood Culture adequate volume   Culture   Final    NO GROWTH 5 DAYS Performed at [facility], [address] [place], [place] [address]    Report Status 11/30/2020 FINAL  Final  Culture, Respiratory w Gram Stain     Status: None   Collection Time: 12/06/20 10:40 AM   Specimen: Tracheal Aspirate; Respiratory  Result Value Ref Range Status  Specimen Description TRACHEAL ASPIRATE  Final   Special Requests NONE  Final   Gram Stain   Final    ABUNDANT WBC PRESENT, PREDOMINANTLY PMN ABUNDANT GRAM NEGATIVE RODS FEW GRAM POSITIVE COCCI FEW GRAM POSITIVE RODS    Culture   Final    MODERATE PSEUDOMONAS AERUGINOSA Two isolates with different morphologies were identified as the same organism.The most resistant organism was reported. Performed at [facility], [address], [place], [place] [address]    Report Status 12/09/2020 FINAL  Final   Organism ID, Bacteria PSEUDOMONAS AERUGINOSA  Final      Susceptibility   Pseudomonas aeruginosa - MIC*    CEFTAZIDIME 16 INTERMEDIATE Intermediate     CIPROFLOXACIN 1 SENSITIVE Sensitive     GENTAMICIN 2 SENSITIVE Sensitive     IMIPENEM >=16 RESISTANT Resistant     * MODERATE  PSEUDOMONAS AERUGINOSA  Culture, blood (routine x 2)     Status: None (Preliminary result)   Collection Time: 12/13/20  9:09 AM   Specimen: BLOOD LEFT HAND  Result Value Ref Range Status   Specimen Description BLOOD LEFT HAND  Final   Special Requests   Final    BOTTLES DRAWN AEROBIC AND ANAEROBIC Blood Culture results may not be optimal due to an inadequate volume of blood received in culture bottles   Culture   Final    NO GROWTH 3 DAYS Performed at [facility], [address], [place], [place] [address]    Report Status PENDING  Incomplete  Culture, blood (routine x 2)     Status: Abnormal   Collection Time: 12/13/20  9:13 AM   Specimen: BLOOD  Result Value Ref Range Status   Specimen Description BLOOD RIGHT ANTECUBITAL  Final   Special Requests   Final    BOTTLES DRAWN AEROBIC AND ANAEROBIC Blood Culture adequate volume   Culture  Setup Time   Final    GRAM POSITIVE COCCI IN CLUSTERS AEROBIC BOTTLE ONLY CRITICAL RESULT CALLED TO, READ BACK BY AND VERIFIED WITH: RN C.ROBENSON AT 1234 ON 12/14/2020 BY T.SAAD.    Culture (A)  Final    STAPHYLOCOCCUS EPIDERMIDIS THE SIGNIFICANCE OF ISOLATING THIS ORGANISM FROM A SINGLE SET OF BLOOD CULTURES WHEN MULTIPLE SETS ARE DRAWN IS UNCERTAIN. PLEASE NOTIFY THE MICROBIOLOGY DEPARTMENT WITHIN ONE WEEK IF SPECIATION AND SENSITIVITIES ARE REQUIRED. Performed at [facility], [address], [place], [place] [address]    Report Status 12/16/2020 FINAL  Final  Blood Culture ID Panel (Reflexed)     Status: Abnormal   Collection Time: 12/13/20  9:13 AM  Result Value Ref Range Status   Enterococcus faecalis NOT DETECTED NOT DETECTED Final   Enterococcus Faecium NOT DETECTED NOT DETECTED Final   Listeria monocytogenes NOT DETECTED NOT DETECTED Final   Staphylococcus species DETECTED (A) NOT DETECTED Final    Comment: CRITICAL RESULT CALLED TO, READ BACK BY AND VERIFIED WITH: RN C.ROBERTSON AT 1234 ON 12/14/2020 BY T.SAAD.     Staphylococcus aureus (BCID) NOT DETECTED NOT DETECTED Final   Staphylococcus epidermidis DETECTED (A) NOT DETECTED Final    Comment: Methicillin (oxacillin) resistant coagulase negative staphylococcus. Possible blood culture contaminant (unless isolated from more than one blood culture draw or clinical case suggests pathogenicity). No antibiotic treatment is indicated for blood  culture contaminants. CRITICAL RESULT CALLED TO, READ BACK BY AND VERIFIED WITH: RN C.ROBERTSON AT 1234 ON 12/14/2020 BY T.SAAD.    Staphylococcus lugdunensis NOT DETECTED NOT DETECTED Final   Streptococcus  species NOT DETECTED NOT DETECTED Final   Streptococcus agalactiae NOT DETECTED NOT DETECTED Final   Streptococcus pneumoniae NOT DETECTED NOT DETECTED Final   Streptococcus pyogenes NOT DETECTED NOT DETECTED Final   A.calcoaceticus-baumannii NOT DETECTED NOT DETECTED Final   Bacteroides fragilis NOT DETECTED NOT DETECTED Final   Enterobacterales NOT DETECTED NOT DETECTED Final   Enterobacter cloacae complex NOT DETECTED NOT DETECTED Final   Escherichia coli NOT DETECTED NOT DETECTED Final   Klebsiella aerogenes NOT DETECTED NOT DETECTED Final   Klebsiella oxytoca NOT DETECTED NOT DETECTED Final   Klebsiella pneumoniae NOT DETECTED NOT DETECTED Final   Proteus species NOT DETECTED NOT DETECTED Final   Salmonella species NOT DETECTED NOT DETECTED Final   Serratia marcescens NOT DETECTED NOT DETECTED Final   Haemophilus influenzae NOT DETECTED NOT DETECTED Final   Neisseria meningitidis NOT DETECTED NOT DETECTED Final   Pseudomonas aeruginosa NOT DETECTED NOT DETECTED Final   Stenotrophomonas maltophilia NOT DETECTED NOT DETECTED Final   Candida albicans NOT DETECTED NOT DETECTED Final   Candida auris NOT DETECTED NOT DETECTED Final   Candida glabrata NOT DETECTED NOT DETECTED Final   Candida krusei NOT DETECTED NOT DETECTED Final   Candida parapsilosis NOT DETECTED NOT DETECTED Final   Candida tropicalis  NOT DETECTED NOT DETECTED Final   Cryptococcus neoformans/gattii NOT DETECTED NOT DETECTED Final   Methicillin resistance mecA/C DETECTED (A) NOT DETECTED Final    Comment: CRITICAL RESULT CALLED TO, READ BACK BY AND VERIFIED WITH: RN C.ROBERTSON AT 1234 ON 12/14/2020 BY T.SAAD. Performed at [facility], [address] [place], [place] [address]     Coagulation Studies: No results for input(s): LABPROT, INR in the last 72 hours.  Urinalysis: No results for input(s): COLORURINE, LABSPEC, PHURINE, GLUCOSEU, HGBUR, BILIRUBINUR, KETONESUR, PROTEINUR, UROBILINOGEN, NITRITE, LEUKOCYTESUR in the last 72 hours.  Invalid input(s): APPERANCEUR    Imaging: EEG adult  Result Date: 12/16/2020 Charlsie Quest, MD     12/16/2020 12:15 PM Patient Name: Joel Hebert MRN: [id number] Epilepsy Attending: Charlsie Quest Referring Physician/Provider: Date: Dr Kennon Holter Duration: 30.03 mins Patient history: 65 year old male with multiple medical problems and including anoxic brain injury who came in with worsening mental status and currently on antibiotics including cefepime.  Patient was noted to have facial twitching.  EEG to evaluate for seizures. Level of alertness: Awake AEDs during EEG study: Gabapentin Technical aspects: This EEG study was done with scalp electrodes positioned according to the 10-20 International system of electrode placement. Electrical activity was acquired at a sampling rate of 500Hz  and reviewed with a high frequency filter of 70Hz  and a low frequency filter of 1Hz . EEG data were recorded continuously and digitally stored. Description: No clear posterior dominant rhythm was seen. Patient was noted to have facial twitching throughout the EEG. Concomitant EEG showed generalized polyspikes consistent with myoclonic seizure. EEG also showed continuous generalized 3 to 5 Hz theta-delta slowing.  Hyperventilation and photic stimulation were not performed.   ABNORMALITY  -Myoclonic status epilepticus, generalized -Continuous slow, generalized IMPRESSION: During the study, patient was noted to have continuous facial twitching consistent with myoclonic status epilepticus which could be due to gabapentin toxicity, cefepime toxicity in the setting of renal dysfunction.  Additionally there was evidence of moderate diffuse encephalopathy, nonspecific etiology but could be secondary to seizures. Patient's physician was notified     Medications:       Assessment/ Plan:  65 y.o. male with a PMHx of ESRD  on HD TTS, anemia of chronic kidney disease, hypertension, acute respiratory failure status post tracheostomy placement, seizure disorder, hyperlipidemia, diabetes mellitus type 2, history of hyperkalemia, history of coronary artery disease, peripheral vascular disease who was admitted to [facility] on 11/21/2020 for ongoing treatment of acute respiratory failure status post tracheostomy placement and ESRD.   1.  ESRD on HD.    Patient due for hemodialysis treatment today to get him back on a regular schedule.  He also had dialysis yesterday.  2.  Acute respiratory failure.  Patient doing well off of ventilator.  3.  Anemia of chronic kidney disease.  Hemoglobin again down to 7.4.  Continue Retacrit.  Consider blood transfusion for hemoglobin of 7 or less.  4.  Secondary hyperparathyroidism.  Phosphorus down to 1.0.  Administer sodium phosphorus 30 mmol IV x1 today.   LOS: 0 Tahliyah Anagnos 6/10/20228:39 AM

## 2020-12-18 LAB — CBC
HCT: 24.6 % — ABNORMAL LOW (ref 39.0–52.0)
Hemoglobin: 8.2 g/dL — ABNORMAL LOW (ref 13.0–17.0)
MCH: 30.9 pg (ref 26.0–34.0)
MCHC: 33.3 g/dL (ref 30.0–36.0)
MCV: 92.8 fL (ref 80.0–100.0)
Platelets: 360 10*3/uL (ref 150–400)
RBC: 2.65 MIL/uL — ABNORMAL LOW (ref 4.22–5.81)
RDW: 18 % — ABNORMAL HIGH (ref 11.5–15.5)
WBC: 15.8 10*3/uL — ABNORMAL HIGH (ref 4.0–10.5)
nRBC: 0 % (ref 0.0–0.2)

## 2020-12-18 LAB — CULTURE, BLOOD (ROUTINE X 2): Culture: NO GROWTH

## 2020-12-20 DIAGNOSIS — N179 Acute kidney failure, unspecified: Secondary | ICD-10-CM | POA: Diagnosis not present

## 2020-12-20 DIAGNOSIS — G931 Anoxic brain damage, not elsewhere classified: Secondary | ICD-10-CM | POA: Diagnosis not present

## 2020-12-20 DIAGNOSIS — J9621 Acute and chronic respiratory failure with hypoxia: Secondary | ICD-10-CM | POA: Diagnosis not present

## 2020-12-20 DIAGNOSIS — J151 Pneumonia due to Pseudomonas: Secondary | ICD-10-CM | POA: Diagnosis not present

## 2020-12-20 LAB — RENAL FUNCTION PANEL
Albumin: 1.9 g/dL — ABNORMAL LOW (ref 3.5–5.0)
Anion gap: 13 (ref 5–15)
BUN: 139 mg/dL — ABNORMAL HIGH (ref 8–23)
CO2: 24 mmol/L (ref 22–32)
Calcium: 9.2 mg/dL (ref 8.9–10.3)
Chloride: 92 mmol/L — ABNORMAL LOW (ref 98–111)
Creatinine, Ser: 3.91 mg/dL — ABNORMAL HIGH (ref 0.61–1.24)
GFR, Estimated: 16 mL/min — ABNORMAL LOW (ref >60)
Glucose, Bld: 202 mg/dL — ABNORMAL HIGH (ref 70–99)
Phosphorus: 2.2 mg/dL — ABNORMAL LOW (ref 2.5–4.6)
Potassium: 3.6 mmol/L (ref 3.5–5.1)
Sodium: 129 mmol/L — ABNORMAL LOW (ref 135–145)

## 2020-12-20 LAB — VANCOMYCIN, TROUGH: Vancomycin Tr: 17 ug/mL (ref 15–20)

## 2020-12-20 LAB — PHENYTOIN LEVEL, TOTAL: Phenytoin Lvl: 6.5 ug/mL — ABNORMAL LOW (ref 10.0–20.0)

## 2020-12-20 NOTE — Progress Notes (Signed)
Pulmonary Critical Care Medicine [facility] [place]   PULMONARY CRITICAL CARE SERVICE  PROGRESS NOTE     Joel Hebert  OIZ:[id number]  DOB: 1955-12-12   DOA: 11/21/2020  Referring Physician: Carron Curie, MD  HPI: Joel Hebert is a 65 y.o. male seen for follow up of Acute on Chronic Respiratory Failure.  Patient currently is resting comfortably without distress at this time Off the ventilator on T collar on room air patient has cough and congestion  Medications: Reviewed on Rounds  Physical Exam:  Vitals: Temperature is 96.5 pulse 81 respiratory is 15 blood pressure is 147/66 saturations 100%  Ventilator Settings on T collar room air  General: Comfortable at this time ENT: grossly unremarkable Neck: no obvious mass Cardiovascular: no malignant arrhythmias Respiratory: No rhonchi very coarse breath sounds Abdomen: soft Skin: no rash seen on limited exam Musculoskeletal: not rigid Psychiatric:unable to assess Neurologic: no seizure no involuntary movements         Lab Data:   Basic Metabolic Panel: Recent Labs  Lab 12/13/20 0916 12/15/20 0817 12/17/20 0610 12/20/20 0729  NA 128* 127* 129* 129*  K 4.0 3.8 3.7 3.6  CL 88* 89* 92* 92*  CO2 25 27 25 24   GLUCOSE 138* 169* 167* 202*  BUN 125* 121* 92* 139*  CREATININE 4.55* 4.08* 3.17* 3.91*  CALCIUM 8.9 8.5* 8.4* 9.2  PHOS 1.6* 1.6* 1.0* 2.2*    ABG: No results for input(s): PHART, PCO2ART, PO2ART, HCO3, O2SAT in the last 168 hours.  Liver Function Tests: Recent Labs  Lab 12/13/20 0916 12/15/20 0817 12/17/20 0610 12/20/20 0729  ALBUMIN 2.0* 1.8* 1.8* 1.9*   No results for input(s): LIPASE, AMYLASE in the last 168 hours. No results for input(s): AMMONIA in the last 168 hours.  CBC: Recent Labs  Lab 12/13/20 1147 12/14/20 0448 12/15/20 0817 12/17/20 0610 12/18/20 0406  WBC 18.7*  --  15.5* 16.6* 15.8*  HGB 6.3* 7.6* 8.3* 7.4* 8.2*  HCT 19.7* 23.3* 24.9* 22.4* 24.6*   MCV 96.1  --  93.3 93.3 92.8  PLT 528*  --  441* 406* 360    Cardiac Enzymes: No results for input(s): CKTOTAL, CKMB, CKMBINDEX, TROPONINI in the last 168 hours.  BNP (last 3 results) No results for input(s): BNP in the last 8760 hours.  ProBNP (last 3 results) No results for input(s): PROBNP in the last 8760 hours.  Radiological Exams: No results found.  Assessment/Plan Active Problems:   Acute on chronic respiratory failure with hypoxia (HCC)   Anoxic brain injury (HCC)   Acute renal failure superimposed on chronic kidney disease (HCC)   Pneumonia due to Pseudomonas (HCC)   Acute on chronic respiratory failure with hypoxia patient currently is on room air acute cough and sputum production is noted.  We will continue to monitor closely. Anoxic brain injury no change we will continue to follow along. Acute renal failure on chronic kidney disease we will continue to monitor the patient's lab work closely. Pneumonia due to Pseudomonas treated we will continue to follow along closely   I have personally seen and evaluated the patient, evaluated laboratory and imaging results, formulated the assessment and plan and placed orders. The Patient requires high complexity decision making with multiple systems involvement.  Rounds were done with the Respiratory Therapy Director and Staff therapists and discussed with nursing staff also.  02/17/21, MD [facility] Pulmonary Critical Care Medicine Sleep Medicine

## 2020-12-20 NOTE — Progress Notes (Signed)
[name]  ROUNDING NOTE   Subjective:  Patient completed hemodialysis treatment today. Tolerated well. Not following commands.    Objective:  Vital signs in last 24 hours:  Temperature 96.5 pulse 81 respirations 15 blood pressure 157/66  Physical Exam: General:  No acute distress  Head:  Normocephalic, atraumatic. Moist oral mucosal membranes  Eyes:  Anicteric  Neck:  tracheostomy in place  Lungs:   Scattered rhonchi, normal effort  Heart:  S1S2 no rubs  Abdomen:   Soft, nontender, bowel sounds present  Extremities:  Left BKA.  1+ right lower extremity edema  Neurologic:  Eyes open but not following commands  Skin:  No lesions  Access:  Left IJ PermCath    Basic Metabolic Panel: Recent Labs  Lab 12/15/20 0817 12/17/20 0610 12/20/20 0729  NA 127* 129* 129*  K 3.8 3.7 3.6  CL 89* 92* 92*  CO2 27 25 24   GLUCOSE 169* 167* 202*  BUN 121* 92* 139*  CREATININE 4.08* 3.17* 3.91*  CALCIUM 8.5* 8.4* 9.2  PHOS 1.6* 1.0* 2.2*     Liver Function Tests: Recent Labs  Lab 12/15/20 0817 12/17/20 0610 12/20/20 0729  ALBUMIN 1.8* 1.8* 1.9*    No results for input(s): LIPASE, AMYLASE in the last 168 hours. No results for input(s): AMMONIA in the last 168 hours.  CBC: Recent Labs  Lab 12/14/20 0448 12/15/20 0817 12/17/20 0610 12/18/20 0406  WBC  --  15.5* 16.6* 15.8*  HGB 7.6* 8.3* 7.4* 8.2*  HCT 23.3* 24.9* 22.4* 24.6*  MCV  --  93.3 93.3 92.8  PLT  --  441* 406* 360     Cardiac Enzymes: No results for input(s): CKTOTAL, CKMB, CKMBINDEX, TROPONINI in the last 168 hours.  BNP: Invalid input(s): POCBNP  CBG: No results for input(s): GLUCAP in the last 168 hours.  Microbiology: Results for orders placed or performed during the hospital encounter of 11/21/20  Culture, blood (routine x 2)     Status: None   Collection Time: 11/25/20  3:25 PM   Specimen: BLOOD RIGHT HAND  Result Value Ref Range Status   Specimen Description BLOOD RIGHT  HAND  Final   Special Requests   Final    BOTTLES DRAWN AEROBIC ONLY Blood Culture results may not be optimal due to an inadequate volume of blood received in culture bottles   Culture   Final    NO GROWTH 5 DAYS Performed at [facility], [address] [place], [place] [address]    Report Status 11/30/2020 FINAL  Final  Culture, blood (routine x 2)     Status: None   Collection Time: 11/25/20  3:29 PM   Specimen: BLOOD LEFT HAND  Result Value Ref Range Status   Specimen Description BLOOD LEFT HAND  Final   Special Requests   Final    BOTTLES DRAWN AEROBIC AND ANAEROBIC Blood Culture adequate volume   Culture   Final    NO GROWTH 5 DAYS Performed at [facility], [address] [place], [place] [address]    Report Status 11/30/2020 FINAL  Final  Culture, Respiratory w Gram Stain     Status: None   Collection Time: 12/06/20 10:40 AM   Specimen: Tracheal Aspirate; Respiratory  Result Value Ref Range Status   Specimen Description TRACHEAL ASPIRATE  Final   Special Requests NONE  Final   Gram Stain   Final    ABUNDANT WBC PRESENT, PREDOMINANTLY PMN ABUNDANT GRAM NEGATIVE RODS FEW GRAM  POSITIVE COCCI FEW GRAM POSITIVE RODS    Culture   Final    MODERATE PSEUDOMONAS AERUGINOSA Two isolates with different morphologies were identified as the same organism.The most resistant organism was reported. Performed at [facility], [address] [place], [place] [address]    Report Status 12/09/2020 FINAL  Final   Organism ID, Bacteria PSEUDOMONAS AERUGINOSA  Final      Susceptibility   Pseudomonas aeruginosa - MIC*    CEFTAZIDIME 16 INTERMEDIATE Intermediate     CIPROFLOXACIN 1 SENSITIVE Sensitive     GENTAMICIN 2 SENSITIVE Sensitive     IMIPENEM >=16 RESISTANT Resistant     * MODERATE PSEUDOMONAS AERUGINOSA  Culture, blood (routine x 2)     Status: None   Collection Time: 12/13/20  9:09 AM   Specimen: BLOOD LEFT HAND  Result Value Ref Range Status   Specimen  Description BLOOD LEFT HAND  Final   Special Requests   Final    BOTTLES DRAWN AEROBIC AND ANAEROBIC Blood Culture results may not be optimal due to an inadequate volume of blood received in culture bottles   Culture   Final    NO GROWTH 5 DAYS Performed at [facility], [address], [place], [place] [address]    Report Status 12/18/2020 FINAL  Final  Culture, blood (routine x 2)     Status: Abnormal   Collection Time: 12/13/20  9:13 AM   Specimen: BLOOD  Result Value Ref Range Status   Specimen Description BLOOD RIGHT ANTECUBITAL  Final   Special Requests   Final    BOTTLES DRAWN AEROBIC AND ANAEROBIC Blood Culture adequate volume   Culture  Setup Time   Final    GRAM POSITIVE COCCI IN CLUSTERS AEROBIC BOTTLE ONLY CRITICAL RESULT CALLED TO, READ BACK BY AND VERIFIED WITH: RN C.ROBENSON AT 1234 ON 12/14/2020 BY T.SAAD.    Culture (A)  Final    STAPHYLOCOCCUS EPIDERMIDIS THE SIGNIFICANCE OF ISOLATING THIS ORGANISM FROM A SINGLE SET OF BLOOD CULTURES WHEN MULTIPLE SETS ARE DRAWN IS UNCERTAIN. PLEASE NOTIFY THE MICROBIOLOGY DEPARTMENT WITHIN ONE WEEK IF SPECIATION AND SENSITIVITIES ARE REQUIRED. Performed at [facility], [address], [place], [place] [address]    Report Status 12/16/2020 FINAL  Final  Blood Culture ID Panel (Reflexed)     Status: Abnormal   Collection Time: 12/13/20  9:13 AM  Result Value Ref Range Status   Enterococcus faecalis NOT DETECTED NOT DETECTED Final   Enterococcus Faecium NOT DETECTED NOT DETECTED Final   Listeria monocytogenes NOT DETECTED NOT DETECTED Final   Staphylococcus species DETECTED (A) NOT DETECTED Final    Comment: CRITICAL RESULT CALLED TO, READ BACK BY AND VERIFIED WITH: RN C.ROBERTSON AT 1234 ON 12/14/2020 BY T.SAAD.    Staphylococcus aureus (BCID) NOT DETECTED NOT DETECTED Final   Staphylococcus epidermidis DETECTED (A) NOT DETECTED Final    Comment: Methicillin (oxacillin) resistant coagulase negative staphylococcus.  Possible blood culture contaminant (unless isolated from more than one blood culture draw or clinical case suggests pathogenicity). No antibiotic treatment is indicated for blood  culture contaminants. CRITICAL RESULT CALLED TO, READ BACK BY AND VERIFIED WITH: RN C.ROBERTSON AT 1234 ON 12/14/2020 BY T.SAAD.    Staphylococcus lugdunensis NOT DETECTED NOT DETECTED Final   Streptococcus species NOT DETECTED NOT DETECTED Final   Streptococcus agalactiae NOT DETECTED NOT DETECTED Final   Streptococcus pneumoniae NOT DETECTED NOT DETECTED Final   Streptococcus pyogenes NOT DETECTED NOT DETECTED Final  A.calcoaceticus-baumannii NOT DETECTED NOT DETECTED Final   Bacteroides fragilis NOT DETECTED NOT DETECTED Final   Enterobacterales NOT DETECTED NOT DETECTED Final   Enterobacter cloacae complex NOT DETECTED NOT DETECTED Final   Escherichia coli NOT DETECTED NOT DETECTED Final   Klebsiella aerogenes NOT DETECTED NOT DETECTED Final   Klebsiella oxytoca NOT DETECTED NOT DETECTED Final   Klebsiella pneumoniae NOT DETECTED NOT DETECTED Final   Proteus species NOT DETECTED NOT DETECTED Final   Salmonella species NOT DETECTED NOT DETECTED Final   Serratia marcescens NOT DETECTED NOT DETECTED Final   Haemophilus influenzae NOT DETECTED NOT DETECTED Final   Neisseria meningitidis NOT DETECTED NOT DETECTED Final   Pseudomonas aeruginosa NOT DETECTED NOT DETECTED Final   Stenotrophomonas maltophilia NOT DETECTED NOT DETECTED Final   Candida albicans NOT DETECTED NOT DETECTED Final   Candida auris NOT DETECTED NOT DETECTED Final   Candida glabrata NOT DETECTED NOT DETECTED Final   Candida krusei NOT DETECTED NOT DETECTED Final   Candida parapsilosis NOT DETECTED NOT DETECTED Final   Candida tropicalis NOT DETECTED NOT DETECTED Final   Cryptococcus neoformans/gattii NOT DETECTED NOT DETECTED Final   Methicillin resistance mecA/C DETECTED (A) NOT DETECTED Final    Comment: CRITICAL RESULT CALLED TO, READ  BACK BY AND VERIFIED WITH: RN C.ROBERTSON AT 1234 ON 12/14/2020 BY T.SAAD. Performed at [facility], [address] [place], [place] [address]   Culture, blood (routine x 2)     Status: None (Preliminary result)   Collection Time: 12/17/20 11:31 AM   Specimen: BLOOD  Result Value Ref Range Status   Specimen Description BLOOD RIGHT ANTECUBITAL  Final   Special Requests   Final    BOTTLES DRAWN AEROBIC ONLY Blood Culture results may not be optimal due to an inadequate volume of blood received in culture bottles   Culture   Final    NO GROWTH 3 DAYS Performed at [facility], [address] [place], [place] [address]    Report Status PENDING  Incomplete  Culture, blood (routine x 2)     Status: None (Preliminary result)   Collection Time: 12/17/20 11:38 AM   Specimen: BLOOD LEFT HAND  Result Value Ref Range Status   Specimen Description BLOOD LEFT HAND  Final   Special Requests   Final    BOTTLES DRAWN AEROBIC ONLY Blood Culture adequate volume   Culture   Final    NO GROWTH 3 DAYS Performed at [facility], [address] [place], [place] [address]    Report Status PENDING  Incomplete    Coagulation Studies: No results for input(s): LABPROT, INR in the last 72 hours.  Urinalysis: No results for input(s): COLORURINE, LABSPEC, PHURINE, GLUCOSEU, HGBUR, BILIRUBINUR, KETONESUR, PROTEINUR, UROBILINOGEN, NITRITE, LEUKOCYTESUR in the last 72 hours.  Invalid input(s): APPERANCEUR    Imaging: No results found.   Medications:       Assessment/ Plan:  65 y.o. male with a PMHx of ESRD on HD TTS, anemia of chronic kidney disease, hypertension, acute respiratory failure status post tracheostomy placement, seizure disorder, hyperlipidemia, diabetes mellitus type 2, history of hyperkalemia, history of coronary artery disease, peripheral vascular disease who was admitted to [facility] on 11/21/2020 for ongoing treatment of acute respiratory failure status  post tracheostomy placement and ESRD.   1.  ESRD on HD.    Patient completed hemodialysis treatment today.  Tolerated well.  Next dialysis treatment on Wednesday.  2.  Acute respiratory failure.  Continues to  do well with T-piece in place.  3.  Anemia of chronic kidney disease.  Hemoglobin up to 8.2.  Maintain the patient on Retacrit.  4.  Secondary hyperparathyroidism.  Phosphorus up to 2.2 post repletion.   LOS: 0 Joel Hebert 6/13/20223:53 PM

## 2020-12-21 DIAGNOSIS — N179 Acute kidney failure, unspecified: Secondary | ICD-10-CM | POA: Diagnosis not present

## 2020-12-21 DIAGNOSIS — J151 Pneumonia due to Pseudomonas: Secondary | ICD-10-CM | POA: Diagnosis not present

## 2020-12-21 DIAGNOSIS — J9621 Acute and chronic respiratory failure with hypoxia: Secondary | ICD-10-CM | POA: Diagnosis not present

## 2020-12-21 DIAGNOSIS — G931 Anoxic brain damage, not elsewhere classified: Secondary | ICD-10-CM | POA: Diagnosis not present

## 2020-12-21 NOTE — Progress Notes (Signed)
Pulmonary Critical Care Medicine [facility] [place]   PULMONARY CRITICAL CARE SERVICE  PROGRESS NOTE     Luisdaniel Kenton  HQI:[id number]  DOB: Aug 19, 1955   DOA: 11/21/2020  Referring Physician: Carron Curie, MD  HPI: Mikell Kazlauskas is a 65 y.o. male being followed for ventilator/airway/oxygen weaning Acute on Chronic Respiratory Failure.  Patient currently is on T collar has been on room air  Medications: Reviewed on Rounds  Physical Exam:  Vitals: Temperature is 97.0 pulse 95 respiratory 21 blood pressure is 146/62 saturations 91%  Ventilator Settings on T collar room air  General: Comfortable at this time Neck: supple Cardiovascular: no malignant arrhythmias Respiratory: No rhonchi very coarse breath sound Skin: no rash seen on limited exam Musculoskeletal: No gross abnormality Psychiatric:unable to assess Neurologic:no involuntary movements         Lab Data:   Basic Metabolic Panel: Recent Labs  Lab 12/15/20 0817 12/17/20 0610 12/20/20 0729  NA 127* 129* 129*  K 3.8 3.7 3.6  CL 89* 92* 92*  CO2 27 25 24   GLUCOSE 169* 167* 202*  BUN 121* 92* 139*  CREATININE 4.08* 3.17* 3.91*  CALCIUM 8.5* 8.4* 9.2  PHOS 1.6* 1.0* 2.2*    ABG: No results for input(s): PHART, PCO2ART, PO2ART, HCO3, O2SAT in the last 168 hours.  Liver Function Tests: Recent Labs  Lab 12/15/20 0817 12/17/20 0610 12/20/20 0729  ALBUMIN 1.8* 1.8* 1.9*   No results for input(s): LIPASE, AMYLASE in the last 168 hours. No results for input(s): AMMONIA in the last 168 hours.  CBC: Recent Labs  Lab 12/15/20 0817 12/17/20 0610 12/18/20 0406  WBC 15.5* 16.6* 15.8*  HGB 8.3* 7.4* 8.2*  HCT 24.9* 22.4* 24.6*  MCV 93.3 93.3 92.8  PLT 441* 406* 360    Cardiac Enzymes: No results for input(s): CKTOTAL, CKMB, CKMBINDEX, TROPONINI in the last 168 hours.  BNP (last 3 results) No results for input(s): BNP in the last 8760 hours.  ProBNP (last 3 results) No  results for input(s): PROBNP in the last 8760 hours.  Radiological Exams: No results found.  Assessment/Plan Active Problems:   Acute on chronic respiratory failure with hypoxia (HCC)   Anoxic brain injury (HCC)   Acute renal failure superimposed on chronic kidney disease (HCC)   Pneumonia due to Pseudomonas (HCC)   Acute on chronic respiratory failure with hypoxia Reitnauer is on T-piece has been on room air appears to be comfortable right now without any distress plan is going to be to continue with the T-piece because of excessive secretions [place] brain injury grossly has been unchanged Acute renal failure continue to monitor the patient's lab work closely Pneumonia secondary to Pseudomonas has been treated clinically seems to be improving   I have personally seen and evaluated the patient, evaluated laboratory and imaging results, formulated the assessment and plan and placed orders. The Patient requires high complexity decision making with multiple systems involvement.  Rounds were done with the Respiratory Therapy Director and Staff therapists and discussed with nursing staff also.  02/17/21, MD [facility] Pulmonary Critical Care Medicine Sleep Medicine

## 2020-12-22 ENCOUNTER — Encounter (HOSPITAL_BASED_OUTPATIENT_CLINIC_OR_DEPARTMENT_OTHER): Payer: Medicare Other

## 2020-12-22 DIAGNOSIS — N179 Acute kidney failure, unspecified: Secondary | ICD-10-CM | POA: Diagnosis not present

## 2020-12-22 DIAGNOSIS — G931 Anoxic brain damage, not elsewhere classified: Secondary | ICD-10-CM | POA: Diagnosis not present

## 2020-12-22 DIAGNOSIS — M7989 Other specified soft tissue disorders: Secondary | ICD-10-CM

## 2020-12-22 DIAGNOSIS — J9621 Acute and chronic respiratory failure with hypoxia: Secondary | ICD-10-CM | POA: Diagnosis not present

## 2020-12-22 DIAGNOSIS — R69 Illness, unspecified: Secondary | ICD-10-CM

## 2020-12-22 DIAGNOSIS — J151 Pneumonia due to Pseudomonas: Secondary | ICD-10-CM | POA: Diagnosis not present

## 2020-12-22 LAB — HEPATITIS PANEL, ACUTE
HCV Ab: NONREACTIVE
Hep A IgM: NONREACTIVE
Hep B C IgM: NONREACTIVE
Hepatitis B Surface Ag: NONREACTIVE

## 2020-12-22 LAB — CULTURE, BLOOD (ROUTINE X 2)
Culture: NO GROWTH
Culture: NO GROWTH
Special Requests: ADEQUATE

## 2020-12-22 LAB — RENAL FUNCTION PANEL
Albumin: 1.8 g/dL — ABNORMAL LOW (ref 3.5–5.0)
Anion gap: 12 (ref 5–15)
BUN: 120 mg/dL — ABNORMAL HIGH (ref 8–23)
CO2: 25 mmol/L (ref 22–32)
Calcium: 9.4 mg/dL (ref 8.9–10.3)
Chloride: 94 mmol/L — ABNORMAL LOW (ref 98–111)
Creatinine, Ser: 3.39 mg/dL — ABNORMAL HIGH (ref 0.61–1.24)
GFR, Estimated: 19 mL/min — ABNORMAL LOW (ref >60)
Glucose, Bld: 178 mg/dL — ABNORMAL HIGH (ref 70–99)
Phosphorus: 2.4 mg/dL — ABNORMAL LOW (ref 2.5–4.6)
Potassium: 3.5 mmol/L (ref 3.5–5.1)
Sodium: 131 mmol/L — ABNORMAL LOW (ref 135–145)

## 2020-12-22 LAB — VANCOMYCIN, TROUGH: Vancomycin Tr: 31 ug/mL (ref 15–20)

## 2020-12-22 NOTE — Progress Notes (Signed)
Lower extremity venous has been completed.   Preliminary results in CV Proc.   Blanch Media 12/22/2020 1:27 PM

## 2020-12-22 NOTE — Progress Notes (Signed)
Pulmonary Critical Care Medicine [facility] [place]   PULMONARY CRITICAL CARE SERVICE  PROGRESS NOTE     Joel Hebert  UXL:[id number]  DOB: 1956/04/14   DOA: 11/21/2020  Referring Physician: Carron Curie, MD  HPI: Joel Hebert is a 65 y.o. male being followed for ventilator/airway/oxygen weaning Acute on Chronic Respiratory Failure.  Currently is on T collar patient is made DNR  Medications: Reviewed on Rounds  Physical Exam:  Vitals: Temperature is 98.4 pulse 71 respiratory 19 blood pressure is 109/71 saturations 94  Ventilator Settings on T collar room air  General: Comfortable at this time Neck: supple Cardiovascular: no malignant arrhythmias Respiratory: No rhonchi no rales are noted at this time Skin: no rash seen on limited exam Musculoskeletal: No gross abnormality Psychiatric:unable to assess Neurologic:no involuntary movements         Lab Data:   Basic Metabolic Panel: Recent Labs  Lab 12/17/20 0610 12/20/20 0729 12/22/20 0604  NA 129* 129* 131*  K 3.7 3.6 3.5  CL 92* 92* 94*  CO2 25 24 25   GLUCOSE 167* 202* 178*  BUN 92* 139* 120*  CREATININE 3.17* 3.91* 3.39*  CALCIUM 8.4* 9.2 9.4  PHOS 1.0* 2.2* 2.4*    ABG: No results for input(s): PHART, PCO2ART, PO2ART, HCO3, O2SAT in the last 168 hours.  Liver Function Tests: Recent Labs  Lab 12/17/20 0610 12/20/20 0729 12/22/20 0604  ALBUMIN 1.8* 1.9* 1.8*   No results for input(s): LIPASE, AMYLASE in the last 168 hours. No results for input(s): AMMONIA in the last 168 hours.  CBC: Recent Labs  Lab 12/17/20 0610 12/18/20 0406  WBC 16.6* 15.8*  HGB 7.4* 8.2*  HCT 22.4* 24.6*  MCV 93.3 92.8  PLT 406* 360    Cardiac Enzymes: No results for input(s): CKTOTAL, CKMB, CKMBINDEX, TROPONINI in the last 168 hours.  BNP (last 3 results) No results for input(s): BNP in the last 8760 hours.  ProBNP (last 3 results) No results for input(s): PROBNP in the last 8760  hours.  Radiological Exams: No results found.  Assessment/Plan Active Problems:   Acute on chronic respiratory failure with hypoxia (HCC)   Anoxic brain injury (HCC)   Acute renal failure superimposed on chronic kidney disease (HCC)   Pneumonia due to Pseudomonas (HCC)   Acute on chronic respiratory failure hypoxia plan is to continue with T-piece titrate oxygen as necessary patient now has noted has been made DNR Anoxic brain injury no change we will continue to follow along closely. Acute renal failure on chronic renal failure following up on labs closely Pneumonia due to Pseudomonas has been treated   I have personally seen and evaluated the patient, evaluated laboratory and imaging results, formulated the assessment and plan and placed orders. The Patient requires high complexity decision making with multiple systems involvement.  Rounds were done with the Respiratory Therapy Director and Staff therapists and discussed with nursing staff also.  02/17/21, MD [facility] Pulmonary Critical Care Medicine Sleep Medicine

## 2020-12-22 NOTE — Progress Notes (Signed)
[name]  ROUNDING NOTE   Subjective:  Patient sitting up in chair. Arousable but not following commands otherwise. Due for hemodialysis treatment today. Goals of care meeting has been conducted with the patient's brother.    Objective:  Vital signs in last 24 hours:  Temperature 98.4 pulse 91 respirations 19 blood pressure 159/71  Physical Exam: General:  No acute distress  Head:  Normocephalic, atraumatic. 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  trace right lower extremity edema  Neurologic:  Arousable, not following commands  Skin:  No acute rash  Access:  Left IJ PermCath    Basic Metabolic Panel: Recent Labs  Lab 12/17/20 0610 12/20/20 0729 12/22/20 0604  NA 129* 129* 131*  K 3.7 3.6 3.5  CL 92* 92* 94*  CO2 25 24 25   GLUCOSE 167* 202* 178*  BUN 92* 139* 120*  CREATININE 3.17* 3.91* 3.39*  CALCIUM 8.4* 9.2 9.4  PHOS 1.0* 2.2* 2.4*     Liver Function Tests: Recent Labs  Lab 12/17/20 0610 12/20/20 0729 12/22/20 0604  ALBUMIN 1.8* 1.9* 1.8*    No results for input(s): LIPASE, AMYLASE in the last 168 hours. No results for input(s): AMMONIA in the last 168 hours.  CBC: Recent Labs  Lab 12/17/20 0610 12/18/20 0406  WBC 16.6* 15.8*  HGB 7.4* 8.2*  HCT 22.4* 24.6*  MCV 93.3 92.8  PLT 406* 360     Cardiac Enzymes: No results for input(s): CKTOTAL, CKMB, CKMBINDEX, TROPONINI in the last 168 hours.  BNP: Invalid input(s): POCBNP  CBG: No results for input(s): GLUCAP in the last 168 hours.  Microbiology: Results for orders placed or performed during the hospital encounter of 11/21/20  Culture, blood (routine x 2)     Status: None   Collection Time: 11/25/20  3:25 PM   Specimen: BLOOD RIGHT HAND  Result Value Ref Range Status   Specimen  Description BLOOD RIGHT HAND  Final   Special Requests   Final    BOTTLES DRAWN AEROBIC ONLY Blood Culture results may not be optimal due to an inadequate volume of blood received in culture bottles   Culture   Final    NO GROWTH 5 DAYS Performed at [facility], [address] [place], [place] [address]    Report Status 11/30/2020 FINAL  Final  Culture, blood (routine x 2)     Status: None   Collection Time: 11/25/20  3:29 PM   Specimen: BLOOD LEFT HAND  Result Value Ref Range Status   Specimen Description BLOOD LEFT HAND  Final   Special Requests   Final    BOTTLES DRAWN AEROBIC AND ANAEROBIC Blood Culture adequate volume   Culture   Final    NO GROWTH 5 DAYS Performed at [facility], [address] [place], [place] [address]    Report Status 11/30/2020 FINAL  Final  Culture, Respiratory w Gram Stain     Status: None   Collection Time: 12/06/20 10:40 AM   Specimen: Tracheal Aspirate; Respiratory  Result Value Ref Range Status   Specimen Description TRACHEAL ASPIRATE  Final   Special Requests NONE  Final   Gram Stain   Final    ABUNDANT WBC PRESENT, PREDOMINANTLY PMN ABUNDANT GRAM NEGATIVE RODS FEW GRAM POSITIVE COCCI FEW GRAM  POSITIVE RODS    Culture   Final    MODERATE PSEUDOMONAS AERUGINOSA Two isolates with different morphologies were identified as the same organism.The most resistant organism was reported. Performed at [facility], [address] [place], [place] [address]    Report Status 12/09/2020 FINAL  Final   Organism ID, Bacteria PSEUDOMONAS AERUGINOSA  Final      Susceptibility   Pseudomonas aeruginosa - MIC*    CEFTAZIDIME 16 INTERMEDIATE Intermediate     CIPROFLOXACIN 1 SENSITIVE Sensitive     GENTAMICIN 2 SENSITIVE Sensitive     IMIPENEM >=16 RESISTANT Resistant     * MODERATE PSEUDOMONAS AERUGINOSA  Culture, blood (routine x 2)     Status: None   Collection Time: 12/13/20  9:09 AM   Specimen: BLOOD LEFT HAND  Result Value Ref  Range Status   Specimen Description BLOOD LEFT HAND  Final   Special Requests   Final    BOTTLES DRAWN AEROBIC AND ANAEROBIC Blood Culture results may not be optimal due to an inadequate volume of blood received in culture bottles   Culture   Final    NO GROWTH 5 DAYS Performed at [facility], [address], [place], [place] [address]    Report Status 12/18/2020 FINAL  Final  Culture, blood (routine x 2)     Status: Abnormal   Collection Time: 12/13/20  9:13 AM   Specimen: BLOOD  Result Value Ref Range Status   Specimen Description BLOOD RIGHT ANTECUBITAL  Final   Special Requests   Final    BOTTLES DRAWN AEROBIC AND ANAEROBIC Blood Culture adequate volume   Culture  Setup Time   Final    GRAM POSITIVE COCCI IN CLUSTERS AEROBIC BOTTLE ONLY CRITICAL RESULT CALLED TO, READ BACK BY AND VERIFIED WITH: RN C.ROBENSON AT 1234 ON 12/14/2020 BY T.SAAD.    Culture (A)  Final    STAPHYLOCOCCUS EPIDERMIDIS THE SIGNIFICANCE OF ISOLATING THIS ORGANISM FROM A SINGLE SET OF BLOOD CULTURES WHEN MULTIPLE SETS ARE DRAWN IS UNCERTAIN. PLEASE NOTIFY THE MICROBIOLOGY DEPARTMENT WITHIN ONE WEEK IF SPECIATION AND SENSITIVITIES ARE REQUIRED. Performed at [facility], [address], [place], [place] [address]    Report Status 12/16/2020 FINAL  Final  Blood Culture ID Panel (Reflexed)     Status: Abnormal   Collection Time: 12/13/20  9:13 AM  Result Value Ref Range Status   Enterococcus faecalis NOT DETECTED NOT DETECTED Final   Enterococcus Faecium NOT DETECTED NOT DETECTED Final   Listeria monocytogenes NOT DETECTED NOT DETECTED Final   Staphylococcus species DETECTED (A) NOT DETECTED Final    Comment: CRITICAL RESULT CALLED TO, READ BACK BY AND VERIFIED WITH: RN C.ROBERTSON AT 1234 ON 12/14/2020 BY T.SAAD.    Staphylococcus aureus (BCID) NOT DETECTED NOT DETECTED Final   Staphylococcus epidermidis DETECTED (A) NOT DETECTED Final    Comment: Methicillin (oxacillin) resistant coagulase  negative staphylococcus. Possible blood culture contaminant (unless isolated from more than one blood culture draw or clinical case suggests pathogenicity). No antibiotic treatment is indicated for blood  culture contaminants. CRITICAL RESULT CALLED TO, READ BACK BY AND VERIFIED WITH: RN C.ROBERTSON AT 1234 ON 12/14/2020 BY T.SAAD.    Staphylococcus lugdunensis NOT DETECTED NOT DETECTED Final   Streptococcus species NOT DETECTED NOT DETECTED Final   Streptococcus agalactiae NOT DETECTED NOT DETECTED Final   Streptococcus pneumoniae NOT DETECTED NOT DETECTED Final   Streptococcus pyogenes NOT DETECTED NOT DETECTED Final   A.calcoaceticus-baumannii NOT DETECTED NOT  DETECTED Final   Bacteroides fragilis NOT DETECTED NOT DETECTED Final   Enterobacterales NOT DETECTED NOT DETECTED Final   Enterobacter cloacae complex NOT DETECTED NOT DETECTED Final   Escherichia coli NOT DETECTED NOT DETECTED Final   Klebsiella aerogenes NOT DETECTED NOT DETECTED Final   Klebsiella oxytoca NOT DETECTED NOT DETECTED Final   Klebsiella pneumoniae NOT DETECTED NOT DETECTED Final   Proteus species NOT DETECTED NOT DETECTED Final   Salmonella species NOT DETECTED NOT DETECTED Final   Serratia marcescens NOT DETECTED NOT DETECTED Final   Haemophilus influenzae NOT DETECTED NOT DETECTED Final   Neisseria meningitidis NOT DETECTED NOT DETECTED Final   Pseudomonas aeruginosa NOT DETECTED NOT DETECTED Final   Stenotrophomonas maltophilia NOT DETECTED NOT DETECTED Final   Candida albicans NOT DETECTED NOT DETECTED Final   Candida auris NOT DETECTED NOT DETECTED Final   Candida glabrata NOT DETECTED NOT DETECTED Final   Candida krusei NOT DETECTED NOT DETECTED Final   Candida parapsilosis NOT DETECTED NOT DETECTED Final   Candida tropicalis NOT DETECTED NOT DETECTED Final   Cryptococcus neoformans/gattii NOT DETECTED NOT DETECTED Final   Methicillin resistance mecA/C DETECTED (A) NOT DETECTED Final    Comment:  CRITICAL RESULT CALLED TO, READ BACK BY AND VERIFIED WITH: RN C.ROBERTSON AT 1234 ON 12/14/2020 BY T.SAAD. Performed at [facility], [address] [place], [place] [address]   Culture, blood (routine x 2)     Status: None   Collection Time: 12/17/20 11:31 AM   Specimen: BLOOD  Result Value Ref Range Status   Specimen Description BLOOD RIGHT ANTECUBITAL  Final   Special Requests   Final    BOTTLES DRAWN AEROBIC ONLY Blood Culture results may not be optimal due to an inadequate volume of blood received in culture bottles   Culture   Final    NO GROWTH 5 DAYS Performed at [facility], [address] [place], [place] [address]    Report Status 12/22/2020 FINAL  Final  Culture, blood (routine x 2)     Status: None   Collection Time: 12/17/20 11:38 AM   Specimen: BLOOD LEFT HAND  Result Value Ref Range Status   Specimen Description BLOOD LEFT HAND  Final   Special Requests   Final    BOTTLES DRAWN AEROBIC ONLY Blood Culture adequate volume   Culture   Final    NO GROWTH 5 DAYS Performed at [facility], [address], [place], [place] [address]    Report Status 12/22/2020 FINAL  Final    Coagulation Studies: No results for input(s): LABPROT, INR in the last 72 hours.  Urinalysis: No results for input(s): COLORURINE, LABSPEC, PHURINE, GLUCOSEU, HGBUR, BILIRUBINUR, KETONESUR, PROTEINUR, UROBILINOGEN, NITRITE, LEUKOCYTESUR in the last 72 hours.  Invalid input(s): APPERANCEUR    Imaging: No results found.   Medications:       Assessment/ Plan:  65 y.o. male with a PMHx of ESRD on HD TTS, anemia of chronic kidney disease, hypertension, acute respiratory failure status post tracheostomy placement, seizure disorder, hyperlipidemia, diabetes mellitus type 2, history of hyperkalemia, history of coronary artery disease, peripheral vascular disease who was admitted to [facility] on 11/21/2020 for ongoing treatment of acute respiratory failure status post  tracheostomy placement and ESRD.   1.  ESRD on HD.    Maintain the patient on MWF dialysis schedule.  Due for dialysis treatment today.  2.  Acute respiratory failure.  Respiratory status stable with T-piece.  3.  Anemia  of chronic kidney disease.  Repeat hemoglobin today and otherwise maintain the patient on current dosage of Retacrit.  4.  Secondary hyperparathyroidism.  Phosphorus slightly improved and currently 2.4.   LOS: 0 Joel Hebert 6/15/20229:01 AM

## 2020-12-23 DIAGNOSIS — J151 Pneumonia due to Pseudomonas: Secondary | ICD-10-CM | POA: Diagnosis not present

## 2020-12-23 DIAGNOSIS — J9621 Acute and chronic respiratory failure with hypoxia: Secondary | ICD-10-CM | POA: Diagnosis not present

## 2020-12-23 DIAGNOSIS — N179 Acute kidney failure, unspecified: Secondary | ICD-10-CM | POA: Diagnosis not present

## 2020-12-23 DIAGNOSIS — G931 Anoxic brain damage, not elsewhere classified: Secondary | ICD-10-CM | POA: Diagnosis not present

## 2020-12-23 NOTE — Progress Notes (Signed)
Pulmonary Critical Care Medicine [facility] [place]   PULMONARY CRITICAL CARE SERVICE  PROGRESS NOTE     Joel Hebert  BMW:[id number]  DOB: 1956/03/19   DOA: 11/21/2020  Referring Physician: Carron Curie, MD  HPI: Joel Hebert is a 65 y.o. male being followed for ventilator/airway/oxygen weaning Acute on Chronic Respiratory Failure.  Patient currently is on T collar has been on room air appears to be comfortable without any distress  Medications: Reviewed on Rounds  Physical Exam:  Vitals: Temperature maximum was 101.2 overnight right now pulse 100 respiratory rate was 30 blood pressure is 107/50 saturations 93%  Ventilator Settings off the ventilator on T collar  General: Comfortable at this time Neck: supple Cardiovascular: no malignant arrhythmias Respiratory: No rhonchi very coarse breath sound Skin: no rash seen on limited exam Musculoskeletal: No gross abnormality Psychiatric:unable to assess Neurologic:no involuntary movements         Lab Data:   Basic Metabolic Panel: Recent Labs  Lab 12/17/20 0610 12/20/20 0729 12/22/20 0604  NA 129* 129* 131*  K 3.7 3.6 3.5  CL 92* 92* 94*  CO2 25 24 25   GLUCOSE 167* 202* 178*  BUN 92* 139* 120*  CREATININE 3.17* 3.91* 3.39*  CALCIUM 8.4* 9.2 9.4  PHOS 1.0* 2.2* 2.4*    ABG: No results for input(s): PHART, PCO2ART, PO2ART, HCO3, O2SAT in the last 168 hours.  Liver Function Tests: Recent Labs  Lab 12/17/20 0610 12/20/20 0729 12/22/20 0604  ALBUMIN 1.8* 1.9* 1.8*   No results for input(s): LIPASE, AMYLASE in the last 168 hours. No results for input(s): AMMONIA in the last 168 hours.  CBC: Recent Labs  Lab 12/17/20 0610 12/18/20 0406  WBC 16.6* 15.8*  HGB 7.4* 8.2*  HCT 22.4* 24.6*  MCV 93.3 92.8  PLT 406* 360    Cardiac Enzymes: No results for input(s): CKTOTAL, CKMB, CKMBINDEX, TROPONINI in the last 168 hours.  BNP (last 3 results) No results for input(s): BNP in  the last 8760 hours.  ProBNP (last 3 results) No results for input(s): PROBNP in the last 8760 hours.  Radiological Exams: VAS 02/17/21 LOWER EXTREMITY VENOUS (DVT)  Result Date: 12/22/2020  Lower Venous DVT Study Patient Name:  Joel Hebert  Date of Exam:   12/22/2020 Medical Rec #: 12/24/2020              Accession #:    [id number] Date of Birth: 1955/08/23               Patient Gender: M Patient Age:   65Y Exam Location:  [facility] Procedure:      VAS [facility] LOWER EXTREMITY VENOUS (DVT) Referring Phys: [place] [id number] BROWN --------------------------------------------------------------------------------  Indications: Swelling, and Edema. Other Indications: Lt BKA. Comparison Study: 08/05/17 prior Performing Technologist: 08/07/17 RVS  Examination Guidelines: A complete evaluation includes B-mode imaging, spectral Doppler, color Doppler, and power Doppler as needed of all accessible portions of each vessel. Bilateral testing is considered an integral part of a complete examination. Limited examinations for reoccurring indications may be performed as noted. The reflux portion of the exam is performed with the patient in reverse Trendelenburg.  +-----+---------------+---------+-----------+----------+--------------+ RIGHTCompressibilityPhasicitySpontaneityPropertiesThrombus Aging +-----+---------------+---------+-----------+----------+--------------+ CFV  Full           Yes      Yes                                 +-----+---------------+---------+-----------+----------+--------------+   +---------+---------------+---------+-----------+----------+-------------------+  LEFT     CompressibilityPhasicitySpontaneityPropertiesThrombus Aging      +---------+---------------+---------+-----------+----------+-------------------+ CFV      Full           Yes      Yes                                       +---------+---------------+---------+-----------+----------+-------------------+ SFJ      Full                                                             +---------+---------------+---------+-----------+----------+-------------------+ FV Prox  Full                                                             +---------+---------------+---------+-----------+----------+-------------------+ FV Mid   Full                                                             +---------+---------------+---------+-----------+----------+-------------------+ FV DistalFull                                                             +---------+---------------+---------+-----------+----------+-------------------+ PFV      Full                                                             +---------+---------------+---------+-----------+----------+-------------------+ POP      Full           Yes      Yes                                      +---------+---------------+---------+-----------+----------+-------------------+ PTV                                                   Not well visualized +---------+---------------+---------+-----------+----------+-------------------+ PERO                                                  Not well visualized +---------+---------------+---------+-----------+----------+-------------------+    Summary: RIGHT: - No evidence of common femoral vein obstruction.  LEFT: - There is no evidence of deep vein  thrombosis in the lower extremity.  - No cystic structure found in the popliteal fossa.  *See table(s) above for measurements and observations. Electronically signed by Coral Else MD on 12/22/2020 at 7:50:23 PM.    Final     Assessment/Plan Active Problems:   Acute on chronic respiratory failure with hypoxia (HCC)   Anoxic brain injury (HCC)   Acute renal failure superimposed on chronic kidney disease (HCC)   Pneumonia due to Pseudomonas  (HCC)   Acute on chronic respiratory failure hypoxia patient had a temperature overnight being worked up by primary team we will continue with supportive care on the T collar [place]nauer which patient is actually tolerating quite well Anoxic brain injury no change we will continue with supportive care prognosis is poor Acute renal failure being seen by nephrology Pneumonia due to Pseudomonas treated slow improvement   I have personally seen and evaluated the patient, evaluated laboratory and imaging results, formulated the assessment and plan and placed orders. The Patient requires high complexity decision making with multiple systems involvement.  Rounds were done with the Respiratory Therapy Director and Staff therapists and discussed with nursing staff also.  Yevonne Pax, MD [facility] Pulmonary Critical Care Medicine Sleep Medicine

## 2020-12-24 DIAGNOSIS — J9621 Acute and chronic respiratory failure with hypoxia: Secondary | ICD-10-CM | POA: Diagnosis not present

## 2020-12-24 DIAGNOSIS — G931 Anoxic brain damage, not elsewhere classified: Secondary | ICD-10-CM | POA: Diagnosis not present

## 2020-12-24 DIAGNOSIS — J151 Pneumonia due to Pseudomonas: Secondary | ICD-10-CM | POA: Diagnosis not present

## 2020-12-24 DIAGNOSIS — N179 Acute kidney failure, unspecified: Secondary | ICD-10-CM | POA: Diagnosis not present

## 2020-12-24 LAB — RENAL FUNCTION PANEL
Albumin: 1.9 g/dL — ABNORMAL LOW (ref 3.5–5.0)
Anion gap: 12 (ref 5–15)
BUN: 99 mg/dL — ABNORMAL HIGH (ref 8–23)
CO2: 26 mmol/L (ref 22–32)
Calcium: 9.1 mg/dL (ref 8.9–10.3)
Chloride: 95 mmol/L — ABNORMAL LOW (ref 98–111)
Creatinine, Ser: 3.18 mg/dL — ABNORMAL HIGH (ref 0.61–1.24)
GFR, Estimated: 21 mL/min — ABNORMAL LOW (ref >60)
Glucose, Bld: 163 mg/dL — ABNORMAL HIGH (ref 70–99)
Phosphorus: 3 mg/dL (ref 2.5–4.6)
Potassium: 3.6 mmol/L (ref 3.5–5.1)
Sodium: 133 mmol/L — ABNORMAL LOW (ref 135–145)

## 2020-12-24 LAB — IRON AND TIBC
Iron: 40 ug/dL — ABNORMAL LOW (ref 45–182)
Saturation Ratios: 18 % (ref 17.9–39.5)
TIBC: 217 ug/dL — ABNORMAL LOW (ref 250–450)
UIBC: 177 ug/dL

## 2020-12-24 LAB — FERRITIN: Ferritin: 376 ng/mL — ABNORMAL HIGH (ref 24–336)

## 2020-12-24 LAB — VANCOMYCIN, TROUGH: Vancomycin Tr: 20 ug/mL (ref 15–20)

## 2020-12-24 LAB — FOLATE: Folate: 19.8 ng/mL (ref >5.9)

## 2020-12-24 LAB — VITAMIN B12: Vitamin B-12: 681 pg/mL (ref 180–914)

## 2020-12-24 NOTE — Progress Notes (Signed)
Pulmonary Critical Care Medicine [facility] [place]   PULMONARY CRITICAL CARE SERVICE  PROGRESS NOTE     Joel Hebert  GYI:[id number]  DOB: 1956-07-08   DOA: 11/21/2020  Referring Physician: Carron Curie, MD  HPI: Joel Hebert is a 65 y.o. male being followed for ventilator/airway/oxygen weaning Acute on Chronic Respiratory Failure.  Patient is on T collar on room air right now secretions are still copious and patient has not been able to handle the secretions  Medications: Reviewed on Rounds  Physical Exam:  Vitals: Temperature is 96.9 pulse 89 respiratory rate is 18 blood pressure 129/77 saturations 98%  Ventilator Settings off the ventilator right now on T collar  General: Comfortable at this time Neck: supple Cardiovascular: no malignant arrhythmias Respiratory: Coarse breath sounds with few scattered rhonchi Skin: no rash seen on limited exam Musculoskeletal: No gross abnormality Psychiatric:unable to assess Neurologic:no involuntary movements         Lab Data:   Basic Metabolic Panel: Recent Labs  Lab 12/20/20 0729 12/22/20 0604 12/24/20 0755  NA 129* 131* 133*  K 3.6 3.5 3.6  CL 92* 94* 95*  CO2 24 25 26   GLUCOSE 202* 178* 163*  BUN 139* 120* 99*  CREATININE 3.91* 3.39* 3.18*  CALCIUM 9.2 9.4 9.1  PHOS 2.2* 2.4* 3.0    ABG: No results for input(s): PHART, PCO2ART, PO2ART, HCO3, O2SAT in the last 168 hours.  Liver Function Tests: Recent Labs  Lab 12/20/20 0729 12/22/20 0604 12/24/20 0755  ALBUMIN 1.9* 1.8* 1.9*   No results for input(s): LIPASE, AMYLASE in the last 168 hours. No results for input(s): AMMONIA in the last 168 hours.  CBC: Recent Labs  Lab 12/18/20 0406  WBC 15.8*  HGB 8.2*  HCT 24.6*  MCV 92.8  PLT 360    Cardiac Enzymes: No results for input(s): CKTOTAL, CKMB, CKMBINDEX, TROPONINI in the last 168 hours.  BNP (last 3 results) No results for input(s): BNP in the last 8760 hours.  ProBNP  (last 3 results) No results for input(s): PROBNP in the last 8760 hours.  Radiological Exams: VAS 02/17/21 LOWER EXTREMITY VENOUS (DVT)  Result Date: 12/22/2020  Lower Venous DVT Study Patient Name:  Joel Hebert  Date of Exam:   12/22/2020 Medical Rec #: 12/24/2020              Accession #:    [id number] Date of Birth: 06/30/1956               Patient Gender: M Patient Age:   65Y Exam Location:  [facility] Procedure:      VAS [facility] LOWER EXTREMITY VENOUS (DVT) Referring Phys: [place] [id number] BROWN --------------------------------------------------------------------------------  Indications: Swelling, and Edema. Other Indications: Lt BKA. Comparison Study: 08/05/17 prior Performing Technologist: 08/07/17 RVS  Examination Guidelines: A complete evaluation includes B-mode imaging, spectral Doppler, color Doppler, and power Doppler as needed of all accessible portions of each vessel. Bilateral testing is considered an integral part of a complete examination. Limited examinations for reoccurring indications may be performed as noted. The reflux portion of the exam is performed with the patient in reverse Trendelenburg.  +-----+---------------+---------+-----------+----------+--------------+ RIGHTCompressibilityPhasicitySpontaneityPropertiesThrombus Aging +-----+---------------+---------+-----------+----------+--------------+ CFV  Full           Yes      Yes                                 +-----+---------------+---------+-----------+----------+--------------+   +---------+---------------+---------+-----------+----------+-------------------+  LEFT     CompressibilityPhasicitySpontaneityPropertiesThrombus Aging      +---------+---------------+---------+-----------+----------+-------------------+ CFV      Full           Yes      Yes                                      +---------+---------------+---------+-----------+----------+-------------------+ SFJ      Full                                                              +---------+---------------+---------+-----------+----------+-------------------+ FV Prox  Full                                                             +---------+---------------+---------+-----------+----------+-------------------+ FV Mid   Full                                                             +---------+---------------+---------+-----------+----------+-------------------+ FV DistalFull                                                             +---------+---------------+---------+-----------+----------+-------------------+ PFV      Full                                                             +---------+---------------+---------+-----------+----------+-------------------+ POP      Full           Yes      Yes                                      +---------+---------------+---------+-----------+----------+-------------------+ PTV                                                   Not well visualized +---------+---------------+---------+-----------+----------+-------------------+ PERO                                                  Not well visualized +---------+---------------+---------+-----------+----------+-------------------+    Summary: RIGHT: - No evidence of common femoral vein obstruction.  LEFT: - There is no evidence of deep vein  thrombosis in the lower extremity.  - No cystic structure found in the popliteal fossa.  *See table(s) above for measurements and observations. Electronically signed by Coral Else MD on 12/22/2020 at 7:50:23 PM.    Final     Assessment/Plan Active Problems:   Acute on chronic respiratory failure with hypoxia (HCC)   Anoxic brain injury (HCC)   Acute renal failure superimposed on chronic kidney disease (HCC)   Pneumonia due to Pseudomonas (HCC)   Acute on chronic respiratory failure with hypoxia plan is going to be to continue with the T collar  trials titrate oxygen continue pulmonary toilet. Anoxic brain injury no change continue to follow along closely. Acute renal failure on chronic kidney disease we will continue with supportive care pneumonia is treated patient also has Pseudomonas pneumonia treated with antibiotics we will continue to monitor along   I have personally seen and evaluated the patient, evaluated laboratory and imaging results, formulated the assessment and plan and placed orders. The Patient requires high complexity decision making with multiple systems involvement.  Rounds were done with the Respiratory Therapy Director and Staff therapists and discussed with nursing staff also.  Yevonne Pax, MD [facility] Pulmonary Critical Care Medicine Sleep Medicine

## 2020-12-24 NOTE — Progress Notes (Signed)
[facility]  ROUNDING NOTE   Subjective:  Patient seen and evaluated at bedside. Undergoing hemodialysis. Ultrafiltration target 2 kg.    Objective:  Vital signs in last 24 hours:  Temperature 96.9 pulse 89 respirations 19 blood pressure 129/77  Physical Exam: General:  No acute distress  Head:  Normocephalic, atraumatic. 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  trace right lower extremity edema  Neurologic:  Arousable, not following commands, myoclonic twitches noted  Skin:  No acute rash  Access:  Left IJ PermCath    Basic Metabolic Panel: Recent Labs  Lab 12/20/20 0729 12/22/20 0604  NA 129* 131*  K 3.6 3.5  CL 92* 94*  CO2 24 25  GLUCOSE 202* 178*  BUN 139* 120*  CREATININE 3.91* 3.39*  CALCIUM 9.2 9.4  PHOS 2.2* 2.4*     Liver Function Tests: Recent Labs  Lab 12/20/20 0729 12/22/20 0604  ALBUMIN 1.9* 1.8*    No results for input(s): LIPASE, AMYLASE in the last 168 hours. No results for input(s): AMMONIA in the last 168 hours.  CBC: Recent Labs  Lab 12/18/20 0406  WBC 15.8*  HGB 8.2*  HCT 24.6*  MCV 92.8  PLT 360     Cardiac Enzymes: No results for input(s): CKTOTAL, CKMB, CKMBINDEX, TROPONINI in the last 168 hours.  BNP: Invalid input(s): POCBNP  CBG: No results for input(s): GLUCAP in the last 168 hours.  Microbiology: Results for orders placed or performed during the hospital encounter of 11/21/20  Culture, blood (routine x 2)     Status: None   Collection Time: 11/25/20  3:25 PM   Specimen: BLOOD RIGHT HAND  Result Value Ref Range Status   Specimen Description BLOOD RIGHT HAND  Final   Special Requests   Final    BOTTLES DRAWN AEROBIC ONLY Blood Culture results may not be optimal due to an inadequate volume of blood received in culture  bottles   Culture   Final    NO GROWTH 5 DAYS Performed at [facility], [address], [place], [place] [address]    Report Status 11/30/2020 FINAL  Final  Culture, blood (routine x 2)     Status: None   Collection Time: 11/25/20  3:29 PM   Specimen: BLOOD LEFT HAND  Result Value Ref Range Status   Specimen Description BLOOD LEFT HAND  Final   Special Requests   Final    BOTTLES DRAWN AEROBIC AND ANAEROBIC Blood Culture adequate volume   Culture   Final    NO GROWTH 5 DAYS Performed at [facility], [address], [place], [place] [address]    Report Status 11/30/2020 FINAL  Final  Culture, Respiratory w Gram Stain     Status: None   Collection Time: 12/06/20 10:40 AM   Specimen: Tracheal Aspirate; Respiratory  Result Value Ref Range Status   Specimen Description TRACHEAL ASPIRATE  Final   Special Requests NONE  Final   Gram Stain   Final    ABUNDANT WBC PRESENT, PREDOMINANTLY PMN ABUNDANT GRAM NEGATIVE RODS FEW GRAM POSITIVE COCCI FEW GRAM POSITIVE RODS    Culture   Final    MODERATE PSEUDOMONAS AERUGINOSA Two isolates with different morphologies were identified as the same organism.The most resistant organism was reported. Performed at  [facility], [address] [place], [place] [address]    Report Status 12/09/2020 FINAL  Final   Organism ID, Bacteria PSEUDOMONAS AERUGINOSA  Final      Susceptibility   Pseudomonas aeruginosa - MIC*    CEFTAZIDIME 16 INTERMEDIATE Intermediate     CIPROFLOXACIN 1 SENSITIVE Sensitive     GENTAMICIN 2 SENSITIVE Sensitive     IMIPENEM >=16 RESISTANT Resistant     * MODERATE PSEUDOMONAS AERUGINOSA  Culture, blood (routine x 2)     Status: None   Collection Time: 12/13/20  9:09 AM   Specimen: BLOOD LEFT HAND  Result Value Ref Range Status   Specimen Description BLOOD LEFT HAND  Final   Special Requests   Final    BOTTLES DRAWN AEROBIC AND ANAEROBIC Blood Culture results may not be optimal due to an inadequate  volume of blood received in culture bottles   Culture   Final    NO GROWTH 5 DAYS Performed at [facility], [address], [place], [place] [address]    Report Status 12/18/2020 FINAL  Final  Culture, blood (routine x 2)     Status: Abnormal   Collection Time: 12/13/20  9:13 AM   Specimen: BLOOD  Result Value Ref Range Status   Specimen Description BLOOD RIGHT ANTECUBITAL  Final   Special Requests   Final    BOTTLES DRAWN AEROBIC AND ANAEROBIC Blood Culture adequate volume   Culture  Setup Time   Final    GRAM POSITIVE COCCI IN CLUSTERS AEROBIC BOTTLE ONLY CRITICAL RESULT CALLED TO, READ BACK BY AND VERIFIED WITH: RN C.ROBENSON AT 1234 ON 12/14/2020 BY T.SAAD.    Culture (A)  Final    STAPHYLOCOCCUS EPIDERMIDIS THE SIGNIFICANCE OF ISOLATING THIS ORGANISM FROM A SINGLE SET OF BLOOD CULTURES WHEN MULTIPLE SETS ARE DRAWN IS UNCERTAIN. PLEASE NOTIFY THE MICROBIOLOGY DEPARTMENT WITHIN ONE WEEK IF SPECIATION AND SENSITIVITIES ARE REQUIRED. Performed at [facility], [address], [place], [place] [address]    Report Status 12/16/2020 FINAL  Final  Blood Culture ID Panel (Reflexed)     Status: Abnormal   Collection Time: 12/13/20  9:13 AM  Result Value Ref Range Status   Enterococcus faecalis NOT DETECTED NOT DETECTED Final   Enterococcus Faecium NOT DETECTED NOT DETECTED Final   Listeria monocytogenes NOT DETECTED NOT DETECTED Final   Staphylococcus species DETECTED (A) NOT DETECTED Final    Comment: CRITICAL RESULT CALLED TO, READ BACK BY AND VERIFIED WITH: RN C.ROBERTSON AT 1234 ON 12/14/2020 BY T.SAAD.    Staphylococcus aureus (BCID) NOT DETECTED NOT DETECTED Final   Staphylococcus epidermidis DETECTED (A) NOT DETECTED Final    Comment: Methicillin (oxacillin) resistant coagulase negative staphylococcus. Possible blood culture contaminant (unless isolated from more than one blood culture draw or clinical case suggests pathogenicity). No antibiotic treatment is  indicated for blood  culture contaminants. CRITICAL RESULT CALLED TO, READ BACK BY AND VERIFIED WITH: RN C.ROBERTSON AT 1234 ON 12/14/2020 BY T.SAAD.    Staphylococcus lugdunensis NOT DETECTED NOT DETECTED Final   Streptococcus species NOT DETECTED NOT DETECTED Final   Streptococcus agalactiae NOT DETECTED NOT DETECTED Final   Streptococcus pneumoniae NOT DETECTED NOT DETECTED Final   Streptococcus pyogenes NOT DETECTED NOT DETECTED Final   A.calcoaceticus-baumannii NOT DETECTED NOT DETECTED Final   Bacteroides fragilis NOT DETECTED NOT DETECTED Final   Enterobacterales NOT DETECTED NOT DETECTED Final   Enterobacter cloacae complex NOT DETECTED NOT DETECTED Final   Escherichia coli  NOT DETECTED NOT DETECTED Final   Klebsiella aerogenes NOT DETECTED NOT DETECTED Final   Klebsiella oxytoca NOT DETECTED NOT DETECTED Final   Klebsiella pneumoniae NOT DETECTED NOT DETECTED Final   Proteus species NOT DETECTED NOT DETECTED Final   Salmonella species NOT DETECTED NOT DETECTED Final   Serratia marcescens NOT DETECTED NOT DETECTED Final   Haemophilus influenzae NOT DETECTED NOT DETECTED Final   Neisseria meningitidis NOT DETECTED NOT DETECTED Final   Pseudomonas aeruginosa NOT DETECTED NOT DETECTED Final   Stenotrophomonas maltophilia NOT DETECTED NOT DETECTED Final   Candida albicans NOT DETECTED NOT DETECTED Final   Candida auris NOT DETECTED NOT DETECTED Final   Candida glabrata NOT DETECTED NOT DETECTED Final   Candida krusei NOT DETECTED NOT DETECTED Final   Candida parapsilosis NOT DETECTED NOT DETECTED Final   Candida tropicalis NOT DETECTED NOT DETECTED Final   Cryptococcus neoformans/gattii NOT DETECTED NOT DETECTED Final   Methicillin resistance mecA/C DETECTED (A) NOT DETECTED Final    Comment: CRITICAL RESULT CALLED TO, READ BACK BY AND VERIFIED WITH: RN C.ROBERTSON AT 1234 ON 12/14/2020 BY T.SAAD. Performed at [facility], [address] [place], [place] [address]    Culture, blood (routine x 2)     Status: None   Collection Time: 12/17/20 11:31 AM   Specimen: BLOOD  Result Value Ref Range Status   Specimen Description BLOOD RIGHT ANTECUBITAL  Final   Special Requests   Final    BOTTLES DRAWN AEROBIC ONLY Blood Culture results may not be optimal due to an inadequate volume of blood received in culture bottles   Culture   Final    NO GROWTH 5 DAYS Performed at [facility], [address] [place], [place] [address]    Report Status 12/22/2020 FINAL  Final  Culture, blood (routine x 2)     Status: None   Collection Time: 12/17/20 11:38 AM   Specimen: BLOOD LEFT HAND  Result Value Ref Range Status   Specimen Description BLOOD LEFT HAND  Final   Special Requests   Final    BOTTLES DRAWN AEROBIC ONLY Blood Culture adequate volume   Culture   Final    NO GROWTH 5 DAYS Performed at [facility], [address] [place], [place] [address]    Report Status 12/22/2020 FINAL  Final    Coagulation Studies: No results for input(s): LABPROT, INR in the last 72 hours.  Urinalysis: No results for input(s): COLORURINE, LABSPEC, PHURINE, GLUCOSEU, HGBUR, BILIRUBINUR, KETONESUR, PROTEINUR, UROBILINOGEN, NITRITE, LEUKOCYTESUR in the last 72 hours.  Invalid input(s): APPERANCEUR    Imaging: VAS US LOWER EXTREMITY VENOUS (DVT)  Result Date: 12/22/2020  Lower Venous DVT Study Patient Name:  Joel Hebert  Date of Exam:   12/22/2020 Medical Rec #: [id number]              Accession #:    [id number] Date of Birth: 01/28/1956               Patient Gender: M Patient Age:   65Y [facility] Procedure:      VAS US LOWER EXTREMITY VENOUS (DVT) Referring Phys: [id number] Florian BuffSTEPHANIE DELORES BROWN --------------------------------------------------------------------------------  Indications: Swelling, and Edema. Other Indications: Lt BKA. Comparison Study: 08/05/17 prior Performing Technologist: [place] PonderMegan Stricklin RVS  Examination  Guidelines: A complete evaluation includes B-mode imaging, spectral Doppler, color Doppler, and power Doppler as needed of all accessible portions of each vessel. Bilateral testing is considered  an integral part of a complete examination. Limited examinations for reoccurring indications may be performed as noted. The reflux portion of the exam is performed with the patient in reverse Trendelenburg.  +-----+---------------+---------+-----------+----------+--------------+ RIGHTCompressibilityPhasicitySpontaneityPropertiesThrombus Aging +-----+---------------+---------+-----------+----------+--------------+ CFV  Full           Yes      Yes                                 +-----+---------------+---------+-----------+----------+--------------+   +---------+---------------+---------+-----------+----------+-------------------+ LEFT     CompressibilityPhasicitySpontaneityPropertiesThrombus Aging      +---------+---------------+---------+-----------+----------+-------------------+ CFV      Full           Yes      Yes                                      +---------+---------------+---------+-----------+----------+-------------------+ SFJ      Full                                                             +---------+---------------+---------+-----------+----------+-------------------+ FV Prox  Full                                                             +---------+---------------+---------+-----------+----------+-------------------+ FV Mid   Full                                                             +---------+---------------+---------+-----------+----------+-------------------+ FV DistalFull                                                             +---------+---------------+---------+-----------+----------+-------------------+ PFV      Full                                                              +---------+---------------+---------+-----------+----------+-------------------+ POP      Full           Yes      Yes                                      +---------+---------------+---------+-----------+----------+-------------------+ PTV  Not well visualized +---------+---------------+---------+-----------+----------+-------------------+ PERO                                                  Not well visualized +---------+---------------+---------+-----------+----------+-------------------+    Summary: RIGHT: - No evidence of common femoral vein obstruction.  LEFT: - There is no evidence of deep vein thrombosis in the lower extremity.  - No cystic structure found in the popliteal fossa.  *See table(s) above for measurements and observations. Electronically signed by Coral Else MD on 12/22/2020 at 7:50:23 PM.    Final      Medications:       Assessment/ Plan:  65 y.o. male with a PMHx of ESRD on HD TTS, anemia of chronic kidney disease, hypertension, acute respiratory failure status post tracheostomy placement, seizure disorder, hyperlipidemia, diabetes mellitus type 2, history of hyperkalemia, history of coronary artery disease, peripheral vascular disease who was admitted to [facility] on 11/21/2020 for ongoing treatment of acute respiratory failure status post tracheostomy placement and ESRD.   1.  ESRD on HD.    Patient seen and evaluated during hemodialysis treatment.  Tolerating well.  Ultrafiltration target 2 kg.  2.  Acute respiratory failure.  Patient continues to breathe comfortably through the T-piece.  Does have some secretions.  3.  Anemia of chronic kidney disease.  Hemoglobin 8.2.  Continue Retacrit.  4.  Secondary hyperparathyroidism.  Awaiting repeat serum phosphorus today.   LOS: 0 Jasdeep Kepner 6/17/20227:41 AM

## 2020-12-25 DIAGNOSIS — G931 Anoxic brain damage, not elsewhere classified: Secondary | ICD-10-CM | POA: Diagnosis not present

## 2020-12-25 DIAGNOSIS — J9621 Acute and chronic respiratory failure with hypoxia: Secondary | ICD-10-CM | POA: Diagnosis not present

## 2020-12-25 DIAGNOSIS — N179 Acute kidney failure, unspecified: Secondary | ICD-10-CM | POA: Diagnosis not present

## 2020-12-25 DIAGNOSIS — J151 Pneumonia due to Pseudomonas: Secondary | ICD-10-CM | POA: Diagnosis not present

## 2020-12-25 LAB — TRANSFERRIN: Transferrin: 152 mg/dL — ABNORMAL LOW (ref 180–329)

## 2020-12-25 LAB — TSH: TSH: 2.992 u[IU]/mL (ref 0.350–4.500)

## 2020-12-25 NOTE — Progress Notes (Signed)
Pulmonary Critical Care Medicine [facility] [place]   PULMONARY CRITICAL CARE SERVICE  PROGRESS NOTE     Joel Hebert  QQP:[id number]  DOB: 1955-12-11   DOA: 11/21/2020  Referring Physician: Carron Curie, MD  HPI: Joel Hebert is a 65 y.o. male being followed for ventilator/airway/oxygen weaning Acute on Chronic Respiratory Failure.  [name]  Medications: Reviewed on Rounds  Physical Exam:  Vitals: Temperature is 98.2 pulse 87 respiratory 34 blood pressure is 142/63 saturations 95%  Ventilator Settings off the ventilator on T collar  General: Comfortable at this time Neck: supple Cardiovascular: no malignant arrhythmias Respiratory: No rhonchi very coarse breath sound Skin: no rash seen on limited exam Musculoskeletal: No gross abnormality Psychiatric:unable to assess Neurologic:no involuntary movements         Lab Data:   Basic Metabolic Panel: Recent Labs  Lab 12/20/20 0729 12/22/20 0604 12/24/20 0755  NA 129* 131* 133*  K 3.6 3.5 3.6  CL 92* 94* 95*  CO2 24 25 26   GLUCOSE 202* 178* 163*  BUN 139* 120* 99*  CREATININE 3.91* 3.39* 3.18*  CALCIUM 9.2 9.4 9.1  PHOS 2.2* 2.4* 3.0    ABG: No results for input(s): PHART, PCO2ART, PO2ART, HCO3, O2SAT in the last 168 hours.  Liver Function Tests: Recent Labs  Lab 12/20/20 0729 12/22/20 0604 12/24/20 0755  ALBUMIN 1.9* 1.8* 1.9*   No results for input(s): LIPASE, AMYLASE in the last 168 hours. No results for input(s): AMMONIA in the last 168 hours.  CBC: No results for input(s): WBC, NEUTROABS, HGB, HCT, MCV, PLT in the last 168 hours.  Cardiac Enzymes: No results for input(s): CKTOTAL, CKMB, CKMBINDEX, TROPONINI in the last 168 hours.  BNP (last 3 results) No results for input(s): BNP in the last 8760 hours.  ProBNP (last 3 results) No results for input(s): PROBNP in the last 8760 hours.  Radiological Exams: No  results found.  Assessment/Plan Active Problems:   Acute on chronic respiratory failure with hypoxia (HCC)   Anoxic brain injury (HCC)   Acute renal failure superimposed on chronic kidney disease (HCC)   Pneumonia due to Pseudomonas (HCC)   Acute on chronic respiratory failure hypoxia plan is going to be to continue with T-piece as tolerated patient secretions are limiting factor as far as being able to wean. Anoxic brain injury no change we will continue to monitor closely. Acute renal failure patient is being seen by nephrology Pneumonia due to Pseudomonas treated we will continue to follow along closely.   I have personally seen and evaluated the patient, evaluated laboratory and imaging results, formulated the assessment and plan and placed orders. The Patient requires high complexity decision making with multiple systems involvement.  Rounds were done with the Respiratory Therapy Director and Staff therapists and discussed with nursing staff also.  12/26/20, MD [facility] Pulmonary Critical Care Medicine Sleep Medicine

## 2020-12-26 DIAGNOSIS — G931 Anoxic brain damage, not elsewhere classified: Secondary | ICD-10-CM | POA: Diagnosis not present

## 2020-12-26 DIAGNOSIS — J151 Pneumonia due to Pseudomonas: Secondary | ICD-10-CM | POA: Diagnosis not present

## 2020-12-26 DIAGNOSIS — N179 Acute kidney failure, unspecified: Secondary | ICD-10-CM | POA: Diagnosis not present

## 2020-12-26 DIAGNOSIS — J9621 Acute and chronic respiratory failure with hypoxia: Secondary | ICD-10-CM | POA: Diagnosis not present

## 2020-12-26 NOTE — Progress Notes (Signed)
Pulmonary Critical Care Medicine [facility] [place]   PULMONARY CRITICAL CARE SERVICE  PROGRESS NOTE     Joel Hebert  VQM:[id number]  DOB: July 02, 1956   DOA: 11/21/2020  Referring Physician: Carron Curie, MD  HPI: Joel Hebert is a 65 y.o. male being followed for ventilator/airway/oxygen weaning Acute on Chronic Respiratory Failure.  Patient currently is on T collar has been on room air  Medications: Reviewed on Rounds  Physical Exam:  Vitals: Temperature is 99.3 pulse 90 respiratory rate is 18 blood pressure is 164/71 saturations 96%  Ventilator Settings on T collar currently room air  General: Comfortable at this time Neck: supple Cardiovascular: no malignant arrhythmias Respiratory: Scattered rhonchi expansion is equal Skin: no rash seen on limited exam Musculoskeletal: No gross abnormality Psychiatric:unable to assess Neurologic:no involuntary movements         Lab Data:   Basic Metabolic Panel: Recent Labs  Lab 12/20/20 0729 12/22/20 0604 12/24/20 0755  NA 129* 131* 133*  K 3.6 3.5 3.6  CL 92* 94* 95*  CO2 24 25 26   GLUCOSE 202* 178* 163*  BUN 139* 120* 99*  CREATININE 3.91* 3.39* 3.18*  CALCIUM 9.2 9.4 9.1  PHOS 2.2* 2.4* 3.0    ABG: No results for input(s): PHART, PCO2ART, PO2ART, HCO3, O2SAT in the last 168 hours.  Liver Function Tests: Recent Labs  Lab 12/20/20 0729 12/22/20 0604 12/24/20 0755  ALBUMIN 1.9* 1.8* 1.9*   No results for input(s): LIPASE, AMYLASE in the last 168 hours. No results for input(s): AMMONIA in the last 168 hours.  CBC: No results for input(s): WBC, NEUTROABS, HGB, HCT, MCV, PLT in the last 168 hours.  Cardiac Enzymes: No results for input(s): CKTOTAL, CKMB, CKMBINDEX, TROPONINI in the last 168 hours.  BNP (last 3 results) No results for input(s): BNP in the last 8760 hours.  ProBNP (last 3 results) No results for input(s): PROBNP in the last 8760 hours.  Radiological Exams: No  results found.  Assessment/Plan Active Problems:   Acute on chronic respiratory failure with hypoxia (HCC)   Anoxic brain injury (HCC)   Acute renal failure superimposed on chronic kidney disease (HCC)   Pneumonia due to Pseudomonas (HCC)   Acute on chronic respiratory failure hypoxia we will continue with the T-piece patient is at baseline Anoxic brain injury no change we will continue with supportive care Acute renal failure chronic kidney disease we will continue to monitor closely. Pseudomonas pneumonia treated slowly improved   I have personally seen and evaluated the patient, evaluated laboratory and imaging results, formulated the assessment and plan and placed orders. The Patient requires high complexity decision making with multiple systems involvement.  Rounds were done with the Respiratory Therapy Director and Staff therapists and discussed with nursing staff also.  12/26/20, MD [facility] Pulmonary Critical Care Medicine Sleep Medicine

## 2020-12-27 DIAGNOSIS — J151 Pneumonia due to Pseudomonas: Secondary | ICD-10-CM | POA: Diagnosis not present

## 2020-12-27 DIAGNOSIS — N179 Acute kidney failure, unspecified: Secondary | ICD-10-CM | POA: Diagnosis not present

## 2020-12-27 DIAGNOSIS — J9621 Acute and chronic respiratory failure with hypoxia: Secondary | ICD-10-CM | POA: Diagnosis not present

## 2020-12-27 DIAGNOSIS — G931 Anoxic brain damage, not elsewhere classified: Secondary | ICD-10-CM | POA: Diagnosis not present

## 2020-12-27 LAB — RENAL FUNCTION PANEL
Albumin: 2 g/dL — ABNORMAL LOW (ref 3.5–5.0)
Anion gap: 15 (ref 5–15)
BUN: 126 mg/dL — ABNORMAL HIGH (ref 8–23)
CO2: 25 mmol/L (ref 22–32)
Calcium: 10 mg/dL (ref 8.9–10.3)
Chloride: 92 mmol/L — ABNORMAL LOW (ref 98–111)
Creatinine, Ser: 3.34 mg/dL — ABNORMAL HIGH (ref 0.61–1.24)
GFR, Estimated: 20 mL/min — ABNORMAL LOW (ref >60)
Glucose, Bld: 169 mg/dL — ABNORMAL HIGH (ref 70–99)
Phosphorus: 3 mg/dL (ref 2.5–4.6)
Potassium: 4.2 mmol/L (ref 3.5–5.1)
Sodium: 132 mmol/L — ABNORMAL LOW (ref 135–145)

## 2020-12-27 LAB — CBC
HCT: 26.1 % — ABNORMAL LOW (ref 39.0–52.0)
Hemoglobin: 8.5 g/dL — ABNORMAL LOW (ref 13.0–17.0)
MCH: 30.8 pg (ref 26.0–34.0)
MCHC: 32.6 g/dL (ref 30.0–36.0)
MCV: 94.6 fL (ref 80.0–100.0)
Platelets: 401 10*3/uL — ABNORMAL HIGH (ref 150–400)
RBC: 2.76 MIL/uL — ABNORMAL LOW (ref 4.22–5.81)
RDW: 18.1 % — ABNORMAL HIGH (ref 11.5–15.5)
WBC: 13.1 10*3/uL — ABNORMAL HIGH (ref 4.0–10.5)
nRBC: 0 % (ref 0.0–0.2)

## 2020-12-27 LAB — VANCOMYCIN, TROUGH: Vancomycin Tr: 22 ug/mL (ref 15–20)

## 2020-12-27 NOTE — Progress Notes (Signed)
[facility]  ROUNDING NOTE   Subjective:  Patient seated in chair this AM. Due for hemodialysis today. Having myoclonic twitches particularly in his face.   Objective:  Vital signs in last 24 hours:  Temperature 96.9 pulse 89 respirations 19 blood pressure 161/69  Physical Exam: General:  No acute distress  Head:  Normocephalic, atraumatic. 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  trace right lower extremity edema  Neurologic: Awake, having myoclonic twitches in the face  Skin:  No acute rash  Access:  Left IJ PermCath    Basic Metabolic Panel: Recent Labs  Lab 12/22/20 0604 12/24/20 0755 12/27/20 0629  NA 131* 133* 132*  K 3.5 3.6 4.2  CL 94* 95* 92*  CO2 25 26 25   GLUCOSE 178* 163* 169*  BUN 120* 99* 126*  CREATININE 3.39* 3.18* 3.34*  CALCIUM 9.4 9.1 10.0  PHOS 2.4* 3.0 3.0     Liver Function Tests: Recent Labs  Lab 12/22/20 0604 12/24/20 0755 12/27/20 0629  ALBUMIN 1.8* 1.9* 2.0*    No results for input(s): LIPASE, AMYLASE in the last 168 hours. No results for input(s): AMMONIA in the last 168 hours.  CBC: Recent Labs  Lab 12/27/20 0629  WBC 13.1*  HGB 8.5*  HCT 26.1*  MCV 94.6  PLT 401*     Cardiac Enzymes: No results for input(s): CKTOTAL, CKMB, CKMBINDEX, TROPONINI in the last 168 hours.  BNP: Invalid input(s): POCBNP  CBG: No results for input(s): GLUCAP in the last 168 hours.  Microbiology: Results for orders placed or performed during the hospital encounter of 11/21/20  Culture, blood (routine x 2)     Status: None   Collection Time: 11/25/20  3:25 PM   Specimen: BLOOD RIGHT HAND  Result Value Ref Range Status   Specimen Description BLOOD RIGHT HAND  Final   Special Requests   Final    BOTTLES DRAWN AEROBIC ONLY Blood Culture results  may not be optimal due to an inadequate volume of blood received in culture bottles   Culture   Final    NO GROWTH 5 DAYS Performed at [facility], [address] [place], [place]    Report Status 11/30/2020 FINAL  Final  Culture, blood (routine x 2)     Status: None   Collection Time: 11/25/20  3:29 PM   Specimen: BLOOD LEFT HAND  Result Value Ref Range Status   Specimen Description BLOOD LEFT HAND  Final   Special Requests   Final    BOTTLES DRAWN AEROBIC AND ANAEROBIC Blood Culture adequate volume   Culture   Final    NO GROWTH 5 DAYS Performed at [facility], [address] [place], [place]    Report Status 11/30/2020 FINAL  Final  Culture, Respiratory w Gram Stain     Status: None   Collection Time: 12/06/20 10:40 AM   Specimen: Tracheal Aspirate; Respiratory  Result Value Ref Range Status   Specimen Description TRACHEAL ASPIRATE  Final   Special Requests NONE  Final   Gram Stain   Final    ABUNDANT WBC PRESENT, PREDOMINANTLY PMN ABUNDANT GRAM NEGATIVE RODS FEW GRAM POSITIVE COCCI FEW GRAM POSITIVE RODS    Culture   Final    MODERATE PSEUDOMONAS AERUGINOSA Two  isolates with different morphologies were identified as the same organism.The most resistant organism was reported. Performed at [facility], [address] [place], [place] [address]    Report Status 12/09/2020 FINAL  Final   Organism ID, Bacteria PSEUDOMONAS AERUGINOSA  Final      Susceptibility   Pseudomonas aeruginosa - MIC*    CEFTAZIDIME 16 INTERMEDIATE Intermediate     CIPROFLOXACIN 1 SENSITIVE Sensitive     GENTAMICIN 2 SENSITIVE Sensitive     IMIPENEM >=16 RESISTANT Resistant     * MODERATE PSEUDOMONAS AERUGINOSA  Culture, blood (routine x 2)     Status: None   Collection Time: 12/13/20  9:09 AM   Specimen: BLOOD LEFT HAND  Result Value Ref Range Status   Specimen Description BLOOD LEFT HAND  Final   Special Requests   Final    BOTTLES DRAWN AEROBIC AND  ANAEROBIC Blood Culture results may not be optimal due to an inadequate volume of blood received in culture bottles   Culture   Final    NO GROWTH 5 DAYS Performed at [facility], [address] [place], [place] [address]    Report Status 12/18/2020 FINAL  Final  Culture, blood (routine x 2)     Status: Abnormal   Collection Time: 12/13/20  9:13 AM   Specimen: BLOOD  Result Value Ref Range Status   Specimen Description BLOOD RIGHT ANTECUBITAL  Final   Special Requests   Final    BOTTLES DRAWN AEROBIC AND ANAEROBIC Blood Culture adequate volume   Culture  Setup Time   Final    GRAM POSITIVE COCCI IN CLUSTERS AEROBIC BOTTLE ONLY CRITICAL RESULT CALLED TO, READ BACK BY AND VERIFIED WITH: RN C.ROBENSON AT 1234 ON 12/14/2020 BY T.SAAD.    Culture (A)  Final    STAPHYLOCOCCUS EPIDERMIDIS THE SIGNIFICANCE OF ISOLATING THIS ORGANISM FROM A SINGLE SET OF BLOOD CULTURES WHEN MULTIPLE SETS ARE DRAWN IS UNCERTAIN. PLEASE NOTIFY THE MICROBIOLOGY DEPARTMENT WITHIN ONE WEEK IF SPECIATION AND SENSITIVITIES ARE REQUIRED. Performed at [facility], [address] [place], [place] [address]    Report Status 12/16/2020 FINAL  Final  Blood Culture ID Panel (Reflexed)     Status: Abnormal   Collection Time: 12/13/20  9:13 AM  Result Value Ref Range Status   Enterococcus faecalis NOT DETECTED NOT DETECTED Final   Enterococcus Faecium NOT DETECTED NOT DETECTED Final   Listeria monocytogenes NOT DETECTED NOT DETECTED Final   Staphylococcus species DETECTED (A) NOT DETECTED Final    Comment: CRITICAL RESULT CALLED TO, READ BACK BY AND VERIFIED WITH: RN C.ROBERTSON AT 1234 ON 12/14/2020 BY T.SAAD.    Staphylococcus aureus (BCID) NOT DETECTED NOT DETECTED Final   Staphylococcus epidermidis DETECTED (A) NOT DETECTED Final    Comment: Methicillin (oxacillin) resistant coagulase negative staphylococcus. Possible blood culture contaminant (unless isolated from more than one blood culture draw or  clinical case suggests pathogenicity). No antibiotic treatment is indicated for blood  culture contaminants. CRITICAL RESULT CALLED TO, READ BACK BY AND VERIFIED WITH: RN C.ROBERTSON AT 1234 ON 12/14/2020 BY T.SAAD.    Staphylococcus lugdunensis NOT DETECTED NOT DETECTED Final   Streptococcus species NOT DETECTED NOT DETECTED Final   Streptococcus agalactiae NOT DETECTED NOT DETECTED Final   Streptococcus pneumoniae NOT DETECTED NOT DETECTED Final   Streptococcus pyogenes NOT DETECTED NOT DETECTED Final   A.calcoaceticus-baumannii NOT DETECTED NOT DETECTED Final   Bacteroides fragilis NOT DETECTED NOT DETECTED Final   Enterobacterales NOT DETECTED  NOT DETECTED Final   Enterobacter cloacae complex NOT DETECTED NOT DETECTED Final   Escherichia coli NOT DETECTED NOT DETECTED Final   Klebsiella aerogenes NOT DETECTED NOT DETECTED Final   Klebsiella oxytoca NOT DETECTED NOT DETECTED Final   Klebsiella pneumoniae NOT DETECTED NOT DETECTED Final   Proteus species NOT DETECTED NOT DETECTED Final   Salmonella species NOT DETECTED NOT DETECTED Final   Serratia marcescens NOT DETECTED NOT DETECTED Final   Haemophilus influenzae NOT DETECTED NOT DETECTED Final   Neisseria meningitidis NOT DETECTED NOT DETECTED Final   Pseudomonas aeruginosa NOT DETECTED NOT DETECTED Final   Stenotrophomonas maltophilia NOT DETECTED NOT DETECTED Final   Candida albicans NOT DETECTED NOT DETECTED Final   Candida auris NOT DETECTED NOT DETECTED Final   Candida glabrata NOT DETECTED NOT DETECTED Final   Candida krusei NOT DETECTED NOT DETECTED Final   Candida parapsilosis NOT DETECTED NOT DETECTED Final   Candida tropicalis NOT DETECTED NOT DETECTED Final   Cryptococcus neoformans/gattii NOT DETECTED NOT DETECTED Final   Methicillin resistance mecA/C DETECTED (A) NOT DETECTED Final    Comment: CRITICAL RESULT CALLED TO, READ BACK BY AND VERIFIED WITH: RN C.ROBERTSON AT 1234 ON 12/14/2020 BY T.SAAD. Performed at  [facility], [address] [place], [place] [address]   Culture, blood (routine x 2)     Status: None   Collection Time: 12/17/20 11:31 AM   Specimen: BLOOD  Result Value Ref Range Status   Specimen Description BLOOD RIGHT ANTECUBITAL  Final   Special Requests   Final    BOTTLES DRAWN AEROBIC ONLY Blood Culture results may not be optimal due to an inadequate volume of blood received in culture bottles   Culture   Final    NO GROWTH 5 DAYS Performed at [facility], [address] [place], [place] [address]    Report Status 12/22/2020 FINAL  Final  Culture, blood (routine x 2)     Status: None   Collection Time: 12/17/20 11:38 AM   Specimen: BLOOD LEFT HAND  Result Value Ref Range Status   Specimen Description BLOOD LEFT HAND  Final   Special Requests   Final    BOTTLES DRAWN AEROBIC ONLY Blood Culture adequate volume   Culture   Final    NO GROWTH 5 DAYS Performed at [facility], [address] [place], [place] [address]    Report Status 12/22/2020 FINAL  Final    Coagulation Studies: No results for input(s): LABPROT, INR in the last 72 hours.  Urinalysis: No results for input(s): COLORURINE, LABSPEC, PHURINE, GLUCOSEU, HGBUR, BILIRUBINUR, KETONESUR, PROTEINUR, UROBILINOGEN, NITRITE, LEUKOCYTESUR in the last 72 hours.  Invalid input(s): APPERANCEUR    Imaging: No results found.   Medications:       Assessment/ Plan:  65 y.o. male with a PMHx of ESRD on HD TTS, anemia of chronic kidney disease, hypertension, acute respiratory failure status post tracheostomy placement, seizure disorder, hyperlipidemia, diabetes mellitus type 2, history of hyperkalemia, history of coronary artery disease, peripheral vascular disease who was admitted to [facility] on 11/21/2020 for ongoing treatment of acute respiratory failure status post tracheostomy placement and ESRD.   1.  ESRD on HD.    Patient will be due for hemodialysis treatment today.  We have  prepared orders.  2.  Acute respiratory failure.  Breathing comfortably through tracheostomy at the moment.  3.  Anemia of chronic kidney disease.  Hemoglobin up slightly to 8.5.  Maintain the patient  on Retacrit on dialysis days.  4.  Secondary hyperparathyroidism.  Phosphorus remains at target at 3.0.   LOS: 0 [place] Hollar 6/20/20227:45 AM

## 2020-12-27 NOTE — Progress Notes (Signed)
Pulmonary Critical Care Medicine [facility] [place]   PULMONARY CRITICAL CARE SERVICE  PROGRESS NOTE     Joel Hebert  MIW:[id number]  DOB: [birth year]   DOA: 11/21/2020  Referring Physician: Carron Curie, MD  HPI: Joel Hebert is a 65 y.o. male being followed for ventilator/airway/oxygen weaning Acute on Chronic Respiratory Failure.  Patient at this time is on T collar has been on room air appears to be comfortable without distress  Medications: Reviewed on Rounds  Physical Exam:  Vitals: Temperature is 96.9 pulse 89 respiratory rate is 19 blood pressure is 161/69 saturations 99%  Ventilator Settings patient currently is on T collar room air  General: Comfortable at this time Neck: supple Cardiovascular: no malignant arrhythmias Respiratory: No rhonchi coarse breath sounds Skin: no rash seen on limited exam Musculoskeletal: No gross abnormality Psychiatric:unable to assess Neurologic:no involuntary movements         Lab Data:   Basic Metabolic Panel: Recent Labs  Lab 12/22/20 0604 12/24/20 0755 12/27/20 0629  NA 131* 133* 132*  K 3.5 3.6 4.2  CL 94* 95* 92*  CO2 25 26 25   GLUCOSE 178* 163* 169*  BUN 120* 99* 126*  CREATININE 3.39* 3.18* 3.34*  CALCIUM 9.4 9.1 10.0  PHOS 2.4* 3.0 3.0    ABG: No results for input(s): PHART, PCO2ART, PO2ART, HCO3, O2SAT in the last 168 hours.  Liver Function Tests: Recent Labs  Lab 12/22/20 0604 12/24/20 0755 12/27/20 0629  ALBUMIN 1.8* 1.9* 2.0*   No results for input(s): LIPASE, AMYLASE in the last 168 hours. No results for input(s): AMMONIA in the last 168 hours.  CBC: Recent Labs  Lab 12/27/20 0629  WBC 13.1*  HGB 8.5*  HCT 26.1*  MCV 94.6  PLT 401*    Cardiac Enzymes: No results for input(s): CKTOTAL, CKMB, CKMBINDEX, TROPONINI in the last 168 hours.  BNP (last 3 results) No results for input(s): BNP in the last 8760 hours.  ProBNP (last 3 results) No results for  input(s): PROBNP in the last 8760 hours.  Radiological Exams: No results found.  Assessment/Plan Active Problems:   Acute on chronic respiratory failure with hypoxia (HCC)   Anoxic brain injury (HCC)   Acute renal failure superimposed on chronic kidney disease (HCC)   Pneumonia due to Pseudomonas (HCC)   Acute on chronic respiratory failure hypoxia plan is going to be to continue with the T collar.  Oxygen requirements minimal.  Because of the secretions patient is deemed at baseline Anoxic brain injury has had no change no improvement anticipated Acute renal failure being seen by nephrology for dialysis Pneumonia due to Pseudomonas supportive care prognosis guarded   I have personally seen and evaluated the patient, evaluated laboratory and imaging results, formulated the assessment and plan and placed orders. The Patient requires high complexity decision making with multiple systems involvement.  Rounds were done with the Respiratory Therapy Director and Staff therapists and discussed with nursing staff also.  12/29/20, MD [facility] Pulmonary Critical Care Medicine Sleep Medicine

## 2020-12-28 DIAGNOSIS — G931 Anoxic brain damage, not elsewhere classified: Secondary | ICD-10-CM | POA: Diagnosis not present

## 2020-12-28 DIAGNOSIS — J9621 Acute and chronic respiratory failure with hypoxia: Secondary | ICD-10-CM | POA: Diagnosis not present

## 2020-12-28 DIAGNOSIS — N179 Acute kidney failure, unspecified: Secondary | ICD-10-CM | POA: Diagnosis not present

## 2020-12-28 DIAGNOSIS — J151 Pneumonia due to Pseudomonas: Secondary | ICD-10-CM | POA: Diagnosis not present

## 2020-12-28 NOTE — Progress Notes (Signed)
Pulmonary Critical Care Medicine [facility] [place]   PULMONARY CRITICAL CARE SERVICE  PROGRESS NOTE     Yovan Leeman  LKG:[id number]  DOB: [birth year]   DOA: 11/21/2020  Referring Physician: Carron Curie, MD  HPI: Jose Corvin is a 65 y.o. male being followed for ventilator/airway/oxygen weaning Acute on Chronic Respiratory Failure.  Patient is resting comfortably has been on T collar currently is on room air patient is at baseline  Medications: Reviewed on Rounds  Physical Exam:  Vitals: Temperature is 98.1 pulse 85 respiratory 22 blood pressure is 142/81 saturations 100%  Ventilator Settings on T collar room air  General: Comfortable at this time Neck: supple Cardiovascular: no malignant arrhythmias Respiratory: No rhonchi no rales are noted at this time Skin: no rash seen on limited exam Musculoskeletal: No gross abnormality Psychiatric:unable to assess Neurologic:no involuntary movements         Lab Data:   Basic Metabolic Panel: Recent Labs  Lab 12/22/20 0604 12/24/20 0755 12/27/20 0629  NA 131* 133* 132*  K 3.5 3.6 4.2  CL 94* 95* 92*  CO2 25 26 25   GLUCOSE 178* 163* 169*  BUN 120* 99* 126*  CREATININE 3.39* 3.18* 3.34*  CALCIUM 9.4 9.1 10.0  PHOS 2.4* 3.0 3.0    ABG: No results for input(s): PHART, PCO2ART, PO2ART, HCO3, O2SAT in the last 168 hours.  Liver Function Tests: Recent Labs  Lab 12/22/20 0604 12/24/20 0755 12/27/20 0629  ALBUMIN 1.8* 1.9* 2.0*   No results for input(s): LIPASE, AMYLASE in the last 168 hours. No results for input(s): AMMONIA in the last 168 hours.  CBC: Recent Labs  Lab 12/27/20 0629  WBC 13.1*  HGB 8.5*  HCT 26.1*  MCV 94.6  PLT 401*    Cardiac Enzymes: No results for input(s): CKTOTAL, CKMB, CKMBINDEX, TROPONINI in the last 168 hours.  BNP (last 3 results) No results for input(s): BNP in the last 8760 hours.  ProBNP (last 3 results) No results for input(s): PROBNP in the  last 8760 hours.  Radiological Exams: No results found.  Assessment/Plan Active Problems:   Acute on chronic respiratory failure with hypoxia (HCC)   Anoxic brain injury (HCC)   Acute renal failure superimposed on chronic kidney disease (HCC)   Pneumonia due to Pseudomonas (HCC)   Acute on chronic respiratory failure hypoxia we will continue with T collar trials titrate oxygen continue pulmonary toilet.  Patient is at baseline Anoxic brain injury no significant neurological improvement has been seen where it is anticipated at this stage Acute renal failure supportive care nephrology is following along Pneumonia due to Pseudomonas has been treated we will continue to monitor closely   I have personally seen and evaluated the patient, evaluated laboratory and imaging results, formulated the assessment and plan and placed orders. The Patient requires high complexity decision making with multiple systems involvement.  Rounds were done with the Respiratory Therapy Director and Staff therapists and discussed with nursing staff also.  12/29/20, MD [facility] Pulmonary Critical Care Medicine Sleep Medicine

## 2020-12-29 DIAGNOSIS — G931 Anoxic brain damage, not elsewhere classified: Secondary | ICD-10-CM | POA: Diagnosis not present

## 2020-12-29 DIAGNOSIS — J151 Pneumonia due to Pseudomonas: Secondary | ICD-10-CM | POA: Diagnosis not present

## 2020-12-29 DIAGNOSIS — N179 Acute kidney failure, unspecified: Secondary | ICD-10-CM | POA: Diagnosis not present

## 2020-12-29 DIAGNOSIS — J9621 Acute and chronic respiratory failure with hypoxia: Secondary | ICD-10-CM | POA: Diagnosis not present

## 2020-12-29 LAB — CBC
HCT: 21.9 % — ABNORMAL LOW (ref 39.0–52.0)
Hemoglobin: 7.1 g/dL — ABNORMAL LOW (ref 13.0–17.0)
MCH: 30.5 pg (ref 26.0–34.0)
MCHC: 32.4 g/dL (ref 30.0–36.0)
MCV: 94 fL (ref 80.0–100.0)
Platelets: 410 10*3/uL — ABNORMAL HIGH (ref 150–400)
RBC: 2.33 MIL/uL — ABNORMAL LOW (ref 4.22–5.81)
RDW: 18.5 % — ABNORMAL HIGH (ref 11.5–15.5)
WBC: 15.8 10*3/uL — ABNORMAL HIGH (ref 4.0–10.5)
nRBC: 0 % (ref 0.0–0.2)

## 2020-12-29 LAB — RENAL FUNCTION PANEL
Albumin: 2 g/dL — ABNORMAL LOW (ref 3.5–5.0)
Anion gap: 11 (ref 5–15)
BUN: 100 mg/dL — ABNORMAL HIGH (ref 8–23)
CO2: 25 mmol/L (ref 22–32)
Calcium: 9.4 mg/dL (ref 8.9–10.3)
Chloride: 94 mmol/L — ABNORMAL LOW (ref 98–111)
Creatinine, Ser: 2.96 mg/dL — ABNORMAL HIGH (ref 0.61–1.24)
GFR, Estimated: 23 mL/min — ABNORMAL LOW (ref >60)
Glucose, Bld: 155 mg/dL — ABNORMAL HIGH (ref 70–99)
Phosphorus: 2.2 mg/dL — ABNORMAL LOW (ref 2.5–4.6)
Potassium: 3.9 mmol/L (ref 3.5–5.1)
Sodium: 130 mmol/L — ABNORMAL LOW (ref 135–145)

## 2020-12-29 NOTE — Progress Notes (Signed)
[name]  ROUNDING NOTE   Subjective:  Patient found to be febrile early this AM. Temperature 100.4. He had fever previously also. Sitting up in chair still having myoclonic twitches particularly noted to be in the face.   Objective:  Vital signs in last 24 hours:  Temperature 100.4 pulse 105 respirations 24 blood pressure 135/64  Physical Exam: General:  No acute distress  Head:  Normocephalic, atraumatic. 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  trace right lower extremity edema  Neurologic:  Awake, having myoclonic twitches in the face  Skin:  No acute rash  Access:  Left IJ PermCath    Basic Metabolic Panel: Recent Labs  Lab 12/24/20 0755 12/27/20 0629 12/29/20 0447  NA 133* 132* 130*  K 3.6 4.2 3.9  CL 95* 92* 94*  CO2 26 25 25   GLUCOSE 163* 169* 155*  BUN 99* 126* 100*  CREATININE 3.18* 3.34* 2.96*  CALCIUM 9.1 10.0 9.4  PHOS 3.0 3.0 2.2*     Liver Function Tests: Recent Labs  Lab 12/24/20 0755 12/27/20 0629 12/29/20 0447  ALBUMIN 1.9* 2.0* 2.0*    No results for input(s): LIPASE, AMYLASE in the last 168 hours. No results for input(s): AMMONIA in the last 168 hours.  CBC: Recent Labs  Lab 12/27/20 0629 12/29/20 0447  WBC 13.1* 15.8*  HGB 8.5* 7.1*  HCT 26.1* 21.9*  MCV 94.6 94.0  PLT 401* 410*     Cardiac Enzymes: No results for input(s): CKTOTAL, CKMB, CKMBINDEX, TROPONINI in the last 168 hours.  BNP: Invalid input(s): POCBNP  CBG: No results for input(s): GLUCAP in the last 168 hours.  Microbiology: Results for orders placed or performed during the hospital encounter of 11/21/20  Culture, blood (routine x 2)     Status: None   Collection Time: 11/25/20  3:25 PM   Specimen: BLOOD RIGHT HAND  Result Value Ref Range Status   Specimen  Description BLOOD RIGHT HAND  Final   Special Requests   Final    BOTTLES DRAWN AEROBIC ONLY Blood Culture results may not be optimal due to an inadequate volume of blood received in culture bottles   Culture   Final    NO GROWTH 5 DAYS Performed at [facility], [address] [place], [place] [address]    Report Status 11/30/2020 FINAL  Final  Culture, blood (routine x 2)     Status: None   Collection Time: 11/25/20  3:29 PM   Specimen: BLOOD LEFT HAND  Result Value Ref Range Status   Specimen Description BLOOD LEFT HAND  Final   Special Requests   Final    BOTTLES DRAWN AEROBIC AND ANAEROBIC Blood Culture adequate volume   Culture   Final    NO GROWTH 5 DAYS Performed at [facility], [address] [place], [place] [address]    Report Status 11/30/2020 FINAL  Final  Culture, Respiratory w Gram Stain     Status: None   Collection Time: 12/06/20 10:40 AM   Specimen: Tracheal Aspirate; Respiratory  Result Value Ref Range Status   Specimen Description TRACHEAL ASPIRATE  Final   Special Requests NONE  Final   Gram Stain   Final    ABUNDANT WBC PRESENT, PREDOMINANTLY PMN ABUNDANT GRAM NEGATIVE RODS FEW  GRAM POSITIVE COCCI FEW GRAM POSITIVE RODS    Culture   Final    MODERATE PSEUDOMONAS AERUGINOSA Two isolates with different morphologies were identified as the same organism.The most resistant organism was reported. Performed at [facility], [address], [place], [place] [address]    Report Status 12/09/2020 FINAL  Final   Organism ID, Bacteria PSEUDOMONAS AERUGINOSA  Final      Susceptibility   Pseudomonas aeruginosa - MIC*    CEFTAZIDIME 16 INTERMEDIATE Intermediate     CIPROFLOXACIN 1 SENSITIVE Sensitive     GENTAMICIN 2 SENSITIVE Sensitive     IMIPENEM >=16 RESISTANT Resistant     * MODERATE PSEUDOMONAS AERUGINOSA  Culture, blood (routine x 2)     Status: None   Collection Time: 12/13/20  9:09 AM   Specimen: BLOOD LEFT HAND  Result Value Ref  Range Status   Specimen Description BLOOD LEFT HAND  Final   Special Requests   Final    BOTTLES DRAWN AEROBIC AND ANAEROBIC Blood Culture results may not be optimal due to an inadequate volume of blood received in culture bottles   Culture   Final    NO GROWTH 5 DAYS Performed at [facility], [address], [place], [place] [address]    Report Status 12/18/2020 FINAL  Final  Culture, blood (routine x 2)     Status: Abnormal   Collection Time: 12/13/20  9:13 AM   Specimen: BLOOD  Result Value Ref Range Status   Specimen Description BLOOD RIGHT ANTECUBITAL  Final   Special Requests   Final    BOTTLES DRAWN AEROBIC AND ANAEROBIC Blood Culture adequate volume   Culture  Setup Time   Final    GRAM POSITIVE COCCI IN CLUSTERS AEROBIC BOTTLE ONLY CRITICAL RESULT CALLED TO, READ BACK BY AND VERIFIED WITH: RN C.ROBENSON AT 1234 ON 12/14/2020 BY T.SAAD.    Culture (A)  Final    STAPHYLOCOCCUS EPIDERMIDIS THE SIGNIFICANCE OF ISOLATING THIS ORGANISM FROM A SINGLE SET OF BLOOD CULTURES WHEN MULTIPLE SETS ARE DRAWN IS UNCERTAIN. PLEASE NOTIFY THE MICROBIOLOGY DEPARTMENT WITHIN ONE WEEK IF SPECIATION AND SENSITIVITIES ARE REQUIRED. Performed at [facility], [address], [place], [place] [address]    Report Status 12/16/2020 FINAL  Final  Blood Culture ID Panel (Reflexed)     Status: Abnormal   Collection Time: 12/13/20  9:13 AM  Result Value Ref Range Status   Enterococcus faecalis NOT DETECTED NOT DETECTED Final   Enterococcus Faecium NOT DETECTED NOT DETECTED Final   Listeria monocytogenes NOT DETECTED NOT DETECTED Final   Staphylococcus species DETECTED (A) NOT DETECTED Final    Comment: CRITICAL RESULT CALLED TO, READ BACK BY AND VERIFIED WITH: RN C.ROBERTSON AT 1234 ON 12/14/2020 BY T.SAAD.    Staphylococcus aureus (BCID) NOT DETECTED NOT DETECTED Final   Staphylococcus epidermidis DETECTED (A) NOT DETECTED Final    Comment: Methicillin (oxacillin) resistant coagulase  negative staphylococcus. Possible blood culture contaminant (unless isolated from more than one blood culture draw or clinical case suggests pathogenicity). No antibiotic treatment is indicated for blood  culture contaminants. CRITICAL RESULT CALLED TO, READ BACK BY AND VERIFIED WITH: RN C.ROBERTSON AT 1234 ON 12/14/2020 BY T.SAAD.    Staphylococcus lugdunensis NOT DETECTED NOT DETECTED Final   Streptococcus species NOT DETECTED NOT DETECTED Final   Streptococcus agalactiae NOT DETECTED NOT DETECTED Final   Streptococcus pneumoniae NOT DETECTED NOT DETECTED Final   Streptococcus pyogenes NOT DETECTED NOT DETECTED Final  A.calcoaceticus-baumannii NOT DETECTED NOT DETECTED Final   Bacteroides fragilis NOT DETECTED NOT DETECTED Final   Enterobacterales NOT DETECTED NOT DETECTED Final   Enterobacter cloacae complex NOT DETECTED NOT DETECTED Final   Escherichia coli NOT DETECTED NOT DETECTED Final   Klebsiella aerogenes NOT DETECTED NOT DETECTED Final   Klebsiella oxytoca NOT DETECTED NOT DETECTED Final   Klebsiella pneumoniae NOT DETECTED NOT DETECTED Final   Proteus species NOT DETECTED NOT DETECTED Final   Salmonella species NOT DETECTED NOT DETECTED Final   Serratia marcescens NOT DETECTED NOT DETECTED Final   Haemophilus influenzae NOT DETECTED NOT DETECTED Final   Neisseria meningitidis NOT DETECTED NOT DETECTED Final   Pseudomonas aeruginosa NOT DETECTED NOT DETECTED Final   Stenotrophomonas maltophilia NOT DETECTED NOT DETECTED Final   Candida albicans NOT DETECTED NOT DETECTED Final   Candida auris NOT DETECTED NOT DETECTED Final   Candida glabrata NOT DETECTED NOT DETECTED Final   Candida krusei NOT DETECTED NOT DETECTED Final   Candida parapsilosis NOT DETECTED NOT DETECTED Final   Candida tropicalis NOT DETECTED NOT DETECTED Final   Cryptococcus neoformans/gattii NOT DETECTED NOT DETECTED Final   Methicillin resistance mecA/C DETECTED (A) NOT DETECTED Final    Comment:  CRITICAL RESULT CALLED TO, READ BACK BY AND VERIFIED WITH: RN C.ROBERTSON AT 1234 ON 12/14/2020 BY T.SAAD. Performed at [facility], [address] [place], [place] [address]   Culture, blood (routine x 2)     Status: None   Collection Time: 12/17/20 11:31 AM   Specimen: BLOOD  Result Value Ref Range Status   Specimen Description BLOOD RIGHT ANTECUBITAL  Final   Special Requests   Final    BOTTLES DRAWN AEROBIC ONLY Blood Culture results may not be optimal due to an inadequate volume of blood received in culture bottles   Culture   Final    NO GROWTH 5 DAYS Performed at [facility], [address] [place], [place] [address]    Report Status 12/22/2020 FINAL  Final  Culture, blood (routine x 2)     Status: None   Collection Time: 12/17/20 11:38 AM   Specimen: BLOOD LEFT HAND  Result Value Ref Range Status   Specimen Description BLOOD LEFT HAND  Final   Special Requests   Final    BOTTLES DRAWN AEROBIC ONLY Blood Culture adequate volume   Culture   Final    NO GROWTH 5 DAYS Performed at [facility], [address], [place], [place] [address]    Report Status 12/22/2020 FINAL  Final    Coagulation Studies: No results for input(s): LABPROT, INR in the last 72 hours.  Urinalysis: No results for input(s): COLORURINE, LABSPEC, PHURINE, GLUCOSEU, HGBUR, BILIRUBINUR, KETONESUR, PROTEINUR, UROBILINOGEN, NITRITE, LEUKOCYTESUR in the last 72 hours.  Invalid input(s): APPERANCEUR    Imaging: No results found.   Medications:       Assessment/ Plan:  65 y.o. male with a PMHx of ESRD on HD TTS, anemia of chronic kidney disease, hypertension, acute respiratory failure status post tracheostomy placement, seizure disorder, hyperlipidemia, diabetes mellitus type 2, history of hyperkalemia, history of coronary artery disease, peripheral vascular disease who was admitted to [facility] on 11/21/2020 for ongoing treatment of acute respiratory failure status post  tracheostomy placement and ESRD.   1.  ESRD on HD.    We will plan to maintain patient on MWF dialysis schedule as long as family continues to desire aggressive care.  2.  Acute respiratory failure.  Patient breathing comfortably through T-piece via his tracheostomy.  3.  Anemia of chronic kidney disease.  Hemoglobin is dropped to 7.1 today.  Defer further evaluation and management to hospitalist.  Consider blood transfusion for hemoglobin of 7 or less.  Otherwise maintain the patient on Retacrit.  4.  Secondary hyperparathyroidism.  Continue to monitor bone mineral metabolism parameters.  5.  Fever.  We will plan to check blood cultures x2 sets again.  Defer antibiotic management to hospitalist.  Most recent vancomycin level was actually found to be 22.   LOS: 0 Joel Hebert 6/22/20227:35 AM

## 2020-12-29 NOTE — Progress Notes (Signed)
Pulmonary Critical Care Medicine [facility] [place]   PULMONARY CRITICAL CARE SERVICE  PROGRESS NOTE     Joel Hebert  BJY:[id number]  DOB: 1956/01/01   DOA: 11/21/2020  Referring Physician: Carron Curie, MD  HPI: Joel Hebert is a 65 y.o. male being followed for ventilator/airway/oxygen weaning Acute on Chronic Respiratory Failure.  Patient remains off ventilator on T collar good saturations are noted  Medications: Reviewed on Rounds  Physical Exam:  Vitals: Patient has temperature 100.4 pulse 105 respiratory rate is 24 blood pressure is 135/64 saturations 95%  Ventilator Settings currently is on T collar  General: Comfortable at this time Neck: supple Cardiovascular: no malignant arrhythmias Respiratory: No rhonchi no rales are noted at this time. Skin: no rash seen on limited exam Musculoskeletal: No gross abnormality Psychiatric:unable to assess Neurologic:no involuntary movements         Lab Data:   Basic Metabolic Panel: Recent Labs  Lab 12/24/20 0755 12/27/20 0629 12/29/20 0447  NA 133* 132* 130*  K 3.6 4.2 3.9  CL 95* 92* 94*  CO2 26 25 25   GLUCOSE 163* 169* 155*  BUN 99* 126* 100*  CREATININE 3.18* 3.34* 2.96*  CALCIUM 9.1 10.0 9.4  PHOS 3.0 3.0 2.2*    ABG: No results for input(s): PHART, PCO2ART, PO2ART, HCO3, O2SAT in the last 168 hours.  Liver Function Tests: Recent Labs  Lab 12/24/20 0755 12/27/20 0629 12/29/20 0447  ALBUMIN 1.9* 2.0* 2.0*   No results for input(s): LIPASE, AMYLASE in the last 168 hours. No results for input(s): AMMONIA in the last 168 hours.  CBC: Recent Labs  Lab 12/27/20 0629 12/29/20 0447  WBC 13.1* 15.8*  HGB 8.5* 7.1*  HCT 26.1* 21.9*  MCV 94.6 94.0  PLT 401* 410*    Cardiac Enzymes: No results for input(s): CKTOTAL, CKMB, CKMBINDEX, TROPONINI in the last 168 hours.  BNP (last 3 results) No results for input(s): BNP in the last 8760 hours.  ProBNP (last 3 results) No  results for input(s): PROBNP in the last 8760 hours.  Radiological Exams: No results found.  Assessment/Plan Active Problems:   Acute on chronic respiratory failure with hypoxia (HCC)   Anoxic brain injury (HCC)   Acute renal failure superimposed on chronic kidney disease (HCC)   Pneumonia due to Pseudomonas (HCC)   Acute on chronic respiratory failure with hypoxia we will continue with the T-piece titrate oxygen as tolerated continue pulmonary toilet. Anoxic brain injury grossly unchanged at baseline right now Acute renal failure on chronic kidney disease we will continue to monitor closely. Pneumonia due to Pseudomonas has been treated   I have personally seen and evaluated the patient, evaluated laboratory and imaging results, formulated the assessment and plan and placed orders. The Patient requires high complexity decision making with multiple systems involvement.  Rounds were done with the Respiratory Therapy Director and Staff therapists and discussed with nursing staff also.  12/31/20, MD [facility] Pulmonary Critical Care Medicine Sleep Medicine

## 2020-12-30 ENCOUNTER — Other Ambulatory Visit (HOSPITAL_COMMUNITY): Payer: Medicare Other

## 2020-12-30 DIAGNOSIS — J9621 Acute and chronic respiratory failure with hypoxia: Secondary | ICD-10-CM | POA: Diagnosis not present

## 2020-12-30 DIAGNOSIS — N179 Acute kidney failure, unspecified: Secondary | ICD-10-CM | POA: Diagnosis not present

## 2020-12-30 DIAGNOSIS — G931 Anoxic brain damage, not elsewhere classified: Secondary | ICD-10-CM | POA: Diagnosis not present

## 2020-12-30 DIAGNOSIS — J151 Pneumonia due to Pseudomonas: Secondary | ICD-10-CM | POA: Diagnosis not present

## 2020-12-30 NOTE — Progress Notes (Signed)
Pulmonary Critical Care Medicine [facility] [place]   PULMONARY CRITICAL CARE SERVICE  PROGRESS NOTE     Enos Muhl  IDP:[id number]  DOB: August 25, 1955   DOA: 11/21/2020  Referring Physician: Carron Curie, MD  HPI: Joel Hebert is a 65 y.o. male being followed for ventilator/airway/oxygen weaning Acute on Chronic Respiratory Failure.  Patient is on T collar right now room air secretions are moderate  Medications: Reviewed on Rounds  Physical Exam:  Vitals: Temperature was 100.5 pulse 104 respiratory 30 blood pressure 134/57 saturations 95%  Ventilator Settings T collar off the ventilator on room air  General: Comfortable at this time Neck: supple Cardiovascular: no malignant arrhythmias Respiratory: No rhonchi no rales are noted at this time Skin: no rash seen on limited exam Musculoskeletal: No gross abnormality Psychiatric:unable to assess Neurologic:no involuntary movements         Lab Data:   Basic Metabolic Panel: Recent Labs  Lab 12/24/20 0755 12/27/20 0629 12/29/20 0447  NA 133* 132* 130*  K 3.6 4.2 3.9  CL 95* 92* 94*  CO2 26 25 25   GLUCOSE 163* 169* 155*  BUN 99* 126* 100*  CREATININE 3.18* 3.34* 2.96*  CALCIUM 9.1 10.0 9.4  PHOS 3.0 3.0 2.2*    ABG: No results for input(s): PHART, PCO2ART, PO2ART, HCO3, O2SAT in the last 168 hours.  Liver Function Tests: Recent Labs  Lab 12/24/20 0755 12/27/20 0629 12/29/20 0447  ALBUMIN 1.9* 2.0* 2.0*   No results for input(s): LIPASE, AMYLASE in the last 168 hours. No results for input(s): AMMONIA in the last 168 hours.  CBC: Recent Labs  Lab 12/27/20 0629 12/29/20 0447  WBC 13.1* 15.8*  HGB 8.5* 7.1*  HCT 26.1* 21.9*  MCV 94.6 94.0  PLT 401* 410*    Cardiac Enzymes: No results for input(s): CKTOTAL, CKMB, CKMBINDEX, TROPONINI in the last 168 hours.  BNP (last 3 results) No results for input(s): BNP in the last 8760 hours.  ProBNP (last 3 results) No results  for input(s): PROBNP in the last 8760 hours.  Radiological Exams: No results found.  Assessment/Plan Active Problems:   Acute on chronic respiratory failure with hypoxia (HCC)   Anoxic brain injury (HCC)   Acute renal failure superimposed on chronic kidney disease (HCC)   Pneumonia due to Pseudomonas (HCC)   Acute on chronic respiratory failure hypoxia patient is doing fine with the T-piece we will continue with advancing weaning as tolerated. Anoxic brain injury no change continue to monitor closely. Acute renal failure on chronic renal failure supportive care Pneumonia due to Pseudomonas has been treated   I have personally seen and evaluated the patient, evaluated laboratory and imaging results, formulated the assessment and plan and placed orders. The Patient requires high complexity decision making with multiple systems involvement.  Rounds were done with the Respiratory Therapy Director and Staff therapists and discussed with nursing staff also.  12/31/20, MD [facility] Pulmonary Critical Care Medicine Sleep Medicine

## 2020-12-31 DIAGNOSIS — N179 Acute kidney failure, unspecified: Secondary | ICD-10-CM | POA: Diagnosis not present

## 2020-12-31 DIAGNOSIS — J151 Pneumonia due to Pseudomonas: Secondary | ICD-10-CM | POA: Diagnosis not present

## 2020-12-31 DIAGNOSIS — G931 Anoxic brain damage, not elsewhere classified: Secondary | ICD-10-CM | POA: Diagnosis not present

## 2020-12-31 DIAGNOSIS — J9621 Acute and chronic respiratory failure with hypoxia: Secondary | ICD-10-CM | POA: Diagnosis not present

## 2020-12-31 LAB — RENAL FUNCTION PANEL
Albumin: 2.2 g/dL — ABNORMAL LOW (ref 3.5–5.0)
Anion gap: 9 (ref 5–15)
BUN: 31 mg/dL — ABNORMAL HIGH (ref 8–23)
CO2: 26 mmol/L (ref 22–32)
Calcium: 9 mg/dL (ref 8.9–10.3)
Chloride: 96 mmol/L — ABNORMAL LOW (ref 98–111)
Creatinine, Ser: 1.69 mg/dL — ABNORMAL HIGH (ref 0.61–1.24)
GFR, Estimated: 44 mL/min — ABNORMAL LOW (ref >60)
Glucose, Bld: 176 mg/dL — ABNORMAL HIGH (ref 70–99)
Phosphorus: 1.3 mg/dL — ABNORMAL LOW (ref 2.5–4.6)
Potassium: 3.2 mmol/L — ABNORMAL LOW (ref 3.5–5.1)
Sodium: 131 mmol/L — ABNORMAL LOW (ref 135–145)

## 2020-12-31 LAB — CBC
HCT: 24.7 % — ABNORMAL LOW (ref 39.0–52.0)
Hemoglobin: 7.8 g/dL — ABNORMAL LOW (ref 13.0–17.0)
MCH: 30.7 pg (ref 26.0–34.0)
MCHC: 31.6 g/dL (ref 30.0–36.0)
MCV: 97.2 fL (ref 80.0–100.0)
Platelets: 330 10*3/uL (ref 150–400)
RBC: 2.54 MIL/uL — ABNORMAL LOW (ref 4.22–5.81)
RDW: 18.6 % — ABNORMAL HIGH (ref 11.5–15.5)
WBC: 17.6 10*3/uL — ABNORMAL HIGH (ref 4.0–10.5)
nRBC: 0 % (ref 0.0–0.2)

## 2020-12-31 LAB — VANCOMYCIN, TROUGH: Vancomycin Tr: 19 ug/mL (ref 15–20)

## 2020-12-31 NOTE — Progress Notes (Signed)
Pulmonary Critical Care Medicine [facility] [place]   PULMONARY CRITICAL CARE SERVICE  PROGRESS NOTE     Joel Hebert  RCV:[id number]  DOB: 1955-09-18   DOA: 11/21/2020  Referring Physician: Carron Curie, MD  HPI: Joel Hebert is a 65 y.o. male being followed for ventilator/airway/oxygen weaning Acute on Chronic Respiratory Failure.  Patient is resting comfortably on T collar on room air.  Basically at baseline right now  Medications: Reviewed on Rounds  Physical Exam:  Vitals: Temperature is 99.8 pulse 97 respiratory 23 blood pressure is 120/59 saturations 96%  Ventilator Settings off the ventilator on T collar room air  General: Comfortable at this time Neck: supple Cardiovascular: no malignant arrhythmias Respiratory: Scattered rhonchi expansion is equal Skin: no rash seen on limited exam Musculoskeletal: No gross abnormality Psychiatric:unable to assess Neurologic:no involuntary movements         Lab Data:   Basic Metabolic Panel: Recent Labs  Lab 12/27/20 0629 12/29/20 0447 12/31/20 0641  NA 132* 130* 131*  K 4.2 3.9 3.2*  CL 92* 94* 96*  CO2 25 25 26   GLUCOSE 169* 155* 176*  BUN 126* 100* 31*  CREATININE 3.34* 2.96* 1.69*  CALCIUM 10.0 9.4 9.0  PHOS 3.0 2.2* 1.3*    ABG: No results for input(s): PHART, PCO2ART, PO2ART, HCO3, O2SAT in the last 168 hours.  Liver Function Tests: Recent Labs  Lab 12/27/20 0629 12/29/20 0447 12/31/20 0641  ALBUMIN 2.0* 2.0* 2.2*   No results for input(s): LIPASE, AMYLASE in the last 168 hours. No results for input(s): AMMONIA in the last 168 hours.  CBC: Recent Labs  Lab 12/27/20 0629 12/29/20 0447 12/31/20 0641  WBC 13.1* 15.8* 17.6*  HGB 8.5* 7.1* 7.8*  HCT 26.1* 21.9* 24.7*  MCV 94.6 94.0 97.2  PLT 401* 410* 330    Cardiac Enzymes: No results for input(s): CKTOTAL, CKMB, CKMBINDEX, TROPONINI in the last 168 hours.  BNP (last 3 results) No results for input(s): BNP in  the last 8760 hours.  ProBNP (last 3 results) No results for input(s): PROBNP in the last 8760 hours.  Radiological Exams: DG CHEST PORT 1 VIEW  Result Date: 12/30/2020 CLINICAL DATA:  Fever. EXAM: PORTABLE CHEST 1 VIEW COMPARISON:  12/17/2020 FINDINGS: Tracheostomy tube and left IJ central line again noted. The right IJ central line remains in place with tip overlying the SVC/RA junction. Cardiopericardial silhouette is at upper limits of normal for size. There is pulmonary vascular congestion without overt pulmonary edema. No focal consolidation or overt pulmonary edema. IMPRESSION: Pulmonary vascular congestion without overt pulmonary edema. Electronically Signed   By: 02/16/2021 M.D.   On: 12/30/2020 15:27    Assessment/Plan Active Problems:   Acute on chronic respiratory failure with hypoxia (HCC)   Anoxic brain injury (HCC)   Acute renal failure superimposed on chronic kidney disease (HCC)   Pneumonia due to Pseudomonas (HCC)   Acute on chronic respiratory failure hypoxia plan is to continue with the T collar titrate oxygen continue pulmonary toilet. Anoxic brain injury no change supportive care Acute renal failure being seen by nephrology Pneumonia due to Pseudomonas treated slow improvement   I have personally seen and evaluated the patient, evaluated laboratory and imaging results, formulated the assessment and plan and placed orders. The Patient requires high complexity decision making with multiple systems involvement.  Rounds were done with the Respiratory Therapy Director and Staff therapists and discussed with nursing staff also.  01/01/2021, MD [facility] Pulmonary  Critical Care Medicine Sleep Medicine

## 2020-12-31 NOTE — Progress Notes (Signed)
[facility]  ROUNDING NOTE   Subjective:  Patient sitting up in chair this AM. Continues to have myoclonic twitches in his face. Patient had dialysis early this AM. Blood cultures thus far negative.   Objective:  Vital signs in last 24 hours:  Temperature 99.8 pulse 97 respirations 23 blood pressure 120/51  Physical Exam: General:  No acute distress  Head:  Normocephalic, atraumatic. 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  trace right lower extremity edema  Neurologic:  Awake, having myoclonic twitches in the face  Skin:  No acute rash  Access:  Left IJ PermCath    Basic Metabolic Panel: Recent Labs  Lab 12/24/20 0755 12/27/20 0629 12/29/20 0447  NA 133* 132* 130*  K 3.6 4.2 3.9  CL 95* 92* 94*  CO2 26 25 25   GLUCOSE 163* 169* 155*  BUN 99* 126* 100*  CREATININE 3.18* 3.34* 2.96*  CALCIUM 9.1 10.0 9.4  PHOS 3.0 3.0 2.2*     Liver Function Tests: Recent Labs  Lab 12/24/20 0755 12/27/20 0629 12/29/20 0447  ALBUMIN 1.9* 2.0* 2.0*    No results for input(s): LIPASE, AMYLASE in the last 168 hours. No results for input(s): AMMONIA in the last 168 hours.  CBC: Recent Labs  Lab 12/27/20 0629 12/29/20 0447 12/31/20 0641  WBC 13.1* 15.8* 17.6*  HGB 8.5* 7.1* 7.8*  HCT 26.1* 21.9* 24.7*  MCV 94.6 94.0 97.2  PLT 401* 410* 330     Cardiac Enzymes: No results for input(s): CKTOTAL, CKMB, CKMBINDEX, TROPONINI in the last 168 hours.  BNP: Invalid input(s): POCBNP  CBG: No results for input(s): GLUCAP in the last 168 hours.  Microbiology: Results for orders placed or performed during the hospital encounter of 11/21/20  Culture, blood (routine x 2)     Status: None   Collection Time: 11/25/20  3:25 PM   Specimen: BLOOD RIGHT HAND  Result Value Ref Range Status    Specimen Description BLOOD RIGHT HAND  Final   Special Requests   Final    BOTTLES DRAWN AEROBIC ONLY Blood Culture results may not be optimal due to an inadequate volume of blood received in culture bottles   Culture   Final    NO GROWTH 5 DAYS Performed at [facility], [address], [place], [place]    Report Status 11/30/2020 FINAL  Final  Culture, blood (routine x 2)     Status: None   Collection Time: 11/25/20  3:29 PM   Specimen: BLOOD LEFT HAND  Result Value Ref Range Status   Specimen Description BLOOD LEFT HAND  Final   Special Requests   Final    BOTTLES DRAWN AEROBIC AND ANAEROBIC Blood Culture adequate volume   Culture   Final    NO GROWTH 5 DAYS Performed at [facility], [address] [place], [place]    Report Status 11/30/2020 FINAL  Final  Culture, Respiratory w Gram Stain     Status: None   Collection Time: 12/06/20 10:40 AM   Specimen: Tracheal Aspirate; Respiratory  Result Value Ref Range Status   Specimen Description TRACHEAL ASPIRATE  Final   Special Requests NONE  Final   Gram Stain   Final    ABUNDANT WBC PRESENT, PREDOMINANTLY PMN ABUNDANT GRAM  NEGATIVE RODS FEW GRAM POSITIVE COCCI FEW GRAM POSITIVE RODS    Culture   Final    MODERATE PSEUDOMONAS AERUGINOSA Two isolates with different morphologies were identified as the same organism.The most resistant organism was reported. Performed at [facility], [address], [place], [place] [address]    Report Status 12/09/2020 FINAL  Final   Organism ID, Bacteria PSEUDOMONAS AERUGINOSA  Final      Susceptibility   Pseudomonas aeruginosa - MIC*    CEFTAZIDIME 16 INTERMEDIATE Intermediate     CIPROFLOXACIN 1 SENSITIVE Sensitive     GENTAMICIN 2 SENSITIVE Sensitive     IMIPENEM >=16 RESISTANT Resistant     * MODERATE PSEUDOMONAS AERUGINOSA  Culture, blood (routine x 2)     Status: None   Collection Time: 12/13/20  9:09 AM   Specimen: BLOOD LEFT HAND  Result  Value Ref Range Status   Specimen Description BLOOD LEFT HAND  Final   Special Requests   Final    BOTTLES DRAWN AEROBIC AND ANAEROBIC Blood Culture results may not be optimal due to an inadequate volume of blood received in culture bottles   Culture   Final    NO GROWTH 5 DAYS Performed at [facility], [address] [place], [place] [address]    Report Status 12/18/2020 FINAL  Final  Culture, blood (routine x 2)     Status: Abnormal   Collection Time: 12/13/20  9:13 AM   Specimen: BLOOD  Result Value Ref Range Status   Specimen Description BLOOD RIGHT ANTECUBITAL  Final   Special Requests   Final    BOTTLES DRAWN AEROBIC AND ANAEROBIC Blood Culture adequate volume   Culture  Setup Time   Final    GRAM POSITIVE COCCI IN CLUSTERS AEROBIC BOTTLE ONLY CRITICAL RESULT CALLED TO, READ BACK BY AND VERIFIED WITH: RN C.ROBENSON AT 1234 ON 12/14/2020 BY T.SAAD.    Culture (A)  Final    STAPHYLOCOCCUS EPIDERMIDIS THE SIGNIFICANCE OF ISOLATING THIS ORGANISM FROM A SINGLE SET OF BLOOD CULTURES WHEN MULTIPLE SETS ARE DRAWN IS UNCERTAIN. PLEASE NOTIFY THE MICROBIOLOGY DEPARTMENT WITHIN ONE WEEK IF SPECIATION AND SENSITIVITIES ARE REQUIRED. Performed at [facility], [address], [place], [place] [address]    Report Status 12/16/2020 FINAL  Final  Blood Culture ID Panel (Reflexed)     Status: Abnormal   Collection Time: 12/13/20  9:13 AM  Result Value Ref Range Status   Enterococcus faecalis NOT DETECTED NOT DETECTED Final   Enterococcus Faecium NOT DETECTED NOT DETECTED Final   Listeria monocytogenes NOT DETECTED NOT DETECTED Final   Staphylococcus species DETECTED (A) NOT DETECTED Final    Comment: CRITICAL RESULT CALLED TO, READ BACK BY AND VERIFIED WITH: RN C.ROBERTSON AT 1234 ON 12/14/2020 BY T.SAAD.    Staphylococcus aureus (BCID) NOT DETECTED NOT DETECTED Final   Staphylococcus epidermidis DETECTED (A) NOT DETECTED Final    Comment: Methicillin (oxacillin) resistant  coagulase negative staphylococcus. Possible blood culture contaminant (unless isolated from more than one blood culture draw or clinical case suggests pathogenicity). No antibiotic treatment is indicated for blood  culture contaminants. CRITICAL RESULT CALLED TO, READ BACK BY AND VERIFIED WITH: RN C.ROBERTSON AT 1234 ON 12/14/2020 BY T.SAAD.    Staphylococcus lugdunensis NOT DETECTED NOT DETECTED Final   Streptococcus species NOT DETECTED NOT DETECTED Final   Streptococcus agalactiae NOT DETECTED NOT DETECTED Final   Streptococcus pneumoniae NOT DETECTED NOT DETECTED Final   Streptococcus pyogenes NOT DETECTED NOT  DETECTED Final   A.calcoaceticus-baumannii NOT DETECTED NOT DETECTED Final   Bacteroides fragilis NOT DETECTED NOT DETECTED Final   Enterobacterales NOT DETECTED NOT DETECTED Final   Enterobacter cloacae complex NOT DETECTED NOT DETECTED Final   Escherichia coli NOT DETECTED NOT DETECTED Final   Klebsiella aerogenes NOT DETECTED NOT DETECTED Final   Klebsiella oxytoca NOT DETECTED NOT DETECTED Final   Klebsiella pneumoniae NOT DETECTED NOT DETECTED Final   Proteus species NOT DETECTED NOT DETECTED Final   Salmonella species NOT DETECTED NOT DETECTED Final   Serratia marcescens NOT DETECTED NOT DETECTED Final   Haemophilus influenzae NOT DETECTED NOT DETECTED Final   Neisseria meningitidis NOT DETECTED NOT DETECTED Final   Pseudomonas aeruginosa NOT DETECTED NOT DETECTED Final   Stenotrophomonas maltophilia NOT DETECTED NOT DETECTED Final   Candida albicans NOT DETECTED NOT DETECTED Final   Candida auris NOT DETECTED NOT DETECTED Final   Candida glabrata NOT DETECTED NOT DETECTED Final   Candida krusei NOT DETECTED NOT DETECTED Final   Candida parapsilosis NOT DETECTED NOT DETECTED Final   Candida tropicalis NOT DETECTED NOT DETECTED Final   Cryptococcus neoformans/gattii NOT DETECTED NOT DETECTED Final   Methicillin resistance mecA/C DETECTED (A) NOT DETECTED Final     Comment: CRITICAL RESULT CALLED TO, READ BACK BY AND VERIFIED WITH: RN C.ROBERTSON AT 1234 ON 12/14/2020 BY T.SAAD. Performed at [facility], [address] [place], [place] [address]   Culture, blood (routine x 2)     Status: None   Collection Time: 12/17/20 11:31 AM   Specimen: BLOOD  Result Value Ref Range Status   Specimen Description BLOOD RIGHT ANTECUBITAL  Final   Special Requests   Final    BOTTLES DRAWN AEROBIC ONLY Blood Culture results may not be optimal due to an inadequate volume of blood received in culture bottles   Culture   Final    NO GROWTH 5 DAYS Performed at [facility], [address] [place], [place] [address]    Report Status 12/22/2020 FINAL  Final  Culture, blood (routine x 2)     Status: None   Collection Time: 12/17/20 11:38 AM   Specimen: BLOOD LEFT HAND  Result Value Ref Range Status   Specimen Description BLOOD LEFT HAND  Final   Special Requests   Final    BOTTLES DRAWN AEROBIC ONLY Blood Culture adequate volume   Culture   Final    NO GROWTH 5 DAYS Performed at [facility], [address] [place], [place] [address]    Report Status 12/22/2020 FINAL  Final  Culture, blood (routine x 2)     Status: None (Preliminary result)   Collection Time: 12/29/20  8:10 AM   Specimen: BLOOD RIGHT HAND  Result Value Ref Range Status   Specimen Description BLOOD RIGHT HAND  Final   Special Requests   Final    BOTTLES DRAWN AEROBIC AND ANAEROBIC Blood Culture results may not be optimal due to an inadequate volume of blood received in culture bottles   Culture   Final    NO GROWTH 1 DAY Performed at [facility], [address] [place], [place] [address]    Report Status PENDING  Incomplete  Culture, blood (routine x 2)     Status: None (Preliminary result)   Collection Time: 12/29/20  8:17 AM   Specimen: BLOOD RIGHT HAND  Result Value Ref Range Status   Specimen Description BLOOD RIGHT HAND  Final   Special  Requests   Final     BOTTLES DRAWN AEROBIC ONLY Blood Culture results may not be optimal due to an inadequate volume of blood received in culture bottles   Culture   Final    NO GROWTH 1 DAY Performed at [facility], [address], [place], [place] [address]    Report Status PENDING  Incomplete    Coagulation Studies: No results for input(s): LABPROT, INR in the last 72 hours.  Urinalysis: No results for input(s): COLORURINE, LABSPEC, PHURINE, GLUCOSEU, HGBUR, BILIRUBINUR, KETONESUR, PROTEINUR, UROBILINOGEN, NITRITE, LEUKOCYTESUR in the last 72 hours.  Invalid input(s): APPERANCEUR    Imaging: DG CHEST PORT 1 VIEW  Result Date: 12/30/2020 CLINICAL DATA:  Fever. EXAM: PORTABLE CHEST 1 VIEW COMPARISON:  12/17/2020 FINDINGS: Tracheostomy tube and left IJ central line again noted. The right IJ central line remains in place with tip overlying the SVC/RA junction. Cardiopericardial silhouette is at upper limits of normal for size. There is pulmonary vascular congestion without overt pulmonary edema. No focal consolidation or overt pulmonary edema. IMPRESSION: Pulmonary vascular congestion without overt pulmonary edema. Electronically Signed   By: Kennith Center M.D.   On: 12/30/2020 15:27     Medications:       Assessment/ Plan:  65 y.o. male with a PMHx of ESRD on HD TTS, anemia of chronic kidney disease, hypertension, acute respiratory failure status post tracheostomy placement, seizure disorder, hyperlipidemia, diabetes mellitus type 2, history of hyperkalemia, history of coronary artery disease, peripheral vascular disease who was admitted to [facility] on 11/21/2020 for ongoing treatment of acute respiratory failure status post tracheostomy placement and ESRD.   1.  ESRD on HD.    Patient had dialysis early this AM.  Continue dialysis on MWF schedule.  2.  Acute respiratory failure.  Respiratory status appears stable and patient breathing comfortably through tracheostomy.  3.  Anemia of  chronic kidney disease.  Hemoglobin up a bit this a.m. to 7.8.  Continue Retacrit.  Consider blood transfusion for hemoglobin of 7 or less.  4.  Secondary hyperparathyroidism.  Most recent serum phosphorus was 2.2.  Awaiting repeat today.  5.  Fever.  Blood cultures drawn on 12/29/2020 thus far negative.  WBC count rising to 17.6.  Further work-up as per hospitalist.   LOS: 0 Rashaunda Rahl 6/24/20227:26 AM

## 2021-01-03 DIAGNOSIS — N179 Acute kidney failure, unspecified: Secondary | ICD-10-CM | POA: Diagnosis not present

## 2021-01-03 DIAGNOSIS — G931 Anoxic brain damage, not elsewhere classified: Secondary | ICD-10-CM | POA: Diagnosis not present

## 2021-01-03 DIAGNOSIS — J9621 Acute and chronic respiratory failure with hypoxia: Secondary | ICD-10-CM | POA: Diagnosis not present

## 2021-01-03 DIAGNOSIS — J151 Pneumonia due to Pseudomonas: Secondary | ICD-10-CM | POA: Diagnosis not present

## 2021-01-03 LAB — RENAL FUNCTION PANEL
Albumin: 2 g/dL — ABNORMAL LOW (ref 3.5–5.0)
Anion gap: 12 (ref 5–15)
BUN: 122 mg/dL — ABNORMAL HIGH (ref 8–23)
CO2: 25 mmol/L (ref 22–32)
Calcium: 9.6 mg/dL (ref 8.9–10.3)
Chloride: 92 mmol/L — ABNORMAL LOW (ref 98–111)
Creatinine, Ser: 3.9 mg/dL — ABNORMAL HIGH (ref 0.61–1.24)
GFR, Estimated: 16 mL/min — ABNORMAL LOW (ref >60)
Glucose, Bld: 171 mg/dL — ABNORMAL HIGH (ref 70–99)
Phosphorus: 1.9 mg/dL — ABNORMAL LOW (ref 2.5–4.6)
Potassium: 2.8 mmol/L — ABNORMAL LOW (ref 3.5–5.1)
Sodium: 129 mmol/L — ABNORMAL LOW (ref 135–145)

## 2021-01-03 LAB — CBC
HCT: 20.1 % — ABNORMAL LOW (ref 39.0–52.0)
HCT: 21.6 % — ABNORMAL LOW (ref 39.0–52.0)
Hemoglobin: 6.4 g/dL — CL (ref 13.0–17.0)
Hemoglobin: 7.3 g/dL — ABNORMAL LOW (ref 13.0–17.0)
MCH: 30.9 pg (ref 26.0–34.0)
MCH: 31.3 pg (ref 26.0–34.0)
MCHC: 31.8 g/dL (ref 30.0–36.0)
MCHC: 33.8 g/dL (ref 30.0–36.0)
MCV: 97.1 fL (ref 80.0–100.0)
MCV: 92.7 fL (ref 80.0–100.0)
Platelets: 350 10*3/uL (ref 150–400)
Platelets: 326 10*3/uL (ref 150–400)
RBC: 2.07 MIL/uL — ABNORMAL LOW (ref 4.22–5.81)
RBC: 2.33 MIL/uL — ABNORMAL LOW (ref 4.22–5.81)
RDW: 18.7 % — ABNORMAL HIGH (ref 11.5–15.5)
RDW: 18.9 % — ABNORMAL HIGH (ref 11.5–15.5)
WBC: 11.7 10*3/uL — ABNORMAL HIGH (ref 4.0–10.5)
WBC: 13.5 10*3/uL — ABNORMAL HIGH (ref 4.0–10.5)
nRBC: 0 % (ref 0.0–0.2)
nRBC: 0 % (ref 0.0–0.2)

## 2021-01-03 LAB — CULTURE, BLOOD (ROUTINE X 2)
Culture: NO GROWTH
Culture: NO GROWTH

## 2021-01-03 LAB — CULTURE, RESPIRATORY W GRAM STAIN

## 2021-01-03 LAB — PREPARE RBC (CROSSMATCH)

## 2021-01-03 LAB — VANCOMYCIN, TROUGH: Vancomycin Tr: 25 ug/mL (ref 15–20)

## 2021-01-03 NOTE — Progress Notes (Signed)
[name]  ROUNDING NOTE   Subjective:  Patient sitting in chair at the moment. Due for hemodialysis treatment today. It appears that family wants ongoing aggressive care.   Objective:  Vital signs in last 24 hours:  Temperature 100.1 pulse 93 respirations 23 blood pressure 116/51  Physical Exam: General:  No acute distress  Head:  Normocephalic, atraumatic. 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  trace right lower extremity edema  Neurologic:  Awake, having myoclonic twitches in the face  Skin:  No acute rash  Access:  Left IJ PermCath    Basic Metabolic Panel: Recent Labs  Lab 12/29/20 0447 12/31/20 0641  NA 130* 131*  K 3.9 3.2*  CL 94* 96*  CO2 25 26  GLUCOSE 155* 176*  BUN 100* 31*  CREATININE 2.96* 1.69*  CALCIUM 9.4 9.0  PHOS 2.2* 1.3*     Liver Function Tests: Recent Labs  Lab 12/29/20 0447 12/31/20 0641  ALBUMIN 2.0* 2.2*    No results for input(s): LIPASE, AMYLASE in the last 168 hours. No results for input(s): AMMONIA in the last 168 hours.  CBC: Recent Labs  Lab 12/29/20 0447 12/31/20 0641  WBC 15.8* 17.6*  HGB 7.1* 7.8*  HCT 21.9* 24.7*  MCV 94.0 97.2  PLT 410* 330     Cardiac Enzymes: No results for input(s): CKTOTAL, CKMB, CKMBINDEX, TROPONINI in the last 168 hours.  BNP: Invalid input(s): POCBNP  CBG: No results for input(s): GLUCAP in the last 168 hours.  Microbiology: Results for orders placed or performed during the hospital encounter of 11/21/20  Culture, blood (routine x 2)     Status: None   Collection Time: 11/25/20  3:25 PM   Specimen: BLOOD RIGHT HAND  Result Value Ref Range Status   Specimen Description BLOOD RIGHT HAND  Final   Special Requests   Final    BOTTLES DRAWN AEROBIC ONLY Blood Culture results may not be  optimal due to an inadequate volume of blood received in culture bottles   Culture   Final    NO GROWTH 5 DAYS Performed at [facility], [address] [place], [place] [id number]    Report Status 11/30/2020 FINAL  Final  Culture, blood (routine x 2)     Status: None   Collection Time: 11/25/20  3:29 PM   Specimen: BLOOD LEFT HAND  Result Value Ref Range Status   Specimen Description BLOOD LEFT HAND  Final   Special Requests   Final    BOTTLES DRAWN AEROBIC AND ANAEROBIC Blood Culture adequate volume   Culture   Final    NO GROWTH 5 DAYS Performed at [facility], [address] [place], [place] [address]    Report Status 11/30/2020 FINAL  Final  Culture, Respiratory w Gram Stain     Status: None   Collection Time: 12/06/20 10:40 AM   Specimen: Tracheal Aspirate; Respiratory  Result Value Ref Range Status   Specimen Description TRACHEAL ASPIRATE  Final   Special Requests NONE  Final   Gram Stain   Final    ABUNDANT WBC PRESENT, PREDOMINANTLY PMN ABUNDANT GRAM NEGATIVE RODS FEW GRAM POSITIVE COCCI FEW GRAM POSITIVE RODS    Culture   Final    MODERATE PSEUDOMONAS AERUGINOSA Two isolates with different  morphologies were identified as the same organism.The most resistant organism was reported. Performed at [name], [address] [place], [place] [address]    Report Status 12/09/2020 FINAL  Final   Organism ID, Bacteria PSEUDOMONAS AERUGINOSA  Final      Susceptibility   Pseudomonas aeruginosa - MIC*    CEFTAZIDIME 16 INTERMEDIATE Intermediate     CIPROFLOXACIN 1 SENSITIVE Sensitive     GENTAMICIN 2 SENSITIVE Sensitive     IMIPENEM >=16 RESISTANT Resistant     * MODERATE PSEUDOMONAS AERUGINOSA  Culture, blood (routine x 2)     Status: None   Collection Time: 12/13/20  9:09 AM   Specimen: BLOOD LEFT HAND  Result Value Ref Range Status   Specimen Description BLOOD LEFT HAND  Final   Special Requests   Final    BOTTLES DRAWN AEROBIC AND ANAEROBIC  Blood Culture results may not be optimal due to an inadequate volume of blood received in culture bottles   Culture   Final    NO GROWTH 5 DAYS Performed at [facility], [address] [place], [place] [address]    Report Status 12/18/2020 FINAL  Final  Culture, blood (routine x 2)     Status: Abnormal   Collection Time: 12/13/20  9:13 AM   Specimen: BLOOD  Result Value Ref Range Status   Specimen Description BLOOD RIGHT ANTECUBITAL  Final   Special Requests   Final    BOTTLES DRAWN AEROBIC AND ANAEROBIC Blood Culture adequate volume   Culture  Setup Time   Final    GRAM POSITIVE COCCI IN CLUSTERS AEROBIC BOTTLE ONLY CRITICAL RESULT CALLED TO, READ BACK BY AND VERIFIED WITH: RN C.ROBENSON AT 1234 ON 12/14/2020 BY T.SAAD.    Culture (A)  Final    STAPHYLOCOCCUS EPIDERMIDIS THE SIGNIFICANCE OF ISOLATING THIS ORGANISM FROM A SINGLE SET OF BLOOD CULTURES WHEN MULTIPLE SETS ARE DRAWN IS UNCERTAIN. PLEASE NOTIFY THE MICROBIOLOGY DEPARTMENT WITHIN ONE WEEK IF SPECIATION AND SENSITIVITIES ARE REQUIRED. Performed at [facility], [address] [place], [place] [address]    Report Status 12/16/2020 FINAL  Final  Blood Culture ID Panel (Reflexed)     Status: Abnormal   Collection Time: 12/13/20  9:13 AM  Result Value Ref Range Status   Enterococcus faecalis NOT DETECTED NOT DETECTED Final   Enterococcus Faecium NOT DETECTED NOT DETECTED Final   Listeria monocytogenes NOT DETECTED NOT DETECTED Final   Staphylococcus species DETECTED (A) NOT DETECTED Final    Comment: CRITICAL RESULT CALLED TO, READ BACK BY AND VERIFIED WITH: RN C.ROBERTSON AT 1234 ON 12/14/2020 BY T.SAAD.    Staphylococcus aureus (BCID) NOT DETECTED NOT DETECTED Final   Staphylococcus epidermidis DETECTED (A) NOT DETECTED Final    Comment: Methicillin (oxacillin) resistant coagulase negative staphylococcus. Possible blood culture contaminant (unless isolated from more than one blood culture draw or clinical  case suggests pathogenicity). No antibiotic treatment is indicated for blood  culture contaminants. CRITICAL RESULT CALLED TO, READ BACK BY AND VERIFIED WITH: RN C.ROBERTSON AT 1234 ON 12/14/2020 BY T.SAAD.    Staphylococcus lugdunensis NOT DETECTED NOT DETECTED Final   Streptococcus species NOT DETECTED NOT DETECTED Final   Streptococcus agalactiae NOT DETECTED NOT DETECTED Final   Streptococcus pneumoniae NOT DETECTED NOT DETECTED Final   Streptococcus pyogenes NOT DETECTED NOT DETECTED Final   A.calcoaceticus-baumannii NOT DETECTED NOT DETECTED Final   Bacteroides fragilis NOT DETECTED NOT DETECTED Final   Enterobacterales NOT DETECTED NOT DETECTED Final  Enterobacter cloacae complex NOT DETECTED NOT DETECTED Final   Escherichia coli NOT DETECTED NOT DETECTED Final   Klebsiella aerogenes NOT DETECTED NOT DETECTED Final   Klebsiella oxytoca NOT DETECTED NOT DETECTED Final   Klebsiella pneumoniae NOT DETECTED NOT DETECTED Final   Proteus species NOT DETECTED NOT DETECTED Final   Salmonella species NOT DETECTED NOT DETECTED Final   Serratia marcescens NOT DETECTED NOT DETECTED Final   Haemophilus influenzae NOT DETECTED NOT DETECTED Final   Neisseria meningitidis NOT DETECTED NOT DETECTED Final   Pseudomonas aeruginosa NOT DETECTED NOT DETECTED Final   Stenotrophomonas maltophilia NOT DETECTED NOT DETECTED Final   Candida albicans NOT DETECTED NOT DETECTED Final   Candida auris NOT DETECTED NOT DETECTED Final   Candida glabrata NOT DETECTED NOT DETECTED Final   Candida krusei NOT DETECTED NOT DETECTED Final   Candida parapsilosis NOT DETECTED NOT DETECTED Final   Candida tropicalis NOT DETECTED NOT DETECTED Final   Cryptococcus neoformans/gattii NOT DETECTED NOT DETECTED Final   Methicillin resistance mecA/C DETECTED (A) NOT DETECTED Final    Comment: CRITICAL RESULT CALLED TO, READ BACK BY AND VERIFIED WITH: RN C.ROBERTSON AT 1234 ON 12/14/2020 BY T.SAAD. Performed at [facility], [address], [place], [place] [address]   Culture, blood (routine x 2)     Status: None   Collection Time: 12/17/20 11:31 AM   Specimen: BLOOD  Result Value Ref Range Status   Specimen Description BLOOD RIGHT ANTECUBITAL  Final   Special Requests   Final    BOTTLES DRAWN AEROBIC ONLY Blood Culture results may not be optimal due to an inadequate volume of blood received in culture bottles   Culture   Final    NO GROWTH 5 DAYS Performed at [facility], [address], [place], [place] [address]    Report Status 12/22/2020 FINAL  Final  Culture, blood (routine x 2)     Status: None   Collection Time: 12/17/20 11:38 AM   Specimen: BLOOD LEFT HAND  Result Value Ref Range Status   Specimen Description BLOOD LEFT HAND  Final   Special Requests   Final    BOTTLES DRAWN AEROBIC ONLY Blood Culture adequate volume   Culture   Final    NO GROWTH 5 DAYS Performed at [facility], [address], [place], [place] [address]    Report Status 12/22/2020 FINAL  Final  Culture, blood (routine x 2)     Status: None   Collection Time: 12/29/20  8:10 AM   Specimen: BLOOD RIGHT HAND  Result Value Ref Range Status   Specimen Description BLOOD RIGHT HAND  Final   Special Requests   Final    BOTTLES DRAWN AEROBIC AND ANAEROBIC Blood Culture results may not be optimal due to an inadequate volume of blood received in culture bottles   Culture   Final    NO GROWTH 5 DAYS Performed at [facility], [address] [place], [place] [address]    Report Status 01/03/2021 FINAL  Final  Culture, blood (routine x 2)     Status: None   Collection Time: 12/29/20  8:17 AM   Specimen: BLOOD RIGHT HAND  Result Value Ref Range Status   Specimen Description BLOOD RIGHT HAND  Final   Special Requests   Final    BOTTLES DRAWN AEROBIC ONLY Blood Culture results may not be optimal due to an inadequate volume of blood received in culture bottles   Culture  Final    NO GROWTH 5  DAYS Performed at [facility], [address] [place], [place] [address]    Report Status 01/03/2021 FINAL  Final  Culture, Respiratory w Gram Stain     Status: None (Preliminary result)   Collection Time: 01/01/21 11:54 AM   Specimen: Tracheal Aspirate; Respiratory  Result Value Ref Range Status   Specimen Description TRACHEAL ASPIRATE  Final   Special Requests NONE  Final   Gram Stain   Final    RARE WBC PRESENT,BOTH PMN AND MONONUCLEAR FEW GRAM NEGATIVE RODS    Culture   Final    MODERATE PSEUDOMONAS AERUGINOSA SUSCEPTIBILITIES TO FOLLOW Performed at [facility], [address], [place], [place] [address]    Report Status PENDING  Incomplete    Coagulation Studies: No results for input(s): LABPROT, INR in the last 72 hours.  Urinalysis: No results for input(s): COLORURINE, LABSPEC, PHURINE, GLUCOSEU, HGBUR, BILIRUBINUR, KETONESUR, PROTEINUR, UROBILINOGEN, NITRITE, LEUKOCYTESUR in the last 72 hours.  Invalid input(s): APPERANCEUR    Imaging: No results found.   Medications:       Assessment/ Plan:  66 y.o. male with a PMHx of ESRD on HD TTS, anemia of chronic kidney disease, hypertension, acute respiratory failure status post tracheostomy placement, seizure disorder, hyperlipidemia, diabetes mellitus type 2, history of hyperkalemia, history of coronary artery disease, peripheral vascular disease who was admitted to [facility] on 11/21/2020 for ongoing treatment of acute respiratory failure status post tracheostomy placement and ESRD.   1.  ESRD on HD.    Maintain the patient on MWF dialysis schedule.  He is due for dialysis treatment today.  2.  Acute respiratory failure.  Continues to breathe comfortably through tracheostomy.  3.  Anemia of chronic kidney disease.  Repeat CBC today.  Most recent hemoglobin was 7.8.  Maintain the patient on Retacrit.  4.  Secondary hyperparathyroidism.  Repeat serum phosphorus today.  5.  Fever.  Blood  cultures remain negative.  Sputum growing Pseudomonas.  Treatment as per hospitalist.   LOS: 0 Kiasia Chou 6/27/20227:22 AM

## 2021-01-04 ENCOUNTER — Other Ambulatory Visit (HOSPITAL_COMMUNITY): Payer: Medicare Other

## 2021-01-04 DIAGNOSIS — J151 Pneumonia due to Pseudomonas: Secondary | ICD-10-CM | POA: Diagnosis not present

## 2021-01-04 DIAGNOSIS — J9621 Acute and chronic respiratory failure with hypoxia: Secondary | ICD-10-CM | POA: Diagnosis not present

## 2021-01-04 DIAGNOSIS — G931 Anoxic brain damage, not elsewhere classified: Secondary | ICD-10-CM | POA: Diagnosis not present

## 2021-01-04 DIAGNOSIS — N179 Acute kidney failure, unspecified: Secondary | ICD-10-CM | POA: Diagnosis not present

## 2021-01-04 LAB — TYPE AND SCREEN
ABO/RH(D): O POS
Antibody Screen: NEGATIVE
Unit division: 0

## 2021-01-04 LAB — BPAM RBC
Blood Product Expiration Date: 202207032359
ISSUE DATE / TIME: 202206271236
Unit Type and Rh: 5100

## 2021-01-04 LAB — PHENYTOIN LEVEL, TOTAL: Phenytoin Lvl: 11.1 ug/mL (ref 10.0–20.0)

## 2021-01-04 NOTE — Progress Notes (Signed)
Pulmonary Critical Care Medicine [facility] [place]   PULMONARY CRITICAL CARE SERVICE  PROGRESS NOTE     Joel Hebert  RWE:[id number]  DOB: 06/29/1956   DOA: 11/21/2020  Referring Physician: Carron Curie, MD  HPI: Joel Hebert is a 65 y.o. male being followed for ventilator/airway/oxygen weaning Acute on Chronic Respiratory Failure.  Patient is on T collar room air no major improvements have been noted  Medications: Reviewed on Rounds  Physical Exam:  Vitals: Temperature was 98.6 pulse 93 respiratory rate is 19 blood pressure 116/51 saturations 97%  Ventilator Settings on T collar right now room air  General: Comfortable at this time Neck: supple Cardiovascular: no malignant arrhythmias Respiratory: No rhonchi very coarse breath sound Skin: no rash seen on limited exam Musculoskeletal: No gross abnormality Psychiatric:unable to assess Neurologic:no involuntary movements         Lab Data:   Basic Metabolic Panel: Recent Labs  Lab 12/29/20 0447 12/31/20 0641 01/03/21 0635  NA 130* 131* 129*  K 3.9 3.2* 2.8*  CL 94* 96* 92*  CO2 25 26 25   GLUCOSE 155* 176* 171*  BUN 100* 31* 122*  CREATININE 2.96* 1.69* 3.90*  CALCIUM 9.4 9.0 9.6  PHOS 2.2* 1.3* 1.9*    ABG: No results for input(s): PHART, PCO2ART, PO2ART, HCO3, O2SAT in the last 168 hours.  Liver Function Tests: Recent Labs  Lab 12/29/20 0447 12/31/20 0641 01/03/21 0635  ALBUMIN 2.0* 2.2* 2.0*   No results for input(s): LIPASE, AMYLASE in the last 168 hours. No results for input(s): AMMONIA in the last 168 hours.  CBC: Recent Labs  Lab 12/29/20 0447 12/31/20 0641 01/03/21 0635 01/03/21 1639  WBC 15.8* 17.6* 11.7* 13.5*  HGB 7.1* 7.8* 6.4* 7.3*  HCT 21.9* 24.7* 20.1* 21.6*  MCV 94.0 97.2 97.1 92.7  PLT 410* 330 350 326    Cardiac Enzymes: No results for input(s): CKTOTAL, CKMB, CKMBINDEX, TROPONINI in the last 168 hours.  BNP (last 3 results) No results for  input(s): BNP in the last 8760 hours.  ProBNP (last 3 results) No results for input(s): PROBNP in the last 8760 hours.  Radiological Exams: No results found.  Assessment/Plan Active Problems:   Acute on chronic respiratory failure with hypoxia (HCC)   Anoxic brain injury (HCC)   Acute renal failure superimposed on chronic kidney disease (HCC)   Pneumonia due to Pseudomonas (HCC)   Acute on chronic respiratory failure hypoxia we will continue with the T-piece.  Patient is actually doing well however because of mental state not able to do any further weaning Anoxic brain injury no improvement has been noted Acute renal failure patient continues to be dialysis dependent nephrology following Pseudomonal pneumonia has been treated slow improvement   I have personally seen and evaluated the patient, evaluated laboratory and imaging results, formulated the assessment and plan and placed orders. The Patient requires high complexity decision making with multiple systems involvement.  Rounds were done with the Respiratory Therapy Director and Staff therapists and discussed with nursing staff also.  01/05/21, MD [facility] Pulmonary Critical Care Medicine Sleep Medicine

## 2021-01-04 NOTE — Progress Notes (Signed)
Pulmonary Critical Care Medicine [facility] [place]   PULMONARY CRITICAL CARE SERVICE  PROGRESS NOTE     Joel Hebert  YYT:[id number]  DOB: Dec 06, 1955   DOA: 11/21/2020  Referring Physician: Carron Curie, MD  HPI: Joel Hebert is a 65 y.o. male being followed for ventilator/airway/oxygen weaning Acute on Chronic Respiratory Failure.  Patient is on T collar at baseline right now is on 28% FiO2  Medications: Reviewed on Rounds  Physical Exam:  Vitals: Temperature 101.1 pulse 104 respiratory 22 blood pressure is 166/73 saturations 95%  Ventilator Settings off the ventilator on T collar  General: Comfortable at this time Neck: supple Cardiovascular: no malignant arrhythmias Respiratory: Scattered coarse breath sounds are noted Skin: no rash seen on limited exam Musculoskeletal: No gross abnormality Psychiatric:unable to assess Neurologic:no involuntary movements         Lab Data:   Basic Metabolic Panel: Recent Labs  Lab 12/29/20 0447 12/31/20 0641 01/03/21 0635  NA 130* 131* 129*  K 3.9 3.2* 2.8*  CL 94* 96* 92*  CO2 25 26 25   GLUCOSE 155* 176* 171*  BUN 100* 31* 122*  CREATININE 2.96* 1.69* 3.90*  CALCIUM 9.4 9.0 9.6  PHOS 2.2* 1.3* 1.9*    ABG: No results for input(s): PHART, PCO2ART, PO2ART, HCO3, O2SAT in the last 168 hours.  Liver Function Tests: Recent Labs  Lab 12/29/20 0447 12/31/20 0641 01/03/21 0635  ALBUMIN 2.0* 2.2* 2.0*   No results for input(s): LIPASE, AMYLASE in the last 168 hours. No results for input(s): AMMONIA in the last 168 hours.  CBC: Recent Labs  Lab 12/29/20 0447 12/31/20 0641 01/03/21 0635 01/03/21 1639  WBC 15.8* 17.6* 11.7* 13.5*  HGB 7.1* 7.8* 6.4* 7.3*  HCT 21.9* 24.7* 20.1* 21.6*  MCV 94.0 97.2 97.1 92.7  PLT 410* 330 350 326    Cardiac Enzymes: No results for input(s): CKTOTAL, CKMB, CKMBINDEX, TROPONINI in the last 168 hours.  BNP (last 3 results) No results for input(s):  BNP in the last 8760 hours.  ProBNP (last 3 results) No results for input(s): PROBNP in the last 8760 hours.  Radiological Exams: No results found.  Assessment/Plan Active Problems:   Acute on chronic respiratory failure with hypoxia (HCC)   Anoxic brain injury (HCC)   Acute renal failure superimposed on chronic kidney disease (HCC)   Pneumonia due to Pseudomonas (HCC)   Acute on chronic respiratory failure with hypoxia plan is going to be to continue with T collar now patient has a fever this will be worked up Anoxic brain injury no change we will continue to monitor closely Pneumonia patient has had Pseudomonas in the past now has a fever and this needs to be worked up Acute renal failure being followed by nephrology for end-stage disease   I have personally seen and evaluated the patient, evaluated laboratory and imaging results, formulated the assessment and plan and placed orders. The Patient requires high complexity decision making with multiple systems involvement.  Rounds were done with the Respiratory Therapy Director and Staff therapists and discussed with nursing staff also.  01/05/21, MD [facility] Pulmonary Critical Care Medicine Sleep Medicine

## 2021-01-05 DIAGNOSIS — J9621 Acute and chronic respiratory failure with hypoxia: Secondary | ICD-10-CM | POA: Diagnosis not present

## 2021-01-05 DIAGNOSIS — N179 Acute kidney failure, unspecified: Secondary | ICD-10-CM | POA: Diagnosis not present

## 2021-01-05 DIAGNOSIS — G931 Anoxic brain damage, not elsewhere classified: Secondary | ICD-10-CM | POA: Diagnosis not present

## 2021-01-05 DIAGNOSIS — J151 Pneumonia due to Pseudomonas: Secondary | ICD-10-CM | POA: Diagnosis not present

## 2021-01-05 LAB — RENAL FUNCTION PANEL
Albumin: 2 g/dL — ABNORMAL LOW (ref 3.5–5.0)
Anion gap: 14 (ref 5–15)
BUN: 81 mg/dL — ABNORMAL HIGH (ref 8–23)
CO2: 23 mmol/L (ref 22–32)
Calcium: 8.9 mg/dL (ref 8.9–10.3)
Chloride: 94 mmol/L — ABNORMAL LOW (ref 98–111)
Creatinine, Ser: 2.94 mg/dL — ABNORMAL HIGH (ref 0.61–1.24)
GFR, Estimated: 23 mL/min — ABNORMAL LOW (ref >60)
Glucose, Bld: 156 mg/dL — ABNORMAL HIGH (ref 70–99)
Phosphorus: 1.7 mg/dL — ABNORMAL LOW (ref 2.5–4.6)
Potassium: 3.3 mmol/L — ABNORMAL LOW (ref 3.5–5.1)
Sodium: 131 mmol/L — ABNORMAL LOW (ref 135–145)

## 2021-01-05 LAB — CBC
HCT: 24.3 % — ABNORMAL LOW (ref 39.0–52.0)
Hemoglobin: 8.2 g/dL — ABNORMAL LOW (ref 13.0–17.0)
MCH: 31.5 pg (ref 26.0–34.0)
MCHC: 33.7 g/dL (ref 30.0–36.0)
MCV: 93.5 fL (ref 80.0–100.0)
Platelets: 294 10*3/uL (ref 150–400)
RBC: 2.6 MIL/uL — ABNORMAL LOW (ref 4.22–5.81)
RDW: 18.7 % — ABNORMAL HIGH (ref 11.5–15.5)
WBC: 13.7 10*3/uL — ABNORMAL HIGH (ref 4.0–10.5)
nRBC: 0 % (ref 0.0–0.2)

## 2021-01-05 NOTE — Progress Notes (Signed)
[facility]  ROUNDING NOTE   Subjective:  Patient seen and evaluated at bedside. Still having bouts of fever. Temperature was 102.2.   Objective:  Vital signs in last 24 hours:  Temperature 102.2 pulse 89 respirations 30 blood pressure 103/50  Physical Exam: General:  No acute distress  Head:  Normocephalic, atraumatic. 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  trace right lower extremity edema  Neurologic: Resting at the moment  Skin:  No acute rash  Access:  Left IJ PermCath    Basic Metabolic Panel: Recent Labs  Lab 12/31/20 0641 01/03/21 0635 01/05/21 0550  NA 131* 129* 131*  K 3.2* 2.8* 3.3*  CL 96* 92* 94*  CO2 26 25 23   GLUCOSE 176* 171* 156*  BUN 31* 122* 81*  CREATININE 1.69* 3.90* 2.94*  CALCIUM 9.0 9.6 8.9  PHOS 1.3* 1.9* 1.7*     Liver Function Tests: Recent Labs  Lab 12/31/20 0641 01/03/21 0635 01/05/21 0550  ALBUMIN 2.2* 2.0* 2.0*    No results for input(s): LIPASE, AMYLASE in the last 168 hours. No results for input(s): AMMONIA in the last 168 hours.  CBC: Recent Labs  Lab 12/31/20 0641 01/03/21 0635 01/03/21 1639 01/05/21 0550  WBC 17.6* 11.7* 13.5* 13.7*  HGB 7.8* 6.4* 7.3* 8.2*  HCT 24.7* 20.1* 21.6* 24.3*  MCV 97.2 97.1 92.7 93.5  PLT 330 350 326 294     Cardiac Enzymes: No results for input(s): CKTOTAL, CKMB, CKMBINDEX, TROPONINI in the last 168 hours.  BNP: Invalid input(s): POCBNP  CBG: No results for input(s): GLUCAP in the last 168 hours.  Microbiology: Results for orders placed or performed during the hospital encounter of 11/21/20  Culture, blood (routine x 2)     Status: None   Collection Time: 11/25/20  3:25 PM   Specimen: BLOOD RIGHT HAND  Result Value Ref Range Status   Specimen Description BLOOD RIGHT HAND  Final    Special Requests   Final    BOTTLES DRAWN AEROBIC ONLY Blood Culture results may not be optimal due to an inadequate volume of blood received in culture bottles   Culture   Final    NO GROWTH 5 DAYS Performed at [facility], [address], [place], [place]    Report Status 11/30/2020 FINAL  Final  Culture, blood (routine x 2)     Status: None   Collection Time: 11/25/20  3:29 PM   Specimen: BLOOD LEFT HAND  Result Value Ref Range Status   Specimen Description BLOOD LEFT HAND  Final   Special Requests   Final    BOTTLES DRAWN AEROBIC AND ANAEROBIC Blood Culture adequate volume   Culture   Final    NO GROWTH 5 DAYS Performed at [facility], [address] [place], [place]    Report Status 11/30/2020 FINAL  Final  Culture, Respiratory w Gram Stain     Status: None   Collection Time: 12/06/20 10:40 AM   Specimen: Tracheal Aspirate; Respiratory  Result Value Ref Range Status   Specimen Description TRACHEAL ASPIRATE  Final   Special Requests NONE  Final   Gram Stain   Final    ABUNDANT WBC PRESENT, PREDOMINANTLY PMN ABUNDANT GRAM NEGATIVE RODS FEW GRAM POSITIVE COCCI FEW GRAM POSITIVE  RODS    Culture   Final    MODERATE PSEUDOMONAS AERUGINOSA Two isolates with different morphologies were identified as the same organism.The most resistant organism was reported. Performed at [facility], [address] [place], [place] [address]    Report Status 12/09/2020 FINAL  Final   Organism ID, Bacteria PSEUDOMONAS AERUGINOSA  Final      Susceptibility   Pseudomonas aeruginosa - MIC*    CEFTAZIDIME 16 INTERMEDIATE Intermediate     CIPROFLOXACIN 1 SENSITIVE Sensitive     GENTAMICIN 2 SENSITIVE Sensitive     IMIPENEM >=16 RESISTANT Resistant     * MODERATE PSEUDOMONAS AERUGINOSA  Culture, blood (routine x 2)     Status: None   Collection Time: 12/13/20  9:09 AM   Specimen: BLOOD LEFT HAND  Result Value Ref Range Status   Specimen Description  BLOOD LEFT HAND  Final   Special Requests   Final    BOTTLES DRAWN AEROBIC AND ANAEROBIC Blood Culture results may not be optimal due to an inadequate volume of blood received in culture bottles   Culture   Final    NO GROWTH 5 DAYS Performed at [facility], [address] [place], [place] [address]    Report Status 12/18/2020 FINAL  Final  Culture, blood (routine x 2)     Status: Abnormal   Collection Time: 12/13/20  9:13 AM   Specimen: BLOOD  Result Value Ref Range Status   Specimen Description BLOOD RIGHT ANTECUBITAL  Final   Special Requests   Final    BOTTLES DRAWN AEROBIC AND ANAEROBIC Blood Culture adequate volume   Culture  Setup Time   Final    GRAM POSITIVE COCCI IN CLUSTERS AEROBIC BOTTLE ONLY CRITICAL RESULT CALLED TO, READ BACK BY AND VERIFIED WITH: RN C.ROBENSON AT 1234 ON 12/14/2020 BY T.SAAD.    Culture (A)  Final    STAPHYLOCOCCUS EPIDERMIDIS THE SIGNIFICANCE OF ISOLATING THIS ORGANISM FROM A SINGLE SET OF BLOOD CULTURES WHEN MULTIPLE SETS ARE DRAWN IS UNCERTAIN. PLEASE NOTIFY THE MICROBIOLOGY DEPARTMENT WITHIN ONE WEEK IF SPECIATION AND SENSITIVITIES ARE REQUIRED. Performed at [facility], [address] [place], [place] [address]    Report Status 12/16/2020 FINAL  Final  Blood Culture ID Panel (Reflexed)     Status: Abnormal   Collection Time: 12/13/20  9:13 AM  Result Value Ref Range Status   Enterococcus faecalis NOT DETECTED NOT DETECTED Final   Enterococcus Faecium NOT DETECTED NOT DETECTED Final   Listeria monocytogenes NOT DETECTED NOT DETECTED Final   Staphylococcus species DETECTED (A) NOT DETECTED Final    Comment: CRITICAL RESULT CALLED TO, READ BACK BY AND VERIFIED WITH: RN C.ROBERTSON AT 1234 ON 12/14/2020 BY T.SAAD.    Staphylococcus aureus (BCID) NOT DETECTED NOT DETECTED Final   Staphylococcus epidermidis DETECTED (A) NOT DETECTED Final    Comment: Methicillin (oxacillin) resistant coagulase negative staphylococcus. Possible  blood culture contaminant (unless isolated from more than one blood culture draw or clinical case suggests pathogenicity). No antibiotic treatment is indicated for blood  culture contaminants. CRITICAL RESULT CALLED TO, READ BACK BY AND VERIFIED WITH: RN C.ROBERTSON AT 1234 ON 12/14/2020 BY T.SAAD.    Staphylococcus lugdunensis NOT DETECTED NOT DETECTED Final   Streptococcus species NOT DETECTED NOT DETECTED Final   Streptococcus agalactiae NOT DETECTED NOT DETECTED Final   Streptococcus pneumoniae NOT DETECTED NOT DETECTED Final   Streptococcus pyogenes NOT DETECTED NOT DETECTED Final   A.calcoaceticus-baumannii NOT DETECTED NOT DETECTED  Final   Bacteroides fragilis NOT DETECTED NOT DETECTED Final   Enterobacterales NOT DETECTED NOT DETECTED Final   Enterobacter cloacae complex NOT DETECTED NOT DETECTED Final   Escherichia coli NOT DETECTED NOT DETECTED Final   Klebsiella aerogenes NOT DETECTED NOT DETECTED Final   Klebsiella oxytoca NOT DETECTED NOT DETECTED Final   Klebsiella pneumoniae NOT DETECTED NOT DETECTED Final   Proteus species NOT DETECTED NOT DETECTED Final   Salmonella species NOT DETECTED NOT DETECTED Final   Serratia marcescens NOT DETECTED NOT DETECTED Final   Haemophilus influenzae NOT DETECTED NOT DETECTED Final   Neisseria meningitidis NOT DETECTED NOT DETECTED Final   Pseudomonas aeruginosa NOT DETECTED NOT DETECTED Final   Stenotrophomonas maltophilia NOT DETECTED NOT DETECTED Final   Candida albicans NOT DETECTED NOT DETECTED Final   Candida auris NOT DETECTED NOT DETECTED Final   Candida glabrata NOT DETECTED NOT DETECTED Final   Candida krusei NOT DETECTED NOT DETECTED Final   Candida parapsilosis NOT DETECTED NOT DETECTED Final   Candida tropicalis NOT DETECTED NOT DETECTED Final   Cryptococcus neoformans/gattii NOT DETECTED NOT DETECTED Final   Methicillin resistance mecA/C DETECTED (A) NOT DETECTED Final    Comment: CRITICAL RESULT CALLED TO, READ BACK BY  AND VERIFIED WITH: RN C.ROBERTSON AT 1234 ON 12/14/2020 BY T.SAAD. Performed at [facility], [address], [place], [place] [address]   Culture, blood (routine x 2)     Status: None   Collection Time: 12/17/20 11:31 AM   Specimen: BLOOD  Result Value Ref Range Status   Specimen Description BLOOD RIGHT ANTECUBITAL  Final   Special Requests   Final    BOTTLES DRAWN AEROBIC ONLY Blood Culture results may not be optimal due to an inadequate volume of blood received in culture bottles   Culture   Final    NO GROWTH 5 DAYS Performed at [facility], [address] [place], [place] [address]    Report Status 12/22/2020 FINAL  Final  Culture, blood (routine x 2)     Status: None   Collection Time: 12/17/20 11:38 AM   Specimen: BLOOD LEFT HAND  Result Value Ref Range Status   Specimen Description BLOOD LEFT HAND  Final   Special Requests   Final    BOTTLES DRAWN AEROBIC ONLY Blood Culture adequate volume   Culture   Final    NO GROWTH 5 DAYS Performed at [facility], [address], [place], [place] [address]    Report Status 12/22/2020 FINAL  Final  Culture, blood (routine x 2)     Status: None   Collection Time: 12/29/20  8:10 AM   Specimen: BLOOD RIGHT HAND  Result Value Ref Range Status   Specimen Description BLOOD RIGHT HAND  Final   Special Requests   Final    BOTTLES DRAWN AEROBIC AND ANAEROBIC Blood Culture results may not be optimal due to an inadequate volume of blood received in culture bottles   Culture   Final    NO GROWTH 5 DAYS Performed at [facility], [address] [place], [place] [address]    Report Status 01/03/2021 FINAL  Final  Culture, blood (routine x 2)     Status: None   Collection Time: 12/29/20  8:17 AM   Specimen: BLOOD RIGHT HAND  Result Value Ref Range Status   Specimen Description BLOOD RIGHT HAND  Final   Special Requests   Final    BOTTLES DRAWN AEROBIC ONLY Blood  Culture results may not be optimal due to an  inadequate volume of blood received in culture bottles   Culture   Final    NO GROWTH 5 DAYS Performed at [facility], [address], [place], [place] [address]    Report Status 01/03/2021 FINAL  Final  Culture, Respiratory w Gram Stain     Status: None   Collection Time: 01/01/21 11:54 AM   Specimen: Tracheal Aspirate; Respiratory  Result Value Ref Range Status   Specimen Description TRACHEAL ASPIRATE  Final   Special Requests NONE  Final   Gram Stain   Final    RARE WBC PRESENT,BOTH PMN AND MONONUCLEAR FEW GRAM NEGATIVE RODS Performed at [facility], [address], [place], [place] [address]    Culture MODERATE PSEUDOMONAS AERUGINOSA  Final   Report Status 01/03/2021 FINAL  Final   Organism ID, Bacteria PSEUDOMONAS AERUGINOSA  Final      Susceptibility   Pseudomonas aeruginosa - MIC*    CEFTAZIDIME 4 SENSITIVE Sensitive     CIPROFLOXACIN 1 SENSITIVE Sensitive     GENTAMICIN <=1 SENSITIVE Sensitive     IMIPENEM 2 SENSITIVE Sensitive     PIP/TAZO <=4 SENSITIVE Sensitive     CEFEPIME 2 SENSITIVE Sensitive     * MODERATE PSEUDOMONAS AERUGINOSA  Culture, blood (routine x 2)     Status: None (Preliminary result)   Collection Time: 01/04/21 11:02 AM   Specimen: BLOOD RIGHT HAND  Result Value Ref Range Status   Specimen Description BLOOD RIGHT HAND  Final   Special Requests   Final    BOTTLES DRAWN AEROBIC AND ANAEROBIC Blood Culture adequate volume   Culture   Final    NO GROWTH < 24 HOURS Performed at [facility], [address] [place], [place] [address]    Report Status PENDING  Incomplete  Culture, blood (routine x 2)     Status: None (Preliminary result)   Collection Time: 01/04/21 11:11 AM   Specimen: BLOOD RIGHT FOREARM  Result Value Ref Range Status   Specimen Description BLOOD RIGHT FOREARM  Final   Special Requests   Final    BOTTLES DRAWN AEROBIC AND ANAEROBIC Blood Culture results may not be optimal due to an inadequate volume of blood  received in culture bottles   Culture   Final    NO GROWTH < 24 HOURS Performed at [facility], [address], [place], [place] [address]    Report Status PENDING  Incomplete    Coagulation Studies: No results for input(s): LABPROT, INR in the last 72 hours.  Urinalysis: No results for input(s): COLORURINE, LABSPEC, PHURINE, GLUCOSEU, HGBUR, BILIRUBINUR, KETONESUR, PROTEINUR, UROBILINOGEN, NITRITE, LEUKOCYTESUR in the last 72 hours.  Invalid input(s): APPERANCEUR    Imaging: DG CHEST PORT 1 VIEW  Result Date: 01/04/2021 CLINICAL DATA:  Fever. EXAM: PORTABLE CHEST 1 VIEW COMPARISON:  12/30/2020 FINDINGS: Tracheostomy tube overlies the airway. Bilateral jugular catheters remain in place with their distal aspects partially obscured by telemetry leads. The cardiac silhouette is upper limits of normal in size. There is persistent pulmonary vascular congestion without overt edema. Minimal right basilar opacity likely reflects atelectasis. No sizable pleural effusion or pneumothorax is identified. IMPRESSION: Persistent pulmonary vascular congestion. Electronically Signed   By: Sebastian Ache M.D.   On: 01/04/2021 10:56     Medications:       Assessment/ Plan:  65 y.o. male with a PMHx of ESRD on HD TTS, anemia of  chronic kidney disease, hypertension, acute respiratory failure status post tracheostomy placement, seizure disorder, hyperlipidemia, diabetes mellitus type 2, history of hyperkalemia, history of coronary artery disease, peripheral vascular disease who was admitted to [facility] on 11/21/2020 for ongoing treatment of acute respiratory failure status post tracheostomy placement and ESRD.   1.  ESRD on HD.    Patient due for usual dialysis treatment today.  Orders have been prepared.  2.  Acute respiratory failure.  Continue current respiratory support.  Has tracheostomy in place with T-piece.  Having secretions from the T-piece.  3.  Anemia of chronic kidney disease.   Hemoglobin up to 8.2 posttransfusion.  Continue Retacrit.  4.  Secondary hyperparathyroidism.  Phosphorus currently 1.7.  Administer sodium phosphorus 30 mmol IV x1 today.  5.  Fever.  Respiratory culture showing Pseudomonas.  Blood cultures retaken and thus far negative.  Antibiotic treatment and regimen per hospitalist.  Consider ID consultation.  Defer to primary team in regards to this.   LOS: 0 Ocie Stanzione 6/29/20228:34 AM

## 2021-01-05 NOTE — Progress Notes (Signed)
Pulmonary Critical Care Medicine [facility] [place]   PULMONARY CRITICAL CARE SERVICE  PROGRESS NOTE     Joel Hebert  CWC:[id number]  DOB: [birth year]   DOA: 11/21/2020  Referring Physician: Carron Curie, MD  HPI: Joel Hebert is a 65 y.o. male being followed for ventilator/airway/oxygen weaning Acute on Chronic Respiratory Failure.  Patient is currently on T collar baseline remains nonverbal nonresponsive  Medications: Reviewed on Rounds  Physical Exam:  Vitals: Temperature is 102.2 pulse 89 respiratory 30 blood pressure is 103/50 saturations 94%  Ventilator Settings patient currently is off the ventilator on T collar  General: Comfortable at this time Neck: supple Cardiovascular: no malignant arrhythmias Respiratory: No rhonchi no rales noted Skin: no rash seen on limited exam Musculoskeletal: No gross abnormality Psychiatric:unable to assess Neurologic:no involuntary movements         Lab Data:   Basic Metabolic Panel: Recent Labs  Lab 12/31/20 0641 01/03/21 0635 01/05/21 0550  NA 131* 129* 131*  K 3.2* 2.8* 3.3*  CL 96* 92* 94*  CO2 26 25 23   GLUCOSE 176* 171* 156*  BUN 31* 122* 81*  CREATININE 1.69* 3.90* 2.94*  CALCIUM 9.0 9.6 8.9  PHOS 1.3* 1.9* 1.7*    ABG: No results for input(s): PHART, PCO2ART, PO2ART, HCO3, O2SAT in the last 168 hours.  Liver Function Tests: Recent Labs  Lab 12/31/20 0641 01/03/21 0635 01/05/21 0550  ALBUMIN 2.2* 2.0* 2.0*   No results for input(s): LIPASE, AMYLASE in the last 168 hours. No results for input(s): AMMONIA in the last 168 hours.  CBC: Recent Labs  Lab 12/31/20 0641 01/03/21 0635 01/03/21 1639 01/05/21 0550  WBC 17.6* 11.7* 13.5* 13.7*  HGB 7.8* 6.4* 7.3* 8.2*  HCT 24.7* 20.1* 21.6* 24.3*  MCV 97.2 97.1 92.7 93.5  PLT 330 350 326 294    Cardiac Enzymes: No results for input(s): CKTOTAL, CKMB, CKMBINDEX, TROPONINI in the last 168 hours.  BNP (last 3 results) No  results for input(s): BNP in the last 8760 hours.  ProBNP (last 3 results) No results for input(s): PROBNP in the last 8760 hours.  Radiological Exams: DG CHEST PORT 1 VIEW  Result Date: 01/04/2021 CLINICAL DATA:  Fever. EXAM: PORTABLE CHEST 1 VIEW COMPARISON:  12/30/2020 FINDINGS: Tracheostomy tube overlies the airway. Bilateral jugular catheters remain in place with their distal aspects partially obscured by telemetry leads. The cardiac silhouette is upper limits of normal in size. There is persistent pulmonary vascular congestion without overt edema. Minimal right basilar opacity likely reflects atelectasis. No sizable pleural effusion or pneumothorax is identified. IMPRESSION: Persistent pulmonary vascular congestion. Electronically Signed   By: 01/01/2021 M.D.   On: 01/04/2021 10:56    Assessment/Plan Active Problems:   Acute on chronic respiratory failure with hypoxia (HCC)   Anoxic brain injury (HCC)   Acute renal failure superimposed on chronic kidney disease (HCC)   Pneumonia due to Pseudomonas (HCC)   Acute on chronic respiratory failure hypoxia plan is to continue with the T-piece titrate oxygen as tolerated continue pulmonary toilet supportive care. Anoxic brain injury no change we will continue with supportive care Acute renal failure on chronic renal failure patient is dialysis dependent being seen by nephrology. Pneumonia due to Pseudomonas no change we will continue with supportive care    I have personally seen and evaluated the patient, evaluated laboratory and imaging results, formulated the assessment and plan and placed orders. The Patient requires high complexity decision making with multiple systems involvement.  Rounds were done with the Respiratory Therapy Director and Staff therapists and discussed with nursing staff also.  Allyne Gee, MD [facility] Pulmonary Critical Care Medicine Sleep Medicine

## 2021-01-06 NOTE — Consult Note (Signed)
Infectious Diease Consultation   Bradley Bostelman  RRN:[id number]  DOB: 26-Dec-1955  DOA: 11/21/2020  Requesting physician: Dr. Sharyon Medicus  Reason for consultation: Antibiotic Recommendations   History of Present Illness: Kobee Medlen is an 65 y.o. male with medical history significant of peripheral artery disease, left BKA in 2019, recent right TMA in March 2022, diabetes mellitus, coronary artery disease status post PCI in 2015, chronic kidney disease stage IV, hypertension who presented to the emergency room in [place] from [facility] nursing home for altered mental status on 10/19/2020.  In the emergency room patient apparently was found to be agitated and combative, not following any commands.  He was recently admitted in the nursing home after being discharged from [facility] with right foot pain on 08/22/2020 with decreased circulation and underwent procedure to open his arteries on 09/03/2020.  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  He was transferred to the nursing home on cefepime 2 g every 8 hours with stop date on 10/23/2020.  He was admitted to the outside facility for altered mental status thought to be secondary to hypertensive encephalopathy with hypertensive emergency.  He was started on Cleviprex drip.  His troponin was elevated and he was started on heparin drip.  On 10/19/2020 rapid response was called for altered mental status and desaturation.  He had to be intubated due to worsening respiratory distress.  He was seen by nephrology for worsening azotemia during his hospital stay and was started on hemodialysis.  He was found to have heme positive stool and was transfused 1 unit PRBCs on 10/23/2020.  His blood pressures remained elevated.  He was started on isosorbide dinitrate drip.  On 10/27/2020 patient apparently self extubated, was transitioned to BiPAP.  However, he had a cord  of unknown downtime with return of spontaneous circulation after 5 rounds of compressions, epinephrine x2.  He was found to be actively seizing.  He was seen by neurology and started on valproic acid, Klonopin, fosphenytoin IV, Vimpat, phenobarbital.  He also received treatment with antibiotics with ceftazidime and IV Bactrim for pneumonia.  His PCR sputum culture was positive for Pseudomonas and Klebsiella which was Carbapenem resistant.  Patient also received treatment with inhaled tobramycin.  Due to his complex medical problems he was transferred and admitted to [facility].  Patient continuing to have fever.  Blood pressure on the lower side.  He has HD catheter in place.  Blood cultures from 01/04/2021 showing staph epidermidis.  He also had blood cultures from 12/13/2020 which showed staph epidermidis.  Review of Systems:  He is nonverbal.  Unable to obtain review of systems at this time.  Past Medical History: Past Medical History:  Diagnosis Date   Acute on chronic respiratory failure with hypoxia (HCC)    Acute renal failure superimposed on chronic kidney disease (HCC)    Anoxic brain injury (HCC)    Coronary artery disease    High cholesterol    Hypertension    MI (myocardial infarction) (HCC) 08/13/2014; 01/2016- 03/2016 X 3   PAD (peripheral artery disease) (HCC)    Peripheral vascular disease (HCC)    Pneumonia due to Pseudomonas (HCC)    Type II diabetes mellitus (HCC)   History of COVID-19 infection, chronic vitamin D deficiency, CKD stage IV  Past Surgical History: Past Surgical History:  Procedure Laterality Date  ABDOMINAL AORTOGRAM W/LOWER EXTREMITY  08/14/2017   ABDOMINAL AORTOGRAM W/LOWER EXTREMITY N/A 08/14/2017   Procedure: ABDOMINAL AORTOGRAM W/LOWER EXTREMITY;  Surgeon: Nada LibmanBrabham, Vance W, MD;  [facility];  Service: Cardiovascular;  Laterality: N/A;   ABDOMINAL AORTOGRAM W/LOWER EXTREMITY N/A 09/05/2017   Procedure: ABDOMINAL AORTOGRAM  W/LOWER EXTREMITY;  Surgeon: Maeola Harmanain, Brandon Christopher, MD;  [facility];  Service: Cardiovascular;  Laterality: N/A;   AMPUTATION Left 10/09/2017   Procedure: AMPUTATION DIGIT LEFT FIFTH TOE;  Surgeon: Maeola Harmanain, Brandon Christopher, MD;  [facility];  Service: Vascular;  Laterality: Left;   AMPUTATION Left 12/14/2017   Procedure: LEFT TRANSMETATARSAL AMPUTATION;  Surgeon: Nadara Mustarduda, Marcus V, MD;  [facility];  Service: Orthopedics;  Laterality: Left;   AMPUTATION Left 01/04/2018   Procedure: LEFT BELOW KNEE AMPUTATION;  Surgeon: Nadara Mustarduda, Marcus V, MD;  [facility];  Service: Orthopedics;  Laterality: Left;   AMPUTATION TOE Left 10/09/2017   left foot 5th toe   CORONARY ANGIOPLASTY WITH STENT PLACEMENT  01/2016; 03/2016   "1 + 1" (08/14/2017)   IR FLUORO GUIDE CV LINE RIGHT  01/07/2018   IR US GUIDE VASC ACCESS RIGHT  01/07/2018   LOWER EXTREMITY ANGIOGRAPHY N/A 08/20/2017   Procedure: LOWER EXTREMITY ANGIOGRAPHY-rt leg;  Surgeon: Sherren KernsFields, Charles E, MD;  [facility];  Service: Cardiovascular;  Laterality: N/A;   PERIPHERAL VASCULAR ATHERECTOMY  08/14/2017   Procedure: PERIPHERAL VASCULAR ATHERECTOMY;  Surgeon: Nada LibmanBrabham, Vance W, MD;  [facility];  Service: Cardiovascular;;  SFA    PERIPHERAL VASCULAR ATHERECTOMY Left 09/05/2017   Procedure: PERIPHERAL VASCULAR ATHERECTOMY;  Surgeon: Maeola Harmanain, Brandon Christopher, MD;  [facility];  Service: Cardiovascular;  Laterality: Left;  Tibioperoneal and posterior tibial   PERIPHERAL VASCULAR BALLOON ANGIOPLASTY  08/14/2017   Procedure: PERIPHERAL VASCULAR BALLOON ANGIOPLASTY;  Surgeon: Nada LibmanBrabham, Vance W, MD;  [facility];  Service: Cardiovascular;;  peroneal   PERIPHERAL VASCULAR BALLOON ANGIOPLASTY  08/20/2017   Procedure: PERIPHERAL VASCULAR BALLOON ANGIOPLASTY;  Surgeon: Sherren KernsFields, Charles E, MD;  [facility];  Service: Cardiovascular;;   PERIPHERAL VASCULAR INTERVENTION   08/14/2017   Procedure: PERIPHERAL VASCULAR INTERVENTION;  Surgeon: Nada LibmanBrabham, Vance W, MD;  [facility];  Service: Cardiovascular;;  SFA stent   TONSILLECTOMY     WOUND DEBRIDEMENT Left 11/06/2017   Procedure: DEBRIDEMENT WOUND TOE BED OF FIFTH TOE LEFT FOOT;  Surgeon: Maeola Harmanain, Brandon Christopher, MD;  [facility];  Service: Vascular;  Laterality: Left;     Allergies:  No Known Allergies   Social History: Per records reports that he has been smoking cigarettes. He started smoking about 46 years ago. He has a 10.75 pack-year smoking history. He has never used smokeless tobacco. He reports previous alcohol use. He reports that he does not use drugs.   Family History: Family History  Problem Relation Age of Onset   Heart failure Brother    Heart disease Brother    Kidney disease Mother    Heart disease Father    Physical Exam: Vitals: Temperature 100.4, pulse 95, respiratory rate 19, blood pressure 114/51, pulse oximetry 99%  Constitutional: Ill-appearing male, not following commands at this time. Head: Atraumatic, normocephalic Eyes: PERLA  ENMT: external ears and nose appear normal, Lips appears normal, moist oral mucosa  Neck: Has trach in place CVS: S1-S2  Respiratory: Coarse breath sounds with rhonchi  Abdomen: Obese, soft, positive bowel sounds  Musculoskeletal: Left  BKA with DuoDERM, right foot with dressing in place  Neuro: He is not following any commands at this time.  Unable to do neurologic exam. Psych: Unable to assess at this time  skin: no rashes  Data reviewed:  I have personally reviewed following labs and imaging studies Labs:  CBC: Recent Labs  Lab 12/31/20 0641 01/03/21 0635 01/03/21 1639 01/05/21 0550  WBC 17.6* 11.7* 13.5* 13.7*  HGB 7.8* 6.4* 7.3* 8.2*  HCT 24.7* 20.1* 21.6* 24.3*  MCV 97.2 97.1 92.7 93.5  PLT 330 350 326 294    Basic Metabolic Panel: Recent Labs  Lab 12/31/20 0641 01/03/21 0635 01/05/21 0550  NA 131*  129* 131*  K 3.2* 2.8* 3.3*  CL 96* 92* 94*  CO2 26 25 23   GLUCOSE 176* 171* 156*  BUN 31* 122* 81*  CREATININE 1.69* 3.90* 2.94*  CALCIUM 9.0 9.6 8.9  PHOS 1.3* 1.9* 1.7*   GFR CrCl cannot be calculated (Unknown ideal weight.). Liver Function Tests: Recent Labs  Lab 12/31/20 0641 01/03/21 0635 01/05/21 0550  ALBUMIN 2.2* 2.0* 2.0*   No results for input(s): LIPASE, AMYLASE in the last 168 hours. No results for input(s): AMMONIA in the last 168 hours. Coagulation profile No results for input(s): INR, PROTIME in the last 168 hours.  Cardiac Enzymes: No results for input(s): CKTOTAL, CKMB, CKMBINDEX, TROPONINI in the last 168 hours. BNP: Invalid input(s): POCBNP CBG: No results for input(s): GLUCAP in the last 168 hours. D-Dimer No results for input(s): DDIMER in the last 72 hours. Hgb A1c No results for input(s): HGBA1C in the last 72 hours. Lipid Profile No results for input(s): CHOL, HDL, LDLCALC, TRIG, CHOLHDL, LDLDIRECT in the last 72 hours. Thyroid function studies No results for input(s): TSH, T4TOTAL, T3FREE, THYROIDAB in the last 72 hours.  Invalid input(s): FREET3 Anemia work up No results for input(s): VITAMINB12, FOLATE, FERRITIN, TIBC, IRON, RETICCTPCT in the last 72 hours. Urinalysis    Component Value Date/Time   COLORURINE AMBER (A) 01/04/2018 1645   APPEARANCEUR TURBID (A) 01/04/2018 1645   LABSPEC 1.018 01/04/2018 1645   PHURINE 5.0 01/04/2018 1645   GLUCOSEU NEGATIVE 01/04/2018 1645   HGBUR SMALL (A) 01/04/2018 1645   BILIRUBINUR NEGATIVE 01/04/2018 1645   KETONESUR NEGATIVE 01/04/2018 1645   PROTEINUR 100 (A) 01/04/2018 1645   NITRITE NEGATIVE 01/04/2018 1645   LEUKOCYTESUR TRACE (A) 01/04/2018 1645     Sepsis Labs Invalid input(s): PROCALCITONIN,  WBC,  LACTICIDVEN Microbiology Recent Results (from the past 240 hour(s))  Culture, blood (routine x 2)     Status: None   Collection Time: 12/29/20  8:10 AM   Specimen: BLOOD RIGHT HAND   Result Value Ref Range Status   Specimen Description BLOOD RIGHT HAND  Final   Special Requests   Final    BOTTLES DRAWN AEROBIC AND ANAEROBIC Blood Culture results may not be optimal due to an inadequate volume of blood received in culture bottles   Culture   Final    NO GROWTH 5 DAYS Performed at [facility] Lab, [address] [place], [place]    Report Status 01/03/2021 FINAL  Final  Culture, blood (routine x 2)     Status: None   Collection Time: 12/29/20  8:17 AM   Specimen: BLOOD RIGHT HAND  Result Value Ref Range Status   Specimen Description BLOOD RIGHT HAND  Final   Special Requests   Final    BOTTLES DRAWN AEROBIC ONLY Blood Culture results may not be  optimal due to an inadequate volume of blood received in culture bottles   Culture   Final    NO GROWTH 5 DAYS Performed at [facility], [address], [place], [place] [address]    Report Status 01/03/2021 FINAL  Final  Culture, Respiratory w Gram Stain     Status: None   Collection Time: 01/01/21 11:54 AM   Specimen: Tracheal Aspirate; Respiratory  Result Value Ref Range Status   Specimen Description TRACHEAL ASPIRATE  Final   Special Requests NONE  Final   Gram Stain   Final    RARE WBC PRESENT,BOTH PMN AND MONONUCLEAR FEW GRAM NEGATIVE RODS Performed at [facility], [address] [place], [place] [address]    Culture MODERATE PSEUDOMONAS AERUGINOSA  Final   Report Status 01/03/2021 FINAL  Final   Organism ID, Bacteria PSEUDOMONAS AERUGINOSA  Final      Susceptibility   Pseudomonas aeruginosa - MIC*    CEFTAZIDIME 4 SENSITIVE Sensitive     CIPROFLOXACIN 1 SENSITIVE Sensitive     GENTAMICIN <=1 SENSITIVE Sensitive     IMIPENEM 2 SENSITIVE Sensitive     PIP/TAZO <=4 SENSITIVE Sensitive     CEFEPIME 2 SENSITIVE Sensitive     * MODERATE PSEUDOMONAS AERUGINOSA  Culture, blood (routine x 2)     Status: None (Preliminary result)   Collection Time: 01/04/21 11:02 AM   Specimen: BLOOD  RIGHT HAND  Result Value Ref Range Status   Specimen Description BLOOD RIGHT HAND  Final   Special Requests   Final    BOTTLES DRAWN AEROBIC AND ANAEROBIC Blood Culture adequate volume   Culture   Final    NO GROWTH 2 DAYS Performed at [facility], [address] [place], [place] [address]    Report Status PENDING  Incomplete  Culture, blood (routine x 2)     Status: Abnormal (Preliminary result)   Collection Time: 01/04/21 11:11 AM   Specimen: BLOOD RIGHT FOREARM  Result Value Ref Range Status   Specimen Description BLOOD RIGHT FOREARM  Final   Special Requests   Final    BOTTLES DRAWN AEROBIC AND ANAEROBIC Blood Culture results may not be optimal due to an inadequate volume of blood received in culture bottles   Culture  Setup Time   Final    GRAM POSITIVE COCCI IN CLUSTERS ANAEROBIC BOTTLE ONLY CRITICAL RESULT CALLED TO, READ BACK BY AND VERIFIED WITH: RN D [place] [id number] AT 1003 BY CM    Culture (A)  Final    STAPHYLOCOCCUS EPIDERMIDIS THE SIGNIFICANCE OF ISOLATING THIS ORGANISM FROM A SINGLE SET OF BLOOD CULTURES WHEN MULTIPLE SETS ARE DRAWN IS UNCERTAIN. PLEASE NOTIFY THE MICROBIOLOGY DEPARTMENT WITHIN ONE WEEK IF SPECIATION AND SENSITIVITIES ARE REQUIRED. Performed at [facility], [address], [place], [place] [address]    Report Status PENDING  Incomplete     Inpatient Medications:   Please see MAR   Radiological Exams on Admission: No results found.  Impression/Recommendations Active Problems:   Acute on chronic respiratory failure with hypoxia (HCC)  Pneumonia due to Pseudomonas (HCC) Fever/leukocytosis Gram-positive bacteremia Systemic inflammatory response syndrome   Anoxic brain injury (HCC)   Acute renal failure superimposed on chronic kidney disease (HCC) Acute on chronic anemia Diabetic foot wound with gangrene status post recent surgery Diabetes mellitus type 2 Dysphagia/protein calorie malnutrition   Acute on chronic  respiratory failure with hypoxemia: Patient currently has tracheostomy in place.  Unfortunately continues to have  increased secretions.  His previous respiratory culture showed Pseudomonas for which he received treatment with cefepime.  Currently high concern for systemic inflammatory response syndrome given the ongoing fevers and leukocytosis.  Started on empiric IV vancomycin, cefepime.  Continue to monitor closely.  If his respiratory status worsens suggest repeat chest imaging preferably chest CT which can be done without contrast due to the acute on chronic renal failure.  Pneumonia: He has had pneumonia with respiratory cultures have previously showed Pseudomonas.  Unfortunately also has dysphagia secondary to anoxic brain injury and high risk for recurrent aspiration and aspiration pneumonia despite being on antibiotics.  Currently started on empiric antibiotics as mentioned above.  Respiratory cultures from 01/01/2021 showing moderate Pseudomonas aeruginosa.  This is susceptible to the cefepime.  Again, as mentioned above he is high risk for worsening respiratory failure due to his aspiration.  If his respiratory status worsens, suggest repeat chest imaging preferably chest CT.  Continue to monitor BUN/creatinine closely.  Adjust antibiotics accordingly. -He has a dialysis catheter in place.  There is concern for ongoing bacteremia.  Blood cultures now twice have shown staph epidermidis.  If he continues to have fevers and worsening leukocytosis we may have no other choice but to remove the HD catheter line and give him a line holiday.  In case the line is removed suggest antibiotics post dialysis cefepime which can be given as 2 g, 2 g, 3 g after dialysis on dialysis days.  Fever/leukocytosis: In the setting of gram-positive bacteremia/systemic inflammatory response syndrome.  He has grown staph epidermidis twice on the blood cultures.  Continue to monitor closely on empiric antibiotics.  However, if he  starts having worsening leukocytosis and continues to have fevers then we may need to consider removing the line and giving him a line holiday.  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  Unfortunately he continues to remain unresponsive.  Continue Vimpat, Dilantin, phenobarbital per primary team.  Acute on chronic kidney disease: Patient on hemodialysis.  Nephrology following.  Antibiotics renally dosed.  Further management per primary team and nephrology.  Diabetic foot infection with gangrene: He is status post right transmetatarsal amputation.  Continue local wound care.  He also has left BKA.  If wound worsening we may need to repeat imaging.  On empiric antibiotics as mentioned above.  Diabetes mellitus: Continue to monitor, medication and management of diabetes per primary team.  Dysphagia/protein calorie malnutrition: Management per the primary team.  Due to his dysphagia he is high risk for ongoing aspiration and recurrent aspiration pneumonia despite being on antibiotics.  Unfortunately due to his multiple complex medical problems he is very high risk for worsening and decompensation.  Plan of care discussed with the primary team and pharmacy.  Thank you for this consultation.    Vonzella Nipple M.D. 01/06/2021, 3:26 PM

## 2021-01-07 ENCOUNTER — Other Ambulatory Visit (HOSPITAL_COMMUNITY): Payer: Medicare Other

## 2021-01-07 DIAGNOSIS — N179 Acute kidney failure, unspecified: Secondary | ICD-10-CM | POA: Diagnosis not present

## 2021-01-07 DIAGNOSIS — J151 Pneumonia due to Pseudomonas: Secondary | ICD-10-CM | POA: Diagnosis not present

## 2021-01-07 DIAGNOSIS — J9621 Acute and chronic respiratory failure with hypoxia: Secondary | ICD-10-CM | POA: Diagnosis not present

## 2021-01-07 DIAGNOSIS — G931 Anoxic brain damage, not elsewhere classified: Secondary | ICD-10-CM | POA: Diagnosis not present

## 2021-01-07 HISTORY — PX: IR REMOVAL TUN CV CATH W/O FL: IMG2289

## 2021-01-07 LAB — RENAL FUNCTION PANEL
Albumin: 1.7 g/dL — ABNORMAL LOW (ref 3.5–5.0)
Anion gap: 11 (ref 5–15)
BUN: 65 mg/dL — ABNORMAL HIGH (ref 8–23)
CO2: 25 mmol/L (ref 22–32)
Calcium: 8.5 mg/dL — ABNORMAL LOW (ref 8.9–10.3)
Chloride: 98 mmol/L (ref 98–111)
Creatinine, Ser: 3.36 mg/dL — ABNORMAL HIGH (ref 0.61–1.24)
GFR, Estimated: 19 mL/min — ABNORMAL LOW (ref >60)
Glucose, Bld: 148 mg/dL — ABNORMAL HIGH (ref 70–99)
Phosphorus: <1 mg/dL — CL (ref 2.5–4.6)
Potassium: 3.2 mmol/L — ABNORMAL LOW (ref 3.5–5.1)
Sodium: 134 mmol/L — ABNORMAL LOW (ref 135–145)

## 2021-01-07 LAB — CULTURE, BLOOD (ROUTINE X 2)

## 2021-01-07 LAB — LEVETIRACETAM LEVEL: Levetiracetam Lvl: 13.7 ug/mL (ref 10.0–40.0)

## 2021-01-07 LAB — CBC
HCT: 20.6 % — ABNORMAL LOW (ref 39.0–52.0)
Hemoglobin: 6.7 g/dL — CL (ref 13.0–17.0)
MCH: 31.2 pg (ref 26.0–34.0)
MCHC: 32.5 g/dL (ref 30.0–36.0)
MCV: 95.8 fL (ref 80.0–100.0)
Platelets: 265 10*3/uL (ref 150–400)
RBC: 2.15 MIL/uL — ABNORMAL LOW (ref 4.22–5.81)
RDW: 18.1 % — ABNORMAL HIGH (ref 11.5–15.5)
WBC: 12.3 10*3/uL — ABNORMAL HIGH (ref 4.0–10.5)
nRBC: 0 % (ref 0.0–0.2)

## 2021-01-07 LAB — PREPARE RBC (CROSSMATCH)

## 2021-01-07 LAB — VANCOMYCIN, TROUGH: Vancomycin Tr: 22 ug/mL (ref 15–20)

## 2021-01-07 MED ORDER — LIDOCAINE HCL (PF) 1 % IJ SOLN
INTRAMUSCULAR | Status: DC | PRN
  Administered 2021-01-07: 10 mL

## 2021-01-07 NOTE — Progress Notes (Signed)
Pulmonary Critical Care Medicine [facility]   PULMONARY CRITICAL CARE SERVICE  PROGRESS NOTE     Joel Hebert  OVF:[id number]  DOB: October 16, 1955   DOA: 11/21/2020  Referring Physician: Carron Curie, MD  HPI: Joel Hebert is a 65 y.o. male being followed for ventilator/airway/oxygen weaning Acute on Chronic Respiratory Failure.  Patient is currently comfortable without distress has been on T collar on 28% FiO2 secretions are still copious  Medications: Reviewed on Rounds  Physical Exam:  Vitals: Temperature is 9100.3 pulse 91 respiratory 35 blood pressure is 154/73 saturations 100  Ventilator Settings on T collar with an FiO2 of 28%  General: Comfortable at this time Neck: supple Cardiovascular: no malignant arrhythmias Respiratory: Scattered rhonchi expansion is equal at this time Skin: no rash seen on limited exam Musculoskeletal: No gross abnormality Psychiatric:unable to assess Neurologic:no involuntary movements         Lab Data:   Basic Metabolic Panel: Recent Labs  Lab 01/03/21 0635 01/05/21 0550 01/07/21 0548  NA 129* 131* 134*  K 2.8* 3.3* 3.2*  CL 92* 94* 98  CO2 25 23 25   GLUCOSE 171* 156* 148*  BUN 122* 81* 65*  CREATININE 3.90* 2.94* 3.36*  CALCIUM 9.6 8.9 8.5*  PHOS 1.9* 1.7* <1.0*    ABG: No results for input(s): PHART, PCO2ART, PO2ART, HCO3, O2SAT in the last 168 hours.  Liver Function Tests: Recent Labs  Lab 01/03/21 0635 01/05/21 0550 01/07/21 0548  ALBUMIN 2.0* 2.0* 1.7*   No results for input(s): LIPASE, AMYLASE in the last 168 hours. No results for input(s): AMMONIA in the last 168 hours.  CBC: Recent Labs  Lab 01/03/21 0635 01/03/21 1639 01/05/21 0550 01/07/21 0548  WBC 11.7* 13.5* 13.7* 12.3*  HGB 6.4* 7.3* 8.2* 6.7*  HCT 20.1* 21.6* 24.3* 20.6*  MCV 97.1 92.7 93.5 95.8  PLT 350 326 294 265    Cardiac Enzymes: No results for input(s): CKTOTAL, CKMB, CKMBINDEX, TROPONINI in the  last 168 hours.  BNP (last 3 results) No results for input(s): BNP in the last 8760 hours.  ProBNP (last 3 results) No results for input(s): PROBNP in the last 8760 hours.  Radiological Exams: No results found.  Assessment/Plan Active Problems:   Acute on chronic respiratory failure with hypoxia (HCC)   Anoxic brain injury (HCC)   Acute renal failure superimposed on chronic kidney disease (HCC)   Pneumonia due to Pseudomonas (HCC)   Acute on chronic respiratory failure with hypoxia plan is to continue on the T-piece patient secretions are copious. Anoxic brain injury no change we will continue to follow along closely Acute renal failure on chronic renal disease continue with supportive care Pseudomonas pneumonia no change we will continue to follow along   I have personally seen and evaluated the patient, evaluated laboratory and imaging results, formulated the assessment and plan and placed orders. The Patient requires high complexity decision making with multiple systems involvement.  Rounds were done with the Respiratory Therapy Director and Staff therapists and discussed with nursing staff also.  03/10/21, MD [facility] Pulmonary Critical Care Medicine Sleep Medicine

## 2021-01-07 NOTE — Progress Notes (Signed)
[facility]  ROUNDING NOTE   Subjective:  Patient continues to have periodic fevers.  Due for hemodialysis today. Currently awake and having myoclonic facial twitches.   Objective:  Vital signs in last 24 hours:  Temperature 102.2 pulse 89 respirations 30 blood pressure 103/50  Physical Exam: General:  No acute distress  Head:  Normocephalic, atraumatic. 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  trace right lower extremity edema  Neurologic:  Awake, myoclonic twitches in the face  Skin:  No acute rash  Access:  Left IJ PermCath    Basic Metabolic Panel: Recent Labs  Lab 01/03/21 0635 01/05/21 0550 01/07/21 0548  NA 129* 131* 134*  K 2.8* 3.3* 3.2*  CL 92* 94* 98  CO2 GLUCOSE 171* 156* 148*  BUN 122* 81* 65*  CREATININE 3.90* 2.94* 3.36*  CALCIUM 9.6 8.9 8.5*  PHOS 1.9* 1.7* <1.0*     Liver Function Tests: Recent Labs  Lab 01/03/21 0635 01/05/21 0550 01/07/21 0548  ALBUMIN 2.0* 2.0* 1.7*    No results for input(s): LIPASE, AMYLASE in the last 168 hours. No results for input(s): AMMONIA in the last 168 hours.  CBC: Recent Labs  Lab 01/03/21 0635 01/03/21 1639 01/05/21 0550 01/07/21 0548  WBC 11.7* 13.5* 13.7* 12.3*  HGB 6.4* 7.3* 8.2* 6.7*  HCT 20.1* 21.6* 24.3* 20.6*  MCV 97.1 92.7 93.5 95.8  PLT 350 326 294 265     Cardiac Enzymes: No results for input(s): CKTOTAL, CKMB, CKMBINDEX, TROPONINI in the last 168 hours.  BNP: Invalid input(s): POCBNP  CBG: No results for input(s): GLUCAP in the last 168 hours.  Microbiology: Results for orders placed or performed during the hospital encounter of 11/21/20  Culture, blood (routine x 2)     Status: None   Collection Time: 11/25/20  3:25 PM   Specimen: BLOOD RIGHT HAND  Result Value Ref Range  Status   Specimen Description BLOOD RIGHT HAND  Final   Special Requests   Final    BOTTLES DRAWN AEROBIC ONLY Blood Culture results may not be optimal due to an inadequate volume of blood received in culture bottles   Culture   Final    NO GROWTH 5 DAYS Performed at [facility], [address], [place], [place] [address]    Report Status 11/30/2020 FINAL  Final  Culture, blood (routine x 2)     Status: None   Collection Time: 11/25/20  3:29 PM   Specimen: BLOOD LEFT HAND  Result Value Ref Range Status   Specimen Description BLOOD LEFT HAND  Final   Special Requests   Final    BOTTLES DRAWN AEROBIC AND ANAEROBIC Blood Culture adequate volume   Culture   Final    NO GROWTH 5 DAYS Performed at [facility], [address] [place], [place] [address]    Report Status 11/30/2020 FINAL  Final  Culture, Respiratory w Gram Stain     Status: None   Collection Time: 12/06/20 10:40 AM   Specimen: Tracheal Aspirate; Respiratory  Result Value Ref Range Status   Specimen Description TRACHEAL ASPIRATE  Final   Special Requests NONE  Final   Gram Stain   Final    ABUNDANT WBC PRESENT, PREDOMINANTLY PMN ABUNDANT GRAM NEGATIVE RODS  FEW GRAM POSITIVE COCCI FEW GRAM POSITIVE RODS    Culture   Final    MODERATE PSEUDOMONAS AERUGINOSA Two isolates with different morphologies were identified as the same organism.The most resistant organism was reported. Performed at [facility], [address], [place], [place] [address]    Report Status 12/09/2020 FINAL  Final   Organism ID, Bacteria PSEUDOMONAS AERUGINOSA  Final      Susceptibility   Pseudomonas aeruginosa - MIC*    CEFTAZIDIME 16 INTERMEDIATE Intermediate     CIPROFLOXACIN 1 SENSITIVE Sensitive     GENTAMICIN 2 SENSITIVE Sensitive     IMIPENEM >=16 RESISTANT Resistant     * MODERATE PSEUDOMONAS AERUGINOSA  Culture, blood (routine x 2)     Status: None   Collection Time: 12/13/20  9:09 AM   Specimen: BLOOD LEFT HAND   Result Value Ref Range Status   Specimen Description BLOOD LEFT HAND  Final   Special Requests   Final    BOTTLES DRAWN AEROBIC AND ANAEROBIC Blood Culture results may not be optimal due to an inadequate volume of blood received in culture bottles   Culture   Final    NO GROWTH 5 DAYS Performed at [facility], [address], [place], [place] [address]    Report Status 12/18/2020 FINAL  Final  Culture, blood (routine x 2)     Status: Abnormal   Collection Time: 12/13/20  9:13 AM   Specimen: BLOOD  Result Value Ref Range Status   Specimen Description BLOOD RIGHT ANTECUBITAL  Final   Special Requests   Final    BOTTLES DRAWN AEROBIC AND ANAEROBIC Blood Culture adequate volume   Culture  Setup Time   Final    GRAM POSITIVE COCCI IN CLUSTERS AEROBIC BOTTLE ONLY CRITICAL RESULT CALLED TO, READ BACK BY AND VERIFIED WITH: RN C.ROBENSON AT 1234 ON 12/14/2020 BY T.SAAD.    Culture (A)  Final    STAPHYLOCOCCUS EPIDERMIDIS THE SIGNIFICANCE OF ISOLATING THIS ORGANISM FROM A SINGLE SET OF BLOOD CULTURES WHEN MULTIPLE SETS ARE DRAWN IS UNCERTAIN. PLEASE NOTIFY THE MICROBIOLOGY DEPARTMENT WITHIN ONE WEEK IF SPECIATION AND SENSITIVITIES ARE REQUIRED. Performed at [facility], [address] [place], [place] [address]    Report Status 12/16/2020 FINAL  Final  Blood Culture ID Panel (Reflexed)     Status: Abnormal   Collection Time: 12/13/20  9:13 AM  Result Value Ref Range Status   Enterococcus faecalis NOT DETECTED NOT DETECTED Final   Enterococcus Faecium NOT DETECTED NOT DETECTED Final   Listeria monocytogenes NOT DETECTED NOT DETECTED Final   Staphylococcus species DETECTED (A) NOT DETECTED Final    Comment: CRITICAL RESULT CALLED TO, READ BACK BY AND VERIFIED WITH: RN C.ROBERTSON AT 1234 ON 12/14/2020 BY T.SAAD.    Staphylococcus aureus (BCID) NOT DETECTED NOT DETECTED Final   Staphylococcus epidermidis DETECTED (A) NOT DETECTED Final    Comment: Methicillin (oxacillin)  resistant coagulase negative staphylococcus. Possible blood culture contaminant (unless isolated from more than one blood culture draw or clinical case suggests pathogenicity). No antibiotic treatment is indicated for blood  culture contaminants. CRITICAL RESULT CALLED TO, READ BACK BY AND VERIFIED WITH: RN C.ROBERTSON AT 1234 ON 12/14/2020 BY T.SAAD.    Staphylococcus lugdunensis NOT DETECTED NOT DETECTED Final   Streptococcus species NOT DETECTED NOT DETECTED Final   Streptococcus agalactiae NOT DETECTED NOT DETECTED Final   Streptococcus pneumoniae NOT DETECTED NOT DETECTED Final   Streptococcus pyogenes NOT DETECTED NOT DETECTED Final  A.calcoaceticus-baumannii NOT DETECTED NOT DETECTED Final   Bacteroides fragilis NOT DETECTED NOT DETECTED Final   Enterobacterales NOT DETECTED NOT DETECTED Final   Enterobacter cloacae complex NOT DETECTED NOT DETECTED Final   Escherichia coli NOT DETECTED NOT DETECTED Final   Klebsiella aerogenes NOT DETECTED NOT DETECTED Final   Klebsiella oxytoca NOT DETECTED NOT DETECTED Final   Klebsiella pneumoniae NOT DETECTED NOT DETECTED Final   Proteus species NOT DETECTED NOT DETECTED Final   Salmonella species NOT DETECTED NOT DETECTED Final   Serratia marcescens NOT DETECTED NOT DETECTED Final   Haemophilus influenzae NOT DETECTED NOT DETECTED Final   Neisseria meningitidis NOT DETECTED NOT DETECTED Final   Pseudomonas aeruginosa NOT DETECTED NOT DETECTED Final   Stenotrophomonas maltophilia NOT DETECTED NOT DETECTED Final   Candida albicans NOT DETECTED NOT DETECTED Final   Candida auris NOT DETECTED NOT DETECTED Final   Candida glabrata NOT DETECTED NOT DETECTED Final   Candida krusei NOT DETECTED NOT DETECTED Final   Candida parapsilosis NOT DETECTED NOT DETECTED Final   Candida tropicalis NOT DETECTED NOT DETECTED Final   Cryptococcus neoformans/gattii NOT DETECTED NOT DETECTED Final   Methicillin resistance mecA/C DETECTED (A) NOT DETECTED Final     Comment: CRITICAL RESULT CALLED TO, READ BACK BY AND VERIFIED WITH: RN C.ROBERTSON AT 1234 ON 12/14/2020 BY T.SAAD. Performed at [facility], [address] [place], [place] [address]   Culture, blood (routine x 2)     Status: None   Collection Time: 12/17/20 11:31 AM   Specimen: BLOOD  Result Value Ref Range Status   Specimen Description BLOOD RIGHT ANTECUBITAL  Final   Special Requests   Final    BOTTLES DRAWN AEROBIC ONLY Blood Culture results may not be optimal due to an inadequate volume of blood received in culture bottles   Culture   Final    NO GROWTH 5 DAYS Performed at [facility], [address], [place], [place] [address]    Report Status 12/22/2020 FINAL  Final  Culture, blood (routine x 2)     Status: None   Collection Time: 12/17/20 11:38 AM   Specimen: BLOOD LEFT HAND  Result Value Ref Range Status   Specimen Description BLOOD LEFT HAND  Final   Special Requests   Final    BOTTLES DRAWN AEROBIC ONLY Blood Culture adequate volume   Culture   Final    NO GROWTH 5 DAYS Performed at [facility], [address] [place], [place] [address]    Report Status 12/22/2020 FINAL  Final  Culture, blood (routine x 2)     Status: None   Collection Time: 12/29/20  8:10 AM   Specimen: BLOOD RIGHT HAND  Result Value Ref Range Status   Specimen Description BLOOD RIGHT HAND  Final   Special Requests   Final    BOTTLES DRAWN AEROBIC AND ANAEROBIC Blood Culture results may not be optimal due to an inadequate volume of blood received in culture bottles   Culture   Final    NO GROWTH 5 DAYS Performed at [facility], [address] [place], [place] [address]    Report Status 01/03/2021 FINAL  Final  Culture, blood (routine x 2)     Status: None   Collection Time: 12/29/20  8:17 AM   Specimen: BLOOD RIGHT HAND  Result Value Ref Range Status   Specimen Description BLOOD RIGHT HAND  Final   Special Requests   Final  BOTTLES DRAWN AEROBIC ONLY  Blood Culture results may not be optimal due to an inadequate volume of blood received in culture bottles   Culture   Final    NO GROWTH 5 DAYS Performed at [facility], [address] [place], [place] [address]    Report Status 01/03/2021 FINAL  Final  Culture, Respiratory w Gram Stain     Status: None   Collection Time: 01/01/21 11:54 AM   Specimen: Tracheal Aspirate; Respiratory  Result Value Ref Range Status   Specimen Description TRACHEAL ASPIRATE  Final   Special Requests NONE  Final   Gram Stain   Final    RARE WBC PRESENT,BOTH PMN AND MONONUCLEAR FEW GRAM NEGATIVE RODS Performed at [facility], [address] [place], [place] [address]    Culture MODERATE PSEUDOMONAS AERUGINOSA  Final   Report Status 01/03/2021 FINAL  Final   Organism ID, Bacteria PSEUDOMONAS AERUGINOSA  Final      Susceptibility   Pseudomonas aeruginosa - MIC*    CEFTAZIDIME 4 SENSITIVE Sensitive     CIPROFLOXACIN 1 SENSITIVE Sensitive     GENTAMICIN <=1 SENSITIVE Sensitive     IMIPENEM 2 SENSITIVE Sensitive     PIP/TAZO <=4 SENSITIVE Sensitive     CEFEPIME 2 SENSITIVE Sensitive     * MODERATE PSEUDOMONAS AERUGINOSA  Culture, blood (routine x 2)     Status: None (Preliminary result)   Collection Time: 01/04/21 11:02 AM   Specimen: BLOOD RIGHT HAND  Result Value Ref Range Status   Specimen Description BLOOD RIGHT HAND  Final   Special Requests   Final    BOTTLES DRAWN AEROBIC AND ANAEROBIC Blood Culture adequate volume   Culture   Final    NO GROWTH 3 DAYS Performed at [facility], [address] [place], [place] [address]    Report Status PENDING  Incomplete  Culture, blood (routine x 2)     Status: Abnormal   Collection Time: 01/04/21 11:11 AM   Specimen: BLOOD RIGHT FOREARM  Result Value Ref Range Status   Specimen Description BLOOD RIGHT FOREARM  Final   Special Requests   Final    BOTTLES DRAWN AEROBIC AND ANAEROBIC Blood Culture results may not be optimal due to an  inadequate volume of blood received in culture bottles   Culture  Setup Time   Final    GRAM POSITIVE COCCI IN CLUSTERS ANAEROBIC BOTTLE ONLY CRITICAL RESULT CALLED TO, READ BACK BY AND VERIFIED WITH: RN D [place] [phone] AT 1003 BY CM    Culture (A)  Final    STAPHYLOCOCCUS EPIDERMIDIS THE SIGNIFICANCE OF ISOLATING THIS ORGANISM FROM A SINGLE SET OF BLOOD CULTURES WHEN MULTIPLE SETS ARE DRAWN IS UNCERTAIN. PLEASE NOTIFY THE MICROBIOLOGY DEPARTMENT WITHIN ONE WEEK IF SPECIATION AND SENSITIVITIES ARE REQUIRED. Performed at [facility], [address] [place], [place] [address]    Report Status 01/07/2021 FINAL  Final    Coagulation Studies: No results for input(s): LABPROT, INR in the last 72 hours.  Urinalysis: No results for input(s): COLORURINE, LABSPEC, PHURINE, GLUCOSEU, HGBUR, BILIRUBINUR, KETONESUR, PROTEINUR, UROBILINOGEN, NITRITE, LEUKOCYTESUR in the last 72 hours.  Invalid input(s): APPERANCEUR    Imaging: No results found.   Medications:       Assessment/ Plan:  65 y.o. male with a PMHx of ESRD on HD TTS, anemia of chronic kidney disease, hypertension, acute respiratory failure status post tracheostomy placement, seizure disorder, hyperlipidemia, diabetes mellitus type 2, history of hyperkalemia,  history of coronary artery disease, peripheral vascular disease who was admitted to [facility] on 11/21/2020 for ongoing treatment of acute respiratory failure status post tracheostomy placement and ESRD.   1.  ESRD on HD.    Patient is due for hemodialysis treatment today.  Continue dialysis on MWF schedule.  2.  Acute respiratory failure.  Remains on T-piece and having secretions.  Management per hospitalist and pulmonary/critical care.  3.  Anemia of chronic kidney disease.  Hemoglobin down to 6.7.  Recommend blood transfusion but defer to primary team.  4.  Secondary hyperparathyroidism.  Phosphorus still low.  Administer sodium phosphorus 40 mmol IV x1  today.  5.  Fever.  Respiratory culture showing Pseudomonas.  Repeat blood culture now showing positivity for Staph epidermidis.  Vancomycin level currently 22.  Recommend discontinuation of dialysis catheter after dialysis today.   LOS: 0 Carolene Gitto 7/1/20229:34 AM

## 2021-01-08 LAB — TYPE AND SCREEN
ABO/RH(D): O POS
Antibody Screen: NEGATIVE
Unit division: 0

## 2021-01-08 LAB — POTASSIUM
Potassium: 3.1 mmol/L — ABNORMAL LOW (ref 3.5–5.1)
Potassium: 3.4 mmol/L — ABNORMAL LOW (ref 3.5–5.1)

## 2021-01-08 LAB — HEMOGLOBIN A1C
Hgb A1c MFr Bld: 5.9 % — ABNORMAL HIGH (ref 4.8–5.6)
Mean Plasma Glucose: 122.63 mg/dL

## 2021-01-08 LAB — BPAM RBC
Blood Product Expiration Date: 202208032359
ISSUE DATE / TIME: 202207010945
Unit Type and Rh: 5100

## 2021-01-09 LAB — POTASSIUM
Potassium: 3.3 mmol/L — ABNORMAL LOW (ref 3.5–5.1)
Potassium: 4.6 mmol/L (ref 3.5–5.1)

## 2021-01-09 LAB — HEMOGLOBIN AND HEMATOCRIT, BLOOD
HCT: 24.9 % — ABNORMAL LOW (ref 39.0–52.0)
Hemoglobin: 8.2 g/dL — ABNORMAL LOW (ref 13.0–17.0)

## 2021-01-09 LAB — CULTURE, BLOOD (ROUTINE X 2)
Culture: NO GROWTH
Special Requests: ADEQUATE

## 2021-01-10 DIAGNOSIS — N179 Acute kidney failure, unspecified: Secondary | ICD-10-CM | POA: Diagnosis not present

## 2021-01-10 DIAGNOSIS — J151 Pneumonia due to Pseudomonas: Secondary | ICD-10-CM | POA: Diagnosis not present

## 2021-01-10 DIAGNOSIS — G931 Anoxic brain damage, not elsewhere classified: Secondary | ICD-10-CM | POA: Diagnosis not present

## 2021-01-10 DIAGNOSIS — J9621 Acute and chronic respiratory failure with hypoxia: Secondary | ICD-10-CM | POA: Diagnosis not present

## 2021-01-10 LAB — RENAL FUNCTION PANEL
Albumin: 1.7 g/dL — ABNORMAL LOW (ref 3.5–5.0)
Anion gap: 14 (ref 5–15)
BUN: 102 mg/dL — ABNORMAL HIGH (ref 8–23)
CO2: 23 mmol/L (ref 22–32)
Calcium: 8.7 mg/dL — ABNORMAL LOW (ref 8.9–10.3)
Chloride: 96 mmol/L — ABNORMAL LOW (ref 98–111)
Creatinine, Ser: 3.99 mg/dL — ABNORMAL HIGH (ref 0.61–1.24)
GFR, Estimated: 16 mL/min — ABNORMAL LOW (ref >60)
Glucose, Bld: 143 mg/dL — ABNORMAL HIGH (ref 70–99)
Phosphorus: 3.5 mg/dL (ref 2.5–4.6)
Potassium: 3.6 mmol/L (ref 3.5–5.1)
Sodium: 133 mmol/L — ABNORMAL LOW (ref 135–145)

## 2021-01-10 LAB — CBC
HCT: 23.9 % — ABNORMAL LOW (ref 39.0–52.0)
Hemoglobin: 7.9 g/dL — ABNORMAL LOW (ref 13.0–17.0)
MCH: 30.9 pg (ref 26.0–34.0)
MCHC: 33.1 g/dL (ref 30.0–36.0)
MCV: 93.4 fL (ref 80.0–100.0)
Platelets: 392 10*3/uL (ref 150–400)
RBC: 2.56 MIL/uL — ABNORMAL LOW (ref 4.22–5.81)
RDW: 17.2 % — ABNORMAL HIGH (ref 11.5–15.5)
WBC: 18.9 10*3/uL — ABNORMAL HIGH (ref 4.0–10.5)
nRBC: 0 % (ref 0.0–0.2)

## 2021-01-10 LAB — VANCOMYCIN, TROUGH: Vancomycin Tr: 14 ug/mL — ABNORMAL LOW (ref 15–20)

## 2021-01-10 NOTE — Progress Notes (Signed)
Pulmonary Critical Care Medicine [facility]   PULMONARY CRITICAL CARE SERVICE  PROGRESS NOTE     Joel Hebert  WCB:[id number]  DOB: Feb 16, 1956   DOA: 11/21/2020  Referring Physician: Carron Curie, MD  HPI: Joel Hebert is a 65 y.o. male being followed for ventilator/airway/oxygen weaning Acute on Chronic Respiratory Failure.  Patient is comfortable right now has been on T collar right now is on 28% FiO2.  Medications: Reviewed on Rounds  Physical Exam:  Vitals: The temperature is 97.7 pulse 80 respiratory 22 blood pressure is 115/59 saturations 95%  Ventilator Settings currently is off the ventilator on T collar FiO2 28%  General: Comfortable at this time Neck: supple Cardiovascular: no malignant arrhythmias Respiratory: No rhonchi very coarse breath sounds Skin: no rash seen on limited exam Musculoskeletal: No gross abnormality Psychiatric:unable to assess Neurologic:no involuntary movements         Lab Data:   Basic Metabolic Panel: Recent Labs  Lab 01/05/21 0550 01/07/21 0548 01/08/21 1022 01/08/21 2050 01/09/21 1049 01/09/21 2017 01/10/21 0556  NA 131* 134*  --   --   --   --  133*  K 3.3* 3.2* 3.1* 3.4* 3.3* 4.6 3.6  CL 94* 98  --   --   --   --  96*  CO2 23 25  --   --   --   --  23  GLUCOSE 156* 148*  --   --   --   --  143*  BUN 81* 65*  --   --   --   --  102*  CREATININE 2.94* 3.36*  --   --   --   --  3.99*  CALCIUM 8.9 8.5*  --   --   --   --  8.7*  PHOS 1.7* <1.0*  --   --   --   --  3.5    ABG: No results for input(s): PHART, PCO2ART, PO2ART, HCO3, O2SAT in the last 168 hours.  Liver Function Tests: Recent Labs  Lab 01/05/21 0550 01/07/21 0548 01/10/21 0556  ALBUMIN 2.0* 1.7* 1.7*   No results for input(s): LIPASE, AMYLASE in the last 168 hours. No results for input(s): AMMONIA in the last 168 hours.  CBC: Recent Labs  Lab 01/03/21 1639 01/05/21 0550 01/07/21 0548 01/09/21 0502  01/10/21 0556  WBC 13.5* 13.7* 12.3*  --  18.9*  HGB 7.3* 8.2* 6.7* 8.2* 7.9*  HCT 21.6* 24.3* 20.6* 24.9* 23.9*  MCV 92.7 93.5 95.8  --  93.4  PLT 326 294 265  --  392    Cardiac Enzymes: No results for input(s): CKTOTAL, CKMB, CKMBINDEX, TROPONINI in the last 168 hours.  BNP (last 3 results) No results for input(s): BNP in the last 8760 hours.  ProBNP (last 3 results) No results for input(s): PROBNP in the last 8760 hours.  Radiological Exams: No results found.  Assessment/Plan Active Problems:   Acute on chronic respiratory failure with hypoxia (HCC)   Anoxic brain injury (HCC)   Acute renal failure superimposed on chronic kidney disease (HCC)   Pneumonia due to Pseudomonas (HCC)   Acute on chronic respiratory failure hypoxia plan is to continue with the T-piece patient is on 20% FiO2 will titrate continue secretion management Anoxic brain injury patient is unchanged no change in neurological status no improvement noted Acute renal failure being seen by nephrology for dialysis the permacath was apparently removed Pneumonia due to Pseudomonas treated with antibiotics  I have personally seen and evaluated the patient, evaluated laboratory and imaging results, formulated the assessment and plan and placed orders. The Patient requires high complexity decision making with multiple systems involvement.  Rounds were done with the Respiratory Therapy Director and Staff therapists and discussed with nursing staff also.  Allyne Gee, MD [facility] Pulmonary Critical Care Medicine Sleep Medicine

## 2021-01-10 NOTE — Progress Notes (Signed)
[facility]  ROUNDING NOTE   Subjective:  Left IJ PermCath was removed. Patient sitting up in chair. Still has myoclonic twitches.   Objective:  Vital signs in last 24 hours:  Temperature 97.7 pulse 80 respirations 22 blood pressure 115/59  Physical Exam: General:  No acute distress  Head:  Normocephalic, atraumatic. 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  trace right lower extremity edema  Neurologic:  Awake, myoclonic twitches in the face  Skin:  No acute rash  Access:  Left IJ PermCath removed    Basic Metabolic Panel: Recent Labs  Lab 01/05/21 0550 01/07/21 0548 01/08/21 1022 01/08/21 2050 01/09/21 1049 01/09/21 2017 01/10/21 0556  NA 131* 134*  --   --   --   --  133*  K 3.3* 3.2* 3.1* 3.4* 3.3* 4.6 3.6  CL 94* 98  --   --   --   --  96*  CO2 23 25  --   --   --   --  23  GLUCOSE 156* 148*  --   --   --   --  143*  BUN 81* 65*  --   --   --   --  102*  CREATININE 2.94* 3.36*  --   --   --   --  3.99*  CALCIUM 8.9 8.5*  --   --   --   --  8.7*  PHOS 1.7* <1.0*  --   --   --   --  3.5     Liver Function Tests: Recent Labs  Lab 01/05/21 0550 01/07/21 0548 01/10/21 0556  ALBUMIN 2.0* 1.7* 1.7*    No results for input(s): LIPASE, AMYLASE in the last 168 hours. No results for input(s): AMMONIA in the last 168 hours.  CBC: Recent Labs  Lab 01/03/21 1639 01/05/21 0550 01/07/21 0548 01/09/21 0502 01/10/21 0556  WBC 13.5* 13.7* 12.3*  --  18.9*  HGB 7.3* 8.2* 6.7* 8.2* 7.9*  HCT 21.6* 24.3* 20.6* 24.9* 23.9*  MCV 92.7 93.5 95.8  --  93.4  PLT 326 294 265  --  392     Cardiac Enzymes: No results for input(s): CKTOTAL, CKMB, CKMBINDEX, TROPONINI in the last 168 hours.  BNP: Invalid input(s): POCBNP  CBG: No results for input(s): GLUCAP in the last 168  hours.  Microbiology: Results for orders placed or performed during the hospital encounter of 11/21/20  Culture, blood (routine x 2)     Status: None   Collection Time: 11/25/20  3:25 PM   Specimen: BLOOD RIGHT HAND  Result Value Ref Range Status   Specimen Description BLOOD RIGHT HAND  Final   Special Requests   Final    BOTTLES DRAWN AEROBIC ONLY Blood Culture results may not be optimal due to an inadequate volume of blood received in culture bottles   Culture   Final    NO GROWTH 5 DAYS Performed at [facility], [address], [place], [place] [address]    Report Status 11/30/2020 FINAL  Final  Culture, blood (routine x 2)     Status: None   Collection Time: 11/25/20  3:29 PM   Specimen: BLOOD LEFT HAND  Result Value Ref Range Status   Specimen Description BLOOD LEFT HAND  Final  Special Requests   Final    BOTTLES DRAWN AEROBIC AND ANAEROBIC Blood Culture adequate volume   Culture   Final    NO GROWTH 5 DAYS Performed at [facility], [address] [place], [place] [address]    Report Status 11/30/2020 FINAL  Final  Culture, Respiratory w Gram Stain     Status: None   Collection Time: 12/06/20 10:40 AM   Specimen: Tracheal Aspirate; Respiratory  Result Value Ref Range Status   Specimen Description TRACHEAL ASPIRATE  Final   Special Requests NONE  Final   Gram Stain   Final    ABUNDANT WBC PRESENT, PREDOMINANTLY PMN ABUNDANT GRAM NEGATIVE RODS FEW GRAM POSITIVE COCCI FEW GRAM POSITIVE RODS    Culture   Final    MODERATE PSEUDOMONAS AERUGINOSA Two isolates with different morphologies were identified as the same organism.The most resistant organism was reported. Performed at [facility], [address] [place], [place] [address]    Report Status 12/09/2020 FINAL  Final   Organism ID, Bacteria PSEUDOMONAS AERUGINOSA  Final      Susceptibility   Pseudomonas aeruginosa - MIC*    CEFTAZIDIME 16 INTERMEDIATE Intermediate     CIPROFLOXACIN 1  SENSITIVE Sensitive     GENTAMICIN 2 SENSITIVE Sensitive     IMIPENEM >=16 RESISTANT Resistant     * MODERATE PSEUDOMONAS AERUGINOSA  Culture, blood (routine x 2)     Status: None   Collection Time: 12/13/20  9:09 AM   Specimen: BLOOD LEFT HAND  Result Value Ref Range Status   Specimen Description BLOOD LEFT HAND  Final   Special Requests   Final    BOTTLES DRAWN AEROBIC AND ANAEROBIC Blood Culture results may not be optimal due to an inadequate volume of blood received in culture bottles   Culture   Final    NO GROWTH 5 DAYS Performed at [facility], [address] [place], [place] [address]    Report Status 12/18/2020 FINAL  Final  Culture, blood (routine x 2)     Status: Abnormal   Collection Time: 12/13/20  9:13 AM   Specimen: BLOOD  Result Value Ref Range Status   Specimen Description BLOOD RIGHT ANTECUBITAL  Final   Special Requests   Final    BOTTLES DRAWN AEROBIC AND ANAEROBIC Blood Culture adequate volume   Culture  Setup Time   Final    GRAM POSITIVE COCCI IN CLUSTERS AEROBIC BOTTLE ONLY CRITICAL RESULT CALLED TO, READ BACK BY AND VERIFIED WITH: RN C.ROBENSON AT 1234 ON 12/14/2020 BY T.SAAD.    Culture (A)  Final    STAPHYLOCOCCUS EPIDERMIDIS THE SIGNIFICANCE OF ISOLATING THIS ORGANISM FROM A SINGLE SET OF BLOOD CULTURES WHEN MULTIPLE SETS ARE DRAWN IS UNCERTAIN. PLEASE NOTIFY THE MICROBIOLOGY DEPARTMENT WITHIN ONE WEEK IF SPECIATION AND SENSITIVITIES ARE REQUIRED. Performed at [facility], [address] [place], [place] [address]    Report Status 12/16/2020 FINAL  Final  Blood Culture ID Panel (Reflexed)     Status: Abnormal   Collection Time: 12/13/20  9:13 AM  Result Value Ref Range Status   Enterococcus faecalis NOT DETECTED NOT DETECTED Final   Enterococcus Faecium NOT DETECTED NOT DETECTED Final   Listeria monocytogenes NOT DETECTED NOT DETECTED Final   Staphylococcus species DETECTED (A) NOT DETECTED Final    Comment: CRITICAL RESULT CALLED  TO, READ BACK BY AND VERIFIED WITH: RN C.ROBERTSON AT 1234 ON 12/14/2020 BY T.SAAD.    Staphylococcus aureus (  BCID) NOT DETECTED NOT DETECTED Final   Staphylococcus epidermidis DETECTED (A) NOT DETECTED Final    Comment: Methicillin (oxacillin) resistant coagulase negative staphylococcus. Possible blood culture contaminant (unless isolated from more than one blood culture draw or clinical case suggests pathogenicity). No antibiotic treatment is indicated for blood  culture contaminants. CRITICAL RESULT CALLED TO, READ BACK BY AND VERIFIED WITH: RN C.ROBERTSON AT 1234 ON 12/14/2020 BY T.SAAD.    Staphylococcus lugdunensis NOT DETECTED NOT DETECTED Final   Streptococcus species NOT DETECTED NOT DETECTED Final   Streptococcus agalactiae NOT DETECTED NOT DETECTED Final   Streptococcus pneumoniae NOT DETECTED NOT DETECTED Final   Streptococcus pyogenes NOT DETECTED NOT DETECTED Final   A.calcoaceticus-baumannii NOT DETECTED NOT DETECTED Final   Bacteroides fragilis NOT DETECTED NOT DETECTED Final   Enterobacterales NOT DETECTED NOT DETECTED Final   Enterobacter cloacae complex NOT DETECTED NOT DETECTED Final   Escherichia coli NOT DETECTED NOT DETECTED Final   Klebsiella aerogenes NOT DETECTED NOT DETECTED Final   Klebsiella oxytoca NOT DETECTED NOT DETECTED Final   Klebsiella pneumoniae NOT DETECTED NOT DETECTED Final   Proteus species NOT DETECTED NOT DETECTED Final   Salmonella species NOT DETECTED NOT DETECTED Final   Serratia marcescens NOT DETECTED NOT DETECTED Final   Haemophilus influenzae NOT DETECTED NOT DETECTED Final   Neisseria meningitidis NOT DETECTED NOT DETECTED Final   Pseudomonas aeruginosa NOT DETECTED NOT DETECTED Final   Stenotrophomonas maltophilia NOT DETECTED NOT DETECTED Final   Candida albicans NOT DETECTED NOT DETECTED Final   Candida auris NOT DETECTED NOT DETECTED Final   Candida glabrata NOT DETECTED NOT DETECTED Final   Candida krusei NOT DETECTED NOT  DETECTED Final   Candida parapsilosis NOT DETECTED NOT DETECTED Final   Candida tropicalis NOT DETECTED NOT DETECTED Final   Cryptococcus neoformans/gattii NOT DETECTED NOT DETECTED Final   Methicillin resistance mecA/C DETECTED (A) NOT DETECTED Final    Comment: CRITICAL RESULT CALLED TO, READ BACK BY AND VERIFIED WITH: RN C.ROBERTSON AT 1234 ON 12/14/2020 BY T.SAAD. Performed at [facility], [address] [place], [place] [address]   Culture, blood (routine x 2)     Status: None   Collection Time: 12/17/20 11:31 AM   Specimen: BLOOD  Result Value Ref Range Status   Specimen Description BLOOD RIGHT ANTECUBITAL  Final   Special Requests   Final    BOTTLES DRAWN AEROBIC ONLY Blood Culture results may not be optimal due to an inadequate volume of blood received in culture bottles   Culture   Final    NO GROWTH 5 DAYS Performed at [facility], [address], [place], [place] [address]    Report Status 12/22/2020 FINAL  Final  Culture, blood (routine x 2)     Status: None   Collection Time: 12/17/20 11:38 AM   Specimen: BLOOD LEFT HAND  Result Value Ref Range Status   Specimen Description BLOOD LEFT HAND  Final   Special Requests   Final    BOTTLES DRAWN AEROBIC ONLY Blood Culture adequate volume   Culture   Final    NO GROWTH 5 DAYS Performed at [facility], [address], [place], [place] [address]    Report Status 12/22/2020 FINAL  Final  Culture, blood (routine x 2)     Status: None   Collection Time: 12/29/20  8:10 AM   Specimen: BLOOD RIGHT HAND  Result Value Ref Range Status   Specimen Description BLOOD RIGHT HAND  Final  Special Requests   Final    BOTTLES DRAWN AEROBIC AND ANAEROBIC Blood Culture results may not be optimal due to an inadequate volume of blood received in culture bottles   Culture   Final    NO GROWTH 5 DAYS Performed at [facility], [address] [place], [place] [address]    Report Status 01/03/2021 FINAL  Final   Culture, blood (routine x 2)     Status: None   Collection Time: 12/29/20  8:17 AM   Specimen: BLOOD RIGHT HAND  Result Value Ref Range Status   Specimen Description BLOOD RIGHT HAND  Final   Special Requests   Final    BOTTLES DRAWN AEROBIC ONLY Blood Culture results may not be optimal due to an inadequate volume of blood received in culture bottles   Culture   Final    NO GROWTH 5 DAYS Performed at [facility], [address] [place], [place] [address]    Report Status 01/03/2021 FINAL  Final  Culture, Respiratory w Gram Stain     Status: None   Collection Time: 01/01/21 11:54 AM   Specimen: Tracheal Aspirate; Respiratory  Result Value Ref Range Status   Specimen Description TRACHEAL ASPIRATE  Final   Special Requests NONE  Final   Gram Stain   Final    RARE WBC PRESENT,BOTH PMN AND MONONUCLEAR FEW GRAM NEGATIVE RODS Performed at [facility], [address], [place], [place] [address]    Culture MODERATE PSEUDOMONAS AERUGINOSA  Final   Report Status 01/03/2021 FINAL  Final   Organism ID, Bacteria PSEUDOMONAS AERUGINOSA  Final      Susceptibility   Pseudomonas aeruginosa - MIC*    CEFTAZIDIME 4 SENSITIVE Sensitive     CIPROFLOXACIN 1 SENSITIVE Sensitive     GENTAMICIN <=1 SENSITIVE Sensitive     IMIPENEM 2 SENSITIVE Sensitive     PIP/TAZO <=4 SENSITIVE Sensitive     CEFEPIME 2 SENSITIVE Sensitive     * MODERATE PSEUDOMONAS AERUGINOSA  Culture, blood (routine x 2)     Status: None   Collection Time: 01/04/21 11:02 AM   Specimen: BLOOD RIGHT HAND  Result Value Ref Range Status   Specimen Description BLOOD RIGHT HAND  Final   Special Requests   Final    BOTTLES DRAWN AEROBIC AND ANAEROBIC Blood Culture adequate volume   Culture   Final    NO GROWTH 5 DAYS Performed at [facility], [address] [place], [place] [address]    Report Status 01/09/2021 FINAL  Final  Culture, blood (routine x 2)     Status: Abnormal   Collection Time: 01/04/21 11:11  AM   Specimen: BLOOD RIGHT FOREARM  Result Value Ref Range Status   Specimen Description BLOOD RIGHT FOREARM  Final   Special Requests   Final    BOTTLES DRAWN AEROBIC AND ANAEROBIC Blood Culture results may not be optimal due to an inadequate volume of blood received in culture bottles   Culture  Setup Time   Final    GRAM POSITIVE COCCI IN CLUSTERS ANAEROBIC BOTTLE ONLY CRITICAL RESULT CALLED TO, READ BACK BY AND VERIFIED WITH: RN D [place] [id number] AT 1003 BY CM    Culture (A)  Final    STAPHYLOCOCCUS EPIDERMIDIS THE SIGNIFICANCE OF ISOLATING THIS ORGANISM FROM A SINGLE SET OF BLOOD CULTURES WHEN MULTIPLE SETS ARE DRAWN IS UNCERTAIN. PLEASE NOTIFY THE MICROBIOLOGY DEPARTMENT WITHIN ONE WEEK IF SPECIATION AND SENSITIVITIES ARE REQUIRED. Performed  at [facility], [address], [place], [place] [address]    Report Status 01/07/2021 FINAL  Final    Coagulation Studies: No results for input(s): LABPROT, INR in the last 72 hours.  Urinalysis: No results for input(s): COLORURINE, LABSPEC, PHURINE, GLUCOSEU, HGBUR, BILIRUBINUR, KETONESUR, PROTEINUR, UROBILINOGEN, NITRITE, LEUKOCYTESUR in the last 72 hours.  Invalid input(s): APPERANCEUR    Imaging: No results found.   Medications:       Assessment/ Plan:  65 y.o. male with a PMHx of ESRD on HD TTS, anemia of chronic kidney disease, hypertension, acute respiratory failure status post tracheostomy placement, seizure disorder, hyperlipidemia, diabetes mellitus type 2, history of hyperkalemia, history of coronary artery disease, peripheral vascular disease who was admitted to [facility] on 11/21/2020 for ongoing treatment of acute respiratory failure status post tracheostomy placement and ESRD.   1.  ESRD on HD.    We will need to hold dialysis treatment today as he is currently catheter free.  Consider replacing dialysis catheter on Wednesday.  2.  Acute respiratory failure.  Respiratory status remained stable at  the moment.  3.  Anemia of chronic kidney disease.  Patient status post blood transfusion on 7/1.  Hemoglobin now 7.9.  4.  Secondary hyperparathyroidism.  Phosphorus up to 3.5 status post sodium phosphorus administration.  5.  Fever/sepsis.  Fever curve much improved status post permacath removal.  Antibiotic management as per infectious disease.   LOS: 0 Sigismund Cross 7/4/20228:36 AM

## 2021-01-11 ENCOUNTER — Other Ambulatory Visit (HOSPITAL_COMMUNITY): Payer: Medicare Other

## 2021-01-11 DIAGNOSIS — J151 Pneumonia due to Pseudomonas: Secondary | ICD-10-CM | POA: Diagnosis not present

## 2021-01-11 DIAGNOSIS — N179 Acute kidney failure, unspecified: Secondary | ICD-10-CM | POA: Diagnosis not present

## 2021-01-11 DIAGNOSIS — G931 Anoxic brain damage, not elsewhere classified: Secondary | ICD-10-CM | POA: Diagnosis not present

## 2021-01-11 DIAGNOSIS — J9621 Acute and chronic respiratory failure with hypoxia: Secondary | ICD-10-CM | POA: Diagnosis not present

## 2021-01-11 HISTORY — PX: IR US GUIDE VASC ACCESS RIGHT: IMG2390

## 2021-01-11 HISTORY — PX: IR FLUORO GUIDE CV LINE RIGHT: IMG2283

## 2021-01-11 LAB — CULTURE, BLOOD (ROUTINE X 2)

## 2021-01-11 NOTE — Procedures (Signed)
Interventional Radiology Procedure Note  Procedure: Placement of a right IJ approach temp triple lumen HD. 20cm.   Tip is positioned at the superior cavoatrial junction and catheter is ready for immediate use.  Complications: None Recommendations:  - Ok to use   - Routine line care   Signed,  Yvone Neu. Loreta Ave, DO

## 2021-01-11 NOTE — Progress Notes (Signed)
Pulmonary Critical Care Medicine [facility]   PULMONARY CRITICAL CARE SERVICE  PROGRESS NOTE     Joel Hebert  QBH:[id number]  DOB: 21-Oct-1955   DOA: 11/21/2020  Referring Physician: Carron Curie, MD  HPI: Joel Hebert is a 65 y.o. male being followed for ventilator/airway/oxygen weaning Acute on Chronic Respiratory Failure.  Patient is comfortable right now has been off the ventilator currently is on T collar patient's wife was present in the room and she was updated.  They are wanting all aggressive measures to be done  Medications: Reviewed on Rounds  Physical Exam:  Vitals: Temperature is 99.0 pulse 88 respiratory 22 blood pressure is 136/61 saturations 99%  Ventilator Settings on T collar with an FiO2 of 28%  General: Comfortable at this time Neck: supple Cardiovascular: no malignant arrhythmias Respiratory: No rhonchi very coarse breath sounds Skin: no rash seen on limited exam Musculoskeletal: No gross abnormality Psychiatric:unable to assess Neurologic:no involuntary movements         Lab Data:   Basic Metabolic Panel: Recent Labs  Lab 01/05/21 0550 01/07/21 0548 01/08/21 1022 01/08/21 2050 01/09/21 1049 01/09/21 2017 01/10/21 0556  NA 131* 134*  --   --   --   --  133*  K 3.3* 3.2* 3.1* 3.4* 3.3* 4.6 3.6  CL 94* 98  --   --   --   --  96*  CO2 23 25  --   --   --   --  23  GLUCOSE 156* 148*  --   --   --   --  143*  BUN 81* 65*  --   --   --   --  102*  CREATININE 2.94* 3.36*  --   --   --   --  3.99*  CALCIUM 8.9 8.5*  --   --   --   --  8.7*  PHOS 1.7* <1.0*  --   --   --   --  3.5    ABG: No results for input(s): PHART, PCO2ART, PO2ART, HCO3, O2SAT in the last 168 hours.  Liver Function Tests: Recent Labs  Lab 01/05/21 0550 01/07/21 0548 01/10/21 0556  ALBUMIN 2.0* 1.7* 1.7*   No results for input(s): LIPASE, AMYLASE in the last 168 hours. No results for input(s): AMMONIA in the last 168  hours.  CBC: Recent Labs  Lab 01/05/21 0550 01/07/21 0548 01/09/21 0502 01/10/21 0556  WBC 13.7* 12.3*  --  18.9*  HGB 8.2* 6.7* 8.2* 7.9*  HCT 24.3* 20.6* 24.9* 23.9*  MCV 93.5 95.8  --  93.4  PLT 294 265  --  392    Cardiac Enzymes: No results for input(s): CKTOTAL, CKMB, CKMBINDEX, TROPONINI in the last 168 hours.  BNP (last 3 results) No results for input(s): BNP in the last 8760 hours.  ProBNP (last 3 results) No results for input(s): PROBNP in the last 8760 hours.  Radiological Exams: No results found.  Assessment/Plan Active Problems:   Acute on chronic respiratory failure with hypoxia (HCC)   Anoxic brain injury (HCC)   Acute renal failure superimposed on chronic kidney disease (HCC)   Pneumonia due to Pseudomonas (HCC)   Acute on chronic respiratory failure with hypoxia plan is going to be to continue with the T-piece patient is right now on 28% FiO2 continue secretion management pulmonary toilet. Anoxic brain injury no change we will continue to follow along closely. Acute renal failure on chronic renal failure supportive  care Pneumonia due to Pseudomonas has been treated seems to be improving clinically although very slow   I have personally seen and evaluated the patient, evaluated laboratory and imaging results, formulated the assessment and plan and placed orders. The Patient requires high complexity decision making with multiple systems involvement.  Rounds were done with the Respiratory Therapy Director and Staff therapists and discussed with nursing staff also.  Yevonne Pax, MD [facility] Pulmonary Critical Care Medicine Sleep Medicine

## 2021-01-12 ENCOUNTER — Encounter (HOSPITAL_COMMUNITY): Payer: Self-pay

## 2021-01-12 DIAGNOSIS — J151 Pneumonia due to Pseudomonas: Secondary | ICD-10-CM | POA: Diagnosis not present

## 2021-01-12 DIAGNOSIS — G931 Anoxic brain damage, not elsewhere classified: Secondary | ICD-10-CM | POA: Diagnosis not present

## 2021-01-12 DIAGNOSIS — N179 Acute kidney failure, unspecified: Secondary | ICD-10-CM | POA: Diagnosis not present

## 2021-01-12 DIAGNOSIS — J9621 Acute and chronic respiratory failure with hypoxia: Secondary | ICD-10-CM | POA: Diagnosis not present

## 2021-01-12 LAB — CBC
HCT: 25.1 % — ABNORMAL LOW (ref 39.0–52.0)
Hemoglobin: 8.2 g/dL — ABNORMAL LOW (ref 13.0–17.0)
MCH: 30.7 pg (ref 26.0–34.0)
MCHC: 32.7 g/dL (ref 30.0–36.0)
MCV: 94 fL (ref 80.0–100.0)
Platelets: 505 10*3/uL — ABNORMAL HIGH (ref 150–400)
RBC: 2.67 MIL/uL — ABNORMAL LOW (ref 4.22–5.81)
RDW: 17.2 % — ABNORMAL HIGH (ref 11.5–15.5)
WBC: 18.5 10*3/uL — ABNORMAL HIGH (ref 4.0–10.5)
nRBC: 0 % (ref 0.0–0.2)

## 2021-01-12 LAB — RENAL FUNCTION PANEL
Albumin: 1.9 g/dL — ABNORMAL LOW (ref 3.5–5.0)
Anion gap: 12 (ref 5–15)
BUN: 44 mg/dL — ABNORMAL HIGH (ref 8–23)
CO2: 25 mmol/L (ref 22–32)
Calcium: 9.1 mg/dL (ref 8.9–10.3)
Chloride: 101 mmol/L (ref 98–111)
Creatinine, Ser: 2.45 mg/dL — ABNORMAL HIGH (ref 0.61–1.24)
GFR, Estimated: 28 mL/min — ABNORMAL LOW (ref >60)
Glucose, Bld: 147 mg/dL — ABNORMAL HIGH (ref 70–99)
Phosphorus: 1.4 mg/dL — ABNORMAL LOW (ref 2.5–4.6)
Potassium: 3.6 mmol/L (ref 3.5–5.1)
Sodium: 138 mmol/L (ref 135–145)

## 2021-01-12 NOTE — Progress Notes (Signed)
[facility]  ROUNDING NOTE   Subjective:  Patient febrile again this a.m. Seen and evaluated during hemodialysis treatment. Tolerating well. Has right IJ temporary dialysis catheter in place now.   Objective:  Vital signs in last 24 hours:  Temperature 100.6 pulse 101 respirations 25 blood pressure 115/56  Physical Exam: General:  No acute distress  Head:  Normocephalic, atraumatic. 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  trace right lower extremity edema  Neurologic:  Awake, myoclonic twitches in the face  Skin:  No acute rash  Access:  Right IJ temporary dialysis catheter    Basic Metabolic Panel: Recent Labs  Lab 01/07/21 0548 01/08/21 1022 01/08/21 2050 01/09/21 1049 01/09/21 2017 01/10/21 0556 01/12/21 0442  NA 134*  --   --   --   --  133* 138  K 3.2*   < > 3.4* 3.3* 4.6 3.6 3.6  CL 98  --   --   --   --  96* 101  CO2 25  --   --   --   --  23 25  GLUCOSE 148*  --   --   --   --  143* 147*  BUN 65*  --   --   --   --  102* 44*  CREATININE 3.36*  --   --   --   --  3.99* 2.45*  CALCIUM 8.5*  --   --   --   --  8.7* 9.1  PHOS <1.0*  --   --   --   --  3.5 1.4*   < > = values in this interval not displayed.     Liver Function Tests: Recent Labs  Lab 01/07/21 0548 01/10/21 0556 01/12/21 0442  ALBUMIN 1.7* 1.7* 1.9*    No results for input(s): LIPASE, AMYLASE in the last 168 hours. No results for input(s): AMMONIA in the last 168 hours.  CBC: Recent Labs  Lab 01/07/21 0548 01/09/21 0502 01/10/21 0556 01/12/21 0442  WBC 12.3*  --  18.9* 18.5*  HGB 6.7* 8.2* 7.9* 8.2*  HCT 20.6* 24.9* 23.9* 25.1*  MCV 95.8  --  93.4 94.0  PLT 265  --  392 505*     Cardiac Enzymes: No results for input(s): CKTOTAL, CKMB, CKMBINDEX, TROPONINI in the last 168  hours.  BNP: Invalid input(s): POCBNP  CBG: No results for input(s): GLUCAP in the last 168 hours.  Microbiology: Results for orders placed or performed during the hospital encounter of 11/21/20  Culture, blood (routine x 2)     Status: None   Collection Time: 11/25/20  3:25 PM   Specimen: BLOOD RIGHT HAND  Result Value Ref Range Status   Specimen Description BLOOD RIGHT HAND  Final   Special Requests   Final    BOTTLES DRAWN AEROBIC ONLY Blood Culture results may not be optimal due to an inadequate volume of blood received in culture bottles   Culture   Final    NO GROWTH 5 DAYS Performed at [facility], [place] [address] [place], [place] [address]    Report Status 11/30/2020 FINAL  Final  Culture, blood (routine x 2)     Status: None   Collection Time: 11/25/20  3:29 PM   Specimen: BLOOD LEFT HAND  Result Value Ref Range Status   Specimen Description BLOOD LEFT HAND  Final   Special Requests   Final    BOTTLES DRAWN AEROBIC AND ANAEROBIC Blood Culture adequate volume   Culture   Final    NO GROWTH 5 DAYS Performed at [facility], [address] [place], [place] [address]    Report Status 11/30/2020 FINAL  Final  Culture, Respiratory w Gram Stain     Status: None   Collection Time: 12/06/20 10:40 AM   Specimen: Tracheal Aspirate; Respiratory  Result Value Ref Range Status   Specimen Description TRACHEAL ASPIRATE  Final   Special Requests NONE  Final   Gram Stain   Final    ABUNDANT WBC PRESENT, PREDOMINANTLY PMN ABUNDANT GRAM NEGATIVE RODS FEW GRAM POSITIVE COCCI FEW GRAM POSITIVE RODS    Culture   Final    MODERATE PSEUDOMONAS AERUGINOSA Two isolates with different morphologies were identified as the same organism.The most resistant organism was reported. Performed at [facility], [place] [address], [place], [place] [address]    Report Status 12/09/2020 FINAL  Final   Organism ID, Bacteria PSEUDOMONAS AERUGINOSA  Final      Susceptibility    Pseudomonas aeruginosa - MIC*    CEFTAZIDIME 16 INTERMEDIATE Intermediate     CIPROFLOXACIN 1 SENSITIVE Sensitive     GENTAMICIN 2 SENSITIVE Sensitive     IMIPENEM >=16 RESISTANT Resistant     * MODERATE PSEUDOMONAS AERUGINOSA  Culture, blood (routine x 2)     Status: None   Collection Time: 12/13/20  9:09 AM   Specimen: BLOOD LEFT HAND  Result Value Ref Range Status   Specimen Description BLOOD LEFT HAND  Final   Special Requests   Final    BOTTLES DRAWN AEROBIC AND ANAEROBIC Blood Culture results may not be optimal due to an inadequate volume of blood received in culture bottles   Culture   Final    NO GROWTH 5 DAYS Performed at [facility], [place] [address], [place], [place] [address]    Report Status 12/18/2020 FINAL  Final  Culture, blood (routine x 2)     Status: Abnormal   Collection Time: 12/13/20  9:13 AM   Specimen: BLOOD  Result Value Ref Range Status   Specimen Description BLOOD RIGHT ANTECUBITAL  Final   Special Requests   Final    BOTTLES DRAWN AEROBIC AND ANAEROBIC Blood Culture adequate volume   Culture  Setup Time   Final    GRAM POSITIVE COCCI IN CLUSTERS AEROBIC BOTTLE ONLY CRITICAL RESULT CALLED TO, READ BACK BY AND VERIFIED WITH: RN C.ROBENSON AT 0174 ON 12/14/2020 BY T.SAAD.    Culture (A)  Final    STAPHYLOCOCCUS EPIDERMIDIS THE SIGNIFICANCE OF ISOLATING THIS ORGANISM FROM A SINGLE SET OF BLOOD CULTURES WHEN MULTIPLE SETS ARE DRAWN IS UNCERTAIN. PLEASE NOTIFY THE MICROBIOLOGY DEPARTMENT WITHIN ONE WEEK IF SPECIATION AND SENSITIVITIES ARE REQUIRED. Performed at [facility], [place] [address] [place], [place] [address]    Report Status 12/16/2020 FINAL  Final  Blood Culture ID Panel (Reflexed)     Status: Abnormal   Collection Time: 12/13/20  9:13 AM  Result Value Ref Range Status   Enterococcus faecalis NOT DETECTED NOT DETECTED Final   Enterococcus Faecium NOT DETECTED NOT DETECTED Final   Listeria monocytogenes NOT DETECTED NOT DETECTED Final    Staphylococcus species DETECTED (A) NOT DETECTED Final    Comment: CRITICAL RESULT CALLED TO, READ BACK BY  AND VERIFIED WITH: RN C.ROBERTSON AT 7412 ON 12/14/2020 BY T.SAAD.    Staphylococcus aureus (BCID) NOT DETECTED NOT DETECTED Final   Staphylococcus epidermidis DETECTED (A) NOT DETECTED Final    Comment: Methicillin (oxacillin) resistant coagulase negative staphylococcus. Possible blood culture contaminant (unless isolated from more than one blood culture draw or clinical case suggests pathogenicity). No antibiotic treatment is indicated for blood  culture contaminants. CRITICAL RESULT CALLED TO, READ BACK BY AND VERIFIED WITH: RN C.ROBERTSON AT 8786 ON 12/14/2020 BY T.SAAD.    Staphylococcus lugdunensis NOT DETECTED NOT DETECTED Final   Streptococcus species NOT DETECTED NOT DETECTED Final   Streptococcus agalactiae NOT DETECTED NOT DETECTED Final   Streptococcus pneumoniae NOT DETECTED NOT DETECTED Final   Streptococcus pyogenes NOT DETECTED NOT DETECTED Final   A.calcoaceticus-baumannii NOT DETECTED NOT DETECTED Final   Bacteroides fragilis NOT DETECTED NOT DETECTED Final   Enterobacterales NOT DETECTED NOT DETECTED Final   Enterobacter cloacae complex NOT DETECTED NOT DETECTED Final   Escherichia coli NOT DETECTED NOT DETECTED Final   Klebsiella aerogenes NOT DETECTED NOT DETECTED Final   Klebsiella oxytoca NOT DETECTED NOT DETECTED Final   Klebsiella pneumoniae NOT DETECTED NOT DETECTED Final   Proteus species NOT DETECTED NOT DETECTED Final   Salmonella species NOT DETECTED NOT DETECTED Final   Serratia marcescens NOT DETECTED NOT DETECTED Final   Haemophilus influenzae NOT DETECTED NOT DETECTED Final   Neisseria meningitidis NOT DETECTED NOT DETECTED Final   Pseudomonas aeruginosa NOT DETECTED NOT DETECTED Final   Stenotrophomonas maltophilia NOT DETECTED NOT DETECTED Final   Candida albicans NOT DETECTED NOT DETECTED Final   Candida auris NOT DETECTED NOT DETECTED  Final   Candida glabrata NOT DETECTED NOT DETECTED Final   Candida krusei NOT DETECTED NOT DETECTED Final   Candida parapsilosis NOT DETECTED NOT DETECTED Final   Candida tropicalis NOT DETECTED NOT DETECTED Final   Cryptococcus neoformans/gattii NOT DETECTED NOT DETECTED Final   Methicillin resistance mecA/C DETECTED (A) NOT DETECTED Final    Comment: CRITICAL RESULT CALLED TO, READ BACK BY AND VERIFIED WITH: RN C.ROBERTSON AT 7672 ON 12/14/2020 BY T.SAAD. Performed at [facility], [place] [address] [place], [place] [address]   Culture, blood (routine x 2)     Status: None   Collection Time: 12/17/20 11:31 AM   Specimen: BLOOD  Result Value Ref Range Status   Specimen Description BLOOD RIGHT ANTECUBITAL  Final   Special Requests   Final    BOTTLES DRAWN AEROBIC ONLY Blood Culture results may not be optimal due to an inadequate volume of blood received in culture bottles   Culture   Final    NO GROWTH 5 DAYS Performed at [facility], [place] [address], [place], [place] [address]    Report Status 12/22/2020 FINAL  Final  Culture, blood (routine x 2)     Status: None   Collection Time: 12/17/20 11:38 AM   Specimen: BLOOD LEFT HAND  Result Value Ref Range Status   Specimen Description BLOOD LEFT HAND  Final   Special Requests   Final    BOTTLES DRAWN AEROBIC ONLY Blood Culture adequate volume   Culture   Final    NO GROWTH 5 DAYS Performed at [facility], [place] [address] [place], [place] [address]    Report Status 12/22/2020 FINAL  Final  Culture, blood (routine x 2)     Status: None   Collection Time: 12/29/20  8:10 AM   Specimen: BLOOD RIGHT  HAND  Result Value Ref Range Status   Specimen Description BLOOD RIGHT HAND  Final   Special Requests   Final    BOTTLES DRAWN AEROBIC AND ANAEROBIC Blood Culture results may not be optimal due to an inadequate volume of blood received in culture bottles   Culture   Final    NO GROWTH 5 DAYS Performed at [facility], [place] [address], [place], [place] [address]    Report Status 01/03/2021 FINAL  Final  Culture, blood (routine x 2)     Status: None   Collection Time: 12/29/20  8:17 AM   Specimen: BLOOD RIGHT HAND  Result Value Ref Range Status   Specimen Description BLOOD RIGHT HAND  Final   Special Requests   Final    BOTTLES DRAWN AEROBIC ONLY Blood Culture results may not be optimal due to an inadequate volume of blood received in culture bottles   Culture   Final    NO GROWTH 5 DAYS Performed at [facility], [place] [address] [place], [place] [address]    Report Status 01/03/2021 FINAL  Final  Culture, Respiratory w Gram Stain     Status: None   Collection Time: 01/01/21 11:54 AM   Specimen: Tracheal Aspirate; Respiratory  Result Value Ref Range Status   Specimen Description TRACHEAL ASPIRATE  Final   Special Requests NONE  Final   Gram Stain   Final    RARE WBC PRESENT,BOTH PMN AND MONONUCLEAR FEW GRAM NEGATIVE RODS Performed at [facility], [place] [address], [place], [place] [address]    Culture MODERATE PSEUDOMONAS AERUGINOSA  Final   Report Status 01/03/2021 FINAL  Final   Organism ID, Bacteria PSEUDOMONAS AERUGINOSA  Final      Susceptibility   Pseudomonas aeruginosa - MIC*    CEFTAZIDIME 4 SENSITIVE Sensitive     CIPROFLOXACIN 1 SENSITIVE Sensitive     GENTAMICIN <=1 SENSITIVE Sensitive     IMIPENEM 2 SENSITIVE Sensitive     PIP/TAZO <=4 SENSITIVE Sensitive     CEFEPIME 2 SENSITIVE Sensitive     * MODERATE PSEUDOMONAS AERUGINOSA  Culture, blood (routine x 2)     Status: None   Collection Time: 01/04/21 11:02 AM   Specimen: BLOOD RIGHT HAND  Result Value Ref Range Status   Specimen Description BLOOD RIGHT HAND  Final   Special Requests   Final    BOTTLES DRAWN AEROBIC AND ANAEROBIC Blood Culture adequate volume   Culture   Final    NO GROWTH 5 DAYS Performed at [facility], [address] [place], [place] [address]    Report Status 01/09/2021 FINAL   Final  Culture, blood (routine x 2)     Status: Abnormal   Collection Time: 01/04/21 11:11 AM   Specimen: BLOOD RIGHT FOREARM  Result Value Ref Range Status   Specimen Description BLOOD RIGHT FOREARM  Final   Special Requests   Final    BOTTLES DRAWN AEROBIC AND ANAEROBIC Blood Culture results may not be optimal due to an inadequate volume of blood received in culture bottles   Culture  Setup Time   Final    GRAM POSITIVE COCCI IN CLUSTERS ANAEROBIC BOTTLE ONLY CRITICAL RESULT CALLED TO, READ BACK BY AND VERIFIED WITH: RN D [place] [id number] AT 1003 BY CM    Culture (A)  Final    STAPHYLOCOCCUS EPIDERMIDIS THE SIGNIFICANCE OF ISOLATING THIS ORGANISM FROM A SINGLE SET OF BLOOD CULTURES WHEN MULTIPLE SETS ARE  DRAWN IS UNCERTAIN. PLEASE NOTIFY THE MICROBIOLOGY DEPARTMENT WITHIN ONE WEEK IF SPECIATION AND SENSITIVITIES ARE REQUIRED. Performed at [facility], [place] [address] [place], [place] [address]    Report Status 01/07/2021 FINAL  Final  Culture, blood (routine x 2)     Status: None (Preliminary result)   Collection Time: 01/09/21  5:00 PM   Specimen: BLOOD  Result Value Ref Range Status   Specimen Description BLOOD RIGHT ANTECUBITAL  Final   Special Requests   Final    BOTTLES DRAWN AEROBIC AND ANAEROBIC Blood Culture results may not be optimal due to an inadequate volume of blood received in culture bottles   Culture   Final    NO GROWTH 3 DAYS Performed at [facility], [place] [address], [place], [place] [address]    Report Status PENDING  Incomplete  Culture, blood (routine x 2)     Status: Abnormal   Collection Time: 01/09/21  5:04 PM   Specimen: BLOOD  Result Value Ref Range Status   Specimen Description BLOOD RIGHT ANTECUBITAL  Final   Special Requests   Final    BOTTLES DRAWN AEROBIC ONLY Blood Culture results may not be optimal due to an inadequate volume of blood received in culture bottles   Culture  Setup Time   Final    GRAM POSITIVE COCCI IN  CLUSTERS AEROBIC BOTTLE ONLY CRITICAL VALUE NOTED.  VALUE IS CONSISTENT WITH PREVIOUSLY REPORTED AND CALLED VALUE.    Culture (A)  Final    STAPHYLOCOCCUS CAPITIS THE SIGNIFICANCE OF ISOLATING THIS ORGANISM FROM A SINGLE SET OF BLOOD CULTURES WHEN MULTIPLE SETS ARE DRAWN IS UNCERTAIN. PLEASE NOTIFY THE MICROBIOLOGY DEPARTMENT WITHIN ONE WEEK IF SPECIATION AND SENSITIVITIES ARE REQUIRED. Performed at [facility], [place] [address], [place], [place] [address]    Report Status 01/11/2021 FINAL  Final    Coagulation Studies: No results for input(s): LABPROT, INR in the last 72 hours.  Urinalysis: No results for input(s): COLORURINE, LABSPEC, PHURINE, GLUCOSEU, HGBUR, BILIRUBINUR, KETONESUR, PROTEINUR, UROBILINOGEN, NITRITE, LEUKOCYTESUR in the last 72 hours.  Invalid input(s): APPERANCEUR    Imaging: IR Fluoro Guide CV Line Right  Result Date: 01/11/2021 INDICATION: 65 year old male referred for image guided temporary hemodialysis catheter EXAM: IMAGE GUIDED TEMPORARY HEMODIALYSIS CATHETER MEDICATIONS: None ANESTHESIA/SEDATION: None FLUOROSCOPY TIME:  Fluoroscopy Time: 0 minutes 12 seconds (1 mGy). COMPLICATIONS: None PROCEDURE: Informed written consent was obtained from the patient's family after a discussion of the risks, benefits, and alternatives to treatment. Questions regarding the procedure were encouraged and answered. The right neck was prepped with chlorhexidine in a sterile fashion, and a sterile drape was applied covering the operative field. Maximum barrier sterile technique with sterile gowns and gloves were used for the procedure. A timeout was performed prior to the initiation of the procedure. A micropuncture kit was utilized to access the right internal jugular vein under direct, real-time ultrasound guidance after the overlying soft tissues were anesthetized with 1% lidocaine with epinephrine. Ultrasound image documentation was performed. The microwire was kinked to measure  appropriate catheter length. A guidewire was advanced to the level of the IVC. A 20cm hemodialysis catheter was then placed over the wire. Final catheter positioning was confirmed and documented with a spot radiographic image. The catheter aspirates and flushes normally. The catheter was flushed with appropriate volume heparin dwells. Dressings were applied. The patient tolerated the procedure well without immediate post procedural complication. IMPRESSION: Status post temporary right IJ hemodialysis catheter. Signed, Dulcy Fanny.  Dellia Nims, RPVI [facility] Electronically Signed   By: Corrie Mckusick D.O.   On: 01/11/2021 13:48   IR US Guide Vasc Access Right  INDICATION: 65 year old male referred for image guided temporary hemodialysis catheter   EXAM: IMAGE GUIDED TEMPORARY HEMODIALYSIS CATHETER   MEDICATIONS: None   ANESTHESIA/SEDATION: None   FLUOROSCOPY TIME:  Fluoroscopy Time: 0 minutes 12 seconds (1 mGy).   COMPLICATIONS: None   PROCEDURE: Informed written consent was obtained from the patient's family after a discussion of the risks, benefits, and alternatives to treatment. Questions regarding the procedure were encouraged and answered. The right neck was prepped with chlorhexidine in a sterile fashion, and a sterile drape was applied covering the operative field. Maximum barrier sterile technique with sterile gowns and gloves were used for the procedure. A timeout was performed prior to the initiation of the procedure.   A micropuncture kit was utilized to access the right internal jugular vein under direct, real-time ultrasound guidance after the overlying soft tissues were anesthetized with 1% lidocaine with epinephrine. Ultrasound image documentation was performed. The microwire was kinked to measure appropriate catheter length. A guidewire was advanced to the level of the IVC. A 20cm hemodialysis catheter was then placed over the wire.   Final  catheter positioning was confirmed and documented with a spot radiographic image. The catheter aspirates and flushes normally. The catheter was flushed with appropriate volume heparin dwells.   Dressings were applied. The patient tolerated the procedure well without immediate post procedural complication.   IMPRESSION: Status post temporary right IJ hemodialysis catheter.   Signed,   Dulcy Fanny. Dellia Nims, RPVI   [facility]   [facility]     Electronically Signed   By: Corrie Mckusick D.O.   On: 01/11/2021 13:48      Medications:       Assessment/ Plan:  65 y.o. male with a PMHx of ESRD on HD TTS, anemia of chronic kidney disease, hypertension, acute respiratory failure status post tracheostomy placement, seizure disorder, hyperlipidemia, diabetes mellitus type 2, history of hyperkalemia, history of coronary artery disease, peripheral vascular disease who was admitted to [facility] on 11/21/2020 for ongoing treatment of acute respiratory failure status post tracheostomy placement and ESRD.   1.  ESRD on HD.    Patient seen and evaluated during hemodialysis treatment today.  Ultrafiltration target currently 2 kg.  We plan to complete dialysis treatment today.  2.  Acute respiratory failure.  Continues to do well off the ventilator.  3.  Anemia of chronic kidney disease.  Hemoglobin up to 8.2.  Continue Retacrit.  4.  Secondary hyperparathyroidism.  Phosphorus is dropped again to 1.4.  Administer sodium phosphorus 40 mmol IV x1.  5.  Fever/sepsis.  Temporary dialysis catheter placed.  Recent blood cultures are showing Staph capitis.  Await further input from infectious disease.   LOS: 0 Joel Hebert 7/6/202212:20 PM

## 2021-01-12 NOTE — Progress Notes (Signed)
Pulmonary Critical Care Medicine [place]   PULMONARY CRITICAL CARE SERVICE  PROGRESS NOTE     Joel Hebert  GNO:[id number]  DOB: 1956-02-09   DOA: 11/21/2020  Referring Physician: Merton Border, MD  HPI: Joel Hebert is a 65 y.o. male being followed for ventilator/airway/oxygen weaning Acute on Chronic Respiratory Failure.  Patient is comfortable right now on T collar secretions are still quite copious.  Remains without distress  Medications: Reviewed on Rounds  Physical Exam:  Vitals: Temperature is 100.6 pulse 101 respiratory rate is 25 blood pressure is 115/56 saturations 96%  Ventilator Settings T collar currently FiO2 28%  General: Comfortable at this time Neck: supple Cardiovascular: no malignant arrhythmias Respiratory: Scattered rhonchi expansion is equal Skin: no rash seen on limited exam Musculoskeletal: No gross abnormality Psychiatric:unable to assess Neurologic:no involuntary movements         Lab Data:   Basic Metabolic Panel: Recent Labs  Lab 01/07/21 0548 01/08/21 1022 01/08/21 2050 01/09/21 1049 01/09/21 2017 01/10/21 0556 01/12/21 0442  NA 134*  --   --   --   --  133* 138  K 3.2*   < > 3.4* 3.3* 4.6 3.6 3.6  CL 98  --   --   --   --  96* 101  CO2 25  --   --   --   --  23 25  GLUCOSE 148*  --   --   --   --  143* 147*  BUN 65*  --   --   --   --  102* 44*  CREATININE 3.36*  --   --   --   --  3.99* 2.45*  CALCIUM 8.5*  --   --   --   --  8.7* 9.1  PHOS <1.0*  --   --   --   --  3.5 1.4*   < > = values in this interval not displayed.    ABG: No results for input(s): PHART, PCO2ART, PO2ART, HCO3, O2SAT in the last 168 hours.  Liver Function Tests: Recent Labs  Lab 01/07/21 0548 01/10/21 0556 01/12/21 0442  ALBUMIN 1.7* 1.7* 1.9*   No results for input(s): LIPASE, AMYLASE in the last 168 hours. No results for input(s): AMMONIA in the last 168 hours.  CBC: Recent Labs  Lab 01/07/21 0548  01/09/21 0502 01/10/21 0556 01/12/21 0442  WBC 12.3*  --  18.9* 18.5*  HGB 6.7* 8.2* 7.9* 8.2*  HCT 20.6* 24.9* 23.9* 25.1*  MCV 95.8  --  93.4 94.0  PLT 265  --  392 505*    Cardiac Enzymes: No results for input(s): CKTOTAL, CKMB, CKMBINDEX, TROPONINI in the last 168 hours.  BNP (last 3 results) No results for input(s): BNP in the last 8760 hours.  ProBNP (last 3 results) No results for input(s): PROBNP in the last 8760 hours.  Radiological Exams: IR Fluoro Guide CV Line Right  Result Date: 01/11/2021 INDICATION: 65 year old male referred for image guided temporary hemodialysis catheter EXAM: IMAGE GUIDED TEMPORARY HEMODIALYSIS CATHETER MEDICATIONS: None ANESTHESIA/SEDATION: None FLUOROSCOPY TIME:  Fluoroscopy Time: 0 minutes 12 seconds (1 mGy). COMPLICATIONS: None PROCEDURE: Informed written consent was obtained from the patient's family after a discussion of the risks, benefits, and alternatives to treatment. Questions regarding the procedure were encouraged and answered. The right neck was prepped with chlorhexidine in a sterile fashion, and a sterile drape was applied covering the operative field. Maximum barrier sterile technique with sterile  gowns and gloves were used for the procedure. A timeout was performed prior to the initiation of the procedure. A micropuncture kit was utilized to access the right internal jugular vein under direct, real-time ultrasound guidance after the overlying soft tissues were anesthetized with 1% lidocaine with epinephrine. Ultrasound image documentation was performed. The microwire was kinked to measure appropriate catheter length. A guidewire was advanced to the level of the IVC. A 20cm hemodialysis catheter was then placed over the wire. Final catheter positioning was confirmed and documented with a spot radiographic image. The catheter aspirates and flushes normally. The catheter was flushed with appropriate volume heparin dwells. Dressings were applied.  The patient tolerated the procedure well without immediate post procedural complication. IMPRESSION: Status post temporary right IJ hemodialysis catheter. Signed, Dulcy Fanny. Dellia Nims, RPVI [facility] Radiology Electronically Signed   By: Corrie Mckusick D.O.   On: 01/11/2021 13:48    Assessment/Plan Active Problems:   Acute on chronic respiratory failure with hypoxia (HCC)   Anoxic brain injury ([place])   Acute renal failure superimposed on chronic kidney disease ([place])   Pneumonia due to Pseudomonas ([place])   Acute on chronic respiratory failure with hypoxia continue with the T-piece secretions are moderate we will continue to follow. Anoxic brain injury no change we will continue to follow along closely. Acute renal failure on chronic renal failure plan is going to be to continue with full supportive care Pneumonia due to Pseudomonas treated we will continue to follow along closely   I have personally seen and evaluated the patient, evaluated laboratory and imaging results, formulated the assessment and plan and placed orders. The Patient requires high complexity decision making with multiple systems involvement.  Rounds were done with the Respiratory Therapy Director and Staff therapists and discussed with nursing staff also.  Allyne Gee, MD [facility] Pulmonary Critical Care Medicine Sleep Medicine

## 2021-01-13 DIAGNOSIS — G931 Anoxic brain damage, not elsewhere classified: Secondary | ICD-10-CM | POA: Diagnosis not present

## 2021-01-13 DIAGNOSIS — J9621 Acute and chronic respiratory failure with hypoxia: Secondary | ICD-10-CM | POA: Diagnosis not present

## 2021-01-13 DIAGNOSIS — J151 Pneumonia due to Pseudomonas: Secondary | ICD-10-CM | POA: Diagnosis not present

## 2021-01-13 DIAGNOSIS — N179 Acute kidney failure, unspecified: Secondary | ICD-10-CM | POA: Diagnosis not present

## 2021-01-13 NOTE — Progress Notes (Signed)
Pulmonary Critical Care Medicine [place]   PULMONARY CRITICAL CARE SERVICE  PROGRESS NOTE     Montana Fassnacht  GTX:[id number]  DOB: May 21, 1956   DOA: 11/21/2020  Referring Physician: Merton Border, MD  HPI: Gordie Crumby is a 66 y.o. male being followed for ventilator/airway/oxygen weaning Acute on Chronic Respiratory Failure.  At this time patient is unchanged remains on T collar secretions are still  Medications: Reviewed on Rounds  Physical Exam:  Vitals: Temperature is 98 pulse 99 respiratory rate is 18  Ventilator Settings off the ventilator on T collar FiO2 28%  General: Comfortable at this time Neck: supple Cardiovascular: no malignant arrhythmias Respiratory: No rhonchi no rales Skin: no rash seen on limited exam Musculoskeletal: No gross abnormality Psychiatric:unable to assess Neurologic:no involuntary movements         Lab Data:   Basic Metabolic Panel: Recent Labs  Lab 01/07/21 0548 01/08/21 1022 01/08/21 2050 01/09/21 1049 01/09/21 2017 01/10/21 0556 01/12/21 0442  NA 134*  --   --   --   --  133* 138  K 3.2*   < > 3.4* 3.3* 4.6 3.6 3.6  CL 98  --   --   --   --  96* 101  CO2 25  --   --   --   --  23 25  GLUCOSE 148*  --   --   --   --  143* 147*  BUN 65*  --   --   --   --  102* 44*  CREATININE 3.36*  --   --   --   --  3.99* 2.45*  CALCIUM 8.5*  --   --   --   --  8.7* 9.1  PHOS <1.0*  --   --   --   --  3.5 1.4*   < > = values in this interval not displayed.    ABG: No results for input(s): PHART, PCO2ART, PO2ART, HCO3, O2SAT in the last 168 hours.  Liver Function Tests: Recent Labs  Lab 01/07/21 0548 01/10/21 0556 01/12/21 0442  ALBUMIN 1.7* 1.7* 1.9*   No results for input(s): LIPASE, AMYLASE in the last 168 hours. No results for input(s): AMMONIA in the last 168 hours.  CBC: Recent Labs  Lab 01/07/21 0548 01/09/21 0502 01/10/21 0556 01/12/21 0442  WBC 12.3*  --  18.9* 18.5*  HGB 6.7*  8.2* 7.9* 8.2*  HCT 20.6* 24.9* 23.9* 25.1*  MCV 95.8  --  93.4 94.0  PLT 265  --  392 505*    Cardiac Enzymes: No results for input(s): CKTOTAL, CKMB, CKMBINDEX, TROPONINI in the last 168 hours.  BNP (last 3 results) No results for input(s): BNP in the last 8760 hours.  ProBNP (last 3 results) No results for input(s): PROBNP in the last 8760 hours.  Radiological Exams: IR Fluoro Guide CV Line Right  Result Date: 01/11/2021 INDICATION: 65 year old male referred for image guided temporary hemodialysis catheter EXAM: IMAGE GUIDED TEMPORARY HEMODIALYSIS CATHETER MEDICATIONS: None ANESTHESIA/SEDATION: None FLUOROSCOPY TIME:  Fluoroscopy Time: 0 minutes 12 seconds (1 mGy). COMPLICATIONS: None PROCEDURE: Informed written consent was obtained from the patient's family after a discussion of the risks, benefits, and alternatives to treatment. Questions regarding the procedure were encouraged and answered. The right neck was prepped with chlorhexidine in a sterile fashion, and a sterile drape was applied covering the operative field. Maximum barrier sterile technique with sterile gowns and gloves were used for the procedure.  A timeout was performed prior to the initiation of the procedure. A micropuncture kit was utilized to access the right internal jugular vein under direct, real-time ultrasound guidance after the overlying soft tissues were anesthetized with 1% lidocaine with epinephrine. Ultrasound image documentation was performed. The microwire was kinked to measure appropriate catheter length. A guidewire was advanced to the level of the IVC. A 20cm hemodialysis catheter was then placed over the wire. Final catheter positioning was confirmed and documented with a spot radiographic image. The catheter aspirates and flushes normally. The catheter was flushed with appropriate volume heparin dwells. Dressings were applied. The patient tolerated the procedure well without immediate post procedural  complication. IMPRESSION: Status post temporary right IJ hemodialysis catheter. Signed, Dulcy Fanny. Dellia Nims, RPVI [facility] Radiology Electronically Signed   By: Corrie Mckusick D.O.   On: 01/11/2021 13:48   IR US Guide Vasc Access Right  Result Date: 01/12/2021 INDICATION: 65 year old male referred for image guided temporary hemodialysis catheter EXAM: IMAGE GUIDED TEMPORARY HEMODIALYSIS CATHETER MEDICATIONS: None ANESTHESIA/SEDATION: None FLUOROSCOPY TIME:  Fluoroscopy Time: 0 minutes 12 seconds (1 mGy). COMPLICATIONS: None PROCEDURE: Informed written consent was obtained from the patient's family after a discussion of the risks, benefits, and alternatives to treatment. Questions regarding the procedure were encouraged and answered. The right neck was prepped with chlorhexidine in a sterile fashion, and a sterile drape was applied covering the operative field. Maximum barrier sterile technique with sterile gowns and gloves were used for the procedure. A timeout was performed prior to the initiation of the procedure. A micropuncture kit was utilized to access the right internal jugular vein under direct, real-time ultrasound guidance after the overlying soft tissues were anesthetized with 1% lidocaine with epinephrine. Ultrasound image documentation was performed. The microwire was kinked to measure appropriate catheter length. A guidewire was advanced to the level of the IVC. A 20cm hemodialysis catheter was then placed over the wire. Final catheter positioning was confirmed and documented with a spot radiographic image. The catheter aspirates and flushes normally. The catheter was flushed with appropriate volume heparin dwells. Dressings were applied. The patient tolerated the procedure well without immediate post procedural complication. IMPRESSION: Status post temporary right IJ hemodialysis catheter. Signed, Dulcy Fanny. Dellia Nims, RPVI [facility]  Inter[facility] Radiology Special[facility] Radiology Electronically Signed   By: Corrie Mckusick D.O.   On: 01/11/2021 13:48    Assessment/Plan Active Problems:   Acute on chronic respiratory failure with hypoxia (HCC)   Anoxic brain injury ([place])   Acute renal failure superimposed on chronic kidney disease ([place])   Pneumonia due to Pseudomonas ([place])   Acute on chronic respiratory failure with hypoxia we will continue with T collar trials patient is at baseline secretions are moderate. Anoxic brain injury no change Acute renal failure following patient's labs Pneumonia due to Pseudomonas treated slowly improving   I have personally seen and evaluated the patient, evaluated laboratory and imaging results, formulated the assessment and plan and placed orders. The Patient requires high complexity decision making with multiple systems involvement.  Rounds were done with the Respiratory Therapy Director and Staff therapists and discussed with nursing staff also.  Allyne Gee, MD [facility] Pulmonary Critical Care Medicine Sleep Medicine

## 2021-01-14 DIAGNOSIS — J9621 Acute and chronic respiratory failure with hypoxia: Secondary | ICD-10-CM | POA: Diagnosis not present

## 2021-01-14 DIAGNOSIS — N179 Acute kidney failure, unspecified: Secondary | ICD-10-CM | POA: Diagnosis not present

## 2021-01-14 DIAGNOSIS — G931 Anoxic brain damage, not elsewhere classified: Secondary | ICD-10-CM | POA: Diagnosis not present

## 2021-01-14 DIAGNOSIS — J151 Pneumonia due to Pseudomonas: Secondary | ICD-10-CM | POA: Diagnosis not present

## 2021-01-14 LAB — RENAL FUNCTION PANEL
Albumin: 1.9 g/dL — ABNORMAL LOW (ref 3.5–5.0)
Anion gap: 14 (ref 5–15)
BUN: 43 mg/dL — ABNORMAL HIGH (ref 8–23)
CO2: 23 mmol/L (ref 22–32)
Calcium: 9.4 mg/dL (ref 8.9–10.3)
Chloride: 100 mmol/L (ref 98–111)
Creatinine, Ser: 3.3 mg/dL — ABNORMAL HIGH (ref 0.61–1.24)
GFR, Estimated: 20 mL/min — ABNORMAL LOW (ref >60)
Glucose, Bld: 133 mg/dL — ABNORMAL HIGH (ref 70–99)
Phosphorus: 6.5 mg/dL — ABNORMAL HIGH (ref 2.5–4.6)
Potassium: 5 mmol/L (ref 3.5–5.1)
Sodium: 137 mmol/L (ref 135–145)

## 2021-01-14 LAB — CBC
HCT: 26.2 % — ABNORMAL LOW (ref 39.0–52.0)
Hemoglobin: 8.4 g/dL — ABNORMAL LOW (ref 13.0–17.0)
MCH: 30.8 pg (ref 26.0–34.0)
MCHC: 32.1 g/dL (ref 30.0–36.0)
MCV: 96 fL (ref 80.0–100.0)
Platelets: 465 10*3/uL — ABNORMAL HIGH (ref 150–400)
RBC: 2.73 MIL/uL — ABNORMAL LOW (ref 4.22–5.81)
RDW: 16.9 % — ABNORMAL HIGH (ref 11.5–15.5)
WBC: 16.8 10*3/uL — ABNORMAL HIGH (ref 4.0–10.5)
nRBC: 0 % (ref 0.0–0.2)

## 2021-01-14 LAB — VANCOMYCIN, TROUGH: Vancomycin Tr: 15 ug/mL (ref 15–20)

## 2021-01-14 LAB — CULTURE, BLOOD (ROUTINE X 2): Culture: NO GROWTH

## 2021-01-14 NOTE — Progress Notes (Signed)
[facility]  ROUNDING NOTE   Subjective:  Patient laying in bed this AM. Having myoclonic facial twitches.    Objective:  Vital signs in last 24 hours:  Temperature 97.9 pulse 82 respirations 20 blood pressure 148/70 Physical Exam: General:  No acute distress  Head:  Normocephalic, atraumatic. 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  trace right lower extremity edema  Neurologic:  Awake, facial myoclonic twitches  Skin:  No acute rash  Access:  Right IJ temporary dialysis catheter    Basic Metabolic Panel: Recent Labs  Lab 01/09/21 1049 01/09/21 2017 01/10/21 0556 01/12/21 0442 01/14/21 0632  NA  --   --  133* 138 137  K 3.3* 4.6 3.6 3.6 5.0  CL  --   --  96* 101 100  CO2  --   --  23 25 23   GLUCOSE  --   --  143* 147* 133*  BUN  --   --  102* 44* 43*  CREATININE  --   --  3.99* 2.45* 3.30*  CALCIUM  --   --  8.7* 9.1 9.4  PHOS  --   --  3.5 1.4* 6.5*     Liver Function Tests: Recent Labs  Lab 01/10/21 0556 01/12/21 0442 01/14/21 0632  ALBUMIN 1.7* 1.9* 1.9*    No results for input(s): LIPASE, AMYLASE in the last 168 hours. No results for input(s): AMMONIA in the last 168 hours.  CBC: Recent Labs  Lab 01/09/21 0502 01/10/21 0556 01/12/21 0442 01/14/21 0632  WBC  --  18.9* 18.5* 16.8*  HGB 8.2* 7.9* 8.2* 8.4*  HCT 24.9* 23.9* 25.1* 26.2*  MCV  --  93.4 94.0 96.0  PLT  --  392 505* 465*     Cardiac Enzymes: No results for input(s): CKTOTAL, CKMB, CKMBINDEX, TROPONINI in the last 168 hours.  BNP: Invalid input(s): POCBNP  CBG: No results for input(s): GLUCAP in the last 168 hours.  Microbiology: Results for orders placed or performed during the hospital encounter of 11/21/20  Culture, blood (routine x 2)     Status: None   Collection Time: 11/25/20   3:25 PM   Specimen: BLOOD RIGHT HAND  Result Value Ref Range Status   Specimen Description BLOOD RIGHT HAND  Final   Special Requests   Final    BOTTLES DRAWN AEROBIC ONLY Blood Culture results may not be optimal due to an inadequate volume of blood received in culture bottles   Culture   Final    NO GROWTH 5 DAYS Performed at [facility], [address] [place], [place]    Report Status 11/30/2020 FINAL  Final  Culture, blood (routine x 2)     Status: None   Collection Time: 11/25/20  3:29 PM   Specimen: BLOOD LEFT HAND  Result Value Ref Range Status   Specimen Description BLOOD LEFT HAND  Final   Special Requests   Final    BOTTLES DRAWN AEROBIC AND ANAEROBIC Blood Culture adequate volume   Culture   Final    NO GROWTH 5 DAYS Performed at [facility], [address] [place], [place]    Report Status 11/30/2020 FINAL  Final  Culture, Respiratory w Gram Stain     Status: None  Collection Time: 12/06/20 10:40 AM   Specimen: Tracheal Aspirate; Respiratory  Result Value Ref Range Status   Specimen Description TRACHEAL ASPIRATE  Final   Special Requests NONE  Final   Gram Stain   Final    ABUNDANT WBC PRESENT, PREDOMINANTLY PMN ABUNDANT GRAM NEGATIVE RODS FEW GRAM POSITIVE COCCI FEW GRAM POSITIVE RODS    Culture   Final    MODERATE PSEUDOMONAS AERUGINOSA Two isolates with different morphologies were identified as the same organism.The most resistant organism was reported. Performed at [facility], [address] [place], [place] [address]    Report Status 12/09/2020 FINAL  Final   Organism ID, Bacteria PSEUDOMONAS AERUGINOSA  Final      Susceptibility   Pseudomonas aeruginosa - MIC*    CEFTAZIDIME 16 INTERMEDIATE Intermediate     CIPROFLOXACIN 1 SENSITIVE Sensitive     GENTAMICIN 2 SENSITIVE Sensitive     IMIPENEM >=16 RESISTANT Resistant     * MODERATE PSEUDOMONAS AERUGINOSA  Culture, blood (routine x 2)     Status: None    Collection Time: 12/13/20  9:09 AM   Specimen: BLOOD LEFT HAND  Result Value Ref Range Status   Specimen Description BLOOD LEFT HAND  Final   Special Requests   Final    BOTTLES DRAWN AEROBIC AND ANAEROBIC Blood Culture results may not be optimal due to an inadequate volume of blood received in culture bottles   Culture   Final    NO GROWTH 5 DAYS Performed at [facility], [address] [place], [place] [address]    Report Status 12/18/2020 FINAL  Final  Culture, blood (routine x 2)     Status: Abnormal   Collection Time: 12/13/20  9:13 AM   Specimen: BLOOD  Result Value Ref Range Status   Specimen Description BLOOD RIGHT ANTECUBITAL  Final   Special Requests   Final    BOTTLES DRAWN AEROBIC AND ANAEROBIC Blood Culture adequate volume   Culture  Setup Time   Final    GRAM POSITIVE COCCI IN CLUSTERS AEROBIC BOTTLE ONLY CRITICAL RESULT CALLED TO, READ BACK BY AND VERIFIED WITH: RN C.ROBENSON AT 1234 ON 12/14/2020 BY T.SAAD.    Culture (A)  Final    STAPHYLOCOCCUS EPIDERMIDIS THE SIGNIFICANCE OF ISOLATING THIS ORGANISM FROM A SINGLE SET OF BLOOD CULTURES WHEN MULTIPLE SETS ARE DRAWN IS UNCERTAIN. PLEASE NOTIFY THE MICROBIOLOGY DEPARTMENT WITHIN ONE WEEK IF SPECIATION AND SENSITIVITIES ARE REQUIRED. Performed at [facility], [address] [place], [place] [address]    Report Status 12/16/2020 FINAL  Final  Blood Culture ID Panel (Reflexed)     Status: Abnormal   Collection Time: 12/13/20  9:13 AM  Result Value Ref Range Status   Enterococcus faecalis NOT DETECTED NOT DETECTED Final   Enterococcus Faecium NOT DETECTED NOT DETECTED Final   Listeria monocytogenes NOT DETECTED NOT DETECTED Final   Staphylococcus species DETECTED (A) NOT DETECTED Final    Comment: CRITICAL RESULT CALLED TO, READ BACK BY AND VERIFIED WITH: RN C.ROBERTSON AT 1234 ON 12/14/2020 BY T.SAAD.    Staphylococcus aureus (BCID) NOT DETECTED NOT DETECTED Final   Staphylococcus epidermidis  DETECTED (A) NOT DETECTED Final    Comment: Methicillin (oxacillin) resistant coagulase negative staphylococcus. Possible blood culture contaminant (unless isolated from more than one blood culture draw or clinical case suggests pathogenicity). No antibiotic treatment is indicated for blood  culture contaminants. CRITICAL RESULT CALLED TO, READ BACK BY AND VERIFIED WITH: RN C.ROBERTSON AT  1234 ON 12/14/2020 BY T.SAAD.    Staphylococcus lugdunensis NOT DETECTED NOT DETECTED Final   Streptococcus species NOT DETECTED NOT DETECTED Final   Streptococcus agalactiae NOT DETECTED NOT DETECTED Final   Streptococcus pneumoniae NOT DETECTED NOT DETECTED Final   Streptococcus pyogenes NOT DETECTED NOT DETECTED Final   A.calcoaceticus-baumannii NOT DETECTED NOT DETECTED Final   Bacteroides fragilis NOT DETECTED NOT DETECTED Final   Enterobacterales NOT DETECTED NOT DETECTED Final   Enterobacter cloacae complex NOT DETECTED NOT DETECTED Final   Escherichia coli NOT DETECTED NOT DETECTED Final   Klebsiella aerogenes NOT DETECTED NOT DETECTED Final   Klebsiella oxytoca NOT DETECTED NOT DETECTED Final   Klebsiella pneumoniae NOT DETECTED NOT DETECTED Final   Proteus species NOT DETECTED NOT DETECTED Final   Salmonella species NOT DETECTED NOT DETECTED Final   Serratia marcescens NOT DETECTED NOT DETECTED Final   Haemophilus influenzae NOT DETECTED NOT DETECTED Final   Neisseria meningitidis NOT DETECTED NOT DETECTED Final   Pseudomonas aeruginosa NOT DETECTED NOT DETECTED Final   Stenotrophomonas maltophilia NOT DETECTED NOT DETECTED Final   Candida albicans NOT DETECTED NOT DETECTED Final   Candida auris NOT DETECTED NOT DETECTED Final   Candida glabrata NOT DETECTED NOT DETECTED Final   Candida krusei NOT DETECTED NOT DETECTED Final   Candida parapsilosis NOT DETECTED NOT DETECTED Final   Candida tropicalis NOT DETECTED NOT DETECTED Final   Cryptococcus neoformans/gattii NOT DETECTED NOT DETECTED  Final   Methicillin resistance mecA/C DETECTED (A) NOT DETECTED Final    Comment: CRITICAL RESULT CALLED TO, READ BACK BY AND VERIFIED WITH: RN C.ROBERTSON AT 1234 ON 12/14/2020 BY T.SAAD. Performed at [facility], [address], [place], [place] [address]   Culture, blood (routine x 2)     Status: None   Collection Time: 12/17/20 11:31 AM   Specimen: BLOOD  Result Value Ref Range Status   Specimen Description BLOOD RIGHT ANTECUBITAL  Final   Special Requests   Final    BOTTLES DRAWN AEROBIC ONLY Blood Culture results may not be optimal due to an inadequate volume of blood received in culture bottles   Culture   Final    NO GROWTH 5 DAYS Performed at [facility], [address] [place], [place] [address]    Report Status 12/22/2020 FINAL  Final  Culture, blood (routine x 2)     Status: None   Collection Time: 12/17/20 11:38 AM   Specimen: BLOOD LEFT HAND  Result Value Ref Range Status   Specimen Description BLOOD LEFT HAND  Final   Special Requests   Final    BOTTLES DRAWN AEROBIC ONLY Blood Culture adequate volume   Culture   Final    NO GROWTH 5 DAYS Performed at [facility], [address], [place], [place] [address]    Report Status 12/22/2020 FINAL  Final  Culture, blood (routine x 2)     Status: None   Collection Time: 12/29/20  8:10 AM   Specimen: BLOOD RIGHT HAND  Result Value Ref Range Status   Specimen Description BLOOD RIGHT HAND  Final   Special Requests   Final    BOTTLES DRAWN AEROBIC AND ANAEROBIC Blood Culture results may not be optimal due to an inadequate volume of blood received in culture bottles   Culture   Final    NO GROWTH 5 DAYS Performed at [facility], [address], [place], [place] [address]    Report Status 01/03/2021 FINAL  Final  Culture, blood (routine x 2)     Status: None   Collection Time: 12/29/20  8:17 AM   Specimen: BLOOD RIGHT HAND  Result Value Ref Range Status   Specimen Description BLOOD RIGHT HAND   Final   Special Requests   Final    BOTTLES DRAWN AEROBIC ONLY Blood Culture results may not be optimal due to an inadequate volume of blood received in culture bottles   Culture   Final    NO GROWTH 5 DAYS Performed at [facility], [address] [place], [place] [address]    Report Status 01/03/2021 FINAL  Final  Culture, Respiratory w Gram Stain     Status: None   Collection Time: 01/01/21 11:54 AM   Specimen: Tracheal Aspirate; Respiratory  Result Value Ref Range Status   Specimen Description TRACHEAL ASPIRATE  Final   Special Requests NONE  Final   Gram Stain   Final    RARE WBC PRESENT,BOTH PMN AND MONONUCLEAR FEW GRAM NEGATIVE RODS Performed at [facility], [address] [place], [place] [address]    Culture MODERATE PSEUDOMONAS AERUGINOSA  Final   Report Status 01/03/2021 FINAL  Final   Organism ID, Bacteria PSEUDOMONAS AERUGINOSA  Final      Susceptibility   Pseudomonas aeruginosa - MIC*    CEFTAZIDIME 4 SENSITIVE Sensitive     CIPROFLOXACIN 1 SENSITIVE Sensitive     GENTAMICIN <=1 SENSITIVE Sensitive     IMIPENEM 2 SENSITIVE Sensitive     PIP/TAZO <=4 SENSITIVE Sensitive     CEFEPIME 2 SENSITIVE Sensitive     * MODERATE PSEUDOMONAS AERUGINOSA  Culture, blood (routine x 2)     Status: None   Collection Time: 01/04/21 11:02 AM   Specimen: BLOOD RIGHT HAND  Result Value Ref Range Status   Specimen Description BLOOD RIGHT HAND  Final   Special Requests   Final    BOTTLES DRAWN AEROBIC AND ANAEROBIC Blood Culture adequate volume   Culture   Final    NO GROWTH 5 DAYS Performed at [facility], [address], [place], [place] [address]    Report Status 01/09/2021 FINAL  Final  Culture, blood (routine x 2)     Status: Abnormal   Collection Time: 01/04/21 11:11 AM   Specimen: BLOOD RIGHT FOREARM  Result Value Ref Range Status   Specimen Description BLOOD RIGHT FOREARM  Final   Special Requests   Final    BOTTLES DRAWN AEROBIC AND ANAEROBIC  Blood Culture results may not be optimal due to an inadequate volume of blood received in culture bottles   Culture  Setup Time   Final    GRAM POSITIVE COCCI IN CLUSTERS ANAEROBIC BOTTLE ONLY CRITICAL RESULT CALLED TO, READ BACK BY AND VERIFIED WITH: RN D [place] [id number] AT 1003 BY CM    Culture (A)  Final    STAPHYLOCOCCUS EPIDERMIDIS THE SIGNIFICANCE OF ISOLATING THIS ORGANISM FROM A SINGLE SET OF BLOOD CULTURES WHEN MULTIPLE SETS ARE DRAWN IS UNCERTAIN. PLEASE NOTIFY THE MICROBIOLOGY DEPARTMENT WITHIN ONE WEEK IF SPECIATION AND SENSITIVITIES ARE REQUIRED. Performed at [facility], [address], [place], [place] [address]    Report Status 01/07/2021 FINAL  Final  Culture, blood (routine x 2)     Status: None   Collection Time: 01/09/21  5:00 PM   Specimen: BLOOD  Result Value Ref Range Status   Specimen Description BLOOD RIGHT ANTECUBITAL  Final   Special Requests  Final    BOTTLES DRAWN AEROBIC AND ANAEROBIC Blood Culture results may not be optimal due to an inadequate volume of blood received in culture bottles   Culture   Final    NO GROWTH 5 DAYS Performed at [facility], [address], [place], [place] [address]    Report Status 01/14/2021 FINAL  Final  Culture, blood (routine x 2)     Status: Abnormal   Collection Time: 01/09/21  5:04 PM   Specimen: BLOOD  Result Value Ref Range Status   Specimen Description BLOOD RIGHT ANTECUBITAL  Final   Special Requests   Final    BOTTLES DRAWN AEROBIC ONLY Blood Culture results may not be optimal due to an inadequate volume of blood received in culture bottles   Culture  Setup Time   Final    GRAM POSITIVE COCCI IN CLUSTERS AEROBIC BOTTLE ONLY CRITICAL VALUE NOTED.  VALUE IS CONSISTENT WITH PREVIOUSLY REPORTED AND CALLED VALUE.    Culture (A)  Final    STAPHYLOCOCCUS CAPITIS THE SIGNIFICANCE OF ISOLATING THIS ORGANISM FROM A SINGLE SET OF BLOOD CULTURES WHEN MULTIPLE SETS ARE DRAWN IS UNCERTAIN. PLEASE NOTIFY  THE MICROBIOLOGY DEPARTMENT WITHIN ONE WEEK IF SPECIATION AND SENSITIVITIES ARE REQUIRED. Performed at [facility], [address], [place], [place] [address]    Report Status 01/11/2021 FINAL  Final    Coagulation Studies: No results for input(s): LABPROT, INR in the last 72 hours.  Urinalysis: No results for input(s): COLORURINE, LABSPEC, PHURINE, GLUCOSEU, HGBUR, BILIRUBINUR, KETONESUR, PROTEINUR, UROBILINOGEN, NITRITE, LEUKOCYTESUR in the last 72 hours.  Invalid input(s): APPERANCEUR    Imaging: No results found.   Medications:       Assessment/ Plan:  65 y.o. male with a PMHx of ESRD on HD TTS, anemia of chronic kidney disease, hypertension, acute respiratory failure status post tracheostomy placement, seizure disorder, hyperlipidemia, diabetes mellitus type 2, history of hyperkalemia, history of coronary artery disease, peripheral vascular disease who was admitted to [facility] on 11/21/2020 for ongoing treatment of acute respiratory failure status post tracheostomy placement and ESRD.   1.  ESRD on HD.    Patient due for hemodialysis treatment today.  Orders have been prepared.  2.  Acute respiratory failure.  Patient respiratory status appears to be stable via tracheostomy.  3.  Anemia of chronic kidney disease.  Hemoglobin improved.  Maintain the patient on current dosage of Retacrit.  4.  Secondary hyperparathyroidism.  Phosphorus currently up to 6.5 after repletion.  Previously phosphorus was 1.4.  5.  Fever/sepsis.  Temperature this a.m. is 97.9.  Prior PermCath was removed.  Await further input from ID.   LOS: 0 Wynetta Seith 7/8/20229:16 AM

## 2021-01-16 DIAGNOSIS — G931 Anoxic brain damage, not elsewhere classified: Secondary | ICD-10-CM | POA: Diagnosis not present

## 2021-01-16 DIAGNOSIS — N179 Acute kidney failure, unspecified: Secondary | ICD-10-CM | POA: Diagnosis not present

## 2021-01-16 DIAGNOSIS — J9621 Acute and chronic respiratory failure with hypoxia: Secondary | ICD-10-CM | POA: Diagnosis not present

## 2021-01-16 DIAGNOSIS — J151 Pneumonia due to Pseudomonas: Secondary | ICD-10-CM | POA: Diagnosis not present

## 2021-01-16 NOTE — Progress Notes (Signed)
Pulmonary Critical Care Medicine [facility] [place]   PULMONARY CRITICAL CARE SERVICE  PROGRESS NOTE     Cadden Elizondo  JWJ:[id number]  DOB: Jan 27, 1956   DOA: 11/21/2020  Referring Physician: Carron Curie, MD  HPI: Tajay Muzzy is a 65 y.o. male being followed for ventilator/airway/oxygen weaning Acute on Chronic Respiratory Failure.  Patient is on T collar currently on 28% FiO2 at baseline  Medications: Reviewed on Rounds  Physical Exam:  Vitals: Temperature is 98.9 pulse 96 respiratory 24 blood pressure is 127/63 saturations 99%  Ventilator Settings patient is off the ventilator currently on T collar  General: Comfortable at this time Neck: supple Cardiovascular: no malignant arrhythmias Respiratory: No rhonchi very coarse breath sounds Skin: no rash seen on limited exam Musculoskeletal: No gross abnormality Psychiatric:unable to assess Neurologic:no involuntary movements         Lab Data:   Basic Metabolic Panel: Recent Labs  Lab 01/10/21 0556 01/12/21 0442 01/14/21 0632  NA 133* 138 137  K 3.6 3.6 5.0  CL 96* 101 100  CO2 23 25 23   GLUCOSE 143* 147* 133*  BUN 102* 44* 43*  CREATININE 3.99* 2.45* 3.30*  CALCIUM 8.7* 9.1 9.4  PHOS 3.5 1.4* 6.5*    ABG: No results for input(s): PHART, PCO2ART, PO2ART, HCO3, O2SAT in the last 168 hours.  Liver Function Tests: Recent Labs  Lab 01/10/21 0556 01/12/21 0442 01/14/21 03/17/21  ALBUMIN 1.7* 1.9* 1.9*   No results for input(s): LIPASE, AMYLASE in the last 168 hours. No results for input(s): AMMONIA in the last 168 hours.  CBC: Recent Labs  Lab 01/10/21 0556 01/12/21 0442 01/14/21 0632  WBC 18.9* 18.5* 16.8*  HGB 7.9* 8.2* 8.4*  HCT 23.9* 25.1* 26.2*  MCV 93.4 94.0 96.0  PLT 392 505* 465*    Cardiac Enzymes: No results for input(s): CKTOTAL, CKMB, CKMBINDEX, TROPONINI in the last 168 hours.  BNP (last 3 results) No results for input(s): BNP in the last 8760  hours.  ProBNP (last 3 results) No results for input(s): PROBNP in the last 8760 hours.  Radiological Exams: No results found.  Assessment/Plan Active Problems:   Acute on chronic respiratory failure with hypoxia (HCC)   Anoxic brain injury (HCC)   Acute renal failure superimposed on chronic kidney disease (HCC)   Pneumonia due to Pseudomonas (HCC)   Acute on chronic respiratory failure hypoxia we will continue with T collar titrate the oxygen continue pulmonary toilet Anoxic brain injury no change we will continue to follow along closely. Acute renal failure on chronic renal failure supportive care we will continue present management Pneumonia due to Pseudomonas treated   I have personally seen and evaluated the patient, evaluated laboratory and imaging results, formulated the assessment and plan and placed orders. The Patient requires high complexity decision making with multiple systems involvement.  Rounds were done with the Respiratory Therapy Director and Staff therapists and discussed with nursing staff also.  03/17/21, MD [facility] Pulmonary Critical Care Medicine Sleep Medicine

## 2021-01-16 NOTE — Progress Notes (Signed)
Pulmonary Critical Care Medicine [facility] [place]   PULMONARY CRITICAL CARE SERVICE  PROGRESS NOTE     Nashua Homewood  QWQ:[id number]  DOB: [birth year]   DOA: 11/21/2020  Referring Physician: Carron Curie, MD  HPI: Rollo Farquhar is a 65 y.o. male being followed for ventilator/airway/oxygen weaning Acute on Chronic Respiratory Failure.  Patient currently is on T collar has been on 28% FiO2 with good saturations.  Medications: Reviewed on Rounds  Physical Exam:  Vitals: Temperature is 97.9 pulse 87 respiratory 20 blood pressure is 148/70 saturations 99%  Ventilator Settings patient is currently on T collar on 28% at baseline  General: Comfortable at this time Neck: supple Cardiovascular: no malignant arrhythmias Respiratory: No rhonchi very coarse breath Skin: no rash seen on limited exam Musculoskeletal: No gross abnormality Psychiatric:unable to assess Neurologic:no involuntary movements         Lab Data:   Basic Metabolic Panel: Recent Labs  Lab 01/09/21 2017 01/10/21 0556 01/12/21 0442 01/14/21 0632  NA  --  133* 138 137  K 4.6 3.6 3.6 5.0  CL  --  96* 101 100  CO2  --  23 25 23   GLUCOSE  --  143* 147* 133*  BUN  --  102* 44* 43*  CREATININE  --  3.99* 2.45* 3.30*  CALCIUM  --  8.7* 9.1 9.4  PHOS  --  3.5 1.4* 6.5*    ABG: No results for input(s): PHART, PCO2ART, PO2ART, HCO3, O2SAT in the last 168 hours.  Liver Function Tests: Recent Labs  Lab 01/10/21 0556 01/12/21 0442 01/14/21 03/17/21  ALBUMIN 1.7* 1.9* 1.9*   No results for input(s): LIPASE, AMYLASE in the last 168 hours. No results for input(s): AMMONIA in the last 168 hours.  CBC: Recent Labs  Lab 01/10/21 0556 01/12/21 0442 01/14/21 0632  WBC 18.9* 18.5* 16.8*  HGB 7.9* 8.2* 8.4*  HCT 23.9* 25.1* 26.2*  MCV 93.4 94.0 96.0  PLT 392 505* 465*    Cardiac Enzymes: No results for input(s): CKTOTAL, CKMB, CKMBINDEX, TROPONINI in the last 168 hours.  BNP  (last 3 results) No results for input(s): BNP in the last 8760 hours.  ProBNP (last 3 results) No results for input(s): PROBNP in the last 8760 hours.  Radiological Exams: No results found.  Assessment/Plan Active Problems:   Acute on chronic respiratory failure with hypoxia (HCC)   Anoxic brain injury (HCC)   Acute renal failure superimposed on chronic kidney disease (HCC)   Pneumonia due to Pseudomonas (HCC)   Acute on chronic respiratory failure hypoxia on T collar patient is on 20% FiO2 we will continue Anoxic brain injury no change no improvement Acute renal failure on chronic renal failure supportive care Pneumonia due to Pseudomonas we will continue to monitor closely   I have personally seen and evaluated the patient, evaluated laboratory and imaging results, formulated the assessment and plan and placed orders. The Patient requires high complexity decision making with multiple systems involvement.  Rounds were done with the Respiratory Therapy Director and Staff therapists and discussed with nursing staff also.  03/17/21, MD [facility] Pulmonary Critical Care Medicine Sleep Medicine

## 2021-01-17 DIAGNOSIS — G931 Anoxic brain damage, not elsewhere classified: Secondary | ICD-10-CM | POA: Diagnosis not present

## 2021-01-17 DIAGNOSIS — J151 Pneumonia due to Pseudomonas: Secondary | ICD-10-CM | POA: Diagnosis not present

## 2021-01-17 DIAGNOSIS — N179 Acute kidney failure, unspecified: Secondary | ICD-10-CM | POA: Diagnosis not present

## 2021-01-17 DIAGNOSIS — J9621 Acute and chronic respiratory failure with hypoxia: Secondary | ICD-10-CM | POA: Diagnosis not present

## 2021-01-17 LAB — CBC
HCT: 24 % — ABNORMAL LOW (ref 39.0–52.0)
Hemoglobin: 7.8 g/dL — ABNORMAL LOW (ref 13.0–17.0)
MCH: 30.5 pg (ref 26.0–34.0)
MCHC: 32.5 g/dL (ref 30.0–36.0)
MCV: 93.8 fL (ref 80.0–100.0)
Platelets: 538 10*3/uL — ABNORMAL HIGH (ref 150–400)
RBC: 2.56 MIL/uL — ABNORMAL LOW (ref 4.22–5.81)
RDW: 16.4 % — ABNORMAL HIGH (ref 11.5–15.5)
WBC: 19.3 10*3/uL — ABNORMAL HIGH (ref 4.0–10.5)
nRBC: 0 % (ref 0.0–0.2)

## 2021-01-17 LAB — RENAL FUNCTION PANEL
Albumin: 2 g/dL — ABNORMAL LOW (ref 3.5–5.0)
Anion gap: 14 (ref 5–15)
BUN: 79 mg/dL — ABNORMAL HIGH (ref 8–23)
CO2: 24 mmol/L (ref 22–32)
Calcium: 9.4 mg/dL (ref 8.9–10.3)
Chloride: 91 mmol/L — ABNORMAL LOW (ref 98–111)
Creatinine, Ser: 4.19 mg/dL — ABNORMAL HIGH (ref 0.61–1.24)
GFR, Estimated: 15 mL/min — ABNORMAL LOW (ref >60)
Glucose, Bld: 176 mg/dL — ABNORMAL HIGH (ref 70–99)
Phosphorus: 5.4 mg/dL — ABNORMAL HIGH (ref 2.5–4.6)
Potassium: 4.6 mmol/L (ref 3.5–5.1)
Sodium: 129 mmol/L — ABNORMAL LOW (ref 135–145)

## 2021-01-17 NOTE — Progress Notes (Signed)
Pulmonary Critical Care Medicine [facility] [place]   PULMONARY CRITICAL CARE SERVICE  PROGRESS NOTE     Joel Hebert  ZDG:[id number]  DOB: Jan 26, 1956   DOA: 11/21/2020  Referring Physician: Carron Curie, MD  HPI: Joel Hebert is a 65 y.o. male being followed for ventilator/airway/oxygen weaning Acute on Chronic Respiratory Failure.  Patient currently is on T collar has been on 28% FiO2 saturations are good  Medications: Reviewed on Rounds  Physical Exam:  Vitals: Temperature is 98.4 pulse 97 respiratory 24 blood pressure is 127/51 saturations 98%  Ventilator Settings on T collar FiO2 28%  General: Comfortable at this time Neck: supple Cardiovascular: no malignant arrhythmias Respiratory: No rhonchi no rales are noted at this time Skin: no rash seen on limited exam Musculoskeletal: No gross abnormality Psychiatric:unable to assess Neurologic:no involuntary movements         Lab Data:   Basic Metabolic Panel: Recent Labs  Lab 01/12/21 0442 01/14/21 0632 01/17/21 0702  NA 138 137 129*  K 3.6 5.0 4.6  CL 101 100 91*  CO2 25 23 24   GLUCOSE 147* 133* 176*  BUN 44* 43* 79*  CREATININE 2.45* 3.30* 4.19*  CALCIUM 9.1 9.4 9.4  PHOS 1.4* 6.5* 5.4*    ABG: No results for input(s): PHART, PCO2ART, PO2ART, HCO3, O2SAT in the last 168 hours.  Liver Function Tests: Recent Labs  Lab 01/12/21 0442 01/14/21 0632 01/17/21 0702  ALBUMIN 1.9* 1.9* 2.0*   No results for input(s): LIPASE, AMYLASE in the last 168 hours. No results for input(s): AMMONIA in the last 168 hours.  CBC: Recent Labs  Lab 01/12/21 0442 01/14/21 0632 01/17/21 0702  WBC 18.5* 16.8* 19.3*  HGB 8.2* 8.4* 7.8*  HCT 25.1* 26.2* 24.0*  MCV 94.0 96.0 93.8  PLT 505* 465* 538*    Cardiac Enzymes: No results for input(s): CKTOTAL, CKMB, CKMBINDEX, TROPONINI in the last 168 hours.  BNP (last 3 results) No results for input(s): BNP in the last 8760 hours.  ProBNP  (last 3 results) No results for input(s): PROBNP in the last 8760 hours.  Radiological Exams: No results found.  Assessment/Plan Active Problems:   Acute on chronic respiratory failure with hypoxia (HCC)   Anoxic brain injury (HCC)   Acute renal failure superimposed on chronic kidney disease (HCC)   Pneumonia due to Pseudomonas (HCC)   Chronic respiratory failure with hypoxia we will continue with T collar patient right now is on 28% FiO2.  We will continue secretion management supportive care.  Patient is at his baseline neurologically no change Anoxic brain injury as above neurologically there is been no improvement in the mental status Acute renal failure on chronic kidney disease supportive care we will continue to monitor Prior pseudomonal pneumonia treated we will continue to follow along closely.   I have personally seen and evaluated the patient, evaluated laboratory and imaging results, formulated the assessment and plan and placed orders. The Patient requires high complexity decision making with multiple systems involvement.  Rounds were done with the Respiratory Therapy Director and Staff therapists and discussed with nursing staff also.  03/20/21, MD [facility] Pulmonary Critical Care Medicine Sleep Medicine

## 2021-01-18 DIAGNOSIS — N179 Acute kidney failure, unspecified: Secondary | ICD-10-CM | POA: Diagnosis not present

## 2021-01-18 DIAGNOSIS — J9621 Acute and chronic respiratory failure with hypoxia: Secondary | ICD-10-CM | POA: Diagnosis not present

## 2021-01-18 DIAGNOSIS — G931 Anoxic brain damage, not elsewhere classified: Secondary | ICD-10-CM | POA: Diagnosis not present

## 2021-01-18 DIAGNOSIS — J151 Pneumonia due to Pseudomonas: Secondary | ICD-10-CM | POA: Diagnosis not present

## 2021-01-18 LAB — CULTURE, RESPIRATORY W GRAM STAIN

## 2021-01-18 NOTE — Progress Notes (Signed)
Pulmonary Critical Care Medicine [facility] [place]   PULMONARY CRITICAL CARE SERVICE  PROGRESS NOTE     Joel Hebert  RWE:[id number]  DOB: May 17, 1956   DOA: 11/21/2020  Referring Physician: Carron Curie, MD  HPI: Joel Hebert is a 65 y.o. male being followed for ventilator/airway/oxygen weaning Acute on Chronic Respiratory Failure.  Patient is on T collar currently on 28% FiO2  Medications: Reviewed on Rounds  Physical Exam:  Vitals: Temperature is 98.4 pulse 93 respiratory rate 19 blood pressure 129/54 saturations 99%  Ventilator Settings currently is on T collar has been on 28% FiO2  General: Comfortable at this time Neck: supple Cardiovascular: no malignant arrhythmias Respiratory: No rhonchi no rales are noted at this time Skin: no rash seen on limited exam Musculoskeletal: No gross abnormality Psychiatric:unable to assess Neurologic:no involuntary movements         Lab Data:   Basic Metabolic Panel: Recent Labs  Lab 01/12/21 0442 01/14/21 0632 01/17/21 0702  NA 138 137 129*  K 3.6 5.0 4.6  CL 101 100 91*  CO2 25 23 24   GLUCOSE 147* 133* 176*  BUN 44* 43* 79*  CREATININE 2.45* 3.30* 4.19*  CALCIUM 9.1 9.4 9.4  PHOS 1.4* 6.5* 5.4*    ABG: No results for input(s): PHART, PCO2ART, PO2ART, HCO3, O2SAT in the last 168 hours.  Liver Function Tests: Recent Labs  Lab 01/12/21 0442 01/14/21 0632 01/17/21 0702  ALBUMIN 1.9* 1.9* 2.0*   No results for input(s): LIPASE, AMYLASE in the last 168 hours. No results for input(s): AMMONIA in the last 168 hours.  CBC: Recent Labs  Lab 01/12/21 0442 01/14/21 0632 01/17/21 0702  WBC 18.5* 16.8* 19.3*  HGB 8.2* 8.4* 7.8*  HCT 25.1* 26.2* 24.0*  MCV 94.0 96.0 93.8  PLT 505* 465* 538*    Cardiac Enzymes: No results for input(s): CKTOTAL, CKMB, CKMBINDEX, TROPONINI in the last 168 hours.  BNP (last 3 results) No results for input(s): BNP in the last 8760 hours.  ProBNP  (last 3 results) No results for input(s): PROBNP in the last 8760 hours.  Radiological Exams: No results found.  Assessment/Plan Active Problems:   Acute on chronic respiratory failure with hypoxia (HCC)   Anoxic brain injury (HCC)   Acute renal failure superimposed on chronic kidney disease (HCC)   Pneumonia due to Pseudomonas (HCC)   Acute on chronic respiratory failure hypoxia we will continue with the T collar titrate oxygen as tolerated continue pulmonary toilet. Anoxic brain injury no change we will continue with supportive care Acute renal failure on chronic kidney disease supportive care we will continue to monitor Pneumonia due to Pseudomonas has been treated   I have personally seen and evaluated the patient, evaluated laboratory and imaging results, formulated the assessment and plan and placed orders. The Patient requires high complexity decision making with multiple systems involvement.  Rounds were done with the Respiratory Therapy Director and Staff therapists and discussed with nursing staff also.  03/20/21, MD [facility] Pulmonary Critical Care Medicine Sleep Medicine

## 2021-01-19 LAB — RENAL FUNCTION PANEL
Albumin: 1.9 g/dL — ABNORMAL LOW (ref 3.5–5.0)
Anion gap: 12 (ref 5–15)
BUN: 85 mg/dL — ABNORMAL HIGH (ref 8–23)
CO2: 26 mmol/L (ref 22–32)
Calcium: 9.7 mg/dL (ref 8.9–10.3)
Chloride: 95 mmol/L — ABNORMAL LOW (ref 98–111)
Creatinine, Ser: 3.56 mg/dL — ABNORMAL HIGH (ref 0.61–1.24)
GFR, Estimated: 18 mL/min — ABNORMAL LOW (ref >60)
Glucose, Bld: 155 mg/dL — ABNORMAL HIGH (ref 70–99)
Phosphorus: 3.6 mg/dL (ref 2.5–4.6)
Potassium: 4.6 mmol/L (ref 3.5–5.1)
Sodium: 133 mmol/L — ABNORMAL LOW (ref 135–145)

## 2021-01-19 LAB — CBC
HCT: 24 % — ABNORMAL LOW (ref 39.0–52.0)
Hemoglobin: 7.7 g/dL — ABNORMAL LOW (ref 13.0–17.0)
MCH: 30.4 pg (ref 26.0–34.0)
MCHC: 32.1 g/dL (ref 30.0–36.0)
MCV: 94.9 fL (ref 80.0–100.0)
Platelets: 487 10*3/uL — ABNORMAL HIGH (ref 150–400)
RBC: 2.53 MIL/uL — ABNORMAL LOW (ref 4.22–5.81)
RDW: 16.1 % — ABNORMAL HIGH (ref 11.5–15.5)
WBC: 13.8 10*3/uL — ABNORMAL HIGH (ref 4.0–10.5)
nRBC: 0 % (ref 0.0–0.2)

## 2021-01-19 LAB — HEPATITIS B SURFACE ANTIGEN: Hepatitis B Surface Ag: NONREACTIVE

## 2021-01-19 NOTE — Progress Notes (Signed)
[facility]  ROUNDING NOTE   Subjective:  Evaluated dialysis treatment today. Tolerating well.   Objective:  Vital signs in last 24 hours:  Temperature 98.2 pulse 94 respiration 17 blood pressure 146/66  Physical Exam: General:  No acute distress  Head:  Normocephalic, atraumatic. 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  trace right lower extremity edema  Neurologic:  Awake, facial myoclonic twitches  Skin:  No acute rash  Access:  Right IJ temporary dialysis catheter    Basic Metabolic Panel: Recent Labs  Lab 01/14/21 0632 01/17/21 0702 01/19/21 0538  NA 137 129* 133*  K 5.0 4.6 4.6  CL 100 91* 95*  CO2 23 24 26   GLUCOSE 133* 176* 155*  BUN 43* 79* 85*  CREATININE 3.30* 4.19* 3.56*  CALCIUM 9.4 9.4 9.7  PHOS 6.5* 5.4* 3.6     Liver Function Tests: Recent Labs  Lab 01/14/21 0632 01/17/21 0702 01/19/21 0538  ALBUMIN 1.9* 2.0* 1.9*    No results for input(s): LIPASE, AMYLASE in the last 168 hours. No results for input(s): AMMONIA in the last 168 hours.  CBC: Recent Labs  Lab 01/14/21 0632 01/17/21 0702 01/19/21 0538  WBC 16.8* 19.3* 13.8*  HGB 8.4* 7.8* 7.7*  HCT 26.2* 24.0* 24.0*  MCV 96.0 93.8 94.9  PLT 465* 538* 487*     Cardiac Enzymes: No results for input(s): CKTOTAL, CKMB, CKMBINDEX, TROPONINI in the last 168 hours.  BNP: Invalid input(s): POCBNP  CBG: No results for input(s): GLUCAP in the last 168 hours.  Microbiology: Results for orders placed or performed during the hospital encounter of 11/21/20  Culture, blood (routine x 2)     Status: None   Collection Time: 11/25/20  3:25 PM   Specimen: BLOOD RIGHT HAND  Result Value Ref Range Status   Specimen Description BLOOD RIGHT HAND  Final   Special Requests   Final    BOTTLES DRAWN AEROBIC  ONLY Blood Culture results may not be optimal due to an inadequate volume of blood received in culture bottles   Culture   Final    NO GROWTH 5 DAYS Performed at [facility], [address], [place], [place]    Report Status 11/30/2020 FINAL  Final  Culture, blood (routine x 2)     Status: None   Collection Time: 11/25/20  3:29 PM   Specimen: BLOOD LEFT HAND  Result Value Ref Range Status   Specimen Description BLOOD LEFT HAND  Final   Special Requests   Final    BOTTLES DRAWN AEROBIC AND ANAEROBIC Blood Culture adequate volume   Culture   Final    NO GROWTH 5 DAYS Performed at [facility], [address], [place], [place]    Report Status 11/30/2020 FINAL  Final  Culture, Respiratory w Gram Stain     Status: None   Collection Time: 12/06/20 10:40 AM   Specimen: Tracheal Aspirate; Respiratory  Result Value Ref Range Status   Specimen Description TRACHEAL ASPIRATE  Final   Special Requests NONE  Final   Gram Stain   Final    ABUNDANT WBC PRESENT, PREDOMINANTLY PMN ABUNDANT GRAM NEGATIVE RODS FEW GRAM POSITIVE COCCI FEW GRAM POSITIVE RODS    Culture   Final    MODERATE  PSEUDOMONAS AERUGINOSA Two isolates with different morphologies were identified as the same organism.The most resistant organism was reported. Performed at [facility], [address], [place], [place] [address]    Report Status 12/09/2020 FINAL  Final   Organism ID, Bacteria PSEUDOMONAS AERUGINOSA  Final      Susceptibility   Pseudomonas aeruginosa - MIC*    CEFTAZIDIME 16 INTERMEDIATE Intermediate     CIPROFLOXACIN 1 SENSITIVE Sensitive     GENTAMICIN 2 SENSITIVE Sensitive     IMIPENEM >=16 RESISTANT Resistant     * MODERATE PSEUDOMONAS AERUGINOSA  Culture, blood (routine x 2)     Status: None   Collection Time: 12/13/20  9:09 AM   Specimen: BLOOD LEFT HAND  Result Value Ref Range Status   Specimen Description BLOOD LEFT HAND  Final   Special Requests   Final     BOTTLES DRAWN AEROBIC AND ANAEROBIC Blood Culture results may not be optimal due to an inadequate volume of blood received in culture bottles   Culture   Final    NO GROWTH 5 DAYS Performed at [facility], [address], [place], [place] [address]    Report Status 12/18/2020 FINAL  Final  Culture, blood (routine x 2)     Status: Abnormal   Collection Time: 12/13/20  9:13 AM   Specimen: BLOOD  Result Value Ref Range Status   Specimen Description BLOOD RIGHT ANTECUBITAL  Final   Special Requests   Final    BOTTLES DRAWN AEROBIC AND ANAEROBIC Blood Culture adequate volume   Culture  Setup Time   Final    GRAM POSITIVE COCCI IN CLUSTERS AEROBIC BOTTLE ONLY CRITICAL RESULT CALLED TO, READ BACK BY AND VERIFIED WITH: RN C.ROBENSON AT 1234 ON 12/14/2020 BY T.SAAD.    Culture (A)  Final    STAPHYLOCOCCUS EPIDERMIDIS THE SIGNIFICANCE OF ISOLATING THIS ORGANISM FROM A SINGLE SET OF BLOOD CULTURES WHEN MULTIPLE SETS ARE DRAWN IS UNCERTAIN. PLEASE NOTIFY THE MICROBIOLOGY DEPARTMENT WITHIN ONE WEEK IF SPECIATION AND SENSITIVITIES ARE REQUIRED. Performed at [facility], [address] [place], [place] [address]    Report Status 12/16/2020 FINAL  Final  Blood Culture ID Panel (Reflexed)     Status: Abnormal   Collection Time: 12/13/20  9:13 AM  Result Value Ref Range Status   Enterococcus faecalis NOT DETECTED NOT DETECTED Final   Enterococcus Faecium NOT DETECTED NOT DETECTED Final   Listeria monocytogenes NOT DETECTED NOT DETECTED Final   Staphylococcus species DETECTED (A) NOT DETECTED Final    Comment: CRITICAL RESULT CALLED TO, READ BACK BY AND VERIFIED WITH: RN C.ROBERTSON AT 1234 ON 12/14/2020 BY T.SAAD.    Staphylococcus aureus (BCID) NOT DETECTED NOT DETECTED Final   Staphylococcus epidermidis DETECTED (A) NOT DETECTED Final    Comment: Methicillin (oxacillin) resistant coagulase negative staphylococcus. Possible blood culture contaminant (unless isolated from more than  one blood culture draw or clinical case suggests pathogenicity). No antibiotic treatment is indicated for blood  culture contaminants. CRITICAL RESULT CALLED TO, READ BACK BY AND VERIFIED WITH: RN C.ROBERTSON AT 1234 ON 12/14/2020 BY T.SAAD.    Staphylococcus lugdunensis NOT DETECTED NOT DETECTED Final   Streptococcus species NOT DETECTED NOT DETECTED Final   Streptococcus agalactiae NOT DETECTED NOT DETECTED Final   Streptococcus pneumoniae NOT DETECTED NOT DETECTED Final   Streptococcus pyogenes NOT DETECTED NOT DETECTED Final   A.calcoaceticus-baumannii NOT DETECTED NOT DETECTED Final   Bacteroides fragilis NOT DETECTED NOT DETECTED Final  Enterobacterales NOT DETECTED NOT DETECTED Final   Enterobacter cloacae complex NOT DETECTED NOT DETECTED Final   Escherichia coli NOT DETECTED NOT DETECTED Final   Klebsiella aerogenes NOT DETECTED NOT DETECTED Final   Klebsiella oxytoca NOT DETECTED NOT DETECTED Final   Klebsiella pneumoniae NOT DETECTED NOT DETECTED Final   Proteus species NOT DETECTED NOT DETECTED Final   Salmonella species NOT DETECTED NOT DETECTED Final   Serratia marcescens NOT DETECTED NOT DETECTED Final   Haemophilus influenzae NOT DETECTED NOT DETECTED Final   Neisseria meningitidis NOT DETECTED NOT DETECTED Final   Pseudomonas aeruginosa NOT DETECTED NOT DETECTED Final   Stenotrophomonas maltophilia NOT DETECTED NOT DETECTED Final   Candida albicans NOT DETECTED NOT DETECTED Final   Candida auris NOT DETECTED NOT DETECTED Final   Candida glabrata NOT DETECTED NOT DETECTED Final   Candida krusei NOT DETECTED NOT DETECTED Final   Candida parapsilosis NOT DETECTED NOT DETECTED Final   Candida tropicalis NOT DETECTED NOT DETECTED Final   Cryptococcus neoformans/gattii NOT DETECTED NOT DETECTED Final   Methicillin resistance mecA/C DETECTED (A) NOT DETECTED Final    Comment: CRITICAL RESULT CALLED TO, READ BACK BY AND VERIFIED WITH: RN C.ROBERTSON AT 1234 ON 12/14/2020  BY T.SAAD. Performed at [facility], [address] [place], [place] [address]   Culture, blood (routine x 2)     Status: None   Collection Time: 12/17/20 11:31 AM   Specimen: BLOOD  Result Value Ref Range Status   Specimen Description BLOOD RIGHT ANTECUBITAL  Final   Special Requests   Final    BOTTLES DRAWN AEROBIC ONLY Blood Culture results may not be optimal due to an inadequate volume of blood received in culture bottles   Culture   Final    NO GROWTH 5 DAYS Performed at [facility], [address], [place], [place] [address]    Report Status 12/22/2020 FINAL  Final  Culture, blood (routine x 2)     Status: None   Collection Time: 12/17/20 11:38 AM   Specimen: BLOOD LEFT HAND  Result Value Ref Range Status   Specimen Description BLOOD LEFT HAND  Final   Special Requests   Final    BOTTLES DRAWN AEROBIC ONLY Blood Culture adequate volume   Culture   Final    NO GROWTH 5 DAYS Performed at [facility], [address] [place], [place] [address]    Report Status 12/22/2020 FINAL  Final  Culture, blood (routine x 2)     Status: None   Collection Time: 12/29/20  8:10 AM   Specimen: BLOOD RIGHT HAND  Result Value Ref Range Status   Specimen Description BLOOD RIGHT HAND  Final   Special Requests   Final    BOTTLES DRAWN AEROBIC AND ANAEROBIC Blood Culture results may not be optimal due to an inadequate volume of blood received in culture bottles   Culture   Final    NO GROWTH 5 DAYS Performed at [facility], [address] [place], [place] [address]    Report Status 01/03/2021 FINAL  Final  Culture, blood (routine x 2)     Status: None   Collection Time: 12/29/20  8:17 AM   Specimen: BLOOD RIGHT HAND  Result Value Ref Range Status   Specimen Description BLOOD RIGHT HAND  Final   Special Requests   Final    BOTTLES DRAWN AEROBIC ONLY Blood Culture results may not be optimal due to an inadequate volume of  blood received in culture bottles    Culture   Final    NO GROWTH 5 DAYS Performed at [facility], [address], [place], [place] [address]    Report Status 01/03/2021 FINAL  Final  Culture, Respiratory w Gram Stain     Status: None   Collection Time: 01/01/21 11:54 AM   Specimen: Tracheal Aspirate; Respiratory  Result Value Ref Range Status   Specimen Description TRACHEAL ASPIRATE  Final   Special Requests NONE  Final   Gram Stain   Final    RARE WBC PRESENT,BOTH PMN AND MONONUCLEAR FEW GRAM NEGATIVE RODS Performed at [facility], [address] [place], [place] [address]    Culture MODERATE PSEUDOMONAS AERUGINOSA  Final   Report Status 01/03/2021 FINAL  Final   Organism ID, Bacteria PSEUDOMONAS AERUGINOSA  Final      Susceptibility   Pseudomonas aeruginosa - MIC*    CEFTAZIDIME 4 SENSITIVE Sensitive     CIPROFLOXACIN 1 SENSITIVE Sensitive     GENTAMICIN <=1 SENSITIVE Sensitive     IMIPENEM 2 SENSITIVE Sensitive     PIP/TAZO <=4 SENSITIVE Sensitive     CEFEPIME 2 SENSITIVE Sensitive     * MODERATE PSEUDOMONAS AERUGINOSA  Culture, blood (routine x 2)     Status: None   Collection Time: 01/04/21 11:02 AM   Specimen: BLOOD RIGHT HAND  Result Value Ref Range Status   Specimen Description BLOOD RIGHT HAND  Final   Special Requests   Final    BOTTLES DRAWN AEROBIC AND ANAEROBIC Blood Culture adequate volume   Culture   Final    NO GROWTH 5 DAYS Performed at [facility], [address], [place], [place] [address]    Report Status 01/09/2021 FINAL  Final  Culture, blood (routine x 2)     Status: Abnormal   Collection Time: 01/04/21 11:11 AM   Specimen: BLOOD RIGHT FOREARM  Result Value Ref Range Status   Specimen Description BLOOD RIGHT FOREARM  Final   Special Requests   Final    BOTTLES DRAWN AEROBIC AND ANAEROBIC Blood Culture results may not be optimal due to an inadequate volume of blood received in culture bottles   Culture  Setup Time   Final    GRAM POSITIVE COCCI IN  CLUSTERS ANAEROBIC BOTTLE ONLY CRITICAL RESULT CALLED TO, READ BACK BY AND VERIFIED WITH: RN D [place] [id number] AT 1003 BY CM    Culture (A)  Final    STAPHYLOCOCCUS EPIDERMIDIS THE SIGNIFICANCE OF ISOLATING THIS ORGANISM FROM A SINGLE SET OF BLOOD CULTURES WHEN MULTIPLE SETS ARE DRAWN IS UNCERTAIN. PLEASE NOTIFY THE MICROBIOLOGY DEPARTMENT WITHIN ONE WEEK IF SPECIATION AND SENSITIVITIES ARE REQUIRED. Performed at [facility], [address] [place], [place] [address]    Report Status 01/07/2021 FINAL  Final  Culture, blood (routine x 2)     Status: None   Collection Time: 01/09/21  5:00 PM   Specimen: BLOOD  Result Value Ref Range Status   Specimen Description BLOOD RIGHT ANTECUBITAL  Final   Special Requests   Final    BOTTLES DRAWN AEROBIC AND ANAEROBIC Blood Culture results may not be optimal due to an inadequate volume of blood received in culture bottles   Culture   Final    NO GROWTH 5 DAYS Performed at [facility], [address] [place], [place] [address]    Report Status 01/14/2021 FINAL  Final  Culture, blood (routine x 2)  Status: Abnormal   Collection Time: 01/09/21  5:04 PM   Specimen: BLOOD  Result Value Ref Range Status   Specimen Description BLOOD RIGHT ANTECUBITAL  Final   Special Requests   Final    BOTTLES DRAWN AEROBIC ONLY Blood Culture results may not be optimal due to an inadequate volume of blood received in culture bottles   Culture  Setup Time   Final    GRAM POSITIVE COCCI IN CLUSTERS AEROBIC BOTTLE ONLY CRITICAL VALUE NOTED.  VALUE IS CONSISTENT WITH PREVIOUSLY REPORTED AND CALLED VALUE.    Culture (A)  Final    STAPHYLOCOCCUS CAPITIS THE SIGNIFICANCE OF ISOLATING THIS ORGANISM FROM A SINGLE SET OF BLOOD CULTURES WHEN MULTIPLE SETS ARE DRAWN IS UNCERTAIN. PLEASE NOTIFY THE MICROBIOLOGY DEPARTMENT WITHIN ONE WEEK IF SPECIATION AND SENSITIVITIES ARE REQUIRED. Performed at [facility], [address] [place], [place]  [id number]    Report Status 01/11/2021 FINAL  Final  Culture, Respiratory w Gram Stain     Status: None   Collection Time: 01/14/21  6:04 PM   Specimen: Tracheal Aspirate; Respiratory  Result Value Ref Range Status   Specimen Description TRACHEAL ASPIRATE  Final   Special Requests NONE  Final   Gram Stain   Final    RARE WBC PRESENT,BOTH PMN AND MONONUCLEAR FEW SQUAMOUS EPITHELIAL CELLS PRESENT FEW GRAM NEGATIVE RODS RARE GRAM POSITIVE COCCI Performed at [facility], [address] [place], [place] [address]    Culture MODERATE PSEUDOMONAS AERUGINOSA  Final   Report Status 01/18/2021 FINAL  Final   Organism ID, Bacteria PSEUDOMONAS AERUGINOSA  Final      Susceptibility   Pseudomonas aeruginosa - MIC*    CEFTAZIDIME 4 SENSITIVE Sensitive     CIPROFLOXACIN 2 INTERMEDIATE Intermediate     GENTAMICIN 4 SENSITIVE Sensitive     IMIPENEM >=16 RESISTANT Resistant     CEFEPIME 4 SENSITIVE Sensitive     * MODERATE PSEUDOMONAS AERUGINOSA    Coagulation Studies: No results for input(s): LABPROT, INR in the last 72 hours.  Urinalysis: No results for input(s): COLORURINE, LABSPEC, PHURINE, GLUCOSEU, HGBUR, BILIRUBINUR, KETONESUR, PROTEINUR, UROBILINOGEN, NITRITE, LEUKOCYTESUR in the last 72 hours.  Invalid input(s): APPERANCEUR    Imaging: No results found.   Medications:       Assessment/ Plan:  65 y.o. male with a PMHx of ESRD on HD TTS, anemia of chronic kidney disease, hypertension, acute respiratory failure status post tracheostomy placement, seizure disorder, hyperlipidemia, diabetes mellitus type 2, history of hyperkalemia, history of coronary artery disease, peripheral vascular disease who was admitted to [facility] on 11/21/2020 for ongoing treatment of acute respiratory failure status post tracheostomy placement and ESRD.   1.  ESRD on HD.    Patient seen and evaluated during hemodialysis treatment.  Tolerating well.  We plan to complete dialysis treatment  today.  2.  Acute respiratory failure.  Continue to monitor respiratory status.  Doing well off the ventilator.  3.  Anemia of chronic kidney disease.  Continue Retacrit.  Hemoglobin currently 7.7.  4.  Secondary hyperparathyroidism.  Phosphorus remains within the normal range at 3.6.  Tube feeds were changed to a higher phosphorus formula.  5.  Fever/sepsis.  Management per hospitalist and infectious disease.   LOS: 0 Kearra Calkin 7/13/202211:01 AM

## 2021-01-20 NOTE — Progress Notes (Signed)
PROGRESS NOTE    Joel Hebert  BJS:[id number] DOB: 21-May-1956 DOA: 11/21/2020   Brief Narrative:  Kenwood Rosiak is an 65 y.o. 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  In the emergency room patient apparently was found to be agitated and combative, not following any commands.  He was recently admitted in the nursing home after being discharged from [facility] with right foot pain on 08/22/2020 with decreased circulation and underwent procedure to open his arteries on 09/03/2020.  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  He was transferred to the nursing home on cefepime 2 g every 8 hours with stop date on 10/23/2020.  He was admitted to the outside facility for altered mental status thought to be secondary to hypertensive encephalopathy with hypertensive emergency.  He was started on Cleviprex drip.  His troponin was elevated and he was started on heparin drip.  On 10/19/2020 rapid response was called for altered mental status and desaturation.  He had to be intubated due to worsening respiratory distress.  He was seen by nephrology for worsening azotemia during his hospital stay and was started on hemodialysis.  He was found to have heme positive stool and was transfused 1 unit PRBCs on 10/23/2020.  His blood pressures remained elevated.  He was started on isosorbide dinitrate drip.  On 10/27/2020 patient apparently self extubated, was transitioned to BiPAP.  However, he had a cord of unknown downtime with return of spontaneous circulation after 5 rounds of compressions, epinephrine x2.  He was found to be  actively seizing.  He was seen by neurology and started on valproic acid, Klonopin, fosphenytoin IV, Vimpat, phenobarbital.  He also received treatment with antibiotics with ceftazidime and IV Bactrim for pneumonia.  His sputum culture was positive for Pseudomonas and Klebsiella which was Carbapenem resistant.  Patient also received treatment with inhaled tobramycin.  Due to his complex medical problems he was transferred and admitted to [facility].  Patient continuing to have fever. He has HD catheter in place.  Blood cultures from 01/04/2021 showed staph epidermidis.  He also had blood cultures from 12/13/2020 which showed staph epidermidis.  He received treatment with multiple antibiotics.  He had catheter removed.  He again had blood cultures collected on 01/09/2021 which showed Staph capitis likely contaminant.  He completed treatment with IV vancomycin, cefepime.  However, respiratory cultures from 01/14/2021 now showing moderate Pseudomonas aeruginosa.     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  His previous respiratory culture showed Pseudomonas for which he received treatment with cefepime.  Currently high concern for systemic inflammatory response syndrome given the ongoing fevers and leukocytosis.  Subsequently again  treated with another round of IV vancomycin, cefepime.  Respiratory cultures from 01/14/2021 again showing  Pseudomonas.  Now started on ceftazidime.  Plan to treat for duration of 1 week pending improvement. Continue to monitor closely.  If his respiratory status worsens suggest repeat chest imaging preferably chest CT which can be done without contrast due to the acute on chronic renal failure.   Pneumonia: He has had pneumonia with respiratory cultures have previously showed Pseudomonas.  He already received treatment with multiple rounds of antibiotics.  Now Resperate cultures again showing Pseudomonas.  At this time recommend treatment with ceftazidime with a tentative duration of 1 week.  Unfortunately also has dysphagia secondary to anoxic brain injury and high risk for recurrent aspiration and aspiration pneumonia despite being on antibiotics. Again, as mentioned above he is high risk for worsening respiratory failure due to his aspiration despite being on antibiotics.  If his respiratory status worsens, suggest repeat chest imaging preferably chest CT.  Continue to monitor BUN/creatinine closely.  Adjust antibiotics accordingly.   Fever/leukocytosis: In the setting of systemic inflammatory response syndrome likely from the pneumonia.  He has received treatment with multiple rounds of antibiotics.  Now restarted on ceftazidime and secondary to respiratory cultures again showing Pseudomonas.  Continue to monitor closely.  If he is continuing to have fevers greater than 101 and worsening leukocytosis recommend to send for pancultures and repeat imaging as mentioned above.   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  Unfortunately he continues to remain unresponsive.  Continue medications per primary team.   Acute on chronic kidney disease: Patient on hemodialysis.  Nephrology following.  Antibiotics renally dosed.  Further management per primary team and nephrology.   Diabetic foot infection with gangrene: He is status post right  transmetatarsal amputation.  Continue local wound care.  He also has left BKA.  If wound worsening we may need to repeat imaging.  On empiric antibiotics as mentioned above.   Diabetes mellitus: Continue to monitor, medication and management of diabetes per primary team.   Dysphagia/protein calorie malnutrition: Management per the primary team.  Due to his dysphagia he is high risk for ongoing aspiration and recurrent aspiration pneumonia despite being on antibiotics.   Unfortunately due to his multiple complex medical problems he is very high risk for worsening and decompensation.  Plan of care discussed with the primary team and pharmacy.  Subjective: He remains encephalopathic.  Worsening leukocytosis.  Respiratory cultures again showing Pseudomonas.  Objective: Vitals: Temperature 99, pulse 104, respiratory 22, blood pressure 140/64, pulse oximetry 99%  Examination: Constitutional: Ill-appearing male, not following commands at this time. Head: Atraumatic, normocephalic Eyes: PERLA ENMT: external ears and nose appear normal, Lips appears normal, moist oral mucosa  Neck: Has trach in place CVS: S1-S2 Respiratory: Coarse breath sounds with rhonchi  Abdomen: Obese, soft, positive bowel sounds  Musculoskeletal: Left BKA, right TMA with dressing in place Neuro: He is not following any commands at this time.  Unable to do neurologic exam. Psych: Unable to assess at this time skin: no rashes    Data Reviewed: I have personally reviewed following labs and imaging studies  CBC: Recent Labs  Lab 01/14/21 0632 01/17/21 0702 01/19/21 0538  WBC 16.8* 19.3* 13.8*  HGB 8.4* 7.8* 7.7*  HCT 26.2* 24.0* 24.0*  MCV 96.0 93.8 94.9  PLT 465* 538* 487*    Basic Metabolic Panel: Recent Labs  Lab 01/14/21 0632 01/17/21 0702 01/19/21 6962  NA 137 129* 133*  K 5.0 4.6 4.6  CL 100 91* 95*  CO2 23 24 26   GLUCOSE 133* 176* 155*  BUN 43* 79* 85*  CREATININE 3.30* 4.19* 3.56*  CALCIUM  9.4 9.4 9.7  PHOS 6.5* 5.4* 3.6    GFR: CrCl cannot be calculated (Unknown ideal weight.).  Liver Function Tests: Recent Labs  Lab 01/14/21 03/17/21 01/17/21 0702 01/19/21 0538  ALBUMIN 1.9* 2.0* 1.9*    CBG: No results for input(s): GLUCAP in the last 168 hours.   Recent Results (from the past 240 hour(s))  Culture, Respiratory w Gram Stain     Status: None   Collection Time: 01/14/21  6:04 PM   Specimen: Tracheal Aspirate; Respiratory  Result Value Ref Range Status   Specimen Description TRACHEAL ASPIRATE  Final   Special Requests NONE  Final   Gram Stain   Final    RARE WBC PRESENT,BOTH PMN AND MONONUCLEAR FEW SQUAMOUS EPITHELIAL CELLS PRESENT FEW GRAM NEGATIVE RODS RARE GRAM POSITIVE COCCI Performed at [facility], [address], [place], [place]    Culture MODERATE PSEUDOMONAS AERUGINOSA  Final   Report Status 01/18/2021 FINAL  Final   Organism ID, Bacteria PSEUDOMONAS AERUGINOSA  Final      Susceptibility   Pseudomonas aeruginosa - MIC*    CEFTAZIDIME 4 SENSITIVE Sensitive     CIPROFLOXACIN 2 INTERMEDIATE Intermediate     GENTAMICIN 4 SENSITIVE Sensitive     IMIPENEM >=16 RESISTANT Resistant     CEFEPIME 4 SENSITIVE Sensitive     * MODERATE PSEUDOMONAS AERUGINOSA         Radiology Studies: No results found.  Scheduled Meds: Please see MAR   03/21/2021, MD   01/20/2021, 6:35 PM

## 2021-01-21 LAB — RENAL FUNCTION PANEL
Albumin: 2 g/dL — ABNORMAL LOW (ref 3.5–5.0)
Anion gap: 13 (ref 5–15)
BUN: 97 mg/dL — ABNORMAL HIGH (ref 8–23)
CO2: 25 mmol/L (ref 22–32)
Calcium: 9.8 mg/dL (ref 8.9–10.3)
Chloride: 92 mmol/L — ABNORMAL LOW (ref 98–111)
Creatinine, Ser: 3.5 mg/dL — ABNORMAL HIGH (ref 0.61–1.24)
GFR, Estimated: 18 mL/min — ABNORMAL LOW (ref >60)
Glucose, Bld: 164 mg/dL — ABNORMAL HIGH (ref 70–99)
Phosphorus: 3.1 mg/dL (ref 2.5–4.6)
Potassium: 5.1 mmol/L (ref 3.5–5.1)
Sodium: 130 mmol/L — ABNORMAL LOW (ref 135–145)

## 2021-01-21 LAB — CBC
HCT: 23.8 % — ABNORMAL LOW (ref 39.0–52.0)
Hemoglobin: 7.7 g/dL — ABNORMAL LOW (ref 13.0–17.0)
MCH: 30.8 pg (ref 26.0–34.0)
MCHC: 32.4 g/dL (ref 30.0–36.0)
MCV: 95.2 fL (ref 80.0–100.0)
Platelets: 468 10*3/uL — ABNORMAL HIGH (ref 150–400)
RBC: 2.5 MIL/uL — ABNORMAL LOW (ref 4.22–5.81)
RDW: 16.5 % — ABNORMAL HIGH (ref 11.5–15.5)
WBC: 13.2 10*3/uL — ABNORMAL HIGH (ref 4.0–10.5)
nRBC: 0 % (ref 0.0–0.2)

## 2021-01-21 NOTE — Progress Notes (Signed)
[name]  ROUNDING NOTE   Subjective:  Patient seen and evaluated during hemodialysis treatment. Circuit has clotted twice. Catheter not flowing very well.   Objective:  Vital signs in last 24 hours:  Temperature 98.2 pulse 96 respirations 13 blood pressure 130/63  Physical Exam: General:  No acute distress  Head:  Normocephalic, atraumatic. 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  trace right lower extremity edema  Neurologic:  Awake, facial myoclonic twitches  Skin:  No acute rash  Access:  Right IJ temporary dialysis catheter    Basic Metabolic Panel: Recent Labs  Lab 01/17/21 0702 01/19/21 0538 01/21/21 0505  NA 129* 133* 130*  K 4.6 4.6 5.1  CL 91* 95* 92*  CO2 24 26 25   GLUCOSE 176* 155* 164*  BUN 79* 85* 97*  CREATININE 4.19* 3.56* 3.50*  CALCIUM 9.4 9.7 9.8  PHOS 5.4* 3.6 3.1     Liver Function Tests: Recent Labs  Lab 01/17/21 0702 01/19/21 0538 01/21/21 0505  ALBUMIN 2.0* 1.9* 2.0*    No results for input(s): LIPASE, AMYLASE in the last 168 hours. No results for input(s): AMMONIA in the last 168 hours.  CBC: Recent Labs  Lab 01/17/21 0702 01/19/21 0538 01/21/21 0505  WBC 19.3* 13.8* 13.2*  HGB 7.8* 7.7* 7.7*  HCT 24.0* 24.0* 23.8*  MCV 93.8 94.9 95.2  PLT 538* 487* 468*     Cardiac Enzymes: No results for input(s): CKTOTAL, CKMB, CKMBINDEX, TROPONINI in the last 168 hours.  BNP: Invalid input(s): POCBNP  CBG: No results for input(s): GLUCAP in the last 168 hours.  Microbiology: Results for orders placed or performed during the hospital encounter of 11/21/20  Culture, blood (routine x 2)     Status: None   Collection Time: 11/25/20  3:25 PM   Specimen: BLOOD RIGHT HAND  Result Value Ref Range Status   Specimen Description BLOOD RIGHT  HAND  Final   Special Requests   Final    BOTTLES DRAWN AEROBIC ONLY Blood Culture results may not be optimal due to an inadequate volume of blood received in culture bottles   Culture   Final    NO GROWTH 5 DAYS Performed at [facility], [address] [place], [place] [address]    Report Status 11/30/2020 FINAL  Final  Culture, blood (routine x 2)     Status: None   Collection Time: 11/25/20  3:29 PM   Specimen: BLOOD LEFT HAND  Result Value Ref Range Status   Specimen Description BLOOD LEFT HAND  Final   Special Requests   Final    BOTTLES DRAWN AEROBIC AND ANAEROBIC Blood Culture adequate volume   Culture   Final    NO GROWTH 5 DAYS Performed at [facility], [address] [place], [place] [address]    Report Status 11/30/2020 FINAL  Final  Culture, Respiratory w Gram Stain     Status: None   Collection Time: 12/06/20 10:40 AM   Specimen: Tracheal Aspirate; Respiratory  Result Value Ref Range Status   Specimen Description TRACHEAL ASPIRATE  Final   Special Requests NONE  Final   Gram Stain   Final    ABUNDANT WBC PRESENT, PREDOMINANTLY PMN ABUNDANT GRAM NEGATIVE RODS FEW GRAM POSITIVE COCCI FEW GRAM POSITIVE RODS  Culture   Final    MODERATE PSEUDOMONAS AERUGINOSA Two isolates with different morphologies were identified as the same organism.The most resistant organism was reported. Performed at [facility], [address], [place], [place] [address]    Report Status 12/09/2020 FINAL  Final   Organism ID, Bacteria PSEUDOMONAS AERUGINOSA  Final      Susceptibility   Pseudomonas aeruginosa - MIC*    CEFTAZIDIME 16 INTERMEDIATE Intermediate     CIPROFLOXACIN 1 SENSITIVE Sensitive     GENTAMICIN 2 SENSITIVE Sensitive     IMIPENEM >=16 RESISTANT Resistant     * MODERATE PSEUDOMONAS AERUGINOSA  Culture, blood (routine x 2)     Status: None   Collection Time: 12/13/20  9:09 AM   Specimen: BLOOD LEFT HAND  Result Value Ref Range Status   Specimen  Description BLOOD LEFT HAND  Final   Special Requests   Final    BOTTLES DRAWN AEROBIC AND ANAEROBIC Blood Culture results may not be optimal due to an inadequate volume of blood received in culture bottles   Culture   Final    NO GROWTH 5 DAYS Performed at [facility], [address], [place], [place] [address]    Report Status 12/18/2020 FINAL  Final  Culture, blood (routine x 2)     Status: Abnormal   Collection Time: 12/13/20  9:13 AM   Specimen: BLOOD  Result Value Ref Range Status   Specimen Description BLOOD RIGHT ANTECUBITAL  Final   Special Requests   Final    BOTTLES DRAWN AEROBIC AND ANAEROBIC Blood Culture adequate volume   Culture  Setup Time   Final    GRAM POSITIVE COCCI IN CLUSTERS AEROBIC BOTTLE ONLY CRITICAL RESULT CALLED TO, READ BACK BY AND VERIFIED WITH: RN C.ROBENSON AT 1234 ON 12/14/2020 BY T.SAAD.    Culture (A)  Final    STAPHYLOCOCCUS EPIDERMIDIS THE SIGNIFICANCE OF ISOLATING THIS ORGANISM FROM A SINGLE SET OF BLOOD CULTURES WHEN MULTIPLE SETS ARE DRAWN IS UNCERTAIN. PLEASE NOTIFY THE MICROBIOLOGY DEPARTMENT WITHIN ONE WEEK IF SPECIATION AND SENSITIVITIES ARE REQUIRED. Performed at [facility], [address], [place], [place] [address]    Report Status 12/16/2020 FINAL  Final  Blood Culture ID Panel (Reflexed)     Status: Abnormal   Collection Time: 12/13/20  9:13 AM  Result Value Ref Range Status   Enterococcus faecalis NOT DETECTED NOT DETECTED Final   Enterococcus Faecium NOT DETECTED NOT DETECTED Final   Listeria monocytogenes NOT DETECTED NOT DETECTED Final   Staphylococcus species DETECTED (A) NOT DETECTED Final    Comment: CRITICAL RESULT CALLED TO, READ BACK BY AND VERIFIED WITH: RN C.ROBERTSON AT 1234 ON 12/14/2020 BY T.SAAD.    Staphylococcus aureus (BCID) NOT DETECTED NOT DETECTED Final   Staphylococcus epidermidis DETECTED (A) NOT DETECTED Final    Comment: Methicillin (oxacillin) resistant coagulase negative staphylococcus.  Possible blood culture contaminant (unless isolated from more than one blood culture draw or clinical case suggests pathogenicity). No antibiotic treatment is indicated for blood  culture contaminants. CRITICAL RESULT CALLED TO, READ BACK BY AND VERIFIED WITH: RN C.ROBERTSON AT 1234 ON 12/14/2020 BY T.SAAD.    Staphylococcus lugdunensis NOT DETECTED NOT DETECTED Final   Streptococcus species NOT DETECTED NOT DETECTED Final   Streptococcus agalactiae NOT DETECTED NOT DETECTED Final   Streptococcus pneumoniae NOT DETECTED NOT DETECTED Final   Streptococcus pyogenes NOT DETECTED NOT DETECTED Final   A.calcoaceticus-baumannii NOT DETECTED NOT DETECTED Final   Bacteroides  fragilis NOT DETECTED NOT DETECTED Final   Enterobacterales NOT DETECTED NOT DETECTED Final   Enterobacter cloacae complex NOT DETECTED NOT DETECTED Final   Escherichia coli NOT DETECTED NOT DETECTED Final   Klebsiella aerogenes NOT DETECTED NOT DETECTED Final   Klebsiella oxytoca NOT DETECTED NOT DETECTED Final   Klebsiella pneumoniae NOT DETECTED NOT DETECTED Final   Proteus species NOT DETECTED NOT DETECTED Final   Salmonella species NOT DETECTED NOT DETECTED Final   Serratia marcescens NOT DETECTED NOT DETECTED Final   Haemophilus influenzae NOT DETECTED NOT DETECTED Final   Neisseria meningitidis NOT DETECTED NOT DETECTED Final   Pseudomonas aeruginosa NOT DETECTED NOT DETECTED Final   Stenotrophomonas maltophilia NOT DETECTED NOT DETECTED Final   Candida albicans NOT DETECTED NOT DETECTED Final   Candida auris NOT DETECTED NOT DETECTED Final   Candida glabrata NOT DETECTED NOT DETECTED Final   Candida krusei NOT DETECTED NOT DETECTED Final   Candida parapsilosis NOT DETECTED NOT DETECTED Final   Candida tropicalis NOT DETECTED NOT DETECTED Final   Cryptococcus neoformans/gattii NOT DETECTED NOT DETECTED Final   Methicillin resistance mecA/C DETECTED (A) NOT DETECTED Final    Comment: CRITICAL RESULT CALLED TO, READ  BACK BY AND VERIFIED WITH: RN C.ROBERTSON AT 1234 ON 12/14/2020 BY T.SAAD. Performed at [facility], [address], [place], [place] [address]   Culture, blood (routine x 2)     Status: None   Collection Time: 12/17/20 11:31 AM   Specimen: BLOOD  Result Value Ref Range Status   Specimen Description BLOOD RIGHT ANTECUBITAL  Final   Special Requests   Final    BOTTLES DRAWN AEROBIC ONLY Blood Culture results may not be optimal due to an inadequate volume of blood received in culture bottles   Culture   Final    NO GROWTH 5 DAYS Performed at [facility], [address] [place], [place] [address]    Report Status 12/22/2020 FINAL  Final  Culture, blood (routine x 2)     Status: None   Collection Time: 12/17/20 11:38 AM   Specimen: BLOOD LEFT HAND  Result Value Ref Range Status   Specimen Description BLOOD LEFT HAND  Final   Special Requests   Final    BOTTLES DRAWN AEROBIC ONLY Blood Culture adequate volume   Culture   Final    NO GROWTH 5 DAYS Performed at [facility], [address], [place], [place] [address]    Report Status 12/22/2020 FINAL  Final  Culture, blood (routine x 2)     Status: None   Collection Time: 12/29/20  8:10 AM   Specimen: BLOOD RIGHT HAND  Result Value Ref Range Status   Specimen Description BLOOD RIGHT HAND  Final   Special Requests   Final    BOTTLES DRAWN AEROBIC AND ANAEROBIC Blood Culture results may not be optimal due to an inadequate volume of blood received in culture bottles   Culture   Final    NO GROWTH 5 DAYS Performed at [facility], [address] [place], [place] [address]    Report Status 01/03/2021 FINAL  Final  Culture, blood (routine x 2)     Status: None   Collection Time: 12/29/20  8:17 AM   Specimen: BLOOD RIGHT HAND  Result Value Ref Range Status   Specimen Description BLOOD RIGHT HAND  Final   Special Requests   Final    BOTTLES DRAWN AEROBIC ONLY Blood Culture results may not  be optimal due to  an inadequate volume of blood received in culture bottles   Culture   Final    NO GROWTH 5 DAYS Performed at [facility], [address] [place], [place] [address]    Report Status 01/03/2021 FINAL  Final  Culture, Respiratory w Gram Stain     Status: None   Collection Time: 01/01/21 11:54 AM   Specimen: Tracheal Aspirate; Respiratory  Result Value Ref Range Status   Specimen Description TRACHEAL ASPIRATE  Final   Special Requests NONE  Final   Gram Stain   Final    RARE WBC PRESENT,BOTH PMN AND MONONUCLEAR FEW GRAM NEGATIVE RODS Performed at [facility], [address], [place], [place] [address]    Culture MODERATE PSEUDOMONAS AERUGINOSA  Final   Report Status 01/03/2021 FINAL  Final   Organism ID, Bacteria PSEUDOMONAS AERUGINOSA  Final      Susceptibility   Pseudomonas aeruginosa - MIC*    CEFTAZIDIME 4 SENSITIVE Sensitive     CIPROFLOXACIN 1 SENSITIVE Sensitive     GENTAMICIN <=1 SENSITIVE Sensitive     IMIPENEM 2 SENSITIVE Sensitive     PIP/TAZO <=4 SENSITIVE Sensitive     CEFEPIME 2 SENSITIVE Sensitive     * MODERATE PSEUDOMONAS AERUGINOSA  Culture, blood (routine x 2)     Status: None   Collection Time: 01/04/21 11:02 AM   Specimen: BLOOD RIGHT HAND  Result Value Ref Range Status   Specimen Description BLOOD RIGHT HAND  Final   Special Requests   Final    BOTTLES DRAWN AEROBIC AND ANAEROBIC Blood Culture adequate volume   Culture   Final    NO GROWTH 5 DAYS Performed at [facility], [address], [place], [place] [address]    Report Status 01/09/2021 FINAL  Final  Culture, blood (routine x 2)     Status: Abnormal   Collection Time: 01/04/21 11:11 AM   Specimen: BLOOD RIGHT FOREARM  Result Value Ref Range Status   Specimen Description BLOOD RIGHT FOREARM  Final   Special Requests   Final    BOTTLES DRAWN AEROBIC AND ANAEROBIC Blood Culture results may not be optimal due to an inadequate volume of blood received in culture bottles   Culture   Setup Time   Final    GRAM POSITIVE COCCI IN CLUSTERS ANAEROBIC BOTTLE ONLY CRITICAL RESULT CALLED TO, READ BACK BY AND VERIFIED WITH: RN D [place] [id number] AT 1003 BY CM    Culture (A)  Final    STAPHYLOCOCCUS EPIDERMIDIS THE SIGNIFICANCE OF ISOLATING THIS ORGANISM FROM A SINGLE SET OF BLOOD CULTURES WHEN MULTIPLE SETS ARE DRAWN IS UNCERTAIN. PLEASE NOTIFY THE MICROBIOLOGY DEPARTMENT WITHIN ONE WEEK IF SPECIATION AND SENSITIVITIES ARE REQUIRED. Performed at [facility], [address], [place], [place] [address]    Report Status 01/07/2021 FINAL  Final  Culture, blood (routine x 2)     Status: None   Collection Time: 01/09/21  5:00 PM   Specimen: BLOOD  Result Value Ref Range Status   Specimen Description BLOOD RIGHT ANTECUBITAL  Final   Special Requests   Final    BOTTLES DRAWN AEROBIC AND ANAEROBIC Blood Culture results may not be optimal due to an inadequate volume of blood received in culture bottles   Culture   Final    NO GROWTH 5 DAYS Performed at [facility], [address] [place], [place] [address]    Report Status 01/14/2021 FINAL  Final  Culture, blood (routine x 2)     Status: Abnormal   Collection Time: 01/09/21  5:04 PM   Specimen: BLOOD  Result Value Ref Range Status   Specimen Description BLOOD RIGHT ANTECUBITAL  Final   Special Requests   Final    BOTTLES DRAWN AEROBIC ONLY Blood Culture results may not be optimal due to an inadequate volume of blood received in culture bottles   Culture  Setup Time   Final    GRAM POSITIVE COCCI IN CLUSTERS AEROBIC BOTTLE ONLY CRITICAL VALUE NOTED.  VALUE IS CONSISTENT WITH PREVIOUSLY REPORTED AND CALLED VALUE.    Culture (A)  Final    STAPHYLOCOCCUS CAPITIS THE SIGNIFICANCE OF ISOLATING THIS ORGANISM FROM A SINGLE SET OF BLOOD CULTURES WHEN MULTIPLE SETS ARE DRAWN IS UNCERTAIN. PLEASE NOTIFY THE MICROBIOLOGY DEPARTMENT WITHIN ONE WEEK IF SPECIATION AND SENSITIVITIES ARE REQUIRED. Performed at [facility], [address] [place], [place] [address]    Report Status 01/11/2021 FINAL  Final  Culture, Respiratory w Gram Stain     Status: None   Collection Time: 01/14/21  6:04 PM   Specimen: Tracheal Aspirate; Respiratory  Result Value Ref Range Status   Specimen Description TRACHEAL ASPIRATE  Final   Special Requests NONE  Final   Gram Stain   Final    RARE WBC PRESENT,BOTH PMN AND MONONUCLEAR FEW SQUAMOUS EPITHELIAL CELLS PRESENT FEW GRAM NEGATIVE RODS RARE GRAM POSITIVE COCCI Performed at [facility], [address] [place], [place] [address]    Culture MODERATE PSEUDOMONAS AERUGINOSA  Final   Report Status 01/18/2021 FINAL  Final   Organism ID, Bacteria PSEUDOMONAS AERUGINOSA  Final      Susceptibility   Pseudomonas aeruginosa - MIC*    CEFTAZIDIME 4 SENSITIVE Sensitive     CIPROFLOXACIN 2 INTERMEDIATE Intermediate     GENTAMICIN 4 SENSITIVE Sensitive     IMIPENEM >=16 RESISTANT Resistant     CEFEPIME 4 SENSITIVE Sensitive     * MODERATE PSEUDOMONAS AERUGINOSA    Coagulation Studies: No results for input(s): LABPROT, INR in the last 72 hours.  Urinalysis: No results for input(s): COLORURINE, LABSPEC, PHURINE, GLUCOSEU, HGBUR, BILIRUBINUR, KETONESUR, PROTEINUR, UROBILINOGEN, NITRITE, LEUKOCYTESUR in the last 72 hours.  Invalid input(s): APPERANCEUR    Imaging: No results found.   Medications:       Assessment/ Plan:  65 y.o. male with a PMHx of ESRD on HD TTS, anemia of chronic kidney disease, hypertension, acute respiratory failure status post tracheostomy placement, seizure disorder, hyperlipidemia, diabetes mellitus type 2, history of hyperkalemia, history of coronary artery disease, peripheral vascular disease who was admitted to [facility] on 11/21/2020 for ongoing treatment of acute respiratory failure status post tracheostomy placement and ESRD.   1.  ESRD on HD.    Patient seen during dialysis treatment this AM.  Catheter and circuit now  flowing well.  We instructed dialysis nurse to administer heparin 2000 units IV x1.  In addition if catheter continues to not feel well we have instructed dialysis nurse to administer Cathflo.  2.  Acute respiratory failure.  Continues to do well off the ventilator.  3.  Anemia of chronic kidney disease.  Hemoglobin remains low but stable at 7.7.  Maintain the patient on Retacrit.  Consider blood transfusion for hemoglobin of 7 or less.  4.  Secondary hyperparathyroidism.  Phosphorus at target at 3.1.  Continue to monitor.  5.  Fever/sepsis.  Management per hospitalist and infectious disease.  LOS: 0 Tobyn Osgood 7/15/202210:13 AM

## 2021-01-24 LAB — RENAL FUNCTION PANEL
Albumin: 1.9 g/dL — ABNORMAL LOW (ref 3.5–5.0)
Anion gap: 12 (ref 5–15)
BUN: 35 mg/dL — ABNORMAL HIGH (ref 8–23)
CO2: 26 mmol/L (ref 22–32)
Calcium: 8.6 mg/dL — ABNORMAL LOW (ref 8.9–10.3)
Chloride: 98 mmol/L (ref 98–111)
Creatinine, Ser: 1.55 mg/dL — ABNORMAL HIGH (ref 0.61–1.24)
GFR, Estimated: 49 mL/min — ABNORMAL LOW (ref >60)
Glucose, Bld: 126 mg/dL — ABNORMAL HIGH (ref 70–99)
Phosphorus: 1.4 mg/dL — ABNORMAL LOW (ref 2.5–4.6)
Potassium: 3.3 mmol/L — ABNORMAL LOW (ref 3.5–5.1)
Sodium: 136 mmol/L (ref 135–145)

## 2021-01-24 LAB — CBC
HCT: 23.3 % — ABNORMAL LOW (ref 39.0–52.0)
Hemoglobin: 7.4 g/dL — ABNORMAL LOW (ref 13.0–17.0)
MCH: 30.3 pg (ref 26.0–34.0)
MCHC: 31.8 g/dL (ref 30.0–36.0)
MCV: 95.5 fL (ref 80.0–100.0)
Platelets: 405 10*3/uL — ABNORMAL HIGH (ref 150–400)
RBC: 2.44 MIL/uL — ABNORMAL LOW (ref 4.22–5.81)
RDW: 16.8 % — ABNORMAL HIGH (ref 11.5–15.5)
WBC: 14.1 10*3/uL — ABNORMAL HIGH (ref 4.0–10.5)
nRBC: 0 % (ref 0.0–0.2)

## 2021-01-24 NOTE — Progress Notes (Signed)
[facility]  ROUNDING NOTE   Subjective:  Patient sitting up in chair this AM.  Breathing comfortably.    Objective:  Vital signs in last 24 hours:  Temperature 97.6 pulse 86 respirations 12 blood pressure 123/66  Physical Exam: General:  No acute distress  Head:  Normocephalic, atraumatic. 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  trace right lower extremity edema  Neurologic:  Awake, facial myoclonic twitches  Skin:  No acute rash  Access:  Right IJ temporary dialysis catheter    Basic Metabolic Panel: Recent Labs  Lab 01/19/21 0538 01/21/21 0505 01/24/21 0500  NA 133* 130* 136  K 4.6 5.1 3.3*  CL 95* 92* 98  CO2 GLUCOSE 155* 164* 126*  BUN 85* 97* 35*  CREATININE 3.56* 3.50* 1.55*  CALCIUM 9.7 9.8 8.6*  PHOS 3.6 3.1 1.4*     Liver Function Tests: Recent Labs  Lab 01/19/21 0538 01/21/21 0505 01/24/21 0500  ALBUMIN 1.9* 2.0* 1.9*    No results for input(s): LIPASE, AMYLASE in the last 168 hours. No results for input(s): AMMONIA in the last 168 hours.  CBC: Recent Labs  Lab 01/19/21 0538 01/21/21 0505 01/24/21 0500  WBC 13.8* 13.2* 14.1*  HGB 7.7* 7.7* 7.4*  HCT 24.0* 23.8* 23.3*  MCV 94.9 95.2 95.5  PLT 487* 468* 405*     Cardiac Enzymes: No results for input(s): CKTOTAL, CKMB, CKMBINDEX, TROPONINI in the last 168 hours.  BNP: Invalid input(s): POCBNP  CBG: No results for input(s): GLUCAP in the last 168 hours.  Microbiology: Results for orders placed or performed during the hospital encounter of 11/21/20  Culture, blood (routine x 2)     Status: None   Collection Time: 11/25/20  3:25 PM   Specimen: BLOOD RIGHT HAND  Result Value Ref Range Status   Specimen Description BLOOD RIGHT HAND  Final   Special Requests   Final    BOTTLES  DRAWN AEROBIC ONLY Blood Culture results may not be optimal due to an inadequate volume of blood received in culture bottles   Culture   Final    NO GROWTH 5 DAYS Performed at [facility], [address] [place], [place] [address]    Report Status 11/30/2020 FINAL  Final  Culture, blood (routine x 2)     Status: None   Collection Time: 11/25/20  3:29 PM   Specimen: BLOOD LEFT HAND  Result Value Ref Range Status   Specimen Description BLOOD LEFT HAND  Final   Special Requests   Final    BOTTLES DRAWN AEROBIC AND ANAEROBIC Blood Culture adequate volume   Culture   Final    NO GROWTH 5 DAYS Performed at [facility], [address] [place], [place] [address]    Report Status 11/30/2020 FINAL  Final  Culture, Respiratory w Gram Stain     Status: None   Collection Time: 12/06/20 10:40 AM   Specimen: Tracheal Aspirate; Respiratory  Result Value Ref Range Status   Specimen Description TRACHEAL ASPIRATE  Final   Special Requests NONE  Final   Gram Stain   Final    ABUNDANT WBC PRESENT, PREDOMINANTLY PMN ABUNDANT GRAM NEGATIVE RODS FEW GRAM POSITIVE COCCI FEW GRAM POSITIVE RODS    Culture  Final    MODERATE PSEUDOMONAS AERUGINOSA Two isolates with different morphologies were identified as the same organism.The most resistant organism was reported. Performed at [facility], [address] [place], [place] [address]    Report Status 12/09/2020 FINAL  Final   Organism ID, Bacteria PSEUDOMONAS AERUGINOSA  Final      Susceptibility   Pseudomonas aeruginosa - MIC*    CEFTAZIDIME 16 INTERMEDIATE Intermediate     CIPROFLOXACIN 1 SENSITIVE Sensitive     GENTAMICIN 2 SENSITIVE Sensitive     IMIPENEM >=16 RESISTANT Resistant     * MODERATE PSEUDOMONAS AERUGINOSA  Culture, blood (routine x 2)     Status: None   Collection Time: 12/13/20  9:09 AM   Specimen: BLOOD LEFT HAND  Result Value Ref Range Status   Specimen Description BLOOD LEFT HAND  Final   Special  Requests   Final    BOTTLES DRAWN AEROBIC AND ANAEROBIC Blood Culture results may not be optimal due to an inadequate volume of blood received in culture bottles   Culture   Final    NO GROWTH 5 DAYS Performed at [facility], [address] [place], [place] [address]    Report Status 12/18/2020 FINAL  Final  Culture, blood (routine x 2)     Status: Abnormal   Collection Time: 12/13/20  9:13 AM   Specimen: BLOOD  Result Value Ref Range Status   Specimen Description BLOOD RIGHT ANTECUBITAL  Final   Special Requests   Final    BOTTLES DRAWN AEROBIC AND ANAEROBIC Blood Culture adequate volume   Culture  Setup Time   Final    GRAM POSITIVE COCCI IN CLUSTERS AEROBIC BOTTLE ONLY CRITICAL RESULT CALLED TO, READ BACK BY AND VERIFIED WITH: RN C.ROBENSON AT 1234 ON 12/14/2020 BY T.SAAD.    Culture (A)  Final    STAPHYLOCOCCUS EPIDERMIDIS THE SIGNIFICANCE OF ISOLATING THIS ORGANISM FROM A SINGLE SET OF BLOOD CULTURES WHEN MULTIPLE SETS ARE DRAWN IS UNCERTAIN. PLEASE NOTIFY THE MICROBIOLOGY DEPARTMENT WITHIN ONE WEEK IF SPECIATION AND SENSITIVITIES ARE REQUIRED. Performed at [facility], [address] [place], [place] [address]    Report Status 12/16/2020 FINAL  Final  Blood Culture ID Panel (Reflexed)     Status: Abnormal   Collection Time: 12/13/20  9:13 AM  Result Value Ref Range Status   Enterococcus faecalis NOT DETECTED NOT DETECTED Final   Enterococcus Faecium NOT DETECTED NOT DETECTED Final   Listeria monocytogenes NOT DETECTED NOT DETECTED Final   Staphylococcus species DETECTED (A) NOT DETECTED Final    Comment: CRITICAL RESULT CALLED TO, READ BACK BY AND VERIFIED WITH: RN C.ROBERTSON AT 1234 ON 12/14/2020 BY T.SAAD.    Staphylococcus aureus (BCID) NOT DETECTED NOT DETECTED Final   Staphylococcus epidermidis DETECTED (A) NOT DETECTED Final    Comment: Methicillin (oxacillin) resistant coagulase negative staphylococcus. Possible blood culture contaminant (unless  isolated from more than one blood culture draw or clinical case suggests pathogenicity). No antibiotic treatment is indicated for blood  culture contaminants. CRITICAL RESULT CALLED TO, READ BACK BY AND VERIFIED WITH: RN C.ROBERTSON AT 1234 ON 12/14/2020 BY T.SAAD.    Staphylococcus lugdunensis NOT DETECTED NOT DETECTED Final   Streptococcus species NOT DETECTED NOT DETECTED Final   Streptococcus agalactiae NOT DETECTED NOT DETECTED Final   Streptococcus pneumoniae NOT DETECTED NOT DETECTED Final   Streptococcus pyogenes NOT DETECTED NOT DETECTED Final   A.calcoaceticus-baumannii NOT DETECTED NOT DETECTED Final   Bacteroides fragilis NOT DETECTED  NOT DETECTED Final   Enterobacterales NOT DETECTED NOT DETECTED Final   Enterobacter cloacae complex NOT DETECTED NOT DETECTED Final   Escherichia coli NOT DETECTED NOT DETECTED Final   Klebsiella aerogenes NOT DETECTED NOT DETECTED Final   Klebsiella oxytoca NOT DETECTED NOT DETECTED Final   Klebsiella pneumoniae NOT DETECTED NOT DETECTED Final   Proteus species NOT DETECTED NOT DETECTED Final   Salmonella species NOT DETECTED NOT DETECTED Final   Serratia marcescens NOT DETECTED NOT DETECTED Final   Haemophilus influenzae NOT DETECTED NOT DETECTED Final   Neisseria meningitidis NOT DETECTED NOT DETECTED Final   Pseudomonas aeruginosa NOT DETECTED NOT DETECTED Final   Stenotrophomonas maltophilia NOT DETECTED NOT DETECTED Final   Candida albicans NOT DETECTED NOT DETECTED Final   Candida auris NOT DETECTED NOT DETECTED Final   Candida glabrata NOT DETECTED NOT DETECTED Final   Candida krusei NOT DETECTED NOT DETECTED Final   Candida parapsilosis NOT DETECTED NOT DETECTED Final   Candida tropicalis NOT DETECTED NOT DETECTED Final   Cryptococcus neoformans/gattii NOT DETECTED NOT DETECTED Final   Methicillin resistance mecA/C DETECTED (A) NOT DETECTED Final    Comment: CRITICAL RESULT CALLED TO, READ BACK BY AND VERIFIED WITH: RN C.ROBERTSON  AT 1234 ON 12/14/2020 BY T.SAAD. Performed at [facility], [address], [place], [place] [address]   Culture, blood (routine x 2)     Status: None   Collection Time: 12/17/20 11:31 AM   Specimen: BLOOD  Result Value Ref Range Status   Specimen Description BLOOD RIGHT ANTECUBITAL  Final   Special Requests   Final    BOTTLES DRAWN AEROBIC ONLY Blood Culture results may not be optimal due to an inadequate volume of blood received in culture bottles   Culture   Final    NO GROWTH 5 DAYS Performed at [facility], [address] [place], [place] [address]    Report Status 12/22/2020 FINAL  Final  Culture, blood (routine x 2)     Status: None   Collection Time: 12/17/20 11:38 AM   Specimen: BLOOD LEFT HAND  Result Value Ref Range Status   Specimen Description BLOOD LEFT HAND  Final   Special Requests   Final    BOTTLES DRAWN AEROBIC ONLY Blood Culture adequate volume   Culture   Final    NO GROWTH 5 DAYS Performed at [facility], [address] [place], [place] [address]    Report Status 12/22/2020 FINAL  Final  Culture, blood (routine x 2)     Status: None   Collection Time: 12/29/20  8:10 AM   Specimen: BLOOD RIGHT HAND  Result Value Ref Range Status   Specimen Description BLOOD RIGHT HAND  Final   Special Requests   Final    BOTTLES DRAWN AEROBIC AND ANAEROBIC Blood Culture results may not be optimal due to an inadequate volume of blood received in culture bottles   Culture   Final    NO GROWTH 5 DAYS Performed at [facility], [address] [place], [place] [address]    Report Status 01/03/2021 FINAL  Final  Culture, blood (routine x 2)     Status: None   Collection Time: 12/29/20  8:17 AM   Specimen: BLOOD RIGHT HAND  Result Value Ref Range Status   Specimen Description BLOOD RIGHT HAND  Final   Special Requests   Final    BOTTLES DRAWN AEROBIC ONLY Blood Culture results may not be optimal due  to an inadequate volume of blood received in  culture bottles   Culture   Final    NO GROWTH 5 DAYS Performed at [facility], [address] [place], [place] [address]    Report Status 01/03/2021 FINAL  Final  Culture, Respiratory w Gram Stain     Status: None   Collection Time: 01/01/21 11:54 AM   Specimen: Tracheal Aspirate; Respiratory  Result Value Ref Range Status   Specimen Description TRACHEAL ASPIRATE  Final   Special Requests NONE  Final   Gram Stain   Final    RARE WBC PRESENT,BOTH PMN AND MONONUCLEAR FEW GRAM NEGATIVE RODS Performed at [facility], [address] [place], [place] [address]    Culture MODERATE PSEUDOMONAS AERUGINOSA  Final   Report Status 01/03/2021 FINAL  Final   Organism ID, Bacteria PSEUDOMONAS AERUGINOSA  Final      Susceptibility   Pseudomonas aeruginosa - MIC*    CEFTAZIDIME 4 SENSITIVE Sensitive     CIPROFLOXACIN 1 SENSITIVE Sensitive     GENTAMICIN <=1 SENSITIVE Sensitive     IMIPENEM 2 SENSITIVE Sensitive     PIP/TAZO <=4 SENSITIVE Sensitive     CEFEPIME 2 SENSITIVE Sensitive     * MODERATE PSEUDOMONAS AERUGINOSA  Culture, blood (routine x 2)     Status: None   Collection Time: 01/04/21 11:02 AM   Specimen: BLOOD RIGHT HAND  Result Value Ref Range Status   Specimen Description BLOOD RIGHT HAND  Final   Special Requests   Final    BOTTLES DRAWN AEROBIC AND ANAEROBIC Blood Culture adequate volume   Culture   Final    NO GROWTH 5 DAYS Performed at [facility], [address], [place], [place] [address]    Report Status 01/09/2021 FINAL  Final  Culture, blood (routine x 2)     Status: Abnormal   Collection Time: 01/04/21 11:11 AM   Specimen: BLOOD RIGHT FOREARM  Result Value Ref Range Status   Specimen Description BLOOD RIGHT FOREARM  Final   Special Requests   Final    BOTTLES DRAWN AEROBIC AND ANAEROBIC Blood Culture results may not be optimal due to an inadequate volume of blood received in culture bottles   Culture  Setup Time   Final    GRAM POSITIVE  COCCI IN CLUSTERS ANAEROBIC BOTTLE ONLY CRITICAL RESULT CALLED TO, READ BACK BY AND VERIFIED WITH: RN D [place] [id number] AT 1003 BY CM    Culture (A)  Final    STAPHYLOCOCCUS EPIDERMIDIS THE SIGNIFICANCE OF ISOLATING THIS ORGANISM FROM A SINGLE SET OF BLOOD CULTURES WHEN MULTIPLE SETS ARE DRAWN IS UNCERTAIN. PLEASE NOTIFY THE MICROBIOLOGY DEPARTMENT WITHIN ONE WEEK IF SPECIATION AND SENSITIVITIES ARE REQUIRED. Performed at [facility], [address], [place], [place] [address]    Report Status 01/07/2021 FINAL  Final  Culture, blood (routine x 2)     Status: None   Collection Time: 01/09/21  5:00 PM   Specimen: BLOOD  Result Value Ref Range Status   Specimen Description BLOOD RIGHT ANTECUBITAL  Final   Special Requests   Final    BOTTLES DRAWN AEROBIC AND ANAEROBIC Blood Culture results may not be optimal due to an inadequate volume of blood received in culture bottles   Culture   Final    NO GROWTH 5 DAYS Performed at [facility], [address] [place], [place] [address]    Report Status 01/14/2021 FINAL  Final  Culture, blood (routine x 2)     Status: Abnormal   Collection Time: 01/09/21  5:04 PM   Specimen: BLOOD  Result Value Ref Range Status   Specimen Description BLOOD RIGHT ANTECUBITAL  Final   Special Requests   Final    BOTTLES DRAWN AEROBIC ONLY Blood Culture results may not be optimal due to an inadequate volume of blood received in culture bottles   Culture  Setup Time   Final    GRAM POSITIVE COCCI IN CLUSTERS AEROBIC BOTTLE ONLY CRITICAL VALUE NOTED.  VALUE IS CONSISTENT WITH PREVIOUSLY REPORTED AND CALLED VALUE.    Culture (A)  Final    STAPHYLOCOCCUS CAPITIS THE SIGNIFICANCE OF ISOLATING THIS ORGANISM FROM A SINGLE SET OF BLOOD CULTURES WHEN MULTIPLE SETS ARE DRAWN IS UNCERTAIN. PLEASE NOTIFY THE MICROBIOLOGY DEPARTMENT WITHIN ONE WEEK IF SPECIATION AND SENSITIVITIES ARE REQUIRED. Performed at [facility], [address],  [place], [place] [address]    Report Status 01/11/2021 FINAL  Final  Culture, Respiratory w Gram Stain     Status: None   Collection Time: 01/14/21  6:04 PM   Specimen: Tracheal Aspirate; Respiratory  Result Value Ref Range Status   Specimen Description TRACHEAL ASPIRATE  Final   Special Requests NONE  Final   Gram Stain   Final    RARE WBC PRESENT,BOTH PMN AND MONONUCLEAR FEW SQUAMOUS EPITHELIAL CELLS PRESENT FEW GRAM NEGATIVE RODS RARE GRAM POSITIVE COCCI Performed at [facility], [address] [place], [place] [address]    Culture MODERATE PSEUDOMONAS AERUGINOSA  Final   Report Status 01/18/2021 FINAL  Final   Organism ID, Bacteria PSEUDOMONAS AERUGINOSA  Final      Susceptibility   Pseudomonas aeruginosa - MIC*    CEFTAZIDIME 4 SENSITIVE Sensitive     CIPROFLOXACIN 2 INTERMEDIATE Intermediate     GENTAMICIN 4 SENSITIVE Sensitive     IMIPENEM >=16 RESISTANT Resistant     CEFEPIME 4 SENSITIVE Sensitive     * MODERATE PSEUDOMONAS AERUGINOSA    Coagulation Studies: No results for input(s): LABPROT, INR in the last 72 hours.  Urinalysis: No results for input(s): COLORURINE, LABSPEC, PHURINE, GLUCOSEU, HGBUR, BILIRUBINUR, KETONESUR, PROTEINUR, UROBILINOGEN, NITRITE, LEUKOCYTESUR in the last 72 hours.  Invalid input(s): APPERANCEUR    Imaging: No results found.   Medications:       Assessment/ Plan:  65 y.o. male with a PMHx of ESRD on HD TTS, anemia of chronic kidney disease, hypertension, acute respiratory failure status post tracheostomy placement, seizure disorder, hyperlipidemia, diabetes mellitus type 2, history of hyperkalemia, history of coronary artery disease, peripheral vascular disease who was admitted to [facility] on 11/21/2020 for ongoing treatment of acute respiratory failure status post tracheostomy placement and ESRD.   1.  ESRD on HD.    Had dialysis early this AM.  Tolerated well.  Next dialysis on Wednesday.   2.  Acute respiratory  failure.  Breathing comfortably through trach.   3.  Anemia of chronic kidney disease.  Hemoglobin currently 7.4, maintain current dose of retacrit.   4.  Secondary hyperparathyroidism.  Post HD phos noted, will mointor.   5.  Fever/sepsis.  Management per hospitalist and infectious disease.   LOS: 0 Vernis [place] 7/18/20228:03 AM

## 2021-01-26 LAB — CBC
HCT: 19.9 % — ABNORMAL LOW (ref 39.0–52.0)
Hemoglobin: 6.5 g/dL — CL (ref 13.0–17.0)
MCH: 31.3 pg (ref 26.0–34.0)
MCHC: 32.7 g/dL (ref 30.0–36.0)
MCV: 95.7 fL (ref 80.0–100.0)
Platelets: 322 10*3/uL (ref 150–400)
RBC: 2.08 MIL/uL — ABNORMAL LOW (ref 4.22–5.81)
RDW: 17.2 % — ABNORMAL HIGH (ref 11.5–15.5)
WBC: 15.5 10*3/uL — ABNORMAL HIGH (ref 4.0–10.5)
nRBC: 0 % (ref 0.0–0.2)

## 2021-01-26 LAB — PREPARE RBC (CROSSMATCH)

## 2021-01-26 LAB — RENAL FUNCTION PANEL
Albumin: 1.8 g/dL — ABNORMAL LOW (ref 3.5–5.0)
Anion gap: 15 (ref 5–15)
BUN: 113 mg/dL — ABNORMAL HIGH (ref 8–23)
CO2: 22 mmol/L (ref 22–32)
Calcium: 9.4 mg/dL (ref 8.9–10.3)
Chloride: 91 mmol/L — ABNORMAL LOW (ref 98–111)
Creatinine, Ser: 3.65 mg/dL — ABNORMAL HIGH (ref 0.61–1.24)
GFR, Estimated: 18 mL/min — ABNORMAL LOW (ref >60)
Glucose, Bld: 140 mg/dL — ABNORMAL HIGH (ref 70–99)
Phosphorus: 2 mg/dL — ABNORMAL LOW (ref 2.5–4.6)
Potassium: 4.6 mmol/L (ref 3.5–5.1)
Sodium: 128 mmol/L — ABNORMAL LOW (ref 135–145)

## 2021-01-26 LAB — VANCOMYCIN, TROUGH: Vancomycin Tr: 17 ug/mL (ref 15–20)

## 2021-01-26 NOTE — Progress Notes (Signed)
[facility]  ROUNDING NOTE   Subjective:  Patient laying in bed at the moment. Still having myoclonic facial twitches. Not able to converse at all.   Objective:  Vital signs in last 24 hours:  Temperature nine 9.5 pulse 98 respirations 21 blood pressure 129/56  Physical Exam: General:  No acute distress  Head:  Normocephalic, atraumatic. 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  trace right lower extremity edema  Neurologic:  Awake, facial myoclonic twitches  Skin:  No acute rash  Access:  Right IJ temporary dialysis catheter    Basic Metabolic Panel: Recent Labs  Lab 01/21/21 0505 01/24/21 0500 01/26/21 0548  NA 130* 136 128*  K 5.1 3.3* 4.6  CL 92* 98 91*  CO2 25 26 22   GLUCOSE 164* 126* 140*  BUN 97* 35* 113*  CREATININE 3.50* 1.55* 3.65*  CALCIUM 9.8 8.6* 9.4  PHOS 3.1 1.4* 2.0*     Liver Function Tests: Recent Labs  Lab 01/21/21 0505 01/24/21 0500 01/26/21 0548  ALBUMIN 2.0* 1.9* 1.8*    No results for input(s): LIPASE, AMYLASE in the last 168 hours. No results for input(s): AMMONIA in the last 168 hours.  CBC: Recent Labs  Lab 01/21/21 0505 01/24/21 0500 01/26/21 0548  WBC 13.2* 14.1* 15.5*  HGB 7.7* 7.4* 6.5*  HCT 23.8* 23.3* 19.9*  MCV 95.2 95.5 95.7  PLT 468* 405* 322     Cardiac Enzymes: No results for input(s): CKTOTAL, CKMB, CKMBINDEX, TROPONINI in the last 168 hours.  BNP: Invalid input(s): POCBNP  CBG: No results for input(s): GLUCAP in the last 168 hours.  Microbiology: Results for orders placed or performed during the hospital encounter of 11/21/20  Culture, blood (routine x 2)     Status: None   Collection Time: 11/25/20  3:25 PM   Specimen: BLOOD RIGHT HAND  Result Value Ref Range Status   Specimen Description BLOOD RIGHT HAND   Final   Special Requests   Final    BOTTLES DRAWN AEROBIC ONLY Blood Culture results may not be optimal due to an inadequate volume of blood received in culture bottles   Culture   Final    NO GROWTH 5 DAYS Performed at [name], [address], [place], [place]    Report Status 11/30/2020 FINAL  Final  Culture, blood (routine x 2)     Status: None   Collection Time: 11/25/20  3:29 PM   Specimen: BLOOD LEFT HAND  Result Value Ref Range Status   Specimen Description BLOOD LEFT HAND  Final   Special Requests   Final    BOTTLES DRAWN AEROBIC AND ANAEROBIC Blood Culture adequate volume   Culture   Final    NO GROWTH 5 DAYS Performed at [facility], [address], [place], [place]    Report Status 11/30/2020 FINAL  Final  Culture, Respiratory w Gram Stain     Status: None   Collection Time: 12/06/20 10:40 AM   Specimen: Tracheal Aspirate; Respiratory  Result Value Ref Range Status   Specimen Description TRACHEAL ASPIRATE  Final   Special Requests NONE  Final   Gram Stain   Final    ABUNDANT WBC PRESENT, PREDOMINANTLY PMN ABUNDANT GRAM NEGATIVE RODS FEW GRAM POSITIVE COCCI FEW GRAM  POSITIVE RODS    Culture   Final    MODERATE PSEUDOMONAS AERUGINOSA Two isolates with different morphologies were identified as the same organism.The most resistant organism was reported. Performed at [facility], [address], [place], [place] [address]    Report Status 12/09/2020 FINAL  Final   Organism ID, Bacteria PSEUDOMONAS AERUGINOSA  Final      Susceptibility   Pseudomonas aeruginosa - MIC*    CEFTAZIDIME 16 INTERMEDIATE Intermediate     CIPROFLOXACIN 1 SENSITIVE Sensitive     GENTAMICIN 2 SENSITIVE Sensitive     IMIPENEM >=16 RESISTANT Resistant     * MODERATE PSEUDOMONAS AERUGINOSA  Culture, blood (routine x 2)     Status: None   Collection Time: 12/13/20  9:09 AM   Specimen: BLOOD LEFT HAND  Result Value Ref Range Status   Specimen  Description BLOOD LEFT HAND  Final   Special Requests   Final    BOTTLES DRAWN AEROBIC AND ANAEROBIC Blood Culture results may not be optimal due to an inadequate volume of blood received in culture bottles   Culture   Final    NO GROWTH 5 DAYS Performed at [facility], [address], [place], [place] [address]    Report Status 12/18/2020 FINAL  Final  Culture, blood (routine x 2)     Status: Abnormal   Collection Time: 12/13/20  9:13 AM   Specimen: BLOOD  Result Value Ref Range Status   Specimen Description BLOOD RIGHT ANTECUBITAL  Final   Special Requests   Final    BOTTLES DRAWN AEROBIC AND ANAEROBIC Blood Culture adequate volume   Culture  Setup Time   Final    GRAM POSITIVE COCCI IN CLUSTERS AEROBIC BOTTLE ONLY CRITICAL RESULT CALLED TO, READ BACK BY AND VERIFIED WITH: RN C.ROBENSON AT 1234 ON 12/14/2020 BY T.SAAD.    Culture (A)  Final    STAPHYLOCOCCUS EPIDERMIDIS THE SIGNIFICANCE OF ISOLATING THIS ORGANISM FROM A SINGLE SET OF BLOOD CULTURES WHEN MULTIPLE SETS ARE DRAWN IS UNCERTAIN. PLEASE NOTIFY THE MICROBIOLOGY DEPARTMENT WITHIN ONE WEEK IF SPECIATION AND SENSITIVITIES ARE REQUIRED. Performed at [facility], [address] [place], [place] [address]    Report Status 12/16/2020 FINAL  Final  Blood Culture ID Panel (Reflexed)     Status: Abnormal   Collection Time: 12/13/20  9:13 AM  Result Value Ref Range Status   Enterococcus faecalis NOT DETECTED NOT DETECTED Final   Enterococcus Faecium NOT DETECTED NOT DETECTED Final   Listeria monocytogenes NOT DETECTED NOT DETECTED Final   Staphylococcus species DETECTED (A) NOT DETECTED Final    Comment: CRITICAL RESULT CALLED TO, READ BACK BY AND VERIFIED WITH: RN C.ROBERTSON AT 1234 ON 12/14/2020 BY T.SAAD.    Staphylococcus aureus (BCID) NOT DETECTED NOT DETECTED Final   Staphylococcus epidermidis DETECTED (A) NOT DETECTED Final    Comment: Methicillin (oxacillin) resistant coagulase negative staphylococcus.  Possible blood culture contaminant (unless isolated from more than one blood culture draw or clinical case suggests pathogenicity). No antibiotic treatment is indicated for blood  culture contaminants. CRITICAL RESULT CALLED TO, READ BACK BY AND VERIFIED WITH: RN C.ROBERTSON AT 1234 ON 12/14/2020 BY T.SAAD.    Staphylococcus lugdunensis NOT DETECTED NOT DETECTED Final   Streptococcus species NOT DETECTED NOT DETECTED Final   Streptococcus agalactiae NOT DETECTED NOT DETECTED Final   Streptococcus pneumoniae NOT DETECTED NOT DETECTED Final   Streptococcus pyogenes NOT DETECTED NOT DETECTED Final   A.calcoaceticus-baumannii NOT DETECTED NOT  DETECTED Final   Bacteroides fragilis NOT DETECTED NOT DETECTED Final   Enterobacterales NOT DETECTED NOT DETECTED Final   Enterobacter cloacae complex NOT DETECTED NOT DETECTED Final   Escherichia coli NOT DETECTED NOT DETECTED Final   Klebsiella aerogenes NOT DETECTED NOT DETECTED Final   Klebsiella oxytoca NOT DETECTED NOT DETECTED Final   Klebsiella pneumoniae NOT DETECTED NOT DETECTED Final   Proteus species NOT DETECTED NOT DETECTED Final   Salmonella species NOT DETECTED NOT DETECTED Final   Serratia marcescens NOT DETECTED NOT DETECTED Final   Haemophilus influenzae NOT DETECTED NOT DETECTED Final   Neisseria meningitidis NOT DETECTED NOT DETECTED Final   Pseudomonas aeruginosa NOT DETECTED NOT DETECTED Final   Stenotrophomonas maltophilia NOT DETECTED NOT DETECTED Final   Candida albicans NOT DETECTED NOT DETECTED Final   Candida auris NOT DETECTED NOT DETECTED Final   Candida glabrata NOT DETECTED NOT DETECTED Final   Candida krusei NOT DETECTED NOT DETECTED Final   Candida parapsilosis NOT DETECTED NOT DETECTED Final   Candida tropicalis NOT DETECTED NOT DETECTED Final   Cryptococcus neoformans/gattii NOT DETECTED NOT DETECTED Final   Methicillin resistance mecA/C DETECTED (A) NOT DETECTED Final    Comment: CRITICAL RESULT CALLED TO, READ  BACK BY AND VERIFIED WITH: RN C.ROBERTSON AT 1234 ON 12/14/2020 BY T.SAAD. Performed at [facility], [address], [place], [place] [address]   Culture, blood (routine x 2)     Status: None   Collection Time: 12/17/20 11:31 AM   Specimen: BLOOD  Result Value Ref Range Status   Specimen Description BLOOD RIGHT ANTECUBITAL  Final   Special Requests   Final    BOTTLES DRAWN AEROBIC ONLY Blood Culture results may not be optimal due to an inadequate volume of blood received in culture bottles   Culture   Final    NO GROWTH 5 DAYS Performed at [facility], [address], [place], [place] [address]    Report Status 12/22/2020 FINAL  Final  Culture, blood (routine x 2)     Status: None   Collection Time: 12/17/20 11:38 AM   Specimen: BLOOD LEFT HAND  Result Value Ref Range Status   Specimen Description BLOOD LEFT HAND  Final   Special Requests   Final    BOTTLES DRAWN AEROBIC ONLY Blood Culture adequate volume   Culture   Final    NO GROWTH 5 DAYS Performed at [facility], [address], [place], [place] [address]    Report Status 12/22/2020 FINAL  Final  Culture, blood (routine x 2)     Status: None   Collection Time: 12/29/20  8:10 AM   Specimen: BLOOD RIGHT HAND  Result Value Ref Range Status   Specimen Description BLOOD RIGHT HAND  Final   Special Requests   Final    BOTTLES DRAWN AEROBIC AND ANAEROBIC Blood Culture results may not be optimal due to an inadequate volume of blood received in culture bottles   Culture   Final    NO GROWTH 5 DAYS Performed at [facility], [address], [place], [place] [address]    Report Status 01/03/2021 FINAL  Final  Culture, blood (routine x 2)     Status: None   Collection Time: 12/29/20  8:17 AM   Specimen: BLOOD RIGHT HAND  Result Value Ref Range Status   Specimen Description BLOOD RIGHT HAND  Final   Special Requests   Final    BOTTLES DRAWN AEROBIC ONLY  Blood Culture results may not be optimal due to  an inadequate volume of blood received in culture bottles   Culture   Final    NO GROWTH 5 DAYS Performed at [facility], [address], [place], [place] [address]    Report Status 01/03/2021 FINAL  Final  Culture, Respiratory w Gram Stain     Status: None   Collection Time: 01/01/21 11:54 AM   Specimen: Tracheal Aspirate; Respiratory  Result Value Ref Range Status   Specimen Description TRACHEAL ASPIRATE  Final   Special Requests NONE  Final   Gram Stain   Final    RARE WBC PRESENT,BOTH PMN AND MONONUCLEAR FEW GRAM NEGATIVE RODS Performed at [facility], [address], [place], [place] [address]    Culture MODERATE PSEUDOMONAS AERUGINOSA  Final   Report Status 01/03/2021 FINAL  Final   Organism ID, Bacteria PSEUDOMONAS AERUGINOSA  Final      Susceptibility   Pseudomonas aeruginosa - MIC*    CEFTAZIDIME 4 SENSITIVE Sensitive     CIPROFLOXACIN 1 SENSITIVE Sensitive     GENTAMICIN <=1 SENSITIVE Sensitive     IMIPENEM 2 SENSITIVE Sensitive     PIP/TAZO <=4 SENSITIVE Sensitive     CEFEPIME 2 SENSITIVE Sensitive     * MODERATE PSEUDOMONAS AERUGINOSA  Culture, blood (routine x 2)     Status: None   Collection Time: 01/04/21 11:02 AM   Specimen: BLOOD RIGHT HAND  Result Value Ref Range Status   Specimen Description BLOOD RIGHT HAND  Final   Special Requests   Final    BOTTLES DRAWN AEROBIC AND ANAEROBIC Blood Culture adequate volume   Culture   Final    NO GROWTH 5 DAYS Performed at [facility], [address] [place], [place] [address]    Report Status 01/09/2021 FINAL  Final  Culture, blood (routine x 2)     Status: Abnormal   Collection Time: 01/04/21 11:11 AM   Specimen: BLOOD RIGHT FOREARM  Result Value Ref Range Status   Specimen Description BLOOD RIGHT FOREARM  Final   Special Requests   Final    BOTTLES DRAWN AEROBIC AND ANAEROBIC Blood Culture results may not be optimal due to an inadequate volume of blood received in culture bottles   Culture   Setup Time   Final    GRAM POSITIVE COCCI IN CLUSTERS ANAEROBIC BOTTLE ONLY CRITICAL RESULT CALLED TO, READ BACK BY AND VERIFIED WITH: RN D [place] [id number] AT 1003 BY CM    Culture (A)  Final    STAPHYLOCOCCUS EPIDERMIDIS THE SIGNIFICANCE OF ISOLATING THIS ORGANISM FROM A SINGLE SET OF BLOOD CULTURES WHEN MULTIPLE SETS ARE DRAWN IS UNCERTAIN. PLEASE NOTIFY THE MICROBIOLOGY DEPARTMENT WITHIN ONE WEEK IF SPECIATION AND SENSITIVITIES ARE REQUIRED. Performed at [facility], [address] [place], [place] [address]    Report Status 01/07/2021 FINAL  Final  Culture, blood (routine x 2)     Status: None   Collection Time: 01/09/21  5:00 PM   Specimen: BLOOD  Result Value Ref Range Status   Specimen Description BLOOD RIGHT ANTECUBITAL  Final   Special Requests   Final    BOTTLES DRAWN AEROBIC AND ANAEROBIC Blood Culture results may not be optimal due to an inadequate volume of blood received in culture bottles   Culture   Final    NO GROWTH 5 DAYS Performed at [facility], [address], [place], [place] [address]  Report Status 01/14/2021 FINAL  Final  Culture, blood (routine x 2)     Status: Abnormal   Collection Time: 01/09/21  5:04 PM   Specimen: BLOOD  Result Value Ref Range Status   Specimen Description BLOOD RIGHT ANTECUBITAL  Final   Special Requests   Final    BOTTLES DRAWN AEROBIC ONLY Blood Culture results may not be optimal due to an inadequate volume of blood received in culture bottles   Culture  Setup Time   Final    GRAM POSITIVE COCCI IN CLUSTERS AEROBIC BOTTLE ONLY CRITICAL VALUE NOTED.  VALUE IS CONSISTENT WITH PREVIOUSLY REPORTED AND CALLED VALUE.    Culture (A)  Final    STAPHYLOCOCCUS CAPITIS THE SIGNIFICANCE OF ISOLATING THIS ORGANISM FROM A SINGLE SET OF BLOOD CULTURES WHEN MULTIPLE SETS ARE DRAWN IS UNCERTAIN. PLEASE NOTIFY THE MICROBIOLOGY DEPARTMENT WITHIN ONE WEEK IF SPECIATION AND SENSITIVITIES ARE REQUIRED. Performed at [facility], [address], [place], [place] [address]    Report Status 01/11/2021 FINAL  Final  Culture, Respiratory w Gram Stain     Status: None   Collection Time: 01/14/21  6:04 PM   Specimen: Tracheal Aspirate; Respiratory  Result Value Ref Range Status   Specimen Description TRACHEAL ASPIRATE  Final   Special Requests NONE  Final   Gram Stain   Final    RARE WBC PRESENT,BOTH PMN AND MONONUCLEAR FEW SQUAMOUS EPITHELIAL CELLS PRESENT FEW GRAM NEGATIVE RODS RARE GRAM POSITIVE COCCI Performed at [facility], [address], [place], [place] [address]    Culture MODERATE PSEUDOMONAS AERUGINOSA  Final   Report Status 01/18/2021 FINAL  Final   Organism ID, Bacteria PSEUDOMONAS AERUGINOSA  Final      Susceptibility   Pseudomonas aeruginosa - MIC*    CEFTAZIDIME 4 SENSITIVE Sensitive     CIPROFLOXACIN 2 INTERMEDIATE Intermediate     GENTAMICIN 4 SENSITIVE Sensitive     IMIPENEM >=16 RESISTANT Resistant     CEFEPIME 4 SENSITIVE Sensitive     * MODERATE PSEUDOMONAS AERUGINOSA  Culture, blood (routine x 2)     Status: None (Preliminary result)   Collection Time: 01/24/21  2:20 PM   Specimen: BLOOD LEFT HAND  Result Value Ref Range Status   Specimen Description BLOOD LEFT HAND  Final   Special Requests   Final    BOTTLES DRAWN AEROBIC AND ANAEROBIC Blood Culture adequate volume   Culture   Final    NO GROWTH 2 DAYS Performed at [facility], [address] [place], [place] [address]    Report Status PENDING  Incomplete  Culture, blood (routine x 2)     Status: None (Preliminary result)   Collection Time: 01/24/21  2:24 PM   Specimen: BLOOD LEFT HAND  Result Value Ref Range Status   Specimen Description BLOOD LEFT HAND  Final   Special Requests   Final    BOTTLES DRAWN AEROBIC AND ANAEROBIC Blood Culture results may not be optimal due to an inadequate volume of blood received in culture bottles   Culture   Final    NO GROWTH 2 DAYS Performed at [facility]  Lab, [address], [place], [place] [address]    Report Status PENDING  Incomplete    Coagulation Studies: No results for input(s): LABPROT, INR in the last 72 hours.  Urinalysis: No results for input(s): COLORURINE, LABSPEC, PHURINE, GLUCOSEU, HGBUR, BILIRUBINUR, KETONESUR, PROTEINUR, UROBILINOGEN, NITRITE, LEUKOCYTESUR in the last 72 hours.  Invalid  input(s): APPERANCEUR    Imaging: No results found.   Medications:       Assessment/ Plan:  65 y.o. male with a PMHx of ESRD on HD TTS, anemia of chronic kidney disease, hypertension, acute respiratory failure status post tracheostomy placement, seizure disorder, hyperlipidemia, diabetes mellitus type 2, history of hyperkalemia, history of coronary artery disease, peripheral vascular disease who was admitted to [facility] on 11/21/2020 for ongoing treatment of acute respiratory failure status post tracheostomy placement and ESRD.   1.  ESRD on HD.    Due to dialysis nurse staffing issues dialysis will be delayed till tomorrow.  2.  Acute respiratory failure.  Stable breathing through T-piece via his tracheostomy.  3.  Anemia of chronic kidney disease.  Hemoglobin down to 6.5.  It appears that blood transfusion has been ordered.  4.  Secondary hyperparathyroidism.  Phosphorus higher at 2.0.  Continue to monitor.  5.  Fever/sepsis.  Management per hospitalist and infectious disease.   LOS: 0 Joel Hebert 7/20/202210:46 AM

## 2021-01-27 LAB — BPAM RBC
Blood Product Expiration Date: 202208212359
ISSUE DATE / TIME: 202207201210
Unit Type and Rh: 5100

## 2021-01-27 LAB — TYPE AND SCREEN
ABO/RH(D): O POS
Antibody Screen: NEGATIVE
Unit division: 0

## 2021-01-27 LAB — HEMOGLOBIN AND HEMATOCRIT, BLOOD
HCT: 27.1 % — ABNORMAL LOW (ref 39.0–52.0)
Hemoglobin: 9 g/dL — ABNORMAL LOW (ref 13.0–17.0)

## 2021-01-28 LAB — CBC
HCT: 24.1 % — ABNORMAL LOW (ref 39.0–52.0)
Hemoglobin: 7.8 g/dL — ABNORMAL LOW (ref 13.0–17.0)
MCH: 29.5 pg (ref 26.0–34.0)
MCHC: 32.4 g/dL (ref 30.0–36.0)
MCV: 91.3 fL (ref 80.0–100.0)
Platelets: 318 10*3/uL (ref 150–400)
RBC: 2.64 MIL/uL — ABNORMAL LOW (ref 4.22–5.81)
RDW: 17.6 % — ABNORMAL HIGH (ref 11.5–15.5)
WBC: 15 10*3/uL — ABNORMAL HIGH (ref 4.0–10.5)
nRBC: 0 % (ref 0.0–0.2)

## 2021-01-28 LAB — RENAL FUNCTION PANEL
Albumin: 1.8 g/dL — ABNORMAL LOW (ref 3.5–5.0)
Anion gap: 11 (ref 5–15)
BUN: 89 mg/dL — ABNORMAL HIGH (ref 8–23)
CO2: 26 mmol/L (ref 22–32)
Calcium: 9.4 mg/dL (ref 8.9–10.3)
Chloride: 94 mmol/L — ABNORMAL LOW (ref 98–111)
Creatinine, Ser: 2.89 mg/dL — ABNORMAL HIGH (ref 0.61–1.24)
GFR, Estimated: 23 mL/min — ABNORMAL LOW (ref >60)
Glucose, Bld: 133 mg/dL — ABNORMAL HIGH (ref 70–99)
Phosphorus: 2.5 mg/dL (ref 2.5–4.6)
Potassium: 3.9 mmol/L (ref 3.5–5.1)
Sodium: 131 mmol/L — ABNORMAL LOW (ref 135–145)

## 2021-01-28 LAB — VANCOMYCIN, TROUGH: Vancomycin Tr: 13 ug/mL — ABNORMAL LOW (ref 15–20)

## 2021-01-28 NOTE — Progress Notes (Signed)
[facility]  ROUNDING NOTE   Subjective:  Patient seen and evaluated during hemodialysis treatment. Tolerating well. UF target 1.5 kg.   Objective:  Vital signs in last 24 hours:  Temperature 97.6 pulse 92 respirations 21 blood pressure 143/73  Physical Exam: General:  No acute distress  Head:  Normocephalic, atraumatic. 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  trace right lower extremity edema  Neurologic:  Awake, facial myoclonic twitches  Skin:  No acute rash  Access:  Right IJ temporary dialysis catheter    Basic Metabolic Panel: Recent Labs  Lab 01/24/21 0500 01/26/21 0548 01/28/21 0536  NA 136 128* 131*  K 3.3* 4.6 3.9  CL 98 91* 94*  CO2 GLUCOSE 126* 140* 133*  BUN 35* 113* 89*  CREATININE 1.55* 3.65* 2.89*  CALCIUM 8.6* 9.4 9.4  PHOS 1.4* 2.0* 2.5     Liver Function Tests: Recent Labs  Lab 01/24/21 0500 01/26/21 0548 01/28/21 0536  ALBUMIN 1.9* 1.8* 1.8*    No results for input(s): LIPASE, AMYLASE in the last 168 hours. No results for input(s): AMMONIA in the last 168 hours.  CBC: Recent Labs  Lab 01/24/21 0500 01/26/21 0548 01/27/21 1025 01/28/21 0536  WBC 14.1* 15.5*  --  15.0*  HGB 7.4* 6.5* 9.0* 7.8*  HCT 23.3* 19.9* 27.1* 24.1*  MCV 95.5 95.7  --  91.3  PLT 405* 322  --  318     Cardiac Enzymes: No results for input(s): CKTOTAL, CKMB, CKMBINDEX, TROPONINI in the last 168 hours.  BNP: Invalid input(s): POCBNP  CBG: No results for input(s): GLUCAP in the last 168 hours.  Microbiology: Results for orders placed or performed during the hospital encounter of 11/21/20  Culture, blood (routine x 2)     Status: None   Collection Time: 11/25/20  3:25 PM   Specimen: BLOOD RIGHT HAND  Result Value Ref Range Status   Specimen  Description BLOOD RIGHT HAND  Final   Special Requests   Final    BOTTLES DRAWN AEROBIC ONLY Blood Culture results may not be optimal due to an inadequate volume of blood received in culture bottles   Culture   Final    NO GROWTH 5 DAYS Performed at [facility], [address], [place], [place] [address]    Report Status 11/30/2020 FINAL  Final  Culture, blood (routine x 2)     Status: None   Collection Time: 11/25/20  3:29 PM   Specimen: BLOOD LEFT HAND  Result Value Ref Range Status   Specimen Description BLOOD LEFT HAND  Final   Special Requests   Final    BOTTLES DRAWN AEROBIC AND ANAEROBIC Blood Culture adequate volume   Culture   Final    NO GROWTH 5 DAYS Performed at [facility], [address] [place], [place] [address]    Report Status 11/30/2020 FINAL  Final  Culture, Respiratory w Gram Stain     Status: None   Collection Time: 12/06/20 10:40 AM   Specimen: Tracheal Aspirate; Respiratory  Result Value Ref Range Status   Specimen Description TRACHEAL ASPIRATE  Final   Special Requests NONE  Final   Gram Stain   Final    ABUNDANT WBC PRESENT, PREDOMINANTLY PMN ABUNDANT GRAM NEGATIVE  RODS FEW GRAM POSITIVE COCCI FEW GRAM POSITIVE RODS    Culture   Final    MODERATE PSEUDOMONAS AERUGINOSA Two isolates with different morphologies were identified as the same organism.The most resistant organism was reported. Performed at [facility], [address], [place], [place] [address]    Report Status 12/09/2020 FINAL  Final   Organism ID, Bacteria PSEUDOMONAS AERUGINOSA  Final      Susceptibility   Pseudomonas aeruginosa - MIC*    CEFTAZIDIME 16 INTERMEDIATE Intermediate     CIPROFLOXACIN 1 SENSITIVE Sensitive     GENTAMICIN 2 SENSITIVE Sensitive     IMIPENEM >=16 RESISTANT Resistant     * MODERATE PSEUDOMONAS AERUGINOSA  Culture, blood (routine x 2)     Status: None   Collection Time: 12/13/20  9:09 AM   Specimen: BLOOD LEFT HAND  Result Value Ref  Range Status   Specimen Description BLOOD LEFT HAND  Final   Special Requests   Final    BOTTLES DRAWN AEROBIC AND ANAEROBIC Blood Culture results may not be optimal due to an inadequate volume of blood received in culture bottles   Culture   Final    NO GROWTH 5 DAYS Performed at [facility], [address], [place], [place] [address]    Report Status 12/18/2020 FINAL  Final  Culture, blood (routine x 2)     Status: Abnormal   Collection Time: 12/13/20  9:13 AM   Specimen: BLOOD  Result Value Ref Range Status   Specimen Description BLOOD RIGHT ANTECUBITAL  Final   Special Requests   Final    BOTTLES DRAWN AEROBIC AND ANAEROBIC Blood Culture adequate volume   Culture  Setup Time   Final    GRAM POSITIVE COCCI IN CLUSTERS AEROBIC BOTTLE ONLY CRITICAL RESULT CALLED TO, READ BACK BY AND VERIFIED WITH: RN C.ROBENSON AT 1234 ON 12/14/2020 BY T.SAAD.    Culture (A)  Final    STAPHYLOCOCCUS EPIDERMIDIS THE SIGNIFICANCE OF ISOLATING THIS ORGANISM FROM A SINGLE SET OF BLOOD CULTURES WHEN MULTIPLE SETS ARE DRAWN IS UNCERTAIN. PLEASE NOTIFY THE MICROBIOLOGY DEPARTMENT WITHIN ONE WEEK IF SPECIATION AND SENSITIVITIES ARE REQUIRED. Performed at [facility], [address], [place], [place] [address]    Report Status 12/16/2020 FINAL  Final  Blood Culture ID Panel (Reflexed)     Status: Abnormal   Collection Time: 12/13/20  9:13 AM  Result Value Ref Range Status   Enterococcus faecalis NOT DETECTED NOT DETECTED Final   Enterococcus Faecium NOT DETECTED NOT DETECTED Final   Listeria monocytogenes NOT DETECTED NOT DETECTED Final   Staphylococcus species DETECTED (A) NOT DETECTED Final    Comment: CRITICAL RESULT CALLED TO, READ BACK BY AND VERIFIED WITH: RN C.ROBERTSON AT 1234 ON 12/14/2020 BY T.SAAD.    Staphylococcus aureus (BCID) NOT DETECTED NOT DETECTED Final   Staphylococcus epidermidis DETECTED (A) NOT DETECTED Final    Comment: Methicillin (oxacillin) resistant coagulase  negative staphylococcus. Possible blood culture contaminant (unless isolated from more than one blood culture draw or clinical case suggests pathogenicity). No antibiotic treatment is indicated for blood  culture contaminants. CRITICAL RESULT CALLED TO, READ BACK BY AND VERIFIED WITH: RN C.ROBERTSON AT 1234 ON 12/14/2020 BY T.SAAD.    Staphylococcus lugdunensis NOT DETECTED NOT DETECTED Final   Streptococcus species NOT DETECTED NOT DETECTED Final   Streptococcus agalactiae NOT DETECTED NOT DETECTED Final   Streptococcus pneumoniae NOT DETECTED NOT DETECTED Final   Streptococcus pyogenes NOT DETECTED NOT DETECTED  Final   A.calcoaceticus-baumannii NOT DETECTED NOT DETECTED Final   Bacteroides fragilis NOT DETECTED NOT DETECTED Final   Enterobacterales NOT DETECTED NOT DETECTED Final   Enterobacter cloacae complex NOT DETECTED NOT DETECTED Final   Escherichia coli NOT DETECTED NOT DETECTED Final   Klebsiella aerogenes NOT DETECTED NOT DETECTED Final   Klebsiella oxytoca NOT DETECTED NOT DETECTED Final   Klebsiella pneumoniae NOT DETECTED NOT DETECTED Final   Proteus species NOT DETECTED NOT DETECTED Final   Salmonella species NOT DETECTED NOT DETECTED Final   Serratia marcescens NOT DETECTED NOT DETECTED Final   Haemophilus influenzae NOT DETECTED NOT DETECTED Final   Neisseria meningitidis NOT DETECTED NOT DETECTED Final   Pseudomonas aeruginosa NOT DETECTED NOT DETECTED Final   Stenotrophomonas maltophilia NOT DETECTED NOT DETECTED Final   Candida albicans NOT DETECTED NOT DETECTED Final   Candida auris NOT DETECTED NOT DETECTED Final   Candida glabrata NOT DETECTED NOT DETECTED Final   Candida krusei NOT DETECTED NOT DETECTED Final   Candida parapsilosis NOT DETECTED NOT DETECTED Final   Candida tropicalis NOT DETECTED NOT DETECTED Final   Cryptococcus neoformans/gattii NOT DETECTED NOT DETECTED Final   Methicillin resistance mecA/C DETECTED (A) NOT DETECTED Final    Comment:  CRITICAL RESULT CALLED TO, READ BACK BY AND VERIFIED WITH: RN C.ROBERTSON AT 1234 ON 12/14/2020 BY T.SAAD. Performed at [facility], [address], [place], [place] [address]   Culture, blood (routine x 2)     Status: None   Collection Time: 12/17/20 11:31 AM   Specimen: BLOOD  Result Value Ref Range Status   Specimen Description BLOOD RIGHT ANTECUBITAL  Final   Special Requests   Final    BOTTLES DRAWN AEROBIC ONLY Blood Culture results may not be optimal due to an inadequate volume of blood received in culture bottles   Culture   Final    NO GROWTH 5 DAYS Performed at [facility], [address] [place], [place] [address]    Report Status 12/22/2020 FINAL  Final  Culture, blood (routine x 2)     Status: None   Collection Time: 12/17/20 11:38 AM   Specimen: BLOOD LEFT HAND  Result Value Ref Range Status   Specimen Description BLOOD LEFT HAND  Final   Special Requests   Final    BOTTLES DRAWN AEROBIC ONLY Blood Culture adequate volume   Culture   Final    NO GROWTH 5 DAYS Performed at [facility], [address], [place], [place] [address]    Report Status 12/22/2020 FINAL  Final  Culture, blood (routine x 2)     Status: None   Collection Time: 12/29/20  8:10 AM   Specimen: BLOOD RIGHT HAND  Result Value Ref Range Status   Specimen Description BLOOD RIGHT HAND  Final   Special Requests   Final    BOTTLES DRAWN AEROBIC AND ANAEROBIC Blood Culture results may not be optimal due to an inadequate volume of blood received in culture bottles   Culture   Final    NO GROWTH 5 DAYS Performed at [facility], [address] [place], [place] [address]    Report Status 01/03/2021 FINAL  Final  Culture, blood (routine x 2)     Status: None   Collection Time: 12/29/20  8:17 AM   Specimen: BLOOD RIGHT HAND  Result Value Ref Range Status   Specimen Description BLOOD RIGHT HAND  Final   Special Requests   Final  BOTTLES DRAWN AEROBIC ONLY Blood Culture  results may not be optimal due to an inadequate volume of blood received in culture bottles   Culture   Final    NO GROWTH 5 DAYS Performed at [facility], [address] [place], [place] [address]    Report Status 01/03/2021 FINAL  Final  Culture, Respiratory w Gram Stain     Status: None   Collection Time: 01/01/21 11:54 AM   Specimen: Tracheal Aspirate; Respiratory  Result Value Ref Range Status   Specimen Description TRACHEAL ASPIRATE  Final   Special Requests NONE  Final   Gram Stain   Final    RARE WBC PRESENT,BOTH PMN AND MONONUCLEAR FEW GRAM NEGATIVE RODS Performed at [facility], [address], [place], [place] [address]    Culture MODERATE PSEUDOMONAS AERUGINOSA  Final   Report Status 01/03/2021 FINAL  Final   Organism ID, Bacteria PSEUDOMONAS AERUGINOSA  Final      Susceptibility   Pseudomonas aeruginosa - MIC*    CEFTAZIDIME 4 SENSITIVE Sensitive     CIPROFLOXACIN 1 SENSITIVE Sensitive     GENTAMICIN <=1 SENSITIVE Sensitive     IMIPENEM 2 SENSITIVE Sensitive     PIP/TAZO <=4 SENSITIVE Sensitive     CEFEPIME 2 SENSITIVE Sensitive     * MODERATE PSEUDOMONAS AERUGINOSA  Culture, blood (routine x 2)     Status: None   Collection Time: 01/04/21 11:02 AM   Specimen: BLOOD RIGHT HAND  Result Value Ref Range Status   Specimen Description BLOOD RIGHT HAND  Final   Special Requests   Final    BOTTLES DRAWN AEROBIC AND ANAEROBIC Blood Culture adequate volume   Culture   Final    NO GROWTH 5 DAYS Performed at [facility], [address], [place], [place] [address]    Report Status 01/09/2021 FINAL  Final  Culture, blood (routine x 2)     Status: Abnormal   Collection Time: 01/04/21 11:11 AM   Specimen: BLOOD RIGHT FOREARM  Result Value Ref Range Status   Specimen Description BLOOD RIGHT FOREARM  Final   Special Requests   Final    BOTTLES DRAWN AEROBIC AND ANAEROBIC Blood Culture results may not be optimal due to an inadequate volume of blood  received in culture bottles   Culture  Setup Time   Final    GRAM POSITIVE COCCI IN CLUSTERS ANAEROBIC BOTTLE ONLY CRITICAL RESULT CALLED TO, READ BACK BY AND VERIFIED WITH: RN D [place] [id number] AT 1003 BY CM    Culture (A)  Final    STAPHYLOCOCCUS EPIDERMIDIS THE SIGNIFICANCE OF ISOLATING THIS ORGANISM FROM A SINGLE SET OF BLOOD CULTURES WHEN MULTIPLE SETS ARE DRAWN IS UNCERTAIN. PLEASE NOTIFY THE MICROBIOLOGY DEPARTMENT WITHIN ONE WEEK IF SPECIATION AND SENSITIVITIES ARE REQUIRED. Performed at [facility], [address], [place], [place] [address]    Report Status 01/07/2021 FINAL  Final  Culture, blood (routine x 2)     Status: None   Collection Time: 01/09/21  5:00 PM   Specimen: BLOOD  Result Value Ref Range Status   Specimen Description BLOOD RIGHT ANTECUBITAL  Final   Special Requests   Final    BOTTLES DRAWN AEROBIC AND ANAEROBIC Blood Culture results may not be optimal due to an inadequate volume of blood received in culture bottles   Culture   Final    NO GROWTH 5 DAYS Performed at [facility], [address], [place],  [place] [address]    Report Status 01/14/2021 FINAL  Final  Culture, blood (routine x 2)     Status: Abnormal   Collection Time: 01/09/21  5:04 PM   Specimen: BLOOD  Result Value Ref Range Status   Specimen Description BLOOD RIGHT ANTECUBITAL  Final   Special Requests   Final    BOTTLES DRAWN AEROBIC ONLY Blood Culture results may not be optimal due to an inadequate volume of blood received in culture bottles   Culture  Setup Time   Final    GRAM POSITIVE COCCI IN CLUSTERS AEROBIC BOTTLE ONLY CRITICAL VALUE NOTED.  VALUE IS CONSISTENT WITH PREVIOUSLY REPORTED AND CALLED VALUE.    Culture (A)  Final    STAPHYLOCOCCUS CAPITIS THE SIGNIFICANCE OF ISOLATING THIS ORGANISM FROM A SINGLE SET OF BLOOD CULTURES WHEN MULTIPLE SETS ARE DRAWN IS UNCERTAIN. PLEASE NOTIFY THE MICROBIOLOGY DEPARTMENT WITHIN ONE WEEK IF SPECIATION AND SENSITIVITIES ARE  REQUIRED. Performed at [facility], [address], [place], [place] [address]    Report Status 01/11/2021 FINAL  Final  Culture, Respiratory w Gram Stain     Status: None   Collection Time: 01/14/21  6:04 PM   Specimen: Tracheal Aspirate; Respiratory  Result Value Ref Range Status   Specimen Description TRACHEAL ASPIRATE  Final   Special Requests NONE  Final   Gram Stain   Final    RARE WBC PRESENT,BOTH PMN AND MONONUCLEAR FEW SQUAMOUS EPITHELIAL CELLS PRESENT FEW GRAM NEGATIVE RODS RARE GRAM POSITIVE COCCI Performed at [facility], [address], [place], [place] [address]    Culture MODERATE PSEUDOMONAS AERUGINOSA  Final   Report Status 01/18/2021 FINAL  Final   Organism ID, Bacteria PSEUDOMONAS AERUGINOSA  Final      Susceptibility   Pseudomonas aeruginosa - MIC*    CEFTAZIDIME 4 SENSITIVE Sensitive     CIPROFLOXACIN 2 INTERMEDIATE Intermediate     GENTAMICIN 4 SENSITIVE Sensitive     IMIPENEM >=16 RESISTANT Resistant     CEFEPIME 4 SENSITIVE Sensitive     * MODERATE PSEUDOMONAS AERUGINOSA  Culture, blood (routine x 2)     Status: None (Preliminary result)   Collection Time: 01/24/21  2:20 PM   Specimen: BLOOD LEFT HAND  Result Value Ref Range Status   Specimen Description BLOOD LEFT HAND  Final   Special Requests   Final    BOTTLES DRAWN AEROBIC AND ANAEROBIC Blood Culture adequate volume   Culture   Final    NO GROWTH 4 DAYS Performed at [facility], [address] [place], [place] [address]    Report Status PENDING  Incomplete  Culture, blood (routine x 2)     Status: None (Preliminary result)   Collection Time: 01/24/21  2:24 PM   Specimen: BLOOD LEFT HAND  Result Value Ref Range Status   Specimen Description BLOOD LEFT HAND  Final   Special Requests   Final    BOTTLES DRAWN AEROBIC AND ANAEROBIC Blood Culture results may not be optimal due to an inadequate volume of blood received in culture bottles   Culture   Final    NO GROWTH 4  DAYS Performed at [facility], [address] [place], [place] [address]    Report Status PENDING  Incomplete    Coagulation Studies: No results for input(s): LABPROT, INR in the last 72 hours.  Urinalysis: No results for input(s): COLORURINE, LABSPEC, PHURINE, GLUCOSEU, HGBUR, BILIRUBINUR, KETONESUR, PROTEINUR, UROBILINOGEN, NITRITE, LEUKOCYTESUR in the  last 72 hours.  Invalid input(s): APPERANCEUR    Imaging: No results found.   Medications:       Assessment/ Plan:  65 y.o. male with a PMHx of ESRD on HD TTS, anemia of chronic kidney disease, hypertension, acute respiratory failure status post tracheostomy placement, seizure disorder, hyperlipidemia, diabetes mellitus type 2, history of hyperkalemia, history of coronary artery disease, peripheral vascular disease who was admitted to [facility] on 11/21/2020 for ongoing treatment of acute respiratory failure status post tracheostomy placement and ESRD.   1.  ESRD on HD.    Patient seen and evaluated during hemodialysis treatment.  Tolerating well.  Ultrafiltration target 1.5 kg.  2.  Acute respiratory failure.  Patient has stable respiratory status at this time.  Continue to monitor.  3.  Anemia of chronic kidney disease.  Hemoglobin up to 7.8 posttransfusion.  Continue to monitor hemoglobin.  4.  Secondary hyperparathyroidism.  Phosphorus up to 2.5.  5.  Fever/sepsis.  Management per hospitalist and infectious disease.   LOS: 0 Deshondra Worst 7/22/20225:58 PM

## 2021-01-29 LAB — CULTURE, BLOOD (ROUTINE X 2)
Culture: NO GROWTH
Culture: NO GROWTH
Special Requests: ADEQUATE

## 2021-01-31 LAB — CBC
HCT: 21.6 % — ABNORMAL LOW (ref 39.0–52.0)
Hemoglobin: 7.2 g/dL — ABNORMAL LOW (ref 13.0–17.0)
MCH: 30.3 pg (ref 26.0–34.0)
MCHC: 33.3 g/dL (ref 30.0–36.0)
MCV: 90.8 fL (ref 80.0–100.0)
Platelets: 341 10*3/uL (ref 150–400)
RBC: 2.38 MIL/uL — ABNORMAL LOW (ref 4.22–5.81)
RDW: 18.3 % — ABNORMAL HIGH (ref 11.5–15.5)
WBC: 19.9 10*3/uL — ABNORMAL HIGH (ref 4.0–10.5)
nRBC: 0 % (ref 0.0–0.2)

## 2021-01-31 LAB — RENAL FUNCTION PANEL
Albumin: 1.8 g/dL — ABNORMAL LOW (ref 3.5–5.0)
Anion gap: 17 — ABNORMAL HIGH (ref 5–15)
BUN: 136 mg/dL — ABNORMAL HIGH (ref 8–23)
CO2: 21 mmol/L — ABNORMAL LOW (ref 22–32)
Calcium: 9.7 mg/dL (ref 8.9–10.3)
Chloride: 90 mmol/L — ABNORMAL LOW (ref 98–111)
Creatinine, Ser: 4.72 mg/dL — ABNORMAL HIGH (ref 0.61–1.24)
GFR, Estimated: 13 mL/min — ABNORMAL LOW (ref >60)
Glucose, Bld: 161 mg/dL — ABNORMAL HIGH (ref 70–99)
Phosphorus: 2.6 mg/dL (ref 2.5–4.6)
Potassium: 4.9 mmol/L (ref 3.5–5.1)
Sodium: 128 mmol/L — ABNORMAL LOW (ref 135–145)

## 2021-01-31 NOTE — Progress Notes (Signed)
[name]  ROUNDING NOTE   Subjective:  Patient sitting up in chair at the moment. Continues to have myoclonic facial twitches. Due for dialysis treatment today.   Objective:  Vital signs in last 24 hours:  Temperature 99.4 pulse 95 respirations 18 blood pressure 142/74  Physical Exam: General:  No acute distress  Head:  Normocephalic, atraumatic. 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  trace right lower extremity edema  Neurologic:  Awake, facial myoclonic twitches  Skin:  No acute rash  Access:  Right IJ temporary dialysis catheter    Basic Metabolic Panel: Recent Labs  Lab 01/26/21 0548 01/28/21 0536 01/31/21 0646  NA 128* 131* 128*  K 4.6 3.9 4.9  CL 91* 94* 90*  CO2 22 26 21*  GLUCOSE 140* 133* 161*  BUN 113* 89* 136*  CREATININE 3.65* 2.89* 4.72*  CALCIUM 9.4 9.4 9.7  PHOS 2.0* 2.5 2.6     Liver Function Tests: Recent Labs  Lab 01/26/21 0548 01/28/21 0536 01/31/21 0646  ALBUMIN 1.8* 1.8* 1.8*    No results for input(s): LIPASE, AMYLASE in the last 168 hours. No results for input(s): AMMONIA in the last 168 hours.  CBC: Recent Labs  Lab 01/26/21 0548 01/27/21 1025 01/28/21 0536 01/31/21 0646  WBC 15.5*  --  15.0* 19.9*  HGB 6.5* 9.0* 7.8* 7.2*  HCT 19.9* 27.1* 24.1* 21.6*  MCV 95.7  --  91.3 90.8  PLT 322  --  318 341     Cardiac Enzymes: No results for input(s): CKTOTAL, CKMB, CKMBINDEX, TROPONINI in the last 168 hours.  BNP: Invalid input(s): POCBNP  CBG: No results for input(s): GLUCAP in the last 168 hours.  Microbiology: Results for orders placed or performed during the hospital encounter of 11/21/20  Culture, blood (routine x 2)     Status: None   Collection Time: 11/25/20  3:25 PM   Specimen: BLOOD RIGHT HAND  Result Value Ref  Range Status   Specimen Description BLOOD RIGHT HAND  Final   Special Requests   Final    BOTTLES DRAWN AEROBIC ONLY Blood Culture results may not be optimal due to an inadequate volume of blood received in culture bottles   Culture   Final    NO GROWTH 5 DAYS Performed at [name], [address] [place], [place] [id number]    Report Status 11/30/2020 FINAL  Final  Culture, blood (routine x 2)     Status: None   Collection Time: 11/25/20  3:29 PM   Specimen: BLOOD LEFT HAND  Result Value Ref Range Status   Specimen Description BLOOD LEFT HAND  Final   Special Requests   Final    BOTTLES DRAWN AEROBIC AND ANAEROBIC Blood Culture adequate volume   Culture   Final    NO GROWTH 5 DAYS Performed at [name], [address] [place], [place] [address]    Report Status 11/30/2020 FINAL  Final  Culture, Respiratory w Gram Stain     Status: None   Collection Time: 12/06/20 10:40 AM   Specimen: Tracheal Aspirate; Respiratory  Result Value Ref Range Status   Specimen Description TRACHEAL ASPIRATE  Final   Special Requests NONE  Final   Gram Stain   Final    ABUNDANT WBC  PRESENT, PREDOMINANTLY PMN ABUNDANT GRAM NEGATIVE RODS FEW GRAM POSITIVE COCCI FEW GRAM POSITIVE RODS    Culture   Final    MODERATE PSEUDOMONAS AERUGINOSA Two isolates with different morphologies were identified as the same organism.The most resistant organism was reported. Performed at [name], [address] [place], [place] [address]    Report Status 12/09/2020 FINAL  Final   Organism ID, Bacteria PSEUDOMONAS AERUGINOSA  Final      Susceptibility   Pseudomonas aeruginosa - MIC*    CEFTAZIDIME 16 INTERMEDIATE Intermediate     CIPROFLOXACIN 1 SENSITIVE Sensitive     GENTAMICIN 2 SENSITIVE Sensitive     IMIPENEM >=16 RESISTANT Resistant     * MODERATE PSEUDOMONAS AERUGINOSA  Culture, blood (routine x 2)     Status: None   Collection Time: 12/13/20  9:09 AM   Specimen: BLOOD LEFT  HAND  Result Value Ref Range Status   Specimen Description BLOOD LEFT HAND  Final   Special Requests   Final    BOTTLES DRAWN AEROBIC AND ANAEROBIC Blood Culture results may not be optimal due to an inadequate volume of blood received in culture bottles   Culture   Final    NO GROWTH 5 DAYS Performed at [name], [address] [place], [place] [address]    Report Status 12/18/2020 FINAL  Final  Culture, blood (routine x 2)     Status: Abnormal   Collection Time: 12/13/20  9:13 AM   Specimen: BLOOD  Result Value Ref Range Status   Specimen Description BLOOD RIGHT ANTECUBITAL  Final   Special Requests   Final    BOTTLES DRAWN AEROBIC AND ANAEROBIC Blood Culture adequate volume   Culture  Setup Time   Final    GRAM POSITIVE COCCI IN CLUSTERS AEROBIC BOTTLE ONLY CRITICAL RESULT CALLED TO, READ BACK BY AND VERIFIED WITH: RN C.ROBENSON AT 1234 ON 12/14/2020 BY T.SAAD.    Culture (A)  Final    STAPHYLOCOCCUS EPIDERMIDIS THE SIGNIFICANCE OF ISOLATING THIS ORGANISM FROM A SINGLE SET OF BLOOD CULTURES WHEN MULTIPLE SETS ARE DRAWN IS UNCERTAIN. PLEASE NOTIFY THE MICROBIOLOGY DEPARTMENT WITHIN ONE WEEK IF SPECIATION AND SENSITIVITIES ARE REQUIRED. Performed at [facility], [address], [place], [place] [address]    Report Status 12/16/2020 FINAL  Final  Blood Culture ID Panel (Reflexed)     Status: Abnormal   Collection Time: 12/13/20  9:13 AM  Result Value Ref Range Status   Enterococcus faecalis NOT DETECTED NOT DETECTED Final   Enterococcus Faecium NOT DETECTED NOT DETECTED Final   Listeria monocytogenes NOT DETECTED NOT DETECTED Final   Staphylococcus species DETECTED (A) NOT DETECTED Final    Comment: CRITICAL RESULT CALLED TO, READ BACK BY AND VERIFIED WITH: RN C.ROBERTSON AT 1234 ON 12/14/2020 BY T.SAAD.    Staphylococcus aureus (BCID) NOT DETECTED NOT DETECTED Final   Staphylococcus epidermidis DETECTED (A) NOT DETECTED Final    Comment: Methicillin  (oxacillin) resistant coagulase negative staphylococcus. Possible blood culture contaminant (unless isolated from more than one blood culture draw or clinical case suggests pathogenicity). No antibiotic treatment is indicated for blood  culture contaminants. CRITICAL RESULT CALLED TO, READ BACK BY AND VERIFIED WITH: RN C.ROBERTSON AT 1234 ON 12/14/2020 BY T.SAAD.    Staphylococcus lugdunensis NOT DETECTED NOT DETECTED Final   Streptococcus species NOT DETECTED NOT DETECTED Final   Streptococcus agalactiae NOT DETECTED NOT DETECTED Final   Streptococcus pneumoniae NOT DETECTED NOT DETECTED Final  Streptococcus pyogenes NOT DETECTED NOT DETECTED Final   A.calcoaceticus-baumannii NOT DETECTED NOT DETECTED Final   Bacteroides fragilis NOT DETECTED NOT DETECTED Final   Enterobacterales NOT DETECTED NOT DETECTED Final   Enterobacter cloacae complex NOT DETECTED NOT DETECTED Final   Escherichia coli NOT DETECTED NOT DETECTED Final   Klebsiella aerogenes NOT DETECTED NOT DETECTED Final   Klebsiella oxytoca NOT DETECTED NOT DETECTED Final   Klebsiella pneumoniae NOT DETECTED NOT DETECTED Final   Proteus species NOT DETECTED NOT DETECTED Final   Salmonella species NOT DETECTED NOT DETECTED Final   Serratia marcescens NOT DETECTED NOT DETECTED Final   Haemophilus influenzae NOT DETECTED NOT DETECTED Final   Neisseria meningitidis NOT DETECTED NOT DETECTED Final   Pseudomonas aeruginosa NOT DETECTED NOT DETECTED Final   Stenotrophomonas maltophilia NOT DETECTED NOT DETECTED Final   Candida albicans NOT DETECTED NOT DETECTED Final   Candida auris NOT DETECTED NOT DETECTED Final   Candida glabrata NOT DETECTED NOT DETECTED Final   Candida krusei NOT DETECTED NOT DETECTED Final   Candida parapsilosis NOT DETECTED NOT DETECTED Final   Candida tropicalis NOT DETECTED NOT DETECTED Final   Cryptococcus neoformans/gattii NOT DETECTED NOT DETECTED Final   Methicillin resistance mecA/C DETECTED (A) NOT  DETECTED Final    Comment: CRITICAL RESULT CALLED TO, READ BACK BY AND VERIFIED WITH: RN C.ROBERTSON AT 1234 ON 12/14/2020 BY T.SAAD. Performed at [facility], [address] [place], [place] [address]   Culture, blood (routine x 2)     Status: None   Collection Time: 12/17/20 11:31 AM   Specimen: BLOOD  Result Value Ref Range Status   Specimen Description BLOOD RIGHT ANTECUBITAL  Final   Special Requests   Final    BOTTLES DRAWN AEROBIC ONLY Blood Culture results may not be optimal due to an inadequate volume of blood received in culture bottles   Culture   Final    NO GROWTH 5 DAYS Performed at [facility], [address], [place], [place] [address]    Report Status 12/22/2020 FINAL  Final  Culture, blood (routine x 2)     Status: None   Collection Time: 12/17/20 11:38 AM   Specimen: BLOOD LEFT HAND  Result Value Ref Range Status   Specimen Description BLOOD LEFT HAND  Final   Special Requests   Final    BOTTLES DRAWN AEROBIC ONLY Blood Culture adequate volume   Culture   Final    NO GROWTH 5 DAYS Performed at [facility], [address] [place], [place] [address]    Report Status 12/22/2020 FINAL  Final  Culture, blood (routine x 2)     Status: None   Collection Time: 12/29/20  8:10 AM   Specimen: BLOOD RIGHT HAND  Result Value Ref Range Status   Specimen Description BLOOD RIGHT HAND  Final   Special Requests   Final    BOTTLES DRAWN AEROBIC AND ANAEROBIC Blood Culture results may not be optimal due to an inadequate volume of blood received in culture bottles   Culture   Final    NO GROWTH 5 DAYS Performed at [facility], [address], [place], [place] [address]    Report Status 01/03/2021 FINAL  Final  Culture, blood (routine x 2)     Status: None   Collection Time: 12/29/20  8:17 AM   Specimen: BLOOD RIGHT HAND  Result Value Ref Range Status   Specimen Description BLOOD RIGHT HAND  Final  Special Requests   Final    BOTTLES DRAWN  AEROBIC ONLY Blood Culture results may not be optimal due to an inadequate volume of blood received in culture bottles   Culture   Final    NO GROWTH 5 DAYS Performed at [facility], [address], [place], [place] [address]    Report Status 01/03/2021 FINAL  Final  Culture, Respiratory w Gram Stain     Status: None   Collection Time: 01/01/21 11:54 AM   Specimen: Tracheal Aspirate; Respiratory  Result Value Ref Range Status   Specimen Description TRACHEAL ASPIRATE  Final   Special Requests NONE  Final   Gram Stain   Final    RARE WBC PRESENT,BOTH PMN AND MONONUCLEAR FEW GRAM NEGATIVE RODS Performed at [facility], [address], [place], [place] [address]    Culture MODERATE PSEUDOMONAS AERUGINOSA  Final   Report Status 01/03/2021 FINAL  Final   Organism ID, Bacteria PSEUDOMONAS AERUGINOSA  Final      Susceptibility   Pseudomonas aeruginosa - MIC*    CEFTAZIDIME 4 SENSITIVE Sensitive     CIPROFLOXACIN 1 SENSITIVE Sensitive     GENTAMICIN <=1 SENSITIVE Sensitive     IMIPENEM 2 SENSITIVE Sensitive     PIP/TAZO <=4 SENSITIVE Sensitive     CEFEPIME 2 SENSITIVE Sensitive     * MODERATE PSEUDOMONAS AERUGINOSA  Culture, blood (routine x 2)     Status: None   Collection Time: 01/04/21 11:02 AM   Specimen: BLOOD RIGHT HAND  Result Value Ref Range Status   Specimen Description BLOOD RIGHT HAND  Final   Special Requests   Final    BOTTLES DRAWN AEROBIC AND ANAEROBIC Blood Culture adequate volume   Culture   Final    NO GROWTH 5 DAYS Performed at [facility], [address] [place], [place] [address]    Report Status 01/09/2021 FINAL  Final  Culture, blood (routine x 2)     Status: Abnormal   Collection Time: 01/04/21 11:11 AM   Specimen: BLOOD RIGHT FOREARM  Result Value Ref Range Status   Specimen Description BLOOD RIGHT FOREARM  Final   Special Requests   Final    BOTTLES DRAWN AEROBIC AND ANAEROBIC Blood Culture results may not be optimal due to an  inadequate volume of blood received in culture bottles   Culture  Setup Time   Final    GRAM POSITIVE COCCI IN CLUSTERS ANAEROBIC BOTTLE ONLY CRITICAL RESULT CALLED TO, READ BACK BY AND VERIFIED WITH: RN D [place] [id number] AT 1003 BY CM    Culture (A)  Final    STAPHYLOCOCCUS EPIDERMIDIS THE SIGNIFICANCE OF ISOLATING THIS ORGANISM FROM A SINGLE SET OF BLOOD CULTURES WHEN MULTIPLE SETS ARE DRAWN IS UNCERTAIN. PLEASE NOTIFY THE MICROBIOLOGY DEPARTMENT WITHIN ONE WEEK IF SPECIATION AND SENSITIVITIES ARE REQUIRED. Performed at [facility], [address], [place], [place] [address]    Report Status 01/07/2021 FINAL  Final  Culture, blood (routine x 2)     Status: None   Collection Time: 01/09/21  5:00 PM   Specimen: BLOOD  Result Value Ref Range Status   Specimen Description BLOOD RIGHT ANTECUBITAL  Final   Special Requests   Final    BOTTLES DRAWN AEROBIC AND ANAEROBIC Blood Culture results may not be optimal due to an inadequate volume of blood received in culture bottles   Culture   Final    NO GROWTH 5 DAYS Performed at [facility], [address] [place], [place] [address]    Report Status 01/14/2021 FINAL  Final  Culture, blood (routine x 2)     Status: Abnormal   Collection Time: 01/09/21  5:04 PM   Specimen: BLOOD  Result Value Ref Range Status   Specimen Description BLOOD RIGHT ANTECUBITAL  Final   Special Requests   Final    BOTTLES DRAWN AEROBIC ONLY Blood Culture results may not be optimal due to an inadequate volume of blood received in culture bottles   Culture  Setup Time   Final    GRAM POSITIVE COCCI IN CLUSTERS AEROBIC BOTTLE ONLY CRITICAL VALUE NOTED.  VALUE IS CONSISTENT WITH PREVIOUSLY REPORTED AND CALLED VALUE.    Culture (A)  Final    STAPHYLOCOCCUS CAPITIS THE SIGNIFICANCE OF ISOLATING THIS ORGANISM FROM A SINGLE SET OF BLOOD CULTURES WHEN MULTIPLE SETS ARE DRAWN IS UNCERTAIN. PLEASE NOTIFY THE MICROBIOLOGY DEPARTMENT WITHIN ONE WEEK IF  SPECIATION AND SENSITIVITIES ARE REQUIRED. Performed at [facility], [address] [place], [place] [address]    Report Status 01/11/2021 FINAL  Final  Culture, Respiratory w Gram Stain     Status: None   Collection Time: 01/14/21  6:04 PM   Specimen: Tracheal Aspirate; Respiratory  Result Value Ref Range Status   Specimen Description TRACHEAL ASPIRATE  Final   Special Requests NONE  Final   Gram Stain   Final    RARE WBC PRESENT,BOTH PMN AND MONONUCLEAR FEW SQUAMOUS EPITHELIAL CELLS PRESENT FEW GRAM NEGATIVE RODS RARE GRAM POSITIVE COCCI Performed at [facility], [address], [place], [place] [address]    Culture MODERATE PSEUDOMONAS AERUGINOSA  Final   Report Status 01/18/2021 FINAL  Final   Organism ID, Bacteria PSEUDOMONAS AERUGINOSA  Final      Susceptibility   Pseudomonas aeruginosa - MIC*    CEFTAZIDIME 4 SENSITIVE Sensitive     CIPROFLOXACIN 2 INTERMEDIATE Intermediate     GENTAMICIN 4 SENSITIVE Sensitive     IMIPENEM >=16 RESISTANT Resistant     CEFEPIME 4 SENSITIVE Sensitive     * MODERATE PSEUDOMONAS AERUGINOSA  Culture, blood (routine x 2)     Status: None   Collection Time: 01/24/21  2:20 PM   Specimen: BLOOD LEFT HAND  Result Value Ref Range Status   Specimen Description BLOOD LEFT HAND  Final   Special Requests   Final    BOTTLES DRAWN AEROBIC AND ANAEROBIC Blood Culture adequate volume   Culture   Final    NO GROWTH 5 DAYS Performed at [facility], [address] [place], [place] [address]    Report Status 01/29/2021 FINAL  Final  Culture, blood (routine x 2)     Status: None   Collection Time: 01/24/21  2:24 PM   Specimen: BLOOD LEFT HAND  Result Value Ref Range Status   Specimen Description BLOOD LEFT HAND  Final   Special Requests   Final    BOTTLES DRAWN AEROBIC AND ANAEROBIC Blood Culture results may not be optimal due to an inadequate volume of blood received in culture bottles   Culture   Final    NO GROWTH 5  DAYS Performed at [facility], [address] [place], [place] [address]    Report Status 01/29/2021 FINAL  Final    Coagulation Studies: No results for input(s): LABPROT, INR in the last 72 hours.  Urinalysis: No results for input(s): COLORURINE, LABSPEC, PHURINE, GLUCOSEU, HGBUR, BILIRUBINUR, KETONESUR,  PROTEINUR, UROBILINOGEN, NITRITE, LEUKOCYTESUR in the last 72 hours.  Invalid input(s): APPERANCEUR    Imaging: No results found.   Medications:       Assessment/ Plan:  65 y.o. male with a PMHx of ESRD on HD TTS, anemia of chronic kidney disease, hypertension, acute respiratory failure status post tracheostomy placement, seizure disorder, hyperlipidemia, diabetes mellitus type 2, history of hyperkalemia, history of coronary artery disease, peripheral vascular disease who was admitted to [facility] on 11/21/2020 for ongoing treatment of acute respiratory failure status post tracheostomy placement and ESRD.   1.  ESRD on HD.    Patient due for hemodialysis treatment today.  Orders have been prepared.  2.  Acute respiratory failure.  Patient continues to do well off the ventilator.  3.  Anemia of chronic kidney disease.  Hemoglobin drifted down again over the weekend to 7.2.  Required blood transfusion last week.  Continue to monitor CBC.  4.  Secondary hyperparathyroidism.  Phosphorus currently 2.6.  5.  Fever/sepsis.  Febrile again this a.m. to 100.5.  Evaluation management per infectious disease and hospitalist.   LOS: 0 Joel Hebert 7/25/20228:30 AM

## 2021-02-01 ENCOUNTER — Other Ambulatory Visit (HOSPITAL_COMMUNITY): Payer: Medicare Other

## 2021-02-02 LAB — CBC
HCT: 21.6 % — ABNORMAL LOW (ref 39.0–52.0)
Hemoglobin: 6.8 g/dL — CL (ref 13.0–17.0)
MCH: 30.2 pg (ref 26.0–34.0)
MCHC: 31.5 g/dL (ref 30.0–36.0)
MCV: 96 fL (ref 80.0–100.0)
Platelets: 330 10*3/uL (ref 150–400)
RBC: 2.25 MIL/uL — ABNORMAL LOW (ref 4.22–5.81)
RDW: 18.7 % — ABNORMAL HIGH (ref 11.5–15.5)
WBC: 19.6 10*3/uL — ABNORMAL HIGH (ref 4.0–10.5)
nRBC: 0 % (ref 0.0–0.2)

## 2021-02-02 LAB — RENAL FUNCTION PANEL
Albumin: 1.9 g/dL — ABNORMAL LOW (ref 3.5–5.0)
Anion gap: 16 — ABNORMAL HIGH (ref 5–15)
BUN: 124 mg/dL — ABNORMAL HIGH (ref 8–23)
CO2: 23 mmol/L (ref 22–32)
Calcium: 9.9 mg/dL (ref 8.9–10.3)
Chloride: 92 mmol/L — ABNORMAL LOW (ref 98–111)
Creatinine, Ser: 4.42 mg/dL — ABNORMAL HIGH (ref 0.61–1.24)
GFR, Estimated: 14 mL/min — ABNORMAL LOW (ref >60)
Glucose, Bld: 194 mg/dL — ABNORMAL HIGH (ref 70–99)
Phosphorus: 2.8 mg/dL (ref 2.5–4.6)
Potassium: 4.8 mmol/L (ref 3.5–5.1)
Sodium: 131 mmol/L — ABNORMAL LOW (ref 135–145)

## 2021-02-02 LAB — PREPARE RBC (CROSSMATCH)

## 2021-02-02 NOTE — Progress Notes (Signed)
[facility]  ROUNDING NOTE   Subjective:  Patient laying in bed. Resting comfortably at the moment. Patient to receive hemodialysis treatment today.   Objective:  Vital signs in last 24 hours:  Temperature 98.6 pulse 84 respirations 18 blood pressure 110/45  Physical Exam: General:  No acute distress  Head:  Normocephalic, atraumatic. 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  trace right lower extremity edema  Neurologic: Resting comfortably  Skin:  No acute rash  Access:  Right IJ temporary dialysis catheter    Basic Metabolic Panel: Recent Labs  Lab 01/28/21 0536 01/31/21 0646 02/02/21 0438  NA 131* 128* 131*  K 3.9 4.9 4.8  CL 94* 90* 92*  CO2 26 21* 23  GLUCOSE 133* 161* 194*  BUN 89* 136* 124*  CREATININE 2.89* 4.72* 4.42*  CALCIUM 9.4 9.7 9.9  PHOS 2.5 2.6 2.8     Liver Function Tests: Recent Labs  Lab 01/28/21 0536 01/31/21 0646 02/02/21 0438  ALBUMIN 1.8* 1.8* 1.9*    No results for input(s): LIPASE, AMYLASE in the last 168 hours. No results for input(s): AMMONIA in the last 168 hours.  CBC: Recent Labs  Lab 01/27/21 1025 01/28/21 0536 01/31/21 0646 02/02/21 0438  WBC  --  15.0* 19.9* 19.6*  HGB 9.0* 7.8* 7.2* 6.8*  HCT 27.1* 24.1* 21.6* 21.6*  MCV  --  91.3 90.8 96.0  PLT  --  318 341 330     Cardiac Enzymes: No results for input(s): CKTOTAL, CKMB, CKMBINDEX, TROPONINI in the last 168 hours.  BNP: Invalid input(s): POCBNP  CBG: No results for input(s): GLUCAP in the last 168 hours.  Microbiology: Results for orders placed or performed during the hospital encounter of 11/21/20  Culture, blood (routine x 2)     Status: None   Collection Time: 11/25/20  3:25 PM   Specimen: BLOOD RIGHT HAND  Result Value Ref Range Status   Specimen  Description BLOOD RIGHT HAND  Final   Special Requests   Final    BOTTLES DRAWN AEROBIC ONLY Blood Culture results may not be optimal due to an inadequate volume of blood received in culture bottles   Culture   Final    NO GROWTH 5 DAYS Performed at [facility], [address] [place], [place] [address]    Report Status 11/30/2020 FINAL  Final  Culture, blood (routine x 2)     Status: None   Collection Time: 11/25/20  3:29 PM   Specimen: BLOOD LEFT HAND  Result Value Ref Range Status   Specimen Description BLOOD LEFT HAND  Final   Special Requests   Final    BOTTLES DRAWN AEROBIC AND ANAEROBIC Blood Culture adequate volume   Culture   Final    NO GROWTH 5 DAYS Performed at [facility], [address], [place], [place] [address]    Report Status 11/30/2020 FINAL  Final  Culture, Respiratory w Gram Stain     Status: None   Collection Time: 12/06/20 10:40 AM   Specimen: Tracheal Aspirate; Respiratory  Result Value Ref Range Status   Specimen Description TRACHEAL ASPIRATE  Final   Special Requests NONE  Final   Gram Stain   Final    ABUNDANT WBC PRESENT, PREDOMINANTLY PMN ABUNDANT GRAM NEGATIVE RODS  FEW GRAM POSITIVE COCCI FEW GRAM POSITIVE RODS    Culture   Final    MODERATE PSEUDOMONAS AERUGINOSA Two isolates with different morphologies were identified as the same organism.The most resistant organism was reported. Performed at [facility], [address], [place], [place] [address]    Report Status 12/09/2020 FINAL  Final   Organism ID, Bacteria PSEUDOMONAS AERUGINOSA  Final      Susceptibility   Pseudomonas aeruginosa - MIC*    CEFTAZIDIME 16 INTERMEDIATE Intermediate     CIPROFLOXACIN 1 SENSITIVE Sensitive     GENTAMICIN 2 SENSITIVE Sensitive     IMIPENEM >=16 RESISTANT Resistant     * MODERATE PSEUDOMONAS AERUGINOSA  Culture, blood (routine x 2)     Status: None   Collection Time: 12/13/20  9:09 AM   Specimen: BLOOD LEFT HAND  Result Value Ref  Range Status   Specimen Description BLOOD LEFT HAND  Final   Special Requests   Final    BOTTLES DRAWN AEROBIC AND ANAEROBIC Blood Culture results may not be optimal due to an inadequate volume of blood received in culture bottles   Culture   Final    NO GROWTH 5 DAYS Performed at [facility], [address], [place], [place] [address]    Report Status 12/18/2020 FINAL  Final  Culture, blood (routine x 2)     Status: Abnormal   Collection Time: 12/13/20  9:13 AM   Specimen: BLOOD  Result Value Ref Range Status   Specimen Description BLOOD RIGHT ANTECUBITAL  Final   Special Requests   Final    BOTTLES DRAWN AEROBIC AND ANAEROBIC Blood Culture adequate volume   Culture  Setup Time   Final    GRAM POSITIVE COCCI IN CLUSTERS AEROBIC BOTTLE ONLY CRITICAL RESULT CALLED TO, READ BACK BY AND VERIFIED WITH: RN C.ROBENSON AT 1234 ON 12/14/2020 BY T.SAAD.    Culture (A)  Final    STAPHYLOCOCCUS EPIDERMIDIS THE SIGNIFICANCE OF ISOLATING THIS ORGANISM FROM A SINGLE SET OF BLOOD CULTURES WHEN MULTIPLE SETS ARE DRAWN IS UNCERTAIN. PLEASE NOTIFY THE MICROBIOLOGY DEPARTMENT WITHIN ONE WEEK IF SPECIATION AND SENSITIVITIES ARE REQUIRED. Performed at [facility], [address], [place], [place] [address]    Report Status 12/16/2020 FINAL  Final  Blood Culture ID Panel (Reflexed)     Status: Abnormal   Collection Time: 12/13/20  9:13 AM  Result Value Ref Range Status   Enterococcus faecalis NOT DETECTED NOT DETECTED Final   Enterococcus Faecium NOT DETECTED NOT DETECTED Final   Listeria monocytogenes NOT DETECTED NOT DETECTED Final   Staphylococcus species DETECTED (A) NOT DETECTED Final    Comment: CRITICAL RESULT CALLED TO, READ BACK BY AND VERIFIED WITH: RN C.ROBERTSON AT 1234 ON 12/14/2020 BY T.SAAD.    Staphylococcus aureus (BCID) NOT DETECTED NOT DETECTED Final   Staphylococcus epidermidis DETECTED (A) NOT DETECTED Final    Comment: Methicillin (oxacillin) resistant coagulase  negative staphylococcus. Possible blood culture contaminant (unless isolated from more than one blood culture draw or clinical case suggests pathogenicity). No antibiotic treatment is indicated for blood  culture contaminants. CRITICAL RESULT CALLED TO, READ BACK BY AND VERIFIED WITH: RN C.ROBERTSON AT 1234 ON 12/14/2020 BY T.SAAD.    Staphylococcus lugdunensis NOT DETECTED NOT DETECTED Final   Streptococcus species NOT DETECTED NOT DETECTED Final   Streptococcus agalactiae NOT DETECTED NOT DETECTED Final   Streptococcus pneumoniae NOT DETECTED NOT DETECTED Final   Streptococcus pyogenes NOT DETECTED NOT DETECTED Final  A.calcoaceticus-baumannii NOT DETECTED NOT DETECTED Final   Bacteroides fragilis NOT DETECTED NOT DETECTED Final   Enterobacterales NOT DETECTED NOT DETECTED Final   Enterobacter cloacae complex NOT DETECTED NOT DETECTED Final   Escherichia coli NOT DETECTED NOT DETECTED Final   Klebsiella aerogenes NOT DETECTED NOT DETECTED Final   Klebsiella oxytoca NOT DETECTED NOT DETECTED Final   Klebsiella pneumoniae NOT DETECTED NOT DETECTED Final   Proteus species NOT DETECTED NOT DETECTED Final   Salmonella species NOT DETECTED NOT DETECTED Final   Serratia marcescens NOT DETECTED NOT DETECTED Final   Haemophilus influenzae NOT DETECTED NOT DETECTED Final   Neisseria meningitidis NOT DETECTED NOT DETECTED Final   Pseudomonas aeruginosa NOT DETECTED NOT DETECTED Final   Stenotrophomonas maltophilia NOT DETECTED NOT DETECTED Final   Candida albicans NOT DETECTED NOT DETECTED Final   Candida auris NOT DETECTED NOT DETECTED Final   Candida glabrata NOT DETECTED NOT DETECTED Final   Candida krusei NOT DETECTED NOT DETECTED Final   Candida parapsilosis NOT DETECTED NOT DETECTED Final   Candida tropicalis NOT DETECTED NOT DETECTED Final   Cryptococcus neoformans/gattii NOT DETECTED NOT DETECTED Final   Methicillin resistance mecA/C DETECTED (A) NOT DETECTED Final    Comment:  CRITICAL RESULT CALLED TO, READ BACK BY AND VERIFIED WITH: RN C.ROBERTSON AT 1234 ON 12/14/2020 BY T.SAAD. Performed at [facility], [address] [place], [place] [address]   Culture, blood (routine x 2)     Status: None   Collection Time: 12/17/20 11:31 AM   Specimen: BLOOD  Result Value Ref Range Status   Specimen Description BLOOD RIGHT ANTECUBITAL  Final   Special Requests   Final    BOTTLES DRAWN AEROBIC ONLY Blood Culture results may not be optimal due to an inadequate volume of blood received in culture bottles   Culture   Final    NO GROWTH 5 DAYS Performed at [facility], [address] [place], [place] [address]    Report Status 12/22/2020 FINAL  Final  Culture, blood (routine x 2)     Status: None   Collection Time: 12/17/20 11:38 AM   Specimen: BLOOD LEFT HAND  Result Value Ref Range Status   Specimen Description BLOOD LEFT HAND  Final   Special Requests   Final    BOTTLES DRAWN AEROBIC ONLY Blood Culture adequate volume   Culture   Final    NO GROWTH 5 DAYS Performed at [facility], [address] [place], [place] [address]    Report Status 12/22/2020 FINAL  Final  Culture, blood (routine x 2)     Status: None   Collection Time: 12/29/20  8:10 AM   Specimen: BLOOD RIGHT HAND  Result Value Ref Range Status   Specimen Description BLOOD RIGHT HAND  Final   Special Requests   Final    BOTTLES DRAWN AEROBIC AND ANAEROBIC Blood Culture results may not be optimal due to an inadequate volume of blood received in culture bottles   Culture   Final    NO GROWTH 5 DAYS Performed at [facility], [address] [place], [place] [address]    Report Status 01/03/2021 FINAL  Final  Culture, blood (routine x 2)     Status: None   Collection Time: 12/29/20  8:17 AM   Specimen: BLOOD RIGHT HAND  Result Value Ref Range Status   Specimen Description BLOOD RIGHT HAND  Final   Special Requests   Final  BOTTLES DRAWN AEROBIC ONLY Blood Culture  results may not be optimal due to an inadequate volume of blood received in culture bottles   Culture   Final    NO GROWTH 5 DAYS Performed at [facility], [address] [place], [place] [address]    Report Status 01/03/2021 FINAL  Final  Culture, Respiratory w Gram Stain     Status: None   Collection Time: 01/01/21 11:54 AM   Specimen: Tracheal Aspirate; Respiratory  Result Value Ref Range Status   Specimen Description TRACHEAL ASPIRATE  Final   Special Requests NONE  Final   Gram Stain   Final    RARE WBC PRESENT,BOTH PMN AND MONONUCLEAR FEW GRAM NEGATIVE RODS Performed at [facility], [address], [place], [place] [address]    Culture MODERATE PSEUDOMONAS AERUGINOSA  Final   Report Status 01/03/2021 FINAL  Final   Organism ID, Bacteria PSEUDOMONAS AERUGINOSA  Final      Susceptibility   Pseudomonas aeruginosa - MIC*    CEFTAZIDIME 4 SENSITIVE Sensitive     CIPROFLOXACIN 1 SENSITIVE Sensitive     GENTAMICIN <=1 SENSITIVE Sensitive     IMIPENEM 2 SENSITIVE Sensitive     PIP/TAZO <=4 SENSITIVE Sensitive     CEFEPIME 2 SENSITIVE Sensitive     * MODERATE PSEUDOMONAS AERUGINOSA  Culture, blood (routine x 2)     Status: None   Collection Time: 01/04/21 11:02 AM   Specimen: BLOOD RIGHT HAND  Result Value Ref Range Status   Specimen Description BLOOD RIGHT HAND  Final   Special Requests   Final    BOTTLES DRAWN AEROBIC AND ANAEROBIC Blood Culture adequate volume   Culture   Final    NO GROWTH 5 DAYS Performed at [facility], [address], [place], [place] [address]    Report Status 01/09/2021 FINAL  Final  Culture, blood (routine x 2)     Status: Abnormal   Collection Time: 01/04/21 11:11 AM   Specimen: BLOOD RIGHT FOREARM  Result Value Ref Range Status   Specimen Description BLOOD RIGHT FOREARM  Final   Special Requests   Final    BOTTLES DRAWN AEROBIC AND ANAEROBIC Blood Culture results may not be optimal due to an inadequate volume of blood  received in culture bottles   Culture  Setup Time   Final    GRAM POSITIVE COCCI IN CLUSTERS ANAEROBIC BOTTLE ONLY CRITICAL RESULT CALLED TO, READ BACK BY AND VERIFIED WITH: RN D [place] [id number] AT 1003 BY CM    Culture (A)  Final    STAPHYLOCOCCUS EPIDERMIDIS THE SIGNIFICANCE OF ISOLATING THIS ORGANISM FROM A SINGLE SET OF BLOOD CULTURES WHEN MULTIPLE SETS ARE DRAWN IS UNCERTAIN. PLEASE NOTIFY THE MICROBIOLOGY DEPARTMENT WITHIN ONE WEEK IF SPECIATION AND SENSITIVITIES ARE REQUIRED. Performed at [facility], [address], [place], [place] [address]    Report Status 01/07/2021 FINAL  Final  Culture, blood (routine x 2)     Status: None   Collection Time: 01/09/21  5:00 PM   Specimen: BLOOD  Result Value Ref Range Status   Specimen Description BLOOD RIGHT ANTECUBITAL  Final   Special Requests   Final    BOTTLES DRAWN AEROBIC AND ANAEROBIC Blood Culture results may not be optimal due to an inadequate volume of blood received in culture bottles   Culture   Final    NO GROWTH 5 DAYS Performed at [facility], [address], [place],  [place] [address]    Report Status 01/14/2021 FINAL  Final  Culture, blood (routine x 2)     Status: Abnormal   Collection Time: 01/09/21  5:04 PM   Specimen: BLOOD  Result Value Ref Range Status   Specimen Description BLOOD RIGHT ANTECUBITAL  Final   Special Requests   Final    BOTTLES DRAWN AEROBIC ONLY Blood Culture results may not be optimal due to an inadequate volume of blood received in culture bottles   Culture  Setup Time   Final    GRAM POSITIVE COCCI IN CLUSTERS AEROBIC BOTTLE ONLY CRITICAL VALUE NOTED.  VALUE IS CONSISTENT WITH PREVIOUSLY REPORTED AND CALLED VALUE.    Culture (A)  Final    STAPHYLOCOCCUS CAPITIS THE SIGNIFICANCE OF ISOLATING THIS ORGANISM FROM A SINGLE SET OF BLOOD CULTURES WHEN MULTIPLE SETS ARE DRAWN IS UNCERTAIN. PLEASE NOTIFY THE MICROBIOLOGY DEPARTMENT WITHIN ONE WEEK IF SPECIATION AND SENSITIVITIES ARE  REQUIRED. Performed at [facility], [address], [place], [place] [address]    Report Status 01/11/2021 FINAL  Final  Culture, Respiratory w Gram Stain     Status: None   Collection Time: 01/14/21  6:04 PM   Specimen: Tracheal Aspirate; Respiratory  Result Value Ref Range Status   Specimen Description TRACHEAL ASPIRATE  Final   Special Requests NONE  Final   Gram Stain   Final    RARE WBC PRESENT,BOTH PMN AND MONONUCLEAR FEW SQUAMOUS EPITHELIAL CELLS PRESENT FEW GRAM NEGATIVE RODS RARE GRAM POSITIVE COCCI Performed at [facility], [address] [place], [place] [address]    Culture MODERATE PSEUDOMONAS AERUGINOSA  Final   Report Status 01/18/2021 FINAL  Final   Organism ID, Bacteria PSEUDOMONAS AERUGINOSA  Final      Susceptibility   Pseudomonas aeruginosa - MIC*    CEFTAZIDIME 4 SENSITIVE Sensitive     CIPROFLOXACIN 2 INTERMEDIATE Intermediate     GENTAMICIN 4 SENSITIVE Sensitive     IMIPENEM >=16 RESISTANT Resistant     CEFEPIME 4 SENSITIVE Sensitive     * MODERATE PSEUDOMONAS AERUGINOSA  Culture, blood (routine x 2)     Status: None   Collection Time: 01/24/21  2:20 PM   Specimen: BLOOD LEFT HAND  Result Value Ref Range Status   Specimen Description BLOOD LEFT HAND  Final   Special Requests   Final    BOTTLES DRAWN AEROBIC AND ANAEROBIC Blood Culture adequate volume   Culture   Final    NO GROWTH 5 DAYS Performed at [facility], [address] [place], [place] [address]    Report Status 01/29/2021 FINAL  Final  Culture, blood (routine x 2)     Status: None   Collection Time: 01/24/21  2:24 PM   Specimen: BLOOD LEFT HAND  Result Value Ref Range Status   Specimen Description BLOOD LEFT HAND  Final   Special Requests   Final    BOTTLES DRAWN AEROBIC AND ANAEROBIC Blood Culture results may not be optimal due to an inadequate volume of blood received in culture bottles   Culture   Final    NO GROWTH 5 DAYS Performed at [facility],  [address] [place], [place] [address]    Report Status 01/29/2021 FINAL  Final  Culture, Respiratory w Gram Stain     Status: None (Preliminary result)   Collection Time: 02/01/21 12:02 PM   Specimen: Tracheal Aspirate; Respiratory  Result Value Ref Range Status   Specimen Description  TRACHEAL ASPIRATE  Final   Special Requests NONE  Final   Gram Stain   Final    NO WBC SEEN FEW GRAM VARIABLE ROD Performed at [facility], [address], [place], [place] [address]    Culture PENDING  Incomplete   Report Status PENDING  Incomplete    Coagulation Studies: No results for input(s): LABPROT, INR in the last 72 hours.  Urinalysis: No results for input(s): COLORURINE, LABSPEC, PHURINE, GLUCOSEU, HGBUR, BILIRUBINUR, KETONESUR, PROTEINUR, UROBILINOGEN, NITRITE, LEUKOCYTESUR in the last 72 hours.  Invalid input(s): APPERANCEUR    Imaging: DG Chest Port 1 View  Result Date: 02/01/2021 CLINICAL DATA:  Elevated white blood cell count. EXAM: PORTABLE CHEST 1 VIEW COMPARISON:  01/04/2021. FINDINGS: Right IJ line noted with tip over SVC. Tracheostomy tube in stable position. Heart size normal. Low lung volumes. Mild infiltrate right upper lung cannot be excluded. Follow-up exam suggested demonstrate resolution. No pleural effusion or pneumothorax. Degenerative change thoracic spine. IMPRESSION: 1.  Tracheostomy tube and right IJ line in good anatomic position. 2. Mild right upper lung infiltrate cannot be excluded. Follow-up exam suggested to demonstrate resolution. Electronically Signed   By: Maisie Fus  Register   On: 02/01/2021 13:10     Medications:       Assessment/ Plan:  65 y.o. male with a PMHx of ESRD on HD TTS, anemia of chronic kidney disease, hypertension, acute respiratory failure status post tracheostomy placement, seizure disorder, hyperlipidemia, diabetes mellitus type 2, history of hyperkalemia, history of coronary artery disease, peripheral vascular disease who was  admitted to [facility] on 11/21/2020 for ongoing treatment of acute respiratory failure status post tracheostomy placement and ESRD.   1.  ESRD on HD.    We are planning for hemodialysis treatment for today.  Continue dialysis on MWF treatment days.  2.  Acute respiratory failure.  Patient breathing comfortably through T-piece via his tracheostomy.  3.  Anemia of chronic kidney disease.  Hemoglobin down to 6.8.  Consider blood transfusion but defer to primary team.  4.  Secondary hyperparathyroidism.  Serum phosphorus acceptable at 2.8.  Continue to periodically monitor.  5.  Fever/sepsis.  Work-up per hospitalist and ID.   LOS: 0 Quintrell Baze 7/27/20228:22 AM

## 2021-02-03 LAB — BPAM RBC
Blood Product Expiration Date: 202208192359
ISSUE DATE / TIME: 202207270959
Unit Type and Rh: 5100

## 2021-02-03 LAB — TYPE AND SCREEN
ABO/RH(D): O POS
Antibody Screen: NEGATIVE
Unit division: 0

## 2021-02-03 LAB — HEMOGLOBIN AND HEMATOCRIT, BLOOD
HCT: 29.2 % — ABNORMAL LOW (ref 39.0–52.0)
Hemoglobin: 9.6 g/dL — ABNORMAL LOW (ref 13.0–17.0)

## 2021-02-04 LAB — CULTURE, RESPIRATORY W GRAM STAIN: Gram Stain: NONE SEEN

## 2021-02-04 LAB — RENAL FUNCTION PANEL
Albumin: 1.7 g/dL — ABNORMAL LOW (ref 3.5–5.0)
Anion gap: 19 — ABNORMAL HIGH (ref 5–15)
BUN: 139 mg/dL — ABNORMAL HIGH (ref 8–23)
CO2: 22 mmol/L (ref 22–32)
Calcium: 9.7 mg/dL (ref 8.9–10.3)
Chloride: 90 mmol/L — ABNORMAL LOW (ref 98–111)
Creatinine, Ser: 4.88 mg/dL — ABNORMAL HIGH (ref 0.61–1.24)
GFR, Estimated: 12 mL/min — ABNORMAL LOW (ref >60)
Glucose, Bld: 314 mg/dL — ABNORMAL HIGH (ref 70–99)
Phosphorus: 1 mg/dL — CL (ref 2.5–4.6)
Potassium: 4.6 mmol/L (ref 3.5–5.1)
Sodium: 131 mmol/L — ABNORMAL LOW (ref 135–145)

## 2021-02-04 LAB — CBC
HCT: 21.9 % — ABNORMAL LOW (ref 39.0–52.0)
Hemoglobin: 7.4 g/dL — ABNORMAL LOW (ref 13.0–17.0)
MCH: 31 pg (ref 26.0–34.0)
MCHC: 33.8 g/dL (ref 30.0–36.0)
MCV: 91.6 fL (ref 80.0–100.0)
Platelets: 312 10*3/uL (ref 150–400)
RBC: 2.39 MIL/uL — ABNORMAL LOW (ref 4.22–5.81)
RDW: 17.8 % — ABNORMAL HIGH (ref 11.5–15.5)
WBC: 26.9 10*3/uL — ABNORMAL HIGH (ref 4.0–10.5)
nRBC: 0 % (ref 0.0–0.2)

## 2021-02-04 NOTE — Progress Notes (Signed)
[facility]  ROUNDING NOTE   Subjective:  Patient due for hemodialysis treatment today. Sitting up in chair. Continues to have intermittent myoclonic facial twitching.   Objective:  Vital signs in last 24 hours:  Temperature 101.0 pulse 102 respirations 24 blood pressure 110/50  Physical Exam: General:  No acute distress  Head:  Normocephalic, atraumatic. 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  trace right lower extremity edema  Neurologic:  Resting comfortably, myoclonic facial twitching  Skin:  No acute rash  Access:  Right IJ temporary dialysis catheter    Basic Metabolic Panel: Recent Labs  Lab 01/31/21 0646 02/02/21 0438  NA 128* 131*  K 4.9 4.8  CL 90* 92*  CO2 21* 23  GLUCOSE 161* 194*  BUN 136* 124*  CREATININE 4.72* 4.42*  CALCIUM 9.7 9.9  PHOS 2.6 2.8     Liver Function Tests: Recent Labs  Lab 01/31/21 0646 02/02/21 0438  ALBUMIN 1.8* 1.9*    No results for input(s): LIPASE, AMYLASE in the last 168 hours. No results for input(s): AMMONIA in the last 168 hours.  CBC: Recent Labs  Lab 01/31/21 0646 02/02/21 0438 02/03/21 0413  WBC 19.9* 19.6*  --   HGB 7.2* 6.8* 9.6*  HCT 21.6* 21.6* 29.2*  MCV 90.8 96.0  --   PLT 341 330  --      Cardiac Enzymes: No results for input(s): CKTOTAL, CKMB, CKMBINDEX, TROPONINI in the last 168 hours.  BNP: Invalid input(s): POCBNP  CBG: No results for input(s): GLUCAP in the last 168 hours.  Microbiology: Results for orders placed or performed during the hospital encounter of 11/21/20  Culture, blood (routine x 2)     Status: None   Collection Time: 11/25/20  3:25 PM   Specimen: BLOOD RIGHT HAND  Result Value Ref Range Status   Specimen Description BLOOD RIGHT HAND  Final   Special Requests   Final     BOTTLES DRAWN AEROBIC ONLY Blood Culture results may not be optimal due to an inadequate volume of blood received in culture bottles   Culture   Final    NO GROWTH 5 DAYS Performed at [facility], [address], [place], [place] [address]    Report Status 11/30/2020 FINAL  Final  Culture, blood (routine x 2)     Status: None   Collection Time: 11/25/20  3:29 PM   Specimen: BLOOD LEFT HAND  Result Value Ref Range Status   Specimen Description BLOOD LEFT HAND  Final   Special Requests   Final    BOTTLES DRAWN AEROBIC AND ANAEROBIC Blood Culture adequate volume   Culture   Final    NO GROWTH 5 DAYS Performed at [facility], [address], [place], [place] [address]    Report Status 11/30/2020 FINAL  Final  Culture, Respiratory w Gram Stain     Status: None   Collection Time: 12/06/20 10:40 AM   Specimen: Tracheal Aspirate; Respiratory  Result Value Ref Range Status   Specimen Description TRACHEAL ASPIRATE  Final   Special Requests NONE  Final   Gram Stain   Final    ABUNDANT WBC PRESENT, PREDOMINANTLY PMN ABUNDANT GRAM NEGATIVE RODS FEW GRAM POSITIVE COCCI FEW GRAM POSITIVE RODS    Culture   Final  MODERATE PSEUDOMONAS AERUGINOSA Two isolates with different morphologies were identified as the same organism.The most resistant organism was reported. Performed at [facility], [address], [place], [place] [address]    Report Status 12/09/2020 FINAL  Final   Organism ID, Bacteria PSEUDOMONAS AERUGINOSA  Final      Susceptibility   Pseudomonas aeruginosa - MIC*    CEFTAZIDIME 16 INTERMEDIATE Intermediate     CIPROFLOXACIN 1 SENSITIVE Sensitive     GENTAMICIN 2 SENSITIVE Sensitive     IMIPENEM >=16 RESISTANT Resistant     * MODERATE PSEUDOMONAS AERUGINOSA  Culture, blood (routine x 2)     Status: None   Collection Time: 12/13/20  9:09 AM   Specimen: BLOOD LEFT HAND  Result Value Ref Range Status   Specimen Description BLOOD LEFT HAND  Final    Special Requests   Final    BOTTLES DRAWN AEROBIC AND ANAEROBIC Blood Culture results may not be optimal due to an inadequate volume of blood received in culture bottles   Culture   Final    NO GROWTH 5 DAYS Performed at [facility], [address], [place], [place] [address]    Report Status 12/18/2020 FINAL  Final  Culture, blood (routine x 2)     Status: Abnormal   Collection Time: 12/13/20  9:13 AM   Specimen: BLOOD  Result Value Ref Range Status   Specimen Description BLOOD RIGHT ANTECUBITAL  Final   Special Requests   Final    BOTTLES DRAWN AEROBIC AND ANAEROBIC Blood Culture adequate volume   Culture  Setup Time   Final    GRAM POSITIVE COCCI IN CLUSTERS AEROBIC BOTTLE ONLY CRITICAL RESULT CALLED TO, READ BACK BY AND VERIFIED WITH: RN C.ROBENSON AT 1234 ON 12/14/2020 BY T.SAAD.    Culture (A)  Final    STAPHYLOCOCCUS EPIDERMIDIS THE SIGNIFICANCE OF ISOLATING THIS ORGANISM FROM A SINGLE SET OF BLOOD CULTURES WHEN MULTIPLE SETS ARE DRAWN IS UNCERTAIN. PLEASE NOTIFY THE MICROBIOLOGY DEPARTMENT WITHIN ONE WEEK IF SPECIATION AND SENSITIVITIES ARE REQUIRED. Performed at [facility], [address], [place], [place] [address]    Report Status 12/16/2020 FINAL  Final  Blood Culture ID Panel (Reflexed)     Status: Abnormal   Collection Time: 12/13/20  9:13 AM  Result Value Ref Range Status   Enterococcus faecalis NOT DETECTED NOT DETECTED Final   Enterococcus Faecium NOT DETECTED NOT DETECTED Final   Listeria monocytogenes NOT DETECTED NOT DETECTED Final   Staphylococcus species DETECTED (A) NOT DETECTED Final    Comment: CRITICAL RESULT CALLED TO, READ BACK BY AND VERIFIED WITH: RN C.ROBERTSON AT 1234 ON 12/14/2020 BY T.SAAD.    Staphylococcus aureus (BCID) NOT DETECTED NOT DETECTED Final   Staphylococcus epidermidis DETECTED (A) NOT DETECTED Final    Comment: Methicillin (oxacillin) resistant coagulase negative staphylococcus. Possible blood culture contaminant  (unless isolated from more than one blood culture draw or clinical case suggests pathogenicity). No antibiotic treatment is indicated for blood  culture contaminants. CRITICAL RESULT CALLED TO, READ BACK BY AND VERIFIED WITH: RN C.ROBERTSON AT 1234 ON 12/14/2020 BY T.SAAD.    Staphylococcus lugdunensis NOT DETECTED NOT DETECTED Final   Streptococcus species NOT DETECTED NOT DETECTED Final   Streptococcus agalactiae NOT DETECTED NOT DETECTED Final   Streptococcus pneumoniae NOT DETECTED NOT DETECTED Final   Streptococcus pyogenes NOT DETECTED NOT DETECTED Final   A.calcoaceticus-baumannii NOT DETECTED NOT DETECTED Final   Bacteroides fragilis NOT DETECTED NOT DETECTED Final  Enterobacterales NOT DETECTED NOT DETECTED Final   Enterobacter cloacae complex NOT DETECTED NOT DETECTED Final   Escherichia coli NOT DETECTED NOT DETECTED Final   Klebsiella aerogenes NOT DETECTED NOT DETECTED Final   Klebsiella oxytoca NOT DETECTED NOT DETECTED Final   Klebsiella pneumoniae NOT DETECTED NOT DETECTED Final   Proteus species NOT DETECTED NOT DETECTED Final   Salmonella species NOT DETECTED NOT DETECTED Final   Serratia marcescens NOT DETECTED NOT DETECTED Final   Haemophilus influenzae NOT DETECTED NOT DETECTED Final   Neisseria meningitidis NOT DETECTED NOT DETECTED Final   Pseudomonas aeruginosa NOT DETECTED NOT DETECTED Final   Stenotrophomonas maltophilia NOT DETECTED NOT DETECTED Final   Candida albicans NOT DETECTED NOT DETECTED Final   Candida auris NOT DETECTED NOT DETECTED Final   Candida glabrata NOT DETECTED NOT DETECTED Final   Candida krusei NOT DETECTED NOT DETECTED Final   Candida parapsilosis NOT DETECTED NOT DETECTED Final   Candida tropicalis NOT DETECTED NOT DETECTED Final   Cryptococcus neoformans/gattii NOT DETECTED NOT DETECTED Final   Methicillin resistance mecA/C DETECTED (A) NOT DETECTED Final    Comment: CRITICAL RESULT CALLED TO, READ BACK BY AND VERIFIED WITH: RN  C.ROBERTSON AT 1234 ON 12/14/2020 BY T.SAAD. Performed at [facility], [address] [place], [place] [address]   Culture, blood (routine x 2)     Status: None   Collection Time: 12/17/20 11:31 AM   Specimen: BLOOD  Result Value Ref Range Status   Specimen Description BLOOD RIGHT ANTECUBITAL  Final   Special Requests   Final    BOTTLES DRAWN AEROBIC ONLY Blood Culture results may not be optimal due to an inadequate volume of blood received in culture bottles   Culture   Final    NO GROWTH 5 DAYS Performed at [facility], [address] [place], [place] [address]    Report Status 12/22/2020 FINAL  Final  Culture, blood (routine x 2)     Status: None   Collection Time: 12/17/20 11:38 AM   Specimen: BLOOD LEFT HAND  Result Value Ref Range Status   Specimen Description BLOOD LEFT HAND  Final   Special Requests   Final    BOTTLES DRAWN AEROBIC ONLY Blood Culture adequate volume   Culture   Final    NO GROWTH 5 DAYS Performed at [facility], [address] [place], [place] [address]    Report Status 12/22/2020 FINAL  Final  Culture, blood (routine x 2)     Status: None   Collection Time: 12/29/20  8:10 AM   Specimen: BLOOD RIGHT HAND  Result Value Ref Range Status   Specimen Description BLOOD RIGHT HAND  Final   Special Requests   Final    BOTTLES DRAWN AEROBIC AND ANAEROBIC Blood Culture results may not be optimal due to an inadequate volume of blood received in culture bottles   Culture   Final    NO GROWTH 5 DAYS Performed at [facility], [address] [place], [place] [address]    Report Status 01/03/2021 FINAL  Final  Culture, blood (routine x 2)     Status: None   Collection Time: 12/29/20  8:17 AM   Specimen: BLOOD RIGHT HAND  Result Value Ref Range Status   Specimen Description BLOOD RIGHT HAND  Final   Special Requests   Final    BOTTLES DRAWN AEROBIC ONLY Blood Culture results may not be optimal due to an inadequate volume of  blood  received in culture bottles   Culture   Final    NO GROWTH 5 DAYS Performed at [facility], [address], [place], [place] [address]    Report Status 01/03/2021 FINAL  Final  Culture, Respiratory w Gram Stain     Status: None   Collection Time: 01/01/21 11:54 AM   Specimen: Tracheal Aspirate; Respiratory  Result Value Ref Range Status   Specimen Description TRACHEAL ASPIRATE  Final   Special Requests NONE  Final   Gram Stain   Final    RARE WBC PRESENT,BOTH PMN AND MONONUCLEAR FEW GRAM NEGATIVE RODS Performed at [facility], [address] [place], [place] [address]    Culture MODERATE PSEUDOMONAS AERUGINOSA  Final   Report Status 01/03/2021 FINAL  Final   Organism ID, Bacteria PSEUDOMONAS AERUGINOSA  Final      Susceptibility   Pseudomonas aeruginosa - MIC*    CEFTAZIDIME 4 SENSITIVE Sensitive     CIPROFLOXACIN 1 SENSITIVE Sensitive     GENTAMICIN <=1 SENSITIVE Sensitive     IMIPENEM 2 SENSITIVE Sensitive     PIP/TAZO <=4 SENSITIVE Sensitive     CEFEPIME 2 SENSITIVE Sensitive     * MODERATE PSEUDOMONAS AERUGINOSA  Culture, blood (routine x 2)     Status: None   Collection Time: 01/04/21 11:02 AM   Specimen: BLOOD RIGHT HAND  Result Value Ref Range Status   Specimen Description BLOOD RIGHT HAND  Final   Special Requests   Final    BOTTLES DRAWN AEROBIC AND ANAEROBIC Blood Culture adequate volume   Culture   Final    NO GROWTH 5 DAYS Performed at [facility], [address] [place], [place] [address]    Report Status 01/09/2021 FINAL  Final  Culture, blood (routine x 2)     Status: Abnormal   Collection Time: 01/04/21 11:11 AM   Specimen: BLOOD RIGHT FOREARM  Result Value Ref Range Status   Specimen Description BLOOD RIGHT FOREARM  Final   Special Requests   Final    BOTTLES DRAWN AEROBIC AND ANAEROBIC Blood Culture results may not be optimal due to an inadequate volume of blood received in culture bottles   Culture  Setup Time   Final    GRAM  POSITIVE COCCI IN CLUSTERS ANAEROBIC BOTTLE ONLY CRITICAL RESULT CALLED TO, READ BACK BY AND VERIFIED WITH: RN D [place] [id number] AT 1003 BY CM    Culture (A)  Final    STAPHYLOCOCCUS EPIDERMIDIS THE SIGNIFICANCE OF ISOLATING THIS ORGANISM FROM A SINGLE SET OF BLOOD CULTURES WHEN MULTIPLE SETS ARE DRAWN IS UNCERTAIN. PLEASE NOTIFY THE MICROBIOLOGY DEPARTMENT WITHIN ONE WEEK IF SPECIATION AND SENSITIVITIES ARE REQUIRED. Performed at [facility], [address] [place], [place] [address]    Report Status 01/07/2021 FINAL  Final  Culture, blood (routine x 2)     Status: None   Collection Time: 01/09/21  5:00 PM   Specimen: BLOOD  Result Value Ref Range Status   Specimen Description BLOOD RIGHT ANTECUBITAL  Final   Special Requests   Final    BOTTLES DRAWN AEROBIC AND ANAEROBIC Blood Culture results may not be optimal due to an inadequate volume of blood received in culture bottles   Culture   Final    NO GROWTH 5 DAYS Performed at [facility], [address], [place], [place] [address]    Report Status 01/14/2021 FINAL  Final  Culture, blood (routine x 2)  Status: Abnormal   Collection Time: 01/09/21  5:04 PM   Specimen: BLOOD  Result Value Ref Range Status   Specimen Description BLOOD RIGHT ANTECUBITAL  Final   Special Requests   Final    BOTTLES DRAWN AEROBIC ONLY Blood Culture results may not be optimal due to an inadequate volume of blood received in culture bottles   Culture  Setup Time   Final    GRAM POSITIVE COCCI IN CLUSTERS AEROBIC BOTTLE ONLY CRITICAL VALUE NOTED.  VALUE IS CONSISTENT WITH PREVIOUSLY REPORTED AND CALLED VALUE.    Culture (A)  Final    STAPHYLOCOCCUS CAPITIS THE SIGNIFICANCE OF ISOLATING THIS ORGANISM FROM A SINGLE SET OF BLOOD CULTURES WHEN MULTIPLE SETS ARE DRAWN IS UNCERTAIN. PLEASE NOTIFY THE MICROBIOLOGY DEPARTMENT WITHIN ONE WEEK IF SPECIATION AND SENSITIVITIES ARE REQUIRED. Performed at [facility], [address],  [place], [place] [address]    Report Status 01/11/2021 FINAL  Final  Culture, Respiratory w Gram Stain     Status: None   Collection Time: 01/14/21  6:04 PM   Specimen: Tracheal Aspirate; Respiratory  Result Value Ref Range Status   Specimen Description TRACHEAL ASPIRATE  Final   Special Requests NONE  Final   Gram Stain   Final    RARE WBC PRESENT,BOTH PMN AND MONONUCLEAR FEW SQUAMOUS EPITHELIAL CELLS PRESENT FEW GRAM NEGATIVE RODS RARE GRAM POSITIVE COCCI Performed at [facility], [address], [place], [place] [address]    Culture MODERATE PSEUDOMONAS AERUGINOSA  Final   Report Status 01/18/2021 FINAL  Final   Organism ID, Bacteria PSEUDOMONAS AERUGINOSA  Final      Susceptibility   Pseudomonas aeruginosa - MIC*    CEFTAZIDIME 4 SENSITIVE Sensitive     CIPROFLOXACIN 2 INTERMEDIATE Intermediate     GENTAMICIN 4 SENSITIVE Sensitive     IMIPENEM >=16 RESISTANT Resistant     CEFEPIME 4 SENSITIVE Sensitive     * MODERATE PSEUDOMONAS AERUGINOSA  Culture, blood (routine x 2)     Status: None   Collection Time: 01/24/21  2:20 PM   Specimen: BLOOD LEFT HAND  Result Value Ref Range Status   Specimen Description BLOOD LEFT HAND  Final   Special Requests   Final    BOTTLES DRAWN AEROBIC AND ANAEROBIC Blood Culture adequate volume   Culture   Final    NO GROWTH 5 DAYS Performed at [facility], [address] [place], [place] [address]    Report Status 01/29/2021 FINAL  Final  Culture, blood (routine x 2)     Status: None   Collection Time: 01/24/21  2:24 PM   Specimen: BLOOD LEFT HAND  Result Value Ref Range Status   Specimen Description BLOOD LEFT HAND  Final   Special Requests   Final    BOTTLES DRAWN AEROBIC AND ANAEROBIC Blood Culture results may not be optimal due to an inadequate volume of blood received in culture bottles   Culture   Final    NO GROWTH 5 DAYS Performed at [facility], [address] [place], [place] [address]    Report Status  01/29/2021 FINAL  Final  Culture, Respiratory w Gram Stain     Status: None   Collection Time: 02/01/21 12:02 PM   Specimen: Tracheal Aspirate; Respiratory  Result Value Ref Range Status   Specimen Description TRACHEAL ASPIRATE  Final   Special Requests NONE  Final   Gram Stain   Final    NO WBC  SEEN FEW GRAM VARIABLE ROD Performed at [facility], [address], [place], [place] [address]    Culture   Final    ABUNDANT PSEUDOMONAS AERUGINOSA ABUNDANT KLEBSIELLA PNEUMONIAE Confirmed Extended Spectrum Beta-Lactamase Producer (ESBL).  In bloodstream infections from ESBL organisms, carbapenems are preferred over piperacillin/tazobactam. They are shown to have a lower risk of mortality.    Report Status 02/04/2021 FINAL  Final   Organism ID, Bacteria PSEUDOMONAS AERUGINOSA  Final   Organism ID, Bacteria KLEBSIELLA PNEUMONIAE  Final      Susceptibility   Klebsiella pneumoniae - MIC*    AMPICILLIN >=32 RESISTANT Resistant     CEFAZOLIN >=64 RESISTANT Resistant     CEFEPIME >=32 RESISTANT Resistant     CEFTAZIDIME >=64 RESISTANT Resistant     CEFTRIAXONE >=64 RESISTANT Resistant     CIPROFLOXACIN >=4 RESISTANT Resistant     GENTAMICIN >=16 RESISTANT Resistant     IMIPENEM <=0.25 SENSITIVE Sensitive     TRIMETH/SULFA >=320 RESISTANT Resistant     AMPICILLIN/SULBACTAM >=32 RESISTANT Resistant     PIP/TAZO >=128 RESISTANT Resistant     * ABUNDANT KLEBSIELLA PNEUMONIAE   Pseudomonas aeruginosa - MIC*    CEFTAZIDIME 4 SENSITIVE Sensitive     CIPROFLOXACIN 2 INTERMEDIATE Intermediate     GENTAMICIN 4 SENSITIVE Sensitive     IMIPENEM 2 SENSITIVE Sensitive     PIP/TAZO 16 SENSITIVE Sensitive     * ABUNDANT PSEUDOMONAS AERUGINOSA    Coagulation Studies: No results for input(s): LABPROT, INR in the last 72 hours.  Urinalysis: No results for input(s): COLORURINE, LABSPEC, PHURINE, GLUCOSEU, HGBUR, BILIRUBINUR, KETONESUR, PROTEINUR, UROBILINOGEN, NITRITE, LEUKOCYTESUR in the last  72 hours.  Invalid input(s): APPERANCEUR    Imaging: No results found.   Medications:       Assessment/ Plan:  65 y.o. male with a PMHx of ESRD on HD TTS, anemia of chronic kidney disease, hypertension, acute respiratory failure status post tracheostomy placement, seizure disorder, hyperlipidemia, diabetes mellitus type 2, history of hyperkalemia, history of coronary artery disease, peripheral vascular disease who was admitted to [facility] on 11/21/2020 for ongoing treatment of acute respiratory failure status post tracheostomy placement and ESRD.   1.  ESRD on HD.   Patient will be maintained on dialysis on MWF schedule.  2.  Acute respiratory failure.  Patient with stable respiratory pattern via tracheostomy.  3.  Anemia of chronic kidney disease.  Hemoglobin up to 9.6 posttransfusion.  Continue to monitor CBC.  4.  Secondary hyperparathyroidism.  Repeat serum phosphorus today.  5.  Fever/sepsis.  Continues to have Pseudomonas and Klebsiella in tracheal aspirate.  Continues to spike fevers intermittently.   LOS: 0 Joel Hebert 7/29/20229:20 AM

## 2021-02-06 LAB — BLOOD CULTURE ID PANEL (REFLEXED) - BCID2

## 2021-02-07 ENCOUNTER — Other Ambulatory Visit (HOSPITAL_COMMUNITY): Payer: Medicare Other

## 2021-02-07 LAB — CBC
HCT: 21.8 % — ABNORMAL LOW (ref 39.0–52.0)
Hemoglobin: 7.4 g/dL — ABNORMAL LOW (ref 13.0–17.0)
MCH: 30 pg (ref 26.0–34.0)
MCHC: 33.9 g/dL (ref 30.0–36.0)
MCV: 88.3 fL (ref 80.0–100.0)
Platelets: 342 10*3/uL (ref 150–400)
RBC: 2.47 MIL/uL — ABNORMAL LOW (ref 4.22–5.81)
RDW: 17.2 % — ABNORMAL HIGH (ref 11.5–15.5)
WBC: 21 10*3/uL — ABNORMAL HIGH (ref 4.0–10.5)
nRBC: 0.2 % (ref 0.0–0.2)

## 2021-02-07 LAB — RENAL FUNCTION PANEL
Albumin: 1.7 g/dL — ABNORMAL LOW (ref 3.5–5.0)
Anion gap: 18 — ABNORMAL HIGH (ref 5–15)
BUN: 164 mg/dL — ABNORMAL HIGH (ref 8–23)
CO2: 20 mmol/L — ABNORMAL LOW (ref 22–32)
Calcium: 9.9 mg/dL (ref 8.9–10.3)
Chloride: 92 mmol/L — ABNORMAL LOW (ref 98–111)
Creatinine, Ser: 5.03 mg/dL — ABNORMAL HIGH (ref 0.61–1.24)
GFR, Estimated: 12 mL/min — ABNORMAL LOW (ref >60)
Glucose, Bld: 250 mg/dL — ABNORMAL HIGH (ref 70–99)
Phosphorus: <1 mg/dL — CL (ref 2.5–4.6)
Potassium: 4.5 mmol/L (ref 3.5–5.1)
Sodium: 130 mmol/L — ABNORMAL LOW (ref 135–145)

## 2021-02-07 NOTE — Progress Notes (Signed)
[facility]  ROUNDING NOTE   Subjective:  No significant change in clinical status. Patient due for hemodialysis treatment today per usual schedule.   Objective:  Vital signs in last 24 hours:  Temperature 99 pulse 103 respirations 18 blood pressure 126/59  Physical Exam: General:  No acute distress  Head:  Normocephalic, atraumatic. 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  trace right lower extremity edema  Neurologic:  Resting comfortably, myoclonic facial twitching  Skin:  No acute rash  Access:  Right IJ temporary dialysis catheter    Basic Metabolic Panel: Recent Labs  Lab 02/02/21 0438 02/04/21 0500 02/07/21 0512  NA 131* 131* 130*  K 4.8 4.6 4.5  CL 92* 90* 92*  CO2 23 22 20*  GLUCOSE 194* 314* 250*  BUN 124* 139* 164*  CREATININE 4.42* 4.88* 5.03*  CALCIUM 9.9 9.7 9.9  PHOS 2.8 1.0* <1.0*     Liver Function Tests: Recent Labs  Lab 02/02/21 0438 02/04/21 0500 02/07/21 0512  ALBUMIN 1.9* 1.7* 1.7*    No results for input(s): LIPASE, AMYLASE in the last 168 hours. No results for input(s): AMMONIA in the last 168 hours.  CBC: Recent Labs  Lab 02/02/21 0438 02/03/21 0413 02/04/21 0500 02/07/21 0512  WBC 19.6*  --  26.9* 21.0*  HGB 6.8* 9.6* 7.4* 7.4*  HCT 21.6* 29.2* 21.9* 21.8*  MCV 96.0  --  91.6 88.3  PLT 330  --  312 342     Cardiac Enzymes: No results for input(s): CKTOTAL, CKMB, CKMBINDEX, TROPONINI in the last 168 hours.  BNP: Invalid input(s): POCBNP  CBG: No results for input(s): GLUCAP in the last 168 hours.  Microbiology: Results for orders placed or performed during the hospital encounter of 11/21/20  Culture, blood (routine x 2)     Status: None   Collection Time: 11/25/20  3:25 PM   Specimen: BLOOD RIGHT HAND  Result Value Ref  Range Status   Specimen Description BLOOD RIGHT HAND  Final   Special Requests   Final    BOTTLES DRAWN AEROBIC ONLY Blood Culture results may not be optimal due to an inadequate volume of blood received in culture bottles   Culture   Final    NO GROWTH 5 DAYS Performed at [facility], [address] [place], [place] [address]    Report Status 11/30/2020 FINAL  Final  Culture, blood (routine x 2)     Status: None   Collection Time: 11/25/20  3:29 PM   Specimen: BLOOD LEFT HAND  Result Value Ref Range Status   Specimen Description BLOOD LEFT HAND  Final   Special Requests   Final    BOTTLES DRAWN AEROBIC AND ANAEROBIC Blood Culture adequate volume   Culture   Final    NO GROWTH 5 DAYS Performed at [facility], [address], [place], [place] [address]    Report Status 11/30/2020 FINAL  Final  Culture, Respiratory w Gram Stain     Status: None   Collection Time: 12/06/20 10:40 AM   Specimen: Tracheal Aspirate; Respiratory  Result Value Ref Range Status   Specimen Description TRACHEAL ASPIRATE  Final   Special Requests NONE  Final   Gram Stain   Final    ABUNDANT WBC PRESENT, PREDOMINANTLY PMN  ABUNDANT GRAM NEGATIVE RODS FEW GRAM POSITIVE COCCI FEW GRAM POSITIVE RODS    Culture   Final    MODERATE PSEUDOMONAS AERUGINOSA Two isolates with different morphologies were identified as the same organism.The most resistant organism was reported. Performed at [facility], [address] [place], [place] [address]    Report Status 12/09/2020 FINAL  Final   Organism ID, Bacteria PSEUDOMONAS AERUGINOSA  Final      Susceptibility   Pseudomonas aeruginosa - MIC*    CEFTAZIDIME 16 INTERMEDIATE Intermediate     CIPROFLOXACIN 1 SENSITIVE Sensitive     GENTAMICIN 2 SENSITIVE Sensitive     IMIPENEM >=16 RESISTANT Resistant     * MODERATE PSEUDOMONAS AERUGINOSA  Culture, blood (routine x 2)     Status: None   Collection Time: 12/13/20  9:09 AM   Specimen: BLOOD LEFT  HAND  Result Value Ref Range Status   Specimen Description BLOOD LEFT HAND  Final   Special Requests   Final    BOTTLES DRAWN AEROBIC AND ANAEROBIC Blood Culture results may not be optimal due to an inadequate volume of blood received in culture bottles   Culture   Final    NO GROWTH 5 DAYS Performed at [facility], [address], [place], [place] [address]    Report Status 12/18/2020 FINAL  Final  Culture, blood (routine x 2)     Status: Abnormal   Collection Time: 12/13/20  9:13 AM   Specimen: BLOOD  Result Value Ref Range Status   Specimen Description BLOOD RIGHT ANTECUBITAL  Final   Special Requests   Final    BOTTLES DRAWN AEROBIC AND ANAEROBIC Blood Culture adequate volume   Culture  Setup Time   Final    GRAM POSITIVE COCCI IN CLUSTERS AEROBIC BOTTLE ONLY CRITICAL RESULT CALLED TO, READ BACK BY AND VERIFIED WITH: RN C.ROBENSON AT 1234 ON 12/14/2020 BY T.SAAD.    Culture (A)  Final    STAPHYLOCOCCUS EPIDERMIDIS THE SIGNIFICANCE OF ISOLATING THIS ORGANISM FROM A SINGLE SET OF BLOOD CULTURES WHEN MULTIPLE SETS ARE DRAWN IS UNCERTAIN. PLEASE NOTIFY THE MICROBIOLOGY DEPARTMENT WITHIN ONE WEEK IF SPECIATION AND SENSITIVITIES ARE REQUIRED. Performed at [facility], [address], [place], [place] [address]    Report Status 12/16/2020 FINAL  Final  Blood Culture ID Panel (Reflexed)     Status: Abnormal   Collection Time: 12/13/20  9:13 AM  Result Value Ref Range Status   Enterococcus faecalis NOT DETECTED NOT DETECTED Final   Enterococcus Faecium NOT DETECTED NOT DETECTED Final   Listeria monocytogenes NOT DETECTED NOT DETECTED Final   Staphylococcus species DETECTED (A) NOT DETECTED Final    Comment: CRITICAL RESULT CALLED TO, READ BACK BY AND VERIFIED WITH: RN C.ROBERTSON AT 1234 ON 12/14/2020 BY T.SAAD.    Staphylococcus aureus (BCID) NOT DETECTED NOT DETECTED Final   Staphylococcus epidermidis DETECTED (A) NOT DETECTED Final    Comment: Methicillin  (oxacillin) resistant coagulase negative staphylococcus. Possible blood culture contaminant (unless isolated from more than one blood culture draw or clinical case suggests pathogenicity). No antibiotic treatment is indicated for blood  culture contaminants. CRITICAL RESULT CALLED TO, READ BACK BY AND VERIFIED WITH: RN C.ROBERTSON AT 1234 ON 12/14/2020 BY T.SAAD.    Staphylococcus lugdunensis NOT DETECTED NOT DETECTED Final   Streptococcus species NOT DETECTED NOT DETECTED Final   Streptococcus agalactiae NOT DETECTED NOT DETECTED Final   Streptococcus pneumoniae NOT DETECTED NOT DETECTED Final   Streptococcus pyogenes NOT  DETECTED NOT DETECTED Final   A.calcoaceticus-baumannii NOT DETECTED NOT DETECTED Final   Bacteroides fragilis NOT DETECTED NOT DETECTED Final   Enterobacterales NOT DETECTED NOT DETECTED Final   Enterobacter cloacae complex NOT DETECTED NOT DETECTED Final   Escherichia coli NOT DETECTED NOT DETECTED Final   Klebsiella aerogenes NOT DETECTED NOT DETECTED Final   Klebsiella oxytoca NOT DETECTED NOT DETECTED Final   Klebsiella pneumoniae NOT DETECTED NOT DETECTED Final   Proteus species NOT DETECTED NOT DETECTED Final   Salmonella species NOT DETECTED NOT DETECTED Final   Serratia marcescens NOT DETECTED NOT DETECTED Final   Haemophilus influenzae NOT DETECTED NOT DETECTED Final   Neisseria meningitidis NOT DETECTED NOT DETECTED Final   Pseudomonas aeruginosa NOT DETECTED NOT DETECTED Final   Stenotrophomonas maltophilia NOT DETECTED NOT DETECTED Final   Candida albicans NOT DETECTED NOT DETECTED Final   Candida auris NOT DETECTED NOT DETECTED Final   Candida glabrata NOT DETECTED NOT DETECTED Final   Candida krusei NOT DETECTED NOT DETECTED Final   Candida parapsilosis NOT DETECTED NOT DETECTED Final   Candida tropicalis NOT DETECTED NOT DETECTED Final   Cryptococcus neoformans/gattii NOT DETECTED NOT DETECTED Final   Methicillin resistance mecA/C DETECTED (A) NOT  DETECTED Final    Comment: CRITICAL RESULT CALLED TO, READ BACK BY AND VERIFIED WITH: RN C.ROBERTSON AT 1234 ON 12/14/2020 BY T.SAAD. Performed at [facility], [address], [place], [place] [address]   Culture, blood (routine x 2)     Status: None   Collection Time: 12/17/20 11:31 AM   Specimen: BLOOD  Result Value Ref Range Status   Specimen Description BLOOD RIGHT ANTECUBITAL  Final   Special Requests   Final    BOTTLES DRAWN AEROBIC ONLY Blood Culture results may not be optimal due to an inadequate volume of blood received in culture bottles   Culture   Final    NO GROWTH 5 DAYS Performed at [facility], [address], [place], [place] [address]    Report Status 12/22/2020 FINAL  Final  Culture, blood (routine x 2)     Status: None   Collection Time: 12/17/20 11:38 AM   Specimen: BLOOD LEFT HAND  Result Value Ref Range Status   Specimen Description BLOOD LEFT HAND  Final   Special Requests   Final    BOTTLES DRAWN AEROBIC ONLY Blood Culture adequate volume   Culture   Final    NO GROWTH 5 DAYS Performed at [facility], [address], [place], [place] [address]    Report Status 12/22/2020 FINAL  Final  Culture, blood (routine x 2)     Status: None   Collection Time: 12/29/20  8:10 AM   Specimen: BLOOD RIGHT HAND  Result Value Ref Range Status   Specimen Description BLOOD RIGHT HAND  Final   Special Requests   Final    BOTTLES DRAWN AEROBIC AND ANAEROBIC Blood Culture results may not be optimal due to an inadequate volume of blood received in culture bottles   Culture   Final    NO GROWTH 5 DAYS Performed at [facility], [address], [place], [place] [address]    Report Status 01/03/2021 FINAL  Final  Culture, blood (routine x 2)     Status: None   Collection Time: 12/29/20  8:17 AM   Specimen: BLOOD RIGHT HAND  Result Value Ref Range Status   Specimen Description BLOOD RIGHT HAND  Final   Special Requests  Final    BOTTLES DRAWN  AEROBIC ONLY Blood Culture results may not be optimal due to an inadequate volume of blood received in culture bottles   Culture   Final    NO GROWTH 5 DAYS Performed at [facility], [address] [place], [place] [address]    Report Status 01/03/2021 FINAL  Final  Culture, Respiratory w Gram Stain     Status: None   Collection Time: 01/01/21 11:54 AM   Specimen: Tracheal Aspirate; Respiratory  Result Value Ref Range Status   Specimen Description TRACHEAL ASPIRATE  Final   Special Requests NONE  Final   Gram Stain   Final    RARE WBC PRESENT,BOTH PMN AND MONONUCLEAR FEW GRAM NEGATIVE RODS Performed at [facility], [address] [place], [place] [address]    Culture MODERATE PSEUDOMONAS AERUGINOSA  Final   Report Status 01/03/2021 FINAL  Final   Organism ID, Bacteria PSEUDOMONAS AERUGINOSA  Final      Susceptibility   Pseudomonas aeruginosa - MIC*    CEFTAZIDIME 4 SENSITIVE Sensitive     CIPROFLOXACIN 1 SENSITIVE Sensitive     GENTAMICIN <=1 SENSITIVE Sensitive     IMIPENEM 2 SENSITIVE Sensitive     PIP/TAZO <=4 SENSITIVE Sensitive     CEFEPIME 2 SENSITIVE Sensitive     * MODERATE PSEUDOMONAS AERUGINOSA  Culture, blood (routine x 2)     Status: None   Collection Time: 01/04/21 11:02 AM   Specimen: BLOOD RIGHT HAND  Result Value Ref Range Status   Specimen Description BLOOD RIGHT HAND  Final   Special Requests   Final    BOTTLES DRAWN AEROBIC AND ANAEROBIC Blood Culture adequate volume   Culture   Final    NO GROWTH 5 DAYS Performed at [facility], [address], [place], [place] [address]    Report Status 01/09/2021 FINAL  Final  Culture, blood (routine x 2)     Status: Abnormal   Collection Time: 01/04/21 11:11 AM   Specimen: BLOOD RIGHT FOREARM  Result Value Ref Range Status   Specimen Description BLOOD RIGHT FOREARM  Final   Special Requests   Final    BOTTLES DRAWN AEROBIC AND ANAEROBIC Blood Culture results may not be optimal due to an  inadequate volume of blood received in culture bottles   Culture  Setup Time   Final    GRAM POSITIVE COCCI IN CLUSTERS ANAEROBIC BOTTLE ONLY CRITICAL RESULT CALLED TO, READ BACK BY AND VERIFIED WITH: RN D [place] [id number] AT 1003 BY CM    Culture (A)  Final    STAPHYLOCOCCUS EPIDERMIDIS THE SIGNIFICANCE OF ISOLATING THIS ORGANISM FROM A SINGLE SET OF BLOOD CULTURES WHEN MULTIPLE SETS ARE DRAWN IS UNCERTAIN. PLEASE NOTIFY THE MICROBIOLOGY DEPARTMENT WITHIN ONE WEEK IF SPECIATION AND SENSITIVITIES ARE REQUIRED. Performed at [facility], [address] [place], [place] [address]    Report Status 01/07/2021 FINAL  Final  Culture, blood (routine x 2)     Status: None   Collection Time: 01/09/21  5:00 PM   Specimen: BLOOD  Result Value Ref Range Status   Specimen Description BLOOD RIGHT ANTECUBITAL  Final   Special Requests   Final    BOTTLES DRAWN AEROBIC AND ANAEROBIC Blood Culture results may not be optimal due to an inadequate volume of blood received in culture bottles   Culture   Final    NO GROWTH 5 DAYS Performed at [facility], [address] [place], [place] [address]    Report Status 01/14/2021 FINAL  Final  Culture, blood (routine x 2)     Status: Abnormal   Collection Time: 01/09/21  5:04 PM   Specimen: BLOOD  Result Value Ref Range Status   Specimen Description BLOOD RIGHT ANTECUBITAL  Final   Special Requests   Final    BOTTLES DRAWN AEROBIC ONLY Blood Culture results may not be optimal due to an inadequate volume of blood received in culture bottles   Culture  Setup Time   Final    GRAM POSITIVE COCCI IN CLUSTERS AEROBIC BOTTLE ONLY CRITICAL VALUE NOTED.  VALUE IS CONSISTENT WITH PREVIOUSLY REPORTED AND CALLED VALUE.    Culture (A)  Final    STAPHYLOCOCCUS CAPITIS THE SIGNIFICANCE OF ISOLATING THIS ORGANISM FROM A SINGLE SET OF BLOOD CULTURES WHEN MULTIPLE SETS ARE DRAWN IS UNCERTAIN. PLEASE NOTIFY THE MICROBIOLOGY DEPARTMENT WITHIN ONE WEEK IF  SPECIATION AND SENSITIVITIES ARE REQUIRED. Performed at [facility], [address], [place], [place] [address]    Report Status 01/11/2021 FINAL  Final  Culture, Respiratory w Gram Stain     Status: None   Collection Time: 01/14/21  6:04 PM   Specimen: Tracheal Aspirate; Respiratory  Result Value Ref Range Status   Specimen Description TRACHEAL ASPIRATE  Final   Special Requests NONE  Final   Gram Stain   Final    RARE WBC PRESENT,BOTH PMN AND MONONUCLEAR FEW SQUAMOUS EPITHELIAL CELLS PRESENT FEW GRAM NEGATIVE RODS RARE GRAM POSITIVE COCCI Performed at [facility], [address], [place], [place] [address]    Culture MODERATE PSEUDOMONAS AERUGINOSA  Final   Report Status 01/18/2021 FINAL  Final   Organism ID, Bacteria PSEUDOMONAS AERUGINOSA  Final      Susceptibility   Pseudomonas aeruginosa - MIC*    CEFTAZIDIME 4 SENSITIVE Sensitive     CIPROFLOXACIN 2 INTERMEDIATE Intermediate     GENTAMICIN 4 SENSITIVE Sensitive     IMIPENEM >=16 RESISTANT Resistant     CEFEPIME 4 SENSITIVE Sensitive     * MODERATE PSEUDOMONAS AERUGINOSA  Culture, blood (routine x 2)     Status: None   Collection Time: 01/24/21  2:20 PM   Specimen: BLOOD LEFT HAND  Result Value Ref Range Status   Specimen Description BLOOD LEFT HAND  Final   Special Requests   Final    BOTTLES DRAWN AEROBIC AND ANAEROBIC Blood Culture adequate volume   Culture   Final    NO GROWTH 5 DAYS Performed at [facility], [address], [place], [place] [address]    Report Status 01/29/2021 FINAL  Final  Culture, blood (routine x 2)     Status: None   Collection Time: 01/24/21  2:24 PM   Specimen: BLOOD LEFT HAND  Result Value Ref Range Status   Specimen Description BLOOD LEFT HAND  Final   Special Requests   Final    BOTTLES DRAWN AEROBIC AND ANAEROBIC Blood Culture results may not be optimal due to an inadequate volume of blood received in culture bottles   Culture   Final    NO GROWTH 5  DAYS Performed at [facility], [address], [place], [place] [address]    Report Status 01/29/2021 FINAL  Final  Culture, Respiratory w Gram Stain     Status: None   Collection Time: 02/01/21 12:02 PM   Specimen: Tracheal Aspirate; Respiratory  Result Value Ref Range Status  Specimen Description TRACHEAL ASPIRATE  Final   Special Requests NONE  Final   Gram Stain   Final    NO WBC SEEN FEW GRAM VARIABLE ROD Performed at [facility], [address] [place], [place] [id number]    Culture   Final    ABUNDANT PSEUDOMONAS AERUGINOSA ABUNDANT KLEBSIELLA PNEUMONIAE Confirmed Extended Spectrum Beta-Lactamase Producer (ESBL).  In bloodstream infections from ESBL organisms, carbapenems are preferred over piperacillin/tazobactam. They are shown to have a lower risk of mortality.    Report Status 02/04/2021 FINAL  Final   Organism ID, Bacteria PSEUDOMONAS AERUGINOSA  Final   Organism ID, Bacteria KLEBSIELLA PNEUMONIAE  Final      Susceptibility   Klebsiella pneumoniae - MIC*    AMPICILLIN >=32 RESISTANT Resistant     CEFAZOLIN >=64 RESISTANT Resistant     CEFEPIME >=32 RESISTANT Resistant     CEFTAZIDIME >=64 RESISTANT Resistant     CEFTRIAXONE >=64 RESISTANT Resistant     CIPROFLOXACIN >=4 RESISTANT Resistant     GENTAMICIN >=16 RESISTANT Resistant     IMIPENEM <=0.25 SENSITIVE Sensitive     TRIMETH/SULFA >=320 RESISTANT Resistant     AMPICILLIN/SULBACTAM >=32 RESISTANT Resistant     PIP/TAZO >=128 RESISTANT Resistant     * ABUNDANT KLEBSIELLA PNEUMONIAE   Pseudomonas aeruginosa - MIC*    CEFTAZIDIME 4 SENSITIVE Sensitive     CIPROFLOXACIN 2 INTERMEDIATE Intermediate     GENTAMICIN 4 SENSITIVE Sensitive     IMIPENEM 2 SENSITIVE Sensitive     PIP/TAZO 16 SENSITIVE Sensitive     * ABUNDANT PSEUDOMONAS AERUGINOSA  Culture, blood (routine x 2)     Status: Abnormal (Preliminary result)   Collection Time: 02/04/21  6:10 PM   Specimen: BLOOD RIGHT HAND  Result Value Ref  Range Status   Specimen Description BLOOD RIGHT HAND  Final   Special Requests   Final    BOTTLES DRAWN AEROBIC AND ANAEROBIC Blood Culture adequate volume   Culture  Setup Time (A)  Final    GRAM VARIABLE ROD IN BOTH AEROBIC AND ANAEROBIC BOTTLES CRITICAL RESULT CALLED TO, READ BACK BY AND VERIFIED WITH: T,ROBINSON RN @1507  02/06/21 EB Performed at [facility], [address] [place], [place] [address]    Culture GRAM VARIABLE ROD (A)  Final   Report Status PENDING  Incomplete  Blood Culture ID Panel (Reflexed)     Status: None   Collection Time: 02/04/21  6:10 PM  Result Value Ref Range Status   Enterococcus faecalis NOT DETECTED NOT DETECTED Final   Enterococcus Faecium NOT DETECTED NOT DETECTED Final   Listeria monocytogenes NOT DETECTED NOT DETECTED Final   Staphylococcus species NOT DETECTED NOT DETECTED Final   Staphylococcus aureus (BCID) NOT DETECTED NOT DETECTED Final   Staphylococcus epidermidis NOT DETECTED NOT DETECTED Final   Staphylococcus lugdunensis NOT DETECTED NOT DETECTED Final   Streptococcus species NOT DETECTED NOT DETECTED Final   Streptococcus agalactiae NOT DETECTED NOT DETECTED Final   Streptococcus pneumoniae NOT DETECTED NOT DETECTED Final   Streptococcus pyogenes NOT DETECTED NOT DETECTED Final   A.calcoaceticus-baumannii NOT DETECTED NOT DETECTED Final   Bacteroides fragilis NOT DETECTED NOT DETECTED Final   Enterobacterales NOT DETECTED NOT DETECTED Final   Enterobacter cloacae complex NOT DETECTED NOT DETECTED Final   Escherichia coli NOT DETECTED NOT DETECTED Final   Klebsiella aerogenes NOT DETECTED NOT DETECTED Final   Klebsiella oxytoca NOT DETECTED NOT DETECTED Final   Klebsiella pneumoniae NOT DETECTED NOT DETECTED Final  Proteus species NOT DETECTED NOT DETECTED Final   Salmonella species NOT DETECTED NOT DETECTED Final   Serratia marcescens NOT DETECTED NOT DETECTED Final   Haemophilus influenzae NOT DETECTED NOT DETECTED Final    Neisseria meningitidis NOT DETECTED NOT DETECTED Final   Pseudomonas aeruginosa NOT DETECTED NOT DETECTED Final   Stenotrophomonas maltophilia NOT DETECTED NOT DETECTED Final   Candida albicans NOT DETECTED NOT DETECTED Final   Candida auris NOT DETECTED NOT DETECTED Final   Candida glabrata NOT DETECTED NOT DETECTED Final   Candida krusei NOT DETECTED NOT DETECTED Final   Candida parapsilosis NOT DETECTED NOT DETECTED Final   Candida tropicalis NOT DETECTED NOT DETECTED Final   Cryptococcus neoformans/gattii NOT DETECTED NOT DETECTED Final    Comment: Performed at [facility], [address] [place], [place] [address]  Culture, blood (routine x 2)     Status: Abnormal (Preliminary result)   Collection Time: 02/04/21  6:25 PM   Specimen: BLOOD  Result Value Ref Range Status   Specimen Description BLOOD RIGHT ANTECUBITAL  Final   Special Requests   Final    BOTTLES DRAWN AEROBIC AND ANAEROBIC Blood Culture adequate volume   Culture  Setup Time (A)  Final    GRAM VARIABLE ROD IN BOTH AEROBIC AND ANAEROBIC BOTTLES Performed at [facility], [address], [place], [place] [address]    Culture GRAM VARIABLE ROD (A)  Final   Report Status PENDING  Incomplete    Coagulation Studies: No results for input(s): LABPROT, INR in the last 72 hours.  Urinalysis: No results for input(s): COLORURINE, LABSPEC, PHURINE, GLUCOSEU, HGBUR, BILIRUBINUR, KETONESUR, PROTEINUR, UROBILINOGEN, NITRITE, LEUKOCYTESUR in the last 72 hours.  Invalid input(s): APPERANCEUR    Imaging: DG CHEST PORT 1 VIEW  Result Date: 02/07/2021 CLINICAL DATA:  65 year old male with history of respiratory failure. Pneumonia. EXAM: PORTABLE CHEST 1 VIEW COMPARISON:  Chest x-ray 02/01/2021. FINDINGS: A tracheostomy tube is in place with tip 7.5 cm above the carina. Right IJ Vas-Cath with tip terminating at the superior cavoatrial junction. Diffuse peribronchial cuffing. Lung volumes are low. No consolidative  airspace disease. No pleural effusions. No pneumothorax. No pulmonary nodule or mass noted. Pulmonary vasculature and the cardiomediastinal silhouette are within normal limits. Atherosclerotic calcifications in the thoracic aorta. IMPRESSION: 1. Support apparatus, as above. 2. Diffuse peribronchial cuffing concerning for acute bronchitis. 3. Aortic atherosclerosis. Electronically Signed   By: Trudie Reed M.D.   On: 02/07/2021 06:34     Medications:       Assessment/ Plan:  65 y.o. male with a PMHx of ESRD on HD TTS, anemia of chronic kidney disease, hypertension, acute respiratory failure status post tracheostomy placement, seizure disorder, hyperlipidemia, diabetes mellitus type 2, history of hyperkalemia, history of coronary artery disease, peripheral vascular disease who was admitted to [facility] on 11/21/2020 for ongoing treatment of acute respiratory failure status post tracheostomy placement and ESRD.   1.  ESRD on HD.   Patient seen and evaluated at bedside.  He will be due for hemodialysis treatment today.  2.  Acute respiratory failure.  Patient continues to breathe comfortably through his tracheostomy on her T-piece.  3.  Anemia of chronic kidney disease.  Hemoglobin down to 7.4.  Had blood transfusion last week.  Continue to monitor CBC closely.  4.  Secondary hyperparathyroidism.  Phosphorus less than 1.0.  Administer sodium phosphorus 30 mmol IV x1.  5.  Fever/sepsis.  Blood cultures now positive for gram variable rods.  Previously had Pseudomonas and Klebsiella in the tracheal aspirate.  Would recommend infectious disease becoming involved again.   LOS: 0 Adreena Willits 8/1/20228:06 AM

## 2021-02-08 LAB — CULTURE, BLOOD (ROUTINE X 2)
Special Requests: ADEQUATE
Special Requests: ADEQUATE

## 2021-02-09 LAB — RENAL FUNCTION PANEL
Albumin: 1.6 g/dL — ABNORMAL LOW (ref 3.5–5.0)
Anion gap: 18 — ABNORMAL HIGH (ref 5–15)
BUN: 101 mg/dL — ABNORMAL HIGH (ref 8–23)
CO2: 23 mmol/L (ref 22–32)
Calcium: 9.6 mg/dL (ref 8.9–10.3)
Chloride: 93 mmol/L — ABNORMAL LOW (ref 98–111)
Creatinine, Ser: 4.05 mg/dL — ABNORMAL HIGH (ref 0.61–1.24)
GFR, Estimated: 15 mL/min — ABNORMAL LOW (ref >60)
Glucose, Bld: 247 mg/dL — ABNORMAL HIGH (ref 70–99)
Phosphorus: 1 mg/dL — CL (ref 2.5–4.6)
Potassium: 4.4 mmol/L (ref 3.5–5.1)
Sodium: 134 mmol/L — ABNORMAL LOW (ref 135–145)

## 2021-02-09 NOTE — Progress Notes (Signed)
[facility]  ROUNDING NOTE   Subjective:  Tracheostomy now capped. Patient due for hemodialysis treatment today.   Objective:  Vital signs in last 24 hours:  Temperature nine 9.3 pulse 92 respirations 20 blood pressure 127/61  Physical Exam: General:  No acute distress  Head:  Normocephalic, atraumatic. 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  trace right lower extremity edema  Neurologic:  Resting comfortably, myoclonic facial twitching  Skin:  No acute rash  Access:  Right IJ temporary dialysis catheter    Basic Metabolic Panel: Recent Labs  Lab 02/04/21 0500 02/07/21 0512 02/09/21 0441  NA 131* 130* 134*  K 4.6 4.5 4.4  CL 90* 92* 93*  CO2 22 20* 23  GLUCOSE 314* 250* 247*  BUN 139* 164* 101*  CREATININE 4.88* 5.03* 4.05*  CALCIUM 9.7 9.9 9.6  PHOS 1.0* <1.0* 1.0*     Liver Function Tests: Recent Labs  Lab 02/04/21 0500 02/07/21 0512 02/09/21 0441  ALBUMIN 1.7* 1.7* 1.6*    No results for input(s): LIPASE, AMYLASE in the last 168 hours. No results for input(s): AMMONIA in the last 168 hours.  CBC: Recent Labs  Lab 02/03/21 0413 02/04/21 0500 02/07/21 0512  WBC  --  26.9* 21.0*  HGB 9.6* 7.4* 7.4*  HCT 29.2* 21.9* 21.8*  MCV  --  91.6 88.3  PLT  --  312 342     Cardiac Enzymes: No results for input(s): CKTOTAL, CKMB, CKMBINDEX, TROPONINI in the last 168 hours.  BNP: Invalid input(s): POCBNP  CBG: No results for input(s): GLUCAP in the last 168 hours.  Microbiology: Results for orders placed or performed during the hospital encounter of 11/21/20  Culture, blood (routine x 2)     Status: None   Collection Time: 11/25/20  3:25 PM   Specimen: BLOOD RIGHT HAND  Result Value Ref Range Status   Specimen Description BLOOD RIGHT HAND  Final   Special  Requests   Final    BOTTLES DRAWN AEROBIC ONLY Blood Culture results may not be optimal due to an inadequate volume of blood received in culture bottles   Culture   Final    NO GROWTH 5 DAYS Performed at [facility], [address], [place], [place] [address]    Report Status 11/30/2020 FINAL  Final  Culture, blood (routine x 2)     Status: None   Collection Time: 11/25/20  3:29 PM   Specimen: BLOOD LEFT HAND  Result Value Ref Range Status   Specimen Description BLOOD LEFT HAND  Final   Special Requests   Final    BOTTLES DRAWN AEROBIC AND ANAEROBIC Blood Culture adequate volume   Culture   Final    NO GROWTH 5 DAYS Performed at [facility], [address] [place], [place] [address]    Report Status 11/30/2020 FINAL  Final  Culture, Respiratory w Gram Stain     Status: None   Collection Time: 12/06/20 10:40 AM   Specimen: Tracheal Aspirate; Respiratory  Result Value Ref Range Status   Specimen Description TRACHEAL ASPIRATE  Final   Special Requests NONE  Final   Gram Stain   Final    ABUNDANT WBC PRESENT, PREDOMINANTLY PMN ABUNDANT GRAM NEGATIVE RODS FEW GRAM POSITIVE COCCI FEW GRAM POSITIVE RODS  Culture   Final    MODERATE PSEUDOMONAS AERUGINOSA Two isolates with different morphologies were identified as the same organism.The most resistant organism was reported. Performed at [facility], [address] [place], [place] [address]    Report Status 12/09/2020 FINAL  Final   Organism ID, Bacteria PSEUDOMONAS AERUGINOSA  Final      Susceptibility   Pseudomonas aeruginosa - MIC*    CEFTAZIDIME 16 INTERMEDIATE Intermediate     CIPROFLOXACIN 1 SENSITIVE Sensitive     GENTAMICIN 2 SENSITIVE Sensitive     IMIPENEM >=16 RESISTANT Resistant     * MODERATE PSEUDOMONAS AERUGINOSA  Culture, blood (routine x 2)     Status: None   Collection Time: 12/13/20  9:09 AM   Specimen: BLOOD LEFT HAND  Result Value Ref Range Status   Specimen Description BLOOD LEFT  HAND  Final   Special Requests   Final    BOTTLES DRAWN AEROBIC AND ANAEROBIC Blood Culture results may not be optimal due to an inadequate volume of blood received in culture bottles   Culture   Final    NO GROWTH 5 DAYS Performed at [facility], [address], [place], [place] [address]    Report Status 12/18/2020 FINAL  Final  Culture, blood (routine x 2)     Status: Abnormal   Collection Time: 12/13/20  9:13 AM   Specimen: BLOOD  Result Value Ref Range Status   Specimen Description BLOOD RIGHT ANTECUBITAL  Final   Special Requests   Final    BOTTLES DRAWN AEROBIC AND ANAEROBIC Blood Culture adequate volume   Culture  Setup Time   Final    GRAM POSITIVE COCCI IN CLUSTERS AEROBIC BOTTLE ONLY CRITICAL RESULT CALLED TO, READ BACK BY AND VERIFIED WITH: RN C.ROBENSON AT 1234 ON 12/14/2020 BY T.SAAD.    Culture (A)  Final    STAPHYLOCOCCUS EPIDERMIDIS THE SIGNIFICANCE OF ISOLATING THIS ORGANISM FROM A SINGLE SET OF BLOOD CULTURES WHEN MULTIPLE SETS ARE DRAWN IS UNCERTAIN. PLEASE NOTIFY THE MICROBIOLOGY DEPARTMENT WITHIN ONE WEEK IF SPECIATION AND SENSITIVITIES ARE REQUIRED. Performed at [facility], [address], [place], [place] [address]    Report Status 12/16/2020 FINAL  Final  Blood Culture ID Panel (Reflexed)     Status: Abnormal   Collection Time: 12/13/20  9:13 AM  Result Value Ref Range Status   Enterococcus faecalis NOT DETECTED NOT DETECTED Final   Enterococcus Faecium NOT DETECTED NOT DETECTED Final   Listeria monocytogenes NOT DETECTED NOT DETECTED Final   Staphylococcus species DETECTED (A) NOT DETECTED Final    Comment: CRITICAL RESULT CALLED TO, READ BACK BY AND VERIFIED WITH: RN C.ROBERTSON AT 1234 ON 12/14/2020 BY T.SAAD.    Staphylococcus aureus (BCID) NOT DETECTED NOT DETECTED Final   Staphylococcus epidermidis DETECTED (A) NOT DETECTED Final    Comment: Methicillin (oxacillin) resistant coagulase negative staphylococcus. Possible blood culture  contaminant (unless isolated from more than one blood culture draw or clinical case suggests pathogenicity). No antibiotic treatment is indicated for blood  culture contaminants. CRITICAL RESULT CALLED TO, READ BACK BY AND VERIFIED WITH: RN C.ROBERTSON AT 1234 ON 12/14/2020 BY T.SAAD.    Staphylococcus lugdunensis NOT DETECTED NOT DETECTED Final   Streptococcus species NOT DETECTED NOT DETECTED Final   Streptococcus agalactiae NOT DETECTED NOT DETECTED Final   Streptococcus pneumoniae NOT DETECTED NOT DETECTED Final   Streptococcus pyogenes NOT DETECTED NOT DETECTED Final   A.calcoaceticus-baumannii NOT DETECTED NOT DETECTED Final   Bacteroides  fragilis NOT DETECTED NOT DETECTED Final   Enterobacterales NOT DETECTED NOT DETECTED Final   Enterobacter cloacae complex NOT DETECTED NOT DETECTED Final   Escherichia coli NOT DETECTED NOT DETECTED Final   Klebsiella aerogenes NOT DETECTED NOT DETECTED Final   Klebsiella oxytoca NOT DETECTED NOT DETECTED Final   Klebsiella pneumoniae NOT DETECTED NOT DETECTED Final   Proteus species NOT DETECTED NOT DETECTED Final   Salmonella species NOT DETECTED NOT DETECTED Final   Serratia marcescens NOT DETECTED NOT DETECTED Final   Haemophilus influenzae NOT DETECTED NOT DETECTED Final   Neisseria meningitidis NOT DETECTED NOT DETECTED Final   Pseudomonas aeruginosa NOT DETECTED NOT DETECTED Final   Stenotrophomonas maltophilia NOT DETECTED NOT DETECTED Final   Candida albicans NOT DETECTED NOT DETECTED Final   Candida auris NOT DETECTED NOT DETECTED Final   Candida glabrata NOT DETECTED NOT DETECTED Final   Candida krusei NOT DETECTED NOT DETECTED Final   Candida parapsilosis NOT DETECTED NOT DETECTED Final   Candida tropicalis NOT DETECTED NOT DETECTED Final   Cryptococcus neoformans/gattii NOT DETECTED NOT DETECTED Final   Methicillin resistance mecA/C DETECTED (A) NOT DETECTED Final    Comment: CRITICAL RESULT CALLED TO, READ BACK BY AND VERIFIED  WITH: RN C.ROBERTSON AT 1234 ON 12/14/2020 BY T.SAAD. Performed at [facility], [address], [place], [place] [address]   Culture, blood (routine x 2)     Status: None   Collection Time: 12/17/20 11:31 AM   Specimen: BLOOD  Result Value Ref Range Status   Specimen Description BLOOD RIGHT ANTECUBITAL  Final   Special Requests   Final    BOTTLES DRAWN AEROBIC ONLY Blood Culture results may not be optimal due to an inadequate volume of blood received in culture bottles   Culture   Final    NO GROWTH 5 DAYS Performed at [facility], [address], [place], [place] [address]    Report Status 12/22/2020 FINAL  Final  Culture, blood (routine x 2)     Status: None   Collection Time: 12/17/20 11:38 AM   Specimen: BLOOD LEFT HAND  Result Value Ref Range Status   Specimen Description BLOOD LEFT HAND  Final   Special Requests   Final    BOTTLES DRAWN AEROBIC ONLY Blood Culture adequate volume   Culture   Final    NO GROWTH 5 DAYS Performed at [facility], [address] [place], [place] [address]    Report Status 12/22/2020 FINAL  Final  Culture, blood (routine x 2)     Status: None   Collection Time: 12/29/20  8:10 AM   Specimen: BLOOD RIGHT HAND  Result Value Ref Range Status   Specimen Description BLOOD RIGHT HAND  Final   Special Requests   Final    BOTTLES DRAWN AEROBIC AND ANAEROBIC Blood Culture results may not be optimal due to an inadequate volume of blood received in culture bottles   Culture   Final    NO GROWTH 5 DAYS Performed at [facility], [address], [place], [place] [address]    Report Status 01/03/2021 FINAL  Final  Culture, blood (routine x 2)     Status: None   Collection Time: 12/29/20  8:17 AM   Specimen: BLOOD RIGHT HAND  Result Value Ref Range Status   Specimen Description BLOOD RIGHT HAND  Final   Special Requests   Final    BOTTLES DRAWN AEROBIC ONLY Blood Culture results may not  be optimal due to an inadequate volume  of blood received in culture bottles   Culture   Final    NO GROWTH 5 DAYS Performed at [facility], [address] [place], [place] [address]    Report Status 01/03/2021 FINAL  Final  Culture, Respiratory w Gram Stain     Status: None   Collection Time: 01/01/21 11:54 AM   Specimen: Tracheal Aspirate; Respiratory  Result Value Ref Range Status   Specimen Description TRACHEAL ASPIRATE  Final   Special Requests NONE  Final   Gram Stain   Final    RARE WBC PRESENT,BOTH PMN AND MONONUCLEAR FEW GRAM NEGATIVE RODS Performed at [facility], [address] [place], [place] [address]    Culture MODERATE PSEUDOMONAS AERUGINOSA  Final   Report Status 01/03/2021 FINAL  Final   Organism ID, Bacteria PSEUDOMONAS AERUGINOSA  Final      Susceptibility   Pseudomonas aeruginosa - MIC*    CEFTAZIDIME 4 SENSITIVE Sensitive     CIPROFLOXACIN 1 SENSITIVE Sensitive     GENTAMICIN <=1 SENSITIVE Sensitive     IMIPENEM 2 SENSITIVE Sensitive     PIP/TAZO <=4 SENSITIVE Sensitive     CEFEPIME 2 SENSITIVE Sensitive     * MODERATE PSEUDOMONAS AERUGINOSA  Culture, blood (routine x 2)     Status: None   Collection Time: 01/04/21 11:02 AM   Specimen: BLOOD RIGHT HAND  Result Value Ref Range Status   Specimen Description BLOOD RIGHT HAND  Final   Special Requests   Final    BOTTLES DRAWN AEROBIC AND ANAEROBIC Blood Culture adequate volume   Culture   Final    NO GROWTH 5 DAYS Performed at [facility], [address], [place], [place] [address]    Report Status 01/09/2021 FINAL  Final  Culture, blood (routine x 2)     Status: Abnormal   Collection Time: 01/04/21 11:11 AM   Specimen: BLOOD RIGHT FOREARM  Result Value Ref Range Status   Specimen Description BLOOD RIGHT FOREARM  Final   Special Requests   Final    BOTTLES DRAWN AEROBIC AND ANAEROBIC Blood Culture results may not be optimal due to an inadequate volume of blood received in culture bottles   Culture  Setup Time   Final     GRAM POSITIVE COCCI IN CLUSTERS ANAEROBIC BOTTLE ONLY CRITICAL RESULT CALLED TO, READ BACK BY AND VERIFIED WITH: RN D [place] [id number] AT 1003 BY CM    Culture (A)  Final    STAPHYLOCOCCUS EPIDERMIDIS THE SIGNIFICANCE OF ISOLATING THIS ORGANISM FROM A SINGLE SET OF BLOOD CULTURES WHEN MULTIPLE SETS ARE DRAWN IS UNCERTAIN. PLEASE NOTIFY THE MICROBIOLOGY DEPARTMENT WITHIN ONE WEEK IF SPECIATION AND SENSITIVITIES ARE REQUIRED. Performed at [facility], [address], [place], [place] [address]    Report Status 01/07/2021 FINAL  Final  Culture, blood (routine x 2)     Status: None   Collection Time: 01/09/21  5:00 PM   Specimen: BLOOD  Result Value Ref Range Status   Specimen Description BLOOD RIGHT ANTECUBITAL  Final   Special Requests   Final    BOTTLES DRAWN AEROBIC AND ANAEROBIC Blood Culture results may not be optimal due to an inadequate volume of blood received in culture bottles   Culture   Final    NO GROWTH 5 DAYS Performed at [facility], [address], [place], [place] [address]    Report Status 01/14/2021 FINAL  Final  Culture, blood (routine x 2)     Status: Abnormal   Collection Time: 01/09/21  5:04 PM   Specimen: BLOOD  Result Value Ref Range Status   Specimen Description BLOOD RIGHT ANTECUBITAL  Final   Special Requests   Final    BOTTLES DRAWN AEROBIC ONLY Blood Culture results may not be optimal due to an inadequate volume of blood received in culture bottles   Culture  Setup Time   Final    GRAM POSITIVE COCCI IN CLUSTERS AEROBIC BOTTLE ONLY CRITICAL VALUE NOTED.  VALUE IS CONSISTENT WITH PREVIOUSLY REPORTED AND CALLED VALUE.    Culture (A)  Final    STAPHYLOCOCCUS CAPITIS THE SIGNIFICANCE OF ISOLATING THIS ORGANISM FROM A SINGLE SET OF BLOOD CULTURES WHEN MULTIPLE SETS ARE DRAWN IS UNCERTAIN. PLEASE NOTIFY THE MICROBIOLOGY DEPARTMENT WITHIN ONE WEEK IF SPECIATION AND SENSITIVITIES ARE REQUIRED. Performed at [facility], [address] [place], [place] [address]    Report Status 01/11/2021 FINAL  Final  Culture, Respiratory w Gram Stain     Status: None   Collection Time: 01/14/21  6:04 PM   Specimen: Tracheal Aspirate; Respiratory  Result Value Ref Range Status   Specimen Description TRACHEAL ASPIRATE  Final   Special Requests NONE  Final   Gram Stain   Final    RARE WBC PRESENT,BOTH PMN AND MONONUCLEAR FEW SQUAMOUS EPITHELIAL CELLS PRESENT FEW GRAM NEGATIVE RODS RARE GRAM POSITIVE COCCI Performed at [facility], [address] [place], [place] [address]    Culture MODERATE PSEUDOMONAS AERUGINOSA  Final   Report Status 01/18/2021 FINAL  Final   Organism ID, Bacteria PSEUDOMONAS AERUGINOSA  Final      Susceptibility   Pseudomonas aeruginosa - MIC*    CEFTAZIDIME 4 SENSITIVE Sensitive     CIPROFLOXACIN 2 INTERMEDIATE Intermediate     GENTAMICIN 4 SENSITIVE Sensitive     IMIPENEM >=16 RESISTANT Resistant     CEFEPIME 4 SENSITIVE Sensitive     * MODERATE PSEUDOMONAS AERUGINOSA  Culture, blood (routine x 2)     Status: None   Collection Time: 01/24/21  2:20 PM   Specimen: BLOOD LEFT HAND  Result Value Ref Range Status   Specimen Description BLOOD LEFT HAND  Final   Special Requests   Final    BOTTLES DRAWN AEROBIC AND ANAEROBIC Blood Culture adequate volume   Culture   Final    NO GROWTH 5 DAYS Performed at [facility], [address], [place], [place] [address]    Report Status 01/29/2021 FINAL  Final  Culture, blood (routine x 2)     Status: None   Collection Time: 01/24/21  2:24 PM   Specimen: BLOOD LEFT HAND  Result Value Ref Range Status   Specimen Description BLOOD LEFT HAND  Final   Special Requests   Final    BOTTLES DRAWN AEROBIC AND ANAEROBIC Blood Culture results may not be optimal due to an inadequate volume of blood received in culture bottles   Culture   Final    NO GROWTH 5 DAYS Performed at [facility], [address], [place], [place] [address]    Report Status  01/29/2021 FINAL  Final  Culture, Respiratory w Gram Stain     Status: None   Collection Time: 02/01/21 12:02 PM   Specimen: Tracheal Aspirate; Respiratory  Result Value Ref Range Status   Specimen Description TRACHEAL ASPIRATE  Final   Special Requests NONE  Final  Gram Stain   Final    NO WBC SEEN FEW GRAM VARIABLE ROD Performed at [facility], [address] [place], [place] [address]    Culture   Final    ABUNDANT PSEUDOMONAS AERUGINOSA ABUNDANT KLEBSIELLA PNEUMONIAE Confirmed Extended Spectrum Beta-Lactamase Producer (ESBL).  In bloodstream infections from ESBL organisms, carbapenems are preferred over piperacillin/tazobactam. They are shown to have a lower risk of mortality.    Report Status 02/04/2021 FINAL  Final   Organism ID, Bacteria PSEUDOMONAS AERUGINOSA  Final   Organism ID, Bacteria KLEBSIELLA PNEUMONIAE  Final      Susceptibility   Klebsiella pneumoniae - MIC*    AMPICILLIN >=32 RESISTANT Resistant     CEFAZOLIN >=64 RESISTANT Resistant     CEFEPIME >=32 RESISTANT Resistant     CEFTAZIDIME >=64 RESISTANT Resistant     CEFTRIAXONE >=64 RESISTANT Resistant     CIPROFLOXACIN >=4 RESISTANT Resistant     GENTAMICIN >=16 RESISTANT Resistant     IMIPENEM <=0.25 SENSITIVE Sensitive     TRIMETH/SULFA >=320 RESISTANT Resistant     AMPICILLIN/SULBACTAM >=32 RESISTANT Resistant     PIP/TAZO >=128 RESISTANT Resistant     * ABUNDANT KLEBSIELLA PNEUMONIAE   Pseudomonas aeruginosa - MIC*    CEFTAZIDIME 4 SENSITIVE Sensitive     CIPROFLOXACIN 2 INTERMEDIATE Intermediate     GENTAMICIN 4 SENSITIVE Sensitive     IMIPENEM 2 SENSITIVE Sensitive     PIP/TAZO 16 SENSITIVE Sensitive     * ABUNDANT PSEUDOMONAS AERUGINOSA  Culture, blood (routine x 2)     Status: Abnormal   Collection Time: 02/04/21  6:10 PM   Specimen: BLOOD RIGHT HAND  Result Value Ref Range Status   Specimen Description BLOOD RIGHT HAND  Final   Special Requests   Final    BOTTLES DRAWN AEROBIC AND  ANAEROBIC Blood Culture adequate volume   Culture  Setup Time (A)  Final    GRAM VARIABLE ROD IN BOTH AEROBIC AND ANAEROBIC BOTTLES CRITICAL RESULT CALLED TO, READ BACK BY AND VERIFIED WITH: T,ROBINSON RN @1507  02/06/21 EB    Culture (A)  Final    LACTOBACILLUS CASEI Standardized susceptibility testing for this organism is not available. Performed at [facility], [address] [place], [place]    Report Status 02/08/2021 FINAL  Final  Blood Culture ID Panel (Reflexed)     Status: None   Collection Time: 02/04/21  6:10 PM  Result Value Ref Range Status   Enterococcus faecalis NOT DETECTED NOT DETECTED Final   Enterococcus Faecium NOT DETECTED NOT DETECTED Final   Listeria monocytogenes NOT DETECTED NOT DETECTED Final   Staphylococcus species NOT DETECTED NOT DETECTED Final   Staphylococcus aureus (BCID) NOT DETECTED NOT DETECTED Final   Staphylococcus epidermidis NOT DETECTED NOT DETECTED Final   Staphylococcus lugdunensis NOT DETECTED NOT DETECTED Final   Streptococcus species NOT DETECTED NOT DETECTED Final   Streptococcus agalactiae NOT DETECTED NOT DETECTED Final   Streptococcus pneumoniae NOT DETECTED NOT DETECTED Final   Streptococcus pyogenes NOT DETECTED NOT DETECTED Final   A.calcoaceticus-baumannii NOT DETECTED NOT DETECTED Final   Bacteroides fragilis NOT DETECTED NOT DETECTED Final   Enterobacterales NOT DETECTED NOT DETECTED Final   Enterobacter cloacae complex NOT DETECTED NOT DETECTED Final   Escherichia coli NOT DETECTED NOT DETECTED Final   Klebsiella aerogenes NOT DETECTED NOT DETECTED Final   Klebsiella oxytoca NOT DETECTED NOT DETECTED Final   Klebsiella pneumoniae NOT DETECTED NOT DETECTED Final   Proteus species  NOT DETECTED NOT DETECTED Final   Salmonella species NOT DETECTED NOT DETECTED Final   Serratia marcescens NOT DETECTED NOT DETECTED Final   Haemophilus influenzae NOT DETECTED NOT DETECTED Final   Neisseria meningitidis NOT  DETECTED NOT DETECTED Final   Pseudomonas aeruginosa NOT DETECTED NOT DETECTED Final   Stenotrophomonas maltophilia NOT DETECTED NOT DETECTED Final   Candida albicans NOT DETECTED NOT DETECTED Final   Candida auris NOT DETECTED NOT DETECTED Final   Candida glabrata NOT DETECTED NOT DETECTED Final   Candida krusei NOT DETECTED NOT DETECTED Final   Candida parapsilosis NOT DETECTED NOT DETECTED Final   Candida tropicalis NOT DETECTED NOT DETECTED Final   Cryptococcus neoformans/gattii NOT DETECTED NOT DETECTED Final    Comment: Performed at [facility], [address] [place], [place] [id number]  Culture, blood (routine x 2)     Status: Abnormal   Collection Time: 02/04/21  6:25 PM   Specimen: BLOOD  Result Value Ref Range Status   Specimen Description [facility]LO[facility]  Final   Special Requests   Final    BOTTLES DRAWN AEROBIC AND ANAEROBIC Blood Culture adequate volume   Culture  Setup Time (A)  Final    GRAM VARIABLE ROD IN BOTH AEROBIC AND ANAEROBIC BOTTLES    Culture (A)  Final    LACTOBACILLUS CASEI Standardized susceptibility testing for this organism is not available. Performed at [facility], [address] [place], [place] [address]    Report Status 02/08/2021 FINAL  Final  Culture, blood (routine x 2)     Status: None (Preliminary result)   Collection Time: 02/08/21  3:43 PM   Specimen: BLOOD  Result Value Ref Range Status   Specimen Description BLOOD BLOOD RIGHT HAND  Final   Special Requests   Final    BOTTLES DRAWN AEROBIC AND ANAEROBIC Blood Culture adequate volume   Culture   Final    NO GROWTH < 24 HOURS Performed at [facility], [address] [place], [place] [address]    Report Status PENDING  Incomplete  Culture, blood (routine x 2)     Status: None (Preliminary result)   Collection Time: 02/08/21  3:43 PM   Specimen: BLOOD  Result Value Ref Range Status   Specimen Description BLOOD BLOOD LEFT HAND  Final   Special Requests    Final    BOTTLES DRAWN AEROBIC AND ANAEROBIC Blood Culture adequate volume   Culture   Final    NO GROWTH < 24 HOURS Performed at [facility], [address] [place], [place] [address]    Report Status PENDING  Incomplete    Coagulation Studies: No results for input(s): LABPROT, INR in the last 72 hours.  Urinalysis: No results for input(s): COLORURINE, LABSPEC, PHURINE, GLUCOSEU, HGBUR, BILIRUBINUR, KETONESUR, PROTEINUR, UROBILINOGEN, NITRITE, LEUKOCYTESUR in the last 72 hours.  Invalid input(s): APPERANCEUR    Imaging: No results found.   Medications:       Assessment/ Plan:  65 y.o. male with a PMHx of ESRD on HD TTS, anemia of chronic kidney disease, hypertension, acute respiratory failure status post tracheostomy placement, seizure disorder, hyperlipidemia, diabetes mellitus type 2, history of hyperkalemia, history of coronary artery disease, peripheral vascular disease who was admitted to [facility] on 11/21/2020 for ongoing treatment of acute respiratory failure status post tracheostomy placement and ESRD.   1.  ESRD on HD.   Patient due for hemodialysis treatment today.  Orders have been prepared.  2.  Acute respiratory failure.  Tracheostomy now capped.  Breathing comfortably.  3.  Anemia of chronic kidney disease.  Hemoglobin 7.4.  Has required periodic transfusion.  Continue to monitor CBC.  4.  Secondary hyperparathyroidism.  Phosphorus still low at 1.0.  Administer another 30 mmol of sodium phosphorus.  5.  Fever/sepsis.  Lactobacillus noted in the blood.  Probiotic was stopped.   LOS: 0 Jep Dyas 8/3/202210:11 AM

## 2021-02-11 LAB — RENAL FUNCTION PANEL
Albumin: 1.6 g/dL — ABNORMAL LOW (ref 3.5–5.0)
Anion gap: 20 — ABNORMAL HIGH (ref 5–15)
BUN: 66 mg/dL — ABNORMAL HIGH (ref 8–23)
CO2: 22 mmol/L (ref 22–32)
Calcium: 8.3 mg/dL — ABNORMAL LOW (ref 8.9–10.3)
Chloride: 97 mmol/L — ABNORMAL LOW (ref 98–111)
Creatinine, Ser: 3.55 mg/dL — ABNORMAL HIGH (ref 0.61–1.24)
GFR, Estimated: 18 mL/min — ABNORMAL LOW (ref >60)
Glucose, Bld: 142 mg/dL — ABNORMAL HIGH (ref 70–99)
Phosphorus: 10.7 mg/dL — ABNORMAL HIGH (ref 2.5–4.6)
Potassium: 3.9 mmol/L (ref 3.5–5.1)
Sodium: 139 mmol/L (ref 135–145)

## 2021-02-11 LAB — CBC
HCT: 18.8 % — ABNORMAL LOW (ref 39.0–52.0)
Hemoglobin: 6.4 g/dL — CL (ref 13.0–17.0)
MCH: 29.6 pg (ref 26.0–34.0)
MCHC: 34 g/dL (ref 30.0–36.0)
MCV: 87 fL (ref 80.0–100.0)
Platelets: 415 10*3/uL — ABNORMAL HIGH (ref 150–400)
RBC: 2.16 MIL/uL — ABNORMAL LOW (ref 4.22–5.81)
RDW: 17.6 % — ABNORMAL HIGH (ref 11.5–15.5)
WBC: 18.4 10*3/uL — ABNORMAL HIGH (ref 4.0–10.5)
nRBC: 0 % (ref 0.0–0.2)

## 2021-02-11 LAB — HEMOGLOBIN AND HEMATOCRIT, BLOOD
HCT: 23.9 % — ABNORMAL LOW (ref 39.0–52.0)
Hemoglobin: 8.3 g/dL — ABNORMAL LOW (ref 13.0–17.0)

## 2021-02-11 LAB — PREPARE RBC (CROSSMATCH)

## 2021-02-11 NOTE — Progress Notes (Signed)
[name]  ROUNDING NOTE   Subjective:  Patient due for hemodialysis treatment today. Resting comfortably in bed at the moment.   Objective:  Vital signs in last 24 hours:  Temperature 98.4 pulse 88 respirations 20 blood pressure 138/62  Physical Exam: General:  No acute distress  Head:  Normocephalic, atraumatic. 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  trace right lower extremity edema  Neurologic:  Eyes open, myoclonic facial twitching  Skin:  No acute rash  Access:  Right IJ temporary dialysis catheter    Basic Metabolic Panel: Recent Labs  Lab 02/07/21 0512 02/09/21 0441 02/11/21 0525  NA 130* 134* 139  K 4.5 4.4 3.9  CL 92* 93* 97*  CO2 20* 23 22  GLUCOSE 250* 247* 142*  BUN 164* 101* 66*  CREATININE 5.03* 4.05* 3.55*  CALCIUM 9.9 9.6 8.3*  PHOS <1.0* 1.0* 10.7*     Liver Function Tests: Recent Labs  Lab 02/07/21 0512 02/09/21 0441 02/11/21 0525  ALBUMIN 1.7* 1.6* 1.6*    No results for input(s): LIPASE, AMYLASE in the last 168 hours. No results for input(s): AMMONIA in the last 168 hours.  CBC: Recent Labs  Lab 02/07/21 0512 02/11/21 0525  WBC 21.0* 18.4*  HGB 7.4* 6.4*  HCT 21.8* 18.8*  MCV 88.3 87.0  PLT 342 415*     Cardiac Enzymes: No results for input(s): CKTOTAL, CKMB, CKMBINDEX, TROPONINI in the last 168 hours.  BNP: Invalid input(s): POCBNP  CBG: No results for input(s): GLUCAP in the last 168 hours.  Microbiology: Results for orders placed or performed during the hospital encounter of 11/21/20  Culture, blood (routine x 2)     Status: None   Collection Time: 11/25/20  3:25 PM   Specimen: BLOOD RIGHT HAND  Result Value Ref Range Status   Specimen Description BLOOD RIGHT HAND  Final   Special Requests   Final    BOTTLES DRAWN  AEROBIC ONLY Blood Culture results may not be optimal due to an inadequate volume of blood received in culture bottles   Culture   Final    NO GROWTH 5 DAYS Performed at [facility], [address] [place], [place] [id number]    Report Status 11/30/2020 FINAL  Final  Culture, blood (routine x 2)     Status: None   Collection Time: 11/25/20  3:29 PM   Specimen: BLOOD LEFT HAND  Result Value Ref Range Status   Specimen Description BLOOD LEFT HAND  Final   Special Requests   Final    BOTTLES DRAWN AEROBIC AND ANAEROBIC Blood Culture adequate volume   Culture   Final    NO GROWTH 5 DAYS Performed at [facility], [address] [place], [place] [id number]    Report Status 11/30/2020 FINAL  Final  Culture, Respiratory w Gram Stain     Status: None   Collection Time: 12/06/20 10:40 AM   Specimen: Tracheal Aspirate; Respiratory  Result Value Ref Range Status   Specimen Description TRACHEAL ASPIRATE  Final   Special Requests NONE  Final   Gram Stain   Final    ABUNDANT WBC PRESENT, PREDOMINANTLY PMN ABUNDANT GRAM NEGATIVE RODS FEW GRAM POSITIVE COCCI FEW GRAM POSITIVE RODS    Culture   Final  MODERATE PSEUDOMONAS AERUGINOSA Two isolates with different morphologies were identified as the same organism.The most resistant organism was reported. Performed at [facility], [address], [place], [place] [address]    Report Status 12/09/2020 FINAL  Final   Organism ID, Bacteria PSEUDOMONAS AERUGINOSA  Final      Susceptibility   Pseudomonas aeruginosa - MIC*    CEFTAZIDIME 16 INTERMEDIATE Intermediate     CIPROFLOXACIN 1 SENSITIVE Sensitive     GENTAMICIN 2 SENSITIVE Sensitive     IMIPENEM >=16 RESISTANT Resistant     * MODERATE PSEUDOMONAS AERUGINOSA  Culture, blood (routine x 2)     Status: None   Collection Time: 12/13/20  9:09 AM   Specimen: BLOOD LEFT HAND  Result Value Ref Range Status   Specimen Description BLOOD LEFT HAND  Final   Special Requests    Final    BOTTLES DRAWN AEROBIC AND ANAEROBIC Blood Culture results may not be optimal due to an inadequate volume of blood received in culture bottles   Culture   Final    NO GROWTH 5 DAYS Performed at [facility], [address] [place], [place] [address]    Report Status 12/18/2020 FINAL  Final  Culture, blood (routine x 2)     Status: Abnormal   Collection Time: 12/13/20  9:13 AM   Specimen: BLOOD  Result Value Ref Range Status   Specimen Description BLOOD RIGHT ANTECUBITAL  Final   Special Requests   Final    BOTTLES DRAWN AEROBIC AND ANAEROBIC Blood Culture adequate volume   Culture  Setup Time   Final    GRAM POSITIVE COCCI IN CLUSTERS AEROBIC BOTTLE ONLY CRITICAL RESULT CALLED TO, READ BACK BY AND VERIFIED WITH: RN C.ROBENSON AT 1234 ON 12/14/2020 BY T.SAAD.    Culture (A)  Final    STAPHYLOCOCCUS EPIDERMIDIS THE SIGNIFICANCE OF ISOLATING THIS ORGANISM FROM A SINGLE SET OF BLOOD CULTURES WHEN MULTIPLE SETS ARE DRAWN IS UNCERTAIN. PLEASE NOTIFY THE MICROBIOLOGY DEPARTMENT WITHIN ONE WEEK IF SPECIATION AND SENSITIVITIES ARE REQUIRED. Performed at [facility], [address] [place], [place] [address]    Report Status 12/16/2020 FINAL  Final  Blood Culture ID Panel (Reflexed)     Status: Abnormal   Collection Time: 12/13/20  9:13 AM  Result Value Ref Range Status   Enterococcus faecalis NOT DETECTED NOT DETECTED Final   Enterococcus Faecium NOT DETECTED NOT DETECTED Final   Listeria monocytogenes NOT DETECTED NOT DETECTED Final   Staphylococcus species DETECTED (A) NOT DETECTED Final    Comment: CRITICAL RESULT CALLED TO, READ BACK BY AND VERIFIED WITH: RN C.ROBERTSON AT 1234 ON 12/14/2020 BY T.SAAD.    Staphylococcus aureus (BCID) NOT DETECTED NOT DETECTED Final   Staphylococcus epidermidis DETECTED (A) NOT DETECTED Final    Comment: Methicillin (oxacillin) resistant coagulase negative staphylococcus. Possible blood culture contaminant (unless isolated from  more than one blood culture draw or clinical case suggests pathogenicity). No antibiotic treatment is indicated for blood  culture contaminants. CRITICAL RESULT CALLED TO, READ BACK BY AND VERIFIED WITH: RN C.ROBERTSON AT 1234 ON 12/14/2020 BY T.SAAD.    Staphylococcus lugdunensis NOT DETECTED NOT DETECTED Final   Streptococcus species NOT DETECTED NOT DETECTED Final   Streptococcus agalactiae NOT DETECTED NOT DETECTED Final   Streptococcus pneumoniae NOT DETECTED NOT DETECTED Final   Streptococcus pyogenes NOT DETECTED NOT DETECTED Final   A.calcoaceticus-baumannii NOT DETECTED NOT DETECTED Final   Bacteroides fragilis NOT DETECTED NOT DETECTED Final  Enterobacterales NOT DETECTED NOT DETECTED Final   Enterobacter cloacae complex NOT DETECTED NOT DETECTED Final   Escherichia coli NOT DETECTED NOT DETECTED Final   Klebsiella aerogenes NOT DETECTED NOT DETECTED Final   Klebsiella oxytoca NOT DETECTED NOT DETECTED Final   Klebsiella pneumoniae NOT DETECTED NOT DETECTED Final   Proteus species NOT DETECTED NOT DETECTED Final   Salmonella species NOT DETECTED NOT DETECTED Final   Serratia marcescens NOT DETECTED NOT DETECTED Final   Haemophilus influenzae NOT DETECTED NOT DETECTED Final   Neisseria meningitidis NOT DETECTED NOT DETECTED Final   Pseudomonas aeruginosa NOT DETECTED NOT DETECTED Final   Stenotrophomonas maltophilia NOT DETECTED NOT DETECTED Final   Candida albicans NOT DETECTED NOT DETECTED Final   Candida auris NOT DETECTED NOT DETECTED Final   Candida glabrata NOT DETECTED NOT DETECTED Final   Candida krusei NOT DETECTED NOT DETECTED Final   Candida parapsilosis NOT DETECTED NOT DETECTED Final   Candida tropicalis NOT DETECTED NOT DETECTED Final   Cryptococcus neoformans/gattii NOT DETECTED NOT DETECTED Final   Methicillin resistance mecA/C DETECTED (A) NOT DETECTED Final    Comment: CRITICAL RESULT CALLED TO, READ BACK BY AND VERIFIED WITH: RN C.ROBERTSON AT 1234 ON  12/14/2020 BY T.SAAD. Performed at [facility], [address] [place], [place] [address]   Culture, blood (routine x 2)     Status: None   Collection Time: 12/17/20 11:31 AM   Specimen: BLOOD  Result Value Ref Range Status   Specimen Description BLOOD RIGHT ANTECUBITAL  Final   Special Requests   Final    BOTTLES DRAWN AEROBIC ONLY Blood Culture results may not be optimal due to an inadequate volume of blood received in culture bottles   Culture   Final    NO GROWTH 5 DAYS Performed at [facility], [address], [place], [place] [address]    Report Status 12/22/2020 FINAL  Final  Culture, blood (routine x 2)     Status: None   Collection Time: 12/17/20 11:38 AM   Specimen: BLOOD LEFT HAND  Result Value Ref Range Status   Specimen Description BLOOD LEFT HAND  Final   Special Requests   Final    BOTTLES DRAWN AEROBIC ONLY Blood Culture adequate volume   Culture   Final    NO GROWTH 5 DAYS Performed at [facility], [address], [place], [place] [address]    Report Status 12/22/2020 FINAL  Final  Culture, blood (routine x 2)     Status: None   Collection Time: 12/29/20  8:10 AM   Specimen: BLOOD RIGHT HAND  Result Value Ref Range Status   Specimen Description BLOOD RIGHT HAND  Final   Special Requests   Final    BOTTLES DRAWN AEROBIC AND ANAEROBIC Blood Culture results may not be optimal due to an inadequate volume of blood received in culture bottles   Culture   Final    NO GROWTH 5 DAYS Performed at [facility], [address], [place], [place] [address]    Report Status 01/03/2021 FINAL  Final  Culture, blood (routine x 2)     Status: None   Collection Time: 12/29/20  8:17 AM   Specimen: BLOOD RIGHT HAND  Result Value Ref Range Status   Specimen Description BLOOD RIGHT HAND  Final   Special Requests   Final    BOTTLES DRAWN AEROBIC ONLY Blood Culture results may not be optimal due to an inadequate volume of  blood received in culture  bottles   Culture   Final    NO GROWTH 5 DAYS Performed at [facility], [address] [place], [place] [address]    Report Status 01/03/2021 FINAL  Final  Culture, Respiratory w Gram Stain     Status: None   Collection Time: 01/01/21 11:54 AM   Specimen: Tracheal Aspirate; Respiratory  Result Value Ref Range Status   Specimen Description TRACHEAL ASPIRATE  Final   Special Requests NONE  Final   Gram Stain   Final    RARE WBC PRESENT,BOTH PMN AND MONONUCLEAR FEW GRAM NEGATIVE RODS Performed at [facility], [address] [place], [place] [address]    Culture MODERATE PSEUDOMONAS AERUGINOSA  Final   Report Status 01/03/2021 FINAL  Final   Organism ID, Bacteria PSEUDOMONAS AERUGINOSA  Final      Susceptibility   Pseudomonas aeruginosa - MIC*    CEFTAZIDIME 4 SENSITIVE Sensitive     CIPROFLOXACIN 1 SENSITIVE Sensitive     GENTAMICIN <=1 SENSITIVE Sensitive     IMIPENEM 2 SENSITIVE Sensitive     PIP/TAZO <=4 SENSITIVE Sensitive     CEFEPIME 2 SENSITIVE Sensitive     * MODERATE PSEUDOMONAS AERUGINOSA  Culture, blood (routine x 2)     Status: None   Collection Time: 01/04/21 11:02 AM   Specimen: BLOOD RIGHT HAND  Result Value Ref Range Status   Specimen Description BLOOD RIGHT HAND  Final   Special Requests   Final    BOTTLES DRAWN AEROBIC AND ANAEROBIC Blood Culture adequate volume   Culture   Final    NO GROWTH 5 DAYS Performed at [facility], [address], [place], [place] [address]    Report Status 01/09/2021 FINAL  Final  Culture, blood (routine x 2)     Status: Abnormal   Collection Time: 01/04/21 11:11 AM   Specimen: BLOOD RIGHT FOREARM  Result Value Ref Range Status   Specimen Description BLOOD RIGHT FOREARM  Final   Special Requests   Final    BOTTLES DRAWN AEROBIC AND ANAEROBIC Blood Culture results may not be optimal due to an inadequate volume of blood received in culture bottles   Culture  Setup Time   Final    GRAM POSITIVE COCCI IN  CLUSTERS ANAEROBIC BOTTLE ONLY CRITICAL RESULT CALLED TO, READ BACK BY AND VERIFIED WITH: RN D [place] [id number] AT 1003 BY CM    Culture (A)  Final    STAPHYLOCOCCUS EPIDERMIDIS THE SIGNIFICANCE OF ISOLATING THIS ORGANISM FROM A SINGLE SET OF BLOOD CULTURES WHEN MULTIPLE SETS ARE DRAWN IS UNCERTAIN. PLEASE NOTIFY THE MICROBIOLOGY DEPARTMENT WITHIN ONE WEEK IF SPECIATION AND SENSITIVITIES ARE REQUIRED. Performed at [facility], [address], [place], [place] [address]    Report Status 01/07/2021 FINAL  Final  Culture, blood (routine x 2)     Status: None   Collection Time: 01/09/21  5:00 PM   Specimen: BLOOD  Result Value Ref Range Status   Specimen Description BLOOD RIGHT ANTECUBITAL  Final   Special Requests   Final    BOTTLES DRAWN AEROBIC AND ANAEROBIC Blood Culture results may not be optimal due to an inadequate volume of blood received in culture bottles   Culture   Final    NO GROWTH 5 DAYS Performed at [facility], [address], [place], [place] [address]    Report Status 01/14/2021 FINAL  Final  Culture, blood (routine x 2)  Status: Abnormal   Collection Time: 01/09/21  5:04 PM   Specimen: BLOOD  Result Value Ref Range Status   Specimen Description BLOOD RIGHT ANTECUBITAL  Final   Special Requests   Final    BOTTLES DRAWN AEROBIC ONLY Blood Culture results may not be optimal due to an inadequate volume of blood received in culture bottles   Culture  Setup Time   Final    GRAM POSITIVE COCCI IN CLUSTERS AEROBIC BOTTLE ONLY CRITICAL VALUE NOTED.  VALUE IS CONSISTENT WITH PREVIOUSLY REPORTED AND CALLED VALUE.    Culture (A)  Final    STAPHYLOCOCCUS CAPITIS THE SIGNIFICANCE OF ISOLATING THIS ORGANISM FROM A SINGLE SET OF BLOOD CULTURES WHEN MULTIPLE SETS ARE DRAWN IS UNCERTAIN. PLEASE NOTIFY THE MICROBIOLOGY DEPARTMENT WITHIN ONE WEEK IF SPECIATION AND SENSITIVITIES ARE REQUIRED. Performed at [facility], [address] [place], [place]  [address]    Report Status 01/11/2021 FINAL  Final  Culture, Respiratory w Gram Stain     Status: None   Collection Time: 01/14/21  6:04 PM   Specimen: Tracheal Aspirate; Respiratory  Result Value Ref Range Status   Specimen Description TRACHEAL ASPIRATE  Final   Special Requests NONE  Final   Gram Stain   Final    RARE WBC PRESENT,BOTH PMN AND MONONUCLEAR FEW SQUAMOUS EPITHELIAL CELLS PRESENT FEW GRAM NEGATIVE RODS RARE GRAM POSITIVE COCCI Performed at [facility], [address], [place], [place] [address]    Culture MODERATE PSEUDOMONAS AERUGINOSA  Final   Report Status 01/18/2021 FINAL  Final   Organism ID, Bacteria PSEUDOMONAS AERUGINOSA  Final      Susceptibility   Pseudomonas aeruginosa - MIC*    CEFTAZIDIME 4 SENSITIVE Sensitive     CIPROFLOXACIN 2 INTERMEDIATE Intermediate     GENTAMICIN 4 SENSITIVE Sensitive     IMIPENEM >=16 RESISTANT Resistant     CEFEPIME 4 SENSITIVE Sensitive     * MODERATE PSEUDOMONAS AERUGINOSA  Culture, blood (routine x 2)     Status: None   Collection Time: 01/24/21  2:20 PM   Specimen: BLOOD LEFT HAND  Result Value Ref Range Status   Specimen Description BLOOD LEFT HAND  Final   Special Requests   Final    BOTTLES DRAWN AEROBIC AND ANAEROBIC Blood Culture adequate volume   Culture   Final    NO GROWTH 5 DAYS Performed at [facility], [address] [place], [place] [address]    Report Status 01/29/2021 FINAL  Final  Culture, blood (routine x 2)     Status: None   Collection Time: 01/24/21  2:24 PM   Specimen: BLOOD LEFT HAND  Result Value Ref Range Status   Specimen Description BLOOD LEFT HAND  Final   Special Requests   Final    BOTTLES DRAWN AEROBIC AND ANAEROBIC Blood Culture results may not be optimal due to an inadequate volume of blood received in culture bottles   Culture   Final    NO GROWTH 5 DAYS Performed at [facility], [address] [place], [place] [address]    Report Status 01/29/2021 FINAL  Final   Culture, Respiratory w Gram Stain     Status: None   Collection Time: 02/01/21 12:02 PM   Specimen: Tracheal Aspirate; Respiratory  Result Value Ref Range Status   Specimen Description TRACHEAL ASPIRATE  Final   Special Requests NONE  Final   Gram Stain   Final    NO WBC  SEEN FEW GRAM VARIABLE ROD Performed at [facility], [address], [place], [place] [address]    Culture   Final    ABUNDANT PSEUDOMONAS AERUGINOSA ABUNDANT KLEBSIELLA PNEUMONIAE Confirmed Extended Spectrum Beta-Lactamase Producer (ESBL).  In bloodstream infections from ESBL organisms, carbapenems are preferred over piperacillin/tazobactam. They are shown to have a lower risk of mortality.    Report Status 02/04/2021 FINAL  Final   Organism ID, Bacteria PSEUDOMONAS AERUGINOSA  Final   Organism ID, Bacteria KLEBSIELLA PNEUMONIAE  Final      Susceptibility   Klebsiella pneumoniae - MIC*    AMPICILLIN >=32 RESISTANT Resistant     CEFAZOLIN >=64 RESISTANT Resistant     CEFEPIME >=32 RESISTANT Resistant     CEFTAZIDIME >=64 RESISTANT Resistant     CEFTRIAXONE >=64 RESISTANT Resistant     CIPROFLOXACIN >=4 RESISTANT Resistant     GENTAMICIN >=16 RESISTANT Resistant     IMIPENEM <=0.25 SENSITIVE Sensitive     TRIMETH/SULFA >=320 RESISTANT Resistant     AMPICILLIN/SULBACTAM >=32 RESISTANT Resistant     PIP/TAZO >=128 RESISTANT Resistant     * ABUNDANT KLEBSIELLA PNEUMONIAE   Pseudomonas aeruginosa - MIC*    CEFTAZIDIME 4 SENSITIVE Sensitive     CIPROFLOXACIN 2 INTERMEDIATE Intermediate     GENTAMICIN 4 SENSITIVE Sensitive     IMIPENEM 2 SENSITIVE Sensitive     PIP/TAZO 16 SENSITIVE Sensitive     * ABUNDANT PSEUDOMONAS AERUGINOSA  Culture, blood (routine x 2)     Status: Abnormal   Collection Time: 02/04/21  6:10 PM   Specimen: BLOOD RIGHT HAND  Result Value Ref Range Status   Specimen Description BLOOD RIGHT HAND  Final   Special Requests   Final    BOTTLES DRAWN AEROBIC AND ANAEROBIC Blood Culture  adequate volume   Culture  Setup Time (A)  Final    GRAM VARIABLE ROD IN BOTH AEROBIC AND ANAEROBIC BOTTLES CRITICAL RESULT CALLED TO, READ BACK BY AND VERIFIED WITH: T,ROBINSON RN  02/06/21 EB    Culture (A)  Final    LACTOBACILLUS CASEI Standardized susceptibility testing for this organism is not available. Performed at [facility], [address], [place], [place] [address]    Report Status 02/08/2021 FINAL  Final  Blood Culture ID Panel (Reflexed)     Status: None   Collection Time: 02/04/21  6:10 PM  Result Value Ref Range Status   Enterococcus faecalis NOT DETECTED NOT DETECTED Final   Enterococcus Faecium NOT DETECTED NOT DETECTED Final   Listeria monocytogenes NOT DETECTED NOT DETECTED Final   Staphylococcus species NOT DETECTED NOT DETECTED Final   Staphylococcus aureus (BCID) NOT DETECTED NOT DETECTED Final   Staphylococcus epidermidis NOT DETECTED NOT DETECTED Final   Staphylococcus lugdunensis NOT DETECTED NOT DETECTED Final   Streptococcus species NOT DETECTED NOT DETECTED Final   Streptococcus agalactiae NOT DETECTED NOT DETECTED Final   Streptococcus pneumoniae NOT DETECTED NOT DETECTED Final   Streptococcus pyogenes NOT DETECTED NOT DETECTED Final   A.calcoaceticus-baumannii NOT DETECTED NOT DETECTED Final   Bacteroides fragilis NOT DETECTED NOT DETECTED Final   Enterobacterales NOT DETECTED NOT DETECTED Final   Enterobacter cloacae complex NOT DETECTED NOT DETECTED Final   Escherichia coli NOT DETECTED NOT DETECTED Final   Klebsiella aerogenes NOT DETECTED NOT DETECTED Final   Klebsiella oxytoca NOT DETECTED NOT DETECTED Final   Klebsiella pneumoniae NOT DETECTED NOT DETECTED Final   Proteus species NOT DETECTED NOT DETECTED Final   Salmonella species NOT  DETECTED NOT DETECTED Final   Serratia marcescens NOT DETECTED NOT DETECTED Final   Haemophilus influenzae NOT DETECTED NOT DETECTED Final   Neisseria meningitidis NOT DETECTED NOT DETECTED Final    Pseudomonas aeruginosa NOT DETECTED NOT DETECTED Final   Stenotrophomonas maltophilia NOT DETECTED NOT DETECTED Final   Candida albicans NOT DETECTED NOT DETECTED Final   Candida auris NOT DETECTED NOT DETECTED Final   Candida glabrata NOT DETECTED NOT DETECTED Final   Candida krusei NOT DETECTED NOT DETECTED Final   Candida parapsilosis NOT DETECTED NOT DETECTED Final   Candida tropicalis NOT DETECTED NOT DETECTED Final   Cryptococcus neoformans/gattii NOT DETECTED NOT DETECTED Final    Comment: Performed at [facility], [address], [place], [place] [address]  Culture, blood (routine x 2)     Status: Abnormal   Collection Time: 02/04/21  6:25 PM   Specimen: BLOOD  Result Value Ref Range Status   Specimen Description BLOOD RIGHT ANTECUBITAL  Final   Special Requests   Final    BOTTLES DRAWN AEROBIC AND ANAEROBIC Blood Culture adequate volume   Culture  Setup Time (A)  Final    GRAM VARIABLE ROD IN BOTH AEROBIC AND ANAEROBIC BOTTLES    Culture (A)  Final    LACTOBACILLUS CASEI Standardized susceptibility testing for this organism is not available. Performed at [facility], [address], [place], [place] [address]    Report Status 02/08/2021 FINAL  Final  Culture, blood (routine x 2)     Status: None (Preliminary result)   Collection Time: 02/08/21  3:43 PM   Specimen: BLOOD  Result Value Ref Range Status   Specimen Description BLOOD BLOOD RIGHT HAND  Final   Special Requests   Final    BOTTLES DRAWN AEROBIC AND ANAEROBIC Blood Culture adequate volume   Culture  Setup Time   Final    GRAM POSITIVE RODS ANAEROBIC BOTTLE ONLY CRITICAL RESULT CALLED TO, READ BACK BY AND VERIFIED WITH: C NESKEO,RN@[id number]  02/11/21 MKELLY    Culture   Final    NO GROWTH 3 DAYS Performed at [facility], [address] [place], [place] [address]    Report Status PENDING  Incomplete  Culture, blood (routine x 2)     Status: None (Preliminary result)   Collection Time:  02/08/21  3:43 PM   Specimen: BLOOD  Result Value Ref Range Status   Specimen Description BLOOD BLOOD LEFT HAND  Final   Special Requests   Final    BOTTLES DRAWN AEROBIC AND ANAEROBIC Blood Culture adequate volume   Culture   Final    NO GROWTH 3 DAYS Performed at [facility], [address], [place], [place] [address]    Report Status PENDING  Incomplete    Coagulation Studies: No results for input(s): LABPROT, INR in the last 72 hours.  Urinalysis: No results for input(s): COLORURINE, LABSPEC, PHURINE, GLUCOSEU, HGBUR, BILIRUBINUR, KETONESUR, PROTEINUR, UROBILINOGEN, NITRITE, LEUKOCYTESUR in the last 72 hours.  Invalid input(s): APPERANCEUR    Imaging: No results found.   Medications:       Assessment/ Plan:  65 y.o. male with a PMHx of ESRD on HD TTS, anemia of chronic kidney disease, hypertension, acute respiratory failure status post tracheostomy placement, seizure disorder, hyperlipidemia, diabetes mellitus type 2, history of hyperkalemia, history of coronary artery disease, peripheral vascular disease who was admitted to [facility] on 11/21/2020 for ongoing treatment of acute respiratory failure status post tracheostomy placement and ESRD.  1.  ESRD on HD.   Patient will continue dialysis on MWF schedule.  Appears to be tolerating well.  2.  Acute respiratory failure.  Patient's respiratory status overall appears to be stable with the Tracheostomy.  3.  Anemia of chronic kidney disease.  Hemoglobin has dropped to 6.4.  Recommend blood transfusion but defer to primary team.  Blood transfusion has been ordered.  4.  Secondary hyperparathyroidism.  Serum phosphorus up significantly now with repletion.  Should come down with dialysis treatments.  5.  Fever/sepsis.  Lactobacillus noted in the blood.  Probiotic was stopped.   LOS: 0 Evangelynn Lochridge 8/5/202211:20 AM

## 2021-02-12 LAB — TYPE AND SCREEN
ABO/RH(D): O POS
Antibody Screen: NEGATIVE
Unit division: 0

## 2021-02-12 LAB — BPAM RBC
Blood Product Expiration Date: 202209062359
ISSUE DATE / TIME: 202208051534
Unit Type and Rh: 5100

## 2021-02-13 LAB — CULTURE, BLOOD (ROUTINE X 2)
Special Requests: ADEQUATE
Special Requests: ADEQUATE

## 2021-02-14 LAB — CBC
HCT: 21.2 % — ABNORMAL LOW (ref 39.0–52.0)
Hemoglobin: 7 g/dL — ABNORMAL LOW (ref 13.0–17.0)
MCH: 29.3 pg (ref 26.0–34.0)
MCHC: 33 g/dL (ref 30.0–36.0)
MCV: 88.7 fL (ref 80.0–100.0)
Platelets: 487 10*3/uL — ABNORMAL HIGH (ref 150–400)
RBC: 2.39 MIL/uL — ABNORMAL LOW (ref 4.22–5.81)
RDW: 16.8 % — ABNORMAL HIGH (ref 11.5–15.5)
WBC: 17.6 10*3/uL — ABNORMAL HIGH (ref 4.0–10.5)
nRBC: 0 % (ref 0.0–0.2)

## 2021-02-14 LAB — RENAL FUNCTION PANEL
Albumin: 1.6 g/dL — ABNORMAL LOW (ref 3.5–5.0)
Anion gap: 18 — ABNORMAL HIGH (ref 5–15)
BUN: 79 mg/dL — ABNORMAL HIGH (ref 8–23)
CO2: 23 mmol/L (ref 22–32)
Calcium: 9.2 mg/dL (ref 8.9–10.3)
Chloride: 94 mmol/L — ABNORMAL LOW (ref 98–111)
Creatinine, Ser: 4.44 mg/dL — ABNORMAL HIGH (ref 0.61–1.24)
GFR, Estimated: 14 mL/min — ABNORMAL LOW (ref >60)
Glucose, Bld: 96 mg/dL (ref 70–99)
Phosphorus: 7.3 mg/dL — ABNORMAL HIGH (ref 2.5–4.6)
Potassium: 4.8 mmol/L (ref 3.5–5.1)
Sodium: 135 mmol/L (ref 135–145)

## 2021-02-14 LAB — VANCOMYCIN, TROUGH: Vancomycin Tr: 12 ug/mL — ABNORMAL LOW (ref 15–20)

## 2021-02-14 LAB — HEMOGLOBIN AND HEMATOCRIT, BLOOD
HCT: 25.8 % — ABNORMAL LOW (ref 39.0–52.0)
Hemoglobin: 8.6 g/dL — ABNORMAL LOW (ref 13.0–17.0)

## 2021-02-14 LAB — PREPARE RBC (CROSSMATCH)

## 2021-02-14 LAB — PHENYTOIN LEVEL, TOTAL: Phenytoin Lvl: 6.1 ug/mL — ABNORMAL LOW (ref 10.0–20.0)

## 2021-02-14 LAB — OCCULT BLOOD X 1 CARD TO LAB, STOOL: Fecal Occult Bld: POSITIVE — AB

## 2021-02-14 NOTE — Progress Notes (Signed)
PROGRESS NOTE    Joel Hebert  HOZ:[id number] DOB: [birth year] DOA: 11/21/2020   Brief Narrative:  Joel Hebert is an 65 y.o. 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  In the emergency room patient apparently was found to be agitated and combative, not following any commands.  He was recently admitted in the nursing home after being discharged from [facility] with right foot pain on 08/22/2020 with decreased circulation and underwent procedure to open his arteries on 09/03/2020.  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  He was transferred to the nursing home on cefepime 2 g every 8 hours with stop date on 10/23/2020.  He was admitted to the outside facility for altered mental status thought to be secondary to hypertensive encephalopathy with hypertensive emergency.  He was started on Cleviprex drip.  His troponin was elevated and he was started on heparin drip.  On 10/19/2020 rapid response was called for altered mental status and desaturation.  He had to be intubated due to worsening respiratory distress.  He was seen by nephrology for worsening azotemia during his hospital stay and was started on hemodialysis.  He was found to have heme positive stool and was transfused 1 unit PRBCs on 10/23/2020.  His blood pressures remained elevated.  He was started on isosorbide dinitrate drip.  On 10/27/2020 patient apparently self extubated, was transitioned to BiPAP.  However, he had a cord of unknown downtime with return of spontaneous circulation after 5 rounds of compressions, epinephrine x2.  He was found to be  actively seizing.  He was seen by neurology and started on valproic acid, Klonopin, fosphenytoin IV, Vimpat, phenobarbital.  He also received treatment with antibiotics with ceftazidime and IV Bactrim for pneumonia.  His sputum culture was positive for Pseudomonas and Klebsiella which was Carbapenem resistant.  Patient also received treatment with inhaled tobramycin.  Due to his complex medical problems he was transferred and admitted to [facility].  Patient continuing to have fever. He has HD catheter in place.  Blood cultures from 01/04/2021 showed staph epidermidis.  He also had blood cultures from 12/13/2020 which showed staph epidermidis.  He received treatment with multiple antibiotics.  He had catheter removed.  He again had blood cultures collected on 01/09/2021 which showed Staph capitis likely contaminant.  He completed treatment with IV vancomycin, cefepime.  Respiratory cultures from 01/14/2021 showed moderate Pseudomonas aeruginosa for which he was treated with ceftazidime.   02/14/2021: He again had worsening leukocytosis with fever.  Blood cultures from 02/04/2021 and again from 02/08/2021 that showed lactobacillus species in both the bottles.   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  His previous  respiratory culture showed Pseudomonas for which he received treatment with cefepime.  He had ongoing fevers and leukocytosis.  Again treated with another round of IV vancomycin, cefepime.  Respiratory cultures from 01/14/2021 again showed Pseudomonas for which he was treated with ceftazidime.  After that he had fevers again with worsening leukocytosis this time with blood cultures that showed lactobacillus species.  Therefore to be restarted on meropenem.  Continue to monitor closely.  He unfortunately has dysphagia and high risk for recurrent aspiration and aspiration pneumonia despite being on antibiotics. If his respiratory status worsens suggest repeat chest imaging preferably chest CT which can be done without contrast due to the acute on chronic renal failure.   Pneumonia: He has had pneumonia with respiratory cultures have previously showed Pseudomonas.  He already received treatment with multiple rounds of antibiotics.  Repeat respiratory cultures again showed Pseudomonas. He received treatment with ceftazidime.  Now started on meropenem for the lactobacillus bacteremia.  Unfortunately also has dysphagia secondary to anoxic brain injury and high risk for recurrent aspiration and aspiration pneumonia despite being on antibiotics. Again, as mentioned above he is high risk for worsening respiratory failure due to his aspiration despite being on antibiotics.  If his respiratory status worsens, suggest repeat chest imaging preferably chest CT.  Continue to monitor BUN/creatinine closely.  Adjust antibiotics accordingly.   Fever/leukocytosis: In the setting of sepsis.  He has received treatment with multiple rounds of antibiotics.  Recently treated with ceftazidime and secondary to respiratory cultures again showing Pseudomonas.  Now blood cultures are showing lactobacillus species.  Therefore recommended to start him on meropenem.  Continue to monitor closely.  If he is continuing to have fevers greater than 101 and worsening leukocytosis recommend to send for pancultures and repeat imaging as mentioned above.  Sepsis: Gram-positive  bacteremia.  Blood cultures from 02/04/2021 and again from 02/08/2021 continue to show lactobacillus species.  In the setting of fever, worsening leukocytosis and the bacteremia treating with meropenem.  He is also on probiotic lactobacillus.  Recommend to stop the probiotic lactobacillus as sometimes this can lead to bacteremia and sepsis in immunocompromised patients.  Recommend to send for repeat blood cultures.  Plan to treat for duration of 10-14 days.   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  Unfortunately he continues to remain unresponsive.  Continue medications per primary team.   Acute on chronic kidney disease: Patient on hemodialysis.  Nephrology following.  Antibiotics renally dosed.  Further management per primary team and nephrology.   Diabetic foot infection with gangrene: He is status post right transmetatarsal amputation.  Continue local wound care.  He also has left BKA.  If wound worsening we may need to repeat imaging.  On antibiotics as mentioned above.   Diabetes mellitus: Continue to monitor, medication and management of diabetes per primary team.   Dysphagia/protein calorie malnutrition: Management per the primary team.  Due to his dysphagia he is high risk for ongoing aspiration and recurrent aspiration pneumonia despite being on antibiotics.   Unfortunately due to his multiple complex medical problems he is very high risk for worsening and decompensation.  Plan of care discussed with the primary team.  Subjective: He remains encephalopathic.  Blood cultures showing lactobacillus species 4/4 bottles associated with fever and worsening leukocytosis.    Objective: Vitals: Temperature 97.7, pulse 87, respiratory 30, blood pressure 181/66, pulse oximetry 97%  Examination: Constitutional: Ill-appearing male, remains encephalopathic, not following commands Head: Atraumatic,  normocephalic Eyes:  PERLA ENMT: external ears and nose appear normal, Lips appears normal, moist oral mucosa  Neck: Has trach in place CVS: S1-S2 Respiratory: Coarse breath sounds, scattered rhonchi  Abdomen: Obese, soft, positive bowel sounds  Musculoskeletal: Left BKA, right TMA with dressing in place Neuro: Encephalopathic.  Unable to do neurologic exam. Psych: Unable to assess at this time skin: no rashes    Data Reviewed: I have personally reviewed following labs and imaging studies  CBC: Recent Labs  Lab 02/11/21 0525 02/11/21 1941 02/14/21 0618 02/14/21 2009  WBC 18.4*  --  17.6*  --   HGB 6.4* 8.3* 7.0* 8.6*  HCT 18.8* 23.9* 21.2* 25.8*  MCV 87.0  --  88.7  --   PLT 415*  --  487*  --      Basic Metabolic Panel: Recent Labs  Lab 02/09/21 0441 02/11/21 0525 02/14/21 0618  NA 134* 139 135  K 4.4 3.9 4.8  CL 93* 97* 94*  CO2 23 22 23   GLUCOSE 247* 142* 96  BUN 101* 66* 79*  CREATININE 4.05* 3.55* 4.44*  CALCIUM 9.6 8.3* 9.2  PHOS 1.0* 10.7* 7.3*     GFR: CrCl cannot be calculated (Unknown ideal weight.).  Liver Function Tests: Recent Labs  Lab 02/09/21 0441 02/11/21 0525 02/14/21 0618  ALBUMIN 1.6* 1.6* 1.6*     CBG: No results for input(s): GLUCAP in the last 168 hours.   Recent Results (from the past 240 hour(s))  Culture, blood (routine x 2)     Status: Abnormal   Collection Time: 02/08/21  3:43 PM   Specimen: BLOOD  Result Value Ref Range Status   Specimen Description BLOOD BLOOD RIGHT HAND  Final   Special Requests   Final    BOTTLES DRAWN AEROBIC AND ANAEROBIC Blood Culture adequate volume   Culture  Setup Time   Final    GRAM POSITIVE RODS IN BOTH AEROBIC AND ANAEROBIC BOTTLES CRITICAL RESULT CALLED TO, READ BACK BY AND VERIFIED WITH: C NESKEO,RN@0622  02/11/21 MKELLY    Culture (A)  Final    LACTOBACILLUS SPECIES Standardized susceptibility testing for this organism is not available. Performed at [facility], [address],  [place], [place]    Report Status 02/13/2021 FINAL  Final  Culture, blood (routine x 2)     Status: Abnormal   Collection Time: 02/08/21  3:43 PM   Specimen: BLOOD LEFT HAND  Result Value Ref Range Status   Specimen Description BLOOD LEFT HAND  Final   Special Requests   Final    BOTTLES DRAWN AEROBIC AND ANAEROBIC Blood Culture adequate volume   Culture  Setup Time (A)  Final    GRAM VARIABLE ROD IN BOTH AEROBIC AND ANAEROBIC BOTTLES CRITICAL VALUE NOTED.  VALUE IS CONSISTENT WITH PREVIOUSLY REPORTED AND CALLED VALUE.    Culture (A)  Final    LACTOBACILLUS SPECIES Standardized susceptibility testing for this organism is not available. Performed at [facility], [address], [place], [place]    Report Status 02/13/2021 FINAL  Final      Radiology Studies: No results found.  Scheduled Meds: Please see MAR   04/15/2021, MD   02/14/2021, 11:46 PM

## 2021-02-14 NOTE — Progress Notes (Signed)
[facility]  ROUNDING NOTE   Subjective:  Patient appears to be resting comfortably at the moment. Continues on dialysis on MWF schedule. Phosphorus is down a bit to 7.3 post repletion.   Objective:  Vital signs in last 24 hours:  Temperature 97.7 pulse 87 respirations 30 blood pressure 101/66  Physical Exam: General:  No acute distress  Head:  Normocephalic, atraumatic. 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  trace right lower extremity edema  Neurologic:  myoclonic facial twitching noted  Skin:  No acute rash  Access:  Right IJ temporary dialysis catheter    Basic Metabolic Panel: Recent Labs  Lab 02/09/21 0441 02/11/21 0525 02/14/21 0618  NA 134* 139 135  K 4.4 3.9 4.8  CL 93* 97* 94*  CO2 GLUCOSE 247* 142* 96  BUN 101* 66* 79*  CREATININE 4.05* 3.55* 4.44*  CALCIUM 9.6 8.3* 9.2  PHOS 1.0* 10.7* 7.3*     Liver Function Tests: Recent Labs  Lab 02/09/21 0441 02/11/21 0525 02/14/21 0618  ALBUMIN 1.6* 1.6* 1.6*    No results for input(s): LIPASE, AMYLASE in the last 168 hours. No results for input(s): AMMONIA in the last 168 hours.  CBC: Recent Labs  Lab 02/11/21 0525 02/11/21 1941 02/14/21 0618  WBC 18.4*  --  17.6*  HGB 6.4* 8.3* 7.0*  HCT 18.8* 23.9* 21.2*  MCV 87.0  --  88.7  PLT 415*  --  487*     Cardiac Enzymes: No results for input(s): CKTOTAL, CKMB, CKMBINDEX, TROPONINI in the last 168 hours.  BNP: Invalid input(s): POCBNP  CBG: No results for input(s): GLUCAP in the last 168 hours.  Microbiology: Results for orders placed or performed during the hospital encounter of 11/21/20  Culture, blood (routine x 2)     Status: None   Collection Time: 11/25/20  3:25 PM   Specimen: BLOOD RIGHT HAND  Result Value Ref Range Status    Specimen Description BLOOD RIGHT HAND  Final   Special Requests   Final    BOTTLES DRAWN AEROBIC ONLY Blood Culture results may not be optimal due to an inadequate volume of blood received in culture bottles   Culture   Final    NO GROWTH 5 DAYS Performed at [facility], [address], [place], [place] [address]    Report Status 11/30/2020 FINAL  Final  Culture, blood (routine x 2)     Status: None   Collection Time: 11/25/20  3:29 PM   Specimen: BLOOD LEFT HAND  Result Value Ref Range Status   Specimen Description BLOOD LEFT HAND  Final   Special Requests   Final    BOTTLES DRAWN AEROBIC AND ANAEROBIC Blood Culture adequate volume   Culture   Final    NO GROWTH 5 DAYS Performed at [facility], [address] [place], [place] [address]    Report Status 11/30/2020 FINAL  Final  Culture, Respiratory w Gram Stain     Status: None   Collection Time: 12/06/20 10:40 AM   Specimen: Tracheal Aspirate; Respiratory  Result Value Ref Range Status   Specimen Description TRACHEAL ASPIRATE  Final   Special Requests NONE  Final   Gram Stain   Final    ABUNDANT WBC PRESENT, PREDOMINANTLY  PMN ABUNDANT GRAM NEGATIVE RODS FEW GRAM POSITIVE COCCI FEW GRAM POSITIVE RODS    Culture   Final    MODERATE PSEUDOMONAS AERUGINOSA Two isolates with different morphologies were identified as the same organism.The most resistant organism was reported. Performed at [facility], [address] [place], [place] [address]    Report Status 12/09/2020 FINAL  Final   Organism ID, Bacteria PSEUDOMONAS AERUGINOSA  Final      Susceptibility   Pseudomonas aeruginosa - MIC*    CEFTAZIDIME 16 INTERMEDIATE Intermediate     CIPROFLOXACIN 1 SENSITIVE Sensitive     GENTAMICIN 2 SENSITIVE Sensitive     IMIPENEM >=16 RESISTANT Resistant     * MODERATE PSEUDOMONAS AERUGINOSA  Culture, blood (routine x 2)     Status: None   Collection Time: 12/13/20  9:09 AM   Specimen: BLOOD LEFT HAND  Result  Value Ref Range Status   Specimen Description BLOOD LEFT HAND  Final   Special Requests   Final    BOTTLES DRAWN AEROBIC AND ANAEROBIC Blood Culture results may not be optimal due to an inadequate volume of blood received in culture bottles   Culture   Final    NO GROWTH 5 DAYS Performed at [facility], [address], [place], [place] [address]    Report Status 12/18/2020 FINAL  Final  Culture, blood (routine x 2)     Status: Abnormal   Collection Time: 12/13/20  9:13 AM   Specimen: BLOOD  Result Value Ref Range Status   Specimen Description BLOOD RIGHT ANTECUBITAL  Final   Special Requests   Final    BOTTLES DRAWN AEROBIC AND ANAEROBIC Blood Culture adequate volume   Culture  Setup Time   Final    GRAM POSITIVE COCCI IN CLUSTERS AEROBIC BOTTLE ONLY CRITICAL RESULT CALLED TO, READ BACK BY AND VERIFIED WITH: RN C.ROBENSON AT 1234 ON 12/14/2020 BY T.SAAD.    Culture (A)  Final    STAPHYLOCOCCUS EPIDERMIDIS THE SIGNIFICANCE OF ISOLATING THIS ORGANISM FROM A SINGLE SET OF BLOOD CULTURES WHEN MULTIPLE SETS ARE DRAWN IS UNCERTAIN. PLEASE NOTIFY THE MICROBIOLOGY DEPARTMENT WITHIN ONE WEEK IF SPECIATION AND SENSITIVITIES ARE REQUIRED. Performed at [facility] Lab, [address], [place], [place] [address]    Report Status 12/16/2020 FINAL  Final  Blood Culture ID Panel (Reflexed)     Status: Abnormal   Collection Time: 12/13/20  9:13 AM  Result Value Ref Range Status   Enterococcus faecalis NOT DETECTED NOT DETECTED Final   Enterococcus Faecium NOT DETECTED NOT DETECTED Final   Listeria monocytogenes NOT DETECTED NOT DETECTED Final   Staphylococcus species DETECTED (A) NOT DETECTED Final    Comment: CRITICAL RESULT CALLED TO, READ BACK BY AND VERIFIED WITH: RN C.ROBERTSON AT 1234 ON 12/14/2020 BY T.SAAD.    Staphylococcus aureus (BCID) NOT DETECTED NOT DETECTED Final   Staphylococcus epidermidis DETECTED (A) NOT DETECTED Final    Comment: Methicillin (oxacillin) resistant  coagulase negative staphylococcus. Possible blood culture contaminant (unless isolated from more than one blood culture draw or clinical case suggests pathogenicity). No antibiotic treatment is indicated for blood  culture contaminants. CRITICAL RESULT CALLED TO, READ BACK BY AND VERIFIED WITH: RN C.ROBERTSON AT 1234 ON 12/14/2020 BY T.SAAD.    Staphylococcus lugdunensis NOT DETECTED NOT DETECTED Final   Streptococcus species NOT DETECTED NOT DETECTED Final   Streptococcus agalactiae NOT DETECTED NOT DETECTED Final   Streptococcus pneumoniae NOT DETECTED NOT DETECTED Final   Streptococcus pyogenes  NOT DETECTED NOT DETECTED Final   A.calcoaceticus-baumannii NOT DETECTED NOT DETECTED Final   Bacteroides fragilis NOT DETECTED NOT DETECTED Final   Enterobacterales NOT DETECTED NOT DETECTED Final   Enterobacter cloacae complex NOT DETECTED NOT DETECTED Final   Escherichia coli NOT DETECTED NOT DETECTED Final   Klebsiella aerogenes NOT DETECTED NOT DETECTED Final   Klebsiella oxytoca NOT DETECTED NOT DETECTED Final   Klebsiella pneumoniae NOT DETECTED NOT DETECTED Final   Proteus species NOT DETECTED NOT DETECTED Final   Salmonella species NOT DETECTED NOT DETECTED Final   Serratia marcescens NOT DETECTED NOT DETECTED Final   Haemophilus influenzae NOT DETECTED NOT DETECTED Final   Neisseria meningitidis NOT DETECTED NOT DETECTED Final   Pseudomonas aeruginosa NOT DETECTED NOT DETECTED Final   Stenotrophomonas maltophilia NOT DETECTED NOT DETECTED Final   Candida albicans NOT DETECTED NOT DETECTED Final   Candida auris NOT DETECTED NOT DETECTED Final   Candida glabrata NOT DETECTED NOT DETECTED Final   Candida krusei NOT DETECTED NOT DETECTED Final   Candida parapsilosis NOT DETECTED NOT DETECTED Final   Candida tropicalis NOT DETECTED NOT DETECTED Final   Cryptococcus neoformans/gattii NOT DETECTED NOT DETECTED Final   Methicillin resistance mecA/C DETECTED (A) NOT DETECTED Final     Comment: CRITICAL RESULT CALLED TO, READ BACK BY AND VERIFIED WITH: RN C.ROBERTSON AT 1234 ON 12/14/2020 BY T.SAAD. Performed at [facility], [address] [place], [place] [address]   Culture, blood (routine x 2)     Status: None   Collection Time: 12/17/20 11:31 AM   Specimen: BLOOD  Result Value Ref Range Status   Specimen Description BLOOD RIGHT ANTECUBITAL  Final   Special Requests   Final    BOTTLES DRAWN AEROBIC ONLY Blood Culture results may not be optimal due to an inadequate volume of blood received in culture bottles   Culture   Final    NO GROWTH 5 DAYS Performed at [facility], [address] [place], [place] [address]    Report Status 12/22/2020 FINAL  Final  Culture, blood (routine x 2)     Status: None   Collection Time: 12/17/20 11:38 AM   Specimen: BLOOD LEFT HAND  Result Value Ref Range Status   Specimen Description BLOOD LEFT HAND  Final   Special Requests   Final    BOTTLES DRAWN AEROBIC ONLY Blood Culture adequate volume   Culture   Final    NO GROWTH 5 DAYS Performed at [facility], [address] [place], [place] [address]    Report Status 12/22/2020 FINAL  Final  Culture, blood (routine x 2)     Status: None   Collection Time: 12/29/20  8:10 AM   Specimen: BLOOD RIGHT HAND  Result Value Ref Range Status   Specimen Description BLOOD RIGHT HAND  Final   Special Requests   Final    BOTTLES DRAWN AEROBIC AND ANAEROBIC Blood Culture results may not be optimal due to an inadequate volume of blood received in culture bottles   Culture   Final    NO GROWTH 5 DAYS Performed at [facility]  Lab, [address] [place], [place] [address]    Report Status 01/03/2021 FINAL  Final  Culture, blood (routine x 2)     Status: None   Collection Time: 12/29/20  8:17 AM   Specimen: BLOOD RIGHT HAND  Result Value Ref Range Status   Specimen Description BLOOD RIGHT HAND  Final   Special  Requests   Final    BOTTLES DRAWN AEROBIC ONLY Blood  Culture results may not be optimal due to an inadequate volume of blood received in culture bottles   Culture   Final    NO GROWTH 5 DAYS Performed at [facility], [address], [place], [place] [address]    Report Status 01/03/2021 FINAL  Final  Culture, Respiratory w Gram Stain     Status: None   Collection Time: 01/01/21 11:54 AM   Specimen: Tracheal Aspirate; Respiratory  Result Value Ref Range Status   Specimen Description TRACHEAL ASPIRATE  Final   Special Requests NONE  Final   Gram Stain   Final    RARE WBC PRESENT,BOTH PMN AND MONONUCLEAR FEW GRAM NEGATIVE RODS Performed at [facility], [address] [place], [place] [address]    Culture MODERATE PSEUDOMONAS AERUGINOSA  Final   Report Status 01/03/2021 FINAL  Final   Organism ID, Bacteria PSEUDOMONAS AERUGINOSA  Final      Susceptibility   Pseudomonas aeruginosa - MIC*    CEFTAZIDIME 4 SENSITIVE Sensitive     CIPROFLOXACIN 1 SENSITIVE Sensitive     GENTAMICIN <=1 SENSITIVE Sensitive     IMIPENEM 2 SENSITIVE Sensitive     PIP/TAZO <=4 SENSITIVE Sensitive     CEFEPIME 2 SENSITIVE Sensitive     * MODERATE PSEUDOMONAS AERUGINOSA  Culture, blood (routine x 2)     Status: None   Collection Time: 01/04/21 11:02 AM   Specimen: BLOOD RIGHT HAND  Result Value Ref Range Status   Specimen Description BLOOD RIGHT HAND  Final   Special Requests   Final    BOTTLES DRAWN AEROBIC AND ANAEROBIC Blood Culture adequate volume   Culture   Final    NO GROWTH 5 DAYS Performed at [facility], [address], [place], [place] [address]    Report Status 01/09/2021 FINAL  Final  Culture, blood (routine x 2)     Status: Abnormal   Collection Time: 01/04/21 11:11 AM   Specimen: BLOOD RIGHT FOREARM  Result Value Ref Range Status   Specimen Description BLOOD RIGHT FOREARM  Final   Special Requests   Final    BOTTLES DRAWN AEROBIC AND ANAEROBIC Blood Culture results may not be optimal due to an inadequate volume of  blood received in culture bottles   Culture  Setup Time   Final    GRAM POSITIVE COCCI IN CLUSTERS ANAEROBIC BOTTLE ONLY CRITICAL RESULT CALLED TO, READ BACK BY AND VERIFIED WITH: RN D [place] [id number] AT 1003 BY CM    Culture (A)  Final    STAPHYLOCOCCUS EPIDERMIDIS THE SIGNIFICANCE OF ISOLATING THIS ORGANISM FROM A SINGLE SET OF BLOOD CULTURES WHEN MULTIPLE SETS ARE DRAWN IS UNCERTAIN. PLEASE NOTIFY THE MICROBIOLOGY DEPARTMENT WITHIN ONE WEEK IF SPECIATION AND SENSITIVITIES ARE REQUIRED. Performed at [facility], [address] [place], [place] [address]    Report Status 01/07/2021 FINAL  Final  Culture, blood (routine x 2)     Status: None   Collection Time: 01/09/21  5:00 PM   Specimen: BLOOD  Result Value Ref Range Status   Specimen Description BLOOD RIGHT ANTECUBITAL  Final   Special Requests   Final    BOTTLES DRAWN AEROBIC AND ANAEROBIC Blood Culture results may not be optimal due to an inadequate volume of blood received in culture bottles   Culture   Final    NO GROWTH 5 DAYS Performed at [facility], [address], [place], [place] [address]    Report Status 01/14/2021 FINAL  Final  Culture, blood (routine x 2)     Status: Abnormal   Collection Time: 01/09/21  5:04 PM   Specimen: BLOOD  Result Value Ref Range Status   Specimen Description BLOOD RIGHT ANTECUBITAL  Final   Special Requests   Final    BOTTLES DRAWN AEROBIC ONLY Blood Culture results may not be optimal due to an inadequate volume of blood received in culture bottles   Culture  Setup Time   Final    GRAM POSITIVE COCCI IN CLUSTERS AEROBIC BOTTLE ONLY CRITICAL VALUE NOTED.  VALUE IS CONSISTENT WITH PREVIOUSLY REPORTED AND CALLED VALUE.    Culture (A)  Final    STAPHYLOCOCCUS CAPITIS THE SIGNIFICANCE OF ISOLATING THIS ORGANISM FROM A SINGLE SET OF BLOOD CULTURES WHEN MULTIPLE SETS ARE DRAWN IS UNCERTAIN. PLEASE NOTIFY THE MICROBIOLOGY DEPARTMENT WITHIN ONE WEEK IF SPECIATION AND  SENSITIVITIES ARE REQUIRED. Performed at [facility], [address], [place], [place] [address]    Report Status 01/11/2021 FINAL  Final  Culture, Respiratory w Gram Stain     Status: None   Collection Time: 01/14/21  6:04 PM   Specimen: Tracheal Aspirate; Respiratory  Result Value Ref Range Status   Specimen Description TRACHEAL ASPIRATE  Final   Special Requests NONE  Final   Gram Stain   Final    RARE WBC PRESENT,BOTH PMN AND MONONUCLEAR FEW SQUAMOUS EPITHELIAL CELLS PRESENT FEW GRAM NEGATIVE RODS RARE GRAM POSITIVE COCCI Performed at [facility], [address] [place], [place] [address]    Culture MODERATE PSEUDOMONAS AERUGINOSA  Final   Report Status 01/18/2021 FINAL  Final   Organism ID, Bacteria PSEUDOMONAS AERUGINOSA  Final      Susceptibility   Pseudomonas aeruginosa - MIC*    CEFTAZIDIME 4 SENSITIVE Sensitive     CIPROFLOXACIN 2 INTERMEDIATE Intermediate     GENTAMICIN 4 SENSITIVE Sensitive     IMIPENEM >=16 RESISTANT Resistant     CEFEPIME 4 SENSITIVE Sensitive     * MODERATE PSEUDOMONAS AERUGINOSA  Culture, blood (routine x 2)     Status: None   Collection Time: 01/24/21  2:20 PM   Specimen: BLOOD LEFT HAND  Result Value Ref Range Status   Specimen Description BLOOD LEFT HAND  Final   Special Requests   Final    BOTTLES DRAWN AEROBIC AND ANAEROBIC Blood Culture adequate volume   Culture   Final    NO GROWTH 5 DAYS Performed at [facility], [address], [place], [place] [address]    Report Status 01/29/2021 FINAL  Final  Culture, blood (routine x 2)     Status: None   Collection Time: 01/24/21  2:24 PM   Specimen: BLOOD LEFT HAND  Result Value Ref Range Status   Specimen Description BLOOD LEFT HAND  Final   Special Requests   Final    BOTTLES DRAWN AEROBIC AND ANAEROBIC Blood Culture results may not be optimal due to an inadequate volume of blood received in culture bottles   Culture   Final    NO GROWTH 5 DAYS Performed at [facility], [address] [place], [place] [address]    Report Status 01/29/2021 FINAL  Final  Culture, Respiratory w Gram Stain     Status: None   Collection Time: 02/01/21 12:02 PM   Specimen: Tracheal Aspirate; Respiratory  Result Value Ref Range  Status   Specimen Description TRACHEAL ASPIRATE  Final   Special Requests NONE  Final   Gram Stain   Final    NO WBC SEEN FEW GRAM VARIABLE ROD Performed at [facility], [address] [place], [place] [address]    Culture   Final    ABUNDANT PSEUDOMONAS AERUGINOSA ABUNDANT KLEBSIELLA PNEUMONIAE Confirmed Extended Spectrum Beta-Lactamase Producer (ESBL).  In bloodstream infections from ESBL organisms, carbapenems are preferred over piperacillin/tazobactam. They are shown to have a lower risk of mortality.    Report Status 02/04/2021 FINAL  Final   Organism ID, Bacteria PSEUDOMONAS AERUGINOSA  Final   Organism ID, Bacteria KLEBSIELLA PNEUMONIAE  Final      Susceptibility   Klebsiella pneumoniae - MIC*    AMPICILLIN >=32 RESISTANT Resistant     CEFAZOLIN >=64 RESISTANT Resistant     CEFEPIME >=32 RESISTANT Resistant     CEFTAZIDIME >=64 RESISTANT Resistant     CEFTRIAXONE >=64 RESISTANT Resistant     CIPROFLOXACIN >=4 RESISTANT Resistant     GENTAMICIN >=16 RESISTANT Resistant     IMIPENEM <=0.25 SENSITIVE Sensitive     TRIMETH/SULFA >=320 RESISTANT Resistant     AMPICILLIN/SULBACTAM >=32 RESISTANT Resistant     PIP/TAZO >=128 RESISTANT Resistant     * ABUNDANT KLEBSIELLA PNEUMONIAE   Pseudomonas aeruginosa - MIC*    CEFTAZIDIME 4 SENSITIVE Sensitive     CIPROFLOXACIN 2 INTERMEDIATE Intermediate     GENTAMICIN 4 SENSITIVE Sensitive     IMIPENEM 2 SENSITIVE Sensitive     PIP/TAZO 16 SENSITIVE Sensitive     * ABUNDANT PSEUDOMONAS AERUGINOSA  Culture, blood (routine x 2)     Status: Abnormal   Collection Time: 02/04/21  6:10 PM   Specimen: BLOOD RIGHT HAND  Result Value Ref Range Status   Specimen Description BLOOD  RIGHT HAND  Final   Special Requests   Final    BOTTLES DRAWN AEROBIC AND ANAEROBIC Blood Culture adequate volume   Culture  Setup Time (A)  Final    GRAM VARIABLE ROD IN BOTH AEROBIC AND ANAEROBIC BOTTLES CRITICAL RESULT CALLED TO, READ BACK BY AND VERIFIED WITH: T,ROBINSON RN @1507  02/06/21 EB    Culture (A)  Final    LACTOBACILLUS CASEI Standardized susceptibility testing for this organism is not available. Performed at [facility], [address] [place], [place]    Report Status 02/08/2021 FINAL  Final  Blood Culture ID Panel (Reflexed)     Status: None   Collection Time: 02/04/21  6:10 PM  Result Value Ref Range Status   Enterococcus faecalis NOT DETECTED NOT DETECTED Final   Enterococcus Faecium NOT DETECTED NOT DETECTED Final   Listeria monocytogenes NOT DETECTED NOT DETECTED Final   Staphylococcus species NOT DETECTED NOT DETECTED Final   Staphylococcus aureus (BCID) NOT DETECTED NOT DETECTED Final   Staphylococcus epidermidis NOT DETECTED NOT DETECTED Final   Staphylococcus lugdunensis NOT DETECTED NOT DETECTED Final   Streptococcus species NOT DETECTED NOT DETECTED Final   Streptococcus agalactiae NOT DETECTED NOT DETECTED Final   Streptococcus pneumoniae NOT DETECTED NOT DETECTED Final   Streptococcus pyogenes NOT DETECTED NOT DETECTED Final   A.calcoaceticus-baumannii NOT DETECTED NOT DETECTED Final   Bacteroides fragilis NOT DETECTED NOT DETECTED Final   Enterobacterales NOT DETECTED NOT DETECTED Final   Enterobacter cloacae complex NOT DETECTED NOT DETECTED Final   Escherichia coli NOT DETECTED NOT DETECTED Final   Klebsiella aerogenes NOT DETECTED NOT DETECTED Final   Klebsiella oxytoca  NOT DETECTED NOT DETECTED Final   Klebsiella pneumoniae NOT DETECTED NOT DETECTED Final   Proteus species NOT DETECTED NOT DETECTED Final   Salmonella species NOT DETECTED NOT DETECTED Final   Serratia marcescens NOT DETECTED NOT DETECTED Final   Haemophilus  influenzae NOT DETECTED NOT DETECTED Final   Neisseria meningitidis NOT DETECTED NOT DETECTED Final   Pseudomonas aeruginosa NOT DETECTED NOT DETECTED Final   Stenotrophomonas maltophilia NOT DETECTED NOT DETECTED Final   Candida albicans NOT DETECTED NOT DETECTED Final   Candida auris NOT DETECTED NOT DETECTED Final   Candida glabrata NOT DETECTED NOT DETECTED Final   Candida krusei NOT DETECTED NOT DETECTED Final   Candida parapsilosis NOT DETECTED NOT DETECTED Final   Candida tropicalis NOT DETECTED NOT DETECTED Final   Cryptococcus neoformans/gattii NOT DETECTED NOT DETECTED Final    Comment: Performed at [facility], [address], [place], [place] [address]  Culture, blood (routine x 2)     Status: Abnormal   Collection Time: 02/04/21  6:25 PM   Specimen: BLOOD  Result Value Ref Range Status   Specimen Description BLOOD RIGHT ANTECUBITAL  Final   Special Requests   Final    BOTTLES DRAWN AEROBIC AND ANAEROBIC Blood Culture adequate volume   Culture  Setup Time (A)  Final    GRAM VARIABLE ROD IN BOTH AEROBIC AND ANAEROBIC BOTTLES    Culture (A)  Final    LACTOBACILLUS CASEI Standardized susceptibility testing for this organism is not available. Performed at [facility], [address] [place], [place] [address]    Report Status 02/08/2021 FINAL  Final  Culture, blood (routine x 2)     Status: Abnormal   Collection Time: 02/08/21  3:43 PM   Specimen: BLOOD  Result Value Ref Range Status   Specimen Description BLOOD BLOOD RIGHT HAND  Final   Special Requests   Final    BOTTLES DRAWN AEROBIC AND ANAEROBIC Blood Culture adequate volume   Culture  Setup Time   Final    GRAM POSITIVE RODS IN BOTH AEROBIC AND ANAEROBIC BOTTLES CRITICAL RESULT CALLED TO, READ BACK BY AND VERIFIED WITH: C NESKEO,RN@0622  02/11/21 MKELLY    Culture (A)  Final    LACTOBACILLUS SPECIES Standardized susceptibility testing for this organism is not available. Performed at [facility], [address] [place], [place] [address]    Report Status 02/13/2021 FINAL  Final  Culture, blood (routine x 2)     Status: Abnormal   Collection Time: 02/08/21  3:43 PM   Specimen: BLOOD LEFT HAND  Result Value Ref Range Status   Specimen Description BLOOD LEFT HAND  Final   Special Requests   Final    BOTTLES DRAWN AEROBIC AND ANAEROBIC Blood Culture adequate volume   Culture  Setup Time (A)  Final    GRAM VARIABLE ROD IN BOTH AEROBIC AND ANAEROBIC BOTTLES CRITICAL VALUE NOTED.  VALUE IS CONSISTENT WITH PREVIOUSLY REPORTED AND CALLED VALUE.    Culture (A)  Final    LACTOBACILLUS SPECIES Standardized susceptibility testing for this organism is not available. Performed at [facility], [address], [place], [place] [address]    Report Status 02/13/2021 FINAL  Final    Coagulation Studies: No results for input(s): LABPROT, INR in the last 72 hours.  Urinalysis: No results for input(s): COLORURINE, LABSPEC, PHURINE, GLUCOSEU, HGBUR, BILIRUBINUR, KETONESUR, PROTEINUR, UROBILINOGEN, NITRITE, LEUKOCYTESUR in the last 72 hours.  Invalid input(s): APPERANCEUR  Imaging: No results found.   Medications:       Assessment/ Plan:  65 y.o. male with a PMHx of ESRD on HD TTS, anemia of chronic kidney disease, hypertension, acute respiratory failure status post tracheostomy placement, seizure disorder, hyperlipidemia, diabetes mellitus type 2, history of hyperkalemia, history of coronary artery disease, peripheral vascular disease who was admitted to [facility] 11/21/2020 for ongoing treatment of acute respiratory failure status post tracheostomy placement and ESRD.   1.  ESRD on HD.   Patient due for dialysis treatment today and will remain on MWF schedule.  2.  Acute respiratory failure.  Tracheostomy remains capped and patient appears to be breathing comfortably.  3.  Anemia of chronic kidney disease.  Hemoglobin had dropped as low as 6.4 recently  and patient received blood transfusion.  Hemoglobin down to 7 from 8.3 yesterday.  4.  Secondary hyperparathyroidism.  Patient received significant phosphorus repletion yesterday and phosphorus is coming down and currently 7.3.  5.  Fever/sepsis.  Lactobacillus noted in the blood.  Probiotic was stopped.   LOS: 0 Joel Hebert 8/8/20228:13 AM

## 2021-02-15 LAB — TYPE AND SCREEN
ABO/RH(D): O POS
Antibody Screen: NEGATIVE
Unit division: 0

## 2021-02-15 LAB — BPAM RBC
Blood Product Expiration Date: 202209102359
ISSUE DATE / TIME: 202208081825
Unit Type and Rh: 5100

## 2021-02-16 LAB — CBC
HCT: 24.2 % — ABNORMAL LOW (ref 39.0–52.0)
Hemoglobin: 7.8 g/dL — ABNORMAL LOW (ref 13.0–17.0)
MCH: 28.9 pg (ref 26.0–34.0)
MCHC: 32.2 g/dL (ref 30.0–36.0)
MCV: 89.6 fL (ref 80.0–100.0)
Platelets: 555 10*3/uL — ABNORMAL HIGH (ref 150–400)
RBC: 2.7 MIL/uL — ABNORMAL LOW (ref 4.22–5.81)
RDW: 16.3 % — ABNORMAL HIGH (ref 11.5–15.5)
WBC: 18.3 10*3/uL — ABNORMAL HIGH (ref 4.0–10.5)
nRBC: 0 % (ref 0.0–0.2)

## 2021-02-16 LAB — VANCOMYCIN, TROUGH: Vancomycin Tr: 22 ug/mL (ref 15–20)

## 2021-02-16 LAB — RENAL FUNCTION PANEL
Albumin: 1.7 g/dL — ABNORMAL LOW (ref 3.5–5.0)
Anion gap: 15 (ref 5–15)
BUN: 71 mg/dL — ABNORMAL HIGH (ref 8–23)
CO2: 24 mmol/L (ref 22–32)
Calcium: 9.4 mg/dL (ref 8.9–10.3)
Chloride: 96 mmol/L — ABNORMAL LOW (ref 98–111)
Creatinine, Ser: 3.86 mg/dL — ABNORMAL HIGH (ref 0.61–1.24)
GFR, Estimated: 16 mL/min — ABNORMAL LOW (ref >60)
Glucose, Bld: 118 mg/dL — ABNORMAL HIGH (ref 70–99)
Phosphorus: 5.2 mg/dL — ABNORMAL HIGH (ref 2.5–4.6)
Potassium: 4.6 mmol/L (ref 3.5–5.1)
Sodium: 135 mmol/L (ref 135–145)

## 2021-02-16 LAB — OCCULT BLOOD X 1 CARD TO LAB, STOOL
Fecal Occult Bld: POSITIVE — AB
Fecal Occult Bld: POSITIVE — AB

## 2021-02-16 NOTE — Progress Notes (Signed)
[name]  ROUNDING NOTE   Subjective:  Patient to undergo hemodialysis treatment later today. Sitting up in chair at the moment.   Objective:  Vital signs in last 24 hours:  Temperature nine 9.2 pulse 94 respirations 20 blood pressure 122/56  Physical Exam: General:  No acute distress  Head:  Normocephalic, atraumatic. 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  trace right lower extremity edema  Neurologic:  myoclonic facial twitching noted  Skin:  No acute rash  Access:  Right IJ temporary dialysis catheter    Basic Metabolic Panel: Recent Labs  Lab 02/11/21 0525 02/14/21 0618 02/16/21 0511  NA 139 135 135  K 3.9 4.8 4.6  CL 97* 94* 96*  CO2 22 23 24   GLUCOSE 142* 96 118*  BUN 66* 79* 71*  CREATININE 3.55* 4.44* 3.86*  CALCIUM 8.3* 9.2 9.4  PHOS 10.7* 7.3* 5.2*     Liver Function Tests: Recent Labs  Lab 02/11/21 0525 02/14/21 0618 02/16/21 0511  ALBUMIN 1.6* 1.6* 1.7*    No results for input(s): LIPASE, AMYLASE in the last 168 hours. No results for input(s): AMMONIA in the last 168 hours.  CBC: Recent Labs  Lab 02/11/21 0525 02/11/21 1941 02/14/21 0618 02/14/21 2009 02/16/21 0511  WBC 18.4*  --  17.6*  --  18.3*  HGB 6.4* 8.3* 7.0* 8.6* 7.8*  HCT 18.8* 23.9* 21.2* 25.8* 24.2*  MCV 87.0  --  88.7  --  89.6  PLT 415*  --  487*  --  555*     Cardiac Enzymes: No results for input(s): CKTOTAL, CKMB, CKMBINDEX, TROPONINI in the last 168 hours.  BNP: Invalid input(s): POCBNP  CBG: No results for input(s): GLUCAP in the last 168 hours.  Microbiology: Results for orders placed or performed during the hospital encounter of 11/21/20  Culture, blood (routine x 2)     Status: None   Collection Time: 11/25/20  3:25 PM   Specimen: BLOOD RIGHT HAND  Result  Value Ref Range Status   Specimen Description BLOOD RIGHT HAND  Final   Special Requests   Final    BOTTLES DRAWN AEROBIC ONLY Blood Culture results may not be optimal due to an inadequate volume of blood received in culture bottles   Culture   Final    NO GROWTH 5 DAYS Performed at [facility], [address] [place], [place] [address]    Report Status 11/30/2020 FINAL  Final  Culture, blood (routine x 2)     Status: None   Collection Time: 11/25/20  3:29 PM   Specimen: BLOOD LEFT HAND  Result Value Ref Range Status   Specimen Description BLOOD LEFT HAND  Final   Special Requests   Final    BOTTLES DRAWN AEROBIC AND ANAEROBIC Blood Culture adequate volume   Culture   Final    NO GROWTH 5 DAYS Performed at [facility], [address] [place], [place] [address]    Report Status 11/30/2020 FINAL  Final  Culture, Respiratory w Gram Stain     Status: None   Collection Time: 12/06/20 10:40 AM   Specimen: Tracheal Aspirate; Respiratory  Result Value Ref Range Status   Specimen Description TRACHEAL ASPIRATE  Final   Special Requests NONE  Final   Gram  Stain   Final    ABUNDANT WBC PRESENT, PREDOMINANTLY PMN ABUNDANT GRAM NEGATIVE RODS FEW GRAM POSITIVE COCCI FEW GRAM POSITIVE RODS    Culture   Final    MODERATE PSEUDOMONAS AERUGINOSA Two isolates with different morphologies were identified as the same organism.The most resistant organism was reported. Performed at [facility], [address] [place], [place] [address]    Report Status 12/09/2020 FINAL  Final   Organism ID, Bacteria PSEUDOMONAS AERUGINOSA  Final      Susceptibility   Pseudomonas aeruginosa - MIC*    CEFTAZIDIME 16 INTERMEDIATE Intermediate     CIPROFLOXACIN 1 SENSITIVE Sensitive     GENTAMICIN 2 SENSITIVE Sensitive     IMIPENEM >=16 RESISTANT Resistant     * MODERATE PSEUDOMONAS AERUGINOSA  Culture, blood (routine x 2)     Status: None   Collection Time: 12/13/20  9:09 AM   Specimen:  BLOOD LEFT HAND  Result Value Ref Range Status   Specimen Description BLOOD LEFT HAND  Final   Special Requests   Final    BOTTLES DRAWN AEROBIC AND ANAEROBIC Blood Culture results may not be optimal due to an inadequate volume of blood received in culture bottles   Culture   Final    NO GROWTH 5 DAYS Performed at [facility], [address] [place], [place] [address]    Report Status 12/18/2020 FINAL  Final  Culture, blood (routine x 2)     Status: Abnormal   Collection Time: 12/13/20  9:13 AM   Specimen: BLOOD  Result Value Ref Range Status   Specimen Description BLOOD RIGHT ANTECUBITAL  Final   Special Requests   Final    BOTTLES DRAWN AEROBIC AND ANAEROBIC Blood Culture adequate volume   Culture  Setup Time   Final    GRAM POSITIVE COCCI IN CLUSTERS AEROBIC BOTTLE ONLY CRITICAL RESULT CALLED TO, READ BACK BY AND VERIFIED WITH: RN C.ROBENSON AT 1234 ON 12/14/2020 BY T.SAAD.    Culture (A)  Final    STAPHYLOCOCCUS EPIDERMIDIS THE SIGNIFICANCE OF ISOLATING THIS ORGANISM FROM A SINGLE SET OF BLOOD CULTURES WHEN MULTIPLE SETS ARE DRAWN IS UNCERTAIN. PLEASE NOTIFY THE MICROBIOLOGY DEPARTMENT WITHIN ONE WEEK IF SPECIATION AND SENSITIVITIES ARE REQUIRED. Performed at [facility], [address], [place], [place] [address]    Report Status 12/16/2020 FINAL  Final  Blood Culture ID Panel (Reflexed)     Status: Abnormal   Collection Time: 12/13/20  9:13 AM  Result Value Ref Range Status   Enterococcus faecalis NOT DETECTED NOT DETECTED Final   Enterococcus Faecium NOT DETECTED NOT DETECTED Final   Listeria monocytogenes NOT DETECTED NOT DETECTED Final   Staphylococcus species DETECTED (A) NOT DETECTED Final    Comment: CRITICAL RESULT CALLED TO, READ BACK BY AND VERIFIED WITH: RN C.ROBERTSON AT 1234 ON 12/14/2020 BY T.SAAD.    Staphylococcus aureus (BCID) NOT DETECTED NOT DETECTED Final   Staphylococcus epidermidis DETECTED (A) NOT DETECTED Final    Comment:  Methicillin (oxacillin) resistant coagulase negative staphylococcus. Possible blood culture contaminant (unless isolated from more than one blood culture draw or clinical case suggests pathogenicity). No antibiotic treatment is indicated for blood  culture contaminants. CRITICAL RESULT CALLED TO, READ BACK BY AND VERIFIED WITH: RN C.ROBERTSON AT 1234 ON 12/14/2020 BY T.SAAD.    Staphylococcus lugdunensis NOT DETECTED NOT DETECTED Final   Streptococcus species NOT DETECTED NOT DETECTED Final   Streptococcus agalactiae NOT DETECTED NOT DETECTED Final  Streptococcus pneumoniae NOT DETECTED NOT DETECTED Final   Streptococcus pyogenes NOT DETECTED NOT DETECTED Final   A.calcoaceticus-baumannii NOT DETECTED NOT DETECTED Final   Bacteroides fragilis NOT DETECTED NOT DETECTED Final   Enterobacterales NOT DETECTED NOT DETECTED Final   Enterobacter cloacae complex NOT DETECTED NOT DETECTED Final   Escherichia coli NOT DETECTED NOT DETECTED Final   Klebsiella aerogenes NOT DETECTED NOT DETECTED Final   Klebsiella oxytoca NOT DETECTED NOT DETECTED Final   Klebsiella pneumoniae NOT DETECTED NOT DETECTED Final   Proteus species NOT DETECTED NOT DETECTED Final   Salmonella species NOT DETECTED NOT DETECTED Final   Serratia marcescens NOT DETECTED NOT DETECTED Final   Haemophilus influenzae NOT DETECTED NOT DETECTED Final   Neisseria meningitidis NOT DETECTED NOT DETECTED Final   Pseudomonas aeruginosa NOT DETECTED NOT DETECTED Final   Stenotrophomonas maltophilia NOT DETECTED NOT DETECTED Final   Candida albicans NOT DETECTED NOT DETECTED Final   Candida auris NOT DETECTED NOT DETECTED Final   Candida glabrata NOT DETECTED NOT DETECTED Final   Candida krusei NOT DETECTED NOT DETECTED Final   Candida parapsilosis NOT DETECTED NOT DETECTED Final   Candida tropicalis NOT DETECTED NOT DETECTED Final   Cryptococcus neoformans/gattii NOT DETECTED NOT DETECTED Final   Methicillin resistance mecA/C  DETECTED (A) NOT DETECTED Final    Comment: CRITICAL RESULT CALLED TO, READ BACK BY AND VERIFIED WITH: RN C.ROBERTSON AT 1234 ON 12/14/2020 BY T.SAAD. Performed at [facility], [address] [place], [place] [address]   Culture, blood (routine x 2)     Status: None   Collection Time: 12/17/20 11:31 AM   Specimen: BLOOD  Result Value Ref Range Status   Specimen Description BLOOD RIGHT ANTECUBITAL  Final   Special Requests   Final    BOTTLES DRAWN AEROBIC ONLY Blood Culture results may not be optimal due to an inadequate volume of blood received in culture bottles   Culture   Final    NO GROWTH 5 DAYS Performed at [facility], [address] [place], [place] [address]    Report Status 12/22/2020 FINAL  Final  Culture, blood (routine x 2)     Status: None   Collection Time: 12/17/20 11:38 AM   Specimen: BLOOD LEFT HAND  Result Value Ref Range Status   Specimen Description BLOOD LEFT HAND  Final   Special Requests   Final    BOTTLES DRAWN AEROBIC ONLY Blood Culture adequate volume   Culture   Final    NO GROWTH 5 DAYS Performed at [facility], [address], [place], [place] [address]    Report Status 12/22/2020 FINAL  Final  Culture, blood (routine x 2)     Status: None   Collection Time: 12/29/20  8:10 AM   Specimen: BLOOD RIGHT HAND  Result Value Ref Range Status   Specimen Description BLOOD RIGHT HAND  Final   Special Requests   Final    BOTTLES DRAWN AEROBIC AND ANAEROBIC Blood Culture results may not be optimal due to an inadequate volume of blood received in culture bottles   Culture   Final    NO GROWTH 5 DAYS Performed at [facility], [address], [place], [place] [address]    Report Status 01/03/2021 FINAL  Final  Culture, blood (routine x 2)     Status: None   Collection Time: 12/29/20  8:17 AM   Specimen: BLOOD RIGHT HAND  Result Value Ref Range Status  Specimen Description BLOOD RIGHT HAND  Final   Special Requests   Final     BOTTLES DRAWN AEROBIC ONLY Blood Culture results may not be optimal due to an inadequate volume of blood received in culture bottles   Culture   Final    NO GROWTH 5 DAYS Performed at [facility], [address] [place], [place] [address]    Report Status 01/03/2021 FINAL  Final  Culture, Respiratory w Gram Stain     Status: None   Collection Time: 01/01/21 11:54 AM   Specimen: Tracheal Aspirate; Respiratory  Result Value Ref Range Status   Specimen Description TRACHEAL ASPIRATE  Final   Special Requests NONE  Final   Gram Stain   Final    RARE WBC PRESENT,BOTH PMN AND MONONUCLEAR FEW GRAM NEGATIVE RODS Performed at [facility], [address], [place], [place] [address]    Culture MODERATE PSEUDOMONAS AERUGINOSA  Final   Report Status 01/03/2021 FINAL  Final   Organism ID, Bacteria PSEUDOMONAS AERUGINOSA  Final      Susceptibility   Pseudomonas aeruginosa - MIC*    CEFTAZIDIME 4 SENSITIVE Sensitive     CIPROFLOXACIN 1 SENSITIVE Sensitive     GENTAMICIN <=1 SENSITIVE Sensitive     IMIPENEM 2 SENSITIVE Sensitive     PIP/TAZO <=4 SENSITIVE Sensitive     CEFEPIME 2 SENSITIVE Sensitive     * MODERATE PSEUDOMONAS AERUGINOSA  Culture, blood (routine x 2)     Status: None   Collection Time: 01/04/21 11:02 AM   Specimen: BLOOD RIGHT HAND  Result Value Ref Range Status   Specimen Description BLOOD RIGHT HAND  Final   Special Requests   Final    BOTTLES DRAWN AEROBIC AND ANAEROBIC Blood Culture adequate volume   Culture   Final    NO GROWTH 5 DAYS Performed at [facility], [address] [place], [place] [address]    Report Status 01/09/2021 FINAL  Final  Culture, blood (routine x 2)     Status: Abnormal   Collection Time: 01/04/21 11:11 AM   Specimen: BLOOD RIGHT FOREARM  Result Value Ref Range Status   Specimen Description BLOOD RIGHT FOREARM  Final   Special Requests   Final    BOTTLES DRAWN AEROBIC AND ANAEROBIC Blood Culture results may not be optimal  due to an inadequate volume of blood received in culture bottles   Culture  Setup Time   Final    GRAM POSITIVE COCCI IN CLUSTERS ANAEROBIC BOTTLE ONLY CRITICAL RESULT CALLED TO, READ BACK BY AND VERIFIED WITH: RN D [place] [id number] AT 1003 BY CM    Culture (A)  Final    STAPHYLOCOCCUS EPIDERMIDIS THE SIGNIFICANCE OF ISOLATING THIS ORGANISM FROM A SINGLE SET OF BLOOD CULTURES WHEN MULTIPLE SETS ARE DRAWN IS UNCERTAIN. PLEASE NOTIFY THE MICROBIOLOGY DEPARTMENT WITHIN ONE WEEK IF SPECIATION AND SENSITIVITIES ARE REQUIRED. Performed at [facility], [address] [place], [place] [address]    Report Status 01/07/2021 FINAL  Final  Culture, blood (routine x 2)     Status: None   Collection Time: 01/09/21  5:00 PM   Specimen: BLOOD  Result Value Ref Range Status   Specimen Description BLOOD RIGHT ANTECUBITAL  Final   Special Requests   Final    BOTTLES DRAWN AEROBIC AND ANAEROBIC Blood Culture results may not be optimal due to an inadequate volume of blood received in culture bottles   Culture   Final  NO GROWTH 5 DAYS Performed at [facility], [address] [place], [place] [address]    Report Status 01/14/2021 FINAL  Final  Culture, blood (routine x 2)     Status: Abnormal   Collection Time: 01/09/21  5:04 PM   Specimen: BLOOD  Result Value Ref Range Status   Specimen Description BLOOD RIGHT ANTECUBITAL  Final   Special Requests   Final    BOTTLES DRAWN AEROBIC ONLY Blood Culture results may not be optimal due to an inadequate volume of blood received in culture bottles   Culture  Setup Time   Final    GRAM POSITIVE COCCI IN CLUSTERS AEROBIC BOTTLE ONLY CRITICAL VALUE NOTED.  VALUE IS CONSISTENT WITH PREVIOUSLY REPORTED AND CALLED VALUE.    Culture (A)  Final    STAPHYLOCOCCUS CAPITIS THE SIGNIFICANCE OF ISOLATING THIS ORGANISM FROM A SINGLE SET OF BLOOD CULTURES WHEN MULTIPLE SETS ARE DRAWN IS UNCERTAIN. PLEASE NOTIFY THE MICROBIOLOGY DEPARTMENT WITHIN ONE  WEEK IF SPECIATION AND SENSITIVITIES ARE REQUIRED. Performed at [facility], [address], [place], [place] [address]    Report Status 01/11/2021 FINAL  Final  Culture, Respiratory w Gram Stain     Status: None   Collection Time: 01/14/21  6:04 PM   Specimen: Tracheal Aspirate; Respiratory  Result Value Ref Range Status   Specimen Description TRACHEAL ASPIRATE  Final   Special Requests NONE  Final   Gram Stain   Final    RARE WBC PRESENT,BOTH PMN AND MONONUCLEAR FEW SQUAMOUS EPITHELIAL CELLS PRESENT FEW GRAM NEGATIVE RODS RARE GRAM POSITIVE COCCI Performed at [facility], [address] [place], [place] [address]    Culture MODERATE PSEUDOMONAS AERUGINOSA  Final   Report Status 01/18/2021 FINAL  Final   Organism ID, Bacteria PSEUDOMONAS AERUGINOSA  Final      Susceptibility   Pseudomonas aeruginosa - MIC*    CEFTAZIDIME 4 SENSITIVE Sensitive     CIPROFLOXACIN 2 INTERMEDIATE Intermediate     GENTAMICIN 4 SENSITIVE Sensitive     IMIPENEM >=16 RESISTANT Resistant     CEFEPIME 4 SENSITIVE Sensitive     * MODERATE PSEUDOMONAS AERUGINOSA  Culture, blood (routine x 2)     Status: None   Collection Time: 01/24/21  2:20 PM   Specimen: BLOOD LEFT HAND  Result Value Ref Range Status   Specimen Description BLOOD LEFT HAND  Final   Special Requests   Final    BOTTLES DRAWN AEROBIC AND ANAEROBIC Blood Culture adequate volume   Culture   Final    NO GROWTH 5 DAYS Performed at [facility], [address], [place], [place] [address]    Report Status 01/29/2021 FINAL  Final  Culture, blood (routine x 2)     Status: None   Collection Time: 01/24/21  2:24 PM   Specimen: BLOOD LEFT HAND  Result Value Ref Range Status   Specimen Description BLOOD LEFT HAND  Final   Special Requests   Final    BOTTLES DRAWN AEROBIC AND ANAEROBIC Blood Culture results may not be optimal due to an inadequate volume of blood received in culture bottles   Culture   Final    NO GROWTH 5  DAYS Performed at [facility], [address] [place], [place] [address]    Report Status 01/29/2021 FINAL  Final  Culture, Respiratory w Gram Stain     Status: None   Collection Time: 02/01/21 12:02 PM   Specimen:  Tracheal Aspirate; Respiratory  Result Value Ref Range Status   Specimen Description TRACHEAL ASPIRATE  Final   Special Requests NONE  Final   Gram Stain   Final    NO WBC SEEN FEW GRAM VARIABLE ROD Performed at [facility], [address] [place], [place] [address]    Culture   Final    ABUNDANT PSEUDOMONAS AERUGINOSA ABUNDANT KLEBSIELLA PNEUMONIAE Confirmed Extended Spectrum Beta-Lactamase Producer (ESBL).  In bloodstream infections from ESBL organisms, carbapenems are preferred over piperacillin/tazobactam. They are shown to have a lower risk of mortality.    Report Status 02/04/2021 FINAL  Final   Organism ID, Bacteria PSEUDOMONAS AERUGINOSA  Final   Organism ID, Bacteria KLEBSIELLA PNEUMONIAE  Final      Susceptibility   Klebsiella pneumoniae - MIC*    AMPICILLIN >=32 RESISTANT Resistant     CEFAZOLIN >=64 RESISTANT Resistant     CEFEPIME >=32 RESISTANT Resistant     CEFTAZIDIME >=64 RESISTANT Resistant     CEFTRIAXONE >=64 RESISTANT Resistant     CIPROFLOXACIN >=4 RESISTANT Resistant     GENTAMICIN >=16 RESISTANT Resistant     IMIPENEM <=0.25 SENSITIVE Sensitive     TRIMETH/SULFA >=320 RESISTANT Resistant     AMPICILLIN/SULBACTAM >=32 RESISTANT Resistant     PIP/TAZO >=128 RESISTANT Resistant     * ABUNDANT KLEBSIELLA PNEUMONIAE   Pseudomonas aeruginosa - MIC*    CEFTAZIDIME 4 SENSITIVE Sensitive     CIPROFLOXACIN 2 INTERMEDIATE Intermediate     GENTAMICIN 4 SENSITIVE Sensitive     IMIPENEM 2 SENSITIVE Sensitive     PIP/TAZO 16 SENSITIVE Sensitive     * ABUNDANT PSEUDOMONAS AERUGINOSA  Culture, blood (routine x 2)     Status: Abnormal   Collection Time: 02/04/21  6:10 PM   Specimen: BLOOD RIGHT HAND  Result Value Ref Range Status    Specimen Description BLOOD RIGHT HAND  Final   Special Requests   Final    BOTTLES DRAWN AEROBIC AND ANAEROBIC Blood Culture adequate volume   Culture  Setup Time (A)  Final    GRAM VARIABLE ROD IN BOTH AEROBIC AND ANAEROBIC BOTTLES CRITICAL RESULT CALLED TO, READ BACK BY AND VERIFIED WITH: T,ROBINSON RN @1507  02/06/21 EB    Culture (A)  Final    LACTOBACILLUS CASEI Standardized susceptibility testing for this organism is not available. Performed at [facility], [address], [place], [place]    Report Status 02/08/2021 FINAL  Final  Blood Culture ID Panel (Reflexed)     Status: None   Collection Time: 02/04/21  6:10 PM  Result Value Ref Range Status   Enterococcus faecalis NOT DETECTED NOT DETECTED Final   Enterococcus Faecium NOT DETECTED NOT DETECTED Final   Listeria monocytogenes NOT DETECTED NOT DETECTED Final   Staphylococcus species NOT DETECTED NOT DETECTED Final   Staphylococcus aureus (BCID) NOT DETECTED NOT DETECTED Final   Staphylococcus epidermidis NOT DETECTED NOT DETECTED Final   Staphylococcus lugdunensis NOT DETECTED NOT DETECTED Final   Streptococcus species NOT DETECTED NOT DETECTED Final   Streptococcus agalactiae NOT DETECTED NOT DETECTED Final   Streptococcus pneumoniae NOT DETECTED NOT DETECTED Final   Streptococcus pyogenes NOT DETECTED NOT DETECTED Final   A.calcoaceticus-baumannii NOT DETECTED NOT DETECTED Final   Bacteroides fragilis NOT DETECTED NOT DETECTED Final   Enterobacterales NOT DETECTED NOT DETECTED Final   Enterobacter cloacae complex NOT DETECTED NOT DETECTED Final   Escherichia coli NOT DETECTED NOT DETECTED Final   Klebsiella aerogenes NOT  DETECTED NOT DETECTED Final   Klebsiella oxytoca NOT DETECTED NOT DETECTED Final   Klebsiella pneumoniae NOT DETECTED NOT DETECTED Final   Proteus species NOT DETECTED NOT DETECTED Final   Salmonella species NOT DETECTED NOT DETECTED Final   Serratia marcescens NOT DETECTED NOT  DETECTED Final   Haemophilus influenzae NOT DETECTED NOT DETECTED Final   Neisseria meningitidis NOT DETECTED NOT DETECTED Final   Pseudomonas aeruginosa NOT DETECTED NOT DETECTED Final   Stenotrophomonas maltophilia NOT DETECTED NOT DETECTED Final   Candida albicans NOT DETECTED NOT DETECTED Final   Candida auris NOT DETECTED NOT DETECTED Final   Candida glabrata NOT DETECTED NOT DETECTED Final   Candida krusei NOT DETECTED NOT DETECTED Final   Candida parapsilosis NOT DETECTED NOT DETECTED Final   Candida tropicalis NOT DETECTED NOT DETECTED Final   Cryptococcus neoformans/gattii NOT DETECTED NOT DETECTED Final    Comment: Performed at [facility], [address], [place], [place] [address]  Culture, blood (routine x 2)     Status: Abnormal   Collection Time: 02/04/21  6:25 PM   Specimen: BLOOD  Result Value Ref Range Status   Specimen Description BLOOD RIGHT ANTECUBITAL  Final   Special Requests   Final    BOTTLES DRAWN AEROBIC AND ANAEROBIC Blood Culture adequate volume   Culture  Setup Time (A)  Final    GRAM VARIABLE ROD IN BOTH AEROBIC AND ANAEROBIC BOTTLES    Culture (A)  Final    LACTOBACILLUS CASEI Standardized susceptibility testing for this organism is not available. Performed at [facility], [address], [place], [place] [address]    Report Status 02/08/2021 FINAL  Final  Culture, blood (routine x 2)     Status: Abnormal   Collection Time: 02/08/21  3:43 PM   Specimen: BLOOD  Result Value Ref Range Status   Specimen Description BLOOD BLOOD RIGHT HAND  Final   Special Requests   Final    BOTTLES DRAWN AEROBIC AND ANAEROBIC Blood Culture adequate volume   Culture  Setup Time   Final    GRAM POSITIVE RODS IN BOTH AEROBIC AND ANAEROBIC BOTTLES CRITICAL RESULT CALLED TO, READ BACK BY AND VERIFIED WITH: C NESKEO,RN@0622  02/11/21 MKELLY    Culture (A)  Final    LACTOBACILLUS SPECIES Standardized susceptibility testing for this organism is not  available. Performed at [facility], [address] [place], [place] [address]    Report Status 02/13/2021 FINAL  Final  Culture, blood (routine x 2)     Status: Abnormal   Collection Time: 02/08/21  3:43 PM   Specimen: BLOOD LEFT HAND  Result Value Ref Range Status   Specimen Description BLOOD LEFT HAND  Final   Special Requests   Final    BOTTLES DRAWN AEROBIC AND ANAEROBIC Blood Culture adequate volume   Culture  Setup Time (A)  Final    GRAM VARIABLE ROD IN BOTH AEROBIC AND ANAEROBIC BOTTLES CRITICAL VALUE NOTED.  VALUE IS CONSISTENT WITH PREVIOUSLY REPORTED AND CALLED VALUE.    Culture (A)  Final    LACTOBACILLUS SPECIES Standardized susceptibility testing for this organism is not available. Performed at [facility], [address], [place], [place] [address]    Report Status 02/13/2021 FINAL  Final    Coagulation Studies: No results for input(s): LABPROT, INR in the last 72 hours.  Urinalysis: No results for input(s): COLORURINE, LABSPEC, PHURINE, GLUCOSEU, HGBUR, BILIRUBINUR, KETONESUR, PROTEINUR, UROBILINOGEN, NITRITE, LEUKOCYTESUR in the last 72  hours.  Invalid input(s): APPERANCEUR    Imaging: No results found.   Medications:       Assessment/ Plan:  65 y.o. male with a PMHx of ESRD on HD TTS, anemia of chronic kidney disease, hypertension, acute respiratory failure status post tracheostomy placement, seizure disorder, hyperlipidemia, diabetes mellitus type 2, history of hyperkalemia, history of coronary artery disease, peripheral vascular disease who was admitted to [facility] on 11/21/2020 for ongoing treatment of acute respiratory failure status post tracheostomy placement and ESRD.   1.  ESRD on HD.   We will plan for hemodialysis treatment later today.  2.  Acute respiratory failure.  Patient continues to tolerate capping of his tracheostomy and appears to be breathing comfortably.  3.  Anemia of chronic kidney disease.  Patient  continues to require periodic blood transfusions.  Hemoglobin currently 7.8.  4.  Secondary hyperparathyroidism.  Phosphorus now down to 5.2.  Continue to monitor.  Was given phosphorus repletion earlier.  5.  Fever/sepsis.  Lactobacillus noted in the blood.  WBC count still 18.3.  Currently undergoing treatment with vancomycin.   LOS: 0 Joel Hebert 8/10/20228:17 AM

## 2021-02-17 ENCOUNTER — Other Ambulatory Visit (HOSPITAL_COMMUNITY): Payer: Medicare Other

## 2021-02-17 DIAGNOSIS — Z89512 Acquired absence of left leg below knee: Secondary | ICD-10-CM | POA: Diagnosis not present

## 2021-02-17 DIAGNOSIS — J9601 Acute respiratory failure with hypoxia: Secondary | ICD-10-CM | POA: Diagnosis not present

## 2021-02-17 DIAGNOSIS — T8781 Dehiscence of amputation stump: Secondary | ICD-10-CM | POA: Diagnosis not present

## 2021-02-17 DIAGNOSIS — Z930 Tracheostomy status: Secondary | ICD-10-CM | POA: Diagnosis not present

## 2021-02-17 DIAGNOSIS — Z431 Encounter for attention to gastrostomy: Secondary | ICD-10-CM

## 2021-02-17 MED ORDER — IOHEXOL 300 MG/ML  SOLN
100.0000 mL | Freq: Once | INTRAMUSCULAR | Status: AC | PRN
  Administered 2021-02-17: 100 mL via INTRAVENOUS

## 2021-02-17 NOTE — Progress Notes (Signed)
PROGRESS NOTE    Joel Hebert  IRC:[id number] DOB: February 05, 1956 DOA: 11/21/2020   Brief Narrative:  Joel Hebert is an 65 y.o. 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  In the emergency room patient apparently was found to be agitated and combative, not following any commands.  He was recently admitted in the nursing home after being discharged from [facility] with right foot pain on 08/22/2020 with decreased circulation and underwent procedure to open his arteries on 09/03/2020.  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  He was transferred to the nursing home on cefepime 2 g every 8 hours with stop date on 10/23/2020.  He was admitted to the outside facility for altered mental status thought to be secondary to hypertensive encephalopathy with hypertensive emergency.  He was started on Cleviprex drip.  His troponin was elevated and he was started on heparin drip.  On 10/19/2020 rapid response was called for altered mental status and desaturation.  He had to be intubated due to worsening respiratory distress.  He was seen by nephrology for worsening azotemia during his hospital stay and was started on hemodialysis.  He was found to have heme positive stool and was transfused 1 unit PRBCs on 10/23/2020.  His blood pressures remained elevated.  He was started on isosorbide dinitrate drip.  On 10/27/2020 patient apparently self extubated, was transitioned to BiPAP.  However, he had a cord of unknown downtime with return of spontaneous circulation after 5 rounds of compressions, epinephrine x2.  He was found to be  actively seizing.  He was seen by neurology and started on valproic acid, Klonopin, fosphenytoin IV, Vimpat, phenobarbital.  He also received treatment with antibiotics with ceftazidime and IV Bactrim for pneumonia.  His sputum culture was positive for Pseudomonas and Klebsiella which was Carbapenem resistant.  Patient also received treatment with inhaled tobramycin.  Due to his complex medical problems he was transferred and admitted to [facility].  Patient continuing to have fever. He has HD catheter in place.  Blood cultures from 01/04/2021 showed staph epidermidis.  He also had blood cultures from 12/13/2020 which showed staph epidermidis.  He received treatment with multiple antibiotics.  He had catheter removed.  He again had blood cultures collected on 01/09/2021 which showed Staph capitis likely contaminant.  He completed treatment with IV vancomycin, cefepime.  Respiratory cultures from 01/14/2021 showed moderate Pseudomonas aeruginosa for which he was treated with ceftazidime. He again had worsening leukocytosis with fever.  Blood cultures from 02/04/2021 and again from 02/08/2021 showed lactobacillus species in both the bottles.   Assessment & Plan:   Active Problems:   Acute on chronic respiratory failure with hypoxia (HCC)   Anoxic brain injury (HCC) Sepsis/Gram-positive bacteremia, with MRSA, lactobacillus  Acute renal failure superimposed on chronic kidney disease (HCC)   Pneumonia due to Pseudomonas (HCC)  Fever/leukocytosis  Acute renal failure superimposed on chronic kidney disease (HCC) Acute on chronic anemia Diabetic foot wound with gangrene status post recent surgery Diabetes mellitus type 2 Dysphagia/protein calorie malnutrition   Acute on chronic respiratory failure with hypoxemia: Unfortunately continues to have increased secretions.  His previous respiratory  culture showed Pseudomonas for which he received treatment with cefepime.  He had ongoing fevers and  leukocytosis. Again treated with another round of IV vancomycin, cefepime.  Respiratory cultures from 01/14/2021 again showed Pseudomonas for which he was treated with ceftazidime.  After that he had fevers again with worsening leukocytosis this time with blood cultures that showed lactobacillus species.  Therefore started on meropenem.  Vancomycin was continued.  Continue to monitor closely.  He unfortunately has dysphagia and high risk for recurrent aspiration and aspiration pneumonia despite being on antibiotics. If his respiratory status worsens suggest repeat respiratory cultures and chest imaging preferably chest CT which can be done without contrast due to the acute on chronic renal failure.   Pneumonia: He has had pneumonia with respiratory cultures have previously showed Pseudomonas.  He already received treatment with multiple rounds of antibiotics.  Repeat respiratory cultures again showed Pseudomonas. He received treatment with ceftazidime.  Now started on meropenem for the lactobacillus bacteremia.  Vancomycin was also continued. Unfortunately also has dysphagia secondary to anoxic brain injury and high risk for recurrent aspiration and aspiration pneumonia despite being on antibiotics. Again, as mentioned above he is high risk for worsening respiratory failure due to his aspiration despite being on antibiotics.  If his respiratory status worsens, suggest repeat chest imaging preferably chest CT.  Continue to monitor BUN/creatinine closely.  Adjust antibiotics accordingly.   Fever/leukocytosis: In the setting of sepsis.  He has received treatment with multiple rounds of antibiotics.  Recently treated with ceftazidime and secondary to respiratory cultures again showing Pseudomonas.  Blood cultures showed lactobacillus species. Therefore recommended to start on meropenem.  He previously had MRSA therefore IV vancomycin was continued.  His dialysis catheter was exchanged. Continue to monitor closely.  If  he is continuing to have fevers greater than 101 and worsening leukocytosis recommend to send for pancultures and repeat imaging as mentioned above.  Sepsis: Gram-positive bacteremia.  Blood cultures from 02/04/2021 and again from 02/08/2021 showed lactobacillus species.  In the setting of fever, worsening leukocytosis and the bacteremia treating with meropenem.  He is also on probiotic lactobacillus.  Recommend to stop the probiotic lactobacillus as sometimes this can lead to bacteremia and sepsis in immunocompromised patients.  His cultures previously showed MRSA therefore vancomycin was continued.  Repeat blood cultures do not show any growth.  His dialysis catheter was exchanged.    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  Unfortunately he continues to remain unresponsive.  Continue medications per primary team.   Acute on chronic kidney disease: Patient on hemodialysis.  Nephrology following.  Antibiotics renally dosed.  Further management per primary team and nephrology.   Diabetic foot infection with gangrene: He is status post right transmetatarsal amputation.  Continue local wound care.  He also has left BKA.  Ortho consulted.  He may need revision of his BKA.  If wound worsening we may need to repeat imaging.  On antibiotics as mentioned above.   Diabetes mellitus: Continue to monitor, medication and management of diabetes per primary team.   Dysphagia/protein calorie malnutrition: Management per the primary team.  Due to his dysphagia he is high risk for ongoing aspiration and recurrent aspiration pneumonia despite being on antibiotics.   Unfortunately due to his multiple complex medical problems he is very high risk for worsening and decompensation.  Plan of care discussed with the primary team.  Subjective: He unfortunately remains encephalopathic.  Having low-grade fevers.  Objective: Vitals: Temperature  1.5, pulse 112, respiratory rate 25, blood pressure 123/52, oxygen saturation 94%  Examination: Constitutional: Ill-appearing male, remains encephalopathic, not following commands Head: Atraumatic, normocephalic Eyes: PERLA ENMT: external ears and nose appear normal, Lips appears normal, moist oral mucosa  Neck: Has trach in place CVS: S1-S2 Respiratory: Coarse breath sounds, scattered rhonchi  Abdomen: Obese, soft, positive bowel sounds  Musculoskeletal: Left BKA, right TMA with dressing in place Neuro: Encephalopathic.  Unable to do neurologic exam. Psych: Unable to assess at this time skin: no rashes  Data Reviewed: I have personally reviewed following labs and imaging studies  CBC: Recent Labs  Lab 02/11/21 0525 02/11/21 1941 02/14/21 0618 02/14/21 2009 02/16/21 0511  WBC 18.4*  --  17.6*  --  18.3*  HGB 6.4* 8.3* 7.0* 8.6* 7.8*  HCT 18.8* 23.9* 21.2* 25.8* 24.2*  MCV 87.0  --  88.7  --  89.6  PLT 415*  --  487*  --  555*     Basic Metabolic Panel: Recent Labs  Lab 02/11/21 0525 02/14/21 0618 02/16/21 0511  NA 139 135 135  K 3.9 4.8 4.6  CL 97* 94* 96*  CO2 22 23 24   GLUCOSE 142* 96 118*  BUN 66* 79* 71*  CREATININE 3.55* 4.44* 3.86*  CALCIUM 8.3* 9.2 9.4  PHOS 10.7* 7.3* 5.2*     GFR: CrCl cannot be calculated (Unknown ideal weight.).  Liver Function Tests: Recent Labs  Lab 02/11/21 0525 02/14/21 0618 02/16/21 0511  ALBUMIN 1.6* 1.6* 1.7*     CBG: No results for input(s): GLUCAP in the last 168 hours.   Recent Results (from the past 240 hour(s))  Culture, blood (routine x 2)     Status: Abnormal   Collection Time: 02/08/21  3:43 PM   Specimen: BLOOD  Result Value Ref Range Status   Specimen Description BLOOD BLOOD RIGHT HAND  Final   Special Requests   Final    BOTTLES DRAWN AEROBIC AND ANAEROBIC Blood Culture adequate volume   Culture  Setup Time   Final    GRAM POSITIVE RODS IN BOTH AEROBIC AND ANAEROBIC BOTTLES CRITICAL RESULT  CALLED TO, READ BACK BY AND VERIFIED WITH: C NESKEO,RN@0622  02/11/21 MKELLY    Culture (A)  Final    LACTOBACILLUS SPECIES Standardized susceptibility testing for this organism is not available. Performed at [facility], [address], [place], [place]    Report Status 02/13/2021 FINAL  Final  Culture, blood (routine x 2)     Status: Abnormal   Collection Time: 02/08/21  3:43 PM   Specimen: BLOOD LEFT HAND  Result Value Ref Range Status   Specimen Description BLOOD LEFT HAND  Final   Special Requests   Final    BOTTLES DRAWN AEROBIC AND ANAEROBIC Blood Culture adequate volume   Culture  Setup Time (A)  Final    GRAM VARIABLE ROD IN BOTH AEROBIC AND ANAEROBIC BOTTLES CRITICAL VALUE NOTED.  VALUE IS CONSISTENT WITH PREVIOUSLY REPORTED AND CALLED VALUE.    Culture (A)  Final    LACTOBACILLUS SPECIES Standardized susceptibility testing for this organism is not available. Performed at [facility], [address] [place], [place]    Report Status 02/13/2021 FINAL  Final  Culture, blood (routine x 2)     Status: None (Preliminary result)   Collection Time: 02/16/21 12:24 PM   Specimen: BLOOD  Result Value Ref Range Status   Specimen Description BLOOD LEFT ANTECUBITAL  Final  Special Requests   Final    BOTTLES DRAWN AEROBIC AND ANAEROBIC Blood Culture adequate volume   Culture   Final    NO GROWTH < 24 HOURS Performed at [facility], [address] [place], [place] [address]    Report Status PENDING  Incomplete  Culture, blood (routine x 2)     Status: None (Preliminary result)   Collection Time: 02/16/21 12:29 PM   Specimen: BLOOD  Result Value Ref Range Status   Specimen Description BLOOD RIGHT ANTECUBITAL  Final   Special Requests   Final    BOTTLES DRAWN AEROBIC AND ANAEROBIC Blood Culture adequate volume   Culture   Final    NO GROWTH < 24 HOURS Performed at [facility], [address] [place], [place] [address]     Report Status PENDING  Incomplete      Radiology Studies: CT ABDOMEN PELVIS W CONTRAST  Result Date: 02/17/2021 CLINICAL DATA:  Sepsis, leukocytosis, heme-positive stools. History type II diabetes mellitus, coronary artery disease post MI, hypertension, acute on chronic renal failure, acute on chronic respiratory failure, smoker EXAM: CT ABDOMEN AND PELVIS WITH CONTRAST TECHNIQUE: Multidetector CT imaging of the abdomen and pelvis was performed using the standard protocol following bolus administration of intravenous contrast. Sagittal and coronal MPR images reconstructed from axial data set. CONTRAST:  100mL OMNIPAQUE IOHEXOL 300 MG/ML SOLN IV. No oral contrast. COMPARISON:  None FINDINGS: Lower chest: Bibasilar atelectasis and minimal RIGHT pleural effusion. Hepatobiliary: Gallbladder and liver normal appearance Pancreas: Normal appearance Spleen: Normal appearance Adrenals/Urinary Tract: Tiny nonobstructing calculus RIGHT kidney. Adrenal glands, kidneys, and ureters normal appearance. Diffusely thickened bladder wall. Stomach/Bowel: Normal appendix. Gastrostomy tube within stomach. Stomach and bowel loops otherwise unremarkable. No evidence of bowel obstruction or dilatation. Minimal rectal wall thickening with diffuse infiltrative changes of perirectal fat planes extending into presacral space question proctitis. Vascular/Lymphatic: Atherosclerotic calcifications aorta and iliac arteries without aneurysm. Coronary arterial calcifications noted. No adenopathy. 9 mm low-attenuation node LEFT paraspinal image 40. Reproductive: Unremarkable prostate gland and seminal vesicles Other: No free air or free fluid.  No hernia. Musculoskeletal: Decubitus ulcer identified dorsal to the distal sacrum/coccyx with a small amount of gas seen within and anterior to the coccyx as well, best appreciated on sagittal imaging. Findings are consistent with osteomyelitis. No additional osseous abnormalities. IMPRESSION: Diffuse  bladder wall thickening consistent with cystitis; recommend correlation with urinalysis. Minimal rectal wall thickening with infiltration of perirectal fat planes concerning for proctitis. Decubitus ulcer identified overlying the distal sacrum/coccyx with gas within and anterior to the coccyx consistent with osteomyelitis. Electronically Signed   By: Ulyses SouthwardMark  Boles M.D.   On: 02/17/2021 11:01    Scheduled Meds: Please see MAR   Vonzella NippleAnupama Tyreanna Bisesi, MD   02/17/2021, 5:58 PM

## 2021-02-17 NOTE — Consult Note (Signed)
ORTHOPAEDIC CONSULTATION  REQUESTING PHYSICIAN: Default, Provider, MD  Chief Complaint: Dry gangrene left below-knee amputation.  HPI: Joel Hebert is a 65 y.o. 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  Patient is seen at this time for progressive dry gangrenous changes to the left below-knee amputation.  Past Medical History:  Diagnosis Date   Acute on chronic respiratory failure with hypoxia (HCC)    Acute renal failure superimposed on chronic kidney disease (HCC)    Anoxic brain injury (HCC)    Coronary artery disease    High cholesterol    Hypertension    MI (myocardial infarction) (HCC) 08/13/2014; 01/2016- 03/2016 X 3   PAD (peripheral artery disease) (HCC)    Peripheral vascular disease (HCC)    Pneumonia due to Pseudomonas (HCC)    Type II diabetes mellitus (HCC)    Past Surgical History:  Procedure Laterality Date   ABDOMINAL AORTOGRAM W/LOWER EXTREMITY  08/14/2017   ABDOMINAL AORTOGRAM W/LOWER EXTREMITY N/A 08/14/2017   Procedure: ABDOMINAL AORTOGRAM W/LOWER EXTREMITY;  Surgeon: Nada Libman, MD;  Location: [facility];  Service: Cardiovascular;  Laterality: N/A;   ABDOMINAL AORTOGRAM W/LOWER EXTREMITY N/A 09/05/2017   Procedure: ABDOMINAL AORTOGRAM W/LOWER EXTREMITY;  Surgeon: Maeola Harman, MD;  Location: [facility] IN[facility]IVE CV [facility];  Service: Cardiovascular;  Laterality: N/A;   AMPUTATION Left 10/09/2017   Procedure: AMPUTATION DIGIT LEFT FIFTH TOE;  Surgeon: Maeola Harman, MD;  Location: [facility] OR;  Service: Vascular;  Laterality: Left;   AMPUTATION Left 12/14/2017   Procedure: LEFT TRANSMETATARSAL AMPUTATION;  Surgeon: Nadara Mustard, MD;  Location: [facility] OR;  Service: Orthopedics;  Laterality: Left;   AMPUTATION Left 01/04/2018   Procedure: LEFT BELOW KNEE AMPUTATION;  Surgeon: Nadara Mustard, MD;   Location: [facility] OR;  Service: Orthopedics;  Laterality: Left;   AMPUTATION TOE Left 10/09/2017   left foot 5th toe   CORONARY ANGIOPLASTY WITH STENT PLACEMENT  01/2016; 03/2016   "1 + 1" (08/14/2017)   IR FLUORO GUIDE CV LINE RIGHT  01/07/2018   IR FLUORO GUIDE CV LINE RIGHT  01/11/2021   IR REMOVAL TUN CV CATH W/O FL  01/07/2021   IR US GUIDE VASC ACCESS RIGHT  01/07/2018   IR US GUIDE VASC ACCESS RIGHT  01/11/2021   LOWER EXTREMITY ANGIOGRAPHY N/A 08/20/2017   Procedure: LOWER EXTREMITY ANGIOGRAPHY-rt leg;  Surgeon: Sherren Kerns, MD;  Location: [facility];  Service: Cardiovascular;  Laterality: N/A;   PERIPHERAL VASCULAR ATHERECTOMY  08/14/2017   Procedure: PERIPHERAL VASCULAR ATHERECTOMY;  Surgeon: Nada Libman, MD;  Location: [facility];  Service: Cardiovascular;;  SFA    PERIPHERAL VASCULAR ATHERECTOMY Left 09/05/2017   Procedure: PERIPHERAL VASCULAR ATHERECTOMY;  Surgeon: Maeola Harman, MD;  Location: [facility] INVASIVE CV LAB;  Service: Cardiovascular;  Laterality: Left;  Tibioperoneal and posterior tibial   PERIPHERAL VASCULAR BALLOON ANGIOPLASTY  08/14/2017   Procedure: PERIPHERAL VASCULAR BALLOON ANGIOPLASTY;  Surgeon: Nada Libman, MD;  Location: [facility];  Service: Cardiovascular;;  peroneal   PERIPHERAL VASCULAR BALLOON ANGIOPLASTY  08/20/2017   Procedure: PERIPHERAL VASCULAR BALLOON ANGIOPLASTY;  Surgeon: Sherren Kerns, MD;  Location: [facility];  Service: Cardiovascular;;   PERIPHERAL VASCULAR INTERVENTION  08/14/2017   Procedure: PERIPHERAL VASCULAR INTERVENTION;  Surgeon: Nada Libman, MD;  Location: [facility];  Service:  Cardiovascular;;  SFA stent   TONSILLECTOMY     WOUND DEBRIDEMENT Left 11/06/2017   Procedure: DEBRIDEMENT WOUND TOE BED OF FIFTH TOE LEFT FOOT;  Surgeon: Maeola Harmanain, Brandon Christopher, MD;  [facility];  Service: Vascular;  Laterality: Left;   Social History   Socioeconomic History   Marital status: Married     Spouse name: Not on file   Number of children: Not on file   Years of education: Not on file   Highest education level: Not on file  Occupational History   Not on file  Tobacco Use   Smoking status: Some Days    Packs/day: 0.25    Years: 43.00    Pack years: 10.75    Types: Cigarettes    Start date: 08/12/1974   Smokeless tobacco: Never   Tobacco comments:    2-3 cigarettes occasionally  Vaping Use   Vaping Use: Never used  Substance and Sexual Activity   Alcohol use: Not Currently    Comment: 08/14/2017 "nothing since ~ 2012"   Drug use: No   Sexual activity: Not Currently  Other Topics Concern   Not on file  Social History Narrative   Not on file   Social Determinants of Health   Financial Resource Strain: Not on file  Food Insecurity: Not on file  Transportation Needs: Not on file  Physical Activity: Not on file  Stress: Not on file  Social Connections: Not on file   Family History  Problem Relation Age of Onset   Heart failure Brother    Heart disease Brother    Kidney disease Mother    Heart disease Father    - negative except otherwise stated in the family history section No Known Allergies Prior to Admission medications   Medication Sig Start Date End Date Taking? Authorizing Provider  acetaminophen (TYLENOL) 500 MG tablet Take 500 mg by mouth every 6 (six) hours as needed for mild pain or headache.     [provider]  amLODipine (NORVASC) 10 MG tablet Take 1 tablet (10 mg total) by mouth at bedtime. 01/23/18   Arnetha CourserAmin, Sumayya, MD  aspirin EC 81 MG tablet Take 81 mg by mouth 2 (two) times daily.     [provider]  bisacodyl (DULCOLAX) 5 MG EC tablet Take 10 mg by mouth daily.    [provider]  cloNIDine (CATAPRES) 0.1 MG tablet Take 0.1 mg by mouth daily.    [provider]  clopidogrel (PLAVIX) 75 MG tablet Take 1 tablet (75 mg total) by mouth daily with breakfast. Patient taking differently: Take 75 mg by mouth daily  with breakfast. Have not taken in last month per pt 03-27-18 08/15/17   Rhyne, Ames CoupeSamantha J, PA-C  doxycycline (VIBRA-TABS) 100 MG tablet Take 1 tablet (100 mg total) by mouth 2 (two) times daily. 02/21/18   Nadara Mustarduda, Christiano Blandon V, MD  Ergocalciferol 2000 units TABS Take 2,000 Units by mouth daily.    [provider]  ferrous sulfate 325 (65 FE) MG tablet Take 1 tablet (325 mg total) by mouth daily with breakfast. 01/24/18   Arnetha CourserAmin, Sumayya, MD  gabapentin (NEURONTIN) 300 MG capsule Take 1 capsule (300 mg total) by mouth at bedtime. 01/23/18   Arnetha CourserAmin, Sumayya, MD  hydrALAZINE (APRESOLINE) 100 MG tablet Take 1 tablet (100 mg total) by mouth every 8 (eight) hours. 01/23/18   Arnetha CourserAmin, Sumayya, MD  hydrocortisone cream 1 % Apply 1 application topically 3 (three) times daily as needed for itching (minor skin  irritation). 01/23/18   Arnetha Courser, MD  insulin lispro (ADMELOG SOLOSTAR) 100 UNIT/ML KiwkPen Inject 0-10 Units into the skin 3 (three) times daily before meals. Per sliding scale: CBG 0-150 0 units, 151-200 2 units, 201-250 4 units, 251-300 6 units, 301-350 8 units 351-400 10 units, call PCP if CBG <70 or >400    [provider]  methocarbamol (ROBAXIN) 500 MG tablet Take 1 tablet (500 mg total) by mouth every 8 (eight) hours as needed for muscle spasms. 12/18/17   Nadara Mustard, MD  metoprolol tartrate (LOPRESSOR) 100 MG tablet Take 100 mg by mouth 2 (two) times daily.    [provider]  nitroGLYCERIN (NITROSTAT) 0.4 MG SL tablet Place 0.4 mg under the tongue every 5 (five) minutes as needed for chest pain.    [provider]  Nutritional Supplements (FEEDING SUPPLEMENT, NEPRO CARB STEADY,) LIQD Take 237 mLs by mouth daily. 01/23/18   Arnetha Courser, MD  oxyCODONE-acetaminophen (PERCOCET) 10-325 MG tablet Take 1 tablet by mouth every 4 (four) hours as needed for pain. Patient taking differently: Take 1 tablet by mouth every 6 (six) hours as needed for pain.  12/18/17   Nadara Mustard, MD   polyethylene glycol [facility]) packet Take 17 g by mouth daily as needed for mild constipation. 01/23/18   Arnetha Courser, MD  rosuvastatin (CRESTOR) 20 MG tablet Take 20 mg by mouth every evening.     [provider]  vitamin B-12 1000 MCG tablet Take 1 tablet (1,000 mcg total) by mouth daily. 01/24/18   Arnetha Courser, MD  Vitamin D, Ergocalciferol, (DRISDOL) 50000 units CAPS capsule Take 50,000 Units by mouth every Wednesday.    [provider]   No results found. - pertinent xrays, CT, MRI studies were reviewed and independently interpreted  Positive ROS: All other systems have been reviewed and were otherwise negative with the exception of those mentioned in the HPI and as above.  Physical Exam: General: Alert, no acute distress Psychiatric: Patient is competent for consent with normal mood and affect Lymphatic: No axillary or cervical lymphadenopathy Cardiovascular: No pedal edema Respiratory: No cyanosis, no use of accessory musculature GI: No organomegaly, abdomen is soft and non-tender    Images:  @ENCIMAGES @  Labs:  Lab Results  Component Value Date   HGBA1C 5.9 (H) 01/08/2021   HGBA1C 5.3 11/22/2020   HGBA1C 7.2 (H) 12/14/2017   REPTSTATUS 02/13/2021 FINAL 02/08/2021   REPTSTATUS 02/13/2021 FINAL 02/08/2021   GRAMSTAIN  02/01/2021    NO WBC SEEN FEW GRAM VARIABLE ROD Performed at [facility], [address], [place], [place]    CULT (A) 02/08/2021    LACTOBACILLUS SPECIES Standardized susceptibility testing for this organism is not available. Performed at [facility], [address], [place], [place]    CULT (A) 02/08/2021    LACTOBACILLUS SPECIES Standardized susceptibility testing for this organism is not available. Performed at [facility], [address], [place], [place]    LABORGA PSEUDOMONAS AERUGINOSA 02/01/2021   LABORGA KLEBSIELLA PNEUMONIAE 02/01/2021    Lab Results   Component Value Date   ALBUMIN 1.7 (L) 02/16/2021   ALBUMIN 1.6 (L) 02/14/2021   ALBUMIN 1.6 (L) 02/11/2021     CBC EXTENDED Latest Ref Rng & Units 02/16/2021 02/14/2021 02/14/2021  WBC 4.0 - 10.5 K/uL 18.3(H) - 17.6(H)  RBC 4.22 - 5.81 MIL/uL 2.70(L) - 2.39(L)  HGB 13.0 - 17.0 g/dL 7.8(L) 8.6(L) 7.0(L)  HCT 39.0 - 52.0 % 24.2(L) 25.8(L) 21.2(L)  PLT 150 - 400 K/uL 555(H) - 487(H)  NEUTROABS 1.7 - 7.7 K/uL - - -  LYMPHSABS 0.7 - 4.0 K/uL - - -    Neurologic: Patient does not have protective sensation bilateral lower extremities.   MUSCULOSKELETAL:   Skin: Examination there is dry black gangrenous changes to the residual limb on the left.  The fibula is exposed.  There is no cellulitis no drainage no abscess.  No tenderness to palpation proximally.  Review of the right foot shows a stable transmetatarsal amputation with a well-healed incision.  Patient's white cell count is 18.3.  Hemoglobin 7.8.  Albumin 1.7.  Assessment: Assessment: Acute brain injury with dry gangrenous changes to the left transtibial amputation with exposed fibula and a well-healed right transmetatarsal amputation.  Plan: Patient will need revision surgery to the left transtibial amputation.  Would plan for surgery when patient has stabilized and has sufficient resources to heal the incision.  Patient still has significant respiratory issues, nutrition issues and would not be a safe surgical candidate at this time.  Continue dry dressing changes to the left transtibial amputation as needed.  Thank you for the consult and the opportunity to see Mr. Cyree Chuong, MD [facility] [phone] 9:03 AM

## 2021-02-18 LAB — URINALYSIS, ROUTINE W REFLEX MICROSCOPIC
Bilirubin Urine: NEGATIVE
Bilirubin Urine: NEGATIVE
Glucose, UA: NEGATIVE mg/dL
Glucose, UA: NEGATIVE mg/dL
Hgb urine dipstick: NEGATIVE
Ketones, ur: NEGATIVE mg/dL
Ketones, ur: NEGATIVE mg/dL
Nitrite: NEGATIVE
Nitrite: NEGATIVE
Protein, ur: 100 mg/dL — AB
Protein, ur: 100 mg/dL — AB
Specific Gravity, Urine: 1.019 (ref 1.005–1.030)
Specific Gravity, Urine: 1.02 (ref 1.005–1.030)
pH: 6 (ref 5.0–8.0)
pH: 6.5 (ref 5.0–8.0)

## 2021-02-18 LAB — RENAL FUNCTION PANEL
Albumin: 1.7 g/dL — ABNORMAL LOW (ref 3.5–5.0)
Anion gap: 16 — ABNORMAL HIGH (ref 5–15)
BUN: 79 mg/dL — ABNORMAL HIGH (ref 8–23)
CO2: 23 mmol/L (ref 22–32)
Calcium: 9.8 mg/dL (ref 8.9–10.3)
Chloride: 91 mmol/L — ABNORMAL LOW (ref 98–111)
Creatinine, Ser: 3.58 mg/dL — ABNORMAL HIGH (ref 0.61–1.24)
GFR, Estimated: 18 mL/min — ABNORMAL LOW (ref >60)
Glucose, Bld: 118 mg/dL — ABNORMAL HIGH (ref 70–99)
Phosphorus: 4.4 mg/dL (ref 2.5–4.6)
Potassium: 5.2 mmol/L — ABNORMAL HIGH (ref 3.5–5.1)
Sodium: 130 mmol/L — ABNORMAL LOW (ref 135–145)

## 2021-02-18 LAB — CBC
HCT: 22.5 % — ABNORMAL LOW (ref 39.0–52.0)
Hemoglobin: 7.1 g/dL — ABNORMAL LOW (ref 13.0–17.0)
MCH: 29.2 pg (ref 26.0–34.0)
MCHC: 31.6 g/dL (ref 30.0–36.0)
MCV: 92.6 fL (ref 80.0–100.0)
Platelets: 544 10*3/uL — ABNORMAL HIGH (ref 150–400)
RBC: 2.43 MIL/uL — ABNORMAL LOW (ref 4.22–5.81)
RDW: 16.2 % — ABNORMAL HIGH (ref 11.5–15.5)
WBC: 20.6 10*3/uL — ABNORMAL HIGH (ref 4.0–10.5)
nRBC: 0 % (ref 0.0–0.2)

## 2021-02-18 LAB — URINALYSIS, MICROSCOPIC (REFLEX)
RBC / HPF: >50 RBC/hpf (ref 0–5)
Squamous Epithelial / HPF: NONE SEEN (ref 0–5)
WBC, UA: >50 WBC/hpf (ref 0–5)

## 2021-02-18 LAB — HEPATITIS B SURFACE ANTIGEN: Hepatitis B Surface Ag: NONREACTIVE

## 2021-02-18 LAB — LEVETIRACETAM LEVEL: Levetiracetam Lvl: 8.5 ug/mL — ABNORMAL LOW (ref 10.0–40.0)

## 2021-02-18 NOTE — Progress Notes (Signed)
[facility]  ROUNDING NOTE   Subjective:  Patient seen and evaluated this AM. Currently sitting up in chair. Still have some times of low-grade fever.   Objective:  Vital signs in last 24 hours:  Temperature 99.6 pulse 97 respirations 31 blood pressure 118/50  Physical Exam: General:  No acute distress  Head:  Normocephalic, atraumatic. 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  trace right lower extremity edema  Neurologic:  myoclonic facial twitching noted  Skin:  No acute rash  Access:  Right IJ temporary dialysis catheter    Basic Metabolic Panel: Recent Labs  Lab 02/14/21 0618 02/16/21 0511 02/18/21 0427  NA 135 135 130*  K 4.8 4.6 5.2*  CL 94* 96* 91*  CO2 23 24 23   GLUCOSE 96 118* 118*  BUN 79* 71* 79*  CREATININE 4.44* 3.86* 3.58*  CALCIUM 9.2 9.4 9.8  PHOS 7.3* 5.2* 4.4     Liver Function Tests: Recent Labs  Lab 02/14/21 0618 02/16/21 0511 02/18/21 0427  ALBUMIN 1.6* 1.7* 1.7*    No results for input(s): LIPASE, AMYLASE in the last 168 hours. No results for input(s): AMMONIA in the last 168 hours.  CBC: Recent Labs  Lab 02/11/21 1941 02/14/21 0618 02/14/21 2009 02/16/21 0511 02/18/21 0427  WBC  --  17.6*  --  18.3* 20.6*  HGB 8.3* 7.0* 8.6* 7.8* 7.1*  HCT 23.9* 21.2* 25.8* 24.2* 22.5*  MCV  --  88.7  --  89.6 92.6  PLT  --  487*  --  555* 544*     Cardiac Enzymes: No results for input(s): CKTOTAL, CKMB, CKMBINDEX, TROPONINI in the last 168 hours.  BNP: Invalid input(s): POCBNP  CBG: No results for input(s): GLUCAP in the last 168 hours.  Microbiology: Results for orders placed or performed during the hospital encounter of 11/21/20  Culture, blood (routine x 2)     Status: None   Collection Time: 11/25/20  3:25 PM   Specimen: BLOOD RIGHT  HAND  Result Value Ref Range Status   Specimen Description BLOOD RIGHT HAND  Final   Special Requests   Final    BOTTLES DRAWN AEROBIC ONLY Blood Culture results may not be optimal due to an inadequate volume of blood received in culture bottles   Culture   Final    NO GROWTH 5 DAYS Performed at [facility], [address] [place], [place]    Report Status 11/30/2020 FINAL  Final  Culture, blood (routine x 2)     Status: None   Collection Time: 11/25/20  3:29 PM   Specimen: BLOOD LEFT HAND  Result Value Ref Range Status   Specimen Description BLOOD LEFT HAND  Final   Special Requests   Final    BOTTLES DRAWN AEROBIC AND ANAEROBIC Blood Culture adequate volume   Culture   Final    NO GROWTH 5 DAYS Performed at [facility], [address] [place], [place]    Report Status 11/30/2020 FINAL  Final  Culture, Respiratory w Gram Stain     Status: None   Collection Time: 12/06/20 10:40 AM   Specimen: Tracheal Aspirate; Respiratory  Result Value Ref Range Status   Specimen Description TRACHEAL ASPIRATE  Final   Special Requests NONE  Final  Gram Stain   Final    ABUNDANT WBC PRESENT, PREDOMINANTLY PMN ABUNDANT GRAM NEGATIVE RODS FEW GRAM POSITIVE COCCI FEW GRAM POSITIVE RODS    Culture   Final    MODERATE PSEUDOMONAS AERUGINOSA Two isolates with different morphologies were identified as the same organism.The most resistant organism was reported. Performed at [facility], [address] [place], [place] [address]    Report Status 12/09/2020 FINAL  Final   Organism ID, Bacteria PSEUDOMONAS AERUGINOSA  Final      Susceptibility   Pseudomonas aeruginosa - MIC*    CEFTAZIDIME 16 INTERMEDIATE Intermediate     CIPROFLOXACIN 1 SENSITIVE Sensitive     GENTAMICIN 2 SENSITIVE Sensitive     IMIPENEM >=16 RESISTANT Resistant     * MODERATE PSEUDOMONAS AERUGINOSA  Culture, blood (routine x 2)     Status: None   Collection Time: 12/13/20  9:09 AM    Specimen: BLOOD LEFT HAND  Result Value Ref Range Status   Specimen Description BLOOD LEFT HAND  Final   Special Requests   Final    BOTTLES DRAWN AEROBIC AND ANAEROBIC Blood Culture results may not be optimal due to an inadequate volume of blood received in culture bottles   Culture   Final    NO GROWTH 5 DAYS Performed at [facility], [address] [place], [place] [address]    Report Status 12/18/2020 FINAL  Final  Culture, blood (routine x 2)     Status: Abnormal   Collection Time: 12/13/20  9:13 AM   Specimen: BLOOD  Result Value Ref Range Status   Specimen Description BLOOD RIGHT ANTECUBITAL  Final   Special Requests   Final    BOTTLES DRAWN AEROBIC AND ANAEROBIC Blood Culture adequate volume   Culture  Setup Time   Final    GRAM POSITIVE COCCI IN CLUSTERS AEROBIC BOTTLE ONLY CRITICAL RESULT CALLED TO, READ BACK BY AND VERIFIED WITH: RN C.ROBENSON AT 1234 ON 12/14/2020 BY T.SAAD.    Culture (A)  Final    STAPHYLOCOCCUS EPIDERMIDIS THE SIGNIFICANCE OF ISOLATING THIS ORGANISM FROM A SINGLE SET OF BLOOD CULTURES WHEN MULTIPLE SETS ARE DRAWN IS UNCERTAIN. PLEASE NOTIFY THE MICROBIOLOGY DEPARTMENT WITHIN ONE WEEK IF SPECIATION AND SENSITIVITIES ARE REQUIRED. Performed at [facility], [address], [place], [place] [address]    Report Status 12/16/2020 FINAL  Final  Blood Culture ID Panel (Reflexed)     Status: Abnormal   Collection Time: 12/13/20  9:13 AM  Result Value Ref Range Status   Enterococcus faecalis NOT DETECTED NOT DETECTED Final   Enterococcus Faecium NOT DETECTED NOT DETECTED Final   Listeria monocytogenes NOT DETECTED NOT DETECTED Final   Staphylococcus species DETECTED (A) NOT DETECTED Final    Comment: CRITICAL RESULT CALLED TO, READ BACK BY AND VERIFIED WITH: RN C.ROBERTSON AT 1234 ON 12/14/2020 BY T.SAAD.    Staphylococcus aureus (BCID) NOT DETECTED NOT DETECTED Final   Staphylococcus epidermidis DETECTED (A) NOT DETECTED Final     Comment: Methicillin (oxacillin) resistant coagulase negative staphylococcus. Possible blood culture contaminant (unless isolated from more than one blood culture draw or clinical case suggests pathogenicity). No antibiotic treatment is indicated for blood  culture contaminants. CRITICAL RESULT CALLED TO, READ BACK BY AND VERIFIED WITH: RN C.ROBERTSON AT 1234 ON 12/14/2020 BY T.SAAD.    Staphylococcus lugdunensis NOT DETECTED NOT DETECTED Final   Streptococcus species NOT DETECTED NOT DETECTED Final   Streptococcus agalactiae NOT DETECTED NOT DETECTED Final  Streptococcus pneumoniae NOT DETECTED NOT DETECTED Final   Streptococcus pyogenes NOT DETECTED NOT DETECTED Final   A.calcoaceticus-baumannii NOT DETECTED NOT DETECTED Final   Bacteroides fragilis NOT DETECTED NOT DETECTED Final   Enterobacterales NOT DETECTED NOT DETECTED Final   Enterobacter cloacae complex NOT DETECTED NOT DETECTED Final   Escherichia coli NOT DETECTED NOT DETECTED Final   Klebsiella aerogenes NOT DETECTED NOT DETECTED Final   Klebsiella oxytoca NOT DETECTED NOT DETECTED Final   Klebsiella pneumoniae NOT DETECTED NOT DETECTED Final   Proteus species NOT DETECTED NOT DETECTED Final   Salmonella species NOT DETECTED NOT DETECTED Final   Serratia marcescens NOT DETECTED NOT DETECTED Final   Haemophilus influenzae NOT DETECTED NOT DETECTED Final   Neisseria meningitidis NOT DETECTED NOT DETECTED Final   Pseudomonas aeruginosa NOT DETECTED NOT DETECTED Final   Stenotrophomonas maltophilia NOT DETECTED NOT DETECTED Final   Candida albicans NOT DETECTED NOT DETECTED Final   Candida auris NOT DETECTED NOT DETECTED Final   Candida glabrata NOT DETECTED NOT DETECTED Final   Candida krusei NOT DETECTED NOT DETECTED Final   Candida parapsilosis NOT DETECTED NOT DETECTED Final   Candida tropicalis NOT DETECTED NOT DETECTED Final   Cryptococcus neoformans/gattii NOT DETECTED NOT DETECTED Final   Methicillin resistance  mecA/C DETECTED (A) NOT DETECTED Final    Comment: CRITICAL RESULT CALLED TO, READ BACK BY AND VERIFIED WITH: RN C.ROBERTSON AT 1234 ON 12/14/2020 BY T.SAAD. Performed at [facility], [address], [place], [place] [address]   Culture, blood (routine x 2)     Status: None   Collection Time: 12/17/20 11:31 AM   Specimen: BLOOD  Result Value Ref Range Status   Specimen Description BLOOD RIGHT ANTECUBITAL  Final   Special Requests   Final    BOTTLES DRAWN AEROBIC ONLY Blood Culture results may not be optimal due to an inadequate volume of blood received in culture bottles   Culture   Final    NO GROWTH 5 DAYS Performed at [facility], [address], [place], [place] [address]    Report Status 12/22/2020 FINAL  Final  Culture, blood (routine x 2)     Status: None   Collection Time: 12/17/20 11:38 AM   Specimen: BLOOD LEFT HAND  Result Value Ref Range Status   Specimen Description BLOOD LEFT HAND  Final   Special Requests   Final    BOTTLES DRAWN AEROBIC ONLY Blood Culture adequate volume   Culture   Final    NO GROWTH 5 DAYS Performed at [facility], [address], [place], [place] [address]    Report Status 12/22/2020 FINAL  Final  Culture, blood (routine x 2)     Status: None   Collection Time: 12/29/20  8:10 AM   Specimen: BLOOD RIGHT HAND  Result Value Ref Range Status   Specimen Description BLOOD RIGHT HAND  Final   Special Requests   Final    BOTTLES DRAWN AEROBIC AND ANAEROBIC Blood Culture results may not be optimal due to an inadequate volume of blood received in culture bottles   Culture   Final    NO GROWTH 5 DAYS Performed at [facility], [address], [place], [place] [address]    Report Status 01/03/2021 FINAL  Final  Culture, blood (routine x 2)     Status: None   Collection Time: 12/29/20  8:17 AM   Specimen: BLOOD RIGHT HAND  Result Value Ref Range Status  Specimen Description BLOOD RIGHT HAND  Final   Special Requests    Final    BOTTLES DRAWN AEROBIC ONLY Blood Culture results may not be optimal due to an inadequate volume of blood received in culture bottles   Culture   Final    NO GROWTH 5 DAYS Performed at [facility], [address] [place], [place] [address]    Report Status 01/03/2021 FINAL  Final  Culture, Respiratory w Gram Stain     Status: None   Collection Time: 01/01/21 11:54 AM   Specimen: Tracheal Aspirate; Respiratory  Result Value Ref Range Status   Specimen Description TRACHEAL ASPIRATE  Final   Special Requests NONE  Final   Gram Stain   Final    RARE WBC PRESENT,BOTH PMN AND MONONUCLEAR FEW GRAM NEGATIVE RODS Performed at [facility], [address] [place], [place] [address]    Culture MODERATE PSEUDOMONAS AERUGINOSA  Final   Report Status 01/03/2021 FINAL  Final   Organism ID, Bacteria PSEUDOMONAS AERUGINOSA  Final      Susceptibility   Pseudomonas aeruginosa - MIC*    CEFTAZIDIME 4 SENSITIVE Sensitive     CIPROFLOXACIN 1 SENSITIVE Sensitive     GENTAMICIN <=1 SENSITIVE Sensitive     IMIPENEM 2 SENSITIVE Sensitive     PIP/TAZO <=4 SENSITIVE Sensitive     CEFEPIME 2 SENSITIVE Sensitive     * MODERATE PSEUDOMONAS AERUGINOSA  Culture, blood (routine x 2)     Status: None   Collection Time: 01/04/21 11:02 AM   Specimen: BLOOD RIGHT HAND  Result Value Ref Range Status   Specimen Description BLOOD RIGHT HAND  Final   Special Requests   Final    BOTTLES DRAWN AEROBIC AND ANAEROBIC Blood Culture adequate volume   Culture   Final    NO GROWTH 5 DAYS Performed at [facility], [address], [place], [place] [address]    Report Status 01/09/2021 FINAL  Final  Culture, blood (routine x 2)     Status: Abnormal   Collection Time: 01/04/21 11:11 AM   Specimen: BLOOD RIGHT FOREARM  Result Value Ref Range Status   Specimen Description BLOOD RIGHT FOREARM  Final   Special Requests   Final    BOTTLES DRAWN AEROBIC AND ANAEROBIC Blood Culture results may not  be optimal due to an inadequate volume of blood received in culture bottles   Culture  Setup Time   Final    GRAM POSITIVE COCCI IN CLUSTERS ANAEROBIC BOTTLE ONLY CRITICAL RESULT CALLED TO, READ BACK BY AND VERIFIED WITH: RN D [place] [id number] AT 1003 BY CM    Culture (A)  Final    STAPHYLOCOCCUS EPIDERMIDIS THE SIGNIFICANCE OF ISOLATING THIS ORGANISM FROM A SINGLE SET OF BLOOD CULTURES WHEN MULTIPLE SETS ARE DRAWN IS UNCERTAIN. PLEASE NOTIFY THE MICROBIOLOGY DEPARTMENT WITHIN ONE WEEK IF SPECIATION AND SENSITIVITIES ARE REQUIRED. Performed at [facility], [address] [place], [place] [address]    Report Status 01/07/2021 FINAL  Final  Culture, blood (routine x 2)     Status: None   Collection Time: 01/09/21  5:00 PM   Specimen: BLOOD  Result Value Ref Range Status   Specimen Description BLOOD RIGHT ANTECUBITAL  Final   Special Requests   Final    BOTTLES DRAWN AEROBIC AND ANAEROBIC Blood Culture results may not be optimal due to an inadequate volume of blood received in culture bottles   Culture   Final  NO GROWTH 5 DAYS Performed at [facility], [address], [place], [place] [address]    Report Status 01/14/2021 FINAL  Final  Culture, blood (routine x 2)     Status: Abnormal   Collection Time: 01/09/21  5:04 PM   Specimen: BLOOD  Result Value Ref Range Status   Specimen Description BLOOD RIGHT ANTECUBITAL  Final   Special Requests   Final    BOTTLES DRAWN AEROBIC ONLY Blood Culture results may not be optimal due to an inadequate volume of blood received in culture bottles   Culture  Setup Time   Final    GRAM POSITIVE COCCI IN CLUSTERS AEROBIC BOTTLE ONLY CRITICAL VALUE NOTED.  VALUE IS CONSISTENT WITH PREVIOUSLY REPORTED AND CALLED VALUE.    Culture (A)  Final    STAPHYLOCOCCUS CAPITIS THE SIGNIFICANCE OF ISOLATING THIS ORGANISM FROM A SINGLE SET OF BLOOD CULTURES WHEN MULTIPLE SETS ARE DRAWN IS UNCERTAIN. PLEASE NOTIFY THE MICROBIOLOGY DEPARTMENT  WITHIN ONE WEEK IF SPECIATION AND SENSITIVITIES ARE REQUIRED. Performed at [facility], [address], [place], [place] [address]    Report Status 01/11/2021 FINAL  Final  Culture, Respiratory w Gram Stain     Status: None   Collection Time: 01/14/21  6:04 PM   Specimen: Tracheal Aspirate; Respiratory  Result Value Ref Range Status   Specimen Description TRACHEAL ASPIRATE  Final   Special Requests NONE  Final   Gram Stain   Final    RARE WBC PRESENT,BOTH PMN AND MONONUCLEAR FEW SQUAMOUS EPITHELIAL CELLS PRESENT FEW GRAM NEGATIVE RODS RARE GRAM POSITIVE COCCI Performed at [facility], [address], [place], [place] [address]    Culture MODERATE PSEUDOMONAS AERUGINOSA  Final   Report Status 01/18/2021 FINAL  Final   Organism ID, Bacteria PSEUDOMONAS AERUGINOSA  Final      Susceptibility   Pseudomonas aeruginosa - MIC*    CEFTAZIDIME 4 SENSITIVE Sensitive     CIPROFLOXACIN 2 INTERMEDIATE Intermediate     GENTAMICIN 4 SENSITIVE Sensitive     IMIPENEM >=16 RESISTANT Resistant     CEFEPIME 4 SENSITIVE Sensitive     * MODERATE PSEUDOMONAS AERUGINOSA  Culture, blood (routine x 2)     Status: None   Collection Time: 01/24/21  2:20 PM   Specimen: BLOOD LEFT HAND  Result Value Ref Range Status   Specimen Description BLOOD LEFT HAND  Final   Special Requests   Final    BOTTLES DRAWN AEROBIC AND ANAEROBIC Blood Culture adequate volume   Culture   Final    NO GROWTH 5 DAYS Performed at [facility], [address] [place], [place] [address]    Report Status 01/29/2021 FINAL  Final  Culture, blood (routine x 2)     Status: None   Collection Time: 01/24/21  2:24 PM   Specimen: BLOOD LEFT HAND  Result Value Ref Range Status   Specimen Description BLOOD LEFT HAND  Final   Special Requests   Final    BOTTLES DRAWN AEROBIC AND ANAEROBIC Blood Culture results may not be optimal due to an inadequate volume of blood received in culture bottles   Culture   Final    NO  GROWTH 5 DAYS Performed at [facility], [address], [place], [place] [address]    Report Status 01/29/2021 FINAL  Final  Culture, Respiratory w Gram Stain     Status: None   Collection Time: 02/01/21 12:02 PM   Specimen:  Tracheal Aspirate; Respiratory  Result Value Ref Range Status   Specimen Description TRACHEAL ASPIRATE  Final   Special Requests NONE  Final   Gram Stain   Final    NO WBC SEEN FEW GRAM VARIABLE ROD Performed at [facility], [address] [place], [place] [address]    Culture   Final    ABUNDANT PSEUDOMONAS AERUGINOSA ABUNDANT KLEBSIELLA PNEUMONIAE Confirmed Extended Spectrum Beta-Lactamase Producer (ESBL).  In bloodstream infections from ESBL organisms, carbapenems are preferred over piperacillin/tazobactam. They are shown to have a lower risk of mortality.    Report Status 02/04/2021 FINAL  Final   Organism ID, Bacteria PSEUDOMONAS AERUGINOSA  Final   Organism ID, Bacteria KLEBSIELLA PNEUMONIAE  Final      Susceptibility   Klebsiella pneumoniae - MIC*    AMPICILLIN >=32 RESISTANT Resistant     CEFAZOLIN >=64 RESISTANT Resistant     CEFEPIME >=32 RESISTANT Resistant     CEFTAZIDIME >=64 RESISTANT Resistant     CEFTRIAXONE >=64 RESISTANT Resistant     CIPROFLOXACIN >=4 RESISTANT Resistant     GENTAMICIN >=16 RESISTANT Resistant     IMIPENEM <=0.25 SENSITIVE Sensitive     TRIMETH/SULFA >=320 RESISTANT Resistant     AMPICILLIN/SULBACTAM >=32 RESISTANT Resistant     PIP/TAZO >=128 RESISTANT Resistant     * ABUNDANT KLEBSIELLA PNEUMONIAE   Pseudomonas aeruginosa - MIC*    CEFTAZIDIME 4 SENSITIVE Sensitive     CIPROFLOXACIN 2 INTERMEDIATE Intermediate     GENTAMICIN 4 SENSITIVE Sensitive     IMIPENEM 2 SENSITIVE Sensitive     PIP/TAZO 16 SENSITIVE Sensitive     * ABUNDANT PSEUDOMONAS AERUGINOSA  Culture, blood (routine x 2)     Status: Abnormal   Collection Time: 02/04/21  6:10 PM   Specimen: BLOOD RIGHT HAND  Result Value Ref Range  Status   Specimen Description BLOOD RIGHT HAND  Final   Special Requests   Final    BOTTLES DRAWN AEROBIC AND ANAEROBIC Blood Culture adequate volume   Culture  Setup Time (A)  Final    GRAM VARIABLE ROD IN BOTH AEROBIC AND ANAEROBIC BOTTLES CRITICAL RESULT CALLED TO, READ BACK BY AND VERIFIED WITH: T,ROBINSON RN  02/06/21 EB    Culture (A)  Final    LACTOBACILLUS CASEI Standardized susceptibility testing for this organism is not available. Performed at [facility], [address] [place], [place] [address]    Report Status 02/08/2021 FINAL  Final  Blood Culture ID Panel (Reflexed)     Status: None   Collection Time: 02/04/21  6:10 PM  Result Value Ref Range Status   Enterococcus faecalis NOT DETECTED NOT DETECTED Final   Enterococcus Faecium NOT DETECTED NOT DETECTED Final   Listeria monocytogenes NOT DETECTED NOT DETECTED Final   Staphylococcus species NOT DETECTED NOT DETECTED Final   Staphylococcus aureus (BCID) NOT DETECTED NOT DETECTED Final   Staphylococcus epidermidis NOT DETECTED NOT DETECTED Final   Staphylococcus lugdunensis NOT DETECTED NOT DETECTED Final   Streptococcus species NOT DETECTED NOT DETECTED Final   Streptococcus agalactiae NOT DETECTED NOT DETECTED Final   Streptococcus pneumoniae NOT DETECTED NOT DETECTED Final   Streptococcus pyogenes NOT DETECTED NOT DETECTED Final   A.calcoaceticus-baumannii NOT DETECTED NOT DETECTED Final   Bacteroides fragilis NOT DETECTED NOT DETECTED Final   Enterobacterales NOT DETECTED NOT DETECTED Final   Enterobacter cloacae complex NOT DETECTED NOT DETECTED Final   Escherichia coli NOT DETECTED NOT DETECTED Final   Klebsiella aerogenes NOT  DETECTED NOT DETECTED Final   Klebsiella oxytoca NOT DETECTED NOT DETECTED Final   Klebsiella pneumoniae NOT DETECTED NOT DETECTED Final   Proteus species NOT DETECTED NOT DETECTED Final   Salmonella species NOT DETECTED NOT DETECTED Final   Serratia marcescens NOT DETECTED  NOT DETECTED Final   Haemophilus influenzae NOT DETECTED NOT DETECTED Final   Neisseria meningitidis NOT DETECTED NOT DETECTED Final   Pseudomonas aeruginosa NOT DETECTED NOT DETECTED Final   Stenotrophomonas maltophilia NOT DETECTED NOT DETECTED Final   Candida albicans NOT DETECTED NOT DETECTED Final   Candida auris NOT DETECTED NOT DETECTED Final   Candida glabrata NOT DETECTED NOT DETECTED Final   Candida krusei NOT DETECTED NOT DETECTED Final   Candida parapsilosis NOT DETECTED NOT DETECTED Final   Candida tropicalis NOT DETECTED NOT DETECTED Final   Cryptococcus neoformans/gattii NOT DETECTED NOT DETECTED Final    Comment: Performed at [facility], [address], [place], [place] [address]  Culture, blood (routine x 2)     Status: Abnormal   Collection Time: 02/04/21  6:25 PM   Specimen: BLOOD  Result Value Ref Range Status   Specimen Description BLOOD RIGHT ANTECUBITAL  Final   Special Requests   Final    BOTTLES DRAWN AEROBIC AND ANAEROBIC Blood Culture adequate volume   Culture  Setup Time (A)  Final    GRAM VARIABLE ROD IN BOTH AEROBIC AND ANAEROBIC BOTTLES    Culture (A)  Final    LACTOBACILLUS CASEI Standardized susceptibility testing for this organism is not available. Performed at [facility], [address] [place], [place] [address]    Report Status 02/08/2021 FINAL  Final  Culture, blood (routine x 2)     Status: Abnormal   Collection Time: 02/08/21  3:43 PM   Specimen: BLOOD  Result Value Ref Range Status   Specimen Description BLOOD BLOOD RIGHT HAND  Final   Special Requests   Final    BOTTLES DRAWN AEROBIC AND ANAEROBIC Blood Culture adequate volume   Culture  Setup Time   Final    GRAM POSITIVE RODS IN BOTH AEROBIC AND ANAEROBIC BOTTLES CRITICAL RESULT CALLED TO, READ BACK BY AND VERIFIED WITH: C NESKEO,RN@0622  02/11/21 MKELLY    Culture (A)  Final    LACTOBACILLUS SPECIES Standardized susceptibility testing for this organism is not  available. Performed at [facility], [address], [place], [place] [address]    Report Status 02/13/2021 FINAL  Final  Culture, blood (routine x 2)     Status: Abnormal   Collection Time: 02/08/21  3:43 PM   Specimen: BLOOD LEFT HAND  Result Value Ref Range Status   Specimen Description BLOOD LEFT HAND  Final   Special Requests   Final    BOTTLES DRAWN AEROBIC AND ANAEROBIC Blood Culture adequate volume   Culture  Setup Time (A)  Final    GRAM VARIABLE ROD IN BOTH AEROBIC AND ANAEROBIC BOTTLES CRITICAL VALUE NOTED.  VALUE IS CONSISTENT WITH PREVIOUSLY REPORTED AND CALLED VALUE.    Culture (A)  Final    LACTOBACILLUS SPECIES Standardized susceptibility testing for this organism is not available. Performed at [facility], [address] [place], [place] [address]    Report Status 02/13/2021 FINAL  Final  Culture, blood (routine x 2)     Status: None (Preliminary result)   Collection Time: 02/16/21 12:24 PM   Specimen: BLOOD  Result Value Ref Range Status   Specimen Description BLOOD LEFT  ANTECUBITAL  Final   Special Requests   Final    BOTTLES DRAWN AEROBIC AND ANAEROBIC Blood Culture adequate volume   Culture   Final    NO GROWTH < 24 HOURS Performed at [facility], [address] [place], [place] [address]    Report Status PENDING  Incomplete  Culture, blood (routine x 2)     Status: None (Preliminary result)   Collection Time: 02/16/21 12:29 PM   Specimen: BLOOD  Result Value Ref Range Status   Specimen Description BLOOD RIGHT ANTECUBITAL  Final   Special Requests   Final    BOTTLES DRAWN AEROBIC AND ANAEROBIC Blood Culture adequate volume   Culture   Final    NO GROWTH < 24 HOURS Performed at [facility], [address] [place], [place] [address]    Report Status PENDING  Incomplete    Coagulation Studies: No results for input(s): LABPROT, INR in the last 72 hours.  Urinalysis: No results for input(s): COLORURINE, LABSPEC,  PHURINE, GLUCOSEU, HGBUR, BILIRUBINUR, KETONESUR, PROTEINUR, UROBILINOGEN, NITRITE, LEUKOCYTESUR in the last 72 hours.  Invalid input(s): APPERANCEUR    Imaging: CT ABDOMEN PELVIS W CONTRAST  Result Date: 02/17/2021 CLINICAL DATA:  Sepsis, leukocytosis, heme-positive stools. History type II diabetes mellitus, coronary artery disease post MI, hypertension, acute on chronic renal failure, acute on chronic respiratory failure, smoker EXAM: CT ABDOMEN AND PELVIS WITH CONTRAST TECHNIQUE: Multidetector CT imaging of the abdomen and pelvis was performed using the standard protocol following bolus administration of intravenous contrast. Sagittal and coronal MPR images reconstructed from axial data set. CONTRAST:  100mL OMNIPAQUE IOHEXOL 300 MG/ML SOLN IV. No oral contrast. COMPARISON:  None FINDINGS: Lower chest: Bibasilar atelectasis and minimal RIGHT pleural effusion. Hepatobiliary: Gallbladder and liver normal appearance Pancreas: Normal appearance Spleen: Normal appearance Adrenals/Urinary Tract: Tiny nonobstructing calculus RIGHT kidney. Adrenal glands, kidneys, and ureters normal appearance. Diffusely thickened bladder wall. Stomach/Bowel: Normal appendix. Gastrostomy tube within stomach. Stomach and bowel loops otherwise unremarkable. No evidence of bowel obstruction or dilatation. Minimal rectal wall thickening with diffuse infiltrative changes of perirectal fat planes extending into presacral space question proctitis. Vascular/Lymphatic: Atherosclerotic calcifications aorta and iliac arteries without aneurysm. Coronary arterial calcifications noted. No adenopathy. 9 mm low-attenuation node LEFT paraspinal image 40. Reproductive: Unremarkable prostate gland and seminal vesicles Other: No free air or free fluid.  No hernia. Musculoskeletal: Decubitus ulcer identified dorsal to the distal sacrum/coccyx with a small amount of gas seen within and anterior to the coccyx as well, best appreciated on sagittal  imaging. Findings are consistent with osteomyelitis. No additional osseous abnormalities. IMPRESSION: Diffuse bladder wall thickening consistent with cystitis; recommend correlation with urinalysis. Minimal rectal wall thickening with infiltration of perirectal fat planes concerning for proctitis. Decubitus ulcer identified overlying the distal sacrum/coccyx with gas within and anterior to the coccyx consistent with osteomyelitis. Electronically Signed   By: Ulyses SouthwardMark  Boles M.D.   On: 02/17/2021 11:01     Medications:       Assessment/ Plan:  65 y.o. male with a PMHx of ESRD on HD TTS, anemia of chronic kidney disease, hypertension, acute respiratory failure status post tracheostomy placement, seizure disorder, hyperlipidemia, diabetes mellitus type 2, history of hyperkalemia, history of coronary artery disease, peripheral vascular disease who was admitted to [facility] on 11/21/2020 for ongoing treatment of acute respiratory failure status post tracheostomy placement and ESRD.   1.  ESRD on HD.   Patient due for hemodialysis treatment today.  Continue dialysis on MWF  schedule.  2.  Acute respiratory failure.  The patient's tracheostomy remains capped which he appears to be tolerating well.  3.  Anemia of chronic kidney disease.  Hemoglobin is drifted down to 7.1.  He is required multiple blood transfusions.  Continue to monitor CBC closely.  4.  Secondary hyperparathyroidism.  Phosphorus remains in acceptable range at 4.4.  Continue to monitor.  5.  Fever/sepsis.  Lactobacillus noted in the blood.  WBC count elevated at 20.6.  Continue to avoid placing PermCath at this time.   LOS: 0 Aundray Cartlidge 8/12/202210:00 AM

## 2021-02-19 LAB — POTASSIUM: Potassium: 4.4 mmol/L (ref 3.5–5.1)

## 2021-02-20 LAB — URINE CULTURE

## 2021-02-21 LAB — RENAL FUNCTION PANEL
Albumin: 1.6 g/dL — ABNORMAL LOW (ref 3.5–5.0)
Anion gap: 15 (ref 5–15)
BUN: 109 mg/dL — ABNORMAL HIGH (ref 8–23)
CO2: 23 mmol/L (ref 22–32)
Calcium: 9.9 mg/dL (ref 8.9–10.3)
Chloride: 96 mmol/L — ABNORMAL LOW (ref 98–111)
Creatinine, Ser: 4.19 mg/dL — ABNORMAL HIGH (ref 0.61–1.24)
GFR, Estimated: 15 mL/min — ABNORMAL LOW (ref >60)
Glucose, Bld: 143 mg/dL — ABNORMAL HIGH (ref 70–99)
Phosphorus: 3.7 mg/dL (ref 2.5–4.6)
Potassium: 5.3 mmol/L — ABNORMAL HIGH (ref 3.5–5.1)
Sodium: 134 mmol/L — ABNORMAL LOW (ref 135–145)

## 2021-02-21 LAB — CBC
HCT: 22.7 % — ABNORMAL LOW (ref 39.0–52.0)
Hemoglobin: 7.2 g/dL — ABNORMAL LOW (ref 13.0–17.0)
MCH: 28.9 pg (ref 26.0–34.0)
MCHC: 31.7 g/dL (ref 30.0–36.0)
MCV: 91.2 fL (ref 80.0–100.0)
Platelets: 549 10*3/uL — ABNORMAL HIGH (ref 150–400)
RBC: 2.49 MIL/uL — ABNORMAL LOW (ref 4.22–5.81)
RDW: 16.4 % — ABNORMAL HIGH (ref 11.5–15.5)
WBC: 25.9 10*3/uL — ABNORMAL HIGH (ref 4.0–10.5)
nRBC: 0 % (ref 0.0–0.2)

## 2021-02-21 LAB — CULTURE, BLOOD (ROUTINE X 2)
Culture: NO GROWTH
Culture: NO GROWTH
Special Requests: ADEQUATE
Special Requests: ADEQUATE

## 2021-02-21 NOTE — Progress Notes (Signed)
[facility]  ROUNDING NOTE   Subjective:  Patient currently sitting up in chair. 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 However patient not stable enough. Tracheostomy secretions noted this AM.   Objective:  Vital signs in last 24 hours:  Temperature 96.8 pulse 91 respirations 20 blood pressure 141/68  Physical Exam: General:  No acute distress  Head:  Normocephalic, atraumatic. 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  trace right lower extremity edema  Neurologic:  myoclonic facial twitching noted  Skin:  No acute rash  Access:  Right IJ temporary dialysis catheter    Basic Metabolic Panel: Recent Labs  Lab 02/16/21 0511 02/18/21 0427 02/19/21 1109 02/21/21 0733  NA 135 130*  --  134*  K 4.6 5.2* 4.4 5.3*  CL 96* 91*  --  96*  CO2 24 23  --  23  GLUCOSE 118* 118*  --  143*  BUN 71* 79*  --  109*  CREATININE 3.86* 3.58*  --  4.19*  CALCIUM 9.4 9.8  --  9.9  PHOS 5.2* 4.4  --  3.7     Liver Function Tests: Recent Labs  Lab 02/16/21 0511 02/18/21 0427 02/21/21 0733  ALBUMIN 1.7* 1.7* 1.6*    No results for input(s): LIPASE, AMYLASE in the last 168 hours. No results for input(s): AMMONIA in the last 168 hours.  CBC: Recent Labs  Lab 02/14/21 2009 02/16/21 0511 02/18/21 0427  WBC  --  18.3* 20.6*  HGB 8.6* 7.8* 7.1*  HCT 25.8* 24.2* 22.5*  MCV  --  89.6 92.6  PLT  --  555* 544*     Cardiac Enzymes: No results for input(s): CKTOTAL, CKMB, CKMBINDEX, TROPONINI in the last 168 hours.  BNP: Invalid input(s): POCBNP  CBG: No results for input(s): GLUCAP in the last 168 hours.  Microbiology: Results for orders placed or performed during the hospital encounter of 11/21/20  Culture, blood (routine x 2)     Status: None    Collection Time: 11/25/20  3:25 PM   Specimen: BLOOD RIGHT HAND  Result Value Ref Range Status   Specimen Description BLOOD RIGHT HAND  Final   Special Requests   Final    BOTTLES DRAWN AEROBIC ONLY Blood Culture results may not be optimal due to an inadequate volume of blood received in culture bottles   Culture   Final    NO GROWTH 5 DAYS Performed at [facility], [address], [place], [place] [address]    Report Status 11/30/2020 FINAL  Final  Culture, blood (routine x 2)     Status: None   Collection Time: 11/25/20  3:29 PM   Specimen: BLOOD LEFT HAND  Result Value Ref Range Status   Specimen Description BLOOD LEFT HAND  Final   Special Requests   Final    BOTTLES DRAWN AEROBIC AND ANAEROBIC Blood Culture adequate volume   Culture   Final    NO GROWTH 5 DAYS Performed at [facility], [address] [place], [place] [address]    Report Status 11/30/2020 FINAL  Final  Culture, Respiratory w Gram Stain     Status: None   Collection Time: 12/06/20 10:40 AM   Specimen: Tracheal Aspirate; Respiratory  Result Value  Ref Range Status   Specimen Description TRACHEAL ASPIRATE  Final   Special Requests NONE  Final   Gram Stain   Final    ABUNDANT WBC PRESENT, PREDOMINANTLY PMN ABUNDANT GRAM NEGATIVE RODS FEW GRAM POSITIVE COCCI FEW GRAM POSITIVE RODS    Culture   Final    MODERATE PSEUDOMONAS AERUGINOSA Two isolates with different morphologies were identified as the same organism.The most resistant organism was reported. Performed at [facility], [address], [place], [place] [address]    Report Status 12/09/2020 FINAL  Final   Organism ID, Bacteria PSEUDOMONAS AERUGINOSA  Final      Susceptibility   Pseudomonas aeruginosa - MIC*    CEFTAZIDIME 16 INTERMEDIATE Intermediate     CIPROFLOXACIN 1 SENSITIVE Sensitive     GENTAMICIN 2 SENSITIVE Sensitive     IMIPENEM >=16 RESISTANT Resistant     * MODERATE PSEUDOMONAS AERUGINOSA  Culture, blood (routine  x 2)     Status: None   Collection Time: 12/13/20  9:09 AM   Specimen: BLOOD LEFT HAND  Result Value Ref Range Status   Specimen Description BLOOD LEFT HAND  Final   Special Requests   Final    BOTTLES DRAWN AEROBIC AND ANAEROBIC Blood Culture results may not be optimal due to an inadequate volume of blood received in culture bottles   Culture   Final    NO GROWTH 5 DAYS Performed at [facility], [address] [place], [place] [address]    Report Status 12/18/2020 FINAL  Final  Culture, blood (routine x 2)     Status: Abnormal   Collection Time: 12/13/20  9:13 AM   Specimen: BLOOD  Result Value Ref Range Status   Specimen Description BLOOD RIGHT ANTECUBITAL  Final   Special Requests   Final    BOTTLES DRAWN AEROBIC AND ANAEROBIC Blood Culture adequate volume   Culture  Setup Time   Final    GRAM POSITIVE COCCI IN CLUSTERS AEROBIC BOTTLE ONLY CRITICAL RESULT CALLED TO, READ BACK BY AND VERIFIED WITH: RN C.ROBENSON AT 1234 ON 12/14/2020 BY T.SAAD.    Culture (A)  Final    STAPHYLOCOCCUS EPIDERMIDIS THE SIGNIFICANCE OF ISOLATING THIS ORGANISM FROM A SINGLE SET OF BLOOD CULTURES WHEN MULTIPLE SETS ARE DRAWN IS UNCERTAIN. PLEASE NOTIFY THE MICROBIOLOGY DEPARTMENT WITHIN ONE WEEK IF SPECIATION AND SENSITIVITIES ARE REQUIRED. Performed at [facility], [address] [place], [place] [address]    Report Status 12/16/2020 FINAL  Final  Blood Culture ID Panel (Reflexed)     Status: Abnormal   Collection Time: 12/13/20  9:13 AM  Result Value Ref Range Status   Enterococcus faecalis NOT DETECTED NOT DETECTED Final   Enterococcus Faecium NOT DETECTED NOT DETECTED Final   Listeria monocytogenes NOT DETECTED NOT DETECTED Final   Staphylococcus species DETECTED (A) NOT DETECTED Final    Comment: CRITICAL RESULT CALLED TO, READ BACK BY AND VERIFIED WITH: RN C.ROBERTSON AT 1234 ON 12/14/2020 BY T.SAAD.    Staphylococcus aureus (BCID) NOT DETECTED NOT DETECTED Final    Staphylococcus epidermidis DETECTED (A) NOT DETECTED Final    Comment: Methicillin (oxacillin) resistant coagulase negative staphylococcus. Possible blood culture contaminant (unless isolated from more than one blood culture draw or clinical case suggests pathogenicity). No antibiotic treatment is indicated for blood  culture contaminants. CRITICAL RESULT CALLED TO, READ BACK BY AND VERIFIED WITH: RN C.ROBERTSON AT 1234 ON 12/14/2020 BY T.SAAD.    Staphylococcus lugdunensis NOT DETECTED NOT DETECTED  Final   Streptococcus species NOT DETECTED NOT DETECTED Final   Streptococcus agalactiae NOT DETECTED NOT DETECTED Final   Streptococcus pneumoniae NOT DETECTED NOT DETECTED Final   Streptococcus pyogenes NOT DETECTED NOT DETECTED Final   A.calcoaceticus-baumannii NOT DETECTED NOT DETECTED Final   Bacteroides fragilis NOT DETECTED NOT DETECTED Final   Enterobacterales NOT DETECTED NOT DETECTED Final   Enterobacter cloacae complex NOT DETECTED NOT DETECTED Final   Escherichia coli NOT DETECTED NOT DETECTED Final   Klebsiella aerogenes NOT DETECTED NOT DETECTED Final   Klebsiella oxytoca NOT DETECTED NOT DETECTED Final   Klebsiella pneumoniae NOT DETECTED NOT DETECTED Final   Proteus species NOT DETECTED NOT DETECTED Final   Salmonella species NOT DETECTED NOT DETECTED Final   Serratia marcescens NOT DETECTED NOT DETECTED Final   Haemophilus influenzae NOT DETECTED NOT DETECTED Final   Neisseria meningitidis NOT DETECTED NOT DETECTED Final   Pseudomonas aeruginosa NOT DETECTED NOT DETECTED Final   Stenotrophomonas maltophilia NOT DETECTED NOT DETECTED Final   Candida albicans NOT DETECTED NOT DETECTED Final   Candida auris NOT DETECTED NOT DETECTED Final   Candida glabrata NOT DETECTED NOT DETECTED Final   Candida krusei NOT DETECTED NOT DETECTED Final   Candida parapsilosis NOT DETECTED NOT DETECTED Final   Candida tropicalis NOT DETECTED NOT DETECTED Final   Cryptococcus neoformans/gattii  NOT DETECTED NOT DETECTED Final   Methicillin resistance mecA/C DETECTED (A) NOT DETECTED Final    Comment: CRITICAL RESULT CALLED TO, READ BACK BY AND VERIFIED WITH: RN C.ROBERTSON AT 1234 ON 12/14/2020 BY T.SAAD. Performed at [facility], [address], [place], [place] [address]   Culture, blood (routine x 2)     Status: None   Collection Time: 12/17/20 11:31 AM   Specimen: BLOOD  Result Value Ref Range Status   Specimen Description BLOOD RIGHT ANTECUBITAL  Final   Special Requests   Final    BOTTLES DRAWN AEROBIC ONLY Blood Culture results may not be optimal due to an inadequate volume of blood received in culture bottles   Culture   Final    NO GROWTH 5 DAYS Performed at [facility], [address], [place], [place] [address]    Report Status 12/22/2020 FINAL  Final  Culture, blood (routine x 2)     Status: None   Collection Time: 12/17/20 11:38 AM   Specimen: BLOOD LEFT HAND  Result Value Ref Range Status   Specimen Description BLOOD LEFT HAND  Final   Special Requests   Final    BOTTLES DRAWN AEROBIC ONLY Blood Culture adequate volume   Culture   Final    NO GROWTH 5 DAYS Performed at [facility], [address], [place], [place] [address]    Report Status 12/22/2020 FINAL  Final  Culture, blood (routine x 2)     Status: None   Collection Time: 12/29/20  8:10 AM   Specimen: BLOOD RIGHT HAND  Result Value Ref Range Status   Specimen Description BLOOD RIGHT HAND  Final   Special Requests   Final    BOTTLES DRAWN AEROBIC AND ANAEROBIC Blood Culture results may not be optimal due to an inadequate volume of blood received in culture bottles   Culture   Final    NO GROWTH 5 DAYS Performed at [facility], [address] [place], [place] [address]    Report Status 01/03/2021 FINAL  Final  Culture, blood (routine x 2)     Status: None  Collection Time: 12/29/20  8:17 AM   Specimen: BLOOD RIGHT HAND  Result Value Ref Range Status   Specimen  Description BLOOD RIGHT HAND  Final   Special Requests   Final    BOTTLES DRAWN AEROBIC ONLY Blood Culture results may not be optimal due to an inadequate volume of blood received in culture bottles   Culture   Final    NO GROWTH 5 DAYS Performed at [facility], [address], [place], [place] [address]    Report Status 01/03/2021 FINAL  Final  Culture, Respiratory w Gram Stain     Status: None   Collection Time: 01/01/21 11:54 AM   Specimen: Tracheal Aspirate; Respiratory  Result Value Ref Range Status   Specimen Description TRACHEAL ASPIRATE  Final   Special Requests NONE  Final   Gram Stain   Final    RARE WBC PRESENT,BOTH PMN AND MONONUCLEAR FEW GRAM NEGATIVE RODS Performed at [facility], [address] [place], [place] [address]    Culture MODERATE PSEUDOMONAS AERUGINOSA  Final   Report Status 01/03/2021 FINAL  Final   Organism ID, Bacteria PSEUDOMONAS AERUGINOSA  Final      Susceptibility   Pseudomonas aeruginosa - MIC*    CEFTAZIDIME 4 SENSITIVE Sensitive     CIPROFLOXACIN 1 SENSITIVE Sensitive     GENTAMICIN <=1 SENSITIVE Sensitive     IMIPENEM 2 SENSITIVE Sensitive     PIP/TAZO <=4 SENSITIVE Sensitive     CEFEPIME 2 SENSITIVE Sensitive     * MODERATE PSEUDOMONAS AERUGINOSA  Culture, blood (routine x 2)     Status: None   Collection Time: 01/04/21 11:02 AM   Specimen: BLOOD RIGHT HAND  Result Value Ref Range Status   Specimen Description BLOOD RIGHT HAND  Final   Special Requests   Final    BOTTLES DRAWN AEROBIC AND ANAEROBIC Blood Culture adequate volume   Culture   Final    NO GROWTH 5 DAYS Performed at [facility], [address] [place], [place] [address]    Report Status 01/09/2021 FINAL  Final  Culture, blood (routine x 2)     Status: Abnormal   Collection Time: 01/04/21 11:11 AM   Specimen: BLOOD RIGHT FOREARM  Result Value Ref Range Status   Specimen Description BLOOD RIGHT FOREARM  Final   Special Requests   Final    BOTTLES  DRAWN AEROBIC AND ANAEROBIC Blood Culture results may not be optimal due to an inadequate volume of blood received in culture bottles   Culture  Setup Time   Final    GRAM POSITIVE COCCI IN CLUSTERS ANAEROBIC BOTTLE ONLY CRITICAL RESULT CALLED TO, READ BACK BY AND VERIFIED WITH: RN D [place] [id number] AT 1003 BY CM    Culture (A)  Final    STAPHYLOCOCCUS EPIDERMIDIS THE SIGNIFICANCE OF ISOLATING THIS ORGANISM FROM A SINGLE SET OF BLOOD CULTURES WHEN MULTIPLE SETS ARE DRAWN IS UNCERTAIN. PLEASE NOTIFY THE MICROBIOLOGY DEPARTMENT WITHIN ONE WEEK IF SPECIATION AND SENSITIVITIES ARE REQUIRED. Performed at [facility], [address] [place], [place] [address]    Report Status 01/07/2021 FINAL  Final  Culture, blood (routine x 2)     Status: None   Collection Time: 01/09/21  5:00 PM   Specimen: BLOOD  Result Value Ref Range Status   Specimen Description BLOOD RIGHT ANTECUBITAL  Final   Special Requests   Final    BOTTLES DRAWN AEROBIC AND ANAEROBIC Blood Culture results may not  be optimal due to an inadequate volume of blood received in culture bottles   Culture   Final    NO GROWTH 5 DAYS Performed at [facility], [address] [place], [place] [address]    Report Status 01/14/2021 FINAL  Final  Culture, blood (routine x 2)     Status: Abnormal   Collection Time: 01/09/21  5:04 PM   Specimen: BLOOD  Result Value Ref Range Status   Specimen Description BLOOD RIGHT ANTECUBITAL  Final   Special Requests   Final    BOTTLES DRAWN AEROBIC ONLY Blood Culture results may not be optimal due to an inadequate volume of blood received in culture bottles   Culture  Setup Time   Final    GRAM POSITIVE COCCI IN CLUSTERS AEROBIC BOTTLE ONLY CRITICAL VALUE NOTED.  VALUE IS CONSISTENT WITH PREVIOUSLY REPORTED AND CALLED VALUE.    Culture (A)  Final    STAPHYLOCOCCUS CAPITIS THE SIGNIFICANCE OF ISOLATING THIS ORGANISM FROM A SINGLE SET OF BLOOD CULTURES WHEN MULTIPLE SETS ARE DRAWN  IS UNCERTAIN. PLEASE NOTIFY THE MICROBIOLOGY DEPARTMENT WITHIN ONE WEEK IF SPECIATION AND SENSITIVITIES ARE REQUIRED. Performed at [facility], [address] [place], [place] [address]    Report Status 01/11/2021 FINAL  Final  Culture, Respiratory w Gram Stain     Status: None   Collection Time: 01/14/21  6:04 PM   Specimen: Tracheal Aspirate; Respiratory  Result Value Ref Range Status   Specimen Description TRACHEAL ASPIRATE  Final   Special Requests NONE  Final   Gram Stain   Final    RARE WBC PRESENT,BOTH PMN AND MONONUCLEAR FEW SQUAMOUS EPITHELIAL CELLS PRESENT FEW GRAM NEGATIVE RODS RARE GRAM POSITIVE COCCI Performed at [facility], [address] [place], [place] [address]    Culture MODERATE PSEUDOMONAS AERUGINOSA  Final   Report Status 01/18/2021 FINAL  Final   Organism ID, Bacteria PSEUDOMONAS AERUGINOSA  Final      Susceptibility   Pseudomonas aeruginosa - MIC*    CEFTAZIDIME 4 SENSITIVE Sensitive     CIPROFLOXACIN 2 INTERMEDIATE Intermediate     GENTAMICIN 4 SENSITIVE Sensitive     IMIPENEM >=16 RESISTANT Resistant     CEFEPIME 4 SENSITIVE Sensitive     * MODERATE PSEUDOMONAS AERUGINOSA  Culture, blood (routine x 2)     Status: None   Collection Time: 01/24/21  2:20 PM   Specimen: BLOOD LEFT HAND  Result Value Ref Range Status   Specimen Description BLOOD LEFT HAND  Final   Special Requests   Final    BOTTLES DRAWN AEROBIC AND ANAEROBIC Blood Culture adequate volume   Culture   Final    NO GROWTH 5 DAYS Performed at [facility], [address], [place], [place] [address]    Report Status 01/29/2021 FINAL  Final  Culture, blood (routine x 2)     Status: None   Collection Time: 01/24/21  2:24 PM   Specimen: BLOOD LEFT HAND  Result Value Ref Range Status   Specimen Description BLOOD LEFT HAND  Final   Special Requests   Final    BOTTLES DRAWN AEROBIC AND ANAEROBIC Blood Culture results may not be optimal due to an inadequate volume of  blood received in culture bottles   Culture   Final    NO GROWTH 5 DAYS Performed at [facility], [address], [place], [place] [address]    Report Status 01/29/2021 FINAL  Final  Culture, Respiratory w Gram Stain     Status: None   Collection Time: 02/01/21 12:02 PM   Specimen: Tracheal Aspirate; Respiratory  Result Value Ref Range Status   Specimen Description TRACHEAL ASPIRATE  Final   Special Requests NONE  Final   Gram Stain   Final    NO WBC SEEN FEW GRAM VARIABLE ROD Performed at [facility], [address], [place], [place] [address]    Culture   Final    ABUNDANT PSEUDOMONAS AERUGINOSA ABUNDANT KLEBSIELLA PNEUMONIAE Confirmed Extended Spectrum Beta-Lactamase Producer (ESBL).  In bloodstream infections from ESBL organisms, carbapenems are preferred over piperacillin/tazobactam. They are shown to have a lower risk of mortality.    Report Status 02/04/2021 FINAL  Final   Organism ID, Bacteria PSEUDOMONAS AERUGINOSA  Final   Organism ID, Bacteria KLEBSIELLA PNEUMONIAE  Final      Susceptibility   Klebsiella pneumoniae - MIC*    AMPICILLIN >=32 RESISTANT Resistant     CEFAZOLIN >=64 RESISTANT Resistant     CEFEPIME >=32 RESISTANT Resistant     CEFTAZIDIME >=64 RESISTANT Resistant     CEFTRIAXONE >=64 RESISTANT Resistant     CIPROFLOXACIN >=4 RESISTANT Resistant     GENTAMICIN >=16 RESISTANT Resistant     IMIPENEM <=0.25 SENSITIVE Sensitive     TRIMETH/SULFA >=320 RESISTANT Resistant     AMPICILLIN/SULBACTAM >=32 RESISTANT Resistant     PIP/TAZO >=128 RESISTANT Resistant     * ABUNDANT KLEBSIELLA PNEUMONIAE   Pseudomonas aeruginosa - MIC*    CEFTAZIDIME 4 SENSITIVE Sensitive     CIPROFLOXACIN 2 INTERMEDIATE Intermediate     GENTAMICIN 4 SENSITIVE Sensitive     IMIPENEM 2 SENSITIVE Sensitive     PIP/TAZO 16 SENSITIVE Sensitive     * ABUNDANT PSEUDOMONAS AERUGINOSA  Culture, blood (routine x 2)     Status: Abnormal   Collection Time: 02/04/21  6:10 PM    Specimen: BLOOD RIGHT HAND  Result Value Ref Range Status   Specimen Description BLOOD RIGHT HAND  Final   Special Requests   Final    BOTTLES DRAWN AEROBIC AND ANAEROBIC Blood Culture adequate volume   Culture  Setup Time (A)  Final    GRAM VARIABLE ROD IN BOTH AEROBIC AND ANAEROBIC BOTTLES CRITICAL RESULT CALLED TO, READ BACK BY AND VERIFIED WITH: T,ROBINSON RN @1507  02/06/21 EB    Culture (A)  Final    LACTOBACILLUS CASEI Standardized susceptibility testing for this organism is not available. Performed at [facility], [address] [place], [place]    Report Status 02/08/2021 FINAL  Final  Blood Culture ID Panel (Reflexed)     Status: None   Collection Time: 02/04/21  6:10 PM  Result Value Ref Range Status   Enterococcus faecalis NOT DETECTED NOT DETECTED Final   Enterococcus Faecium NOT DETECTED NOT DETECTED Final   Listeria monocytogenes NOT DETECTED NOT DETECTED Final   Staphylococcus species NOT DETECTED NOT DETECTED Final   Staphylococcus aureus (BCID) NOT DETECTED NOT DETECTED Final   Staphylococcus epidermidis NOT DETECTED NOT DETECTED Final   Staphylococcus lugdunensis NOT DETECTED NOT DETECTED Final   Streptococcus species NOT DETECTED NOT DETECTED Final   Streptococcus agalactiae NOT DETECTED NOT DETECTED Final   Streptococcus pneumoniae NOT DETECTED NOT DETECTED Final   Streptococcus pyogenes NOT DETECTED NOT DETECTED Final   A.calcoaceticus-baumannii NOT DETECTED NOT DETECTED Final   Bacteroides fragilis NOT DETECTED NOT DETECTED Final   Enterobacterales NOT DETECTED NOT DETECTED Final   Enterobacter  cloacae complex NOT DETECTED NOT DETECTED Final   Escherichia coli NOT DETECTED NOT DETECTED Final   Klebsiella aerogenes NOT DETECTED NOT DETECTED Final   Klebsiella oxytoca NOT DETECTED NOT DETECTED Final   Klebsiella pneumoniae NOT DETECTED NOT DETECTED Final   Proteus species NOT DETECTED NOT DETECTED Final   Salmonella species NOT DETECTED  NOT DETECTED Final   Serratia marcescens NOT DETECTED NOT DETECTED Final   Haemophilus influenzae NOT DETECTED NOT DETECTED Final   Neisseria meningitidis NOT DETECTED NOT DETECTED Final   Pseudomonas aeruginosa NOT DETECTED NOT DETECTED Final   Stenotrophomonas maltophilia NOT DETECTED NOT DETECTED Final   Candida albicans NOT DETECTED NOT DETECTED Final   Candida auris NOT DETECTED NOT DETECTED Final   Candida glabrata NOT DETECTED NOT DETECTED Final   Candida krusei NOT DETECTED NOT DETECTED Final   Candida parapsilosis NOT DETECTED NOT DETECTED Final   Candida tropicalis NOT DETECTED NOT DETECTED Final   Cryptococcus neoformans/gattii NOT DETECTED NOT DETECTED Final    Comment: Performed at [facility], [address] [place], [place] [address]  Culture, blood (routine x 2)     Status: Abnormal   Collection Time: 02/04/21  6:25 PM   Specimen: BLOOD  Result Value Ref Range Status   Specimen Description BLOOD RIGHT ANTECUBITAL  Final   Special Requests   Final    BOTTLES DRAWN AEROBIC AND ANAEROBIC Blood Culture adequate volume   Culture  Setup Time (A)  Final    GRAM VARIABLE ROD IN BOTH AEROBIC AND ANAEROBIC BOTTLES    Culture (A)  Final    LACTOBACILLUS CASEI Standardized susceptibility testing for this organism is not available. Performed at [facility], [address] [place], [place] [address]    Report Status 02/08/2021 FINAL  Final  Culture, blood (routine x 2)     Status: Abnormal   Collection Time: 02/08/21  3:43 PM   Specimen: BLOOD  Result Value Ref Range Status   Specimen Description BLOOD BLOOD RIGHT HAND  Final   Special Requests   Final    BOTTLES DRAWN AEROBIC AND ANAEROBIC Blood Culture adequate volume   Culture  Setup Time   Final    GRAM POSITIVE RODS IN BOTH AEROBIC AND ANAEROBIC BOTTLES CRITICAL RESULT CALLED TO, READ BACK BY AND VERIFIED WITH: C NESKEO,RN@0622  02/11/21 MKELLY    Culture (A)  Final    LACTOBACILLUS  SPECIES Standardized susceptibility testing for this organism is not available. Performed at [facility], [address] [place], [place] [address]    Report Status 02/13/2021 FINAL  Final  Culture, blood (routine x 2)     Status: Abnormal   Collection Time: 02/08/21  3:43 PM   Specimen: BLOOD LEFT HAND  Result Value Ref Range Status   Specimen Description BLOOD LEFT HAND  Final   Special Requests   Final    BOTTLES DRAWN AEROBIC AND ANAEROBIC Blood Culture adequate volume   Culture  Setup Time (A)  Final    GRAM VARIABLE ROD IN BOTH AEROBIC AND ANAEROBIC BOTTLES CRITICAL VALUE NOTED.  VALUE IS CONSISTENT WITH PREVIOUSLY REPORTED AND CALLED VALUE.    Culture (A)  Final    LACTOBACILLUS SPECIES Standardized susceptibility testing for this organism is not available. Performed at [facility], [address] [place], [place] [address]    Report Status 02/13/2021 FINAL  Final  Culture, blood (routine x 2)     Status: None   Collection Time:  02/16/21 12:24 PM   Specimen: BLOOD  Result Value Ref Range Status   Specimen Description BLOOD LEFT ANTECUBITAL  Final   Special Requests   Final    BOTTLES DRAWN AEROBIC AND ANAEROBIC Blood Culture adequate volume   Culture   Final    NO GROWTH 5 DAYS Performed at [facility], [address] [place], [place] [address]    Report Status 02/21/2021 FINAL  Final  Culture, blood (routine x 2)     Status: None   Collection Time: 02/16/21 12:29 PM   Specimen: BLOOD  Result Value Ref Range Status   Specimen Description BLOOD RIGHT ANTECUBITAL  Final   Special Requests   Final    BOTTLES DRAWN AEROBIC AND ANAEROBIC Blood Culture adequate volume   Culture   Final    NO GROWTH 5 DAYS Performed at [facility], [address], [place], [place] [address]    Report Status 02/21/2021 FINAL  Final  Urine Culture     Status: Abnormal   Collection Time: 02/18/21  3:45 PM   Specimen: In/Out Cath Urine  Result Value Ref  Range Status   Specimen Description IN/OUT CATH URINE  Final   Special Requests   Final    NONE Performed at [facility], [address], [place], [place] [address]    Culture MULTIPLE SPECIES PRESENT, SUGGEST RECOLLECTION (A)  Final   Report Status 02/20/2021 FINAL  Final    Coagulation Studies: No results for input(s): LABPROT, INR in the last 72 hours.  Urinalysis: Recent Labs    02/18/21 1550  COLORURINE YELLOW  YELLOW  LABSPEC 1.020  1.019  PHURINE 6.5  6.0  GLUCOSEU NEGATIVE  NEGATIVE  HGBUR SMALL*  NEGATIVE  BILIRUBINUR NEGATIVE  NEGATIVE  KETONESUR NEGATIVE  NEGATIVE  PROTEINUR 100*  100*  NITRITE NEGATIVE  NEGATIVE  LEUKOCYTESUR LARGE*  MODERATE*       Imaging: No results found.   Medications:       Assessment/ Plan:  65 y.o. male with a PMHx of ESRD on HD TTS, anemia of chronic kidney disease, hypertension, acute respiratory failure status post tracheostomy placement, seizure disorder, hyperlipidemia, diabetes mellitus type 2, history of hyperkalemia, history of coronary artery disease, peripheral vascular disease who was admitted to [facility] on 11/21/2020 for ongoing treatment of acute respiratory failure status post tracheostomy placement and ESRD.   1.  ESRD on HD.   Patient due for hemodialysis treatment today.  2.  Acute respiratory failure.  Increased secretions noted at the moment.  Patient transition to T-piece.  3.  Anemia of chronic kidney disease.  Hemoglobin 7.1.  Continue to monitor CBC.  4.  Secondary hyperparathyroidism.  Phosphorus 3.7 and acceptable.  5.  Fever/sepsis.  Lactobacillus noted in the blood.  WBC count elevated at 20.6.  Continue to avoid placing PermCath at this time.   LOS: 0 Joel Hebert 8/15/20229:38 AM

## 2021-02-22 LAB — POTASSIUM: Potassium: 4 mmol/L (ref 3.5–5.1)

## 2021-02-23 LAB — RENAL FUNCTION PANEL
Albumin: 1.8 g/dL — ABNORMAL LOW (ref 3.5–5.0)
Anion gap: 15 (ref 5–15)
BUN: 104 mg/dL — ABNORMAL HIGH (ref 8–23)
CO2: 24 mmol/L (ref 22–32)
Calcium: 9.7 mg/dL (ref 8.9–10.3)
Chloride: 97 mmol/L — ABNORMAL LOW (ref 98–111)
Creatinine, Ser: 3.64 mg/dL — ABNORMAL HIGH (ref 0.61–1.24)
GFR, Estimated: 18 mL/min — ABNORMAL LOW (ref >60)
Glucose, Bld: 150 mg/dL — ABNORMAL HIGH (ref 70–99)
Phosphorus: 2.3 mg/dL — ABNORMAL LOW (ref 2.5–4.6)
Potassium: 5 mmol/L (ref 3.5–5.1)
Sodium: 136 mmol/L (ref 135–145)

## 2021-02-23 LAB — PREPARE RBC (CROSSMATCH)

## 2021-02-23 NOTE — Progress Notes (Signed)
[facility]  ROUNDING NOTE   Subjective:  Patient seen and evaluated at bedside. Remains on T-piece. Lots of secretions noted coming out of his T-piece.   Objective:  Vital signs in last 24 hours:  Temperature 98.9 pulse 103 respirations 21 blood pressure 146/66  Physical Exam: General:  No acute distress  Head:  Normocephalic, atraumatic. 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  trace right lower extremity edema  Neurologic:  Myoclonic facial twitching noted  Skin:  No acute rash  Access:  Right IJ temporary dialysis catheter    Basic Metabolic Panel: Recent Labs  Lab 02/18/21 0427 02/19/21 1109 02/21/21 0733 02/22/21 1042 02/23/21 0515  NA 130*  --  134*  --  136  K 5.2* 4.4 5.3* 4.0 5.0  CL 91*  --  96*  --  97*  CO2 23  --  23  --  24  GLUCOSE 118*  --  143*  --  150*  BUN 79*  --  109*  --  104*  CREATININE 3.58*  --  4.19*  --  3.64*  CALCIUM 9.8  --  9.9  --  9.7  PHOS 4.4  --  3.7  --  2.3*     Liver Function Tests: Recent Labs  Lab 02/18/21 0427 02/21/21 0733 02/23/21 0515  ALBUMIN 1.7* 1.6* 1.8*    No results for input(s): LIPASE, AMYLASE in the last 168 hours. No results for input(s): AMMONIA in the last 168 hours.  CBC: Recent Labs  Lab 02/18/21 0427 02/21/21 0733 02/23/21 0654  WBC 20.6* 25.9* 24.3*  HGB 7.1* 7.2* 6.5*  HCT 22.5* 22.7* 20.0*  MCV 92.6 91.2 90.1  PLT 544* 549* 491*     Cardiac Enzymes: No results for input(s): CKTOTAL, CKMB, CKMBINDEX, TROPONINI in the last 168 hours.  BNP: Invalid input(s): POCBNP  CBG: No results for input(s): GLUCAP in the last 168 hours.  Microbiology: Results for orders placed or performed during the hospital encounter of 11/21/20  Culture, blood (routine x 2)     Status: None   Collection Time:  11/25/20  3:25 PM   Specimen: BLOOD RIGHT HAND  Result Value Ref Range Status   Specimen Description BLOOD RIGHT HAND  Final   Special Requests   Final    BOTTLES DRAWN AEROBIC ONLY Blood Culture results may not be optimal due to an inadequate volume of blood received in culture bottles   Culture   Final    NO GROWTH 5 DAYS Performed at [facility], [address] [place], [place] [address]    Report Status 11/30/2020 FINAL  Final  Culture, blood (routine x 2)     Status: None   Collection Time: 11/25/20  3:29 PM   Specimen: BLOOD LEFT HAND  Result Value Ref Range Status   Specimen Description BLOOD LEFT HAND  Final   Special Requests   Final    BOTTLES DRAWN AEROBIC AND ANAEROBIC Blood Culture adequate volume   Culture   Final    NO GROWTH 5 DAYS Performed at [facility], [address] [place], [place] [address]    Report Status 11/30/2020 FINAL  Final  Culture, Respiratory w Gram Stain     Status: None   Collection Time: 12/06/20 10:40  AM   Specimen: Tracheal Aspirate; Respiratory  Result Value Ref Range Status   Specimen Description TRACHEAL ASPIRATE  Final   Special Requests NONE  Final   Gram Stain   Final    ABUNDANT WBC PRESENT, PREDOMINANTLY PMN ABUNDANT GRAM NEGATIVE RODS FEW GRAM POSITIVE COCCI FEW GRAM POSITIVE RODS    Culture   Final    MODERATE PSEUDOMONAS AERUGINOSA Two isolates with different morphologies were identified as the same organism.The most resistant organism was reported. Performed at [facility], [address], [place], [place] [address]    Report Status 12/09/2020 FINAL  Final   Organism ID, Bacteria PSEUDOMONAS AERUGINOSA  Final      Susceptibility   Pseudomonas aeruginosa - MIC*    CEFTAZIDIME 16 INTERMEDIATE Intermediate     CIPROFLOXACIN 1 SENSITIVE Sensitive     GENTAMICIN 2 SENSITIVE Sensitive     IMIPENEM >=16 RESISTANT Resistant     * MODERATE PSEUDOMONAS AERUGINOSA  Culture, blood (routine x 2)     Status:  None   Collection Time: 12/13/20  9:09 AM   Specimen: BLOOD LEFT HAND  Result Value Ref Range Status   Specimen Description BLOOD LEFT HAND  Final   Special Requests   Final    BOTTLES DRAWN AEROBIC AND ANAEROBIC Blood Culture results may not be optimal due to an inadequate volume of blood received in culture bottles   Culture   Final    NO GROWTH 5 DAYS Performed at [facility], [address] [place], [place] [address]    Report Status 12/18/2020 FINAL  Final  Culture, blood (routine x 2)     Status: Abnormal   Collection Time: 12/13/20  9:13 AM   Specimen: BLOOD  Result Value Ref Range Status   Specimen Description BLOOD RIGHT ANTECUBITAL  Final   Special Requests   Final    BOTTLES DRAWN AEROBIC AND ANAEROBIC Blood Culture adequate volume   Culture  Setup Time   Final    GRAM POSITIVE COCCI IN CLUSTERS AEROBIC BOTTLE ONLY CRITICAL RESULT CALLED TO, READ BACK BY AND VERIFIED WITH: RN C.ROBENSON AT 1234 ON 12/14/2020 BY T.SAAD.    Culture (A)  Final    STAPHYLOCOCCUS EPIDERMIDIS THE SIGNIFICANCE OF ISOLATING THIS ORGANISM FROM A SINGLE SET OF BLOOD CULTURES WHEN MULTIPLE SETS ARE DRAWN IS UNCERTAIN. PLEASE NOTIFY THE MICROBIOLOGY DEPARTMENT WITHIN ONE WEEK IF SPECIATION AND SENSITIVITIES ARE REQUIRED. Performed at [facility], [address], [place], [place] [address]    Report Status 12/16/2020 FINAL  Final  Blood Culture ID Panel (Reflexed)     Status: Abnormal   Collection Time: 12/13/20  9:13 AM  Result Value Ref Range Status   Enterococcus faecalis NOT DETECTED NOT DETECTED Final   Enterococcus Faecium NOT DETECTED NOT DETECTED Final   Listeria monocytogenes NOT DETECTED NOT DETECTED Final   Staphylococcus species DETECTED (A) NOT DETECTED Final    Comment: CRITICAL RESULT CALLED TO, READ BACK BY AND VERIFIED WITH: RN C.ROBERTSON AT 1234 ON 12/14/2020 BY T.SAAD.    Staphylococcus aureus (BCID) NOT DETECTED NOT DETECTED Final   Staphylococcus epidermidis  DETECTED (A) NOT DETECTED Final    Comment: Methicillin (oxacillin) resistant coagulase negative staphylococcus. Possible blood culture contaminant (unless isolated from more than one blood culture draw or clinical case suggests pathogenicity). No antibiotic treatment is indicated for blood  culture contaminants. CRITICAL RESULT CALLED TO, READ BACK BY AND VERIFIED WITH: RN C.ROBERTSON AT 1234 ON 12/14/2020 BY  T.SAAD.    Staphylococcus lugdunensis NOT DETECTED NOT DETECTED Final   Streptococcus species NOT DETECTED NOT DETECTED Final   Streptococcus agalactiae NOT DETECTED NOT DETECTED Final   Streptococcus pneumoniae NOT DETECTED NOT DETECTED Final   Streptococcus pyogenes NOT DETECTED NOT DETECTED Final   A.calcoaceticus-baumannii NOT DETECTED NOT DETECTED Final   Bacteroides fragilis NOT DETECTED NOT DETECTED Final   Enterobacterales NOT DETECTED NOT DETECTED Final   Enterobacter cloacae complex NOT DETECTED NOT DETECTED Final   Escherichia coli NOT DETECTED NOT DETECTED Final   Klebsiella aerogenes NOT DETECTED NOT DETECTED Final   Klebsiella oxytoca NOT DETECTED NOT DETECTED Final   Klebsiella pneumoniae NOT DETECTED NOT DETECTED Final   Proteus species NOT DETECTED NOT DETECTED Final   Salmonella species NOT DETECTED NOT DETECTED Final   Serratia marcescens NOT DETECTED NOT DETECTED Final   Haemophilus influenzae NOT DETECTED NOT DETECTED Final   Neisseria meningitidis NOT DETECTED NOT DETECTED Final   Pseudomonas aeruginosa NOT DETECTED NOT DETECTED Final   Stenotrophomonas maltophilia NOT DETECTED NOT DETECTED Final   Candida albicans NOT DETECTED NOT DETECTED Final   Candida auris NOT DETECTED NOT DETECTED Final   Candida glabrata NOT DETECTED NOT DETECTED Final   Candida krusei NOT DETECTED NOT DETECTED Final   Candida parapsilosis NOT DETECTED NOT DETECTED Final   Candida tropicalis NOT DETECTED NOT DETECTED Final   Cryptococcus neoformans/gattii NOT DETECTED NOT DETECTED  Final   Methicillin resistance mecA/C DETECTED (A) NOT DETECTED Final    Comment: CRITICAL RESULT CALLED TO, READ BACK BY AND VERIFIED WITH: RN C.ROBERTSON AT 1234 ON 12/14/2020 BY T.SAAD. Performed at [facility], [address] [place], [place] [address]   Culture, blood (routine x 2)     Status: None   Collection Time: 12/17/20 11:31 AM   Specimen: BLOOD  Result Value Ref Range Status   Specimen Description BLOOD RIGHT ANTECUBITAL  Final   Special Requests   Final    BOTTLES DRAWN AEROBIC ONLY Blood Culture results may not be optimal due to an inadequate volume of blood received in culture bottles   Culture   Final    NO GROWTH 5 DAYS Performed at [facility], [address], [place], [place] [address]    Report Status 12/22/2020 FINAL  Final  Culture, blood (routine x 2)     Status: None   Collection Time: 12/17/20 11:38 AM   Specimen: BLOOD LEFT HAND  Result Value Ref Range Status   Specimen Description BLOOD LEFT HAND  Final   Special Requests   Final    BOTTLES DRAWN AEROBIC ONLY Blood Culture adequate volume   Culture   Final    NO GROWTH 5 DAYS Performed at [facility], [address], [place], [place] [address]    Report Status 12/22/2020 FINAL  Final  Culture, blood (routine x 2)     Status: None   Collection Time: 12/29/20  8:10 AM   Specimen: BLOOD RIGHT HAND  Result Value Ref Range Status   Specimen Description BLOOD RIGHT HAND  Final   Special Requests   Final    BOTTLES DRAWN AEROBIC AND ANAEROBIC Blood Culture results may not be optimal due to an inadequate volume of blood received in culture bottles   Culture   Final    NO GROWTH 5 DAYS Performed at [facility], [address], [place], [place] [address]    Report Status 01/03/2021 FINAL  Final  Culture,  blood (routine x 2)     Status: None   Collection Time: 12/29/20  8:17 AM   Specimen: BLOOD RIGHT HAND  Result Value Ref Range Status   Specimen Description BLOOD RIGHT HAND   Final   Special Requests   Final    BOTTLES DRAWN AEROBIC ONLY Blood Culture results may not be optimal due to an inadequate volume of blood received in culture bottles   Culture   Final    NO GROWTH 5 DAYS Performed at [facility], [address], [place], [place] [address]    Report Status 01/03/2021 FINAL  Final  Culture, Respiratory w Gram Stain     Status: None   Collection Time: 01/01/21 11:54 AM   Specimen: Tracheal Aspirate; Respiratory  Result Value Ref Range Status   Specimen Description TRACHEAL ASPIRATE  Final   Special Requests NONE  Final   Gram Stain   Final    RARE WBC PRESENT,BOTH PMN AND MONONUCLEAR FEW GRAM NEGATIVE RODS Performed at [facility], [address], [place], [place] [address]    Culture MODERATE PSEUDOMONAS AERUGINOSA  Final   Report Status 01/03/2021 FINAL  Final   Organism ID, Bacteria PSEUDOMONAS AERUGINOSA  Final      Susceptibility   Pseudomonas aeruginosa - MIC*    CEFTAZIDIME 4 SENSITIVE Sensitive     CIPROFLOXACIN 1 SENSITIVE Sensitive     GENTAMICIN <=1 SENSITIVE Sensitive     IMIPENEM 2 SENSITIVE Sensitive     PIP/TAZO <=4 SENSITIVE Sensitive     CEFEPIME 2 SENSITIVE Sensitive     * MODERATE PSEUDOMONAS AERUGINOSA  Culture, blood (routine x 2)     Status: None   Collection Time: 01/04/21 11:02 AM   Specimen: BLOOD RIGHT HAND  Result Value Ref Range Status   Specimen Description BLOOD RIGHT HAND  Final   Special Requests   Final    BOTTLES DRAWN AEROBIC AND ANAEROBIC Blood Culture adequate volume   Culture   Final    NO GROWTH 5 DAYS Performed at [facility], [address], [place], [place] [address]    Report Status 01/09/2021 FINAL  Final  Culture, blood (routine x 2)     Status: Abnormal   Collection Time: 01/04/21 11:11 AM   Specimen: BLOOD RIGHT FOREARM  Result Value Ref Range Status   Specimen Description BLOOD RIGHT FOREARM  Final   Special Requests   Final    BOTTLES DRAWN AEROBIC AND ANAEROBIC  Blood Culture results may not be optimal due to an inadequate volume of blood received in culture bottles   Culture  Setup Time   Final    GRAM POSITIVE COCCI IN CLUSTERS ANAEROBIC BOTTLE ONLY CRITICAL RESULT CALLED TO, READ BACK BY AND VERIFIED WITH: RN D [place] [id number] AT 1003 BY CM    Culture (A)  Final    STAPHYLOCOCCUS EPIDERMIDIS THE SIGNIFICANCE OF ISOLATING THIS ORGANISM FROM A SINGLE SET OF BLOOD CULTURES WHEN MULTIPLE SETS ARE DRAWN IS UNCERTAIN. PLEASE NOTIFY THE MICROBIOLOGY DEPARTMENT WITHIN ONE WEEK IF SPECIATION AND SENSITIVITIES ARE REQUIRED. Performed at [facility], [address] [place], [place] [address]    Report Status 01/07/2021 FINAL  Final  Culture, blood (routine x 2)     Status: None   Collection Time: 01/09/21  5:00 PM   Specimen: BLOOD  Result Value Ref Range Status   Specimen Description BLOOD RIGHT ANTECUBITAL  Final   Special Requests   Final  BOTTLES DRAWN AEROBIC AND ANAEROBIC Blood Culture results may not be optimal due to an inadequate volume of blood received in culture bottles   Culture   Final    NO GROWTH 5 DAYS Performed at [facility], [address], [place], [place] [address]    Report Status 01/14/2021 FINAL  Final  Culture, blood (routine x 2)     Status: Abnormal   Collection Time: 01/09/21  5:04 PM   Specimen: BLOOD  Result Value Ref Range Status   Specimen Description BLOOD RIGHT ANTECUBITAL  Final   Special Requests   Final    BOTTLES DRAWN AEROBIC ONLY Blood Culture results may not be optimal due to an inadequate volume of blood received in culture bottles   Culture  Setup Time   Final    GRAM POSITIVE COCCI IN CLUSTERS AEROBIC BOTTLE ONLY CRITICAL VALUE NOTED.  VALUE IS CONSISTENT WITH PREVIOUSLY REPORTED AND CALLED VALUE.    Culture (A)  Final    STAPHYLOCOCCUS CAPITIS THE SIGNIFICANCE OF ISOLATING THIS ORGANISM FROM A SINGLE SET OF BLOOD CULTURES WHEN MULTIPLE SETS ARE DRAWN IS UNCERTAIN. PLEASE NOTIFY  THE MICROBIOLOGY DEPARTMENT WITHIN ONE WEEK IF SPECIATION AND SENSITIVITIES ARE REQUIRED. Performed at [facility], [address], [place], [place] [address]    Report Status 01/11/2021 FINAL  Final  Culture, Respiratory w Gram Stain     Status: None   Collection Time: 01/14/21  6:04 PM   Specimen: Tracheal Aspirate; Respiratory  Result Value Ref Range Status   Specimen Description TRACHEAL ASPIRATE  Final   Special Requests NONE  Final   Gram Stain   Final    RARE WBC PRESENT,BOTH PMN AND MONONUCLEAR FEW SQUAMOUS EPITHELIAL CELLS PRESENT FEW GRAM NEGATIVE RODS RARE GRAM POSITIVE COCCI Performed at [facility], [address], [place], [place] [address]    Culture MODERATE PSEUDOMONAS AERUGINOSA  Final   Report Status 01/18/2021 FINAL  Final   Organism ID, Bacteria PSEUDOMONAS AERUGINOSA  Final      Susceptibility   Pseudomonas aeruginosa - MIC*    CEFTAZIDIME 4 SENSITIVE Sensitive     CIPROFLOXACIN 2 INTERMEDIATE Intermediate     GENTAMICIN 4 SENSITIVE Sensitive     IMIPENEM >=16 RESISTANT Resistant     CEFEPIME 4 SENSITIVE Sensitive     * MODERATE PSEUDOMONAS AERUGINOSA  Culture, blood (routine x 2)     Status: None   Collection Time: 01/24/21  2:20 PM   Specimen: BLOOD LEFT HAND  Result Value Ref Range Status   Specimen Description BLOOD LEFT HAND  Final   Special Requests   Final    BOTTLES DRAWN AEROBIC AND ANAEROBIC Blood Culture adequate volume   Culture   Final    NO GROWTH 5 DAYS Performed at [facility], [address] [place], [place] [address]    Report Status 01/29/2021 FINAL  Final  Culture, blood (routine x 2)     Status: None   Collection Time: 01/24/21  2:24 PM   Specimen: BLOOD LEFT HAND  Result Value Ref Range Status   Specimen Description BLOOD LEFT HAND  Final   Special Requests   Final    BOTTLES DRAWN AEROBIC AND ANAEROBIC Blood Culture results may not be optimal due to an inadequate volume of blood received in culture  bottles   Culture   Final    NO GROWTH 5 DAYS Performed at [facility], [address], [place], [place]  [address]    Report Status 01/29/2021 FINAL  Final  Culture, Respiratory w Gram Stain     Status: None   Collection Time: 02/01/21 12:02 PM   Specimen: Tracheal Aspirate; Respiratory  Result Value Ref Range Status   Specimen Description TRACHEAL ASPIRATE  Final   Special Requests NONE  Final   Gram Stain   Final    NO WBC SEEN FEW GRAM VARIABLE ROD Performed at [facility], [address], [place], [place] [address]    Culture   Final    ABUNDANT PSEUDOMONAS AERUGINOSA ABUNDANT KLEBSIELLA PNEUMONIAE Confirmed Extended Spectrum Beta-Lactamase Producer (ESBL).  In bloodstream infections from ESBL organisms, carbapenems are preferred over piperacillin/tazobactam. They are shown to have a lower risk of mortality.    Report Status 02/04/2021 FINAL  Final   Organism ID, Bacteria PSEUDOMONAS AERUGINOSA  Final   Organism ID, Bacteria KLEBSIELLA PNEUMONIAE  Final      Susceptibility   Klebsiella pneumoniae - MIC*    AMPICILLIN >=32 RESISTANT Resistant     CEFAZOLIN >=64 RESISTANT Resistant     CEFEPIME >=32 RESISTANT Resistant     CEFTAZIDIME >=64 RESISTANT Resistant     CEFTRIAXONE >=64 RESISTANT Resistant     CIPROFLOXACIN >=4 RESISTANT Resistant     GENTAMICIN >=16 RESISTANT Resistant     IMIPENEM <=0.25 SENSITIVE Sensitive     TRIMETH/SULFA >=320 RESISTANT Resistant     AMPICILLIN/SULBACTAM >=32 RESISTANT Resistant     PIP/TAZO >=128 RESISTANT Resistant     * ABUNDANT KLEBSIELLA PNEUMONIAE   Pseudomonas aeruginosa - MIC*    CEFTAZIDIME 4 SENSITIVE Sensitive     CIPROFLOXACIN 2 INTERMEDIATE Intermediate     GENTAMICIN 4 SENSITIVE Sensitive     IMIPENEM 2 SENSITIVE Sensitive     PIP/TAZO 16 SENSITIVE Sensitive     * ABUNDANT PSEUDOMONAS AERUGINOSA  Culture, blood (routine x 2)     Status: Abnormal   Collection Time: 02/04/21  6:10 PM   Specimen: BLOOD RIGHT  HAND  Result Value Ref Range Status   Specimen Description BLOOD RIGHT HAND  Final   Special Requests   Final    BOTTLES DRAWN AEROBIC AND ANAEROBIC Blood Culture adequate volume   Culture  Setup Time (A)  Final    GRAM VARIABLE ROD IN BOTH AEROBIC AND ANAEROBIC BOTTLES CRITICAL RESULT CALLED TO, READ BACK BY AND VERIFIED WITH: T,ROBINSON RN  02/06/21 EB    Culture (A)  Final    LACTOBACILLUS CASEI Standardized susceptibility testing for this organism is not available. Performed at [facility], [address], [place], [place] [address]    Report Status 02/08/2021 FINAL  Final  Blood Culture ID Panel (Reflexed)     Status: None   Collection Time: 02/04/21  6:10 PM  Result Value Ref Range Status   Enterococcus faecalis NOT DETECTED NOT DETECTED Final   Enterococcus Faecium NOT DETECTED NOT DETECTED Final   Listeria monocytogenes NOT DETECTED NOT DETECTED Final   Staphylococcus species NOT DETECTED NOT DETECTED Final   Staphylococcus aureus (BCID) NOT DETECTED NOT DETECTED Final   Staphylococcus epidermidis NOT DETECTED NOT DETECTED Final   Staphylococcus lugdunensis NOT DETECTED NOT DETECTED Final   Streptococcus species NOT DETECTED NOT DETECTED Final   Streptococcus agalactiae NOT DETECTED NOT DETECTED Final   Streptococcus pneumoniae NOT DETECTED NOT DETECTED Final   Streptococcus pyogenes NOT DETECTED NOT DETECTED Final   A.calcoaceticus-baumannii NOT DETECTED NOT DETECTED Final   Bacteroides fragilis NOT DETECTED NOT DETECTED Final  Enterobacterales NOT DETECTED NOT DETECTED Final   Enterobacter cloacae complex NOT DETECTED NOT DETECTED Final   Escherichia coli NOT DETECTED NOT DETECTED Final   Klebsiella aerogenes NOT DETECTED NOT DETECTED Final   Klebsiella oxytoca NOT DETECTED NOT DETECTED Final   Klebsiella pneumoniae NOT DETECTED NOT DETECTED Final   Proteus species NOT DETECTED NOT DETECTED Final   Salmonella species NOT DETECTED NOT DETECTED Final    Serratia marcescens NOT DETECTED NOT DETECTED Final   Haemophilus influenzae NOT DETECTED NOT DETECTED Final   Neisseria meningitidis NOT DETECTED NOT DETECTED Final   Pseudomonas aeruginosa NOT DETECTED NOT DETECTED Final   Stenotrophomonas maltophilia NOT DETECTED NOT DETECTED Final   Candida albicans NOT DETECTED NOT DETECTED Final   Candida auris NOT DETECTED NOT DETECTED Final   Candida glabrata NOT DETECTED NOT DETECTED Final   Candida krusei NOT DETECTED NOT DETECTED Final   Candida parapsilosis NOT DETECTED NOT DETECTED Final   Candida tropicalis NOT DETECTED NOT DETECTED Final   Cryptococcus neoformans/gattii NOT DETECTED NOT DETECTED Final    Comment: Performed at [facility], [address], [place], [place] [address]  Culture, blood (routine x 2)     Status: Abnormal   Collection Time: 02/04/21  6:25 PM   Specimen: BLOOD  Result Value Ref Range Status   Specimen Description BLOOD RIGHT ANTECUBITAL  Final   Special Requests   Final    BOTTLES DRAWN AEROBIC AND ANAEROBIC Blood Culture adequate volume   Culture  Setup Time (A)  Final    GRAM VARIABLE ROD IN BOTH AEROBIC AND ANAEROBIC BOTTLES    Culture (A)  Final    LACTOBACILLUS CASEI Standardized susceptibility testing for this organism is not available. Performed at [facility], [address] [place], [place] [address]    Report Status 02/08/2021 FINAL  Final  Culture, blood (routine x 2)     Status: Abnormal   Collection Time: 02/08/21  3:43 PM   Specimen: BLOOD  Result Value Ref Range Status   Specimen Description BLOOD BLOOD RIGHT HAND  Final   Special Requests   Final    BOTTLES DRAWN AEROBIC AND ANAEROBIC Blood Culture adequate volume   Culture  Setup Time   Final    GRAM POSITIVE RODS IN BOTH AEROBIC AND ANAEROBIC BOTTLES CRITICAL RESULT CALLED TO, READ BACK BY AND VERIFIED WITH: C NESKEO,RN@0622  02/11/21 MKELLY    Culture (A)  Final    LACTOBACILLUS SPECIES Standardized susceptibility  testing for this organism is not available. Performed at [facility], [address], [place], [place] [address]    Report Status 02/13/2021 FINAL  Final  Culture, blood (routine x 2)     Status: Abnormal   Collection Time: 02/08/21  3:43 PM   Specimen: BLOOD LEFT HAND  Result Value Ref Range Status   Specimen Description BLOOD LEFT HAND  Final   Special Requests   Final    BOTTLES DRAWN AEROBIC AND ANAEROBIC Blood Culture adequate volume   Culture  Setup Time (A)  Final    GRAM VARIABLE ROD IN BOTH AEROBIC AND ANAEROBIC BOTTLES CRITICAL VALUE NOTED.  VALUE IS CONSISTENT WITH PREVIOUSLY REPORTED AND CALLED VALUE.    Culture (A)  Final    LACTOBACILLUS SPECIES Standardized susceptibility testing for this organism is not available. Performed at [facility], [address], [place], [place] [address]    Report Status 02/13/2021 FINAL  Final  Culture, blood (routine x 2)  Status: None   Collection Time: 02/16/21 12:24 PM   Specimen: BLOOD  Result Value Ref Range Status   Specimen Description BLOOD LEFT ANTECUBITAL  Final   Special Requests   Final    BOTTLES DRAWN AEROBIC AND ANAEROBIC Blood Culture adequate volume   Culture   Final    NO GROWTH 5 DAYS Performed at [facility], [address] [place], [place] [address]    Report Status 02/21/2021 FINAL  Final  Culture, blood (routine x 2)     Status: None   Collection Time: 02/16/21 12:29 PM   Specimen: BLOOD  Result Value Ref Range Status   Specimen Description BLOOD RIGHT ANTECUBITAL  Final   Special Requests   Final    BOTTLES DRAWN AEROBIC AND ANAEROBIC Blood Culture adequate volume   Culture   Final    NO GROWTH 5 DAYS Performed at [facility], [address] [place], [place] [address]    Report Status 02/21/2021 FINAL  Final  Urine Culture     Status: Abnormal   Collection Time: 02/18/21  3:45 PM   Specimen: In/Out Cath Urine  Result Value Ref Range Status   Specimen Description  IN/OUT CATH URINE  Final   Special Requests   Final    NONE Performed at [facility], [address] [place], [place] [address]    Culture MULTIPLE SPECIES PRESENT, SUGGEST RECOLLECTION (A)  Final   Report Status 02/20/2021 FINAL  Final    Coagulation Studies: No results for input(s): LABPROT, INR in the last 72 hours.  Urinalysis: No results for input(s): COLORURINE, LABSPEC, PHURINE, GLUCOSEU, HGBUR, BILIRUBINUR, KETONESUR, PROTEINUR, UROBILINOGEN, NITRITE, LEUKOCYTESUR in the last 72 hours.  Invalid input(s): APPERANCEUR     Imaging: No results found.   Medications:       Assessment/ Plan:  65 y.o. male with a PMHx of ESRD on HD TTS, anemia of chronic kidney disease, hypertension, acute respiratory failure status post tracheostomy placement, seizure disorder, hyperlipidemia, diabetes mellitus type 2, history of hyperkalemia, history of coronary artery disease, peripheral vascular disease who was admitted to [facility] on 11/21/2020 for ongoing treatment of acute respiratory failure status post tracheostomy placement and ESRD.   1.  ESRD on HD.   Continue dialysis on MWF schedule for the moment.  2.  Acute respiratory failure.  Patient continues to have a significant amount of secretions coming out of his T-piece.  Continue to monitor respiratory status closely.  3.  Anemia of chronic kidney disease.  Hemoglobin down to 6.5.  Transfusion ordered.  4.  Secondary hyperparathyroidism.  Continue to monitor serum phosphorus as it is trending down.  5.  Fever/sepsis.  Patient with recent history of lactobacillus sepsis.  Continues to have WBC count of 24.3.  Has recurrent aspiration as well.   LOS: 0 Joel Hebert 8/17/20223:17 PM

## 2021-02-24 LAB — TYPE AND SCREEN
ABO/RH(D): O POS
Antibody Screen: NEGATIVE
Unit division: 0

## 2021-02-24 LAB — BPAM RBC
Blood Product Expiration Date: 202209202359
ISSUE DATE / TIME: 202208171033
Unit Type and Rh: 5100

## 2021-02-24 LAB — CBC
HCT: 20 % — ABNORMAL LOW (ref 39.0–52.0)
Hemoglobin: 6.5 g/dL — CL (ref 13.0–17.0)
MCH: 29.3 pg (ref 26.0–34.0)
MCHC: 32.5 g/dL (ref 30.0–36.0)
MCV: 90.1 fL (ref 80.0–100.0)
Platelets: 491 10*3/uL — ABNORMAL HIGH (ref 150–400)
RBC: 2.22 MIL/uL — ABNORMAL LOW (ref 4.22–5.81)
RDW: 16.9 % — ABNORMAL HIGH (ref 11.5–15.5)
WBC: 24.3 10*3/uL — ABNORMAL HIGH (ref 4.0–10.5)
nRBC: 0 % (ref 0.0–0.2)

## 2021-02-25 LAB — CBC
HCT: 21.7 % — ABNORMAL LOW (ref 39.0–52.0)
Hemoglobin: 7 g/dL — ABNORMAL LOW (ref 13.0–17.0)
MCH: 29.5 pg (ref 26.0–34.0)
MCHC: 32.3 g/dL (ref 30.0–36.0)
MCV: 91.6 fL (ref 80.0–100.0)
Platelets: 428 10*3/uL — ABNORMAL HIGH (ref 150–400)
RBC: 2.37 MIL/uL — ABNORMAL LOW (ref 4.22–5.81)
RDW: 16.5 % — ABNORMAL HIGH (ref 11.5–15.5)
WBC: 22.2 10*3/uL — ABNORMAL HIGH (ref 4.0–10.5)
nRBC: 0 % (ref 0.0–0.2)

## 2021-02-25 LAB — RENAL FUNCTION PANEL
Albumin: 1.8 g/dL — ABNORMAL LOW (ref 3.5–5.0)
Anion gap: 16 — ABNORMAL HIGH (ref 5–15)
BUN: 111 mg/dL — ABNORMAL HIGH (ref 8–23)
CO2: 25 mmol/L (ref 22–32)
Calcium: 9.8 mg/dL (ref 8.9–10.3)
Chloride: 92 mmol/L — ABNORMAL LOW (ref 98–111)
Creatinine, Ser: 3.63 mg/dL — ABNORMAL HIGH (ref 0.61–1.24)
GFR, Estimated: 18 mL/min — ABNORMAL LOW (ref >60)
Glucose, Bld: 102 mg/dL — ABNORMAL HIGH (ref 70–99)
Phosphorus: 3.3 mg/dL (ref 2.5–4.6)
Potassium: 5.1 mmol/L (ref 3.5–5.1)
Sodium: 133 mmol/L — ABNORMAL LOW (ref 135–145)

## 2021-02-25 NOTE — Progress Notes (Signed)
[facility]  ROUNDING NOTE   Subjective:  Patient sitting up in chair this AM. He continues to have myoclonic facial twitches. Not following any commands. Due for dialysis today.   Objective:  Vital signs in last 24 hours:  Temperature 97.5 pulse 94 respirations 41 blood pressure 130/56  Physical Exam: General:  No acute distress  Head:  Normocephalic, atraumatic. 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  No right lower extremity edema  Neurologic:  Myoclonic facial twitching noted  Skin:  No acute rash  Access:  Right IJ temporary dialysis catheter    Basic Metabolic Panel: Recent Labs  Lab 02/19/21 1109 02/21/21 0733 02/22/21 1042 02/23/21 0515 02/25/21 0436  NA  --  134*  --  136 133*  K 4.4 5.3* 4.0 5.0 5.1  CL  --  96*  --  97* 92*  CO2  --  23  --  24 25  GLUCOSE  --  143*  --  150* 102*  BUN  --  109*  --  104* 111*  CREATININE  --  4.19*  --  3.64* 3.63*  CALCIUM  --  9.9  --  9.7 9.8  PHOS  --  3.7  --  2.3* 3.3     Liver Function Tests: Recent Labs  Lab 02/21/21 0733 02/23/21 0515 02/25/21 0436  ALBUMIN 1.6* 1.8* 1.8*    No results for input(s): LIPASE, AMYLASE in the last 168 hours. No results for input(s): AMMONIA in the last 168 hours.  CBC: Recent Labs  Lab 02/21/21 0733 02/23/21 0654 02/25/21 0436  WBC 25.9* 24.3* 22.2*  HGB 7.2* 6.5* 7.0*  HCT 22.7* 20.0* 21.7*  MCV 91.2 90.1 91.6  PLT 549* 491* 428*     Cardiac Enzymes: No results for input(s): CKTOTAL, CKMB, CKMBINDEX, TROPONINI in the last 168 hours.  BNP: Invalid input(s): POCBNP  CBG: No results for input(s): GLUCAP in the last 168 hours.  Microbiology: Results for orders placed or performed during the hospital encounter of 11/21/20  Culture, blood (routine x 2)     Status:  None   Collection Time: 11/25/20  3:25 PM   Specimen: BLOOD RIGHT HAND  Result Value Ref Range Status   Specimen Description BLOOD RIGHT HAND  Final   Special Requests   Final    BOTTLES DRAWN AEROBIC ONLY Blood Culture results may not be optimal due to an inadequate volume of blood received in culture bottles   Culture   Final    NO GROWTH 5 DAYS Performed at [facility], [address] [place], [place] [address]    Report Status 11/30/2020 FINAL  Final  Culture, blood (routine x 2)     Status: None   Collection Time: 11/25/20  3:29 PM   Specimen: BLOOD LEFT HAND  Result Value Ref Range Status   Specimen Description BLOOD LEFT HAND  Final   Special Requests   Final    BOTTLES DRAWN AEROBIC AND ANAEROBIC Blood Culture adequate volume   Culture   Final    NO GROWTH 5 DAYS Performed at [facility], [address] [place], [place] [address]    Report Status 11/30/2020 FINAL  Final  Culture, Respiratory w Gram Stain     Status: None  Collection Time: 12/06/20 10:40 AM   Specimen: Tracheal Aspirate; Respiratory  Result Value Ref Range Status   Specimen Description TRACHEAL ASPIRATE  Final   Special Requests NONE  Final   Gram Stain   Final    ABUNDANT WBC PRESENT, PREDOMINANTLY PMN ABUNDANT GRAM NEGATIVE RODS FEW GRAM POSITIVE COCCI FEW GRAM POSITIVE RODS    Culture   Final    MODERATE PSEUDOMONAS AERUGINOSA Two isolates with different morphologies were identified as the same organism.The most resistant organism was reported. Performed at [facility], [address] [place], [place] [address]    Report Status 12/09/2020 FINAL  Final   Organism ID, Bacteria PSEUDOMONAS AERUGINOSA  Final      Susceptibility   Pseudomonas aeruginosa - MIC*    CEFTAZIDIME 16 INTERMEDIATE Intermediate     CIPROFLOXACIN 1 SENSITIVE Sensitive     GENTAMICIN 2 SENSITIVE Sensitive     IMIPENEM >=16 RESISTANT Resistant     * MODERATE PSEUDOMONAS AERUGINOSA  Culture, blood  (routine x 2)     Status: None   Collection Time: 12/13/20  9:09 AM   Specimen: BLOOD LEFT HAND  Result Value Ref Range Status   Specimen Description BLOOD LEFT HAND  Final   Special Requests   Final    BOTTLES DRAWN AEROBIC AND ANAEROBIC Blood Culture results may not be optimal due to an inadequate volume of blood received in culture bottles   Culture   Final    NO GROWTH 5 DAYS Performed at [facility], [address] [place], [place] [address]    Report Status 12/18/2020 FINAL  Final  Culture, blood (routine x 2)     Status: Abnormal   Collection Time: 12/13/20  9:13 AM   Specimen: BLOOD  Result Value Ref Range Status   Specimen Description BLOOD RIGHT ANTECUBITAL  Final   Special Requests   Final    BOTTLES DRAWN AEROBIC AND ANAEROBIC Blood Culture adequate volume   Culture  Setup Time   Final    GRAM POSITIVE COCCI IN CLUSTERS AEROBIC BOTTLE ONLY CRITICAL RESULT CALLED TO, READ BACK BY AND VERIFIED WITH: RN C.ROBENSON AT 1234 ON 12/14/2020 BY T.SAAD.    Culture (A)  Final    STAPHYLOCOCCUS EPIDERMIDIS THE SIGNIFICANCE OF ISOLATING THIS ORGANISM FROM A SINGLE SET OF BLOOD CULTURES WHEN MULTIPLE SETS ARE DRAWN IS UNCERTAIN. PLEASE NOTIFY THE MICROBIOLOGY DEPARTMENT WITHIN ONE WEEK IF SPECIATION AND SENSITIVITIES ARE REQUIRED. Performed at [facility], [address] [place], [place] [address]    Report Status 12/16/2020 FINAL  Final  Blood Culture ID Panel (Reflexed)     Status: Abnormal   Collection Time: 12/13/20  9:13 AM  Result Value Ref Range Status   Enterococcus faecalis NOT DETECTED NOT DETECTED Final   Enterococcus Faecium NOT DETECTED NOT DETECTED Final   Listeria monocytogenes NOT DETECTED NOT DETECTED Final   Staphylococcus species DETECTED (A) NOT DETECTED Final    Comment: CRITICAL RESULT CALLED TO, READ BACK BY AND VERIFIED WITH: RN C.ROBERTSON AT 1234 ON 12/14/2020 BY T.SAAD.    Staphylococcus aureus (BCID) NOT DETECTED NOT DETECTED Final    Staphylococcus epidermidis DETECTED (A) NOT DETECTED Final    Comment: Methicillin (oxacillin) resistant coagulase negative staphylococcus. Possible blood culture contaminant (unless isolated from more than one blood culture draw or clinical case suggests pathogenicity). No antibiotic treatment is indicated for blood  culture contaminants. CRITICAL RESULT CALLED TO, READ BACK BY AND VERIFIED WITH: RN C.ROBERTSON AT  1234 ON 12/14/2020 BY T.SAAD.    Staphylococcus lugdunensis NOT DETECTED NOT DETECTED Final   Streptococcus species NOT DETECTED NOT DETECTED Final   Streptococcus agalactiae NOT DETECTED NOT DETECTED Final   Streptococcus pneumoniae NOT DETECTED NOT DETECTED Final   Streptococcus pyogenes NOT DETECTED NOT DETECTED Final   A.calcoaceticus-baumannii NOT DETECTED NOT DETECTED Final   Bacteroides fragilis NOT DETECTED NOT DETECTED Final   Enterobacterales NOT DETECTED NOT DETECTED Final   Enterobacter cloacae complex NOT DETECTED NOT DETECTED Final   Escherichia coli NOT DETECTED NOT DETECTED Final   Klebsiella aerogenes NOT DETECTED NOT DETECTED Final   Klebsiella oxytoca NOT DETECTED NOT DETECTED Final   Klebsiella pneumoniae NOT DETECTED NOT DETECTED Final   Proteus species NOT DETECTED NOT DETECTED Final   Salmonella species NOT DETECTED NOT DETECTED Final   Serratia marcescens NOT DETECTED NOT DETECTED Final   Haemophilus influenzae NOT DETECTED NOT DETECTED Final   Neisseria meningitidis NOT DETECTED NOT DETECTED Final   Pseudomonas aeruginosa NOT DETECTED NOT DETECTED Final   Stenotrophomonas maltophilia NOT DETECTED NOT DETECTED Final   Candida albicans NOT DETECTED NOT DETECTED Final   Candida auris NOT DETECTED NOT DETECTED Final   Candida glabrata NOT DETECTED NOT DETECTED Final   Candida krusei NOT DETECTED NOT DETECTED Final   Candida parapsilosis NOT DETECTED NOT DETECTED Final   Candida tropicalis NOT DETECTED NOT DETECTED Final   Cryptococcus neoformans/gattii  NOT DETECTED NOT DETECTED Final   Methicillin resistance mecA/C DETECTED (A) NOT DETECTED Final    Comment: CRITICAL RESULT CALLED TO, READ BACK BY AND VERIFIED WITH: RN C.ROBERTSON AT 1234 ON 12/14/2020 BY T.SAAD. Performed at [facility], [address], [place], [place] [address]   Culture, blood (routine x 2)     Status: None   Collection Time: 12/17/20 11:31 AM   Specimen: BLOOD  Result Value Ref Range Status   Specimen Description BLOOD RIGHT ANTECUBITAL  Final   Special Requests   Final    BOTTLES DRAWN AEROBIC ONLY Blood Culture results may not be optimal due to an inadequate volume of blood received in culture bottles   Culture   Final    NO GROWTH 5 DAYS Performed at [facility], [address], [place], [place] [address]    Report Status 12/22/2020 FINAL  Final  Culture, blood (routine x 2)     Status: None   Collection Time: 12/17/20 11:38 AM   Specimen: BLOOD LEFT HAND  Result Value Ref Range Status   Specimen Description BLOOD LEFT HAND  Final   Special Requests   Final    BOTTLES DRAWN AEROBIC ONLY Blood Culture adequate volume   Culture   Final    NO GROWTH 5 DAYS Performed at [facility], [address] [place], [place] [address]    Report Status 12/22/2020 FINAL  Final  Culture, blood (routine x 2)     Status: None   Collection Time: 12/29/20  8:10 AM   Specimen: BLOOD RIGHT HAND  Result Value Ref Range Status   Specimen Description BLOOD RIGHT HAND  Final   Special Requests   Final    BOTTLES DRAWN AEROBIC AND ANAEROBIC Blood Culture results may not be optimal due to an inadequate volume of blood received in culture bottles   Culture   Final    NO GROWTH 5 DAYS Performed at [facility], [address], [place], [place] [address]    Report Status 01/03/2021 FINAL  Final  Culture, blood (routine x 2)     Status: None   Collection Time: 12/29/20  8:17 AM   Specimen: BLOOD RIGHT HAND  Result Value Ref Range Status   Specimen  Description BLOOD RIGHT HAND  Final   Special Requests   Final    BOTTLES DRAWN AEROBIC ONLY Blood Culture results may not be optimal due to an inadequate volume of blood received in culture bottles   Culture   Final    NO GROWTH 5 DAYS Performed at [facility], [address] [place], [place] [address]    Report Status 01/03/2021 FINAL  Final  Culture, Respiratory w Gram Stain     Status: None   Collection Time: 01/01/21 11:54 AM   Specimen: Tracheal Aspirate; Respiratory  Result Value Ref Range Status   Specimen Description TRACHEAL ASPIRATE  Final   Special Requests NONE  Final   Gram Stain   Final    RARE WBC PRESENT,BOTH PMN AND MONONUCLEAR FEW GRAM NEGATIVE RODS Performed at [facility], [address], [place], [place] [address]    Culture MODERATE PSEUDOMONAS AERUGINOSA  Final   Report Status 01/03/2021 FINAL  Final   Organism ID, Bacteria PSEUDOMONAS AERUGINOSA  Final      Susceptibility   Pseudomonas aeruginosa - MIC*    CEFTAZIDIME 4 SENSITIVE Sensitive     CIPROFLOXACIN 1 SENSITIVE Sensitive     GENTAMICIN <=1 SENSITIVE Sensitive     IMIPENEM 2 SENSITIVE Sensitive     PIP/TAZO <=4 SENSITIVE Sensitive     CEFEPIME 2 SENSITIVE Sensitive     * MODERATE PSEUDOMONAS AERUGINOSA  Culture, blood (routine x 2)     Status: None   Collection Time: 01/04/21 11:02 AM   Specimen: BLOOD RIGHT HAND  Result Value Ref Range Status   Specimen Description BLOOD RIGHT HAND  Final   Special Requests   Final    BOTTLES DRAWN AEROBIC AND ANAEROBIC Blood Culture adequate volume   Culture   Final    NO GROWTH 5 DAYS Performed at [facility], [address], [place], [place] [address]    Report Status 01/09/2021 FINAL  Final  Culture, blood (routine x 2)     Status: Abnormal   Collection Time: 01/04/21 11:11 AM   Specimen: BLOOD RIGHT FOREARM  Result Value Ref Range Status   Specimen Description BLOOD RIGHT FOREARM  Final   Special Requests   Final    BOTTLES  DRAWN AEROBIC AND ANAEROBIC Blood Culture results may not be optimal due to an inadequate volume of blood received in culture bottles   Culture  Setup Time   Final    GRAM POSITIVE COCCI IN CLUSTERS ANAEROBIC BOTTLE ONLY CRITICAL RESULT CALLED TO, READ BACK BY AND VERIFIED WITH: RN D [place] [id number] AT 1003 BY CM    Culture (A)  Final    STAPHYLOCOCCUS EPIDERMIDIS THE SIGNIFICANCE OF ISOLATING THIS ORGANISM FROM A SINGLE SET OF BLOOD CULTURES WHEN MULTIPLE SETS ARE DRAWN IS UNCERTAIN. PLEASE NOTIFY THE MICROBIOLOGY DEPARTMENT WITHIN ONE WEEK IF SPECIATION AND SENSITIVITIES ARE REQUIRED. Performed at [facility], [address], [place], [place] [address]    Report Status 01/07/2021 FINAL  Final  Culture, blood (routine x 2)     Status: None   Collection Time: 01/09/21  5:00 PM   Specimen: BLOOD  Result Value Ref Range Status   Specimen Description BLOOD RIGHT ANTECUBITAL  Final   Special Requests  Final    BOTTLES DRAWN AEROBIC AND ANAEROBIC Blood Culture results may not be optimal due to an inadequate volume of blood received in culture bottles   Culture   Final    NO GROWTH 5 DAYS Performed at [facility], [address], [place], [place] [address]    Report Status 01/14/2021 FINAL  Final  Culture, blood (routine x 2)     Status: Abnormal   Collection Time: 01/09/21  5:04 PM   Specimen: BLOOD  Result Value Ref Range Status   Specimen Description BLOOD RIGHT ANTECUBITAL  Final   Special Requests   Final    BOTTLES DRAWN AEROBIC ONLY Blood Culture results may not be optimal due to an inadequate volume of blood received in culture bottles   Culture  Setup Time   Final    GRAM POSITIVE COCCI IN CLUSTERS AEROBIC BOTTLE ONLY CRITICAL VALUE NOTED.  VALUE IS CONSISTENT WITH PREVIOUSLY REPORTED AND CALLED VALUE.    Culture (A)  Final    STAPHYLOCOCCUS CAPITIS THE SIGNIFICANCE OF ISOLATING THIS ORGANISM FROM A SINGLE SET OF BLOOD CULTURES WHEN MULTIPLE SETS ARE DRAWN  IS UNCERTAIN. PLEASE NOTIFY THE MICROBIOLOGY DEPARTMENT WITHIN ONE WEEK IF SPECIATION AND SENSITIVITIES ARE REQUIRED. Performed at [facility], [address], [place], [place] [address]    Report Status 01/11/2021 FINAL  Final  Culture, Respiratory w Gram Stain     Status: None   Collection Time: 01/14/21  6:04 PM   Specimen: Tracheal Aspirate; Respiratory  Result Value Ref Range Status   Specimen Description TRACHEAL ASPIRATE  Final   Special Requests NONE  Final   Gram Stain   Final    RARE WBC PRESENT,BOTH PMN AND MONONUCLEAR FEW SQUAMOUS EPITHELIAL CELLS PRESENT FEW GRAM NEGATIVE RODS RARE GRAM POSITIVE COCCI Performed at [facility], [address] [place], [place] [address]    Culture MODERATE PSEUDOMONAS AERUGINOSA  Final   Report Status 01/18/2021 FINAL  Final   Organism ID, Bacteria PSEUDOMONAS AERUGINOSA  Final      Susceptibility   Pseudomonas aeruginosa - MIC*    CEFTAZIDIME 4 SENSITIVE Sensitive     CIPROFLOXACIN 2 INTERMEDIATE Intermediate     GENTAMICIN 4 SENSITIVE Sensitive     IMIPENEM >=16 RESISTANT Resistant     CEFEPIME 4 SENSITIVE Sensitive     * MODERATE PSEUDOMONAS AERUGINOSA  Culture, blood (routine x 2)     Status: None   Collection Time: 01/24/21  2:20 PM   Specimen: BLOOD LEFT HAND  Result Value Ref Range Status   Specimen Description BLOOD LEFT HAND  Final   Special Requests   Final    BOTTLES DRAWN AEROBIC AND ANAEROBIC Blood Culture adequate volume   Culture   Final    NO GROWTH 5 DAYS Performed at [facility], [address] [place], [place] [address]    Report Status 01/29/2021 FINAL  Final  Culture, blood (routine x 2)     Status: None   Collection Time: 01/24/21  2:24 PM   Specimen: BLOOD LEFT HAND  Result Value Ref Range Status   Specimen Description BLOOD LEFT HAND  Final   Special Requests   Final    BOTTLES DRAWN AEROBIC AND ANAEROBIC Blood Culture results may not be optimal due to an inadequate volume of  blood received in culture bottles   Culture   Final    NO GROWTH 5 DAYS Performed at [facility], [address], [place], [place] [address]    Report Status 01/29/2021 FINAL  Final  Culture, Respiratory w Gram Stain     Status: None   Collection Time: 02/01/21 12:02 PM   Specimen: Tracheal Aspirate; Respiratory  Result Value Ref Range Status   Specimen Description TRACHEAL ASPIRATE  Final   Special Requests NONE  Final   Gram Stain   Final    NO WBC SEEN FEW GRAM VARIABLE ROD Performed at [facility], [address], [place], [place] [address]    Culture   Final    ABUNDANT PSEUDOMONAS AERUGINOSA ABUNDANT KLEBSIELLA PNEUMONIAE Confirmed Extended Spectrum Beta-Lactamase Producer (ESBL).  In bloodstream infections from ESBL organisms, carbapenems are preferred over piperacillin/tazobactam. They are shown to have a lower risk of mortality.    Report Status 02/04/2021 FINAL  Final   Organism ID, Bacteria PSEUDOMONAS AERUGINOSA  Final   Organism ID, Bacteria KLEBSIELLA PNEUMONIAE  Final      Susceptibility   Klebsiella pneumoniae - MIC*    AMPICILLIN >=32 RESISTANT Resistant     CEFAZOLIN >=64 RESISTANT Resistant     CEFEPIME >=32 RESISTANT Resistant     CEFTAZIDIME >=64 RESISTANT Resistant     CEFTRIAXONE >=64 RESISTANT Resistant     CIPROFLOXACIN >=4 RESISTANT Resistant     GENTAMICIN >=16 RESISTANT Resistant     IMIPENEM <=0.25 SENSITIVE Sensitive     TRIMETH/SULFA >=320 RESISTANT Resistant     AMPICILLIN/SULBACTAM >=32 RESISTANT Resistant     PIP/TAZO >=128 RESISTANT Resistant     * ABUNDANT KLEBSIELLA PNEUMONIAE   Pseudomonas aeruginosa - MIC*    CEFTAZIDIME 4 SENSITIVE Sensitive     CIPROFLOXACIN 2 INTERMEDIATE Intermediate     GENTAMICIN 4 SENSITIVE Sensitive     IMIPENEM 2 SENSITIVE Sensitive     PIP/TAZO 16 SENSITIVE Sensitive     * ABUNDANT PSEUDOMONAS AERUGINOSA  Culture, blood (routine x 2)     Status: Abnormal   Collection Time: 02/04/21  6:10 PM    Specimen: BLOOD RIGHT HAND  Result Value Ref Range Status   Specimen Description BLOOD RIGHT HAND  Final   Special Requests   Final    BOTTLES DRAWN AEROBIC AND ANAEROBIC Blood Culture adequate volume   Culture  Setup Time (A)  Final    GRAM VARIABLE ROD IN BOTH AEROBIC AND ANAEROBIC BOTTLES CRITICAL RESULT CALLED TO, READ BACK BY AND VERIFIED WITH: T,ROBINSON RN @1507  02/06/21 EB    Culture (A)  Final    LACTOBACILLUS CASEI Standardized susceptibility testing for this organism is not available. Performed at [facility], [address] [place], [place]    Report Status 02/08/2021 FINAL  Final  Blood Culture ID Panel (Reflexed)     Status: None   Collection Time: 02/04/21  6:10 PM  Result Value Ref Range Status   Enterococcus faecalis NOT DETECTED NOT DETECTED Final   Enterococcus Faecium NOT DETECTED NOT DETECTED Final   Listeria monocytogenes NOT DETECTED NOT DETECTED Final   Staphylococcus species NOT DETECTED NOT DETECTED Final   Staphylococcus aureus (BCID) NOT DETECTED NOT DETECTED Final   Staphylococcus epidermidis NOT DETECTED NOT DETECTED Final   Staphylococcus lugdunensis NOT DETECTED NOT DETECTED Final   Streptococcus species NOT DETECTED NOT DETECTED Final   Streptococcus agalactiae NOT DETECTED NOT DETECTED Final   Streptococcus pneumoniae NOT DETECTED NOT DETECTED Final   Streptococcus pyogenes NOT DETECTED NOT DETECTED Final   A.calcoaceticus-baumannii NOT DETECTED NOT DETECTED Final   Bacteroides fragilis NOT  DETECTED NOT DETECTED Final   Enterobacterales NOT DETECTED NOT DETECTED Final   Enterobacter cloacae complex NOT DETECTED NOT DETECTED Final   Escherichia coli NOT DETECTED NOT DETECTED Final   Klebsiella aerogenes NOT DETECTED NOT DETECTED Final   Klebsiella oxytoca NOT DETECTED NOT DETECTED Final   Klebsiella pneumoniae NOT DETECTED NOT DETECTED Final   Proteus species NOT DETECTED NOT DETECTED Final   Salmonella species NOT DETECTED  NOT DETECTED Final   Serratia marcescens NOT DETECTED NOT DETECTED Final   Haemophilus influenzae NOT DETECTED NOT DETECTED Final   Neisseria meningitidis NOT DETECTED NOT DETECTED Final   Pseudomonas aeruginosa NOT DETECTED NOT DETECTED Final   Stenotrophomonas maltophilia NOT DETECTED NOT DETECTED Final   Candida albicans NOT DETECTED NOT DETECTED Final   Candida auris NOT DETECTED NOT DETECTED Final   Candida glabrata NOT DETECTED NOT DETECTED Final   Candida krusei NOT DETECTED NOT DETECTED Final   Candida parapsilosis NOT DETECTED NOT DETECTED Final   Candida tropicalis NOT DETECTED NOT DETECTED Final   Cryptococcus neoformans/gattii NOT DETECTED NOT DETECTED Final    Comment: Performed at [facility], [address], [place], [place] [address]  Culture, blood (routine x 2)     Status: Abnormal   Collection Time: 02/04/21  6:25 PM   Specimen: BLOOD  Result Value Ref Range Status   Specimen Description BLOOD RIGHT ANTECUBITAL  Final   Special Requests   Final    BOTTLES DRAWN AEROBIC AND ANAEROBIC Blood Culture adequate volume   Culture  Setup Time (A)  Final    GRAM VARIABLE ROD IN BOTH AEROBIC AND ANAEROBIC BOTTLES    Culture (A)  Final    LACTOBACILLUS CASEI Standardized susceptibility testing for this organism is not available. Performed at [facility], [address], [place], [place] [address]    Report Status 02/08/2021 FINAL  Final  Culture, blood (routine x 2)     Status: Abnormal   Collection Time: 02/08/21  3:43 PM   Specimen: BLOOD  Result Value Ref Range Status   Specimen Description BLOOD BLOOD RIGHT HAND  Final   Special Requests   Final    BOTTLES DRAWN AEROBIC AND ANAEROBIC Blood Culture adequate volume   Culture  Setup Time   Final    GRAM POSITIVE RODS IN BOTH AEROBIC AND ANAEROBIC BOTTLES CRITICAL RESULT CALLED TO, READ BACK BY AND VERIFIED WITH: C NESKEO,RN@0622  02/11/21 MKELLY    Culture (A)  Final    LACTOBACILLUS  SPECIES Standardized susceptibility testing for this organism is not available. Performed at [facility], [address] [place], [place] [address]    Report Status 02/13/2021 FINAL  Final  Culture, blood (routine x 2)     Status: Abnormal   Collection Time: 02/08/21  3:43 PM   Specimen: BLOOD LEFT HAND  Result Value Ref Range Status   Specimen Description BLOOD LEFT HAND  Final   Special Requests   Final    BOTTLES DRAWN AEROBIC AND ANAEROBIC Blood Culture adequate volume   Culture  Setup Time (A)  Final    GRAM VARIABLE ROD IN BOTH AEROBIC AND ANAEROBIC BOTTLES CRITICAL VALUE NOTED.  VALUE IS CONSISTENT WITH PREVIOUSLY REPORTED AND CALLED VALUE.    Culture (A)  Final    LACTOBACILLUS SPECIES Standardized susceptibility testing for this organism is not available. Performed at [facility], [address], [place], [place] [address]    Report Status 02/13/2021 FINAL  Final  Culture, blood (routine x 2)     Status: None   Collection Time: 02/16/21 12:24 PM   Specimen: BLOOD  Result Value Ref Range Status   Specimen Description BLOOD LEFT ANTECUBITAL  Final   Special Requests   Final    BOTTLES DRAWN AEROBIC AND ANAEROBIC Blood Culture adequate volume   Culture   Final    NO GROWTH 5 DAYS Performed at [facility], [address] [place], [place] [address]    Report Status 02/21/2021 FINAL  Final  Culture, blood (routine x 2)     Status: None   Collection Time: 02/16/21 12:29 PM   Specimen: BLOOD  Result Value Ref Range Status   Specimen Description BLOOD RIGHT ANTECUBITAL  Final   Special Requests   Final    BOTTLES DRAWN AEROBIC AND ANAEROBIC Blood Culture adequate volume   Culture   Final    NO GROWTH 5 DAYS Performed at [facility], [address], [place], [place] [address]    Report Status 02/21/2021 FINAL  Final  Urine Culture     Status: Abnormal   Collection Time: 02/18/21  3:45 PM   Specimen: In/Out Cath Urine  Result Value Ref  Range Status   Specimen Description IN/OUT CATH URINE  Final   Special Requests   Final    NONE Performed at [facility], [address] [place], [place] [address]    Culture MULTIPLE SPECIES PRESENT, SUGGEST RECOLLECTION (A)  Final   Report Status 02/20/2021 FINAL  Final    Coagulation Studies: No results for input(s): LABPROT, INR in the last 72 hours.  Urinalysis: No results for input(s): COLORURINE, LABSPEC, PHURINE, GLUCOSEU, HGBUR, BILIRUBINUR, KETONESUR, PROTEINUR, UROBILINOGEN, NITRITE, LEUKOCYTESUR in the last 72 hours.  Invalid input(s): APPERANCEUR     Imaging: No results found.   Medications:       Assessment/ Plan:  65 y.o. male with a PMHx of ESRD on HD TTS, anemia of chronic kidney disease, hypertension, acute respiratory failure status post tracheostomy placement, seizure disorder, hyperlipidemia, diabetes mellitus type 2, history of hyperkalemia, history of coronary artery disease, peripheral vascular disease who was admitted to [facility] on 11/21/2020 for ongoing treatment of acute respiratory failure status post tracheostomy placement and ESRD.   1.  ESRD on HD.   We are planning for hemodialysis treatment today.  We will maintain the patient on MWF dialysis treatment schedule.  2.  Acute respiratory failure.  Monitored by pulmonary/critical care.  3.  Anemia of chronic kidney disease.  Hemoglobin was as low as 6.5 recently and then up to 7 posttransfusion.  4.  Secondary hyperparathyroidism.  Phosphorus acceptable at 3.3.  5.  Fever/sepsis.  WBC count currently 22.2.  Antibiotic management as per hospitalist and infectious disease.   LOS: 0 Arijana Narayan 8/19/20228:50 AM

## 2021-03-10 DEATH — deceased
